# Patient Record
Sex: Female | Born: 1935 | Race: White | Hispanic: No | Smoking: Former smoker
Health system: Southern US, Community
[De-identification: ages and names within clinical notes are randomized; demographics above are authoritative.]

## PROBLEM LIST (undated history)

## (undated) DIAGNOSIS — C539 Malignant neoplasm of cervix uteri, unspecified: Secondary | ICD-10-CM

## (undated) DIAGNOSIS — I5032 Chronic diastolic (congestive) heart failure: Secondary | ICD-10-CM

## (undated) DIAGNOSIS — C50919 Malignant neoplasm of unspecified site of unspecified female breast: Secondary | ICD-10-CM

## (undated) DIAGNOSIS — K56609 Unspecified intestinal obstruction, unspecified as to partial versus complete obstruction: Secondary | ICD-10-CM

## (undated) DIAGNOSIS — C569 Malignant neoplasm of unspecified ovary: Secondary | ICD-10-CM

## (undated) DIAGNOSIS — Z90710 Acquired absence of both cervix and uterus: Secondary | ICD-10-CM

## (undated) DIAGNOSIS — Z8543 Personal history of malignant neoplasm of ovary: Secondary | ICD-10-CM

## (undated) DIAGNOSIS — E039 Hypothyroidism, unspecified: Secondary | ICD-10-CM

## (undated) DIAGNOSIS — C189 Malignant neoplasm of colon, unspecified: Secondary | ICD-10-CM

## (undated) DIAGNOSIS — I10 Essential (primary) hypertension: Secondary | ICD-10-CM

## (undated) DIAGNOSIS — J189 Pneumonia, unspecified organism: Secondary | ICD-10-CM

## (undated) DIAGNOSIS — I4821 Permanent atrial fibrillation: Secondary | ICD-10-CM

## (undated) DIAGNOSIS — C679 Malignant neoplasm of bladder, unspecified: Secondary | ICD-10-CM

## (undated) DIAGNOSIS — Z8551 Personal history of malignant neoplasm of bladder: Secondary | ICD-10-CM

## (undated) DIAGNOSIS — G4733 Obstructive sleep apnea (adult) (pediatric): Secondary | ICD-10-CM

## (undated) DIAGNOSIS — E785 Hyperlipidemia, unspecified: Secondary | ICD-10-CM

## (undated) DIAGNOSIS — E119 Type 2 diabetes mellitus without complications: Secondary | ICD-10-CM

## (undated) DIAGNOSIS — I872 Venous insufficiency (chronic) (peripheral): Secondary | ICD-10-CM

## (undated) DIAGNOSIS — Z8541 Personal history of malignant neoplasm of cervix uteri: Secondary | ICD-10-CM

## (undated) HISTORY — DX: Malignant neoplasm of cervix uteri, unspecified: C53.9

## (undated) HISTORY — PX: BLADDER SURGERY: SHX569

## (undated) HISTORY — DX: Type 2 diabetes mellitus without complications: E11.9

## (undated) HISTORY — PX: MASTECTOMY, RADICAL: SHX710

## (undated) HISTORY — PX: VAGINAL HYSTERECTOMY: SUR661

## (undated) HISTORY — PX: EXPLORATORY LAPAROTOMY WITH ABDOMINAL MASS EXCISION: SHX5169

## (undated) HISTORY — DX: Hypothyroidism, unspecified: E03.9

## (undated) HISTORY — PX: BREAST LUMPECTOMY: SHX2

## (undated) HISTORY — DX: Chronic diastolic (congestive) heart failure: I50.32

## (undated) HISTORY — DX: Hyperlipidemia, unspecified: E78.5

## (undated) HISTORY — PX: CATARACT EXTRACTION W/ INTRAOCULAR LENS  IMPLANT, BILATERAL: SHX1307

## (undated) HISTORY — PX: BREAST BIOPSY: SHX20

## (undated) HISTORY — DX: Malignant neoplasm of unspecified ovary: C56.9

## (undated) HISTORY — DX: Obstructive sleep apnea (adult) (pediatric): G47.33

## (undated) HISTORY — DX: Malignant neoplasm of bladder, unspecified: C67.9

## (undated) HISTORY — DX: Malignant neoplasm of unspecified site of unspecified female breast: C50.919

## (undated) HISTORY — PX: APPENDECTOMY: SHX54

## (undated) HISTORY — DX: Permanent atrial fibrillation: I48.21

## (undated) HISTORY — DX: Venous insufficiency (chronic) (peripheral): I87.2

---

## 1997-10-29 ENCOUNTER — Ambulatory Visit (HOSPITAL_COMMUNITY): Admission: RE | Admit: 1997-10-29 | Discharge: 1997-10-29 | Payer: Self-pay | Admitting: Family Medicine

## 1998-10-12 ENCOUNTER — Other Ambulatory Visit: Admission: RE | Admit: 1998-10-12 | Discharge: 1998-10-12 | Payer: Self-pay | Admitting: Family Medicine

## 1998-10-13 ENCOUNTER — Encounter (HOSPITAL_COMMUNITY): Admission: RE | Admit: 1998-10-13 | Discharge: 1999-01-11 | Payer: Self-pay | Admitting: Family Medicine

## 1998-10-19 ENCOUNTER — Ambulatory Visit (HOSPITAL_COMMUNITY): Admission: RE | Admit: 1998-10-19 | Discharge: 1998-10-19 | Payer: Self-pay | Admitting: Gastroenterology

## 1999-05-16 ENCOUNTER — Ambulatory Visit (HOSPITAL_COMMUNITY): Admission: RE | Admit: 1999-05-16 | Discharge: 1999-05-16 | Payer: Self-pay | Admitting: Gastroenterology

## 1999-11-16 ENCOUNTER — Encounter: Payer: Self-pay | Admitting: Family Medicine

## 1999-11-16 ENCOUNTER — Encounter: Admission: RE | Admit: 1999-11-16 | Discharge: 1999-11-16 | Payer: Self-pay | Admitting: Family Medicine

## 2001-01-09 ENCOUNTER — Encounter: Admission: RE | Admit: 2001-01-09 | Discharge: 2001-01-09 | Payer: Self-pay | Admitting: Family Medicine

## 2001-01-09 ENCOUNTER — Encounter: Payer: Self-pay | Admitting: Family Medicine

## 2003-12-27 ENCOUNTER — Encounter: Admission: RE | Admit: 2003-12-27 | Discharge: 2004-03-26 | Payer: Self-pay | Admitting: Family Medicine

## 2004-01-05 ENCOUNTER — Encounter: Admission: RE | Admit: 2004-01-05 | Discharge: 2004-01-05 | Payer: Self-pay | Admitting: Family Medicine

## 2004-10-28 ENCOUNTER — Ambulatory Visit: Admission: RE | Admit: 2004-10-28 | Discharge: 2004-10-28 | Payer: Self-pay | Admitting: Chiropractic Medicine

## 2005-06-27 ENCOUNTER — Ambulatory Visit (HOSPITAL_COMMUNITY): Admission: RE | Admit: 2005-06-27 | Discharge: 2005-06-27 | Payer: Self-pay | Admitting: Urology

## 2005-06-27 ENCOUNTER — Encounter (INDEPENDENT_AMBULATORY_CARE_PROVIDER_SITE_OTHER): Payer: Self-pay | Admitting: Specialist

## 2005-08-10 ENCOUNTER — Ambulatory Visit (HOSPITAL_BASED_OUTPATIENT_CLINIC_OR_DEPARTMENT_OTHER): Admission: RE | Admit: 2005-08-10 | Discharge: 2005-08-10 | Payer: Self-pay | Admitting: Urology

## 2005-08-10 ENCOUNTER — Encounter (INDEPENDENT_AMBULATORY_CARE_PROVIDER_SITE_OTHER): Payer: Self-pay | Admitting: *Deleted

## 2007-05-03 ENCOUNTER — Inpatient Hospital Stay (HOSPITAL_COMMUNITY): Admission: EM | Admit: 2007-05-03 | Discharge: 2007-05-14 | Payer: Self-pay | Admitting: Emergency Medicine

## 2007-05-07 ENCOUNTER — Encounter (INDEPENDENT_AMBULATORY_CARE_PROVIDER_SITE_OTHER): Payer: Self-pay | Admitting: Internal Medicine

## 2007-05-09 ENCOUNTER — Ambulatory Visit: Payer: Self-pay | Admitting: Physical Medicine & Rehabilitation

## 2007-09-02 ENCOUNTER — Emergency Department (HOSPITAL_COMMUNITY): Admission: EM | Admit: 2007-09-02 | Discharge: 2007-09-03 | Payer: Self-pay | Admitting: Emergency Medicine

## 2008-06-10 ENCOUNTER — Emergency Department (HOSPITAL_COMMUNITY): Admission: EM | Admit: 2008-06-10 | Discharge: 2008-06-10 | Payer: Self-pay | Admitting: Oncology

## 2009-09-08 ENCOUNTER — Inpatient Hospital Stay (HOSPITAL_COMMUNITY): Admission: EM | Admit: 2009-09-08 | Discharge: 2009-09-11 | Payer: Self-pay | Admitting: Emergency Medicine

## 2010-02-09 ENCOUNTER — Inpatient Hospital Stay (HOSPITAL_COMMUNITY): Admission: EM | Admit: 2010-02-09 | Discharge: 2010-02-14 | Payer: Self-pay | Admitting: Emergency Medicine

## 2010-09-15 LAB — URINALYSIS, ROUTINE W REFLEX MICROSCOPIC
Nitrite: NEGATIVE
Specific Gravity, Urine: 1.029 (ref 1.005–1.030)
Urobilinogen, UA: 1 mg/dL (ref 0.0–1.0)
pH: 5 (ref 5.0–8.0)

## 2010-09-15 LAB — CBC
HCT: 39 % (ref 36.0–46.0)
HCT: 39 % (ref 36.0–46.0)
Hemoglobin: 13.1 g/dL (ref 12.0–15.0)
Hemoglobin: 15 g/dL (ref 12.0–15.0)
MCH: 31.7 pg (ref 26.0–34.0)
MCH: 31.8 pg (ref 26.0–34.0)
MCHC: 33.5 g/dL (ref 30.0–36.0)
MCHC: 33.7 g/dL (ref 30.0–36.0)
MCV: 94.7 fL (ref 78.0–100.0)
Platelets: 152 10*3/uL (ref 150–400)
Platelets: 177 10*3/uL (ref 150–400)
Platelets: 186 10*3/uL (ref 150–400)
RBC: 4.05 MIL/uL (ref 3.87–5.11)
RBC: 4.16 MIL/uL (ref 3.87–5.11)
RBC: 4.7 MIL/uL (ref 3.87–5.11)
RDW: 15.6 % — ABNORMAL HIGH (ref 11.5–15.5)
RDW: 15.9 % — ABNORMAL HIGH (ref 11.5–15.5)
WBC: 10.7 10*3/uL — ABNORMAL HIGH (ref 4.0–10.5)
WBC: 5.9 10*3/uL (ref 4.0–10.5)
WBC: 6.4 10*3/uL (ref 4.0–10.5)

## 2010-09-15 LAB — DIFFERENTIAL
Eosinophils Absolute: 0 10*3/uL (ref 0.0–0.7)
Lymphocytes Relative: 7 % — ABNORMAL LOW (ref 12–46)
Lymphs Abs: 0.7 10*3/uL (ref 0.7–4.0)
Monocytes Relative: 2 % — ABNORMAL LOW (ref 3–12)
Neutro Abs: 9.7 10*3/uL — ABNORMAL HIGH (ref 1.7–7.7)
Neutrophils Relative %: 91 % — ABNORMAL HIGH (ref 43–77)

## 2010-09-15 LAB — PROTIME-INR
INR: 1.07 (ref 0.00–1.49)
Prothrombin Time: 14.1 seconds (ref 11.6–15.2)

## 2010-09-15 LAB — URINE MICROSCOPIC-ADD ON

## 2010-09-15 LAB — BASIC METABOLIC PANEL
BUN: 14 mg/dL (ref 6–23)
BUN: 7 mg/dL (ref 6–23)
CO2: 28 mEq/L (ref 19–32)
Calcium: 8.4 mg/dL (ref 8.4–10.5)
Chloride: 110 mEq/L (ref 96–112)
Creatinine, Ser: 0.85 mg/dL (ref 0.4–1.2)
GFR calc Af Amer: >60 mL/min (ref >60)
GFR calc Af Amer: >60 mL/min (ref >60)
GFR calc non Af Amer: >60 mL/min (ref >60)
GFR calc non Af Amer: >60 mL/min (ref >60)
Glucose, Bld: 119 mg/dL — ABNORMAL HIGH (ref 70–99)
Potassium: 3.6 mEq/L (ref 3.5–5.1)
Potassium: 4.3 mEq/L (ref 3.5–5.1)
Sodium: 140 mEq/L (ref 135–145)
Sodium: 144 mEq/L (ref 135–145)

## 2010-09-15 LAB — LACTIC ACID, PLASMA: Lactic Acid, Venous: 3.3 mmol/L — ABNORMAL HIGH (ref 0.5–2.2)

## 2010-09-15 LAB — GLUCOSE, CAPILLARY
Glucose-Capillary: 103 mg/dL — ABNORMAL HIGH (ref 70–99)
Glucose-Capillary: 103 mg/dL — ABNORMAL HIGH (ref 70–99)
Glucose-Capillary: 106 mg/dL — ABNORMAL HIGH (ref 70–99)
Glucose-Capillary: 107 mg/dL — ABNORMAL HIGH (ref 70–99)
Glucose-Capillary: 110 mg/dL — ABNORMAL HIGH (ref 70–99)
Glucose-Capillary: 111 mg/dL — ABNORMAL HIGH (ref 70–99)
Glucose-Capillary: 117 mg/dL — ABNORMAL HIGH (ref 70–99)
Glucose-Capillary: 117 mg/dL — ABNORMAL HIGH (ref 70–99)
Glucose-Capillary: 125 mg/dL — ABNORMAL HIGH (ref 70–99)
Glucose-Capillary: 129 mg/dL — ABNORMAL HIGH (ref 70–99)
Glucose-Capillary: 147 mg/dL — ABNORMAL HIGH (ref 70–99)
Glucose-Capillary: 148 mg/dL — ABNORMAL HIGH (ref 70–99)
Glucose-Capillary: 163 mg/dL — ABNORMAL HIGH (ref 70–99)

## 2010-09-15 LAB — COMPREHENSIVE METABOLIC PANEL
AST: 45 U/L — ABNORMAL HIGH (ref 0–37)
Albumin: 4.6 g/dL (ref 3.5–5.2)
BUN: 15 mg/dL (ref 6–23)
Chloride: 101 mEq/L (ref 96–112)
Creatinine, Ser: 1.01 mg/dL (ref 0.4–1.2)
GFR calc Af Amer: >60 mL/min (ref >60)
Total Bilirubin: 1.2 mg/dL (ref 0.3–1.2)
Total Protein: 7.7 g/dL (ref 6.0–8.3)

## 2010-09-15 LAB — URINE CULTURE: Colony Count: NO GROWTH

## 2010-09-15 LAB — CREATININE, SERUM: GFR calc Af Amer: >60 mL/min (ref >60)

## 2010-09-15 LAB — MAGNESIUM: Magnesium: 1.8 mg/dL (ref 1.5–2.5)

## 2010-09-15 LAB — APTT: aPTT: 33 seconds (ref 24–37)

## 2010-09-15 LAB — TSH: TSH: 3.549 u[IU]/mL (ref 0.350–4.500)

## 2010-09-24 LAB — URINALYSIS, ROUTINE W REFLEX MICROSCOPIC
Protein, ur: >300 mg/dL — AB
Urobilinogen, UA: 0.2 mg/dL (ref 0.0–1.0)

## 2010-09-24 LAB — BASIC METABOLIC PANEL
BUN: 11 mg/dL (ref 6–23)
BUN: 7 mg/dL (ref 6–23)
CO2: 27 mEq/L (ref 19–32)
CO2: 28 mEq/L (ref 19–32)
CO2: 29 mEq/L (ref 19–32)
Calcium: 8.7 mg/dL (ref 8.4–10.5)
Calcium: 8.8 mg/dL (ref 8.4–10.5)
Calcium: 9 mg/dL (ref 8.4–10.5)
Chloride: 103 mEq/L (ref 96–112)
Chloride: 109 mEq/L (ref 96–112)
Creatinine, Ser: 0.57 mg/dL (ref 0.4–1.2)
Creatinine, Ser: 0.8 mg/dL (ref 0.4–1.2)
Creatinine, Ser: 0.82 mg/dL (ref 0.4–1.2)
GFR calc Af Amer: >60 mL/min (ref >60)
GFR calc Af Amer: >60 mL/min (ref >60)
Glucose, Bld: 108 mg/dL — ABNORMAL HIGH (ref 70–99)
Glucose, Bld: 112 mg/dL — ABNORMAL HIGH (ref 70–99)
Sodium: 146 mEq/L — ABNORMAL HIGH (ref 135–145)

## 2010-09-24 LAB — COMPREHENSIVE METABOLIC PANEL
ALT: 18 U/L (ref 0–35)
ALT: 22 U/L (ref 0–35)
AST: 33 U/L (ref 0–37)
Albumin: 4.2 g/dL (ref 3.5–5.2)
Alkaline Phosphatase: 55 U/L (ref 39–117)
CO2: 29 mEq/L (ref 19–32)
Calcium: 9.3 mg/dL (ref 8.4–10.5)
GFR calc Af Amer: >60 mL/min (ref >60)
GFR calc non Af Amer: >60 mL/min (ref >60)
Glucose, Bld: 112 mg/dL — ABNORMAL HIGH (ref 70–99)
Glucose, Bld: 158 mg/dL — ABNORMAL HIGH (ref 70–99)
Potassium: 3.8 mEq/L (ref 3.5–5.1)
Sodium: 137 mEq/L (ref 135–145)
Sodium: 140 mEq/L (ref 135–145)
Total Bilirubin: 1 mg/dL (ref 0.3–1.2)
Total Protein: 7.3 g/dL (ref 6.0–8.3)

## 2010-09-24 LAB — DIFFERENTIAL
Basophils Relative: 0 % (ref 0–1)
Eosinophils Absolute: 0 10*3/uL (ref 0.0–0.7)
Eosinophils Absolute: 0 10*3/uL (ref 0.0–0.7)
Lymphs Abs: 0.5 10*3/uL — ABNORMAL LOW (ref 0.7–4.0)
Monocytes Absolute: 0.4 10*3/uL (ref 0.1–1.0)
Monocytes Relative: 3 % (ref 3–12)
Neutrophils Relative %: 77 % (ref 43–77)
Neutrophils Relative %: 93 % — ABNORMAL HIGH (ref 43–77)

## 2010-09-24 LAB — URINE MICROSCOPIC-ADD ON

## 2010-09-24 LAB — MRSA PCR SCREENING: MRSA by PCR: NEGATIVE

## 2010-09-24 LAB — LACTIC ACID, PLASMA: Lactic Acid, Venous: 2.8 mmol/L — ABNORMAL HIGH (ref 0.5–2.2)

## 2010-09-24 LAB — HEMOGLOBIN A1C
Hgb A1c MFr Bld: 6.1 % (ref 4.6–6.1)
Mean Plasma Glucose: 128 mg/dL

## 2010-09-24 LAB — CBC
Hemoglobin: 13.5 g/dL (ref 12.0–15.0)
Hemoglobin: 14.5 g/dL (ref 12.0–15.0)
MCHC: 34.1 g/dL (ref 30.0–36.0)
MCHC: 34.1 g/dL (ref 30.0–36.0)
MCHC: 34.2 g/dL (ref 30.0–36.0)
MCHC: 34.4 g/dL (ref 30.0–36.0)
MCV: 92.9 fL (ref 78.0–100.0)
Platelets: 142 10*3/uL — ABNORMAL LOW (ref 150–400)
Platelets: 233 10*3/uL (ref 150–400)
RBC: 4.59 MIL/uL (ref 3.87–5.11)
RBC: 4.69 MIL/uL (ref 3.87–5.11)
RDW: 14 % (ref 11.5–15.5)
RDW: 14.2 % (ref 11.5–15.5)
RDW: 14.4 % (ref 11.5–15.5)

## 2010-09-24 LAB — GLUCOSE, CAPILLARY
Glucose-Capillary: 107 mg/dL — ABNORMAL HIGH (ref 70–99)
Glucose-Capillary: 109 mg/dL — ABNORMAL HIGH (ref 70–99)
Glucose-Capillary: 113 mg/dL — ABNORMAL HIGH (ref 70–99)
Glucose-Capillary: 162 mg/dL — ABNORMAL HIGH (ref 70–99)

## 2010-09-24 LAB — LIPID PANEL
HDL: 41 mg/dL (ref >39)
Triglycerides: 137 mg/dL (ref <150)
VLDL: 27 mg/dL (ref 0–40)

## 2010-11-03 ENCOUNTER — Inpatient Hospital Stay (HOSPITAL_COMMUNITY)
Admission: EM | Admit: 2010-11-03 | Discharge: 2010-11-11 | DRG: 308 | Disposition: A | Discharge status: Home Health | Payer: Medicare Other | Source: Other Acute Inpatient Hospital | Attending: Internal Medicine | Admitting: Internal Medicine

## 2010-11-03 ENCOUNTER — Emergency Department (HOSPITAL_COMMUNITY): Payer: Medicare Other

## 2010-11-03 ENCOUNTER — Emergency Department (HOSPITAL_COMMUNITY)
Admission: EM | Admit: 2010-11-03 | Discharge: 2010-11-03 | Disposition: A | Discharge status: Other Acute Inpatient Hospital | Payer: Medicare Other | Source: Home / Self Care | Attending: Emergency Medicine | Admitting: Emergency Medicine

## 2010-11-03 DIAGNOSIS — I498 Other specified cardiac arrhythmias: Secondary | ICD-10-CM | POA: Diagnosis present

## 2010-11-03 DIAGNOSIS — E119 Type 2 diabetes mellitus without complications: Secondary | ICD-10-CM | POA: Diagnosis present

## 2010-11-03 DIAGNOSIS — Z8543 Personal history of malignant neoplasm of ovary: Secondary | ICD-10-CM

## 2010-11-03 DIAGNOSIS — J811 Chronic pulmonary edema: Secondary | ICD-10-CM | POA: Insufficient documentation

## 2010-11-03 DIAGNOSIS — Z85038 Personal history of other malignant neoplasm of large intestine: Secondary | ICD-10-CM | POA: Insufficient documentation

## 2010-11-03 DIAGNOSIS — Z853 Personal history of malignant neoplasm of breast: Secondary | ICD-10-CM | POA: Insufficient documentation

## 2010-11-03 DIAGNOSIS — I10 Essential (primary) hypertension: Secondary | ICD-10-CM | POA: Diagnosis present

## 2010-11-03 DIAGNOSIS — I4891 Unspecified atrial fibrillation: Principal | ICD-10-CM | POA: Diagnosis present

## 2010-11-03 DIAGNOSIS — E039 Hypothyroidism, unspecified: Secondary | ICD-10-CM | POA: Diagnosis present

## 2010-11-03 DIAGNOSIS — R079 Chest pain, unspecified: Secondary | ICD-10-CM

## 2010-11-03 DIAGNOSIS — Z6841 Body Mass Index (BMI) 40.0 and over, adult: Secondary | ICD-10-CM

## 2010-11-03 DIAGNOSIS — Z8541 Personal history of malignant neoplasm of cervix uteri: Secondary | ICD-10-CM

## 2010-11-03 DIAGNOSIS — I509 Heart failure, unspecified: Secondary | ICD-10-CM | POA: Diagnosis present

## 2010-11-03 DIAGNOSIS — R0682 Tachypnea, not elsewhere classified: Secondary | ICD-10-CM | POA: Insufficient documentation

## 2010-11-03 DIAGNOSIS — I5031 Acute diastolic (congestive) heart failure: Secondary | ICD-10-CM | POA: Diagnosis present

## 2010-11-03 DIAGNOSIS — G4733 Obstructive sleep apnea (adult) (pediatric): Secondary | ICD-10-CM | POA: Diagnosis present

## 2010-11-03 LAB — CBC
HCT: 49 % — ABNORMAL HIGH (ref 36.0–46.0)
RDW: 15.3 % (ref 11.5–15.5)
WBC: 8.8 10*3/uL (ref 4.0–10.5)

## 2010-11-03 LAB — DIFFERENTIAL
Basophils Absolute: 0 10*3/uL (ref 0.0–0.1)
Eosinophils Relative: 1 % (ref 0–5)
Lymphocytes Relative: 17 % (ref 12–46)
Neutro Abs: 6.6 10*3/uL (ref 1.7–7.7)

## 2010-11-03 LAB — URINALYSIS, ROUTINE W REFLEX MICROSCOPIC
Bilirubin Urine: NEGATIVE
Ketones, ur: NEGATIVE mg/dL
Nitrite: NEGATIVE
Protein, ur: 100 mg/dL — AB
Specific Gravity, Urine: 1.012 (ref 1.005–1.030)
Urobilinogen, UA: 0.2 mg/dL (ref 0.0–1.0)

## 2010-11-03 LAB — PRO B NATRIURETIC PEPTIDE: Pro B Natriuretic peptide (BNP): 1391 pg/mL — ABNORMAL HIGH (ref 0–450)

## 2010-11-03 LAB — BASIC METABOLIC PANEL
GFR calc non Af Amer: >60 mL/min (ref >60)
Glucose, Bld: 108 mg/dL — ABNORMAL HIGH (ref 70–99)
Potassium: 3.4 mEq/L — ABNORMAL LOW (ref 3.5–5.1)
Sodium: 142 mEq/L (ref 135–145)

## 2010-11-03 LAB — CK TOTAL AND CKMB (NOT AT ARMC)
CK, MB: 2.9 ng/mL (ref 0.3–4.0)
Relative Index: INVALID (ref 0.0–2.5)
Total CK: 94 U/L (ref 7–177)

## 2010-11-03 LAB — TROPONIN I: Troponin I: <0.3 ng/mL (ref <0.30)

## 2010-11-04 ENCOUNTER — Inpatient Hospital Stay (HOSPITAL_COMMUNITY): Payer: Medicare Other

## 2010-11-04 LAB — COMPREHENSIVE METABOLIC PANEL
AST: 25 U/L (ref 0–37)
Alkaline Phosphatase: 62 U/L (ref 39–117)
BUN: 11 mg/dL (ref 6–23)
CO2: 32 mEq/L (ref 19–32)
Chloride: 100 mEq/L (ref 96–112)
Creatinine, Ser: 0.57 mg/dL (ref 0.4–1.2)
GFR calc Af Amer: >60 mL/min (ref >60)
GFR calc non Af Amer: >60 mL/min (ref >60)
Potassium: 3.4 mEq/L — ABNORMAL LOW (ref 3.5–5.1)
Total Bilirubin: 1 mg/dL (ref 0.3–1.2)

## 2010-11-04 LAB — PROTIME-INR
INR: 1.18 (ref 0.00–1.49)
Prothrombin Time: 15.2 seconds (ref 11.6–15.2)

## 2010-11-04 LAB — GLUCOSE, CAPILLARY
Glucose-Capillary: 118 mg/dL — ABNORMAL HIGH (ref 70–99)
Glucose-Capillary: 134 mg/dL — ABNORMAL HIGH (ref 70–99)

## 2010-11-04 LAB — CARDIAC PANEL(CRET KIN+CKTOT+MB+TROPI)
CK, MB: 3.3 ng/mL (ref 0.3–4.0)
CK, MB: 3.4 ng/mL (ref 0.3–4.0)
Total CK: 91 U/L (ref 7–177)
Total CK: 107 U/L (ref 7–177)
Troponin I: <0.3 ng/mL (ref <0.30)
Troponin I: <0.3 ng/mL (ref <0.30)

## 2010-11-04 LAB — CBC
HCT: 41 % (ref 36.0–46.0)
Hemoglobin: 13.3 g/dL (ref 12.0–15.0)
Hemoglobin: 13.6 g/dL (ref 12.0–15.0)
MCHC: 33.2 g/dL (ref 30.0–36.0)
Platelets: 175 10*3/uL (ref 150–400)
RBC: 4.34 MIL/uL (ref 3.87–5.11)
RBC: 4.42 MIL/uL (ref 3.87–5.11)

## 2010-11-04 LAB — MRSA PCR SCREENING: MRSA by PCR: NEGATIVE

## 2010-11-04 LAB — HEMOGLOBIN A1C: Mean Plasma Glucose: 126 mg/dL — ABNORMAL HIGH (ref <117)

## 2010-11-04 LAB — TSH: TSH: 7.951 u[IU]/mL — ABNORMAL HIGH (ref 0.350–4.500)

## 2010-11-04 LAB — LIPID PANEL
Cholesterol: 173 mg/dL (ref 0–200)
Total CHOL/HDL Ratio: 3.8 RATIO
VLDL: 20 mg/dL (ref 0–40)

## 2010-11-04 LAB — HEPARIN LEVEL (UNFRACTIONATED): Heparin Unfractionated: 0.1 IU/mL — ABNORMAL LOW (ref 0.30–0.70)

## 2010-11-05 DIAGNOSIS — I4891 Unspecified atrial fibrillation: Secondary | ICD-10-CM

## 2010-11-05 DIAGNOSIS — I319 Disease of pericardium, unspecified: Secondary | ICD-10-CM

## 2010-11-05 LAB — CK TOTAL AND CKMB (NOT AT ARMC)
CK, MB: 1.5 ng/mL (ref 0.3–4.0)
Relative Index: INVALID (ref 0.0–2.5)
Total CK: 71 U/L (ref 7–177)

## 2010-11-05 LAB — BASIC METABOLIC PANEL
Calcium: 8.8 mg/dL (ref 8.4–10.5)
Creatinine, Ser: 0.75 mg/dL (ref 0.4–1.2)
GFR calc Af Amer: >60 mL/min (ref >60)
GFR calc non Af Amer: >60 mL/min (ref >60)

## 2010-11-05 LAB — GLUCOSE, CAPILLARY
Glucose-Capillary: 136 mg/dL — ABNORMAL HIGH (ref 70–99)
Glucose-Capillary: 126 mg/dL — ABNORMAL HIGH (ref 70–99)
Glucose-Capillary: 129 mg/dL — ABNORMAL HIGH (ref 70–99)

## 2010-11-05 LAB — CBC
HCT: 39 % (ref 36.0–46.0)
Hemoglobin: 12.8 g/dL (ref 12.0–15.0)
WBC: 8.7 10*3/uL (ref 4.0–10.5)

## 2010-11-05 LAB — TROPONIN I: Troponin I: <0.3 ng/mL (ref <0.30)

## 2010-11-05 LAB — URINE CULTURE: Colony Count: NO GROWTH

## 2010-11-05 LAB — HEPARIN LEVEL (UNFRACTIONATED): Heparin Unfractionated: 0.63 IU/mL (ref 0.30–0.70)

## 2010-11-06 LAB — CBC
HCT: 46.6 % — ABNORMAL HIGH (ref 36.0–46.0)
MCV: 94.5 fL (ref 78.0–100.0)
RDW: 15.5 % (ref 11.5–15.5)
WBC: 8.3 10*3/uL (ref 4.0–10.5)

## 2010-11-06 LAB — BASIC METABOLIC PANEL WITH GFR
BUN: 20 mg/dL (ref 6–23)
CO2: 36 meq/L — ABNORMAL HIGH (ref 19–32)
Calcium: 10 mg/dL (ref 8.4–10.5)
Chloride: 100 meq/L (ref 96–112)
Creatinine, Ser: 0.85 mg/dL (ref 0.4–1.2)
GFR calc non Af Amer: >60 mL/min
Glucose, Bld: 145 mg/dL — ABNORMAL HIGH (ref 70–99)
Potassium: 4.4 meq/L (ref 3.5–5.1)
Sodium: 141 meq/L (ref 135–145)

## 2010-11-06 LAB — PROTIME-INR
INR: 1.17 (ref 0.00–1.49)
INR: 1.15 (ref 0.00–1.49)
Prothrombin Time: 15.1 s (ref 11.6–15.2)
Prothrombin Time: 14.9 s (ref 11.6–15.2)

## 2010-11-06 LAB — HEPARIN LEVEL (UNFRACTIONATED)

## 2010-11-06 LAB — GLUCOSE, CAPILLARY
Glucose-Capillary: 120 mg/dL — ABNORMAL HIGH (ref 70–99)
Glucose-Capillary: 100 mg/dL — ABNORMAL HIGH (ref 70–99)
Glucose-Capillary: 118 mg/dL — ABNORMAL HIGH (ref 70–99)

## 2010-11-07 LAB — BASIC METABOLIC PANEL
BUN: 21 mg/dL (ref 6–23)
Calcium: 9.9 mg/dL (ref 8.4–10.5)
GFR calc non Af Amer: >60 mL/min (ref >60)
Potassium: 4.4 mEq/L (ref 3.5–5.1)
Sodium: 139 mEq/L (ref 135–145)

## 2010-11-07 LAB — HEPARIN LEVEL (UNFRACTIONATED): Heparin Unfractionated: 0.5 IU/mL (ref 0.30–0.70)

## 2010-11-07 LAB — CBC
MCV: 93.4 fL (ref 78.0–100.0)
RBC: 4.98 MIL/uL (ref 3.87–5.11)
RDW: 15.4 % (ref 11.5–15.5)

## 2010-11-07 LAB — GLUCOSE, CAPILLARY: Glucose-Capillary: 109 mg/dL — ABNORMAL HIGH (ref 70–99)

## 2010-11-07 LAB — PROTIME-INR
INR: 1.27 (ref 0.00–1.49)
Prothrombin Time: 16.1 seconds — ABNORMAL HIGH (ref 11.6–15.2)

## 2010-11-08 LAB — BASIC METABOLIC PANEL
CO2: 33 mEq/L — ABNORMAL HIGH (ref 19–32)
Chloride: 99 mEq/L (ref 96–112)
GFR calc Af Amer: >60 mL/min (ref >60)
Potassium: 4.8 mEq/L (ref 3.5–5.1)
Sodium: 140 mEq/L (ref 135–145)

## 2010-11-08 LAB — CBC
HCT: 47.9 % — ABNORMAL HIGH (ref 36.0–46.0)
MCH: 31.2 pg (ref 26.0–34.0)
MCV: 92.8 fL (ref 78.0–100.0)
Platelets: 179 10*3/uL (ref 150–400)
RBC: 5.16 MIL/uL — ABNORMAL HIGH (ref 3.87–5.11)
WBC: 6.7 10*3/uL (ref 4.0–10.5)

## 2010-11-08 LAB — GLUCOSE, CAPILLARY
Glucose-Capillary: 107 mg/dL — ABNORMAL HIGH (ref 70–99)
Glucose-Capillary: 173 mg/dL — ABNORMAL HIGH (ref 70–99)
Glucose-Capillary: 87 mg/dL (ref 70–99)

## 2010-11-08 LAB — PROTIME-INR: INR: 1.43 (ref 0.00–1.49)

## 2010-11-09 LAB — BASIC METABOLIC PANEL
BUN: 22 mg/dL (ref 6–23)
Creatinine, Ser: 0.83 mg/dL (ref 0.4–1.2)
GFR calc non Af Amer: >60 mL/min (ref >60)
Glucose, Bld: 114 mg/dL — ABNORMAL HIGH (ref 70–99)
Potassium: 5.1 mEq/L (ref 3.5–5.1)

## 2010-11-09 LAB — GLUCOSE, CAPILLARY
Glucose-Capillary: 118 mg/dL — ABNORMAL HIGH (ref 70–99)
Glucose-Capillary: 107 mg/dL — ABNORMAL HIGH (ref 70–99)
Glucose-Capillary: 159 mg/dL — ABNORMAL HIGH (ref 70–99)
Glucose-Capillary: 95 mg/dL (ref 70–99)

## 2010-11-09 LAB — CBC
MCH: 30.6 pg (ref 26.0–34.0)
MCHC: 32.9 g/dL (ref 30.0–36.0)
MCV: 93 fL (ref 78.0–100.0)
Platelets: 192 10*3/uL (ref 150–400)

## 2010-11-09 LAB — HEPARIN LEVEL (UNFRACTIONATED): Heparin Unfractionated: 0.7 IU/mL (ref 0.30–0.70)

## 2010-11-09 LAB — PROTIME-INR: INR: 1.87 — ABNORMAL HIGH (ref 0.00–1.49)

## 2010-11-10 LAB — GLUCOSE, CAPILLARY
Glucose-Capillary: 152 mg/dL — ABNORMAL HIGH (ref 70–99)
Glucose-Capillary: 107 mg/dL — ABNORMAL HIGH (ref 70–99)

## 2010-11-10 LAB — BASIC METABOLIC PANEL
BUN: 23 mg/dL (ref 6–23)
CO2: 31 mEq/L (ref 19–32)
Calcium: 10.1 mg/dL (ref 8.4–10.5)
Glucose, Bld: 111 mg/dL — ABNORMAL HIGH (ref 70–99)
Sodium: 140 mEq/L (ref 135–145)

## 2010-11-10 LAB — CBC
HCT: 45.6 % (ref 36.0–46.0)
Hemoglobin: 15.3 g/dL — ABNORMAL HIGH (ref 12.0–15.0)
MCHC: 33.6 g/dL (ref 30.0–36.0)
MCV: 92.9 fL (ref 78.0–100.0)

## 2010-11-10 LAB — HEPARIN LEVEL (UNFRACTIONATED): Heparin Unfractionated: 0.68 IU/mL (ref 0.30–0.70)

## 2010-11-11 LAB — GLUCOSE, CAPILLARY: Glucose-Capillary: 86 mg/dL (ref 70–99)

## 2010-11-11 LAB — CBC
Hemoglobin: 15.1 g/dL — ABNORMAL HIGH (ref 12.0–15.0)
MCV: 92.9 fL (ref 78.0–100.0)
Platelets: 183 10*3/uL (ref 150–400)
RBC: 4.96 MIL/uL (ref 3.87–5.11)
WBC: 6.4 10*3/uL (ref 4.0–10.5)

## 2010-11-11 LAB — PROTIME-INR: INR: 2.37 — ABNORMAL HIGH (ref 0.00–1.49)

## 2010-11-11 LAB — HEPARIN LEVEL (UNFRACTIONATED): Heparin Unfractionated: 0.62 IU/mL (ref 0.30–0.70)

## 2010-11-13 ENCOUNTER — Telehealth: Payer: Self-pay | Admitting: Internal Medicine

## 2010-11-13 NOTE — Telephone Encounter (Signed)
[name] called pt and per pt she is going to [place] for coumadin check

## 2010-11-13 NOTE — Telephone Encounter (Signed)
Per night message pt need needs to be called to confirm she went to get INR done with pcp

## 2010-11-13 NOTE — Telephone Encounter (Signed)
Left message for pt to call back  °

## 2010-11-14 NOTE — Discharge Summary (Signed)
[name], [name]               ACCOUNT NO.:  [id number]   MEDICAL RECORD NO.:  [id number]          PATIENT TYPE:  INP   LOCATION:  2012                         FACILITY:  [facility]   PHYSICIAN:  Kela Millin, M.D.DATE OF BIRTH:  1919/08/04   DATE OF ADMISSION:  05/03/2007  DATE OF DISCHARGE:  05/14/2007                               DISCHARGE SUMMARY   DISCHARGE DIAGNOSES:  1. Urinary tract infection.  2. Sepsis, secondary to #1.  3. Acute renal failure - resolved - her last BUN and creatinine prior      to discharge 13 and 1.16.  4. Paroxysmal atrial fibrillation, new onset - rate controlled on      Cardizem, not a Coumadin candidate secondary to history of falls.  5. Type 2 diabetes with neuropathy.  6. Hypertension.  7. Hypothyroidism.  8. History of anemia and iron replacement.  9. History of stage III bladder cancer - status post a check in      February 2007.  10.Obstructive sleep apnea.  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  Appointment is to be      scheduled upon calling [phone] prior discharge as ordered on paper      chart for this appointment for outpatient follow-up.  12.Volume overload - resolved.   PROCEDURE:  1. CT scan of abdomen - no acute abdominal findings.      Hepatosplenomegaly.  No acute pelvic findings.  2. CT scan of head - mass in the region of the right cerebella pontine      angle possibly meningioma measuring 2 cm in diameter, consider      cerebellar evaluation with MRI for better characterization.      Atrophy and small vessel disease.  3. MRI of brain - constellation of findings consistent with benign,      cerebellar pontine angle meningioma with associated edema measuring      14.6 x 20.6 x 17.6 mm.  Evidence of  white matter small vessel      ischemic disease, no acute intracranial abnormality.  4. Renal ultrasound - no acute abnormality.  There are several  cysts      on the right kidney, benign appearing.  5. Two-dimensional echocardiogram - overall left ventricular systolic      function vigorous and left ventricular systolic ejection fraction      estimated to be 75%.  No diagnostic evidence of left ventricular      regional wall motion abnormality.  Features are consistent with      pseudonormal left ventricular filling pattern with concomitant      abnormal relaxation and increased filling pressure.   BRIEF HISTORY:  The patient is an 77 year old white female with above  listed medical problems, who presented with complaints of decreased p.o.  intake as well as vomiting, polyuria and dysuria beginning the night  prior to admission.  It was also reported that on the morning of  admission, the patient slipped while trying to get out of bed to go to  the  bathroom.  She felt very weak, was short of breath, and had some  periods of confusion/somnolence with questionable slurred speech per  family.  No facial symmetry, no numbness, no tingling, no asymmetric  weakness and the patient denied chest pain, palpitations, wheezes.  In  the ER, the patient was found to have an elevated white blood cell  count, and also a fever and urinalysis consistent with a urinary tract  infection.  She was started on IV fluids and admitted for further  evaluation and management.   Please see the full admission history and physical dictated on May 03, 2007, for the details of the admission physical examination as well  as laboratory data.   HOSPITAL COURSE:  Problem 1.  Escherichia coli urinary tract infection  with sepsis syndrome.  The patient was admitted to the stepdown unit as  she was hypotensive with a UTI.  The patient was pan cultured and  started on empiric broad spectrum antibiotics.  Blood cultures did not  grow any bacterium.  The urine cultures grew Escherichia coli pan  sensitive.  The patient's antibiotics was switched to Cipro.  Her   hypotension resolved and she defervesced and has remained afebrile.  Her  leukocytosis resolved.  Her last white blood cell count prior to  discharge to 7.5.   Problem 2.  Acute renal failure.  It was thought that this was likely  prerenal and with hydration, this improved.  She had a renal ultrasound  and the results are stated above.  Her last BUN and creatinine are 13  and 1.16.   Problem 3.  Paroxysmal atrial fibrillation.  While in the stepdown unit,  the patient went into atrial fibrillation with rapid ventricular  response.  Serial cardiac enzymes were done and these were negative for  myocardial infarction.  The patient was started on Lovenox and also IV  Cardizem.  While on IV Cardizem she spontaneously converted to normal  sinus rhythm.  She was switched to p.o. Cardizem.  I discussed with the  family the patient's history of falls and also the risks and benefits of  anticoagulation and it was decided that she was not a good Coumadin  candidate.  She was on Lovenox therapeutic dose in the hospital and this  was subsequently changed to prophylactic Lovenox (DVT prophylaxis).  Once the patient's rate was controlled with Cardizem, she was  transferred to a telemetry bed.  She has been monitored throughout her  hospital stay and intermittently goes into atrial fibrillation from  normal sinus rhythm, but the rate has remained controlled on oral  Cardizem.   Problem 4.  Hypertension.  Initially the patient's outpatient  antihypertensive medications were held because of the hypotension on  admission.  As she improved clinically the hypotension resolved.  The  patient was placed on oral Cardizem as discussed above, but which also  controlled her blood pressure and so her Altace only was resumed at once  daily dose and hydrochlorothiazide at a low dose of 12.5.  Her blood  pressures have been optimally controlled on this regimen and she is to  follow up with the nursing home  physician/primary care physician upon  discharge.   Problem 5.  Volume overload/diastolic congestive heart failure.  The  patient developed shortness of breath following hydration.  The chest x-  ray was consistent with pulmonary edema.  Cardiac enzymes done were  negative for an MI.  Brain natriuretic peptide was done and it was  mildly elevated at 227.  A two-dimensional echocardiogram was done and  it was consistent with diastolic CHF, as noted above her ejection  fraction was 75%.  She was diuresed with IV Lasix while in the hospital,  her symptoms resolved and she has not had any further shortness of  breath.  She was then discharged on the hydrochlorothiazide and her  fluid status to be monitored and diuretics dosed as appropriate.   Problem 6.  Incidental benign meningioma.  Her MRI as above, the patient  is to have her outpatient follow-up appointment with neurosurgery  scheduled prior to her discharge from the hospital.  The number to call  is [phone].   Problem 7.  History of falls.  Physical therapy was consulted and  followed the patient during her hospital stay.  Skilled nursing has been  recommended for her safety - further rehabilitation and social worker is  assisting with her placement.   DISCHARGE MEDICATIONS:  1. Cipro 250 mg p.o. b.i.d. through May 15, 2007.  2. Cardizem 120 mg p.o. daily.  3. Iron sulfate 325 mg p.o. b.i.d.  4. Colace 100 mg p.o. daily.  5. Hydrochlorothiazide 12.5 mg p.o. daily.  6. NovoLog sliding scale insulin.  7. Levothyroxine 200 mcg p.o. daily.  8. Protonix 40 mg p.o. daily.  9. Altace 10 mg p.o. daily.  10.Phenergan 12.5 mg p.o./p.r. q.6 hours p.r.n.  11.Tylenol 650 mg p.o. q.6 hours p.r.n.  12.Micardis has been discontinued.  13.Altace changed to once a day as above from her preadmission b.i.d.      dosing.  14.Triamterene discontinued.  15.Aspirin 325 mg p.o. daily.   FOLLOWUP:  Neurosurgery outpatient follow-up  appointment to be scheduled  at [phone].  Medicine home physician in 1-2 days.  Primary care  physician as scheduled.   CONDITION ON DISCHARGE:  Improved/stable.      Kela Millin, M.D.  Electronically Signed     ACV/MEDQ  D:  05/13/2007  T:  05/14/2007  Job:  [id number]   cc:   Caryn Bee L. Little, M.D.

## 2010-11-14 NOTE — H&P (Signed)
[name], [name]:  [id number]   MEDICAL RECORD NO.:  [id number]          PATIENT TYPE:  INP   LOCATION:  1830                         FACILITY:  [facility]   PHYSICIAN:  Ramiro Harvest, MD    DATE OF BIRTH:  1920-06-13   DATE OF ADMISSION:  05/03/2007  DATE OF DISCHARGE:                              HISTORY & PHYSICAL   PRIMARY CARE PHYSICIAN:  Dr. Clarene Duke   HISTORY OF PRESENT ILLNESS:  Anyia [name] is an 77 year old white  female with history of hypertension, type 2 diabetes diet controlled,  hypothyroidism, who presents to the ED with generalize weakness and  hypotension.  Per family, patient had had some decreased p.o. intake and  emesis x2 as well as polyuria and dysuria the night prior to admission.  The morning of admission, patient slipped while trying to get out of bed  to go to the bathroom.  Felt very weak, was short of breath and had some  periods of incoherence/falling asleep with some form of slurred speech  per family.  No facial asymmetry, no numbness, no tingling, no  asymmetric weakness, no chest pain, no palpitations, no wheezes, no  other associated symptoms.  Patient was brought to the ED where it was  found that patient had a high white count and also had a fever and a  UTI.  Patient was fluid resuscitated and we were called to admit the  patient.   ALLERGIES:  ARE MORPHINE, CODEINE LEADS TO EMESIS.   PAST MEDICAL HISTORY:  1. Hypothyroidism.  2. Hypertension.  3. Diet-controlled type 2 diabetes with some neuropathy.  4. Anemia on iron replacement therapy.  5. Stage III bladder cancer status post TURP in February of 2007.  6. Obstructive sleep apnea.   MEDICATIONS:  1. Levothyroxine 0.2 mg daily.  2. Altace 10 mg b.i.d.  3. Micardis 40 mg daily.  4. Iron sulfate 325 mg b.i.d.  5. Maxzide 37.5/25 mg daily.   FAMILY HISTORY:  Mother deceased at age 31 from kidney disease and  diabetes; father deceased at age 27 from old age.   Patient has four  brothers and four sisters and medical history is significant for chronic  renal failure secondary to diabetes mellitus.   SOCIAL HISTORY:  Patient lives in [place] with her daughter.  She is  widowed.  Denies any current tobacco use, however quit in 1968, prior to  that had a 60-pack-year history, no alcohol abuse, no IV drug abuse.   REVIEW OF SYSTEMS:  Per HPI.  GENERAL:  Positive for fever, no chills,  no weight loss, positive fatigue, no recent travel, no night sweats.  HEENT:  No nosebleeds, no sore throat, no mouth pain.  CARDIOVASCULAR:  No chest pain, no palpitations, no cough, positive shortness of breath,  no paroxysmal nocturnal dyspnea, no syncope, no edema.  RESPIRATORY:  No  cough, no hemoptysis, no wheezes, positive shortness of breath.  GI:  No  nausea, positive for emesis, no diarrhea, no constipation, no melena, no  hematemesis, no hematochezia.  GU:  Positive for dysuria, positive for  polyuria, no hematuria,  no flank pain.  NEUROLOGIC:  Generalized  weakness, no numbness, no headache, no visual changes.   PHYSICAL EXAMINATION:  VITAL SIGNS:  Temperature 101.1, pulse of 75,  blood pressure 97/34 which dropped down to 88/65, sating 94% on two  liters nasal cannula.  GENERAL:  Patient is an obese, pleasant elderly female.  HEENT:  Normocephalic, atraumatic.  Pupils equal, round and reactive to  light.  Extraocular movements are intact.  Upper eyelids with chronic  ptosis.  NECK:  Supple.  No lymphadenopathy.  RESPIRATORY:  Lungs are clear to auscultation bilaterally.  No wheezes.  No rhonchi.  CARDIOVASCULAR:  Regular rate and rhythm.  No murmurs, rubs or gallops.  ABDOMEN:  Soft, obese, nontender, chronically distended, probably  hepatosplenomegaly.  No rebound or no guarding.  EXTREMITIES:  No clubbing, cyanosis or edema.  NEUROLOGIC EXAM:  Patient is alert and oriented x3.  Cranial nerves II-  XII are grossly intact.  No focal deficits.   Sensation is intact.  Cerebellum is intact.  Reflexes unable to obtain symmetrically.  Gait  was not tested.   Lab wise:  White count 16.7, hemoglobin is 15.2, platelets 212, ALT  15.6, sodium 134, potassium 3.4, chloride 98, bicarb 25, BUN 15,  creatinine 1.35, glucose of 186, calcium of 8.7, protein of 6.3, albumin  of 3.3, bilirubin 1.7, AST 27, alkaline phosphatase 55, ALT 19.  UA:  Yellow, cloudy, specific gravity of 7.0, glucose negative, bilirubin  negative, ketones negative, blood was large, protein greater than 300,  urobilinogen 2.0, nitrite negative, leukocytes moderate.  Micro:  White  blood cells too numerous to count, RBCs 11-20.  Urine culture pending.   Chest x-ray:  No acute cardiopulmonary disease.   EKG:  T wave inversions in 1, 2 and aVL.   CT of the abdomen and pelvis:  Hepatosplenomegaly, no acute findings.   Lower extremity plain films:  No acute bony abnormality, no joint  effusion.   ASSESSMENT/PLAN:  [name] is an 77 year old female with history of  type 2 diabetes diet controlled, hypertension, who presents with  generalized weakness and hypotension and fever.   1. Systemic inflammatory response syndrome/early sepsis.  Will admit      patient to the stepdown unit.  Patient with hypotension, fever and      positive urinary tract infection, likely a urosepsis or a sepsis      versus a cardiogenic shock, patient with EKG changes versus a      hemorrhagic shock which is unlikely, no bleeding episodes,      hemoglobin of 15.  Will panculture.  Will cycle cardiac enzymes q.8      hours x3, check an EKG now and in the morning, check a TSH.  Will      guaiac stools.  Will hydrate with IV fluids and start empiric      antibiotics with vancomycin and Zosyn until cultures are returned.  2. Acute renal failure, likely prerenal versus renal verus post renal.      Will check a urinalysis.  Will check fractional excretion of      sodium.  Will hydrate with IV  fluids and follow up creatinine.  If      no improvement, will check a renal ultrasound and get some follow      up labs.  3. Hypotension.  Hold blood pressure medicines secondary to #1.  4. Type 2 diabetes.  Check a hemoglobin A1c, sliding-scale insulin      a.c. and h.s.,  no bedtime coverage.  5. Hypothyroidism.  Check a TSH.  Continue home dose levothyroxine.  6. Anemia.  Follow hemoglobin.  Continue home dose iron sulfate.  7. Prophylaxis.  Protonix for GI and Lovenox for DVT prophylaxis.   It has been a pleasure of taking care of Ms. Tauri Lapier.      Ramiro Harvest, MD  Electronically Signed     DT/MEDQ  D:  05/03/2007  T:  05/04/2007  Job:  [id number]

## 2010-11-15 ENCOUNTER — Encounter: Payer: Self-pay | Admitting: Internal Medicine

## 2010-11-15 NOTE — Telephone Encounter (Signed)
Pt had labs with Dr Zachery Dauer office this week, Dr Zachery Dauer has discussed w/Dr Bensimhon via phone

## 2010-11-17 NOTE — Op Note (Signed)
NAMEJENSEN, CHERAMIE               ACCOUNT NO.:  [id number]   MEDICAL RECORD NO.:  [id number]          PATIENT TYPE:  AMB   LOCATION:  [facility]                         [facility]ACILITY[facility]   PHYSICIAN:  Courtney Paris, M.D.DATE OF BIRTH:  Jul 16, 1919   DATE OF PROCEDURE:  08/10/2005  DATE OF DISCHARGE:                                 OPERATIVE REPORT   PREOPERATIVE DIAGNOSIS:  Previous grade 3 superficially invasive bladder  tumor.   POSTOP DIAGNOSIS:  Previous grade 3 superficially invasive bladder tumor.   OPERATION:  Transurethral resection of bladder tumor (3 cm).   ANESTHESIA:  General.   SURGEON:  Courtney Paris, M.D.   BRIEF HISTORY:  This 77 year old white female had a high-grade, but  superficially invasive bladder tumor resected June 27, 2005. She had  mitomycin C post resection and enters now for prebiopsy of the entire area  to make sure that the tumor is completely gone. It was in the subepithelium,  but not into muscle previously. She has had previous breast cancer and colon  cancer in the past, but was doing well without any evidence of disease.   The patient was placed on the operating table in the dorsal lithotomy  position; and after satisfactory induction of general anesthesia was prepped  and draped in the in the given Ancef IV. The scope was entered and the  bladder carefully inspected. There were some bright, inflammatory areas were  on the posterior midline basin that were photographed; but the tumor base,  itself, had some necrotic tissue present from the previous mitomycin; and  there were some erythematous areas around the base of the tumor. After these  were photographed using the cold cup biopsy forceps, the lesions in the  posterior midline base were biopsied separately; and then the tumor base was  resected using the cold cup biopsy forceps of an area of about 3 cm.  Hemostasis was achieved by using the Bovie with the Bugbee electrode which  seemed to effect good hemostasis. I went ahead and drained the bladder and  put in an 18 Foley catheter when the irrigant was clear. The patient was  then taken to the recovery room in good condition; and was later discharged  with the catheter for 3 days; and then will come back next week for further  disposition, once the pathology report is known.      Courtney Paris, M.D.  Electronically Signed     HMK/MEDQ  D:  08/10/2005  T:  08/10/2005  Job:  [id number]

## 2010-11-17 NOTE — Op Note (Signed)
NAMEDEVLIN, [name]               ACCOUNT NO.:  [id number]   MEDICAL RECORD NO.:  [id number]          PATIENT TYPE:  AMB   LOCATION:  DAY                          [facility]ACILITY:  [facility]   PHYSICIAN:  Courtney Paris, M.D.DATE OF BIRTH:  1920-05-03   DATE OF PROCEDURE:  06/27/2005  DATE OF DISCHARGE:                                 OPERATIVE REPORT   PREOPERATIVE DIAGNOSIS:  Large bladder tumor anterior dome, hematuria.   POSTOPERATIVE DIAGNOSIS:  Large bladder tumor anterior dome, hematuria.   OPERATION:  TUR large bladder tumor greater than 5 cm plus intravesical  mitomycin C.   ANESTHESIA:  General.   SURGEON:  Courtney Paris, M.D.   BRIEF HISTORY:  This 77 year old lady presented with gross hematuria and was  found to have a sessile posterior anterior tumor in the midline. She had had  microscopic hematuria in April 2006 but with negative CT,  negative cytology  and a nonsmoker. She was not cystoscoped. She had some blood on her tissue  for 6 months and when she had the gross hematuria, a Cysto was done and this  time a large tumor was seen. She enters to have this resected at this time.  This is her fourth primary cancer. She had had previous breast cancer,  hysterectomy for cancer of the cervix and colon cancer found on a malignant  polyp, all treated without recurrence.   DESCRIPTION OF PROCEDURE:  The patient was brought to the operating room and  placed on the table in dorsal lithotomy position and after satisfactory  induction of general endotracheal anesthesia was prepped and draped with  Betadine in the usual sterile fashion. She had her IV antibiotics. The  Olympus continuous flow resectoscope was inserted and the bladder inspected.  The only tumor was at the anterior dome in the midline. It was fairly large  based, had a few blood clots on and was quite sessile. The tumor was  photographed and then with the bladder wall loop, this area was resected  with an  area greater than 5 cm resected. The bladder was not perforated  although it seemed fairly thin when I finished. The edges were fulgurated  and there was excellent hemostasis. The chips were evacuated from the  bladder and a 24 Ainsworth catheter was inserted. She was taken to the  recovery room in good condition. Mitomycin C 40 mg will be instilled into  the bladder, clamped for 30 minutes and then she will probably be discharged  home as an outpatient to have the catheter removed in a week. She tolerated  procedure well, no complications.      Courtney Paris, M.D.  Electronically Signed     HMK/MEDQ  D:  06/27/2005  T:  06/28/2005  Job:  [id number]

## 2010-11-20 NOTE — Discharge Summary (Addendum)
[name]               ACCOUNT NO.:  [id number]  MEDICAL RECORD NO.:  [id number]           PATIENT TYPE:  I  LOCATION:  3707                         FACILITY:  [facility]  PHYSICIAN:  Bevelyn Buckles. Bensimhon, MDDATE OF BIRTH:  12-Dec-1919  DATE OF ADMISSION:  11/03/2010 DATE OF DISCHARGE:  11/11/2010                              DISCHARGE SUMMARY   DISCHARGE DIAGNOSES: 1. Atrial fibrillation with rapid ventricular response.     a.     History of paroxysmal atrial fibrillation in 2008.     b.     Status post cardioversion on Nov 10, 2010.     c.     Initiated on Coumadin with discharge INR of 2.37, for      followup with INR by PCP per patient's preference. 2. Diastolic heart failure.     a.     Discharge weight of 97.795 kg. 3. Diabetes mellitus. 4. Hypertension. 5. Hyperlipidemia. 6. Obesity. 7. Obstructive sleep apnea, on continuous positive airway pressure. 8. Hypothyroidism. 9. History of breast, bladder, cervical, and ovarian cancer. 10.History of small bowel obstruction x2 secondary to adhesions,     resolved without surgery.  HOSPITAL COURSE:  [name] is an 77 year old female with a history of PAF, diabetes, hypertension, hyperlipidemia, multiple cancers who presented to [facility] with complaints of weakness and shortness of breath along with some chest discomfort.  In the ER, she was given 40 mg of IV Lasix and started on IV Cardizem as she was in AFib with RVR.  It was also felt that she had a component of heart failure and was initiated on IV diuresis.  BNP at [facility] was 1391.  She was subsequently transferred to [facility].  She remained on IV heparin and Cardizem.  Cardiac enzymes were cycled which were negative.  She diuresed very well on IV Lasix, and it was subsequently changed to p.o. on Nov 06, 2010.  She was transferred over to p.o. Cardizem, however, her rate remained persistently high and that was increased to 360 mg.   Initially, this provided some heart rate control, however, she rebounded with quicker rate again.  It was felt she would benefit from TEE cardioversion, so this was performed on Nov 10, 2010, which was successfully restored normal sinus rhythm.  Post procedurally, she was in sinus bradycardia with heart rates in the 40s to 70s and therefore, her beta-blocker and calcium channel blockers were stopped.  Her INR slowly drifted up with her new initiation of Coumadin. On day of discharge, her INR was 2.37.  Dr. Gala Romney liked to send her home on 5 mg daily.  He has seen and examined her today and feels she is stable for discharge.  Medication recommendations for discharge are outlined below.  DISCHARGE LABORATORY DATA:  WBC'S 6.4, hemoglobin 16.1, hematocrit 46.1, platelet count 183.  INR 2.37.  Sodium 140, potassium 4.7, chloride 99, CO2 is 31, glucose 111, BUN 23, creatinine 0.87.  Please note her TSH on admission was elevated at 7.951.  She will be instructed to follow up with her PCP as an outpatient to recheck  this.  STUDIES: 1. Chest x-ray, Nov 04, 2010, showed new right subclavian venous     catheter in appropriate position.  No pneumothorax.  Stable CHF and     small right pleural effusion. 2. TEE, Nov 10, 2010, showed difficulty to assess LVEF given AFib,     appears mildly depressed.  Mild AI.  AV mildly calcified and     thickened.  Thick mild atherosclerotic plaquing of the aorta.  Mild     MR.  LA and LA appendage without masses.  Small PFO present by     color Doppler.  Trivial TR.  Small pericardial effusion.  DISCHARGE MEDICATIONS: 1. Norvasc 5 mg daily. 2. Coumadin 5 mg daily. 3. Altace 10 mg daily. 4. Lasix 40 mg two tablets in the morning, one tablet in the evening. 5. Iron OTC 65 mg b.i.d. 6. Synthroid 200 mcg daily.  Please note, her diltiazem was stopped this admission secondary to bradycardia.  DISPOSITION:  [name] will be discharged in stable condition  to home. She is to increase activity slowly and follow a low-sodium heart-healthy diet.  She will follow up with Dr. Gala Romney in approximately 1-2 weeks and our office will call her with this appointment.  Dr. Gala Romney discussed Coumadin in length with her and she elects to follow up at her PCP's office due to proximity to her house.  She was given a lab slip to have an INR drawn on Monday and was instructed to call Dr. Caryn Bee Little's office on Monday to set up a time to come in and get that drawn.  I have asked our office to also call her to make sure that she has followed up on this.  She will also have a BMET drawn on Nov 14, 2010, and was given a lab slip for this too.  Given her TSH elevated at 7.951, she was also instructed to discuss retesting of her thyroid function studies with her primary care provider.  DURATION OF DISCHARGE ENCOUNTER:  Greater than 30 minutes including physician and PA time.     Dayna Dunn, P.A.C.   ______________________________ Bevelyn Buckles. Bensimhon, MD    DD/MEDQ  D:  11/11/2010  T:  11/11/2010  Job:  [id number]  cc:   Caryn Bee L. Little, M.D.  Electronically Signed by Ronie Spies  on 11/20/2010 01:47:02 PM Electronically Signed by Arvilla Meres MD on 11/22/2010 09:34:49 PM

## 2010-11-22 NOTE — H&P (Signed)
[name], [name]               ACCOUNT NO.:  [id number]  MEDICAL RECORD NO.:  [id number]           PATIENT TYPE:  E  LOCATION:  WLED                         FACILITY:  [facility]  PHYSICIAN:  Bevelyn Buckles. Biagio Snelson, MDDATE OF BIRTH:  June 29, 1920  DATE OF ADMISSION:  11/03/2010 DATE OF DISCHARGE:  11/03/2010                             HISTORY & PHYSICAL   PRIMARY CARE PHYSICIAN:  Dr. Clarene Duke.  PRIMARY CARDIOLOGIST:  None.  CHIEF COMPLAINT:  Chest pain and CHF.  HISTORY OF PRESENT ILLNESS:  [name] is an 77 year old female with no previous history of chest pain.  She had an episode of PAF in 2008 when she was hospitalized with urosepsis.  She has a history of an enlarged heart as well, but is not followed by Cardiology for this.  On Nov 01, 2010, she had onset of increased dyspnea on exertion and general malaise.  On Nov 02, 2010, she felt weak and her shortness of breath with exertion worsened.  Today about 8:30, when she woke, she had chest pain, she describes as a "elephant" on her chest that was an 8/10.  She was short of breath at rest.  She noted lower extremity edema.  Her symptoms continued and when her daughter called her to check on her, [name] advised her daughter of her symptoms.  Her daughter brought her to the emergency room.  In the emergency room, she was given Lasix 40 mg IV and started on IV Cardizem.  The patient reports that after the Lasix, her respiratory status is much improved and her chest pain is now down to 4/10.  She does not recall ever having chest pain like this before. Normally, she is not that active because of multiple medical problems, but reports no history of chest pain with exertion, although she has a long history of dyspnea on exertion.  PAST MEDICAL HISTORY: 1. Paroxysmal atrial fibrillation during an admission for urosepsis in     2008. 2. Status post echocardiogram in November 2008 showing an EF of 75%     with no regional wall motion  abnormalities and diastolic     dysfunction was noted. 3. Diabetes. 4. Hypertension. 5. Hyperlipidemia. 6. Obesity. 7. Obstructive sleep apnea, on CPAP. 8. History of small bowel obstruction x2 secondary to adhesions that     resolved without surgery. 9. History of breast, bladder, cervical, and ovarian cancer. 10.Hypothyroidism.  SURGICAL HISTORY:  She is status post left mastectomy as well as lumpectomy, cysto with bladder tumor removal, hysterectomy, and colonoscopy.  ALLERGIES:  She is allergic or intolerant to CODEINE, MORPHINE, and STATIN.  CURRENT MEDICATIONS: 1. Synthroid 200 mcg daily. 2. Lasix 40 mg a day. 3. Iron b.i.d. 4. Diltiazem 120 mg 2 capsules daily. 5. Altace 10 mg b.i.d.  SOCIAL HISTORY:  She lives in [place] with her daughter.  She is a retired Environmental health practitioner.  She quit tobacco more than 40 years ago and denies alcohol or drug abuse.  FAMILY HISTORY:  Mother died at age 39 with heart failure and diabetes. Father died at 74.  Neither her father nor any siblings have any cardiac  issues.  REVIEW OF SYSTEMS:  She has chronic dyspnea on exertion.  She also has chronic daytime edema.  She coughs and wheezes occasionally.  She has significant arthralgias.  She has not had reflux symptoms or melena. She denies dysuria.  Full 14-point review of systems is otherwise negative except as stated in the HPI.  PHYSICAL EXAMINATION:  VITAL SIGNS:  Temperature is 98.2, blood pressure 162/94, pulse 149, respiratory rate 24, O2 saturation 85% on room air. GENERAL:  She is an elderly obese white female who is short of breath. HEENT:  Normal for age. NECK:  There is no lymphadenopathy, thyromegaly, or bruit noted and JVD is approximately 10 cm, but difficult to assess because of body habitus. CV:  Heart is rapid and irregular in rate and rhythm with an S1 and S2 and no significant murmur, rub, or gallop is noted.  Distal pulses are intact in all four  extremities. LUNGS:  She has bibasilar crackles. SKIN:  No rashes or lesions are noted. ABDOMEN:  Soft and nontender with active bowel sounds. EXTREMITIES:  There is no cyanosis, clubbing, and 1+ edema is noted. MUSCULOSKELETAL:  There is no joint deformity or effusions and no spine or CVA tenderness. NEURO:  She is alert and oriented.  Cranial nerves II-XII grossly intact.  Chest x-ray shows cardiomegaly and pulmonary edema.  EKG is AFib, RVR, rate 130 with no acute ischemic changes.  LABORATORY VALUES:  Hemoglobin 15.9, hematocrit 49.0, WBC is 8.8, platelets 191.  Sodium 142, potassium 3.4, chloride 99, BUN 11, creatinine 0.71, glucose 108.  BNP 1391.  Troponin I less than 0.30.  IMPRESSION:  [name] was seen by Dr. Gala Romney, the patient evaluated, and the data reviewed.  She is an 77 year old with new onset heart failure in the setting of atrial fibrillation rapid ventricular response.  She has severe hypertension as well.  A bedside echo showed an ejection fraction of 60%.  We will plan IV diuresis with heart rate and blood pressure control also. She will need heparin and transition to Coumadin.  We will check a formal echocardiogram and cycle cardiac enzymes as well.     Theodore Demark, PA-C   ______________________________ Bevelyn Buckles. Mennie Spiller, MD    RB/MEDQ  D:  11/03/2010  T:  11/04/2010  Job:  [id number]  Electronically Signed by Theodore Demark PA-C on 11/22/2010 11:59:45 AM Electronically Signed by Arvilla Meres MD on 11/22/2010 09:34:58 PM

## 2010-12-31 ENCOUNTER — Inpatient Hospital Stay (HOSPITAL_COMMUNITY)
Admission: EM | Admit: 2010-12-31 | Discharge: 2011-01-04 | DRG: 390 | Disposition: A | Discharge status: Home / Self Care | Payer: Medicare Other | Attending: Internal Medicine | Admitting: Internal Medicine

## 2010-12-31 ENCOUNTER — Emergency Department (HOSPITAL_COMMUNITY): Payer: Medicare Other

## 2010-12-31 DIAGNOSIS — G4733 Obstructive sleep apnea (adult) (pediatric): Secondary | ICD-10-CM | POA: Diagnosis present

## 2010-12-31 DIAGNOSIS — I10 Essential (primary) hypertension: Secondary | ICD-10-CM | POA: Diagnosis present

## 2010-12-31 DIAGNOSIS — E876 Hypokalemia: Secondary | ICD-10-CM | POA: Diagnosis not present

## 2010-12-31 DIAGNOSIS — Z7901 Long term (current) use of anticoagulants: Secondary | ICD-10-CM

## 2010-12-31 DIAGNOSIS — Z8551 Personal history of malignant neoplasm of bladder: Secondary | ICD-10-CM

## 2010-12-31 DIAGNOSIS — E119 Type 2 diabetes mellitus without complications: Secondary | ICD-10-CM | POA: Diagnosis present

## 2010-12-31 DIAGNOSIS — E039 Hypothyroidism, unspecified: Secondary | ICD-10-CM | POA: Diagnosis present

## 2010-12-31 DIAGNOSIS — Z853 Personal history of malignant neoplasm of breast: Secondary | ICD-10-CM

## 2010-12-31 DIAGNOSIS — E669 Obesity, unspecified: Secondary | ICD-10-CM | POA: Diagnosis present

## 2010-12-31 DIAGNOSIS — Z90710 Acquired absence of both cervix and uterus: Secondary | ICD-10-CM

## 2010-12-31 DIAGNOSIS — I4891 Unspecified atrial fibrillation: Secondary | ICD-10-CM | POA: Diagnosis present

## 2010-12-31 DIAGNOSIS — E785 Hyperlipidemia, unspecified: Secondary | ICD-10-CM | POA: Diagnosis present

## 2010-12-31 DIAGNOSIS — Z9010 Acquired absence of unspecified breast and nipple: Secondary | ICD-10-CM

## 2010-12-31 DIAGNOSIS — K565 Intestinal adhesions [bands], unspecified as to partial versus complete obstruction: Principal | ICD-10-CM | POA: Diagnosis present

## 2010-12-31 DIAGNOSIS — Z8543 Personal history of malignant neoplasm of ovary: Secondary | ICD-10-CM

## 2010-12-31 DIAGNOSIS — Z87891 Personal history of nicotine dependence: Secondary | ICD-10-CM

## 2010-12-31 LAB — URINALYSIS, ROUTINE W REFLEX MICROSCOPIC
Nitrite: NEGATIVE
Protein, ur: 30 mg/dL — AB
Urobilinogen, UA: 0.2 mg/dL (ref 0.0–1.0)

## 2010-12-31 LAB — URINE MICROSCOPIC-ADD ON

## 2010-12-31 LAB — COMPREHENSIVE METABOLIC PANEL
Alkaline Phosphatase: 68 U/L (ref 39–117)
BUN: 13 mg/dL (ref 6–23)
Calcium: 9.5 mg/dL (ref 8.4–10.5)
GFR calc Af Amer: >60 mL/min (ref >60)
Glucose, Bld: 131 mg/dL — ABNORMAL HIGH (ref 70–99)
Total Protein: 7.3 g/dL (ref 6.0–8.3)

## 2010-12-31 LAB — DIFFERENTIAL
Basophils Absolute: 0 10*3/uL (ref 0.0–0.1)
Basophils Relative: 0 % (ref 0–1)
Eosinophils Relative: 0 % (ref 0–5)
Monocytes Absolute: 0.3 10*3/uL (ref 0.1–1.0)

## 2010-12-31 LAB — PROTIME-INR
INR: 1.84 — ABNORMAL HIGH (ref 0.00–1.49)
Prothrombin Time: 21.6 seconds — ABNORMAL HIGH (ref 11.6–15.2)

## 2010-12-31 LAB — BASIC METABOLIC PANEL
BUN: 12 mg/dL (ref 6–23)
Calcium: 9 mg/dL (ref 8.4–10.5)
Creatinine, Ser: 0.7 mg/dL (ref 0.50–1.10)
GFR calc non Af Amer: >60 mL/min (ref >60)
Glucose, Bld: 111 mg/dL — ABNORMAL HIGH (ref 70–99)
Potassium: 4.6 mEq/L (ref 3.5–5.1)

## 2010-12-31 LAB — CBC
MCHC: 34.7 g/dL (ref 30.0–36.0)
Platelets: 164 10*3/uL (ref 150–400)
RDW: 15 % (ref 11.5–15.5)

## 2011-01-01 LAB — GLUCOSE, CAPILLARY
Glucose-Capillary: 101 mg/dL — ABNORMAL HIGH (ref 70–99)
Glucose-Capillary: 110 mg/dL — ABNORMAL HIGH (ref 70–99)

## 2011-01-02 ENCOUNTER — Inpatient Hospital Stay (HOSPITAL_COMMUNITY): Payer: Medicare Other

## 2011-01-02 LAB — CBC
MCH: 30.4 pg (ref 26.0–34.0)
MCV: 91.4 fL (ref 78.0–100.0)
Platelets: 142 10*3/uL — ABNORMAL LOW (ref 150–400)
RBC: 4.18 MIL/uL (ref 3.87–5.11)

## 2011-01-02 LAB — HEMOGLOBIN A1C
Hgb A1c MFr Bld: 6.5 % — ABNORMAL HIGH (ref <5.7)
Mean Plasma Glucose: 140 mg/dL — ABNORMAL HIGH (ref <117)

## 2011-01-02 LAB — BASIC METABOLIC PANEL
BUN: 13 mg/dL (ref 6–23)
CO2: 29 mEq/L (ref 19–32)
Chloride: 102 mEq/L (ref 96–112)
Creatinine, Ser: 0.75 mg/dL (ref 0.50–1.10)

## 2011-01-02 LAB — GLUCOSE, CAPILLARY: Glucose-Capillary: 107 mg/dL — ABNORMAL HIGH (ref 70–99)

## 2011-01-03 LAB — GLUCOSE, CAPILLARY

## 2011-01-03 LAB — CBC
Hemoglobin: 12.4 g/dL (ref 12.0–15.0)
MCH: 31 pg (ref 26.0–34.0)
MCV: 90.5 fL (ref 78.0–100.0)
RBC: 4 MIL/uL (ref 3.87–5.11)

## 2011-01-03 LAB — BASIC METABOLIC PANEL
CO2: 27 mEq/L (ref 19–32)
Calcium: 8.2 mg/dL — ABNORMAL LOW (ref 8.4–10.5)
Creatinine, Ser: 0.83 mg/dL (ref 0.50–1.10)
Glucose, Bld: 91 mg/dL (ref 70–99)

## 2011-01-03 LAB — MAGNESIUM: Magnesium: 1.9 mg/dL (ref 1.5–2.5)

## 2011-01-04 LAB — GLUCOSE, CAPILLARY: Glucose-Capillary: 95 mg/dL (ref 70–99)

## 2011-01-05 NOTE — Discharge Summary (Signed)
[name], [name]:  [id number]  MEDICAL RECORD NO.:  [id number]  LOCATION:  2021                         FACILITY:  [facility]  PHYSICIAN:  [name], M.D.DATE OF BIRTH:  Mar 15, 1920  DATE OF ADMISSION:  12/31/2010 DATE OF DISCHARGE:  01/04/2011                              DISCHARGE SUMMARY   PRIMARY CARE DOCTOR:  [name] Bee L. Little, MD  CARDIOLOGIST:  [name] Booty Bensimhon, MD  DISCHARGE DIAGNOSIS: 1. Partial small-bowel obstruction most likely secondary to adhesion. 2. Hypokalemia. 3. Atrial fibrillation. 4. Obstructive sleep apnea.  DISCHARGE MEDICATIONS: 1. Altace 10 mg every morning. 2. Diltiazem CD 120 mg every morning. 3. Excedrin over-the-counter 65 mg b.i.d. 4. Lasix 40 mg 2 tabs in the morning, 1 in the evening. 5. Synthroid 200 mcg daily. 6. Warfarin 5 mg daily as directed.  PROCEDURES PERFORMED: 1. Abdominal x-ray did show tip of enteric tube terminating distally     in the stomach area, mild dilation of few loops of intestine,     considerably less distention in small intestine and previous     studies. 2. Abdominal x-ray on December 31, 2010, showed suspicion for small-bowel     obstruction, air-fluid level.  CONSULTANTS:  None.  BRIEF ADMITTING HISTORY AND PHYSICAL:  This is an 77 year old female with past medical history of multiple abdominal surgeries and recurrent small-bowel obstruction with more recent one being on August 11th, who comes in for nausea, vomiting, and abdominal pain.  She also has been consequently distended, has not had a flatus on the last 24 hours, had had no fever, shortness of breath, chest pain, or palpitations.  She does have a history of AFib diagnosed in May 2012, underwent cardioversion on Nov 10, 2010, on Coumadin.  She also has a history of hypertension and obesity, so we were asked to admit and further evaluate.  PHYSICAL EXAMINATION:  VITAL SIGNS:  Temperature 97, pulse 61,  blood pressure 152/90, he was satting 97% on room air, breathing 18 times per minute. GENERAL:  Awake, alert, and oriented x3, interactive. HEENT:  Pupils equal, round, and reactive to light and accommodation. Extraocular movements intact.  Anicteric.  No pallor.  Neck is supple. No cervical lymphadenopathy.  No supraclavicular adenopathy. CARDIOVASCULAR:  Regular with no murmurs. LUNGS:  Good air movement.  Clear to auscultation. ABDOMEN:  Tympanic, mildly distended and mildly tender to palpation diffusely.  She does have hyperactive bowel sounds. EXTREMITIES:  Positive pulse.  No clubbing and cyanosis.  Bilateral pitting edema. NEUROLOGICAL:  Nonfocal.  Labs on admission showed FOBT positive.  Her white count 7.1, hemoglobin of 14.6, platelet count of 164, ANC of 6.1.  Her INR was 1.8.  Her lactic acid was 1.5.  Her sodium was 142, potassium 3.9, chloride 103, bicarb of 29, glucose 131, BUN of 13, creatinine 0.7.  LFTs within normal limits.  Her UA show few hyaline cast, many bacteria.  No white blood cells, and imaging as above.  ASSESSMENT:  An EKG showed sinus rhythm with no T-wave inversion, normal axis, an extrasystole.  BRIEF HOSPITAL COURSE: 1. Partial small-bowel an NG tube was dropped.  He was put in  intermittent suction and abdominal x-ray was repeated, was much     improved.  The patient passed gas.  She was given ice chips, she     tolerated this well.  NG tube was removed.  She was in bed.  She     was doing okay, but did not have a bowel movement.  She was kept     overnight.  She had 2 bowel movements overnight.  She was feeling     much better.  She relates she feels more energy, she has no pain     and is able to ambulate without any difficulty since she was     discharged in stable condition.  She will continue her MiraLax     p.r.n. 2. Hypokalemia that is probably secondary to nausea and vomiting.     This was repleted.  A mag was followed and was  repleted and it was     within normal limits. 3. AFib with RVR.  She was rate controlled.  Coumadin was stopped.     She was started on heparin in case she needed surgery.  Her INR     trended down.  Her small bowel obstruction resolved.  Her heparin     was stopped.  She was resumed on Coumadin.  She will continue to     take Coumadin and follow up with her Coumadin Clinic for INR check. 4. Obstructive sleep apnea, currently stable.  No changes were made.  DISPOSITION:  The patient will follow up with a Coumadin Clinic next week.  Here, we will check an INR and we will see how she is doing from the INR standpoint.  She will also follow up with her primary care doctor, Dr. Clarene [name] to monitor her blood pressure.     [name], M.D.     AF/MEDQ  D:  01/04/2011  T:  01/05/2011  Job:  [id number]  Electronically Signed by [name] M.D. on 01/05/2011 02:28:35 PM

## 2011-01-06 ENCOUNTER — Inpatient Hospital Stay (HOSPITAL_COMMUNITY)
Admission: EM | Admit: 2011-01-06 | Discharge: 2011-01-10 | DRG: 389 | Disposition: A | Discharge status: Home / Self Care | Payer: Medicare Other | Attending: Internal Medicine | Admitting: Internal Medicine

## 2011-01-06 ENCOUNTER — Encounter (HOSPITAL_COMMUNITY): Payer: Self-pay | Admitting: Radiology

## 2011-01-06 ENCOUNTER — Emergency Department (HOSPITAL_COMMUNITY): Payer: Medicare Other

## 2011-01-06 ENCOUNTER — Observation Stay (HOSPITAL_COMMUNITY): Payer: Medicare Other

## 2011-01-06 DIAGNOSIS — E876 Hypokalemia: Secondary | ICD-10-CM | POA: Diagnosis present

## 2011-01-06 DIAGNOSIS — N390 Urinary tract infection, site not specified: Secondary | ICD-10-CM | POA: Diagnosis present

## 2011-01-06 DIAGNOSIS — K209 Esophagitis, unspecified without bleeding: Secondary | ICD-10-CM | POA: Diagnosis present

## 2011-01-06 DIAGNOSIS — Z853 Personal history of malignant neoplasm of breast: Secondary | ICD-10-CM

## 2011-01-06 DIAGNOSIS — I10 Essential (primary) hypertension: Secondary | ICD-10-CM | POA: Diagnosis present

## 2011-01-06 DIAGNOSIS — Z8551 Personal history of malignant neoplasm of bladder: Secondary | ICD-10-CM

## 2011-01-06 DIAGNOSIS — I359 Nonrheumatic aortic valve disorder, unspecified: Secondary | ICD-10-CM | POA: Diagnosis present

## 2011-01-06 DIAGNOSIS — K922 Gastrointestinal hemorrhage, unspecified: Secondary | ICD-10-CM | POA: Diagnosis present

## 2011-01-06 DIAGNOSIS — K560 Paralytic ileus: Secondary | ICD-10-CM | POA: Diagnosis present

## 2011-01-06 DIAGNOSIS — Z87891 Personal history of nicotine dependence: Secondary | ICD-10-CM

## 2011-01-06 DIAGNOSIS — E785 Hyperlipidemia, unspecified: Secondary | ICD-10-CM | POA: Diagnosis present

## 2011-01-06 DIAGNOSIS — J4489 Other specified chronic obstructive pulmonary disease: Secondary | ICD-10-CM | POA: Diagnosis present

## 2011-01-06 DIAGNOSIS — J449 Chronic obstructive pulmonary disease, unspecified: Secondary | ICD-10-CM | POA: Diagnosis present

## 2011-01-06 DIAGNOSIS — E039 Hypothyroidism, unspecified: Secondary | ICD-10-CM | POA: Diagnosis present

## 2011-01-06 DIAGNOSIS — I5030 Unspecified diastolic (congestive) heart failure: Secondary | ICD-10-CM | POA: Diagnosis present

## 2011-01-06 DIAGNOSIS — J9819 Other pulmonary collapse: Secondary | ICD-10-CM | POA: Diagnosis present

## 2011-01-06 DIAGNOSIS — K56609 Unspecified intestinal obstruction, unspecified as to partial versus complete obstruction: Secondary | ICD-10-CM

## 2011-01-06 DIAGNOSIS — Z66 Do not resuscitate: Secondary | ICD-10-CM | POA: Diagnosis present

## 2011-01-06 DIAGNOSIS — G4733 Obstructive sleep apnea (adult) (pediatric): Secondary | ICD-10-CM | POA: Diagnosis present

## 2011-01-06 DIAGNOSIS — K565 Intestinal adhesions [bands], unspecified as to partial versus complete obstruction: Principal | ICD-10-CM | POA: Diagnosis present

## 2011-01-06 DIAGNOSIS — I509 Heart failure, unspecified: Secondary | ICD-10-CM | POA: Diagnosis present

## 2011-01-06 DIAGNOSIS — I4891 Unspecified atrial fibrillation: Secondary | ICD-10-CM | POA: Diagnosis present

## 2011-01-06 HISTORY — DX: Unspecified intestinal obstruction, unspecified as to partial versus complete obstruction: K56.609

## 2011-01-06 HISTORY — DX: Malignant neoplasm of colon, unspecified: C18.9

## 2011-01-06 HISTORY — DX: Essential (primary) hypertension: I10

## 2011-01-06 LAB — CBC
HCT: 41.7 % (ref 36.0–46.0)
Hemoglobin: 14.6 g/dL (ref 12.0–15.0)
MCH: 31.4 pg (ref 26.0–34.0)
MCHC: 35 g/dL (ref 30.0–36.0)
MCV: 89.7 fL (ref 78.0–100.0)

## 2011-01-06 LAB — PROTIME-INR
INR: 1.33 (ref 0.00–1.49)
Prothrombin Time: 16.7 seconds — ABNORMAL HIGH (ref 11.6–15.2)

## 2011-01-06 LAB — HEPATIC FUNCTION PANEL
Alkaline Phosphatase: 66 U/L (ref 39–117)
Bilirubin, Direct: 0.2 mg/dL (ref 0.0–0.3)
Indirect Bilirubin: 0.6 mg/dL (ref 0.3–0.9)
Total Bilirubin: 0.8 mg/dL (ref 0.3–1.2)
Total Protein: 7 g/dL (ref 6.0–8.3)

## 2011-01-06 LAB — URINALYSIS, ROUTINE W REFLEX MICROSCOPIC
Bilirubin Urine: NEGATIVE
Hgb urine dipstick: NEGATIVE
Ketones, ur: NEGATIVE mg/dL
Nitrite: NEGATIVE
Protein, ur: 30 mg/dL — AB
Urobilinogen, UA: 0.2 mg/dL (ref 0.0–1.0)

## 2011-01-06 LAB — DIFFERENTIAL
Basophils Relative: 0 % (ref 0–1)
Lymphs Abs: 0.9 10*3/uL (ref 0.7–4.0)
Monocytes Absolute: 0.3 10*3/uL (ref 0.1–1.0)
Monocytes Relative: 4 % (ref 3–12)
Neutro Abs: 5.6 10*3/uL (ref 1.7–7.7)

## 2011-01-06 LAB — POCT I-STAT, CHEM 8
BUN: 12 mg/dL (ref 6–23)
Creatinine, Ser: 1 mg/dL (ref 0.50–1.10)
Sodium: 139 mEq/L (ref 135–145)
TCO2: 31 mmol/L (ref 0–100)

## 2011-01-06 LAB — HEPARIN LEVEL (UNFRACTIONATED): Heparin Unfractionated: 0.39 IU/mL (ref 0.30–0.70)

## 2011-01-06 LAB — URINE MICROSCOPIC-ADD ON

## 2011-01-06 LAB — LIPASE, BLOOD: Lipase: 36 U/L (ref 11–59)

## 2011-01-06 MED ORDER — IOHEXOL 300 MG/ML  SOLN
100.0000 mL | Freq: Once | INTRAMUSCULAR | Status: AC | PRN
  Administered 2011-01-06: 100 mL via INTRAVENOUS

## 2011-01-07 ENCOUNTER — Observation Stay (HOSPITAL_COMMUNITY): Payer: Medicare Other

## 2011-01-07 LAB — T3: T3, Total: 60 ng/dl — ABNORMAL LOW (ref 80.0–204.0)

## 2011-01-07 LAB — BASIC METABOLIC PANEL
BUN: 10 mg/dL (ref 6–23)
CO2: 29 mEq/L (ref 19–32)
Calcium: 8.6 mg/dL (ref 8.4–10.5)
GFR calc non Af Amer: >60 mL/min (ref >60)
Glucose, Bld: 88 mg/dL (ref 70–99)
Potassium: 3.4 mEq/L — ABNORMAL LOW (ref 3.5–5.1)

## 2011-01-07 LAB — URINE CULTURE
Colony Count: >=100000
Culture  Setup Time: 201207071115

## 2011-01-07 LAB — URINALYSIS, ROUTINE W REFLEX MICROSCOPIC
Glucose, UA: NEGATIVE mg/dL
Hgb urine dipstick: NEGATIVE
Ketones, ur: 15 mg/dL — AB
Protein, ur: NEGATIVE mg/dL
Urobilinogen, UA: 0.2 mg/dL (ref 0.0–1.0)

## 2011-01-07 LAB — T4, FREE: Free T4: 1.03 ng/dL (ref 0.80–1.80)

## 2011-01-07 LAB — CBC
HCT: 39.1 % (ref 36.0–46.0)
Hemoglobin: 13 g/dL (ref 12.0–15.0)
MCHC: 33.2 g/dL (ref 30.0–36.0)
MCV: 90.7 fL (ref 78.0–100.0)
RDW: 15.5 % (ref 11.5–15.5)

## 2011-01-07 LAB — URINE MICROSCOPIC-ADD ON

## 2011-01-07 NOTE — H&P (Signed)
NAMEHERTHA, [name]:  [id number]  MEDICAL RECORD NO.:  [id number]  LOCATION:  2021                         FACILITY:  [facility]  PHYSICIAN:  Mick Sell, MD DATE OF BIRTH:  10-Jun-1920  DATE OF ADMISSION:  12/31/2010 DATE OF DISCHARGE:                             HISTORY & PHYSICAL   PRIMARY CARE DOCTOR:  Caryn Bee L. Little, MD  CARDIOLOGIST:  Bevelyn Buckles. Bensimhon, MD, with [facility].  CHIEF COMPLAINTS:  Nausea, vomiting, abdominal pain.  HISTORY OF PRESENT ILLNESS:  This is a very pleasant 77 year old female with history of multiple abdominal surgeries and recurrent small bowel obstructions with the most recent being in August 2011 who has had one day now of nausea, vomiting, abdominal pain.  Her abdomen has also become increasingly distended.  She has not had flatus in 24 hours and hence her last bowel movement was yesterday and was normal.  She has had no fevers, shortness of breath, chest pain, or palpitations.  She does have a history of atrial fibrillation, diagnosed in May 2012, underwent cardioversion on Nov 10, 2010, and is on Coumadin.  She also has a history of hypertension and obesity and hypothyroidism.  In the ED, she had an NG tube placed and 700 mL of bilious fluid returned and she actually felt impressively better.  PAST MEDICAL HISTORY: 1. Atrial fibrillation, first time in 2008 with a urosepsis admission     and then again on Nov 10, 2010, she underwent elective     cardioversion by Cardiology and was discharged on diltiazem. 2. Hypertension. 3. Diabetes. 4. Hyperlipidemia. 5. Obesity. 6. Obstructive sleep apnea. 7. History of breast, bladder, cervical, and ovarian cancer. 8. Hypothyroidism.  SOCIAL HISTORY:  She lives with her daughter.  She is retired.  ALLERGIES:  She is intolerant to CODEINE, MORPHINE, and STATINS.  CURRENT MEDICATIONS:  Per her note from her discharge in May include: 1. Warfarin 5 mg, she  takes 1 tablet every day except Tuesday and     Thursday when she takes half of a half tablet. 2. Altace 10 mg once a day. 3. Lasix 40 mg two tablets in the morning, one in the evening. 4. Iron 625 mg twice daily. 5. Synthroid 200 mcg daily. 6. Diltiazem 120 mg CD form, two tablets by mouth every day.  Of note,     she last took her medications at 9:00 a.m. this morning.  REVIEW OF SYSTEMS:  Eleven systems reviewed and are negative except as per HPI.  FAMILY HISTORY:  Noncontributory.  PHYSICAL EXAMINATION:  VITAL SIGNS:  On admission, the patient was afebrile with temperature 97.4, pulse 61, blood pressure 152/90, respirations 18, satting 93-97% on room air. GENERAL:  She is pleasant, alert, interactive. HEENT:  Pupils equal, round, and reactive to light and accommodation. Extraocular movements are intact.  Sclerae anicteric.  No pallor. Oropharynx is clear. NECK:  Supple.  No anterior cervical, posterior cervical, supraclavicular lymphadenopathy. HEART:  Regular with no murmurs. LUNGS:  Clear. ABDOMEN:  Distended, tympanic, mildly tender to palpation diffusely. She does have hyperactive bowel sounds. EXTREMITIES:  She has 2+ bilateral pitting edema. NEUROLOGIC:  She is alert and oriented x3, grossly  nonfocal.  DATA:  Urinalysis normal except for small leukocyte esterase.  Sodium 142, potassium 3.9, chloride 103, bicarb 29, BUN 13, creatinine 0.73, glucose 131, T bili 0.8, alk phos 68, AST 25, ALT 16, total protein 7.3, albumin 4.0, calcium 9.5, lactic acid 1.5.  PT 21.6 with an INR of 1.84. White count 7.1, hemoglobin 14.6, and platelets 164.  Fecal occult blood test is positive.  X-ray of her abdomen two view was suspicious for small bowel obstruction.  IMPRESSION:  This is an 77 year old with recurrent small bowel obstruction, most likely due to adhesions from prior multiple surgeries. She also has a history of atrial fibrillation with recent admission in May as well as  what sounds like a cardioversion at that time.  She has a history of hypertension and diabetes as well.  Currently after having nasogastric placed in the emergency department, she returned 700 mL of bilious fluid and feels great relief with decreased abdominal pain.  PLAN: 1. Small bowel obstruction.  We will keep the NG tube in place to low     intermittent suction and keep her n.p.o.  We will monitor closely     clinical evaluation and NG tube output until resolution of the SBO,     at which time we can clamp the NG tube and if tolerated remove it.     I see no reason for a CT scan at this time, however, if she     decompensates, it will be important to do a CT of her abdomen. 2. History of atrial fibrillation.  This is a relatively recent in     onset.  She has been on Coumadin now since her May admission.  She     is currently in normal sinus rhythm and rate controlled.  She last     took her diltiazem of 240 mg at 9:00 a.m.  I curbsided Cardiology     as she is a patient of [facility] and for now since she cannot     take her oral diltiazem, we will monitor her and if needed, we can     start Lopressor 5 mg IV every 4 hours for rate control.  Another     option would be a diltiazem drip if needed. 3. Hypertension.  We have held her Altace and her diltiazem as she is     n.p.o.  We will change her Lasix to 40 mg IV b.i.d.  We will also     place a clonidine patch at 0.1 mg per hour for blood pressure     control as well.  We will closely monitor her ins and outs as well     given her cardiac history as well as the small bowel obstruction. 4. Anticoagulation.  She is slightly subtherapeutic on her Coumadin,     however, we will hold this given that she is potential surgical     candidate although I hope it does not go that way.  We will not     reverse her Coumadin at this     point unless it is needed. 5. Disposition.  The patient says she has been hospitalized before for      this and has taken about 3 days with a small bowel obstruction to     resolve.  Hopefully, we will have rapid response like this as well.     Mick Sell, MD     DPF/MEDQ  D:  12/31/2010  T:  01/01/2011  Job:  [id number]  Electronically Signed by Clydie Braun MD on 01/07/2011 07:59:49 PM

## 2011-01-08 ENCOUNTER — Observation Stay (HOSPITAL_COMMUNITY): Payer: Medicare Other

## 2011-01-08 DIAGNOSIS — E876 Hypokalemia: Secondary | ICD-10-CM

## 2011-01-08 DIAGNOSIS — I4891 Unspecified atrial fibrillation: Secondary | ICD-10-CM

## 2011-01-08 LAB — BASIC METABOLIC PANEL
BUN: 10 mg/dL (ref 6–23)
Calcium: 8.8 mg/dL (ref 8.4–10.5)
Chloride: 104 mEq/L (ref 96–112)
Creatinine, Ser: 0.66 mg/dL (ref 0.50–1.10)
GFR calc Af Amer: >60 mL/min (ref >60)

## 2011-01-08 LAB — CARDIAC PANEL(CRET KIN+CKTOT+MB+TROPI)
CK, MB: 3 ng/mL (ref 0.3–4.0)
Relative Index: INVALID (ref 0.0–2.5)
Relative Index: INVALID (ref 0.0–2.5)
Relative Index: INVALID (ref 0.0–2.5)
Total CK: 70 U/L (ref 7–177)
Total CK: 78 U/L (ref 7–177)
Total CK: 82 U/L (ref 7–177)
Troponin I: <0.3 ng/mL (ref <0.30)

## 2011-01-08 LAB — HEMOGLOBIN AND HEMATOCRIT, BLOOD: HCT: 42.4 % (ref 36.0–46.0)

## 2011-01-08 LAB — CBC
Platelets: 169 10*3/uL (ref 150–400)
RBC: 4.39 MIL/uL (ref 3.87–5.11)
RDW: 15.2 % (ref 11.5–15.5)
WBC: 5.6 10*3/uL (ref 4.0–10.5)

## 2011-01-08 LAB — MAGNESIUM: Magnesium: 2 mg/dL (ref 1.5–2.5)

## 2011-01-08 LAB — GLUCOSE, CAPILLARY
Glucose-Capillary: 91 mg/dL (ref 70–99)
Glucose-Capillary: 124 mg/dL — ABNORMAL HIGH (ref 70–99)

## 2011-01-09 LAB — BASIC METABOLIC PANEL
CO2: 29 mEq/L (ref 19–32)
Chloride: 104 mEq/L (ref 96–112)
Potassium: 3.7 mEq/L (ref 3.5–5.1)
Sodium: 140 mEq/L (ref 135–145)

## 2011-01-09 LAB — CBC
Platelets: 196 10*3/uL (ref 150–400)
RBC: 4.48 MIL/uL (ref 3.87–5.11)
WBC: 5.2 10*3/uL (ref 4.0–10.5)

## 2011-01-09 LAB — URINE CULTURE: Culture  Setup Time: 201207090034

## 2011-01-09 LAB — HEMOGLOBIN AND HEMATOCRIT, BLOOD
HCT: 41.9 % (ref 36.0–46.0)
Hemoglobin: 13.8 g/dL (ref 12.0–15.0)
Hemoglobin: 14.1 g/dL (ref 12.0–15.0)

## 2011-01-10 LAB — BASIC METABOLIC PANEL
GFR calc non Af Amer: 58 mL/min — ABNORMAL LOW (ref >60)
Glucose, Bld: 102 mg/dL — ABNORMAL HIGH (ref 70–99)
Potassium: 3.4 mEq/L — ABNORMAL LOW (ref 3.5–5.1)
Sodium: 141 mEq/L (ref 135–145)

## 2011-01-10 LAB — CBC
Hemoglobin: 12.9 g/dL (ref 12.0–15.0)
MCHC: 33.3 g/dL (ref 30.0–36.0)
RBC: 4.24 MIL/uL (ref 3.87–5.11)

## 2011-01-10 NOTE — Consult Note (Signed)
NAMEBUNNIE, REHBERG NO.:  [id number]  MEDICAL RECORD NO.:  [id number]  LOCATION:  4713                         FACILITY:  [facility]  PHYSICIAN:  Verne Carrow, MDDATE OF BIRTH:  Jul 27, 1919  DATE OF CONSULTATION:  01/08/2011 DATE OF DISCHARGE:                                CONSULTATION   REASON FOR CONSULTATION:  Atrial fibrillation.  PRIMARY CARDIOLOGIST:  Bevelyn Buckles. Bensimhon, MD  HISTORY OF PRESENT ILLNESS:  Ms. Rayson is a very pleasant 77 year old Caucasian female with a history of paroxysmal atrial fibrillation as well as chronic diastolic heart failure, hypertension, hyperlipidemia, diabetes mellitus, obesity, hypothyroidism, and multiple episodes of recurrent small bowel obstruction after she has had multiple abdominal surgeries.  She was admitted to the hospital on January 06, 2011, with recurrent small bowel obstruction.  The patient was made n.p.o. and her p.o. diltiazem medication was held.  Her small bowel obstruction is resolving, however, earlier this a.m., she converted to atrial fibrillation with rapid ventricular response with a heart rate in the 120s.  She was started on diltiazem drip with good control of her heart rate.  She is currently in the 70s to 38s and plans are being made to convert her to diltiazem by mouth.  The patient tells me that she feels great.  Her abdomen feels much better.  Her nausea is completely resolved.  She denies any chest pain, shortness of breath, or palpitations.  PAST MEDICAL HISTORY: 1. Paroxysmal atrial fibrillation. 2. Recurrent small-bowel obstructions. 3. History of bladder tumor status post resection. 4. Hypertension. 5. Hyperlipidemia. 6. Chronic diastolic heart failure. 7. Diabetes mellitus. 8. Obesity. 9. Obstructive sleep apnea on CPAP. 10.Hypothyroidism. 11.Multiple cancers including breast, ovarian, cervical, and bladder     cancer.  ALLERGIES:  CODEINE, MORPHINE, and  STATINS.  CURRENT MEDICATIONS: 1. Rocephin 1 g IV once daily. 2. Diltiazem 60 mg p.o. every 6 hours. 3. Lasix 40 mg IV once daily. 4. Heparin drip. 5. Synthroid 100 mcg IV once daily. 6. Protonix 80 mg IV once daily. 7. K-Dur 20 mEq p.o. 3 times daily.  SOCIAL HISTORY:  The patient lives here in [place] with her daughter. She has remote history of tobacco abuse but quit smoking 40 years ago. She is retired.  She denies use of alcohol or drugs.  FAMILY HISTORY:  The patient's mother died from congestive heart failure.  Her father died from unknown causes at the age of 37.  REVIEW OF SYSTEMS:  As stated in the history present illness is otherwise negative.  PHYSICAL EXAMINATION:  VITAL SIGNS:  Temperature 98.4, pulse 75, respiratory rate 18, blood pressure 114/81.  She is satting 99% on room air. GENERAL:  She is a pleasant elderly Caucasian female, in no acute distress.  She is alert and oriented x3. PSYCHIATRIC:  Mood and affect are appropriate. MUSCULOSKELETAL:  Moves all extremities equally. NEUROLOGIC:  Nonfocal. SKIN:  Warm and dry. NECK:  No JVD.  No carotid bruits. HEENT:  Normal. LUNGS:  Clear to auscultation bilaterally without wheezes, rhonchi, or crackles noted. CARDIOVASCULAR:  Irregular rhythm with normal rate.  No loud murmurs, gallops, or rubs are noted. ABDOMEN:  Distended with bowel sounds. EXTREMITIES:  No evidence of edema.  DIAGNOSTIC STUDIES: 1. A 12-lead EKG shows atrial fibrillation with a rate of 128 beats     per minute.  There are diffuse nonspecific ST and T-wave changes. 2. Laboratory values showed negative cardiac enzymes x2.  INR 2 days     ago was 1.3.  Hemoglobin 14, potassium was 2.9 this morning.     Magnesium 2.0.  ASSESSMENT AND PLAN:  Atrial fibrillation in a patient with history of paroxysmal atrial fibrillation with enlarged left atrium on echo.  Her p.o. medications were held following admission for her small  bowel obstruction.  She is currently rate controlled with diltiazem drip.  I agree with switching to p.o. diltiazem as tolerated.  I would also agree with replacing her potassium as you are doing.  No other recommendations at this time.  With recent bloody discharge in her NG tube, I would probably hold on resuming her home Coumadin therapy.  She is currently on a heparin drip.  We will make other recommendations as the hospitalization progresses.  We will continue to follow with you.     Verne Carrow, MD     CM/MEDQ  D:  01/08/2011  T:  01/09/2011  Job:  [id number]  cc:   Bevelyn Buckles. Bensimhon, MD  Electronically Signed by Verne Carrow MD on 01/10/2011 08:24:50 AM

## 2011-01-11 NOTE — Consult Note (Signed)
[name], [name]:  [id number]  MEDICAL RECORD NO.:  [id number]  LOCATION:  4713                         FACILITY:  [facility]  PHYSICIAN:  Abigail Miyamoto, M.D. DATE OF BIRTH:  [birth year]  DATE OF CONSULTATION:  01/06/2011 DATE OF DISCHARGE:                                CONSULTATION   REQUESTING PHYSICIAN:  Thad Ranger, MD.  PRIMARY CARE DOCTOR:  Anna Genre. Little, MD.  CARDIOLOGIST:  Bevelyn Buckles. Bensimhon, MD  CHIEF COMPLAINT:  Nausea, vomiting, and abdominal pain.  BRIEF HISTORY:  The patient is an 77 year old female with a history of abdominal pain, nausea, vomiting that lasted less than 24 hours and she was admitted on December 31, 2010 through January 04, 2011.  After discharge on July 5, she was home less than 24 hours before she had a reoccurrence of her abdominal pain, distention, followed by some nausea and vomiting. She was seen back in the ER and readmitted by the hospitalist.  Her film so far shows mildly distended bowel loops with scattered air-fluid levels.  A CT is pending.  Labs shows a white count of 6.8, hemoglobin of 14.6, hematocrit of 41.7, and platelets are 178,000.  Protime is 16.7, INR is 1.33.  UA shows 11- 20 wbc's per high-powered field.  Lipase is 10, total bilirubin is 0.8, alk phos is 66, SGOT is 18, SGPT is 12.  Sodium is 139, potassium is 4.5, chloride is 100, CO2 was not measured, creatinine was 1, BUN was 12, glucose 133.  The patient was admitted by the hospitalist.  We were asked to see in consultation for her recurrent small-bowel obstruction.  PAST MEDICAL HISTORY: 1. She was hospitalized December 31, 2010 through January 04, 2011, with     partial small-bowel obstruction and hypokalemia. Prior admisssions for SBO      March, 2011 and August 2011. 2. History of AF, on Coumadin. 3. Obstructive sleep apnea on CPAP at 7.5 cm of water. 4. BMI of 40.2. 5. History of hypothyroid. 6. Adult-onset diabetes mellitus,  non-insulin-dependent, she controls     it with diet, no meds currently. 7. Dyslipidemia. 8. History of bowel, bladder, cervical and ovarian cancers. 9. Hypertension. 10.History of right cerebral pontine meningioma. 11.History of knee pain and weak legs for sometime. 12.Diabetic neuropathy in the lower extremities, bilateral.  SURGICAL HISTORY: 1. She had a bladder tumor removed 2006 and TUR. 2. TUR in 2007. 3. History of hysterectomy and salpingo-oophorectomy with uterine     cancer. 4. History of lumpectomy in the right and left mastectomy for cancer. 5. She has a history of exploratory laparotomy and a 21-pound benign     tumor removal in the past, reportedly at [facility]. 6. History of colon polyp which was positive, but the patient states     there has been no resection of the bowel.  FAMILY HISTORY:  Mother died with renal disease and coronary artery disease.  Father died at 7, "old age."  Siblings are positive for renal failure, diabetes, and coronary artery disease.  SOCIAL HISTORY:  She has at least a 60-pack-year history, quit in 1968. Alcohol none.  Drugs none.  She currently lives with her  daughter.  REVIEW OF SYSTEMS:  FEVER:  None.  SKIN:  She gets a heat rash under her breast.  No other changes.  CVS:  Negative for headache, syncope, seizures, or stroke.  PSYCH:  No changes.  CARDIAC:  She had pain back in May and none since.  PULMONARY:  She has a dry hacking cough, positive for use of her CPAP at night.  Positive for dyspnea on exertion.  MUSCULOSKELETAL:  She uses a walker for ambulation, has both knee pain and lower extremity weakness.  GI:  Negative for GERD. Positive for nausea and vomiting.  No blood in her stool.  GU:  Positive for frequency.  No trouble voiding.  She probably has a urinary tract infection.  LOWER EXTREMITIES:  Positive for edema, right more than left.  She denies claudication.  She has bilateral lower extremity neuropathy.  She also  notes that she voids whenever she coughs.  HOSPITAL MEDICATIONS:  Duplex suppositories, Rocephin, Cardizem, Lasix, Synthroid.  ADMISSION MEDICATIONS: 1. Altace 10 mg daily. 2. Cardizem CD 120 mg daily. 3. Lasix 80 mg in the a.m. and 40 in the p.m. 4. Synthroid 200 mcg daily. 5. Warfarin, not listed dose. 6. Iron 65 mg daily.  ALLERGIES:  CODEINE, MORPHINE and STATINS.  PHYSICAL EXAMINATION:  GENERAL:  This is a well-nourished, obese white female, no acute distress. VITAL SIGNS:  Temperature is 97.6, heart rate is 48, blood pressure is 168/79, respiratory rate is 17, sats are 90% on room air. HEAD:  Normocephalic. EYES, EARS, NOSE, THROAT AND MOUTH:  All within normal limits. NECK:  Trachea is in midline.  Thyroid is nonpalpable. CHEST:  Clear to auscultation and percussion.  Respiratory effort is normal. CARDIAC:  There is a normal S1 and S2.  There is a 1-2/6 murmur heard in both at the aortic base and at the apex. ABDOMEN:  Positive for bowel sounds.  Belly is very distended, slightly tender.  There is an umbilical hernia and a ventral hernia.  There is a scar in the midline above the umbilicus from prior surgery.  There is ecchymosis in the right lower quadrant.  No masses were palpated. GU/RECTAL:  Deferred. LYMPHADENOPATHY:  None palpated. MUSCULOSKELETAL:  Normal.  She has trace edema in both lower extremities. SKIN:  Mild trace edema in both lower extremities, question of increased erythema. NEUROLOGIC:  No focal deficits.  Cranial nerves II through XII grossly intact. PSYCHIATRY:  Normal affect. LOWER EXTREMITIES:  Trace edema in both lower extremities.  IMPRESSION: 1. Recurrent partial small-bowel obstruction, last bowel movement was yesterday. 2. Hospitalized December 31, 2010 through January 04, 2011 for partial small-     bowel obstruction, also [facility] & AUGUST 2011.  3. History of colon polyps with resection, it is question whether it     was positive for cancer. 4.  History of atrial fibrillation on Coumadin.  Echo on Nov 05, 2010     shows an ejection fraction 55-60%, aortic valve sclerosis, mild     aortic regurgitation and mitral regurgitation.  Right ventricular     systolic pressure is normal.  The patient is on Coumadin. 5. Obstructive sleep apnea on continuous positive airway pressure. 6. Body mass index of 40.2. 7. History of cerebral meningioma. 8. Dyslipidemia. 9. Hypothyroid. 10.History of breast, bladder, ovarian, cervical and probably colon     carcinoma. 11.Chronic obstructive pulmonary disease with an extensive history of     tobacco use.  PLAN: 1. The patient is distended, slightly tender but  not much.  Scheduled     for a CT later this morning. 2. Recurrent partial small-bowel obstruction. 3. Benign tumor resection, abdominal mass, 21 pounds. 4. Status post hysterectomy and salpingo-oophorectomy. 5. History of colon polyps, positive for possible CA. 6. History of atrial fibrillation, EF 55-60%, aortic valve sclerosis,     mild AR and MR, with a normal right ventricular systolic pressure. 7. Obstructive sleep apnea. 8. BMI of 40.2. 9. Adult-onset diabetes mellitus, diet controlled. 10.History of cerebral meningioma. 11.Dyslipidemia. 12.Hypothyroid. 13.History of breast, bladder, ovarian, and cervical cancer. 14.COPD.  The patient is currently comfortable.  She is scheduled for a CT scan and the contrast just arrived.  We will follow up with you with further workup and recommendations as needed.     Eber Hong, P.A.   ______________________________ Abigail Miyamoto, M.D.    WDJ/MEDQ  D:  01/06/2011  T:  01/06/2011  Job:  [id number]  cc:   Thad Ranger, MD Bevelyn Buckles. Bensimhon, MD Anna Genre. Little, M.D.  Electronically Signed by Sherrie George P.A. on 01/08/2011 01:37:35 PM Electronically Signed by Abigail Miyamoto M.D. on 01/11/2011 07:24:39 PM

## 2011-01-17 NOTE — H&P (Signed)
NAMEALABAMA, [name]:  [id number]  MEDICAL RECORD NO.:  [id number]  LOCATION:  4713                         FACILITY:  [facility]  PHYSICIAN:  [name] Ranger, MD       DATE OF BIRTH:  1920-01-13  DATE OF ADMISSION:  01/06/2011 DATE OF DISCHARGE:                             HISTORY & PHYSICAL   PRIMARY CARE PHYSICIAN:  [name] Bee L. Little, MD  CARDIOLOGIST:  [name] Buckles. Bensimhon, MD  CHIEF COMPLAINT:  Nausea, vomiting, abdominal pain since yesterday.  HISTORY OF PRESENT ILLNESS:  [name] is a 77 year old female with history of recurrent small bowel obstruction who was recently discharged on January 04, 2011 when she had presented with similar complaints and was found to have partial small bowel obstruction.  The patient states that she was in her baseline state of health for 1 day and then she restarted her symptoms of abdominal pain yesterday evening.  She states that initially it felt like gas pains and then she started having periumbilical pain about 9/10 in intensity.  Her abdomen also became increasingly distended.  She had a bowel movement yesterday.  She also started having intractable nausea and vomiting till 2 a.m. and then once at 4:00 a.m. this morning.  Otherwise she denied any fevers, chills, or any hematochezia, melena, any hematemesis.  The patient's daughter states that since March 2011, this is her fifth small bowel obstruction. The patient has history of multiple abdominal surgeries and recurrent small bowel obstruction.  The patient also has history of atrial fibrillation which was diagnosed in May 2012 when she underwent cardioversion and has been on Cardizem and Coumadin for anticoagulation.  Hospitalist Service was requested for admission for small bowel obstruction.  Workup in the ED also reveals ileus or early small bowel obstruction on the abdominal x-ray.  REVIEW OF SYSTEMS:  Pertinent positives are dictated above.  PAST MEDICAL  HISTORY: 1. History of recurrent small bowel obstruction.  Most recent     admission on December 31, 2010 which was managed medically. 2. History of atrial fibrillation first time in 2008 and then again in     May 2012 the patient underwent elective cardioversion by     Cardiology.  She is currently on Coumadin for anticoagulation and     follows [name]. 3. Hypertension. 4. Hyperlipidemia. 5. Obstructive sleep apnea. 6. History of breast cancer. 7. History of bladder cancer, cervical cancer, and ovarian cancer.     The patient also states that she has questionable colon cancer but     was treated with local resection. 8. Hypothyroidism.  SOCIAL HISTORY:  The patient lives with her daughter.  Denies any alcohol, smoking, or any drug use.  She currently ambulates with a walker.  ALLERGIES:  The patient is allergic to CODEINE, MORPHINE, and STATINS.  MEDICATIONS PRIOR TO ADMISSION: 1. Altace 10 mg q.a.m. 2. Cardizem CD 120 mg q.a.m. 3. Lasix 80 mg in a.m. and 40 mg in the evening. 4. Synthroid 200 mcg daily. 5. Warfarin per her schedule. 6. Iron 65 mg 1 tablet p.o. daily.  PHYSICAL EXAMINATION:  VITAL SIGNS:  Blood pressure 148/66, pulse rate 50, respirations 18, temperature  97.3. GENERAL:  The patient is alert, awake and oriented x3 not in acute distress. HEENT:  Anicteric sclerae and conjunctivae.  Pupils reactive to light and accommodation.  EOMI. NECK:  Supple.  No lymphopathy, no JVD. CVS:  S1, S2 clear. CHEST:  Fairly clear to auscultation bilaterally. ABDOMEN:  Soft, minimal tenderness in the periumbilical region. Hypoactive bowel sounds. EXTREMITIES:  Trace edema, bilateral lower extremities.  No cyanosis or clubbing. NEURO:  No focal neurological deficits noted.  DIAGNOSTIC DATA:  Sodium 139, potassium 4.5, BUN 12, creatinine 1.0. CBC showed white count of 6.8, hemoglobin 14.6, hematocrit 41.7, platelets 178.000.  LFTs unremarkable.  Lipase 36.  UA,  negative nitrites, small leukocytes, WBCs 11-20, few bacteria.  INR 1.33.  RADIOLOGICAL DATA:  Acute abdominal series July 7, cardiac enlargement. No evidence of active pulmonary disease, nonspecific bowel gas pattern with few mildly distended small bowel loops and scattered air-fluid levels, changes night represent ileus or early obstruction.  IMPRESSION:  [name] is a 77 year old female with multiple medical problems with a history of atrial fibrillation, hypothyroidism, history of multiple cancers including breast cancer, bladder cancer, cervical cancer, ovarian cancer with multiple abdominal surgeries presents with nausea, vomiting, abdominal pain which appears to be another episode of partial small bowel obstruction/ileus. 1. Partial small bowel obstruction/ileus.  The patient will be     admitted to Medicine Service.  Previous small bowel obstruction was     medically managed and the patient was discharged on January 04, 2011.     We will place NG tube to intermittent wall suction and keep her     n.p.o. until the resolution of the small bowel obstruction.  I do     want to get a CT scan of the abdomen and pelvis this time as this    appears to be a recurrent small bowel obstruction.  The patient     does have history of multiple small bowel obstructions in the past     and multiple abdominal surgeries.  Possibly she could have this     recurrent issues secondary to the adhesions, however, I want to     rule out any obstructive pathology or any tumor.  The patient also     gives a vague history of colon cancer in the past but states that     she only had polypectomy and local resection.  CCS is also     consulted and I personally discussed with [name] who will     evaluate the patient as well. 2. History of atrial fibrillation recently diagnosed in May.     Currently rate is controlled and we will continue diltiazem for     now.  However, if the patient is unable to take any  p.o. medication     and the rate is uncontrolled, I will start her on IV Lopressor.  I     will hold the Coumadin for now.  INR is subtherapeutic.  Given she     is potential surgical candidate if the small bowel obstruction has     not resolved, I will place her on heparin drip for now. 3. Hypertension currently stable.  For now I will hold the Altace,     continue diltiazem.  I also changed the Lasix to     IV.  If her blood pressure is not well controlled, we can add     Lopressor or clonidine patch. 4. Hypothyroidism.  Change Synthroid to IV and obtain  TSH.  CODE STATUS:  I discussed in detail with the patient and she opted to be DNR.     [name] Ranger, MD     RR/MEDQ  D:  01/06/2011  T:  01/06/2011  Job:  [id number]  cc:   [name] Buckles. Bensimhon, MD Anna Genre. [name], M.D. Abigail Miyamoto, M.D.  Electronically Signed by Andres Labrum Elyanah Farino  on 01/17/2011 05:48:43 PM

## 2011-01-17 NOTE — Discharge Summary (Signed)
NAMESAGA, BALTHAZAR NO.:  [id number]  MEDICAL RECORD NO.:  [id number]  LOCATION:  4713                         FACILITY:  [facility]  PHYSICIAN:  Thad Ranger, MD       DATE OF BIRTH:  [birth year]  DATE OF ADMISSION:  01/06/2011 DATE OF DISCHARGE:                        DISCHARGE SUMMARY - REFERRING   PRIMARY CARE PHYSICIAN:  Dr. Catha Gosselin.  CARDIOLOGIST:  Dr. Arvilla Meres.  DISCHARGE DIAGNOSES: 1. Partial small bowel obstruction, most likely secondary to adhesions     from previous multiple surgeries. 2. Hypokalemia, replaced. 3. Atrial fibrillation with rapid ventricular rate, improved.     Restarted Coumadin. 4. Obstructive sleep apnea. 5. Mild upper gastrointestinal bleed possibly secondary to     esophagitis, improved. 6. Hypothyroidism.  CONSULTATION:  [facility], Dr. Magnus Ivan.  Cardiology, Dr. Gala Romney.  DISCHARGE MEDICATIONS: 1. Dulcolax suppository 10 mg rectally daily. 2. Cardizem CD 240 mg p.o. daily. 3. Protonix 40 mg p.o. daily. 4. Altace 10 mg p.o. q.a.m. 5. Iron 65 mg p.o. b.i.d. 6. Lasix 40 mg p.o. b.i.d. 7. Synthroid 200 mcg 1 tablet p.o. q.a.m. 8. Warfarin 5 mg per the home schedule.  RADIOLOGICAL DATA:  Acute abdominal series on July 07 showed cardiac enlargement.  No evidence of active pulmonary disease, nonspecific bowel gas pattern with mildly distended small bowel loops and scattered air- fluid levels changes might represent ileus or early obstruction.  CT abdomen and pelvis on July 07 showed new small right pleural effusion which is of uncertain etiology and clinical significance.  No definitive evidence of recurrent or metastatic disease within the abdomen and pelvis.  Tiny amount of free fluid in the pelvis which remained stable, stable small benign left renal angiomyolipoma.  Abdominal x-ray on July 08 showed decreased dilatation of small bowel following nasogastric tube placement.  Abdominal x-rays  on July 09 showed mild gaseous distention of colon without definitive evidence of obstruction.  Chest x-ray on July 09 shows interval advancement of the right upper extremity PICC line with tip overlying the superior aspect of right atrium, improved aeration of the right lower lung with persistent bilateral perihilar heterogenous opacities, possibly atelectasis though underlying infection is not excluded.  PERTINENT LAB DIAGNOSTIC DATA:  UA and culture showed insignificant growth.  BMET at the time of discharge, sodium 149, potassium 3.4, BUN 10, creatinine 0.91.  CBC, hemoglobin 12.9, hematocrit 38.7, white count 4.6, platelets 178.  Initial urine culture on July 07 had shown more than 100,000 colonies, multiple bacterial morphotypes.  TSH 0.39.  Free T4 1.03. Total T3 60.  BRIEF HOSPITALIZATION COURSE:  The patient is an 77 year old female who was admitted with recurrent partial small bowel obstruction. 1. Partial small bowel obstruction.  The patient was admitted to the     hospitalist service.  The patient has a history of previous small     bowel obstructions and recurrent and she was recently discharged on     January 04, 2011.  Given the abdominal distention and abdominal x-ray     findings, NG tube to intermittent wall suction was placed.  The     patient was placed on n.p.o. status.  CT scan of the  abdomen and     pelvis was obtained which did not show any tumors or mechanical     obstruction.  [facility] was consulted and the     patient was managed medically.  At the time of the discharge, she     is tolerating regular diet well. 2. History of atrial fibrillation with rapid ventricular rate.  On     January 08, 2011, the patient overnight converted to atrial     fibrillation with rapid ventricular rate.  She also was placed on     heparin drip at the time of admission and Coumadin was held.  The     patient was placed on the Cardizem drip and subsequently      transitioned to p.o. Cardizem.  Cardiology was consulted and     recommended continuing p.o. Cardizem which has been increased to 40     mg daily.  The patient can restart Coumadin today.  Heparin drip     was discontinued on July 09 given the bleeding in the NG aspirate.     The patient was also started on Protonix. 3. Urinary tract infection.  The patient was started on Rocephin and     has received 4 days of IV Rocephin.  Urine culture had shown more     than 100,000 colonies with multiple bacterial morphotypes.  Repeat     urine culture showed insignificant growth.  The patient does not     need any further antibiotic course.  PHYSICAL EXAMINATION:  VITAL SIGNS:  At the time of the discharge, temp 97.4, pulse 65, respirations 18, blood pressure 128/78, and O2 sats 98% on 2 L. GENERAL:  The patient is alert, awake and oriented x3, not in acute distress. HEENT:  Anicteric sclerae, pink conjunctiva.  Pupils are reactive to light and accommodation.  EOMI. NECK:  Supple.  No lymphopathy.  No JVD. CVS:  S1 and S2 clear. CHEST:  Clear to auscultation bilaterally. ABDOMEN:  Soft, nontender, nondistended.  Normal bowel sounds. EXTREMITIES:  No cyanosis, clubbing or edema noted in upper or lower extremities bilaterally.  DISCHARGE INSTRUCTIONS:  Discharge follow-up with Dr. Gala Romney in next 2 weeks and Dr. Catha Gosselin in 7-10 days, post hospitalization follow- up.  DISCHARGE TIME:  35 minutes.     Thad Ranger, MD     RR/MEDQ  D:  01/10/2011  T:  01/10/2011  Job:  [id number]  cc:   Caryn Bee L. Little, M.D. Bevelyn Buckles. Bensimhon, MD Abigail Miyamoto, M.D.  Electronically Signed by Andres Labrum Yeraldin Litzenberger  on 01/17/2011 05:48:30 PM

## 2011-01-22 ENCOUNTER — Encounter: Payer: Self-pay | Admitting: Podiatry

## 2011-02-01 ENCOUNTER — Telehealth: Payer: Self-pay | Admitting: Internal Medicine

## 2011-02-01 NOTE — Telephone Encounter (Signed)
Per pt call, pt was recently D/C from hospital and was suppose to schedule an appt w/ Dr. Gala Romney for a 2 wk f/u. Pt said she was told the soonest appt w/ Bensimhon is in December and pt wanted to know if she can see him sooner. Please return pt call to advise/discuss.

## 2011-02-05 NOTE — Telephone Encounter (Signed)
Marlowe Kays can you sch pt to see Lorin Picket for eph and then probably should see another cardiologist since Jesusita Oka will be over here in clinic and that frequently in the office there, thanks

## 2011-02-06 ENCOUNTER — Encounter: Payer: Medicare Other | Admitting: Physician Assistant

## 2011-02-08 ENCOUNTER — Telehealth: Payer: Self-pay | Admitting: Internal Medicine

## 2011-02-08 NOTE — Telephone Encounter (Signed)
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

## 2011-02-09 NOTE — Telephone Encounter (Signed)
[name] sch her an appt w/Dr Bensimhon

## 2011-02-14 ENCOUNTER — Ambulatory Visit (INDEPENDENT_AMBULATORY_CARE_PROVIDER_SITE_OTHER): Payer: Medicare Other | Admitting: Internal Medicine

## 2011-02-14 ENCOUNTER — Encounter: Payer: Self-pay | Admitting: Internal Medicine

## 2011-02-14 VITALS — BP 126/78 | HR 132 | Ht 61.24 in | Wt 213.0 lb

## 2011-02-14 DIAGNOSIS — I5032 Chronic diastolic (congestive) heart failure: Secondary | ICD-10-CM

## 2011-02-14 DIAGNOSIS — I4891 Unspecified atrial fibrillation: Secondary | ICD-10-CM

## 2011-02-14 DIAGNOSIS — I509 Heart failure, unspecified: Secondary | ICD-10-CM

## 2011-02-14 NOTE — Patient Instructions (Signed)
Your physician has recommended that you wear an event monitor. Event monitors are medical devices that record the heart's electrical activity. Doctors most often [place] these monitors to diagnose arrhythmias. Arrhythmias are problems with the speed or rhythm of the heartbeat. The monitor is a small, portable device. You can wear one while you do your normal daily activities. This is usually used to diagnose what is causing palpitations/syncope (passing out).   2 WEEK EVENT MONITOR  Your physician recommends that you schedule a follow-up appointment in: with Dr Johney Frame

## 2011-02-14 NOTE — Progress Notes (Signed)
HPI: [name] is a very pleasant 77 year old Caucasian female with a history of paroxysmal atrial fibrillation that has been rate controlled in the past as well as chronic diastolic heart failure with LVEF 55%, hypertension, hyperlipidemia, diabetes mellitus, obesity, hypothyroidism, and multiple episodes of recurrent small bowel obstruction after she has had multiple abdominal  surgeries.   Most recently she was admitted to the hospital on January 06, 2011, with recurrent small bowel obstruction which was managed medically.  Her hospital course was complicated by PAF with RVR up to 128.  She was placed on IV diltiazem while NPO, this controlled her rates well and she was transition back to cardizem but at an increased dose of 240 mg.  She has also been continued on her coumadin.    She returns for follow up today with her daughter.  She says she feels great. No palpitations. Occasional edema. No CP. Can walk around store and only stops a few times. Compliant with meds. No bleeding with coumadin. Takes her BP with a cuff and HR has been mostly in 70-80s.    ROS: All systems negative except as listed in HPI, PMH and Problem List.  Past Medical History  Diagnosis Date  . Diabetes mellitus   . Hypertension   . SBO (small bowel obstruction)   . Colon cancer     Current Outpatient Prescriptions  Medication Sig Dispense Refill  . bisacodyl (FLEET) 10 MG/30ML ENEM Place 10 mg rectally as needed.        . diltiazem (DILACOR XR) 240 MG 24 hr capsule Take 240 mg by mouth daily.        . furosemide (LASIX) 40 MG tablet Take 40 mg by mouth 2 (two) times daily.        Marland Kitchen levothyroxine (SYNTHROID, LEVOTHROID) 200 MCG tablet Take 200 mcg by mouth every morning.        . pantoprazole (PROTONIX) 40 MG tablet Take 40 mg by mouth daily.        . ramipril (ALTACE) 10 MG capsule Take 10 mg by mouth every morning.        . warfarin (COUMADIN) 5 MG tablet Take 5 mg by mouth daily. Take  1/2 to 1 tablet           PHYSICAL EXAM: Filed Vitals:   02/14/11 1309  BP: 126/78  Pulse: 132   General:  Well appearing. No resp difficulty HEENT: normal Neck: supple. JVP flat. Carotids 2+ bilaterally; no bruits. No lymphadenopathy or thryomegaly appreciated. Cor: PMI normal. Irregular. tachy No rubs, gallops or murmurs. Lungs: clear Abdomen: soft, nontender, nondistended. No hepatosplenomegaly. No bruits or masses. Good bowel sounds. Extremities: no cyanosis, clubbing, rash. Tr edema Neuro: alert & orientedx3, cranial nerves grossly intact. Moves all 4 extremities w/o difficulty. Affect pleasant.   ECG: Afib at 132 bpm.     ASSESSMENT & PLAN:

## 2011-02-16 ENCOUNTER — Encounter (INDEPENDENT_AMBULATORY_CARE_PROVIDER_SITE_OTHER): Payer: Medicare Other

## 2011-02-16 DIAGNOSIS — I4891 Unspecified atrial fibrillation: Secondary | ICD-10-CM

## 2011-02-17 DIAGNOSIS — I5043 Acute on chronic combined systolic (congestive) and diastolic (congestive) heart failure: Secondary | ICD-10-CM | POA: Insufficient documentation

## 2011-02-17 DIAGNOSIS — I4821 Permanent atrial fibrillation: Secondary | ICD-10-CM | POA: Insufficient documentation

## 2011-02-17 NOTE — Assessment & Plan Note (Signed)
Volume status well controlled.  Continue current regimen.   

## 2011-02-17 NOTE — Assessment & Plan Note (Addendum)
She is back in rapid AF today but is asymptomatic. Unclear if this is persistent or paroxysmal. HR on cuff at home appears controlled but may be undersensed. Will place 2-week event monitor to assess burden of AF. If AF burden high will likely need repeat DC-CV with anti-arrhythmic support. Will follow closely. Extensive discussion with patient and her two daughters about mechanisms of atrial fib and its treatment options.  Patient seen and examined with Ulyess Blossom PA-C. We discussed all aspects of the encounter. I agree with the assessment and plan as stated above.

## 2011-02-23 ENCOUNTER — Telehealth: Payer: Self-pay | Admitting: *Deleted

## 2011-02-23 NOTE — Telephone Encounter (Signed)
Pt is currently wearing a [employer] monitor which is showing chronic a-fib with multiple episodes of RVR per Dr Gala Romney increase Dilt to 360 mg daily, have called and Left message to call back

## 2011-02-26 NOTE — Telephone Encounter (Signed)
LMTCB

## 2011-02-26 NOTE — Telephone Encounter (Signed)
I talked with pt. She will increase Diltiazem CD to 360mg  daily.

## 2011-02-26 NOTE — Telephone Encounter (Signed)
Returning call back to nurse.  

## 2011-03-07 ENCOUNTER — Inpatient Hospital Stay (HOSPITAL_COMMUNITY)
Admission: EM | Admit: 2011-03-07 | Discharge: 2011-03-09 | DRG: 389 | Disposition: A | Discharge status: Home Health | Payer: Medicare Other | Attending: Internal Medicine | Admitting: Internal Medicine

## 2011-03-07 ENCOUNTER — Emergency Department (HOSPITAL_COMMUNITY): Payer: Medicare Other

## 2011-03-07 DIAGNOSIS — E860 Dehydration: Secondary | ICD-10-CM | POA: Diagnosis present

## 2011-03-07 DIAGNOSIS — K5900 Constipation, unspecified: Secondary | ICD-10-CM | POA: Diagnosis present

## 2011-03-07 DIAGNOSIS — I509 Heart failure, unspecified: Secondary | ICD-10-CM | POA: Diagnosis present

## 2011-03-07 DIAGNOSIS — I5032 Chronic diastolic (congestive) heart failure: Secondary | ICD-10-CM | POA: Diagnosis present

## 2011-03-07 DIAGNOSIS — E039 Hypothyroidism, unspecified: Secondary | ICD-10-CM | POA: Diagnosis present

## 2011-03-07 DIAGNOSIS — Z8551 Personal history of malignant neoplasm of bladder: Secondary | ICD-10-CM

## 2011-03-07 DIAGNOSIS — Z79899 Other long term (current) drug therapy: Secondary | ICD-10-CM

## 2011-03-07 DIAGNOSIS — E119 Type 2 diabetes mellitus without complications: Secondary | ICD-10-CM | POA: Diagnosis present

## 2011-03-07 DIAGNOSIS — I10 Essential (primary) hypertension: Secondary | ICD-10-CM | POA: Diagnosis present

## 2011-03-07 DIAGNOSIS — Z7901 Long term (current) use of anticoagulants: Secondary | ICD-10-CM

## 2011-03-07 DIAGNOSIS — I4891 Unspecified atrial fibrillation: Secondary | ICD-10-CM | POA: Diagnosis present

## 2011-03-07 DIAGNOSIS — K560 Paralytic ileus: Principal | ICD-10-CM | POA: Diagnosis present

## 2011-03-07 DIAGNOSIS — G4733 Obstructive sleep apnea (adult) (pediatric): Secondary | ICD-10-CM | POA: Diagnosis present

## 2011-03-07 LAB — DIFFERENTIAL
Basophils Absolute: 0 10*3/uL (ref 0.0–0.1)
Lymphocytes Relative: 10 % — ABNORMAL LOW (ref 12–46)
Monocytes Absolute: 0.4 10*3/uL (ref 0.1–1.0)
Neutro Abs: 6.9 10*3/uL (ref 1.7–7.7)

## 2011-03-07 LAB — URINE MICROSCOPIC-ADD ON

## 2011-03-07 LAB — COMPREHENSIVE METABOLIC PANEL
ALT: 17 U/L (ref 0–35)
AST: 21 U/L (ref 0–37)
Alkaline Phosphatase: 70 U/L (ref 39–117)
CO2: 27 mEq/L (ref 19–32)
Chloride: 104 mEq/L (ref 96–112)
GFR calc non Af Amer: >60 mL/min (ref >60)
Potassium: 3.5 mEq/L (ref 3.5–5.1)
Sodium: 142 mEq/L (ref 135–145)
Total Bilirubin: 0.9 mg/dL (ref 0.3–1.2)

## 2011-03-07 LAB — PROTIME-INR: INR: 1.94 — ABNORMAL HIGH (ref 0.00–1.49)

## 2011-03-07 LAB — MAGNESIUM: Magnesium: 2.1 mg/dL (ref 1.5–2.5)

## 2011-03-07 LAB — URINALYSIS, ROUTINE W REFLEX MICROSCOPIC
Glucose, UA: NEGATIVE mg/dL
Ketones, ur: 15 mg/dL — AB
Protein, ur: 100 mg/dL — AB

## 2011-03-07 LAB — CBC
HCT: 46.3 % — ABNORMAL HIGH (ref 36.0–46.0)
Hemoglobin: 15.9 g/dL — ABNORMAL HIGH (ref 12.0–15.0)
MCHC: 34.3 g/dL (ref 30.0–36.0)
RBC: 5.06 MIL/uL (ref 3.87–5.11)
WBC: 8.1 10*3/uL (ref 4.0–10.5)

## 2011-03-07 LAB — TSH: TSH: 4.272 u[IU]/mL (ref 0.350–4.500)

## 2011-03-07 LAB — GLUCOSE, CAPILLARY: Glucose-Capillary: 110 mg/dL — ABNORMAL HIGH (ref 70–99)

## 2011-03-08 ENCOUNTER — Inpatient Hospital Stay (HOSPITAL_COMMUNITY): Payer: Medicare Other

## 2011-03-08 LAB — CBC
HCT: 41.7 % (ref 36.0–46.0)
MCH: 31.2 pg (ref 26.0–34.0)
MCV: 93.5 fL (ref 78.0–100.0)
Platelets: 169 10*3/uL (ref 150–400)
RDW: 15.7 % — ABNORMAL HIGH (ref 11.5–15.5)
WBC: 6.5 10*3/uL (ref 4.0–10.5)

## 2011-03-08 LAB — GLUCOSE, CAPILLARY: Glucose-Capillary: 127 mg/dL — ABNORMAL HIGH (ref 70–99)

## 2011-03-08 LAB — BASIC METABOLIC PANEL
BUN: 11 mg/dL (ref 6–23)
CO2: 26 mEq/L (ref 19–32)
Calcium: 9.4 mg/dL (ref 8.4–10.5)
GFR calc non Af Amer: >60 mL/min (ref >60)
Glucose, Bld: 119 mg/dL — ABNORMAL HIGH (ref 70–99)
Sodium: 141 mEq/L (ref 135–145)

## 2011-03-08 LAB — URINE CULTURE
Colony Count: 3000
Culture  Setup Time: 201209051052

## 2011-03-09 LAB — PROTIME-INR
INR: 2.1 — ABNORMAL HIGH (ref 0.00–1.49)
Prothrombin Time: 23.9 seconds — ABNORMAL HIGH (ref 11.6–15.2)

## 2011-03-09 LAB — GLUCOSE, CAPILLARY: Glucose-Capillary: 77 mg/dL (ref 70–99)

## 2011-03-14 NOTE — Progress Notes (Signed)
Addended by: Judithe Modest D on: 03/14/2011 03:38 PM   Modules accepted: Orders

## 2011-03-19 ENCOUNTER — Encounter: Payer: Self-pay | Admitting: Internal Medicine

## 2011-03-19 ENCOUNTER — Ambulatory Visit (INDEPENDENT_AMBULATORY_CARE_PROVIDER_SITE_OTHER): Payer: Medicare Other | Admitting: Internal Medicine

## 2011-03-19 VITALS — BP 110/72 | HR 70 | Ht 61.5 in | Wt 209.4 lb

## 2011-03-19 DIAGNOSIS — I509 Heart failure, unspecified: Secondary | ICD-10-CM

## 2011-03-19 DIAGNOSIS — I4891 Unspecified atrial fibrillation: Secondary | ICD-10-CM

## 2011-03-19 DIAGNOSIS — I5032 Chronic diastolic (congestive) heart failure: Secondary | ICD-10-CM

## 2011-03-19 NOTE — Patient Instructions (Addendum)
Your physician recommends that you schedule a follow-up appointment in: 6 weeks  Your physician has recommended you make the following change in your medication: Take 240mg  of Cardiazem twice daily

## 2011-03-19 NOTE — Assessment & Plan Note (Signed)
The patient has persistent atrial fibrillation.  She is minimally symptomatic but does have occasional fatigue, likely due to RVR. I have reviewed her event monitor which reveals occasional RVR.  Today, her heart rate is controlled. We had a long discussion today about rate vs rhythm control.  I have recommended cardioversion after initiation of an antiarrhythmic medicine.  She reports that she did not tolerate amiodarone in the past.  She declines hospitalization for tikosyn or sotalol.  Given her chronic N/V, I think that multaq would be a poor option.  She also declines flecainide.  After long consideration, she has therefore decided to continue rate control at this time. I will increase cardizem to 240mg  BID today. She will return in 6 weeks for further assessment.  Continue coumadin long term.

## 2011-03-19 NOTE — Assessment & Plan Note (Signed)
Stable No change required today Salt restiction advised

## 2011-03-19 NOTE — Progress Notes (Signed)
The patient presents today for routine cardiology followup.  She has longstanding persistent atrial fibrillation.  She has had multiple recent hospital admissions for small bowel obstruction.  In this setting, she frequently has afib with RVR. She was recently seen by Dr Gala Romney.  At that time, an event monitor was placed.  This demonstrates persistent atrial fibrillation.  She has frequent episodes of RVR.  She remains minimally symptomatic.  She reports occasional fatigue but denies symptoms of palpitations, chest pain, shortness of breath, orthopnea, PND, dizziness, presyncope, syncope, or neurologic sequela.  She has stable edema.  The patient feels that she is tolerating medications without difficulties and is otherwise without complaint today.   Past Medical History  Diagnosis Date  . Diabetes mellitus   . Hypertension   . SBO (small bowel obstruction)   . Colon cancer   . Persistent atrial fibrillation    No past surgical history on file.  Current Outpatient Prescriptions  Medication Sig Dispense Refill  . bisacodyl (FLEET) 10 MG/30ML ENEM Place 10 mg rectally as needed.        . diltiazem (CARDIZEM CD) 360 MG 24 hr capsule Take 1 capsule (360 mg total) by mouth daily.      . ferrous sulfate 325 (65 FE) MG tablet Take 325 mg by mouth daily with breakfast.        . furosemide (LASIX) 40 MG tablet Take 40 mg by mouth 2 (two) times daily.        Marland Kitchen levothyroxine (SYNTHROID, LEVOTHROID) 200 MCG tablet Take 200 mcg by mouth every morning.        . pantoprazole (PROTONIX) 40 MG tablet Take 40 mg by mouth daily.        . polyethylene glycol (MIRALAX / GLYCOLAX) packet Take 17 g by mouth daily.        . ramipril (ALTACE) 10 MG capsule Take 10 mg by mouth every morning.        . warfarin (COUMADIN) 5 MG tablet Take 5 mg by mouth. As directed        Allergies  Allergen Reactions  . Codeine   . Morphine And Related   . Statins     History   Social History  . Marital Status: Widowed      Spouse Name: N/A    Number of Children: N/A  . Years of Education: N/A   Occupational History  . Not on file.   Social History Main Topics  . Smoking status: Former Smoker    Quit date: 02/14/1943  . Smokeless tobacco: Not on file  . Alcohol Use: No  . Drug Use: No  . Sexually Active: Not on file   Other Topics Concern  . Not on file   Social History Narrative  . No narrative on file     Physical Exam: Filed Vitals:   03/19/11 1558  BP: 110/72  Pulse: 70  Height: 5' 1.5" (1.562 m)  Weight: 209 lb 6.4 oz (94.983 kg)    GEN- The patient is elderly appearing, alert and oriented x 3 today.   Head- normocephalic, atraumatic Eyes-  Sclera clear, conjunctiva pink Ears- hearing intact Oropharynx- clear Neck- supple, no JVP Lymph- no cervical lymphadenopathy Lungs- Clear to ausculation bilaterally, normal work of breathing Heart- irregular rate and rhythm, no murmurs, rubs or gallops, PMI not laterally displaced GI- soft, NT, ND, + BS Extremities- no clubbing, cyanosis, 1+ BLE edema MS- no significant deformity or atrophy Skin- no rash or lesion Psych- euthymic  mood, full affect Neuro- strength and sensation are intact  ekg today reveals afib, V rate 70 bpm, nonspecific ST/T changes, LAD Event monitor reviewed  Assessment and Plan:

## 2011-03-26 LAB — URINALYSIS, ROUTINE W REFLEX MICROSCOPIC
Hgb urine dipstick: NEGATIVE
Nitrite: NEGATIVE
Protein, ur: 100 — AB
Urobilinogen, UA: 1

## 2011-03-26 LAB — CBC
HCT: 40.1
Hemoglobin: 13.6
MCV: 92.9
WBC: 8

## 2011-03-26 LAB — DIFFERENTIAL
Basophils Absolute: 0
Basophils Relative: 0
Monocytes Absolute: 0.5
Neutro Abs: 5.7
Neutrophils Relative %: 72

## 2011-03-26 LAB — COMPREHENSIVE METABOLIC PANEL
Alkaline Phosphatase: 60
BUN: 13
CO2: 25
Chloride: 104
GFR calc non Af Amer: >60
Glucose, Bld: 100 — ABNORMAL HIGH
Potassium: 3.9
Total Bilirubin: 1.2

## 2011-03-26 LAB — LIPASE, BLOOD: Lipase: 32

## 2011-03-26 LAB — URINE MICROSCOPIC-ADD ON

## 2011-03-27 ENCOUNTER — Inpatient Hospital Stay (HOSPITAL_COMMUNITY)
Admission: EM | Admit: 2011-03-27 | Discharge: 2011-03-31 | DRG: 389 | Disposition: A | Discharge status: Home Health | Payer: Medicare Other | Attending: Internal Medicine | Admitting: Internal Medicine

## 2011-03-27 ENCOUNTER — Emergency Department (HOSPITAL_COMMUNITY): Payer: Medicare Other

## 2011-03-27 DIAGNOSIS — Z853 Personal history of malignant neoplasm of breast: Secondary | ICD-10-CM

## 2011-03-27 DIAGNOSIS — Z8541 Personal history of malignant neoplasm of cervix uteri: Secondary | ICD-10-CM

## 2011-03-27 DIAGNOSIS — Z79899 Other long term (current) drug therapy: Secondary | ICD-10-CM

## 2011-03-27 DIAGNOSIS — Z7901 Long term (current) use of anticoagulants: Secondary | ICD-10-CM

## 2011-03-27 DIAGNOSIS — E039 Hypothyroidism, unspecified: Secondary | ICD-10-CM | POA: Diagnosis present

## 2011-03-27 DIAGNOSIS — E785 Hyperlipidemia, unspecified: Secondary | ICD-10-CM | POA: Diagnosis present

## 2011-03-27 DIAGNOSIS — D72829 Elevated white blood cell count, unspecified: Secondary | ICD-10-CM | POA: Diagnosis present

## 2011-03-27 DIAGNOSIS — Z23 Encounter for immunization: Secondary | ICD-10-CM

## 2011-03-27 DIAGNOSIS — I5032 Chronic diastolic (congestive) heart failure: Secondary | ICD-10-CM | POA: Diagnosis present

## 2011-03-27 DIAGNOSIS — I509 Heart failure, unspecified: Secondary | ICD-10-CM | POA: Diagnosis present

## 2011-03-27 DIAGNOSIS — K56609 Unspecified intestinal obstruction, unspecified as to partial versus complete obstruction: Principal | ICD-10-CM | POA: Diagnosis present

## 2011-03-27 DIAGNOSIS — E860 Dehydration: Secondary | ICD-10-CM | POA: Diagnosis present

## 2011-03-27 DIAGNOSIS — E119 Type 2 diabetes mellitus without complications: Secondary | ICD-10-CM | POA: Diagnosis present

## 2011-03-27 DIAGNOSIS — Z8551 Personal history of malignant neoplasm of bladder: Secondary | ICD-10-CM

## 2011-03-27 DIAGNOSIS — I10 Essential (primary) hypertension: Secondary | ICD-10-CM | POA: Diagnosis present

## 2011-03-27 DIAGNOSIS — Z8543 Personal history of malignant neoplasm of ovary: Secondary | ICD-10-CM

## 2011-03-27 DIAGNOSIS — I4891 Unspecified atrial fibrillation: Secondary | ICD-10-CM | POA: Diagnosis present

## 2011-03-27 LAB — URINALYSIS, ROUTINE W REFLEX MICROSCOPIC
Glucose, UA: NEGATIVE mg/dL
Ketones, ur: 15 mg/dL — AB
pH: 5 (ref 5.0–8.0)

## 2011-03-27 LAB — DIFFERENTIAL
Basophils Absolute: 0 10*3/uL (ref 0.0–0.1)
Basophils Relative: 0 % (ref 0–1)
Monocytes Absolute: 0.8 10*3/uL (ref 0.1–1.0)
Neutro Abs: 10 10*3/uL — ABNORMAL HIGH (ref 1.7–7.7)

## 2011-03-27 LAB — COMPREHENSIVE METABOLIC PANEL
AST: 32 U/L (ref 0–37)
Albumin: 4.6 g/dL (ref 3.5–5.2)
Chloride: 94 mEq/L — ABNORMAL LOW (ref 96–112)
Creatinine, Ser: 0.88 mg/dL (ref 0.50–1.10)
Potassium: 3.7 mEq/L (ref 3.5–5.1)
Total Bilirubin: 0.8 mg/dL (ref 0.3–1.2)

## 2011-03-27 LAB — CBC
Hemoglobin: 18.1 g/dL — ABNORMAL HIGH (ref 12.0–15.0)
MCHC: 35.1 g/dL (ref 30.0–36.0)
RBC: 5.57 MIL/uL — ABNORMAL HIGH (ref 3.87–5.11)
WBC: 11.9 10*3/uL — ABNORMAL HIGH (ref 4.0–10.5)

## 2011-03-27 LAB — URINE MICROSCOPIC-ADD ON

## 2011-03-27 LAB — PROTIME-INR
INR: 2.21 — ABNORMAL HIGH (ref 0.00–1.49)
Prothrombin Time: 24.9 seconds — ABNORMAL HIGH (ref 11.6–15.2)

## 2011-03-28 LAB — BASIC METABOLIC PANEL
BUN: 20 mg/dL (ref 6–23)
CO2: 30 mEq/L (ref 19–32)
Calcium: 8.6 mg/dL (ref 8.4–10.5)
Creatinine, Ser: 0.82 mg/dL (ref 0.50–1.10)
Glucose, Bld: 96 mg/dL (ref 70–99)

## 2011-03-28 LAB — CBC
MCH: 30.7 pg (ref 26.0–34.0)
MCHC: 32.7 g/dL (ref 30.0–36.0)
MCV: 93.7 fL (ref 78.0–100.0)
Platelets: 176 10*3/uL (ref 150–400)
RDW: 14.8 % (ref 11.5–15.5)
WBC: 7.7 10*3/uL (ref 4.0–10.5)

## 2011-03-29 DIAGNOSIS — I4891 Unspecified atrial fibrillation: Secondary | ICD-10-CM

## 2011-03-29 LAB — BASIC METABOLIC PANEL
CO2: 29 mEq/L (ref 19–32)
Calcium: 8.7 mg/dL (ref 8.4–10.5)
Chloride: 106 mEq/L (ref 96–112)
Creatinine, Ser: 0.73 mg/dL (ref 0.50–1.10)
Glucose, Bld: 85 mg/dL (ref 70–99)
Sodium: 141 mEq/L (ref 135–145)

## 2011-03-29 LAB — CBC
HCT: 40.5 % (ref 36.0–46.0)
MCH: 31.3 pg (ref 26.0–34.0)
MCV: 93.8 fL (ref 78.0–100.0)
Platelets: 149 10*3/uL — ABNORMAL LOW (ref 150–400)
RDW: 14.8 % (ref 11.5–15.5)

## 2011-03-29 LAB — GLUCOSE, CAPILLARY: Glucose-Capillary: 81 mg/dL (ref 70–99)

## 2011-03-30 LAB — GLUCOSE, CAPILLARY: Glucose-Capillary: 118 mg/dL — ABNORMAL HIGH (ref 70–99)

## 2011-03-30 LAB — PROTIME-INR
INR: 2.59 — ABNORMAL HIGH (ref 0.00–1.49)
Prothrombin Time: 28.2 seconds — ABNORMAL HIGH (ref 11.6–15.2)

## 2011-03-30 NOTE — Consult Note (Signed)
[name], [name]:  [id number]  MEDICAL RECORD NO.:  [id number]  LOCATION:  2005                         FACILITY:  [facility]  PHYSICIAN:  Verne Carrow, MDDATE OF BIRTH:  03-26-1920  DATE OF CONSULTATION: DATE OF DISCHARGE:                                CONSULTATION   PRIMARY CARDIOLOGIST:  Hillis Range, MD  PRIMARY MD:  Anna Genre. Little, MD  CHIEF COMPLAINT:  Admitted for partial small bowel obstruction.  HISTORY OF PRESENT ILLNESS:  This is an 77 year old [place] female with a history of paroxysmal atrial fibrillation on warfarin, hypothyroidism, type 2 diabetes that is diet controlled, hypertension, and multiple past abdominal surgeries and now presenting with frequent episodes of recurrent small bowel obstruction.  She was admitted after presenting with a partial small bowel obstruction on March 26, 2000, with symptoms of nausea, vomiting and abdominal pain.  During her hospitalization, her symptoms have been improving with supportive care. Our service was asked to consult on this patient secondary to her paroxysmal atrial fibrillation.  Her heart rate was in the 130s yesterday afternoon.  The patient was asymptomatic.  She was started on a Cardizem drip at that time, which has been discontinued this morning and changed to oral Cardizem after she presented with a few asymptomatic pauses on telemetry overnight.  This morning, the patient reports feeling much better overall compared to when she was admitted.  Her GI symptoms are significantly improved.  She does not complain of any cardiac symptoms including no chest pain or palpitations, but she does complain of occasional fatigue that she thinks may have been when her heart rhythm becomes irregular.  ALLERGIES:  CONTRAST, CODEINE, MORPHINE, STATINS.  HOME MEDICATIONS: 1. Warfarin 2.5 to 5 mg p.o. daily. 2. Synthroid 200 mcg p.o. daily. 3. Lasix 40-80 mg p.o. b.i.d. 4. Iron. 5.  Diltiazem CD 360 mg p.o. q.a.m. 6. Ramipril 10 mg p.o. q.a.m.  PAST MEDICAL HISTORY: 1. Atrial fibrillation diagnosed in May 2012, during a hospitalization     for small bowel obstruction.  She was shocked one time, however,     she went back into atrial fibrillation.  She has never had a     cardiac catheterization.  Echo at that time showed an ejection     fraction of 55-60% with significant left atrial dilatation. 2. Recurrent small bowel obstruction secondary to multiple abdominal     surgeries. 3. Hypothyroidism. 4. Hypertension. 5. Type 2 diabetes diet controlled. 6. History of multiple cancers including cervical, breast, bladder,     and ovarian.  SOCIAL HISTORY:  The patient lives with her daughter in [place].  She graduated from high school and is now retired.  She held multiple different jobs while she was still working including working at [employer].  She is a widow with 3 children.  She used to smoke 3 packs per day x26 years, however, quit in 1968.  She denies alcohol or illicit drugs.  FAMILY HISTORY:  Mother had diabetes and died from renal mainly due to complications from her diabetes.  Father died in his 16s of old age.  REVIEW OF SYSTEMS:  Per HPI with inclusion of following.  Denies  fevers, chills, sweats, orthopnea, hematuria, bright red blood per rectum, hematemesis.  She does complain of occasional shortness of breath.  PHYSICAL EXAMINATION:  VITAL SIGNS:  Temperature 96.8, T max 98.1, pulse 70 ranging from 50-130s over the past 24 hours, respiratory rate 18, blood pressure 121/71 ranging from 90-140s over 40s to 90s over the past 24 hours satting 98% on room air. GENERAL:  Not apparent distress. HEENT:  Nares without discharge.  Dentition poor, missing a few teeth. NECK:  Supple without lymphadenopathy or bruits.  JVP around 7 cm. CARDIOVASCULAR:  Regular heart rate, irregularly irregular rhythm, 1+ radial and pedal pulses. LUNGS:  Clear to  auscultation bilaterally with no wheezes, rales or rhonchi. SKIN:  No rashes. ABDOMEN:  Normoactive bowel sounds, markedly distended, nontender. EXTREMITIES:  Bilateral lower extremity venous stasis skin changes with edema that is nonpitting. NEURO:  Fully alert and oriented with no focal deficits.  RADIOLOGY:  Abdominal x-ray on March 27, 2011, showed a prominent loop of small bowel in the mid pelvis with no free air.  LABORATORY DATA:  CBC white blood count 5.2, hemoglobin 13.5, platelets 149.  BMET sodium 141, potassium 4.0, chloride 106, bicarb 29, creatinine 0.73, INR 2.88.  ASSESSMENT AND PLAN:  This is an 77 year old female with a history of paroxysmal atrial fibrillation on warfarin at home, hypothyroidism, diet controlled type 2 diabetes, hypertension, and recurrent small-bowel obstructions admitted for a partial small bowel obstruction that is resolving with supportive management.  We were asked to consult regarding her atrial fibrillation which now seems persistent.  Her Cardizem drip was discontinued last night and she is now on oral Cardizem CD at 240 mg which is lower than her home dose of 360 mg.  We think it is an appropriate plan to continue with the Cardizem at the lower dose.  We can titrate tomorrow if necessary if her blood pressure is tolerating the current Cardizem.  We recommend continue the warfarin since she is not having any symptoms from this. No further recommendations at this time.  We will continue to follow this patient with the primary care team.    ______________________________ Priscella Mann, MD   ______________________________ Verne Carrow, MD    AO/MEDQ  D:  03/29/2011  T:  03/29/2011  Job:  [id number]  Electronically Signed by Priscella Mann MD on 03/29/2011 02:05:31 PM Electronically Signed by Verne Carrow MD on 03/30/2011 03:07:49 PM

## 2011-03-31 LAB — CBC
Hemoglobin: 13.7 g/dL (ref 12.0–15.0)
MCH: 31.4 pg (ref 26.0–34.0)
MCHC: 34 g/dL (ref 30.0–36.0)
MCV: 92.4 fL (ref 78.0–100.0)
RBC: 4.36 MIL/uL (ref 3.87–5.11)

## 2011-03-31 LAB — BASIC METABOLIC PANEL
BUN: 8 mg/dL (ref 6–23)
CO2: 28 mEq/L (ref 19–32)
Chloride: 103 mEq/L (ref 96–112)
GFR calc non Af Amer: >60 mL/min (ref >60)
Glucose, Bld: 87 mg/dL (ref 70–99)
Potassium: 3.4 mEq/L — ABNORMAL LOW (ref 3.5–5.1)
Sodium: 141 mEq/L (ref 135–145)

## 2011-03-31 LAB — GLUCOSE, CAPILLARY

## 2011-03-31 LAB — PROTIME-INR: Prothrombin Time: 29.7 seconds — ABNORMAL HIGH (ref 11.6–15.2)

## 2011-04-06 LAB — COMPREHENSIVE METABOLIC PANEL
ALT: 22 U/L (ref 0–35)
Albumin: 4.2 g/dL (ref 3.5–5.2)
Alkaline Phosphatase: 62 U/L (ref 39–117)
BUN: 13 mg/dL (ref 6–23)
GFR calc Af Amer: >60 mL/min (ref >60)
Potassium: 3.4 mEq/L — ABNORMAL LOW (ref 3.5–5.1)
Sodium: 141 mEq/L (ref 135–145)
Total Protein: 7.2 g/dL (ref 6.0–8.3)

## 2011-04-06 LAB — GLUCOSE, CAPILLARY: Glucose-Capillary: 149 mg/dL — ABNORMAL HIGH (ref 70–99)

## 2011-04-06 LAB — DIFFERENTIAL
Basophils Relative: 0 % (ref 0–1)
Monocytes Absolute: 0.5 10*3/uL (ref 0.1–1.0)
Monocytes Relative: 5 % (ref 3–12)
Neutro Abs: 8 10*3/uL — ABNORMAL HIGH (ref 1.7–7.7)

## 2011-04-06 LAB — URINALYSIS, ROUTINE W REFLEX MICROSCOPIC
Ketones, ur: NEGATIVE mg/dL
Nitrite: NEGATIVE
Protein, ur: NEGATIVE mg/dL
Urobilinogen, UA: 0.2 mg/dL (ref 0.0–1.0)

## 2011-04-06 LAB — CBC
Platelets: 238 10*3/uL (ref 150–400)
RDW: 14.3 % (ref 11.5–15.5)

## 2011-04-10 LAB — URINALYSIS, ROUTINE W REFLEX MICROSCOPIC
Bilirubin Urine: NEGATIVE
Glucose, UA: NEGATIVE
Protein, ur: >300 — AB
Specific Gravity, Urine: 1.013
Urobilinogen, UA: 2 — ABNORMAL HIGH

## 2011-04-10 LAB — COMPREHENSIVE METABOLIC PANEL
ALT: 19
AST: 27
AST: 32
Albumin: 2.3 — ABNORMAL LOW
Alkaline Phosphatase: 46
Alkaline Phosphatase: 55
BUN: 25 — ABNORMAL HIGH
CO2: 25
Calcium: 7.3 — ABNORMAL LOW
Chloride: 98
Creatinine, Ser: 1.73 — ABNORMAL HIGH
GFR calc Af Amer: 34 — ABNORMAL LOW
GFR calc Af Amer: 45 — ABNORMAL LOW
GFR calc non Af Amer: 37 — ABNORMAL LOW
Potassium: 3.4 — ABNORMAL LOW
Sodium: 134 — ABNORMAL LOW
Total Bilirubin: 1.7 — ABNORMAL HIGH
Total Protein: 4.8 — ABNORMAL LOW

## 2011-04-10 LAB — CBC
HCT: 40
Hemoglobin: 11.8 — ABNORMAL LOW
Hemoglobin: 11.9 — ABNORMAL LOW
Hemoglobin: 12
Hemoglobin: 14.2
MCHC: 33
MCHC: 33.3
MCHC: 33.5
MCHC: 33.6
MCHC: 33.9
MCHC: 34.2
MCHC: 34.6
MCV: 92.3
MCV: 92.6
MCV: 93.1
MCV: 93.3
MCV: 94.7
MCV: 95
Platelets: 149 — ABNORMAL LOW
Platelets: 168
Platelets: 197
Platelets: ADEQUATE
RBC: 3.73 — ABNORMAL LOW
RBC: 3.76 — ABNORMAL LOW
RBC: 3.81 — ABNORMAL LOW
RBC: 4.32
RBC: 4.51
RBC: 4.8
RDW: 15.2
RDW: 15.3 — ABNORMAL HIGH
RDW: 15.3 — ABNORMAL HIGH
RDW: 15.5 — ABNORMAL HIGH
RDW: 15.5 — ABNORMAL HIGH
WBC: 10.7 — ABNORMAL HIGH
WBC: 10.7 — ABNORMAL HIGH
WBC: 16.7 — ABNORMAL HIGH
WBC: 6.9
WBC: 7
WBC: 8.2

## 2011-04-10 LAB — BASIC METABOLIC PANEL
BUN: 13
CO2: 24
CO2: 25
CO2: 29
CO2: 30
CO2: 30
Calcium: 7.6 — ABNORMAL LOW
Calcium: 7.9 — ABNORMAL LOW
Chloride: 100
Chloride: 101
Chloride: 105
Chloride: 106
Creatinine, Ser: 1.05
Creatinine, Ser: 1.1
Creatinine, Ser: 1.16
Creatinine, Ser: 1.29 — ABNORMAL HIGH
GFR calc Af Amer: 30 — ABNORMAL LOW
GFR calc Af Amer: 41 — ABNORMAL LOW
GFR calc Af Amer: 48 — ABNORMAL LOW
GFR calc Af Amer: 54 — ABNORMAL LOW
GFR calc Af Amer: 57 — ABNORMAL LOW
GFR calc Af Amer: >60
GFR calc non Af Amer: 25 — ABNORMAL LOW
GFR calc non Af Amer: 47 — ABNORMAL LOW
Glucose, Bld: 110 — ABNORMAL HIGH
Glucose, Bld: 125 — ABNORMAL HIGH
Potassium: 3.1 — ABNORMAL LOW
Potassium: 3.2 — ABNORMAL LOW
Potassium: 3.8
Sodium: 136
Sodium: 136
Sodium: 137
Sodium: 139

## 2011-04-10 LAB — CARDIAC PANEL(CRET KIN+CKTOT+MB+TROPI)
CK, MB: 3.1
CK, MB: 4.1 — ABNORMAL HIGH
CK, MB: 7.1 — ABNORMAL HIGH
Relative Index: 0.7
Relative Index: 2.4
Total CK: 1099 — ABNORMAL HIGH
Total CK: 616 — ABNORMAL HIGH
Troponin I: 0.06
Troponin I: 0.06
Troponin I: 0.09 — ABNORMAL HIGH

## 2011-04-10 LAB — B-NATRIURETIC PEPTIDE (CONVERTED LAB)
Pro B Natriuretic peptide (BNP): 137 — ABNORMAL HIGH
Pro B Natriuretic peptide (BNP): 227 — ABNORMAL HIGH

## 2011-04-10 LAB — CULTURE, BLOOD (ROUTINE X 2): Culture: NO GROWTH

## 2011-04-10 LAB — MAGNESIUM: Magnesium: 1.6

## 2011-04-10 LAB — DIFFERENTIAL
Basophils Absolute: 0
Basophils Relative: 0
Basophils Relative: 0
Eosinophils Absolute: 0
Lymphocytes Relative: 7 — ABNORMAL LOW
Monocytes Absolute: 0.8 — ABNORMAL HIGH
Neutro Abs: 14.4 — ABNORMAL HIGH
Neutro Abs: 15.6 — ABNORMAL HIGH
Neutrophils Relative %: 87 — ABNORMAL HIGH

## 2011-04-10 LAB — URINE MICROSCOPIC-ADD ON

## 2011-04-10 LAB — PROTIME-INR
INR: 1.2
INR: 1.2
Prothrombin Time: 15.4 — ABNORMAL HIGH
Prothrombin Time: 15.4 — ABNORMAL HIGH

## 2011-04-10 LAB — URINE CULTURE: Colony Count: >=100000

## 2011-04-10 LAB — SODIUM, URINE, RANDOM: Sodium, Ur: <10

## 2011-04-10 LAB — HEMOGLOBIN A1C
Hgb A1c MFr Bld: 5.6
Mean Plasma Glucose: 122

## 2011-04-10 LAB — CREATININE, URINE, RANDOM: Creatinine, Urine: 157.9

## 2011-04-10 LAB — CK: Total CK: 283 — ABNORMAL HIGH

## 2011-04-12 NOTE — Discharge Summary (Signed)
NAMEDICIE, EDELEN NO.:  [id number]  MEDICAL RECORD NO.:  [id number]  LOCATION:  2019                         FACILITY:  [facility]  PHYSICIAN:  Altha Harm, MDDATE OF BIRTH:  1920/01/05  DATE OF ADMISSION:  03/07/2011 DATE OF DISCHARGE:  03/09/2011                              DISCHARGE SUMMARY   DISCHARGE DISPOSITION:  Home.  FINAL DISCHARGE DIAGNOSES: 1. Nausea and vomiting resolved. 2. Constipation resolved. 3. Atrial fibrillation with rapid ventricular response, heart rate now     controlled. 4. Mild dehydration resolved. 5. Chronic diastolic heart failure, stable. 6. Diabetes type 2 diet controlled. 7. Hypothyroidism. 8. Hypertension. 9. History of bladder cancer. 10.Chronic anticoagulation.  INRs subtherapeutic on admission, are now     therapeutic at the time of discharge. 11.History of bladder cancer treated with bladder radiation. 12.History of partial small bowel obstructions in the past. 13.History of obstructive sleep apnea.  DISCHARGE MEDICATIONS: 1. MiraLax 17 g p.o. daily. 2. Altace 10 mg p.o. daily. 3. Bisacodyl rectal suppository 10 mg rectally every evening. 4. Diltiazem CD 360 mg p.o. daily. 5. Iron 65 mg p.o. b.i.d. 6. Lasix 40 mg p.o. b.i.d. 7. Protonix 40 mg p.o. daily. 8. Synthroid 200 mcg p.o. daily. 9. Warfarin 5 mg daily except on Tuesday and Thursdays when the     patient takes 2.5 mg.  The patient to have her INR checked on     Monday, March 12, 2011.  CONSULTANTS:  None.  PROCEDURE:  None.  DIAGNOSTIC STUDIES: 1. Acute abdominal x-ray which shows stable cardiomegaly.  No active     lung disease.  Impression:  No bowel obstruction.  No free air. 2. Repeat abdominal x-ray which shows a nonobstructive bowel gas     pattern.  PRIMARY CARE PHYSICIAN:  Dr. Clarene Duke at [facility].  CODE STATUS:  Limited code.  No CPR, no defibrillation, no intubation. However, able to use pressors.  ALLERGIES: 1.  CODEINE. 2. MORPHINE. 3. STATINS.  CHIEF COMPLAINT:  Nausea and vomiting x1 day.  HISTORY OF PRESENT ILLNESS:  Please refer to the H and P for details of the HPI.  However, [name] is a very lovely 77 year old female who presented to the emergency room after having nausea and vomiting overnight.  The patient had normal meals a day before, went to bed and woke up with vomiting.  The patient also had some abdominal distention which her daughter reports is greater than usual.  The patient was referred to [facility] for further evaluation and management.  HOSPITAL COURSE: 1. Nausea and vomiting.  The patient had nausea and vomiting and an     obvious ileus.  However, the patient did not had findings of a     radiological ileus.  She was given a bowel rest and then started on     clear liquids and advanced to a diabetic heart-healthy diet which     she tolerated it well.  The patient also had some constipation     which I felt was contributing to this.  The patient was given a GI     regimen for bowel evacuation.  The patient had a very large  stool     and was able to tolerate her diet without difficulty.     Additionally, the patient was felt to be mildly dehydrated, her     Lasix was held for a day.  She was given gentle rehydration.  When     the patient started tolerating her diet,  all IV fluids were     stopped.  Presently, the patient is tolerating her diet.  She has good elimination without any difficulty.  She has no abdominal pain and her abdominal distention is resolved. 1. Atrial fibrillation with rapid ventricular response.  The patient     is known to have atrial fibrillation.  Upon arrival to the     hospital, she had a rapid ventricular rate up to 140.  It was felt     that this was contributed by the stress of her vomiting in addition     to some mild volume depletion.  The patient was volume resuscitated     and started on Cardizem drip.  Once her  heart rate was controlled,     she was transitioned from the Cardizem drip to oral Cardizem.  The     patient is maintaining her heart rate mostly in the 70s.  She does     have some spikes into the low 100s with activity.  I would     recommend that if the patient requires further control of her heart     rate that digoxin be used at this patient as she did have some     decrease in her blood pressure and runs the risk of orthostatic     hypotension given her advanced age. 2. Diabetes type 2.  This was diet controlled.  Here in the hospital,     the patient's blood sugars were well controlled.  The patient had a     hemoglobin A1c ordered.  However, she was very difficult to take in     thus this was abandoned in the interest of the patient's comfort,     particularly since her blood sugars were controlled and there was     no necessity for urgent acute intervention. 3. Hypothyroidism.  The patient's TSH was evaluated.  She was felt to     be adequately replaced.  The patient has been seen by Physical     Therapy.  She is ambulatory without any further needs for physical     therapy.  PHYSICAL EXAMINATION:  GENERAL:  At the time of discharge, the patient is stable.  She is ambulating without any difficulty.  She has been able to tolerate her diet and had a large bowel movement today. VITAL SIGNS:  Temperature is 98.7, heart rate mostly 73, occasionally it went to 110s with activity.  Respiratory rate 20, blood pressure 130/76, O2 sats are 96% on room air. HEENT:  She is normocephalic, atraumatic.  Pupils are equally round and reactive to light and accommodation.  Extraocular movements are intact. The oropharynx is moist.  No exudate, erythema or lesions are noted. NECK:  Trachea is midline.  No masses, no thyromegaly, no JVD, no carotid bruit. ABDOMEN:  On x-ray, the patient has a baseline distended abdomen, however, it is soft, nontender, no masses, no hepatosplenomegaly  is noted. EXTREMITIES:  No clubbing, cyanosis or edema.  At the time of discharge, the patient's weight is 93.3 kg.  PERTINENT LABORATORY STUDIES:  Her white blood cell count is 6.5, hemoglobin 13.9, hematocrit 41.7, platelet  count 169, INR is 2.10. Sodium 141, potassium 3.8, chloride 105, bicarb 26, BUN 11, creatinine 0.63.  DIETARY RESTRICTIONS:  The patient should be on a diabetic heart-healthy diet.  PHYSICAL RESTRICTIONS:  Activity as tolerated.  FOLLOWUP LABORATORY DATA:  The patient is to have her INR checked on Monday.  Home Health RN will check the INRs and send the results to Dr. Fredirick Maudlin office for further titration of her Coumadin.  Total time for this discharge process including face-to-face time is approximately 41 minutes.     Altha Harm, MD     MAM/MEDQ  D:  03/09/2011  T:  03/10/2011  Job:  [id number]  cc:   Dr. Clarene Duke  Electronically Signed by Marthann Schiller MD on 04/12/2011 07:18:20 AM

## 2011-04-12 NOTE — H&P (Signed)
[name], [name]:  [id number]  MEDICAL RECORD NO.:  [id number]  LOCATION:  [facility]                         FACILITY:  [facility]  PHYSICIAN:  Altha Harm, MDDATE OF BIRTH:  07-21-1919  DATE OF ADMISSION:  03/07/2011 DATE OF DISCHARGE:                             HISTORY & PHYSICAL   CHIEF COMPLAINT:  Nausea and vomiting x1 day.  HISTORY OF PRESENT ILLNESS:  [name] is an very lovely 77 year old female who lives with her daughter and has another daughter who is a Engineer, civil (consulting) here at [facility].  According to her daughter [name], the patient started having nausea and vomiting last night.  The patient apparently ate her normal meals yesterday, went to bed and then woke up with nausea and vomiting.  It was also noted that she had abdominal distention which her daughter relates that is more than the usual amount of distention that she has.  The patient herself is unable to give very much detail about the contents of the emesis.  However, appears to be nonbilious, non hematemesis.  She denies any abdominal pain.  She denies any chest pain.  She denies any diarrhea.  She states that her last bowel movement was yesterday.  She had 2 bowel movements yesterday, but has had none today.  The patient states that she typically has bowel movements on a daily basis.  She has had no fever, no chills.  She has had no dizziness, seizures, loss of conscious.  She has had no dysuria.  She has had no urgency or frequency.  PAST MEDICAL HISTORY:  Significant for the following: 1. Diabetes type 2 diet controlled. 2. Hypertension. 3. History of bladder cancer treated with bladder radiation. 4. History of partial small-bowel obstructions in the past. 5. History of obstructive sleep apnea. 6. Hypothyroidism. 7. History of atrial fibrillation, status post cardioversion. 8. History of chronic diastolic heart failure.  FAMILY HISTORY:  Significant for diabetes and CHF  in her mother and for renal failure in her siblings.  The patient's daughter states that there are no chronic illnesses in her children.  SOCIAL HISTORY:  The patient lives with her daughter who is not present. Both daughters have healthcare power of attorney and her daughter who is present here with her is an R.N. who is part of her IV team here and can be reached per the IV phone.  She can also be reached on her cell phone at [phone].  The patient denies any tobacco, alcohol or drug use.  CODE STATUS:  The patient is a limited code.  No intubation, no defibrillation, and no cardioversion.  However, she does consent to the use of pressors and vasoactive medications as necessary.  CURRENT MEDICATIONS:  Include the following: 1. Altace 10 mg p.o. daily. 2. Diltiazem 360 mg p.o. daily. 3. Dulcolax 10 mg per rectum daily. 4. Iron 65 mg p.o. b.i.d. 5. Lasix 40 mg p.o. b.i.d. 6. Protonix 40 mg p.o. daily. 7. Synthroid 200 mcg p.o. daily. 8. The patient also uses warfarin, however, the dose at this time is     unknown.  STUDIES IN THE EMERGENCY ROOM:  Laboratory studies shows a white blood  cell count of 8.1, hemoglobin of 15.9, hematocrit of 46.3 and platelet count of 204.  Sodium 142, potassium 3.5, chloride 104, bicarb 27, BUN 70, creatinine 0.80.  INR is 1.94.  Urinalysis shows 7-10 WBCs, but is negative for nitrites.  Acute abdominal x-ray shows stable cardiomegaly, no active lung disease.  No bowel obstruction and no free air.  I spoke personally with Dr. Gery Pray the radiologist and he states that he sees no evidence of ileus on the radiologic study.  PHYSICAL EXAMINATION:  GENERAL:  The patient is an very pleasant 77 year old female who is lying in the bed.  Her daughter [name] is at the bedside.  The patient really is in no acute distress at this time and has no complaints except stating that she wants to eat. VITAL SIGNS:  Temperature is 97.5, heart rate is 121, blood  pressure 111/84, respiratory rate 18 and O2 sats are 96% on 2 L. HEENT:  She is normocephalic, atraumatic.  Pupils are equally round and reactive to light and accommodation.  Extraocular movements are intact. Oropharynx is moist.  No exudate, erythema or lesions are noted. NECK:  Trachea is midline.  No masses, no thyromegaly, no JVD and no carotid bruit. RESPIRATORY:  The patient has a normal respiratory effort, equal excursion bilaterally.  No wheezing or rhonchi noted. CARDIOVASCULAR:  She has got a normal S1 and S2.  No murmurs, rubs or gallops are noted.  PMI is nondisplaced.  No heaves or thrills on palpation. ABDOMEN:  Obese, distended, soft.  No masses, no hepatosplenomegaly. The patient does complain of some tenderness in the right lower quadrant.  However, it is a very vague tenderness.  The patient has no suprapubic tenderness noted, however. EXTREMITIES:  The patient has trace to 1+ edema in a bilateral lower extremity. MUSCULOSKELETAL:  She has got no warm swelling or erythema around the joints.  There is no spinal tenderness noted. NEUROLOGICAL:  She had no focal neurological deficits.  Cranial nerves II through XII are grossly intact.  DTRs are 2+ bilaterally in the upper and lower extremities.  PSYCHIATRIC:  She is alert and oriented x3, good recent and remote recall, good insight and cognition.  ASSESSMENT AND PLAN:  This is a patient who presents with nausea and vomiting in the absence of a radiologic findings of an ileus.  However, the patient does clinically appear to have some form of ileus occurring. I will go ahead and start the patient on some clear liquid that she feels that she can tolerate some liquids.  We will see how she does with that.  If she does have any further vomiting, we will go ahead and make her n.p.o.  In terms of atrial fibrillation with therapy right now, the patient is unable to take her pills as she has not demonstrated tolerance with oral  intake.  This will put her on a Cardizem drip and titrate and keep her heart rate less than 110.  In terms of her diabetes, the patient was diet controlled and will go ahead and check her CBGs q.4 h. until she is eating and then check them a.c. and at bedtime with a sensitive sliding scale.  The patient has chronic diastolic CHF and is on Lasix.  However, the patient appears to be mildly dehydrated at this time.  The patient is getting gentle hydration and we will watch her closely for any fluid overload and stop her fluids and start her back on her Lasix as soon as  she is able to tolerate p.o. intake.  In terms of her urinalysis, the patient does have 7-10 WBCs in her urine.  However, I have reviewed her labs in the past and the patient appears to always have about 7-10 WBCs in her urine without any findings of a urinary tract infection on a urine culture, thus I will hold off on starting the patient on any antibiotics since the patient is clinically stable at this time.  The patient will be put back on her Synthroid for her hypothyroidism with a 50% IV dose and we will monitor her blood pressures and treat her with p.r.n. medications as needed until she is able to tolerate her pills.  The patient is on chronic anticoagulation for her atrial fibrillation and is currently subtherapeutic.  We will put the patient on Lovenox full dose 1 mg/kg b.i.d. at this time.  When the patient is able to tolerate, we will restart her Coumadin and titrate her up to a therapeutic dose of the Coumadin.  The patient will be on IV Protonix, though she does take it at home on a regular basis and we will continue in order to prevent any ulcerations.  At this point, I see no need to put an NG tube in this patient that she does not have any evidence of abdominal distention and we will treat conservatively until the nausea and vomiting resolves.     Altha Harm, MD     MAM/MEDQ  D:  03/07/2011  T:   03/07/2011  Job:  [id number]  cc:   Caryn Bee L. Little, M.D. [facility]  Electronically Signed by Marthann Schiller MD on 04/12/2011 07:18:15 AM

## 2011-05-02 ENCOUNTER — Ambulatory Visit: Payer: Medicare Other | Admitting: Internal Medicine

## 2011-05-07 ENCOUNTER — Ambulatory Visit: Payer: Medicare Other | Admitting: Internal Medicine

## 2011-05-30 ENCOUNTER — Ambulatory Visit (INDEPENDENT_AMBULATORY_CARE_PROVIDER_SITE_OTHER): Payer: Medicare Other | Admitting: Internal Medicine

## 2011-05-30 ENCOUNTER — Encounter: Payer: Self-pay | Admitting: Internal Medicine

## 2011-05-30 DIAGNOSIS — I5032 Chronic diastolic (congestive) heart failure: Secondary | ICD-10-CM

## 2011-05-30 DIAGNOSIS — I509 Heart failure, unspecified: Secondary | ICD-10-CM

## 2011-05-30 DIAGNOSIS — I4891 Unspecified atrial fibrillation: Secondary | ICD-10-CM

## 2011-05-30 NOTE — Assessment & Plan Note (Signed)
Stable No changes 

## 2011-05-30 NOTE — Assessment & Plan Note (Signed)
The patient is doing reasonably well with rate control.  She has declined AAD and cardioversion.  She is very clear that she would like to avoid any procedures if possible.  Given her advanced age, I think that this is a reasonable strategy. No changes today.

## 2011-05-30 NOTE — Progress Notes (Signed)
The patient presents today for routine cardiology followup.  She has stable longstanding persistent atrial fibrillation for which she has requested rate control long term. She remains minimally symptomatic.  She reports occasional fatigue but denies symptoms of palpitations, chest pain, shortness of breath, orthopnea, PND, dizziness, presyncope, syncope, or neurologic sequela.  She has stable edema.   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 The patient feels that she is tolerating medications without difficulties and is otherwise without complaint today.   Past Medical History  Diagnosis Date  . Diabetes mellitus   . Hypertension   . SBO (small bowel obstruction)   . Colon cancer   . Persistent atrial fibrillation    No past surgical history on file.  Current Outpatient Prescriptions  Medication Sig Dispense Refill  . bisacodyl (FLEET) 10 MG/30ML ENEM Place 10 mg rectally as needed.        . diltiazem (CARDIZEM CD) 360 MG 24 hr capsule Take 1 capsule (360 mg total) by mouth daily.      . ferrous sulfate 325 (65 FE) MG tablet Take 325 mg by mouth daily with breakfast.        . furosemide (LASIX) 40 MG tablet Take 40 mg by mouth 2 (two) times daily.        Marland Kitchen levothyroxine (SYNTHROID, LEVOTHROID) 200 MCG tablet Take 200 mcg by mouth every morning.        . ramipril (ALTACE) 10 MG capsule Take 10 mg by mouth every morning.        . warfarin (COUMADIN) 5 MG tablet Take 5 mg by mouth. As directed      . pantoprazole (PROTONIX) 40 MG tablet Take 40 mg by mouth daily.        . polyethylene glycol (MIRALAX / GLYCOLAX) packet Take 17 g by mouth daily.          Allergies  Allergen Reactions  . Codeine   . Morphine And Related   . Statins     History   Social History  . Marital Status: Widowed    Spouse Name: N/A    Number of Children: N/A  . Years of Education: N/A   Occupational History  . Not on file.   Social History Main Topics  . Smoking status: Former  Smoker    Quit date: 02/14/1943  . Smokeless tobacco: Not on file  . Alcohol Use: No  . Drug Use: No  . Sexually Active: Not on file   Other Topics Concern  . Not on file   Social History Narrative  . No narrative on file     Physical Exam: Filed Vitals:   05/30/11 1126  BP: 138/80  Pulse: 76  Resp: 18  Height: 5\' 1"  (1.549 m)  Weight: 240 lb (108.863 kg)    GEN- The patient is elderly appearing, alert and oriented x 3 today.   Head- normocephalic, atraumatic Eyes-  Sclera clear, conjunctiva pink Ears- hearing intact Oropharynx- clear Neck- supple, no JVP Lymph- no cervical lymphadenopathy Lungs- Clear to ausculation bilaterally, normal work of breathing Heart- irregular rate and rhythm, no murmurs, rubs or gallops, PMI not laterally displaced GI- soft, NT, ND, + BS Extremities- no clubbing, cyanosis, 1+ BLE edema MS- no significant deformity or atrophy Skin- no rash or lesion Psych- euthymic mood, full affect Neuro- strength and sensation are intact  Assessment and Plan:

## 2011-05-30 NOTE — Patient Instructions (Signed)
Your physician recommends that you schedule a follow-up appointment in 3 months with Dr Allred    

## 2011-07-06 DIAGNOSIS — Z7901 Long term (current) use of anticoagulants: Secondary | ICD-10-CM | POA: Diagnosis not present

## 2011-08-08 DIAGNOSIS — E119 Type 2 diabetes mellitus without complications: Secondary | ICD-10-CM | POA: Diagnosis not present

## 2011-08-08 DIAGNOSIS — I4891 Unspecified atrial fibrillation: Secondary | ICD-10-CM | POA: Diagnosis not present

## 2011-08-08 DIAGNOSIS — Z7901 Long term (current) use of anticoagulants: Secondary | ICD-10-CM | POA: Diagnosis not present

## 2011-08-08 DIAGNOSIS — E039 Hypothyroidism, unspecified: Secondary | ICD-10-CM | POA: Diagnosis not present

## 2011-08-08 DIAGNOSIS — H6120 Impacted cerumen, unspecified ear: Secondary | ICD-10-CM | POA: Diagnosis not present

## 2011-08-08 DIAGNOSIS — E785 Hyperlipidemia, unspecified: Secondary | ICD-10-CM | POA: Diagnosis not present

## 2011-08-08 DIAGNOSIS — Z79899 Other long term (current) drug therapy: Secondary | ICD-10-CM | POA: Diagnosis not present

## 2011-08-29 ENCOUNTER — Ambulatory Visit (INDEPENDENT_AMBULATORY_CARE_PROVIDER_SITE_OTHER): Payer: Medicare Other | Admitting: Internal Medicine

## 2011-08-29 ENCOUNTER — Encounter: Payer: Self-pay | Admitting: Internal Medicine

## 2011-08-29 VITALS — BP 142/78 | HR 93 | Ht 61.5 in | Wt 210.0 lb

## 2011-08-29 DIAGNOSIS — I509 Heart failure, unspecified: Secondary | ICD-10-CM

## 2011-08-29 DIAGNOSIS — I5032 Chronic diastolic (congestive) heart failure: Secondary | ICD-10-CM

## 2011-08-29 LAB — BASIC METABOLIC PANEL
BUN: 17 mg/dL (ref 6–23)
Calcium: 9.3 mg/dL (ref 8.4–10.5)
GFR: 89.47 mL/min (ref >60.00)
Glucose, Bld: 72 mg/dL (ref 70–99)

## 2011-08-29 NOTE — Assessment & Plan Note (Signed)
Rate control long term Goal INR 2-3

## 2011-08-29 NOTE — Assessment & Plan Note (Signed)
Continue lasix 2 gram sodium diet bmet today, will likely need K supplementation, though she eats a banana each day

## 2011-08-29 NOTE — Patient Instructions (Signed)
Your physician wants you to follow-up in: 6 months. You will receive a reminder letter in the mail two months in advance. If you don't receive a letter, please call our office to schedule the follow-up appointment.  LABS TODAY:  BMET  2 Gram Low Sodium Diet A 2 gram sodium diet restricts the amount of sodium in the diet to no more than 2 g or 2000 mg daily. Limiting the amount of sodium is often used to help lower blood pressure. It is important if you have heart, liver, or kidney problems. Many foods contain sodium for flavor and sometimes as a preservative. When the amount of sodium in a diet needs to be low, it is important to know what to look for when choosing foods and drinks. The following includes some information and guidelines to help make it easier for you to adapt to a low sodium diet. QUICK TIPS  Do not add salt to food.   Avoid convenience items and fast food.   Choose unsalted snack foods.   Buy lower sodium products, often labeled as "lower sodium" or "no salt added."   Check food labels to learn how much sodium is in 1 serving.   When eating at a restaurant, ask that your food be prepared with less salt or none, if possible.  READING FOOD LABELS FOR SODIUM INFORMATION The nutrition facts label is a good place to find how much sodium is in foods. Look for products with no more than 500 to 600 mg of sodium per meal and no more than 150 mg per serving. Remember that 2 g = 2000 mg. The food label may also list foods as:  Sodium-free: Less than 5 mg in a serving.   Very low sodium: 35 mg or less in a serving.   Low-sodium: 140 mg or less in a serving.   Light in sodium: 50% less sodium in a serving. For example, if a food that usually has 300 mg of sodium is changed to become light in sodium, it will have 150 mg of sodium.   Reduced sodium: 25% less sodium in a serving. For example, if a food that usually has 400 mg of sodium is changed to reduced sodium, it will have 300  mg of sodium.  CHOOSING FOODS Grains  Avoid: Salted crackers and snack items. Some cereals, including instant hot cereals. Bread stuffing and biscuit mixes. Seasoned rice or pasta mixes.   Choose: Unsalted snack items. Low-sodium cereals, oats, puffed wheat and rice, shredded wheat. English muffins and bread. Pasta.  Meats  Avoid: Salted, canned, smoked, spiced, pickled meats, including fish and poultry. Bacon, ham, sausage, cold cuts, hot dogs, anchovies.   Choose: Low-sodium canned tuna and salmon. Fresh or frozen meat, poultry, and fish.  Dairy  Avoid: Processed cheese and spreads. Cottage cheese. Buttermilk and condensed milk. Regular cheese.   Choose: Milk. Low-sodium cottage cheese. Yogurt. Sour cream. Low-sodium cheese.  Fruits and Vegetables  Avoid: Regular canned vegetables. Regular canned tomato sauce and paste. Frozen vegetables in sauces. Olives. Rosita Fire. Relishes. Sauerkraut.   Choose: Low-sodium canned vegetables. Low-sodium tomato sauce and paste. Frozen or fresh vegetables. Fresh and frozen fruit.  Condiments  Avoid: Canned and packaged gravies. Worcestershire sauce. Tartar sauce. Barbecue sauce. Soy sauce. Steak sauce. Ketchup. Onion, garlic, and table salt. Meat flavorings and tenderizers.   Choose: Fresh and dried herbs and spices. Low-sodium varieties of mustard and ketchup. Lemon juice. Tabasco sauce. Horseradish.  SAMPLE 2 GRAM SODIUM MEAL PLAN Breakfast /  Sodium (mg)  1 cup low-fat milk / 143 mg   2 slices whole-wheat toast / 270 mg   1 tbs heart-healthy margarine / 153 mg   1 hard-boiled egg / 139 mg   1 small orange / 0 mg  Lunch / Sodium (mg)  1 cup raw carrots / 76 mg    cup hummus / 298 mg   1 cup low-fat milk / 143 mg    cup red grapes / 2 mg   1 whole-wheat pita bread / 356 mg  Dinner / Sodium (mg)  1 cup whole-wheat pasta / 2 mg   1 cup low-sodium tomato sauce / 73 mg   3 oz lean ground beef / 57 mg   1 small side salad (1 cup  raw spinach leaves,  cup cucumber,  cup yellow bell pepper) with 1 tsp olive oil and 1 tsp red wine vinegar / 25 mg  Snack / Sodium (mg)  1 container low-fat vanilla yogurt / 107 mg   3 graham cracker squares / 127 mg  Nutrient Analysis  Calories: 2033   Protein: 77 g   Carbohydrate: 282 g   Fat: 72 g   Sodium: 1971 mg  Document Released: 06/18/2005 Document Revised: 02/28/2011 Document Reviewed: 09/19/2009 [facility] Patient Information 2012 [place], [place].

## 2011-08-29 NOTE — Progress Notes (Signed)
The patient presents today for routine cardiology followup.  She has stable longstanding persistent atrial fibrillation.  She remains minimally symptomatic.  She reports occasional fatigue but denies symptoms of palpitations, chest pain, shortness of breath, orthopnea, PND, dizziness, presyncope, syncope, or neurologic sequela.  She has stable edema.   She remains active despite her age. The patient feels that she is tolerating medications without difficulties and is otherwise without complaint today.   Past Medical History  Diagnosis Date  . Diabetes mellitus   . Hypertension   . SBO (small bowel obstruction)   . Colon cancer   . Persistent atrial fibrillation    No past surgical history on file.  Current Outpatient Prescriptions  Medication Sig Dispense Refill  . bisacodyl (FLEET) 10 MG/30ML ENEM Place 10 mg rectally as needed.        . diltiazem (CARDIZEM CD) 360 MG 24 hr capsule Take 1 capsule (360 mg total) by mouth daily.      . ferrous sulfate 325 (65 FE) MG tablet Take 325 mg by mouth daily with breakfast.        . furosemide (LASIX) 40 MG tablet Take 40 mg by mouth 2 (two) times daily.        Marland Kitchen levothyroxine (SYNTHROID, LEVOTHROID) 200 MCG tablet Take 200 mcg by mouth every morning.        . pantoprazole (PROTONIX) 40 MG tablet Take 40 mg by mouth daily.        . polyethylene glycol (MIRALAX / GLYCOLAX) packet Take 17 g by mouth daily.        . pravastatin (PRAVACHOL) 20 MG tablet Take 20 mg by mouth daily.      . ramipril (ALTACE) 10 MG capsule Take 10 mg by mouth every morning.        . warfarin (COUMADIN) 5 MG tablet Take 5 mg by mouth. As directed        Allergies  Allergen Reactions  . Codeine   . Morphine And Related   . Statins     History   Social History  . Marital Status: Widowed    Spouse Name: N/A    Number of Children: N/A  . Years of Education: N/A   Occupational History  . Not on file.   Social History Main Topics  . Smoking status: Former Smoker      Quit date: 02/14/1943  . Smokeless tobacco: Not on file  . Alcohol Use: No  . Drug Use: No  . Sexually Active: Not on file   Other Topics Concern  . Not on file   Social History Narrative  . No narrative on file     Physical Exam: Filed Vitals:   08/29/11 1200  BP: 142/78  Pulse: 93  Height: 5' 1.5" (1.562 m)  Weight: 210 lb (95.255 kg)    GEN- The patient is elderly appearing, alert and oriented x 3 today.   Head- normocephalic, atraumatic Eyes-  Sclera clear, conjunctiva pink Ears- hearing intact Oropharynx- clear Neck- supple, no JVP Lymph- no cervical lymphadenopathy Lungs- Clear to ausculation bilaterally, normal work of breathing Heart- irregular rate and rhythm, no murmurs, rubs or gallops, PMI not laterally displaced GI- soft, NT, ND, + BS Extremities- no clubbing, cyanosis, 1+ BLE edema MS- no significant deformity or atrophy Skin- no rash or lesion Psych- euthymic mood, full affect Neuro- strength and sensation are intact  ekg today reveals afib, V rate 93 bpm, nonspecific ST/T changes  Assessment and Plan:

## 2011-10-02 DIAGNOSIS — Z7901 Long term (current) use of anticoagulants: Secondary | ICD-10-CM | POA: Diagnosis not present

## 2011-10-05 DIAGNOSIS — M79609 Pain in unspecified limb: Secondary | ICD-10-CM | POA: Diagnosis not present

## 2011-10-05 DIAGNOSIS — B351 Tinea unguium: Secondary | ICD-10-CM | POA: Diagnosis not present

## 2011-11-20 ENCOUNTER — Emergency Department (HOSPITAL_COMMUNITY): Payer: Medicare Other

## 2011-11-20 ENCOUNTER — Emergency Department (HOSPITAL_COMMUNITY)
Admission: EM | Admit: 2011-11-20 | Discharge: 2011-11-20 | Disposition: A | Discharge status: Home / Self Care | Payer: Medicare Other | Attending: Emergency Medicine | Admitting: Emergency Medicine

## 2011-11-20 ENCOUNTER — Encounter (HOSPITAL_COMMUNITY): Payer: Self-pay | Admitting: *Deleted

## 2011-11-20 DIAGNOSIS — R1084 Generalized abdominal pain: Secondary | ICD-10-CM | POA: Diagnosis not present

## 2011-11-20 DIAGNOSIS — I4891 Unspecified atrial fibrillation: Secondary | ICD-10-CM | POA: Diagnosis not present

## 2011-11-20 DIAGNOSIS — R0989 Other specified symptoms and signs involving the circulatory and respiratory systems: Secondary | ICD-10-CM | POA: Diagnosis not present

## 2011-11-20 DIAGNOSIS — K560 Paralytic ileus: Secondary | ICD-10-CM | POA: Diagnosis not present

## 2011-11-20 DIAGNOSIS — K5900 Constipation, unspecified: Secondary | ICD-10-CM | POA: Insufficient documentation

## 2011-11-20 DIAGNOSIS — I10 Essential (primary) hypertension: Secondary | ICD-10-CM | POA: Diagnosis not present

## 2011-11-20 DIAGNOSIS — E119 Type 2 diabetes mellitus without complications: Secondary | ICD-10-CM | POA: Diagnosis not present

## 2011-11-20 DIAGNOSIS — K2970 Gastritis, unspecified, without bleeding: Secondary | ICD-10-CM | POA: Diagnosis not present

## 2011-11-20 DIAGNOSIS — R141 Gas pain: Secondary | ICD-10-CM | POA: Diagnosis not present

## 2011-11-20 DIAGNOSIS — R112 Nausea with vomiting, unspecified: Secondary | ICD-10-CM | POA: Diagnosis not present

## 2011-11-20 DIAGNOSIS — R609 Edema, unspecified: Secondary | ICD-10-CM | POA: Diagnosis not present

## 2011-11-20 DIAGNOSIS — I319 Disease of pericardium, unspecified: Secondary | ICD-10-CM | POA: Diagnosis not present

## 2011-11-20 DIAGNOSIS — R1900 Intra-abdominal and pelvic swelling, mass and lump, unspecified site: Secondary | ICD-10-CM | POA: Diagnosis not present

## 2011-11-20 DIAGNOSIS — R109 Unspecified abdominal pain: Secondary | ICD-10-CM | POA: Diagnosis not present

## 2011-11-20 DIAGNOSIS — Z85038 Personal history of other malignant neoplasm of large intestine: Secondary | ICD-10-CM | POA: Insufficient documentation

## 2011-11-20 DIAGNOSIS — R142 Eructation: Secondary | ICD-10-CM | POA: Insufficient documentation

## 2011-11-20 DIAGNOSIS — R143 Flatulence: Secondary | ICD-10-CM | POA: Diagnosis not present

## 2011-11-20 DIAGNOSIS — Z79899 Other long term (current) drug therapy: Secondary | ICD-10-CM | POA: Insufficient documentation

## 2011-11-20 DIAGNOSIS — R1110 Vomiting, unspecified: Secondary | ICD-10-CM

## 2011-11-20 DIAGNOSIS — Z7901 Long term (current) use of anticoagulants: Secondary | ICD-10-CM | POA: Insufficient documentation

## 2011-11-20 DIAGNOSIS — K567 Ileus, unspecified: Secondary | ICD-10-CM

## 2011-11-20 LAB — CBC
MCH: 30.4 pg (ref 26.0–34.0)
MCHC: 33.9 g/dL (ref 30.0–36.0)
MCV: 89.7 fL (ref 78.0–100.0)
Platelets: 198 10*3/uL (ref 150–400)
RDW: 14.9 % (ref 11.5–15.5)

## 2011-11-20 LAB — COMPREHENSIVE METABOLIC PANEL
AST: 31 U/L (ref 0–37)
Albumin: 4 g/dL (ref 3.5–5.2)
CO2: 24 mEq/L (ref 19–32)
Calcium: 9.4 mg/dL (ref 8.4–10.5)
Creatinine, Ser: 0.72 mg/dL (ref 0.50–1.10)
GFR calc non Af Amer: 73 mL/min — ABNORMAL LOW (ref >90)
Sodium: 139 mEq/L (ref 135–145)
Total Protein: 7.3 g/dL (ref 6.0–8.3)

## 2011-11-20 LAB — LACTIC ACID, PLASMA: Lactic Acid, Venous: 2.5 mmol/L — ABNORMAL HIGH (ref 0.5–2.2)

## 2011-11-20 LAB — PROTIME-INR
INR: 2.43 — ABNORMAL HIGH (ref 0.00–1.49)
Prothrombin Time: 26.8 seconds — ABNORMAL HIGH (ref 11.6–15.2)

## 2011-11-20 MED ORDER — IOHEXOL 300 MG/ML  SOLN
100.0000 mL | Freq: Once | INTRAMUSCULAR | Status: AC | PRN
  Administered 2011-11-20: 100 mL via INTRAVENOUS

## 2011-11-20 MED ORDER — ONDANSETRON HCL 4 MG/2ML IJ SOLN
4.0000 mg | Freq: Once | INTRAMUSCULAR | Status: AC
  Administered 2011-11-20: 4 mg via INTRAVENOUS
  Filled 2011-11-20: qty 2

## 2011-11-20 MED ORDER — HYDROMORPHONE HCL PF 1 MG/ML IJ SOLN
0.5000 mg | Freq: Once | INTRAMUSCULAR | Status: DC
  Filled 2011-11-20: qty 1

## 2011-11-20 MED ORDER — IOHEXOL 300 MG/ML  SOLN
20.0000 mL | INTRAMUSCULAR | Status: AC
  Administered 2011-11-20 (×2): 20 mL via ORAL

## 2011-11-20 MED ORDER — ONDANSETRON HCL 4 MG/2ML IJ SOLN
INTRAMUSCULAR | Status: AC
  Filled 2011-11-20: qty 2

## 2011-11-20 MED ORDER — ONDANSETRON HCL 4 MG/2ML IJ SOLN
4.0000 mg | Freq: Once | INTRAMUSCULAR | Status: AC
  Administered 2011-11-20: 4 mg via INTRAVENOUS

## 2011-11-20 MED ORDER — SODIUM CHLORIDE 0.9 % IV BOLUS (SEPSIS)
500.0000 mL | Freq: Once | INTRAVENOUS | Status: AC
  Administered 2011-11-20: 500 mL via INTRAVENOUS

## 2011-11-20 NOTE — ED Provider Notes (Signed)
4:32 PM Accepted care from Dr. Maisie Fus. Pt currently stable. Awaiting CT of abdomen.   7:15 PM: No evidence of obstruction on CT abd. Pt has had a large bm here and is feeling much better. Lactate initially elevated is now wnl after IVF, suspect mild dehydration. Pt has tolerated po solid and liquid for >1hr. She would like to go home. Abdomen remains benign. I have discussed the diagnosis/risks/treatment options with the patient and family and believe the pt to be eligible for discharge home to follow-up with pcp in 1-2 days for repeat eval. We also discussed returning to the ED immediately if new or worsening sx occur. We discussed the sx which are most concerning (e.g., worsening pain/vomiting) that necessitate immediate return. Any new prescriptions provided to the patient are listed below.  New Prescriptions   No medications on file   Clinical Impression 1. Vomiting   2. Abdominal pain   3. Ileus      Purvis Sheffield, MD 11/20/11 2004

## 2011-11-20 NOTE — ED Notes (Signed)
Patient transported to CT 

## 2011-11-20 NOTE — ED Notes (Signed)
Daughter called, states she will be back to ED shortly to see pt.

## 2011-11-20 NOTE — ED Notes (Signed)
Per EMS pt from home with c/o nausea/vomiting/right side abdominal pain since yesterday. 20G RFA IV per EMS. Zofran 4 mg IVP given in route.

## 2011-11-20 NOTE — ED Notes (Signed)
Cherylann Banas (daughter) [phone]- [phone]

## 2011-11-20 NOTE — ED Notes (Signed)
Per charge nurse, we are moving the patient to CDU to wait for her ride home.  Advised CDU RN of plan.

## 2011-11-20 NOTE — ED Provider Notes (Signed)
History     CSN: [id number]  Arrival date & time 11/20/11  1135   First MD Initiated Contact with Patient 11/20/11 1144      Chief Complaint  Patient presents with  . Emesis  . Abdominal Pain    (Consider location/radiation/quality/duration/timing/severity/associated sxs/prior treatment) HPI Comments: Hx of multiple SBOs believes to be 2/2 adhesions.  Abdominal swelling/pain today c/w prior SBOs.  Vomiting at home.  Otherwise doing well.  Patient is a 78 y.o. female presenting with abdominal pain. The history is provided by the patient.  Abdominal Pain The primary symptoms of the illness include abdominal pain (generalized), nausea and vomiting. The primary symptoms of the illness do not include fever, shortness of breath, diarrhea or dysuria. The current episode started 13 to 24 hours ago. The onset of the illness was gradual. The problem has been gradually worsening.  Additional symptoms associated with the illness include constipation (no BM since yesterday.  not passing gas).    Past Medical History  Diagnosis Date  . Diabetes mellitus   . Hypertension   . SBO (small bowel obstruction)   . Colon cancer   . Persistent atrial fibrillation     History reviewed. No pertinent past surgical history.  No family history on file.  History  Substance Use Topics  . Smoking status: Former Smoker    Quit date: 02/14/1943  . Smokeless tobacco: Not on file  . Alcohol Use: No    OB History    Grav Para Term Preterm Abortions TAB SAB Ect Mult Living                  Review of Systems  Constitutional: Negative for fever and activity change.  HENT: Negative for congestion.   Eyes: Negative for visual disturbance.  Respiratory: Negative for chest tightness and shortness of breath.   Cardiovascular: Negative for chest pain and leg swelling.  Gastrointestinal: Positive for nausea, vomiting, abdominal pain (generalized) and constipation (no BM since yesterday.  not passing gas).  Negative for diarrhea.  Genitourinary: Negative for dysuria.  Skin: Negative for rash.  Neurological: Negative for syncope.  Psychiatric/Behavioral: Negative for behavioral problems.    Allergies  Codeine; Morphine and related; and Statins  Home Medications   Current Outpatient Rx  Name Route Sig Dispense Refill  . BISACODYL 10 MG RE SUPP Rectal Place 10 mg rectally as needed. For constipation    . BISACODYL 5 MG PO TBEC Oral Take 5 mg by mouth daily.    Marland Kitchen DILTIAZEM HCL ER COATED BEADS 360 MG PO CP24 Oral Take 1 capsule (360 mg total) by mouth daily.    Marland Kitchen FERROUS SULFATE 325 (65 FE) MG PO TABS Oral Take 325 mg by mouth daily with breakfast.      . FUROSEMIDE 40 MG PO TABS Oral Take 40-80 mg by mouth 2 (two) times daily. 2 tab in the morning and 1 in the evening    . LEVOTHYROXINE SODIUM 200 MCG PO TABS Oral Take 200 mcg by mouth every morning.      Marland Kitchen PANTOPRAZOLE SODIUM 40 MG PO TBEC Oral Take 40 mg by mouth daily.      Marland Kitchen PRAVASTATIN SODIUM 20 MG PO TABS Oral Take 20 mg by mouth daily.    Marland Kitchen RAMIPRIL 10 MG PO CAPS Oral Take 10 mg by mouth every morning.      . WARFARIN SODIUM 5 MG PO TABS Oral Take 5 mg by mouth every evening. On mon, wed, and frid take  2.5mg  and take 5mg  all other days      BP 139/82  Pulse 96  Temp(Src) 97.9 F (36.6 C) (Oral)  Resp 19  SpO2 100%  Physical Exam  Constitutional: She is oriented to person, place, and time. She appears well-developed and well-nourished.  HENT:  Head: Normocephalic and atraumatic.  Eyes: Conjunctivae and EOM are normal. Pupils are equal, round, and reactive to light. No scleral icterus.  Neck: Normal range of motion. Neck supple.  Cardiovascular: Normal rate and regular rhythm.  Exam reveals no gallop and no friction rub.   No murmur heard. Pulmonary/Chest: Effort normal and breath sounds normal. No respiratory distress. She has no wheezes. She has no rales. She exhibits no tenderness.  Abdominal: Soft. She exhibits distension  (moderate). She exhibits no mass. There is tenderness (mild). There is no rebound and no guarding.  Musculoskeletal: Normal range of motion. She exhibits edema (1+ (baseline epr pt)).  Neurological: She is alert and oriented to person, place, and time. She has normal reflexes. No cranial nerve deficit.  Skin: Skin is warm and dry. No rash noted.  Psychiatric: She has a normal mood and affect. Her behavior is normal. Judgment and thought content normal.    ED Course  Procedures (including critical care time)   Date: 11/20/2011  Rate: 130  Rhythm: atrial fibrillation  QRS Axis: normal  Intervals: normal  ST/T Wave abnormalities: normal  Conduction Disutrbances:none  Narrative Interpretation:   Old EKG Reviewed: unchanged   Labs Reviewed  CBC - Abnormal; Notable for the following:    Hemoglobin 15.3 (*)    All other components within normal limits  COMPREHENSIVE METABOLIC PANEL - Abnormal; Notable for the following:    Glucose, Bld 134 (*)    GFR calc non Af Amer 73 (*)    GFR calc Af Amer 85 (*)    All other components within normal limits  LACTIC ACID, PLASMA - Abnormal; Notable for the following:    Lactic Acid, Venous 2.5 (*)    All other components within normal limits  PROTIME-INR - Abnormal; Notable for the following:    Prothrombin Time 26.8 (*)    INR 2.43 (*)    All other components within normal limits  LIPASE, BLOOD   Dg Abd Acute W/chest  11/20/2011  *RADIOLOGY REPORT*  Clinical Data: Abdominal distention  ACUTE ABDOMEN SERIES (ABDOMEN 2 VIEW & CHEST 1 VIEW)  Comparison: Abdominal radiographs 03/27/2011, chest radiograph 03/07/2011  Findings: Enlargement of cardiac silhouette with pulmonary vascular congestion. Tortuous aorta. Prominence of central pulmonary arteries appears stable. Minimal scarring medial left lower lobe. Lungs otherwise clear. No pleural effusion or pneumothorax. Diffuse osseous demineralization. Nonobstructive bowel gas pattern. No bowel  dilatation, bowel wall thickening, or free intraperitoneal air. No urinary tract calcification.  IMPRESSION: Enlargement of cardiac silhouette with pulmonary vascular congestion and question pulmonary arterial hypertension. No acute abdominal findings.  Original Report Authenticated By: Lollie Marrow, M.D.     1. Vomiting   2. Abdominal pain       MDM  Hx of multiple SBOs believes to be 2/2 adhesions.  Abdominal swelling/pain today c/w prior SBOs.  Vomiting at home. No flatulence or BM since yesterday. Otherwise doing well.  VSS.  Distended abdomen with moderate generalized ttp.  Pt feels like this is her typical SBO presentation.  Labs, AAS unconcerning.  Treated pain, nausea and gave gentle hydration.  Still not passing gas - will CT abdomen to better eval for obstruction.  Care transferred  to the CDU to await CT.  Pt comfortable at this time.       Army Chaco, MD 11/20/11 [phone]

## 2011-11-20 NOTE — ED Notes (Signed)
Patients heart rate has been in the 90's and as high as 155.  Dr. Maisie Fus  And nurse Matthew Folks  has been made aware.

## 2011-11-20 NOTE — ED Notes (Signed)
Pt able to tolerate crackers and water without vomiting. Pt wanting to go home. MD made aware.

## 2011-11-20 NOTE — ED Notes (Signed)
Patient is AOx4 and comfortable with her discharge instructions. 

## 2011-11-20 NOTE — ED Provider Notes (Signed)
I have seen and examined this patient with the resident.  I agree with the resident's note, assessment and plan except as indicated.     Nat Christen, MD 11/20/11 [phone]

## 2011-11-20 NOTE — ED Notes (Signed)
Patient transported to X-ray 

## 2011-12-05 ENCOUNTER — Inpatient Hospital Stay (HOSPITAL_COMMUNITY)
Admission: EM | Admit: 2011-12-05 | Discharge: 2011-12-10 | DRG: 388 | Disposition: A | Discharge status: Home / Self Care | Payer: Medicare Other | Attending: Internal Medicine | Admitting: Internal Medicine

## 2011-12-05 ENCOUNTER — Emergency Department (HOSPITAL_COMMUNITY): Payer: Medicare Other

## 2011-12-05 ENCOUNTER — Encounter (HOSPITAL_COMMUNITY): Payer: Self-pay | Admitting: Emergency Medicine

## 2011-12-05 DIAGNOSIS — Z87891 Personal history of nicotine dependence: Secondary | ICD-10-CM

## 2011-12-05 DIAGNOSIS — E860 Dehydration: Secondary | ICD-10-CM | POA: Diagnosis not present

## 2011-12-05 DIAGNOSIS — E119 Type 2 diabetes mellitus without complications: Secondary | ICD-10-CM | POA: Diagnosis present

## 2011-12-05 DIAGNOSIS — Z79899 Other long term (current) drug therapy: Secondary | ICD-10-CM

## 2011-12-05 DIAGNOSIS — E876 Hypokalemia: Secondary | ICD-10-CM | POA: Diagnosis not present

## 2011-12-05 DIAGNOSIS — I509 Heart failure, unspecified: Secondary | ICD-10-CM | POA: Diagnosis present

## 2011-12-05 DIAGNOSIS — R0602 Shortness of breath: Secondary | ICD-10-CM | POA: Diagnosis not present

## 2011-12-05 DIAGNOSIS — Z85038 Personal history of other malignant neoplasm of large intestine: Secondary | ICD-10-CM

## 2011-12-05 DIAGNOSIS — K56609 Unspecified intestinal obstruction, unspecified as to partial versus complete obstruction: Principal | ICD-10-CM | POA: Diagnosis present

## 2011-12-05 DIAGNOSIS — Z7901 Long term (current) use of anticoagulants: Secondary | ICD-10-CM

## 2011-12-05 DIAGNOSIS — I4891 Unspecified atrial fibrillation: Secondary | ICD-10-CM | POA: Diagnosis present

## 2011-12-05 DIAGNOSIS — I5043 Acute on chronic combined systolic (congestive) and diastolic (congestive) heart failure: Secondary | ICD-10-CM | POA: Diagnosis present

## 2011-12-05 DIAGNOSIS — I5032 Chronic diastolic (congestive) heart failure: Secondary | ICD-10-CM

## 2011-12-05 DIAGNOSIS — D72829 Elevated white blood cell count, unspecified: Secondary | ICD-10-CM | POA: Diagnosis present

## 2011-12-05 DIAGNOSIS — G4733 Obstructive sleep apnea (adult) (pediatric): Secondary | ICD-10-CM | POA: Diagnosis present

## 2011-12-05 DIAGNOSIS — Z23 Encounter for immunization: Secondary | ICD-10-CM

## 2011-12-05 DIAGNOSIS — I517 Cardiomegaly: Secondary | ICD-10-CM | POA: Diagnosis not present

## 2011-12-05 DIAGNOSIS — I10 Essential (primary) hypertension: Secondary | ICD-10-CM | POA: Diagnosis present

## 2011-12-05 DIAGNOSIS — R1110 Vomiting, unspecified: Secondary | ICD-10-CM | POA: Diagnosis not present

## 2011-12-05 DIAGNOSIS — R109 Unspecified abdominal pain: Secondary | ICD-10-CM | POA: Diagnosis not present

## 2011-12-05 DIAGNOSIS — I4821 Permanent atrial fibrillation: Secondary | ICD-10-CM | POA: Diagnosis present

## 2011-12-05 DIAGNOSIS — I5023 Acute on chronic systolic (congestive) heart failure: Secondary | ICD-10-CM | POA: Diagnosis not present

## 2011-12-05 LAB — PROTIME-INR: INR: 2.32 — ABNORMAL HIGH (ref 0.00–1.49)

## 2011-12-05 LAB — LACTIC ACID, PLASMA: Lactic Acid, Venous: 3.6 mmol/L — ABNORMAL HIGH (ref 0.5–2.2)

## 2011-12-05 LAB — DIFFERENTIAL
Basophils Absolute: 0 10*3/uL (ref 0.0–0.1)
Eosinophils Absolute: 0 10*3/uL (ref 0.0–0.7)
Eosinophils Relative: 0 % (ref 0–5)
Lymphocytes Relative: 10 % — ABNORMAL LOW (ref 12–46)
Neutrophils Relative %: 86 % — ABNORMAL HIGH (ref 43–77)

## 2011-12-05 LAB — CBC
MCV: 90.8 fL (ref 78.0–100.0)
Platelets: 202 10*3/uL (ref 150–400)
RDW: 15.2 % (ref 11.5–15.5)
WBC: 11.3 10*3/uL — ABNORMAL HIGH (ref 4.0–10.5)

## 2011-12-05 LAB — COMPREHENSIVE METABOLIC PANEL
ALT: 20 U/L (ref 0–35)
AST: 30 U/L (ref 0–37)
CO2: 27 mEq/L (ref 19–32)
Calcium: 10.3 mg/dL (ref 8.4–10.5)
Sodium: 139 mEq/L (ref 135–145)
Total Protein: 7.8 g/dL (ref 6.0–8.3)

## 2011-12-05 MED ORDER — SODIUM CHLORIDE 0.9 % IV BOLUS (SEPSIS)
500.0000 mL | Freq: Once | INTRAVENOUS | Status: AC
Start: 2011-12-05 — End: 2011-12-06
  Administered 2011-12-05: 500 mL via INTRAVENOUS

## 2011-12-05 MED ORDER — SODIUM CHLORIDE 0.9 % IV SOLN: INTRAVENOUS | Status: DC

## 2011-12-05 MED ORDER — ONDANSETRON HCL 4 MG/2ML IJ SOLN
4.0000 mg | Freq: Once | INTRAMUSCULAR | Status: AC
  Administered 2011-12-05: 4 mg via INTRAVENOUS
  Filled 2011-12-05: qty 2

## 2011-12-05 NOTE — ED Provider Notes (Addendum)
History     CSN: [id number]  Arrival date & time 12/05/11  2057   First MD Initiated Contact with Patient 12/05/11 2114      Chief Complaint  Patient presents with  . Abdominal Pain    (Consider location/radiation/quality/duration/timing/severity/associated sxs/prior treatment) Patient is a 78 y.o. female presenting with vomiting. The history is provided by the patient and a relative.  Emesis  This is a recurrent problem. The current episode started 6 to 12 hours ago. The problem occurs more than 10 times per day. The problem has been gradually worsening. The emesis has an appearance of stomach contents. There has been no fever. Pertinent negatives include no abdominal pain, no chills, no cough, no diarrhea and no fever. Associated symptoms comments: Last normal bowel movement was yesterday.    Past Medical History  Diagnosis Date  . Diabetes mellitus   . Hypertension   . SBO (small bowel obstruction)   . Colon cancer   . Persistent atrial fibrillation     History reviewed. No pertinent past surgical history.  No family history on file.  History  Substance Use Topics  . Smoking status: Former Smoker    Quit date: 02/14/1943  . Smokeless tobacco: Not on file  . Alcohol Use: No    OB History    Grav Para Term Preterm Abortions TAB SAB Ect Mult Living                  Review of Systems  Constitutional: Negative for fever and chills.  Respiratory: Negative for cough, chest tightness and shortness of breath.   Cardiovascular: Negative for chest pain and leg swelling.  Gastrointestinal: Positive for nausea, vomiting and abdominal distention. Negative for abdominal pain and diarrhea.  All other systems reviewed and are negative.    Allergies  Codeine; Morphine and related; and Statins  Home Medications   Current Outpatient Rx  Name Route Sig Dispense Refill  . BISACODYL 10 MG RE SUPP Rectal Place 10 mg rectally as needed. For constipation    . BISACODYL 5 MG PO  TBEC Oral Take 5 mg by mouth daily.    Marland Kitchen DILTIAZEM HCL ER COATED BEADS 360 MG PO CP24 Oral Take 1 capsule (360 mg total) by mouth daily.    Marland Kitchen FERROUS SULFATE 325 (65 FE) MG PO TABS Oral Take 325 mg by mouth daily with breakfast.      . FUROSEMIDE 40 MG PO TABS Oral Take 40-80 mg by mouth 2 (two) times daily. 2 tab in the morning and 1 in the evening    . LEVOTHYROXINE SODIUM 200 MCG PO TABS Oral Take 200 mcg by mouth every morning.      Marland Kitchen PRAVASTATIN SODIUM 20 MG PO TABS Oral Take 20 mg by mouth daily.    Marland Kitchen RAMIPRIL 10 MG PO CAPS Oral Take 10 mg by mouth every morning.      . WARFARIN SODIUM 5 MG PO TABS Oral Take 5 mg by mouth every evening. On mon, wed, and frid take 2.5mg  and take 5mg  all other days      BP 133/74  Pulse 55  Temp(Src) 98.1 F (36.7 C) (Oral)  Resp 20  SpO2 100%  Physical Exam  Nursing note and vitals reviewed. Constitutional: She is oriented to person, place, and time. She appears well-developed and well-nourished. No distress.  HENT:  Head: Normocephalic and atraumatic.  Mouth/Throat: Oropharynx is clear and moist.  Eyes: Conjunctivae and EOM are normal. Pupils are equal, round, and  reactive to light.  Neck: Normal range of motion. Neck supple.  Cardiovascular: Intact distal pulses.  An irregularly irregular rhythm present. Tachycardia present.   No murmur heard. Pulmonary/Chest: Effort normal and breath sounds normal. No respiratory distress. She has no wheezes. She has no rales.  Abdominal: Soft. She exhibits distension. Bowel sounds are absent. There is no tenderness. There is no rebound and no guarding.  Musculoskeletal: Normal range of motion. She exhibits no edema and no tenderness.  Neurological: She is alert and oriented to person, place, and time.  Skin: Skin is warm and dry. No rash noted. No erythema.  Psychiatric: She has a normal mood and affect. Her behavior is normal.    ED Course  Procedures (including critical care time)  Labs Reviewed  CBC  - Abnormal; Notable for the following:    WBC 11.3 (*)    RBC 5.42 (*)    Hemoglobin 16.5 (*)    HCT 49.2 (*)    All other components within normal limits  DIFFERENTIAL - Abnormal; Notable for the following:    Neutrophils Relative 86 (*)    Neutro Abs 9.7 (*)    Lymphocytes Relative 10 (*)    All other components within normal limits  COMPREHENSIVE METABOLIC PANEL - Abnormal; Notable for the following:    Glucose, Bld 140 (*)    GFR calc non Af Amer 53 (*)    GFR calc Af Amer 61 (*)    All other components within normal limits  PROTIME-INR - Abnormal; Notable for the following:    Prothrombin Time 25.9 (*)    INR 2.32 (*)    All other components within normal limits  LACTIC ACID, PLASMA - Abnormal; Notable for the following:    Lactic Acid, Venous 3.6 (*)    All other components within normal limits  URINALYSIS, ROUTINE W REFLEX MICROSCOPIC   Dg Chest Portable 1 View  12/05/2011  *RADIOLOGY REPORT*  Clinical Data: Shortness of breath.  PORTABLE CHEST - 1 VIEW  Comparison: 01/18/2011.  Findings: The heart is enlarged but stable.  The mediastinal and hilar contours are slightly prominent but unchanged.  The lungs are clear.  No pleural effusion.  Stable calcified granuloma in the right lung.  The bony structures are intact.  IMPRESSION: Stable cardiac enlargement but no acute pulmonary findings.  Original Report Authenticated By: P. Loralie Champagne, M.D.   Dg Abd Portable 1v  12/05/2011  *RADIOLOGY REPORT*  Clinical Data: The abdominal pain.  PORTABLE ABDOMEN - 1 VIEW  Comparison: CT scan 11/20/2011.  Findings: There is scattered air and stool in the right colon.  A mildly distended mid small bowel loop is noted.  Possible early or partial small bowel obstruction or sentinel loop from a local inflammatory process.  No free air.  IMPRESSION: Mildly dilated mid small bowel loop could be due to an early or partial small bowel obstruction or local inflammatory process.  Original Report  Authenticated By: P. Loralie Champagne, M.D.     Date: 12/05/2011  Rate: 157  Rhythm: atrial fibrillation RVR  QRS Axis: normal  Intervals: normal  ST/T Wave abnormalities: atrial fibrillation  Conduction Disutrbances:none  Narrative Interpretation:   Old EKG Reviewed: unchanged   1. Bowel obstruction   2. Atrial fibrillation       MDM   Patient with recurrent bowel obstruction do to adhesions who is here on May 21 and had a CT scan that showed slowing of the contrast which is more suggestive of ileus  and she had a large bowel movement while in the department and was discharged home. However her symptoms started again today she has no bowel sounds on exam the vital signs are within normal limits. She does appear mildly dehydrated. She has no localized abdominal tenderness on exam. NG tube placed. Labs ordered and IV fluids given.  Pt in afib RVR but may be due to dehydration will eval after bolus to see if pt needs cardizem.   11:24 PM Lactate elevated at 3.6 and plain film showing dilated loops of bowel suggestive of early or partial bowel obstruction. NG tube placed and 600 cc out and patient feels much better. Will admit for further care.  Patient has been intermittently in and out of a fair bit and will give a dose of Cardizem for rate control.    Gwyneth Sprout, MD 12/05/11 1610  Gwyneth Sprout, MD 12/05/11 9604  Gwyneth Sprout, MD 12/05/11 5409

## 2011-12-05 NOTE — ED Notes (Signed)
PT. REPORTS MID ABDOMINAL PAIN WITH VOMITTING ONSET TODAY , PT. STATES HISTORY OF BOWEL OBSTRUCTION . LAST BM YESTERDAY.

## 2011-12-05 NOTE — ED Notes (Signed)
An Individual called and identified herself as the pt's daughter and requested information regarding her condition. I explained that I was unable to give her information other than she was stable and would be admitted. She continued to ask questions repeatedly including medical procedures performed and VS. Again I stated that I was very busy with several pts and when I had a chance I would obtain permission from pt to give her information. This individual proceeded to tell me how she was a Charity fundraiser here and that she has called before and gotten information regarding her mother. I then explained that it would be a HIPPA violation for me to give information and was told that I was performing poor customer service. I explained that if I gave information without pt permission that would be poor customer service. After completing the call I Informed pt of the call and showed pt how to use phone so that she could call her daughter

## 2011-12-05 NOTE — ED Notes (Signed)
Jonathon Resides (daughter) [phone]

## 2011-12-06 ENCOUNTER — Encounter (HOSPITAL_COMMUNITY): Payer: Self-pay | Admitting: Internal Medicine

## 2011-12-06 DIAGNOSIS — I517 Cardiomegaly: Secondary | ICD-10-CM | POA: Diagnosis not present

## 2011-12-06 DIAGNOSIS — Z79899 Other long term (current) drug therapy: Secondary | ICD-10-CM | POA: Diagnosis not present

## 2011-12-06 DIAGNOSIS — I5023 Acute on chronic systolic (congestive) heart failure: Secondary | ICD-10-CM | POA: Diagnosis not present

## 2011-12-06 DIAGNOSIS — I4891 Unspecified atrial fibrillation: Secondary | ICD-10-CM | POA: Diagnosis present

## 2011-12-06 DIAGNOSIS — Z5189 Encounter for other specified aftercare: Secondary | ICD-10-CM | POA: Diagnosis not present

## 2011-12-06 DIAGNOSIS — E1165 Type 2 diabetes mellitus with hyperglycemia: Secondary | ICD-10-CM

## 2011-12-06 DIAGNOSIS — E860 Dehydration: Secondary | ICD-10-CM | POA: Diagnosis present

## 2011-12-06 DIAGNOSIS — E119 Type 2 diabetes mellitus without complications: Secondary | ICD-10-CM | POA: Diagnosis present

## 2011-12-06 DIAGNOSIS — Z85038 Personal history of other malignant neoplasm of large intestine: Secondary | ICD-10-CM | POA: Diagnosis not present

## 2011-12-06 DIAGNOSIS — E118 Type 2 diabetes mellitus with unspecified complications: Secondary | ICD-10-CM | POA: Diagnosis not present

## 2011-12-06 DIAGNOSIS — D72829 Elevated white blood cell count, unspecified: Secondary | ICD-10-CM | POA: Diagnosis present

## 2011-12-06 DIAGNOSIS — Z09 Encounter for follow-up examination after completed treatment for conditions other than malignant neoplasm: Secondary | ICD-10-CM | POA: Diagnosis not present

## 2011-12-06 DIAGNOSIS — Z7901 Long term (current) use of anticoagulants: Secondary | ICD-10-CM | POA: Diagnosis not present

## 2011-12-06 DIAGNOSIS — R1110 Vomiting, unspecified: Secondary | ICD-10-CM | POA: Diagnosis not present

## 2011-12-06 DIAGNOSIS — Z23 Encounter for immunization: Secondary | ICD-10-CM | POA: Diagnosis not present

## 2011-12-06 DIAGNOSIS — R0602 Shortness of breath: Secondary | ICD-10-CM | POA: Diagnosis not present

## 2011-12-06 DIAGNOSIS — K56609 Unspecified intestinal obstruction, unspecified as to partial versus complete obstruction: Secondary | ICD-10-CM | POA: Diagnosis not present

## 2011-12-06 DIAGNOSIS — E876 Hypokalemia: Secondary | ICD-10-CM | POA: Diagnosis not present

## 2011-12-06 DIAGNOSIS — G4733 Obstructive sleep apnea (adult) (pediatric): Secondary | ICD-10-CM | POA: Diagnosis present

## 2011-12-06 DIAGNOSIS — Z87891 Personal history of nicotine dependence: Secondary | ICD-10-CM | POA: Diagnosis not present

## 2011-12-06 DIAGNOSIS — I509 Heart failure, unspecified: Secondary | ICD-10-CM | POA: Diagnosis present

## 2011-12-06 DIAGNOSIS — I10 Essential (primary) hypertension: Secondary | ICD-10-CM | POA: Diagnosis not present

## 2011-12-06 DIAGNOSIS — R109 Unspecified abdominal pain: Secondary | ICD-10-CM | POA: Diagnosis not present

## 2011-12-06 DIAGNOSIS — I5032 Chronic diastolic (congestive) heart failure: Secondary | ICD-10-CM | POA: Diagnosis not present

## 2011-12-06 LAB — GLUCOSE, CAPILLARY
Glucose-Capillary: 96 mg/dL (ref 70–99)
Glucose-Capillary: 105 mg/dL — ABNORMAL HIGH (ref 70–99)

## 2011-12-06 LAB — COMPREHENSIVE METABOLIC PANEL
BUN: 18 mg/dL (ref 6–23)
CO2: 23 mEq/L (ref 19–32)
Chloride: 100 mEq/L (ref 96–112)
Creatinine, Ser: 0.87 mg/dL (ref 0.50–1.10)
GFR calc Af Amer: 66 mL/min — ABNORMAL LOW (ref >90)
GFR calc non Af Amer: 57 mL/min — ABNORMAL LOW (ref >90)
Glucose, Bld: 131 mg/dL — ABNORMAL HIGH (ref 70–99)
Total Bilirubin: 0.7 mg/dL (ref 0.3–1.2)

## 2011-12-06 LAB — CARDIAC PANEL(CRET KIN+CKTOT+MB+TROPI)
Relative Index: INVALID (ref 0.0–2.5)
Total CK: 94 U/L (ref 7–177)
Total CK: 95 U/L (ref 7–177)
Troponin I: <0.3 ng/mL (ref <0.30)

## 2011-12-06 LAB — URINE MICROSCOPIC-ADD ON

## 2011-12-06 LAB — URINALYSIS, ROUTINE W REFLEX MICROSCOPIC
Hgb urine dipstick: NEGATIVE
Specific Gravity, Urine: 1.02 (ref 1.005–1.030)
pH: 5.5 (ref 5.0–8.0)

## 2011-12-06 LAB — CBC
MCH: 31 pg (ref 26.0–34.0)
Platelets: 193 10*3/uL (ref 150–400)
RBC: 5.1 MIL/uL (ref 3.87–5.11)
WBC: 9.7 10*3/uL (ref 4.0–10.5)

## 2011-12-06 LAB — TSH: TSH: 6.365 u[IU]/mL — ABNORMAL HIGH (ref 0.350–4.500)

## 2011-12-06 MED ORDER — PNEUMOCOCCAL VAC POLYVALENT 25 MCG/0.5ML IJ INJ
0.5000 mL | INJECTION | INTRAMUSCULAR | Status: AC
  Administered 2011-12-07: 0.5 mL via INTRAMUSCULAR
  Filled 2011-12-06: qty 0.5

## 2011-12-06 MED ORDER — SODIUM CHLORIDE 0.9 % IJ SOLN
3.0000 mL | Freq: Two times a day (BID) | INTRAMUSCULAR | Status: DC
  Administered 2011-12-08 – 2011-12-10 (×4): 3 mL via INTRAVENOUS

## 2011-12-06 MED ORDER — METRONIDAZOLE IN NACL 5-0.79 MG/ML-% IV SOLN
500.0000 mg | Freq: Three times a day (TID) | INTRAVENOUS | Status: DC
  Administered 2011-12-06 – 2011-12-09 (×10): 500 mg via INTRAVENOUS
  Filled 2011-12-06 (×12): qty 100

## 2011-12-06 MED ORDER — INSULIN ASPART 100 UNIT/ML ~~LOC~~ SOLN
0.0000 [IU] | SUBCUTANEOUS | Status: DC
  Administered 2011-12-08: 1 [IU] via SUBCUTANEOUS

## 2011-12-06 MED ORDER — METOPROLOL TARTRATE 1 MG/ML IV SOLN
5.0000 mg | Freq: Four times a day (QID) | INTRAVENOUS | Status: DC
  Administered 2011-12-06 – 2011-12-08 (×6): 5 mg via INTRAVENOUS
  Filled 2011-12-06 (×11): qty 5

## 2011-12-06 MED ORDER — SODIUM CHLORIDE 0.9 % IV SOLN
INTRAVENOUS | Status: DC
  Administered 2011-12-06 – 2011-12-08 (×5): via INTRAVENOUS

## 2011-12-06 MED ORDER — SODIUM CHLORIDE 0.9 % IV SOLN: INTRAVENOUS | Status: AC

## 2011-12-06 MED ORDER — ONDANSETRON HCL 4 MG/2ML IJ SOLN: 4.0000 mg | Freq: Four times a day (QID) | INTRAMUSCULAR | Status: DC | PRN

## 2011-12-06 MED ORDER — DILTIAZEM HCL 25 MG/5ML IV SOLN
20.0000 mg | Freq: Four times a day (QID) | INTRAVENOUS | Status: DC | PRN
  Administered 2011-12-06: 20 mg via INTRAVENOUS
  Filled 2011-12-06 (×2): qty 5

## 2011-12-06 MED ORDER — METOPROLOL TARTRATE 1 MG/ML IV SOLN
5.0000 mg | Freq: Once | INTRAVENOUS | Status: AC
  Administered 2011-12-06: 5 mg via INTRAVENOUS
  Filled 2011-12-06: qty 5

## 2011-12-06 MED ORDER — CIPROFLOXACIN IN D5W 400 MG/200ML IV SOLN
400.0000 mg | Freq: Two times a day (BID) | INTRAVENOUS | Status: DC
  Administered 2011-12-06 – 2011-12-09 (×7): 400 mg via INTRAVENOUS
  Filled 2011-12-06 (×8): qty 200

## 2011-12-06 MED ORDER — ONDANSETRON HCL 4 MG PO TABS: 4.0000 mg | ORAL_TABLET | Freq: Four times a day (QID) | ORAL | Status: DC | PRN

## 2011-12-06 NOTE — ED Notes (Signed)
Report given to Darrel RN on 2000

## 2011-12-06 NOTE — Progress Notes (Signed)
Subjective:   Chart reviewed. Patient indicates that this is her 45-13 th episode of small bowel obstruction since 2009. Abdominal distention and pain have significantly improved since admission but abdominal distention has still not resolved. No BM or flatus for 2 days. Declines surgery. No chest pain, dyspnea or palpitations.  Objective  Vital signs in last 24 hours: Filed Vitals:   12/06/11 0225 12/06/11 0417 12/06/11 0529 12/06/11 0647  BP:  120/82  120/82  Pulse: 90 90 87 87  Temp: 97.4 F (36.3 C) 97.4 F (36.3 C) 98.6 F (37 C) 98.6 F (37 C)  TempSrc: Oral Oral Oral Oral  Resp: 18 18 16 18   Weight: 98.93 kg (218 lb 1.6 oz) 98.93 kg (218 lb 1.6 oz)  98.93 kg (218 lb 1.6 oz)  SpO2: 91% 91% 93% 93%   Weight change:   Intake/Output Summary (Last 24 hours) at 12/06/11 1248 Last data filed at 12/06/11 0749  Gross per 24 hour  Intake      0 ml  Output   2900 ml  Net  -2900 ml    Physical Exam:  General Exam: Comfortable. Sitting on a reclining chair. Pleasant and looks younger than stated age. Respiratory System: Occasional basal crackles but otherwise clear to auscultation. No increased work of breathing.  Cardiovascular System: First and second heart sounds heard. Irregular and mildly tachycardic. No JVD/murmurs. 1+ bilateral lower extremity pitting edema. Telemetry shows atrial fibrillation with rapid ventricular rate in the 120s.  Gastrointestinal System: Abdomen is mildly distended, soft and normal bowel sounds heard. Nontender. NG tube is draining dark bilious material. Normal bowel sounds heard. Central Nervous System: Alert and oriented. No focal neurological deficits. Extremities: Symmetric 5 x 5 power.  Labs:  Basic Metabolic Panel:  Lab 12/06/11 1610 12/05/11 2116  NA 139 139  K 4.1 4.2  CL 100 97  CO2 23 27  GLUCOSE 131* 140*  BUN 18 17  CREATININE 0.87 0.93  CALCIUM 9.9 10.3  ALB -- --  PHOS -- --   Liver Function Tests:  Lab 12/06/11 0345  12/05/11 2116  AST 30 30  ALT 20 20  ALKPHOS 74 81  BILITOT 0.7 0.6  PROT 7.1 7.8  ALBUMIN 3.8 4.3   No results found for this basename: LIPASE:3,AMYLASE:3 in the last 168 hours No results found for this basename: AMMONIA:3 in the last 168 hours CBC:  Lab 12/06/11 0345 12/05/11 2116  WBC 9.7 11.3*  NEUTROABS -- 9.7*  HGB 15.8* 16.5*  HCT 46.3* 49.2*  MCV 90.8 90.8  PLT 193 202   Cardiac Enzymes:  Lab 12/06/11 1055 12/06/11 0345  CKTOTAL 95 94  CKMB 3.8 3.8  CKMBINDEX -- --  TROPONINI <0.30 <0.30   CBG:  Lab 12/06/11 1127  GLUCAP 96    Iron Studies: No results found for this basename: IRON,TIBC,TRANSFERRIN,FERRITIN in the last 72 hours Studies/Results: Dg Chest Portable 1 View  12/05/2011  *RADIOLOGY REPORT*  Clinical Data: Shortness of breath.  PORTABLE CHEST - 1 VIEW  Comparison: 01/18/2011.  Findings: The heart is enlarged but stable.  The mediastinal and hilar contours are slightly prominent but unchanged.  The lungs are clear.  No pleural effusion.  Stable calcified granuloma in the right lung.  The bony structures are intact.  IMPRESSION: Stable cardiac enlargement but no acute pulmonary findings.  Original Report Authenticated By: P. Loralie Champagne, M.D.   Dg Abd Portable 1v  12/05/2011  *RADIOLOGY REPORT*  Clinical Data: The abdominal pain.  PORTABLE ABDOMEN -  1 VIEW  Comparison: CT scan 11/20/2011.  Findings: There is scattered air and stool in the right colon.  A mildly distended mid small bowel loop is noted.  Possible early or partial small bowel obstruction or sentinel loop from a local inflammatory process.  No free air.  IMPRESSION: Mildly dilated mid small bowel loop could be due to an early or partial small bowel obstruction or local inflammatory process.  Original Report Authenticated By: P. Loralie Champagne, M.D.   Medications:    . sodium chloride 150 mL/hr at 12/06/11 0244  . DISCONTD: sodium chloride        . sodium chloride   Intravenous STAT  .  ciprofloxacin  400 mg Intravenous Q12H  . insulin aspart  0-9 Units Subcutaneous Q4H  . metoprolol  5 mg Intravenous Once  . metoprolol  5 mg Intravenous Q6H  . metronidazole  500 mg Intravenous Q8H  . ondansetron  4 mg Intravenous Once  . pneumococcal 23 valent vaccine  0.5 mL Intramuscular Tomorrow-1000  . sodium chloride  500 mL Intravenous Once  . sodium chloride  3 mL Intravenous Q12H    I  have reviewed scheduled and prn medications.     Problem/Plan: Principal Problem:  *SBO (small bowel obstruction) Active Problems:  Chronic diastolic heart failure  Atrial fibrillation with RVR  Dehydration  Leucocytosis  1. Recurrent partial small bowel obstruction: Likely secondary to adhesions versus? Inflammatory process: Continue to rest the bowels, n.p.o. and NG tube to intermittent wall suction. Follow serial KUBs daily. Ambulate as tolerated. Gentle IV fluids given history of congestive heart failure. Continue empiric IV Cipro and Flagyl for now. 2. Persistent Atrial fibrillation with rapid ventricular rate and anticoagulated: Discussed with her primary cardiologist and agree on scheduled IV metoprolol and okay to have slightly fast heart rates in the 100s to 110s. Hold Coumadin until able to take by mouth. 3. Type 2 diabetes mellitus: Sliding scale insulin and monitor CBGs. 4. Hypertension: Reasonably controlled. Scheduled IV metoprolol. 5. Chronic diastolic congestive heart failure: Currently clinically compensated. Monitor closely. Diuretics currently on hold.   [name] 12/06/2011,12:48 PM  LOS: 1 day

## 2011-12-06 NOTE — Care Management Note (Unsigned)
    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     Action/Plan:   DISCHARGE PLANNING DISCUSSED- NO NEEDS IDENTIFIED AT THIS TIME.   Anticipated DC Date:  12/07/2011   Anticipated DC Plan:  HOME/SELF CARE      DC Planning Services  CM consult      Choice offered to / List presented to:             Status of service:  In process, will continue to follow Medicare Important Message given?   (If response is "NO", the following Medicare IM given date fields will be blank) Date Medicare IM given:   Date Additional Medicare IM given:    Discharge Disposition:    Per UR Regulation:  Reviewed for med. necessity/level of care/duration of stay  If discussed at Long Length of Stay Meetings, dates discussed:    Comments:  12/06/11  1224  Genaro Bekker SIMMONS RN, BSN [phone] NCM WILL FOLLOW.

## 2011-12-06 NOTE — H&P (Signed)
TANESIA BUTNER is an 78 y.o. female.   Chief Complaint: Abdominal pain HPI: A 78 year old pleasant woman with history of recurrent small bowel obstructions after abdominal surgery many years ago for colorectal cancer. Patient presented to the ER with increasing abdominal pain and distention this afternoon. Symptoms started with nausea and projectile vomiting. She has not vomited since then but has continued to have nausea and abdominal pain. Has not had a bowel movement all day long. She also noted that she was having some palpitations but no chest pain. She has slightly increased shortness of breath over her baseline but for the most part she is fine. She uses CPAP at home for obstructive sleep apnea otherwise she has not noticed any major difference in how breathing. Patient was found to have partial small bowel obstruction as well as echo fibrillation with rapid ventricular response hence she is being admitted for further management.  Past Medical History  Diagnosis Date  . Diabetes mellitus   . Hypertension   . SBO (small bowel obstruction)   . Colon cancer   . Persistent atrial fibrillation     History reviewed. No pertinent past surgical history.  No family history on file. Social History:  reports that she quit smoking about 68 years ago. She does not have any smokeless tobacco history on file. She reports that she does not drink alcohol or use illicit drugs.  Allergies:  Allergies  Allergen Reactions  . Codeine     unknown  . Morphine And Related     sick  . Statins     sick    Medications Prior to Admission  Medication Sig Dispense Refill  . bisacodyl (DULCOLAX) 10 MG suppository Place 10 mg rectally as needed. For constipation      . bisacodyl (DULCOLAX) 5 MG EC tablet Take 5 mg by mouth daily.      Marland Kitchen diltiazem (CARDIZEM CD) 360 MG 24 hr capsule Take 1 capsule (360 mg total) by mouth daily.      . ferrous sulfate 325 (65 FE) MG tablet Take 325 mg by mouth daily with  breakfast.        . furosemide (LASIX) 40 MG tablet Take 40-80 mg by mouth 2 (two) times daily. 2 tab in the morning and 1 in the evening      . levothyroxine (SYNTHROID, LEVOTHROID) 200 MCG tablet Take 200 mcg by mouth every morning.        . pravastatin (PRAVACHOL) 20 MG tablet Take 20 mg by mouth daily.      . ramipril (ALTACE) 10 MG capsule Take 10 mg by mouth every morning.        . warfarin (COUMADIN) 5 MG tablet Take 5 mg by mouth every evening. On mon, wed, and frid take 2.5mg  and take 5mg  all other days        Results for orders placed during the hospital encounter of 12/05/11 (from the past 48 hour(s))  CBC     Status: Abnormal   Collection Time   12/05/11  9:16 PM      Component Value Range Comment   WBC 11.3 (*) 4.0 - 10.5 (K/uL)    RBC 5.42 (*) 3.87 - 5.11 (MIL/uL)    Hemoglobin 16.5 (*) 12.0 - 15.0 (g/dL)    HCT 16.1 (*) 09.6 - 46.0 (%)    MCV 90.8  78.0 - 100.0 (fL)    MCH 30.4  26.0 - 34.0 (pg)    MCHC 33.5  30.0 -  36.0 (g/dL)    RDW 04.5  40.9 - 81.1 (%)    Platelets 202  150 - 400 (K/uL)   DIFFERENTIAL     Status: Abnormal   Collection Time   12/05/11  9:16 PM      Component Value Range Comment   Neutrophils Relative 86 (*) 43 - 77 (%)    Neutro Abs 9.7 (*) 1.7 - 7.7 (K/uL)    Lymphocytes Relative 10 (*) 12 - 46 (%)    Lymphs Abs 1.1  0.7 - 4.0 (K/uL)    Monocytes Relative 3  3 - 12 (%)    Monocytes Absolute 0.4  0.1 - 1.0 (K/uL)    Eosinophils Relative 0  0 - 5 (%)    Eosinophils Absolute 0.0  0.0 - 0.7 (K/uL)    Basophils Relative 0  0 - 1 (%)    Basophils Absolute 0.0  0.0 - 0.1 (K/uL)   COMPREHENSIVE METABOLIC PANEL     Status: Abnormal   Collection Time   12/05/11  9:16 PM      Component Value Range Comment   Sodium 139  135 - 145 (mEq/L)    Potassium 4.2  3.5 - 5.1 (mEq/L)    Chloride 97  96 - 112 (mEq/L)    CO2 27  19 - 32 (mEq/L)    Glucose, Bld 140 (*) 70 - 99 (mg/dL)    BUN 17  6 - 23 (mg/dL)    Creatinine, Ser 9.14  0.50 - 1.10 (mg/dL)    Calcium  78.2  8.4 - 10.5 (mg/dL)    Total Protein 7.8  6.0 - 8.3 (g/dL)    Albumin 4.3  3.5 - 5.2 (g/dL)    AST 30  0 - 37 (U/L)    ALT 20  0 - 35 (U/L)    Alkaline Phosphatase 81  39 - 117 (U/L)    Total Bilirubin 0.6  0.3 - 1.2 (mg/dL)    GFR calc non Af Amer 53 (*) >90 (mL/min)    GFR calc Af Amer 61 (*) >90 (mL/min)   PROTIME-INR     Status: Abnormal   Collection Time   12/05/11  9:28 PM      Component Value Range Comment   Prothrombin Time 25.9 (*) 11.6 - 15.2 (seconds)    INR 2.32 (*) 0.00 - 1.49    LACTIC ACID, PLASMA     Status: Abnormal   Collection Time   12/05/11  9:39 PM      Component Value Range Comment   Lactic Acid, Venous 3.6 (*) 0.5 - 2.2 (mmol/L)    Dg Chest Portable 1 View  12/05/2011  *RADIOLOGY REPORT*  Clinical Data: Shortness of breath.  PORTABLE CHEST - 1 VIEW  Comparison: 01/18/2011.  Findings: The heart is enlarged but stable.  The mediastinal and hilar contours are slightly prominent but unchanged.  The lungs are clear.  No pleural effusion.  Stable calcified granuloma in the right lung.  The bony structures are intact.  IMPRESSION: Stable cardiac enlargement but no acute pulmonary findings.  Original Report Authenticated By: P. Loralie Champagne, M.D.   Dg Abd Portable 1v  12/05/2011  *RADIOLOGY REPORT*  Clinical Data: The abdominal pain.  PORTABLE ABDOMEN - 1 VIEW  Comparison: CT scan 11/20/2011.  Findings: There is scattered air and stool in the right colon.  A mildly distended mid small bowel loop is noted.  Possible early or partial small bowel obstruction or sentinel loop from a local inflammatory  process.  No free air.  IMPRESSION: Mildly dilated mid small bowel loop could be due to an early or partial small bowel obstruction or local inflammatory process.  Original Report Authenticated By: P. Loralie Champagne, M.D.    Review of Systems  Constitutional: Negative.   Respiratory: Positive for shortness of breath.   Gastrointestinal: Positive for nausea, vomiting and  abdominal pain.  All other systems reviewed and are negative.    Blood pressure 140/72, pulse 117, temperature 98.1 F (36.7 C), temperature source Oral, resp. rate 23, SpO2 93.00%. Physical Exam  Constitutional: She is oriented to person, place, and time. She appears well-developed and well-nourished.  HENT:  Head: Normocephalic and atraumatic.  Right Ear: External ear normal.  Left Ear: External ear normal.  Nose: Nose normal.  Mouth/Throat: Oropharynx is clear and moist.  Eyes: Conjunctivae and EOM are normal. Pupils are equal, round, and reactive to light.  Neck: Normal range of motion. Neck supple.  Cardiovascular: Intact distal pulses.  An irregularly irregular rhythm present. Tachycardia present.  Exam reveals no friction rub.   No murmur heard. Respiratory: Effort normal and breath sounds normal.  GI: She exhibits distension. She exhibits no mass. Bowel sounds are increased. There is no tenderness. There is no rebound and no guarding.  Musculoskeletal: Normal range of motion.  Neurological: She is alert and oriented to person, place, and time. She has normal reflexes.  Skin: Skin is warm and dry.  Psychiatric: She has a normal mood and affect. Her behavior is normal. Judgment and thought content normal.     Assessment/Plan A 78 year old pleasant woman we'll small bowel obstruction. Initial NG tube insertion used it was 600 cc of fluid patient is feeling better now. She is medically and she does not want any surgery if it comes to that so she only wants conservative measures. Most likely mechanical obstruction. Plan #1 small bowel obstruction: Patient will be admitted for conservative measures. Lid NG tube in place with the suction. IV fluids pain control. Empiric antibiotics as needed. She had a CT scan done this past May visit 2 weeks ago so we will not repeat it. Will follow serial KUBs until resolution of her small bowel obstruction. #2 chronic diastolic heart failure: She  seems to have baseline. #3 atrial fibrillation with rapid ventricular response: We will use Cardizem boluses of possible to control heart rate if not we will use GTT. #4 dehydration: From her small bowel obstruction and third spacing. We will hydrate her aggressively #5 DVT prophylaxis: We will use SCD.  Briauna Gilmartin,LAWAL 12/06/2011, 2:05 AM

## 2011-12-07 ENCOUNTER — Inpatient Hospital Stay (HOSPITAL_COMMUNITY): Payer: Medicare Other

## 2011-12-07 DIAGNOSIS — K56609 Unspecified intestinal obstruction, unspecified as to partial versus complete obstruction: Secondary | ICD-10-CM

## 2011-12-07 DIAGNOSIS — E118 Type 2 diabetes mellitus with unspecified complications: Secondary | ICD-10-CM

## 2011-12-07 DIAGNOSIS — E1165 Type 2 diabetes mellitus with hyperglycemia: Secondary | ICD-10-CM

## 2011-12-07 DIAGNOSIS — I10 Essential (primary) hypertension: Secondary | ICD-10-CM

## 2011-12-07 DIAGNOSIS — I4891 Unspecified atrial fibrillation: Secondary | ICD-10-CM

## 2011-12-07 LAB — CBC
HCT: 44 % (ref 36.0–46.0)
Platelets: 177 10*3/uL (ref 150–400)
RDW: 15.4 % (ref 11.5–15.5)
WBC: 7.4 10*3/uL (ref 4.0–10.5)

## 2011-12-07 LAB — BASIC METABOLIC PANEL
BUN: 18 mg/dL (ref 6–23)
Chloride: 108 mEq/L (ref 96–112)
GFR calc Af Amer: 62 mL/min — ABNORMAL LOW (ref >90)
Potassium: 4.1 mEq/L (ref 3.5–5.1)

## 2011-12-07 LAB — GLUCOSE, CAPILLARY
Glucose-Capillary: 85 mg/dL (ref 70–99)
Glucose-Capillary: 79 mg/dL (ref 70–99)

## 2011-12-07 LAB — PROTIME-INR: Prothrombin Time: 26.5 seconds — ABNORMAL HIGH (ref 11.6–15.2)

## 2011-12-07 MED ORDER — DILTIAZEM HCL 100 MG IV SOLR
5.0000 mg/h | INTRAVENOUS | Status: DC
  Administered 2011-12-07 – 2011-12-08 (×2): 5 mg/h via INTRAVENOUS
  Filled 2011-12-07 (×2): qty 100

## 2011-12-07 NOTE — Progress Notes (Signed)
Subjective:   2 small flatus this morning but no BM. Abdomen still mildly distended but not painful.? Occasional dyspnea. No cough or chest pain. No palpitations.  Objective  Vital signs in last 24 hours: Filed Vitals:   12/06/11 1839 12/06/11 2011 12/07/11 0409 12/07/11 1154  BP:  116/76  92/58  Pulse:  114 94   Temp:  98 F (36.7 C) 97.7 F (36.5 C)   TempSrc:  Oral Oral   Resp:  18 18   Height: 5\' 6"  (1.676 m)     Weight:      SpO2:  92% 92%    Weight change:   Intake/Output Summary (Last 24 hours) at 12/07/11 1613 Last data filed at 12/07/11 1300  Gross per 24 hour  Intake 2827.5 ml  Output    375 ml  Net 2452.5 ml    Physical Exam:  General Exam: Comfortable. Sitting on a reclining chair. Pleasant and looks younger than stated age. Respiratory System: Occasional basal crackles but otherwise clear to auscultation. No increased work of breathing.  Cardiovascular System: First and second heart sounds heard. Irregular and mildly tachycardic. No JVD/murmurs. 1+ bilateral lower extremity pitting edema. Telemetry shows atrial fibrillation with rapid ventricular rate in the 120s.  Gastrointestinal System: Abdomen is mildly distended, soft and normal bowel sounds heard. Nontender. NG tube is draining dark bilious material. Normal bowel sounds heard. Central Nervous System: Alert and oriented. No focal neurological deficits. Extremities: Symmetric 5 x 5 power.  Labs:  Basic Metabolic Panel:  Lab 12/07/11 1937 12/06/11 0345 12/05/11 2116  NA 143 139 139  K 4.1 4.1 4.2  CL 108 100 97  CO2 23 23 27   GLUCOSE 102* 131* 140*  BUN 18 18 17   CREATININE 0.92 0.87 0.93  CALCIUM 8.6 9.9 10.3  ALB -- -- --  PHOS -- -- --   Liver Function Tests:  Lab 12/06/11 0345 12/05/11 2116  AST 30 30  ALT 20 20  ALKPHOS 74 81  BILITOT 0.7 0.6  PROT 7.1 7.8  ALBUMIN 3.8 4.3   No results found for this basename: LIPASE:3,AMYLASE:3 in the last 168 hours No results found for this  basename: AMMONIA:3 in the last 168 hours CBC:  Lab 12/07/11 0630 12/06/11 0345 12/05/11 2116  WBC 7.4 9.7 11.3*  NEUTROABS -- -- 9.7*  HGB 14.4 15.8* 16.5*  HCT 44.0 46.3* 49.2*  MCV 92.1 90.8 90.8  PLT 177 193 202   Cardiac Enzymes:  Lab 12/06/11 1055 12/06/11 0345  CKTOTAL 95 94  CKMB 3.8 3.8  CKMBINDEX -- --  TROPONINI <0.30 <0.30   CBG:  Lab 12/07/11 1140 12/07/11 0806 12/07/11 0412 12/07/11 0004 12/06/11 2003  GLUCAP 85 104* 112* 102* 105*   INR: 2.39   Iron Studies: No results found for this basename: IRON,TIBC,TRANSFERRIN,FERRITIN in the last 72 hours Studies/Results: Dg Chest Portable 1 View  12/05/2011  *RADIOLOGY REPORT*  Clinical Data: Shortness of breath.  PORTABLE CHEST - 1 VIEW  Comparison: 01/18/2011.  Findings: The heart is enlarged but stable.  The mediastinal and hilar contours are slightly prominent but unchanged.  The lungs are clear.  No pleural effusion.  Stable calcified granuloma in the right lung.  The bony structures are intact.  IMPRESSION: Stable cardiac enlargement but no acute pulmonary findings.  Original Report Authenticated By: P. Loralie Champagne, M.D.   Dg Abd Portable 1v  12/05/2011  *RADIOLOGY REPORT*  Clinical Data: The abdominal pain.  PORTABLE ABDOMEN - 1 VIEW  Comparison: CT  scan 11/20/2011.  Findings: There is scattered air and stool in the right colon.  A mildly distended mid small bowel loop is noted.  Possible early or partial small bowel obstruction or sentinel loop from a local inflammatory process.  No free air.  IMPRESSION: Mildly dilated mid small bowel loop could be due to an early or partial small bowel obstruction or local inflammatory process.  Original Report Authenticated By: P. Loralie Champagne, M.D.   KUB 12/08/11  IMPRESSION:  1. NG tube in the stomach.  2. Improved bowel gas pattern   Medications:    . sodium chloride 100 mL/hr at 12/07/11 1227  . diltiazem (CARDIZEM) infusion 5 mg/hr (12/07/11 1226)      . sodium  chloride   Intravenous STAT  . ciprofloxacin  400 mg Intravenous Q12H  . insulin aspart  0-9 Units Subcutaneous Q4H  . metoprolol  5 mg Intravenous Q6H  . metronidazole  500 mg Intravenous Q8H  . pneumococcal 23 valent vaccine  0.5 mL Intramuscular Tomorrow-1000  . sodium chloride  3 mL Intravenous Q12H    I  have reviewed scheduled and prn medications.     Problem/Plan: Principal Problem:  *SBO (small bowel obstruction) Active Problems:  Chronic diastolic heart failure  Atrial fibrillation with RVR  Dehydration  Leucocytosis  1. Recurrent partial small bowel obstruction: Likely secondary to adhesions versus? Inflammatory process (per CT on 11/20/11): Continue to rest the bowels, n.p.o. and NG tube to intermittent wall suction. Discussed with surgery curbside-trial of tapwater or soapsuds enema. Follow serial KUBs daily. Ambulate as tolerated. Gentle IV fluids given history of congestive heart failure. Continue empiric IV Cipro and Flagyl for now. 2. Persistent Atrial fibrillation with rapid ventricular rate and anticoagulated: Discussed with her primary cardiologist on 6/6. Continue scheduled IV metoprolol and add Cardizem infusion at 5 mg per hour. Okay to have slightly fast heart rates in the 100s to 110s. Hold Coumadin until able to take by mouth. 3. Type 2 diabetes mellitus: Sliding scale insulin and monitor CBGs. Controlled. 4. Hypertension: Reasonably controlled. Monitor while on IV metoprolol and Cardizem. 5. Chronic diastolic congestive heart failure: Currently clinically compensated. Monitor closely. Diuretics currently on hold. Reduce IV fluids.  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   [name] 12/07/2011,4:13 PM  LOS: 2 days

## 2011-12-08 ENCOUNTER — Inpatient Hospital Stay (HOSPITAL_COMMUNITY): Payer: Medicare Other

## 2011-12-08 DIAGNOSIS — I4891 Unspecified atrial fibrillation: Secondary | ICD-10-CM

## 2011-12-08 DIAGNOSIS — I10 Essential (primary) hypertension: Secondary | ICD-10-CM

## 2011-12-08 DIAGNOSIS — E118 Type 2 diabetes mellitus with unspecified complications: Secondary | ICD-10-CM

## 2011-12-08 DIAGNOSIS — E1165 Type 2 diabetes mellitus with hyperglycemia: Secondary | ICD-10-CM

## 2011-12-08 DIAGNOSIS — K56609 Unspecified intestinal obstruction, unspecified as to partial versus complete obstruction: Secondary | ICD-10-CM

## 2011-12-08 LAB — BASIC METABOLIC PANEL
Calcium: 9 mg/dL (ref 8.4–10.5)
Creatinine, Ser: 0.74 mg/dL (ref 0.50–1.10)
GFR calc non Af Amer: 73 mL/min — ABNORMAL LOW (ref >90)
Glucose, Bld: 100 mg/dL — ABNORMAL HIGH (ref 70–99)
Sodium: 140 mEq/L (ref 135–145)

## 2011-12-08 LAB — GLUCOSE, CAPILLARY
Glucose-Capillary: 84 mg/dL (ref 70–99)
Glucose-Capillary: 93 mg/dL (ref 70–99)
Glucose-Capillary: 91 mg/dL (ref 70–99)

## 2011-12-08 LAB — CBC
Hemoglobin: 15.6 g/dL — ABNORMAL HIGH (ref 12.0–15.0)
MCH: 30.6 pg (ref 26.0–34.0)
MCHC: 33.7 g/dL (ref 30.0–36.0)
Platelets: 150 10*3/uL (ref 150–400)

## 2011-12-08 LAB — PROTIME-INR: INR: 1.92 — ABNORMAL HIGH (ref 0.00–1.49)

## 2011-12-08 MED ORDER — FUROSEMIDE 40 MG PO TABS
40.0000 mg | ORAL_TABLET | Freq: Every day | ORAL | Status: DC
  Administered 2011-12-08: 40 mg via ORAL
  Filled 2011-12-08 (×2): qty 1

## 2011-12-08 MED ORDER — DILTIAZEM HCL ER COATED BEADS 360 MG PO CP24
360.0000 mg | ORAL_CAPSULE | Freq: Every day | ORAL | Status: DC
  Administered 2011-12-08 – 2011-12-10 (×3): 360 mg via ORAL
  Filled 2011-12-08 (×3): qty 1

## 2011-12-08 MED ORDER — INSULIN ASPART 100 UNIT/ML ~~LOC~~ SOLN
0.0000 [IU] | Freq: Three times a day (TID) | SUBCUTANEOUS | Status: DC
  Administered 2011-12-09 – 2011-12-10 (×3): 1 [IU] via SUBCUTANEOUS

## 2011-12-08 MED ORDER — METOPROLOL TARTRATE 1 MG/ML IV SOLN: 5.0000 mg | INTRAVENOUS | Status: DC | PRN

## 2011-12-08 MED ORDER — FUROSEMIDE 80 MG PO TABS
80.0000 mg | ORAL_TABLET | ORAL | Status: DC
  Administered 2011-12-08 – 2011-12-10 (×3): 80 mg via ORAL
  Filled 2011-12-08 (×5): qty 1

## 2011-12-08 MED ORDER — WARFARIN SODIUM 5 MG PO TABS
5.0000 mg | ORAL_TABLET | Freq: Once | ORAL | Status: AC
  Administered 2011-12-08: 5 mg via ORAL
  Filled 2011-12-08: qty 1

## 2011-12-08 MED ORDER — SIMVASTATIN 10 MG PO TABS
10.0000 mg | ORAL_TABLET | Freq: Every day | ORAL | Status: DC
  Administered 2011-12-08 – 2011-12-09 (×2): 10 mg via ORAL
  Filled 2011-12-08 (×3): qty 1

## 2011-12-08 MED ORDER — LEVOTHYROXINE SODIUM 200 MCG PO TABS
200.0000 ug | ORAL_TABLET | Freq: Every day | ORAL | Status: DC
  Administered 2011-12-09 – 2011-12-10 (×2): 200 ug via ORAL
  Filled 2011-12-08 (×3): qty 1

## 2011-12-08 MED ORDER — FUROSEMIDE 40 MG PO TABS
40.0000 mg | ORAL_TABLET | Freq: Two times a day (BID) | ORAL | Status: DC
  Filled 2011-12-08 (×2): qty 2

## 2011-12-08 MED ORDER — WARFARIN - PHARMACIST DOSING INPATIENT
Freq: Every day | Status: DC
  Administered 2011-12-09: 18:00:00

## 2011-12-08 MED ORDER — BISACODYL 5 MG PO TBEC
5.0000 mg | DELAYED_RELEASE_TABLET | Freq: Every day | ORAL | Status: DC
  Administered 2011-12-08 – 2011-12-10 (×3): 5 mg via ORAL
  Filled 2011-12-08 (×3): qty 1

## 2011-12-08 NOTE — Progress Notes (Signed)
12/08/2011 8:22 AM Nursing notes ngt clamped per orders. Pt. Advised to notify rn promptly of any nausea.  Desmond Tufano, Blanchard Kelch

## 2011-12-08 NOTE — Progress Notes (Signed)
ANTICOAGULATION CONSULT NOTE - Initial Consult  Pharmacy Consult for coumadin Indication: atrial fibrillation  Allergies  Allergen Reactions  . Codeine     unknown  . Morphine And Related     sick  . Statins     sick    Patient Measurements: Height: 5\' 6"  (167.6 cm) Weight: 218 lb 1.6 oz (98.93 kg) IBW/kg (Calculated) : 59.3   Vital Signs: Temp: 97.7 F (36.5 C) (06/08 0813) Temp src: Oral (06/08 0813) BP: 145/84 mmHg (06/08 0813) Pulse Rate: 85  (06/08 0813)  Labs:  Basename 12/08/11 0600 12/07/11 0630 12/06/11 1055 12/06/11 0345 12/05/11 2128  HGB 15.6* 14.4 -- -- --  HCT 46.3* 44.0 -- 46.3* --  PLT 150 177 -- 193 --  APTT -- -- -- -- --  LABPROT 22.3* 26.5* -- -- 25.9*  INR 1.92* 2.39* -- -- 2.32*  HEPARINUNFRC -- -- -- -- --  CREATININE 0.74 0.92 -- 0.87 --  CKTOTAL -- -- 95 94 --  CKMB -- -- 3.8 3.8 --  TROPONINI -- -- <0.30 <0.30 --    Estimated Creatinine Clearance: 55.4 ml/min (by C-G formula based on Cr of 0.74).   Medical History: Past Medical History  Diagnosis Date  . Diabetes mellitus   . Hypertension   . SBO (small bowel obstruction)   . Colon cancer   . Persistent atrial fibrillation     Medications:  Prescriptions prior to admission  Medication Sig Dispense Refill  . bisacodyl (DULCOLAX) 10 MG suppository Place 10 mg rectally as needed. For constipation      . bisacodyl (DULCOLAX) 5 MG EC tablet Take 5 mg by mouth daily.      Marland Kitchen diltiazem (CARDIZEM CD) 360 MG 24 hr capsule Take 1 capsule (360 mg total) by mouth daily.      . ferrous sulfate 325 (65 FE) MG tablet Take 325 mg by mouth daily with breakfast.        . furosemide (LASIX) 40 MG tablet Take 40-80 mg by mouth 2 (two) times daily. 2 tab in the morning and 1 in the evening      . levothyroxine (SYNTHROID, LEVOTHROID) 200 MCG tablet Take 200 mcg by mouth every morning.        . pravastatin (PRAVACHOL) 20 MG tablet Take 20 mg by mouth daily.      . ramipril (ALTACE) 10 MG capsule  Take 10 mg by mouth every morning.        . warfarin (COUMADIN) 5 MG tablet Take 5 mg by mouth every evening. On mon, wed, and frid take 2.5mg  and take 5mg  all other days        Assessment: 53 yof admitted with SBO on chronic coumadin for afib. INR today is 1.93. Her last dose was PTA on 6/5. No bleeding noted. CBC is ok. Coumadin to resume today along with other PO meds.   Goal of Therapy:  INR 2-3 Monitor platelets by anticoagulation protocol: Yes   Plan:  1. Coumadin 5mg  PO x 1 tonight 2. Daily INR  Mckinley Adelstein, Drake Leach 12/08/2011,9:15 AM

## 2011-12-08 NOTE — Progress Notes (Signed)
Pt well known to me. I have stopped by for a social visit today.  She appears to be making progress.  Her afib is reasonably controlled. I agree with Dr Ellin Saba plan of restarting home meds once taking POs.  Will see as needed. Call with questions.   Fayrene Fearing Aeralyn Barna,MD

## 2011-12-08 NOTE — Progress Notes (Signed)
Subjective:   "I feel 100% better" Had a BM last night  Objective  Vital signs in last 24 hours: Filed Vitals:   12/07/11 1154 12/07/11 2014 12/08/11 0050 12/08/11 0630  BP: 92/58 157/92 150/84 150/80  Pulse:  93 92 103  Temp:  97.2 F (36.2 C)  97.3 F (36.3 C)  TempSrc:  Oral  Oral  Resp:  20  20  Height:      Weight:      SpO2:  93%  91%   Weight change:   Intake/Output Summary (Last 24 hours) at 12/08/11 0805 Last data filed at 12/08/11 1610  Gross per 24 hour  Intake   4115 ml  Output    925 ml  Net   3190 ml    Physical Exam:  General Exam: Comfortable. Sitting on a reclining chair.  Respiratory System: Occasional basal crackles but otherwise clear to auscultation. No increased work of breathing.  Cardiovascular System: Irreg Irregular mild JVD. 2+ bilateral lower extremity pitting edema.  Gastrointestinal System: Abdomen is mildly distended, soft and normal bowel sounds heard. Nontender. NG tube is draining dark bilious material 200 cc through the night. Diminished bowel sounds  Central Nervous System: Alert and oriented. No focal neurological deficits.   Labs:  Basic Metabolic Panel:  Lab 12/07/11 9604 12/06/11 0345 12/05/11 2116  NA 143 139 139  K 4.1 4.1 4.2  CL 108 100 97  CO2 23 23 27   GLUCOSE 102* 131* 140*  BUN 18 18 17   CREATININE 0.92 0.87 0.93  CALCIUM 8.6 9.9 10.3  ALB -- -- --  PHOS -- -- --   Liver Function Tests:  Lab 12/06/11 0345 12/05/11 2116  AST 30 30  ALT 20 20  ALKPHOS 74 81  BILITOT 0.7 0.6  PROT 7.1 7.8  ALBUMIN 3.8 4.3   No results found for this basename: LIPASE:3,AMYLASE:3 in the last 168 hours No results found for this basename: AMMONIA:3 in the last 168 hours CBC:  Lab 12/08/11 0600 12/07/11 0630 12/06/11 0345 12/05/11 2116  WBC 6.8 7.4 9.7 --  NEUTROABS -- -- -- 9.7*  HGB 15.6* 14.4 15.8* --  HCT 46.3* 44.0 46.3* --  MCV 90.8 92.1 90.8 90.8  PLT 150 177 193 --   Cardiac Enzymes:  Lab 12/06/11 1055  12/06/11 0345  CKTOTAL 95 94  CKMB 3.8 3.8  CKMBINDEX -- --  TROPONINI <0.30 <0.30   CBG:  Lab 12/08/11 0409 12/08/11 0027 12/07/11 2007 12/07/11 1632 12/07/11 1140  GLUCAP 91 84 81 79 85   INR: 2.39   Iron Studies: No results found for this basename: IRON,TIBC,TRANSFERRIN,FERRITIN in the last 72 hours Studies/Results: Dg Chest Portable 1 View  12/05/2011  *RADIOLOGY REPORT*  Clinical Data: Shortness of breath.  PORTABLE CHEST - 1 VIEW  Comparison: 01/18/2011.  Findings: The heart is enlarged but stable.  The mediastinal and hilar contours are slightly prominent but unchanged.  The lungs are clear.  No pleural effusion.  Stable calcified granuloma in the right lung.  The bony structures are intact.  IMPRESSION: Stable cardiac enlargement but no acute pulmonary findings.  Original Report Authenticated By: P. Loralie Champagne, M.D.   Dg Abd Portable 1v  12/05/2011  *RADIOLOGY REPORT*  Clinical Data: The abdominal pain.  PORTABLE ABDOMEN - 1 VIEW  Comparison: CT scan 11/20/2011.  Findings: There is scattered air and stool in the right colon.  A mildly distended mid small bowel loop is noted.  Possible early or partial small bowel  obstruction or sentinel loop from a local inflammatory process.  No free air.  IMPRESSION: Mildly dilated mid small bowel loop could be due to an early or partial small bowel obstruction or local inflammatory process.  Original Report Authenticated By: P. Loralie Champagne, M.D.   I have personally reviewed the AXR from today - No air fluid levels   Medications:    . DISCONTD: sodium chloride 100 mL/hr at 12/08/11 4098  . DISCONTD: diltiazem (CARDIZEM) infusion 5 mg/hr (12/08/11 0052)      . bisacodyl  5 mg Oral Daily  . ciprofloxacin  400 mg Intravenous Q12H  . diltiazem  360 mg Oral Daily  . furosemide  40-80 mg Oral BID  . insulin aspart  0-9 Units Subcutaneous Q4H  . levothyroxine  200 mcg Oral QAC breakfast  . metronidazole  500 mg Intravenous Q8H  .  pneumococcal 23 valent vaccine  0.5 mL Intramuscular Tomorrow-1000  . simvastatin  10 mg Oral q1800  . sodium chloride  3 mL Intravenous Q12H  . DISCONTD: metoprolol  5 mg Intravenous Q6H      Problem/Plan: Principal Problem:  *SBO (small bowel obstruction) Active Problems:  Chronic diastolic heart failure  Atrial fibrillation  1. Recurrent partial small bowel obstruction: Likely secondary to adhesions versus? Inflammatory process (per CT on 11/20/11) vs due to constipation - improved with enema - Advance diet and clamp NG tube.  Ambulate as tolerated. Continue empiric IV Cipro and Flagyl for now. 2. Atrial fibrillation with rapid ventricular rate on admission  and anticoagulated: Discussed with Dr. Johney Frame her primary cardiologist on 6/6 and 6/8. Patient received scheduled IV metoprolol and Cardizem infusion at 5 mg per hour while NPO . To resume po Cardizem and po coumadin 6/8 3. Type 2 diabetes mellitus: Sliding scale insulin and monitor CBGs. Controlled. Acute on Chronic diastolic congestive heart failure: Volume overloaded 6/8 due to iv fluids . Stop NS and start po lasix   [name] 12/08/2011,8:05 AM  LOS: 3 days  Pager [phone]

## 2011-12-08 NOTE — Progress Notes (Signed)
12/08/2011 11:38 AM  Nursing note Cardizem gtt d/c per orders 50 min after po dose given. NGT d/c per orders as well. Pt. Tolerated well. Will continue to monitor.  Nazeer Romney, Blanchard Kelch

## 2011-12-09 DIAGNOSIS — K56609 Unspecified intestinal obstruction, unspecified as to partial versus complete obstruction: Secondary | ICD-10-CM

## 2011-12-09 DIAGNOSIS — I4891 Unspecified atrial fibrillation: Secondary | ICD-10-CM

## 2011-12-09 DIAGNOSIS — E876 Hypokalemia: Secondary | ICD-10-CM | POA: Diagnosis not present

## 2011-12-09 DIAGNOSIS — I10 Essential (primary) hypertension: Secondary | ICD-10-CM

## 2011-12-09 DIAGNOSIS — E118 Type 2 diabetes mellitus with unspecified complications: Secondary | ICD-10-CM

## 2011-12-09 DIAGNOSIS — E1165 Type 2 diabetes mellitus with hyperglycemia: Secondary | ICD-10-CM

## 2011-12-09 LAB — PROTIME-INR
INR: 1.96 — ABNORMAL HIGH (ref 0.00–1.49)
Prothrombin Time: 22.7 seconds — ABNORMAL HIGH (ref 11.6–15.2)

## 2011-12-09 LAB — BASIC METABOLIC PANEL
GFR calc non Af Amer: 73 mL/min — ABNORMAL LOW (ref >90)
Glucose, Bld: 116 mg/dL — ABNORMAL HIGH (ref 70–99)
Potassium: 2.9 mEq/L — ABNORMAL LOW (ref 3.5–5.1)
Sodium: 141 mEq/L (ref 135–145)

## 2011-12-09 LAB — GLUCOSE, CAPILLARY: Glucose-Capillary: 142 mg/dL — ABNORMAL HIGH (ref 70–99)

## 2011-12-09 MED ORDER — POTASSIUM CHLORIDE CRYS ER 20 MEQ PO TBCR
40.0000 meq | EXTENDED_RELEASE_TABLET | Freq: Three times a day (TID) | ORAL | Status: AC
  Administered 2011-12-09 (×3): 40 meq via ORAL
  Filled 2011-12-09 (×3): qty 2

## 2011-12-09 MED ORDER — CIPROFLOXACIN HCL 500 MG PO TABS
500.0000 mg | ORAL_TABLET | Freq: Two times a day (BID) | ORAL | Status: DC
  Administered 2011-12-09 – 2011-12-10 (×3): 500 mg via ORAL
  Filled 2011-12-09 (×5): qty 1

## 2011-12-09 MED ORDER — POLYETHYLENE GLYCOL 3350 17 G PO PACK
34.0000 g | PACK | Freq: Once | ORAL | Status: AC
  Administered 2011-12-09: 34 g via ORAL
  Filled 2011-12-09: qty 2

## 2011-12-09 MED ORDER — WARFARIN SODIUM 5 MG PO TABS
5.0000 mg | ORAL_TABLET | Freq: Once | ORAL | Status: AC
  Administered 2011-12-09: 5 mg via ORAL
  Filled 2011-12-09: qty 1

## 2011-12-09 MED ORDER — METRONIDAZOLE 500 MG PO TABS
500.0000 mg | ORAL_TABLET | Freq: Three times a day (TID) | ORAL | Status: DC
  Administered 2011-12-09 – 2011-12-10 (×4): 500 mg via ORAL
  Filled 2011-12-09 (×6): qty 1

## 2011-12-09 NOTE — Progress Notes (Signed)
Subjective:   Patient feels much better, has not had a bowel movement yesterday. She diuresed well and denies any dyspnea  Objective  Vital signs in last 24 hours: Filed Vitals:   12/08/11 1945 12/08/11 1952 12/09/11 0348 12/09/11 0353  BP:  138/84  123/75  Pulse: 84   75  Temp: 98.1 F (36.7 C)   98.5 F (36.9 C)  TempSrc: Oral   Oral  Resp: 18   18  Height:      Weight:   99.428 kg (219 lb 3.2 oz)   SpO2: 93%   97%   Weight change:   Intake/Output Summary (Last 24 hours) at 12/09/11 1425 Last data filed at 12/09/11 1247  Gross per 24 hour  Intake    480 ml  Output   3100 ml  Net  -2620 ml    Physical Exam:  General Exam: Comfortable. Sitting on a reclining chair.  Respiratory System:  clear to auscultation. No increased work of breathing.  Cardiovascular System: Irreg Irregular mild JVD. 2+ bilateral lower extremity pitting edema.  Gastrointestinal System: Abdomen is mildly distended, soft and normal bowel sounds heard. Nontender.  Central Nervous System: Alert and oriented. No focal neurological deficits.   Labs:  Basic Metabolic Panel:  Lab 12/09/11 4098 12/08/11 0600 12/07/11 0630  NA 141 140 143  K 2.9* 3.9 4.1  CL 102 104 108  CO2 28 19 23   GLUCOSE 116* 100* 102*  BUN 8 14 18   CREATININE 0.72 0.74 0.92  CALCIUM 8.5 9.0 8.6  ALB -- -- --  PHOS -- -- --   Liver Function Tests:  Lab 12/06/11 0345 12/05/11 2116  AST 30 30  ALT 20 20  ALKPHOS 74 81  BILITOT 0.7 0.6  PROT 7.1 7.8  ALBUMIN 3.8 4.3   No results found for this basename: LIPASE:3,AMYLASE:3 in the last 168 hours No results found for this basename: AMMONIA:3 in the last 168 hours CBC:  Lab 12/08/11 0600 12/07/11 0630 12/06/11 0345 12/05/11 2116  WBC 6.8 7.4 9.7 --  NEUTROABS -- -- -- 9.7*  HGB 15.6* 14.4 15.8* --  HCT 46.3* 44.0 46.3* --  MCV 90.8 92.1 90.8 90.8  PLT 150 177 193 --   Cardiac Enzymes:  Lab 12/06/11 1055 12/06/11 0345  CKTOTAL 95 94  CKMB 3.8 3.8  CKMBINDEX --  --  TROPONINI <0.30 <0.30   CBG:  Lab 12/09/11 1116 12/09/11 0548 12/08/11 2054 12/08/11 1619 12/08/11 1114  GLUCAP 143* 108* 91 145* 116*   INR: 2.39   Iron Studies: No results found for this basename: IRON,TIBC,TRANSFERRIN,FERRITIN in the last 72 hours Studies/Results: Dg Chest Portable 1 View  12/05/2011  *RADIOLOGY REPORT*  Clinical Data: Shortness of breath.  PORTABLE CHEST - 1 VIEW  Comparison: 01/18/2011.  Findings: The heart is enlarged but stable.  The mediastinal and hilar contours are slightly prominent but unchanged.  The lungs are clear.  No pleural effusion.  Stable calcified granuloma in the right lung.  The bony structures are intact.  IMPRESSION: Stable cardiac enlargement but no acute pulmonary findings.  Original Report Authenticated By: P. Loralie Champagne, M.D.   Dg Abd Portable 1v  12/05/2011  *RADIOLOGY REPORT*  Clinical Data: The abdominal pain.  PORTABLE ABDOMEN - 1 VIEW  Comparison: CT scan 11/20/2011.  Findings: There is scattered air and stool in the right colon.  A mildly distended mid small bowel loop is noted.  Possible early or partial small bowel obstruction or sentinel loop from a  local inflammatory process.  No free air.  IMPRESSION: Mildly dilated mid small bowel loop could be due to an early or partial small bowel obstruction or local inflammatory process.  Original Report Authenticated By: P. Loralie Champagne, M.D.    Dg. Abdomen 6/8 RADIOLOGY REPORT*  Clinical Data: Recent small bowel obstruction. No reported  abdominal pain or vomiting today.  ABDOMEN - 1 VIEW  Comparison: 12/07/2011 and 12/05/2011.  Findings: Nasogastric tube remains in the distal stomach. The  bowel gas pattern is nonobstructive. There is no supine evidence  of free intraperitoneal air. Bilateral pelvic calcifications are  unchanged. The osseous structures appear stable.  IMPRESSION:  Normal bowel gas pattern. No evidence of residual mechanical  obstruction.  Original Report  Authenticated By: Gerrianne Scale, M.D.   Medications:      . bisacodyl  5 mg Oral Daily  . ciprofloxacin  500 mg Oral BID  . diltiazem  360 mg Oral Daily  . furosemide  80 mg Oral Q24H  . insulin aspart  0-9 Units Subcutaneous TID AC & HS  . levothyroxine  200 mcg Oral QAC breakfast  . metroNIDAZOLE  500 mg Oral Q8H  . polyethylene glycol  34 g Oral Once  . potassium chloride  40 mEq Oral TID  . simvastatin  10 mg Oral q1800  . sodium chloride  3 mL Intravenous Q12H  . warfarin  5 mg Oral ONCE-1800  . warfarin  5 mg Oral ONCE-1800  . Warfarin - Pharmacist Dosing Inpatient   Does not apply q1800  . DISCONTD: ciprofloxacin  400 mg Intravenous Q12H  . DISCONTD: furosemide  40 mg Oral q1800  . DISCONTD: insulin aspart  0-9 Units Subcutaneous Q4H  . DISCONTD: metronidazole  500 mg Intravenous Q8H      Problem/Plan: Principal Problem:  *SBO (small bowel obstruction) Active Problems:  Chronic diastolic heart failure  Atrial fibrillation  Hypokalemia  1. Recurrent partial small bowel obstruction: Likely secondary to adhesions versus? Inflammatory process (per CT on 11/20/11) vs due to constipation - improved with enema and abx  And NG tube suctioning- Advanced diet and clamped NG tube on 12/08/11.  Ambulated as tolerated. Continue empiric Cipro and Flagyl for now.  2. Atrial fibrillation with rapid ventricular rate on admission  and anticoagulated: Discussed with Dr. Johney Frame her primary cardiologist on 6/6 and 6/8. Patient received scheduled IV metoprolol and Cardizem infusion at 5 mg per hour while NPO .  resumed po Cardizem and po coumadin 6/8 3. Type 2 diabetes mellitus: Sliding scale insulin and monitor CBGs. Controlled.  4.  Acute on Chronic diastolic congestive heart failure: Volume overloaded 6/8 due to iv fluids . Stop NS and started po lasix #5 hypokalemia from diuresis. Replete potassium   Noelene Gang 12/09/2011,2:25 PM  LOS: 4 days  Pager [phone]

## 2011-12-09 NOTE — Progress Notes (Signed)
Pt ambulated in hallway 500 feet with rolling walker and standby assist. Pt tolerated it well.  Alfonso Ellis, RN

## 2011-12-09 NOTE — Progress Notes (Signed)
ANTICOAGULATION CONSULT NOTE - Follow-Up Consult  Pharmacy Consult for coumadin Indication: atrial fibrillation  Allergies  Allergen Reactions  . Codeine     unknown  . Morphine And Related     sick  . Statins     sick    Labs:  Basename 12/09/11 0600 12/08/11 0600 12/07/11 0630  HGB -- 15.6* 14.4  HCT -- 46.3* 44.0  PLT -- 150 177  APTT -- -- --  LABPROT 22.7* 22.3* 26.5*  INR 1.96* 1.92* 2.39*  HEPARINUNFRC -- -- --  CREATININE 0.72 0.74 0.92  CKTOTAL -- -- --  CKMB -- -- --  TROPONINI -- -- --    Estimated Creatinine Clearance: 55.6 ml/min (by C-G formula based on Cr of 0.72).    Assessment: 30 yof admitted with SBO on chronic coumadin for afib. INR today is 1.96. Her last dose was PTA on 6/5. No bleeding noted. CBC is ok. Coumadin to resume today along with other PO meds.   Goal of Therapy:  INR 2-3 Monitor platelets by anticoagulation protocol: Yes   Plan:  1.  Repeat Coumadin 5mg  PO x 1 tonight 2. Daily INR  Elwin Sleight 12/09/2011,10:57 AM

## 2011-12-10 DIAGNOSIS — I4891 Unspecified atrial fibrillation: Secondary | ICD-10-CM

## 2011-12-10 DIAGNOSIS — E118 Type 2 diabetes mellitus with unspecified complications: Secondary | ICD-10-CM

## 2011-12-10 DIAGNOSIS — K56609 Unspecified intestinal obstruction, unspecified as to partial versus complete obstruction: Secondary | ICD-10-CM

## 2011-12-10 DIAGNOSIS — I10 Essential (primary) hypertension: Secondary | ICD-10-CM

## 2011-12-10 DIAGNOSIS — E1165 Type 2 diabetes mellitus with hyperglycemia: Secondary | ICD-10-CM

## 2011-12-10 LAB — GLUCOSE, CAPILLARY
Glucose-Capillary: 94 mg/dL (ref 70–99)
Glucose-Capillary: 134 mg/dL — ABNORMAL HIGH (ref 70–99)

## 2011-12-10 LAB — BASIC METABOLIC PANEL
CO2: 25 mEq/L (ref 19–32)
Chloride: 101 mEq/L (ref 96–112)
Potassium: 4 mEq/L (ref 3.5–5.1)
Sodium: 138 mEq/L (ref 135–145)

## 2011-12-10 LAB — PROTIME-INR: INR: 2.28 — ABNORMAL HIGH (ref 0.00–1.49)

## 2011-12-10 MED ORDER — POLYETHYLENE GLYCOL 3350 17 G PO PACK
34.0000 g | PACK | Freq: Once | ORAL | Status: DC
  Filled 2011-12-10 (×2): qty 2

## 2011-12-10 MED ORDER — CIPROFLOXACIN HCL 500 MG PO TABS: 500.0000 mg | ORAL_TABLET | Freq: Two times a day (BID) | ORAL | Status: AC

## 2011-12-10 MED ORDER — POLYETHYLENE GLYCOL 3350 17 G PO PACK: 34.0000 g | PACK | Freq: Every day | ORAL | Status: AC

## 2011-12-10 MED ORDER — METRONIDAZOLE 500 MG PO TABS: 500.0000 mg | ORAL_TABLET | Freq: Three times a day (TID) | ORAL | Status: AC

## 2011-12-10 MED ORDER — POTASSIUM CHLORIDE ER 10 MEQ PO TBCR: 10.0000 meq | EXTENDED_RELEASE_TABLET | Freq: Every day | ORAL | Status: DC

## 2011-12-10 NOTE — Discharge Summary (Signed)
Physician Discharge Summary  [name]:[id number] DOB: 04-29-1920 DOA: 12/05/2011  PCP: [name], MD, MD  Admit date: 12/05/2011 Discharge date: 12/10/2011  Recommendations for Outpatient Follow-up:  1. PT/INR  Discharge Diagnoses:  Principal Problem:  *SBO (small bowel obstruction) Active Problems:  Chronic diastolic heart failure  Atrial fibrillation  Hypokalemia  Hospital Course:   1. 1. Recurrent partial small bowel obstruction: Likely secondary to adhesions versus? Inflammatory process (per CT on 11/20/11) vs due to constipation - improved with enema and abx And NG tube suctioning- Advanced diet and clamped NG tube on 12/08/11. The patient's diet was advanced without further abdominal pain nausea or vomiting . The patient had a good bowel movement on the day of the discharge . For now we will continue empiric Cipro and Flagyl for 2 more days. 2. Atrial fibrillation with rapid ventricular rate on admission: Discussed with Dr. Johney Frame her primary cardiologist on 6/6 and 6/8. Patient received scheduled IV metoprolol and Cardizem infusion at 5 mg per hour while NPO . Resumed po Cardizem and po coumadin 6/8. Discharge INR is 2.2 3. Type 2 diabetes mellitus: The patient was kept on Sliding scale insulin while in hospital.  4. Acute on Chronic diastolic congestive heart failure: Volume overloaded 6/8 due to iv fluids . Significantly improved after discontinuation of intravenous fluids and resumption of oral diuretic #5 hypokalemia from diuresis. Replete potassium   Discharge Condition: Good  Diet recommendation: Heart healthy and carb modified     Procedures:  None  Consultations:  None  Discharge Exam: Filed Vitals:   12/10/11 0416  BP: 112/68  Pulse: 80  Temp: 98.2 F (36.8 C)  Resp: 16   Filed Vitals:   12/09/11 0353 12/09/11 1457 12/09/11 2006 12/10/11 0416  BP: 123/75 124/80 131/71 112/68  Pulse: 75 80 77 80  Temp: 98.5 F (36.9 C) 97.2 F (36.2  C) 97.8 F (36.6 C) 98.2 F (36.8 C)  TempSrc: Oral Oral Oral Oral  Resp: 18 18 18 16   Height:      Weight:      SpO2: 97% 98% 95% 94%   General: Alert oriented x3 Cardiovascular: Irregularly irregular rate controlled and without murmurs Respiratory: Clear to auscultation bilaterally Abdomen is slightly distended nontender good bowel sounds  Discharge Instructions  Discharge Orders    Future Orders Please Complete By Expires   Diet - low sodium heart healthy      Increase activity slowly        Medication List  As of 12/10/2011  9:10 AM   STOP taking these medications         ferrous sulfate 325 (65 FE) MG tablet         TAKE these medications         bisacodyl 10 MG suppository   Commonly known as: DULCOLAX   Place 10 mg rectally as needed. For constipation      bisacodyl 5 MG EC tablet   Commonly known as: DULCOLAX   Take 5 mg by mouth daily.      CARDIZEM CD 360 MG 24 hr capsule   Generic drug: diltiazem   Take 1 capsule (360 mg total) by mouth daily.      ciprofloxacin 500 MG tablet   Commonly known as: CIPRO   Take 1 tablet (500 mg total) by mouth 2 (two) times daily.      furosemide 40 MG tablet   Commonly known as: LASIX   Take 40-80 mg by mouth 2 (  two) times daily. 2 tab in the morning and 1 in the evening      levothyroxine 200 MCG tablet   Commonly known as: SYNTHROID, LEVOTHROID   Take 200 mcg by mouth every morning.      metroNIDAZOLE 500 MG tablet   Commonly known as: FLAGYL   Take 1 tablet (500 mg total) by mouth every 8 (eight) hours.      polyethylene glycol packet   Commonly known as: MIRALAX / GLYCOLAX   Take 34 g by mouth daily.      potassium chloride 10 MEQ tablet   Commonly known as: K-DUR   Take 1 tablet (10 mEq total) by mouth daily.      pravastatin 20 MG tablet   Commonly known as: PRAVACHOL   Take 20 mg by mouth daily.      ramipril 10 MG capsule   Commonly known as: ALTACE   Take 10 mg by mouth every morning.       warfarin 5 MG tablet   Commonly known as: COUMADIN   Take 5 mg by mouth every evening. On mon, wed, and frid take 2.5mg  and take 5mg  all other days              The results of significant diagnostics from this hospitalization (including imaging, microbiology, ancillary and laboratory) are listed below for reference.    Significant Diagnostic Studies: Dg Abd 1 View  12/08/2011  *RADIOLOGY REPORT*  Clinical Data: Recent small bowel obstruction.  No reported abdominal pain or vomiting today.  ABDOMEN - 1 VIEW  Comparison: 12/07/2011 and 12/05/2011.  Findings: Nasogastric tube remains in the distal stomach.  The bowel gas pattern is nonobstructive.  There is no supine evidence of free intraperitoneal air.  Bilateral pelvic calcifications are unchanged.  The osseous structures appear stable.  IMPRESSION: Normal bowel gas pattern.  No evidence of residual mechanical obstruction.  Original Report Authenticated By: Gerrianne Scale, M.D.   Abd 1 View (kub)  12/07/2011  *RADIOLOGY REPORT*  Clinical Data: A small bowel obstruction.  ABDOMEN - 1 VIEW  Comparison: 12/05/2011.  Findings: There is an NG tube in the stomach.  The tip is in the antropyloric region.  The bowel gas pattern is slightly improved with decreased and small bowel distention and slight increase and colonic air.  No free air.  IMPRESSION:  1.  NG tube in the stomach. 2.  Improved bowel gas pattern.  Original Report Authenticated By: P. Loralie Champagne, M.D.   Ct Abdomen Pelvis W Contrast  11/20/2011  *RADIOLOGY REPORT*  Clinical Data: Abdominal pain and swelling.  Vomiting.  CT ABDOMEN AND PELVIS WITH CONTRAST  Technique:  Multidetector CT imaging of the abdomen and pelvis was performed following the standard protocol during bolus administration of intravenous contrast.  Contrast: OMNIPAQUE IOHEXOL 300 MG/ML  SOLN, 1 OMNIPAQUE IOHEXOL 300 MG/ML  SOLN  Comparison: Multiple exams, including 11/20/2011 and 01/06/2011  Findings: Stable  scarring noted medially in the left lower lobe. Cardiomegaly is present involving all four chambers.  There is also a pericardial effusion, similar to last year's exam.  Mild prominence the lateral segment left hepatic lobe noted without definite marginal nodularity of the liver.  The portal vein and splenic vein appear patent and the spleen appears unremarkable.  The pancreas and adrenal glands appear normal.  A 1.6 cm left renal angiomyolipoma is present along with several relatively stable renal cystic lesions bilaterally.  A 1.7 x 1.0 cm exophytic cystic lesion  of the left mid kidney on image 31 of series 2 is technically nonspecific although statistically likely to be benign. The ligament of Treitz is essentially in the midline, suggesting malrotation.  There is wall thickening of a segment of jejunum on image 50 of series 2 measuring about 10 cm in length, with dilated segments of small bowel with proximal and distal to this loop, and transition from dilated to nondilated small bowel seen after this segment.  An obvious lead point for obstruction is not identified, and orally administered contrast extends to the colon, although a distal small bowel is nondistended.  The transverse and sigmoid colon are mildly redundant.  Aortoiliac atherosclerotic calcification is present.  Degenerative sclerosis noted in the pubic symphysis.  Exaggerated lumbar lordosis is present with multilevel facet arthropathy and multilevel osseous foraminal stenosis.  Scattered sigmoid diverticula are present without definite active diverticulitis.  Appears to be fluid density in the sigmoid colon which may reflect a diarrheal process.  Degenerative arthropathy of the hips noted, right greater than left.  Urinary bladder appears unremarkable.  Uterus is absent.  IMPRESSION:  1.  Several mildly dilated loops of small bowel are present and one segment of jejunum has some wall thickening possibly reflecting enteritis.  Slow transition from  proximal dilated to distal nondilated small bowel noted without obvious lead point.  Orally administered contrast extends through to the colon, and accordingly there is no high-grade obstruction.  I suspect that the appearance reflects proximal enteritis with ileus. 2.  Fluid density in the distal colon favors concomitant diarrheal process. 3.  Atherosclerosis. 4.  Cardiomegaly. 5.  Chronic pericardial effusion and trace right pleural effusion. 6.  Small but complex exophytic left renal lesion could be a complex cyst or a small mass such as a small renal cell carcinoma, and measures 1.7 x 1.0 cm. This could be further worked up with renal protocol MRI with without contrast, if clinically warranted. 7.  Stable left renal angiomyolipoma. 8.  Osteitis pubis versus degenerative sclerosis in the pubic bodies. 9.  Degenerative arthropathy of the hips. 10.  Lumbar spondylosis resulting in multilevel foraminal stenosis. 11.  Midline ligament of Treitz, borderline for malrotation.  Original Report Authenticated By: Dellia Cloud, M.D.   Dg Chest Portable 1 View  12/05/2011  *RADIOLOGY REPORT*  Clinical Data: Shortness of breath.  PORTABLE CHEST - 1 VIEW  Comparison: 01/18/2011.  Findings: The heart is enlarged but stable.  The mediastinal and hilar contours are slightly prominent but unchanged.  The lungs are clear.  No pleural effusion.  Stable calcified granuloma in the right lung.  The bony structures are intact.  IMPRESSION: Stable cardiac enlargement but no acute pulmonary findings.  Original Report Authenticated By: P. Loralie Champagne, M.D.   Dg Abd Acute W/chest  11/20/2011  *RADIOLOGY REPORT*  Clinical Data: Abdominal distention  ACUTE ABDOMEN SERIES (ABDOMEN 2 VIEW & CHEST 1 VIEW)  Comparison: Abdominal radiographs 03/27/2011, chest radiograph 03/07/2011  Findings: Enlargement of cardiac silhouette with pulmonary vascular congestion. Tortuous aorta. Prominence of central pulmonary arteries appears stable.  Minimal scarring medial left lower lobe. Lungs otherwise clear. No pleural effusion or pneumothorax. Diffuse osseous demineralization. Nonobstructive bowel gas pattern. No bowel dilatation, bowel wall thickening, or free intraperitoneal air. No urinary tract calcification.  IMPRESSION: Enlargement of cardiac silhouette with pulmonary vascular congestion and question pulmonary arterial hypertension. No acute abdominal findings.  Original Report Authenticated By: Lollie Marrow, M.D.   Dg Abd Portable 1v  12/05/2011  *RADIOLOGY REPORT*  Clinical Data: The abdominal pain.  PORTABLE ABDOMEN - 1 VIEW  Comparison: CT scan 11/20/2011.  Findings: There is scattered air and stool in the right colon.  A mildly distended mid small bowel loop is noted.  Possible early or partial small bowel obstruction or sentinel loop from a local inflammatory process.  No free air.  IMPRESSION: Mildly dilated mid small bowel loop could be due to an early or partial small bowel obstruction or local inflammatory process.  Original Report Authenticated By: P. Loralie Champagne, M.D.    Microbiology: No results found for this or any previous visit (from the past 240 hour(s)).   Labs: Basic Metabolic Panel:  Lab 12/10/11 1191 12/09/11 0600 12/08/11 0600 12/07/11 0630 12/06/11 0345  NA 138 141 140 143 139  K 4.0 2.9* 3.9 4.1 4.1  CL 101 102 104 108 100  CO2 25 28 19 23 23   GLUCOSE 115* 116* 100* 102* 131*  BUN 8 8 14 18 18   CREATININE 0.79 0.72 0.74 0.92 0.87  CALCIUM 8.6 8.5 9.0 8.6 9.9  MG -- -- -- -- --  PHOS -- -- -- -- --   Liver Function Tests:  Lab 12/06/11 0345 12/05/11 2116  AST 30 30  ALT 20 20  ALKPHOS 74 81  BILITOT 0.7 0.6  PROT 7.1 7.8  ALBUMIN 3.8 4.3   No results found for this basename: LIPASE:5,AMYLASE:5 in the last 168 hours No results found for this basename: AMMONIA:5 in the last 168 hours CBC:  Lab 12/08/11 0600 12/07/11 0630 12/06/11 0345 12/05/11 2116  WBC 6.8 7.4 9.7 11.3*  NEUTROABS -- --  -- 9.7*  HGB 15.6* 14.4 15.8* 16.5*  HCT 46.3* 44.0 46.3* 49.2*  MCV 90.8 92.1 90.8 90.8  PLT 150 177 193 202   Cardiac Enzymes:  Lab 12/06/11 1055 12/06/11 0345  CKTOTAL 95 94  CKMB 3.8 3.8  CKMBINDEX -- --  TROPONINI <0.30 <0.30   BNP: BNP (last 3 results) No results found for this basename: PROBNP:3 in the last 8760 hours CBG:  Lab 12/10/11 0551 12/09/11 2023 12/09/11 1623 12/09/11 1116 12/09/11 0548  GLUCAP 94 142* 88 143* 108*   Results for MARITA, BURNSED (MRN [id number]) as of 12/10/2011 10:29  Ref. Range 12/06/2011 10:55 12/07/2011 06:30 12/08/2011 06:00 12/09/2011 06:00 12/10/2011 06:40  Prothrombin Time Latest Range: 11.6-15.2 seconds  26.5 (H) 22.3 (H) 22.7 (H) 25.5 (H)  INR Latest Range: 0.00-1.49   2.39 (H) 1.92 (H) 1.96 (H) 2.28 (H)   Time coordinating discharge: 45 minutes  Signed:  Lonia Blood, MD  [facility] 12/10/2011, 9:10 AM

## 2011-12-11 ENCOUNTER — Emergency Department (HOSPITAL_COMMUNITY)
Admission: EM | Admit: 2011-12-11 | Discharge: 2011-12-11 | Disposition: A | Discharge status: Home / Self Care | Payer: Medicare Other | Attending: Emergency Medicine | Admitting: Emergency Medicine

## 2011-12-11 ENCOUNTER — Emergency Department (HOSPITAL_COMMUNITY): Payer: Medicare Other

## 2011-12-11 ENCOUNTER — Encounter (HOSPITAL_COMMUNITY): Payer: Self-pay | Admitting: Emergency Medicine

## 2011-12-11 DIAGNOSIS — Z85038 Personal history of other malignant neoplasm of large intestine: Secondary | ICD-10-CM | POA: Insufficient documentation

## 2011-12-11 DIAGNOSIS — Z79899 Other long term (current) drug therapy: Secondary | ICD-10-CM | POA: Insufficient documentation

## 2011-12-11 DIAGNOSIS — R1110 Vomiting, unspecified: Secondary | ICD-10-CM

## 2011-12-11 DIAGNOSIS — R6889 Other general symptoms and signs: Secondary | ICD-10-CM | POA: Diagnosis not present

## 2011-12-11 DIAGNOSIS — I10 Essential (primary) hypertension: Secondary | ICD-10-CM | POA: Diagnosis not present

## 2011-12-11 DIAGNOSIS — E119 Type 2 diabetes mellitus without complications: Secondary | ICD-10-CM | POA: Diagnosis not present

## 2011-12-11 DIAGNOSIS — I509 Heart failure, unspecified: Secondary | ICD-10-CM | POA: Insufficient documentation

## 2011-12-11 DIAGNOSIS — R112 Nausea with vomiting, unspecified: Secondary | ICD-10-CM | POA: Diagnosis not present

## 2011-12-11 DIAGNOSIS — I4891 Unspecified atrial fibrillation: Secondary | ICD-10-CM | POA: Insufficient documentation

## 2011-12-11 LAB — BASIC METABOLIC PANEL
BUN: 10 mg/dL (ref 6–23)
Calcium: 9.5 mg/dL (ref 8.4–10.5)
GFR calc Af Amer: 46 mL/min — ABNORMAL LOW (ref >90)
GFR calc non Af Amer: 40 mL/min — ABNORMAL LOW (ref >90)
Glucose, Bld: 147 mg/dL — ABNORMAL HIGH (ref 70–99)

## 2011-12-11 MED ORDER — ONDANSETRON 4 MG PO TBDP: 4.0000 mg | ORAL_TABLET | Freq: Three times a day (TID) | ORAL | Status: AC | PRN

## 2011-12-11 MED ORDER — ONDANSETRON HCL 4 MG/2ML IJ SOLN
INTRAMUSCULAR | Status: AC
  Administered 2011-12-11: 04:00:00
  Filled 2011-12-11: qty 2

## 2011-12-11 MED ORDER — ONDANSETRON HCL 4 MG PO TABS: 4.0000 mg | ORAL_TABLET | Freq: Three times a day (TID) | ORAL | Status: AC | PRN

## 2011-12-11 NOTE — ED Notes (Signed)
DAUGHTER CALLED AND UPDATED ON PT'S STATUS.

## 2011-12-11 NOTE — ED Notes (Signed)
TRANSPORTED TO X-RAY. 

## 2011-12-11 NOTE — ED Notes (Signed)
PT. ARRIVED WITH EMS FROM HOME REPORTS NAUSEA AND VOMITTING THIS EVENING , RECENTLY DISCHARGE FROM [facility] YESTERDAY. PT. RECEIVED ZOFRAN 4 MG IV PRIOR TO ARRIVAL . IV AT LEFT HAND BY EMS PTA., REMOVED AT ARRIVAL BY NT DUE TO RESTRICTED EXTREMITY ( LEFT MASTECTOMY).

## 2011-12-11 NOTE — ED Notes (Signed)
ORAL FLUIDS ( H2O) OFFERED.

## 2011-12-11 NOTE — ED Notes (Signed)
Patient tolerated graham crackers and orange juice without vomiting or nausea.  Patient assisted out of department with a wheelchair.

## 2011-12-11 NOTE — ED Notes (Signed)
EKG done by EMT R Manson Passey Old and New EKG given to Dr Norlene Campbell.

## 2011-12-11 NOTE — ED Notes (Signed)
Patient assisted to [facility].

## 2011-12-11 NOTE — ED Notes (Signed)
No vomiting noted, patient was provided with graham crackers and orange juice for a PO challenge per Dr. Sharman Cheek orders.  Patient appears to be tolerating well.

## 2011-12-11 NOTE — ED Notes (Signed)
NO NAUSEA AFTER DRINKING WATER. EDP NOTIFIED.

## 2011-12-11 NOTE — Discharge Instructions (Signed)
Take nausea medication as needed for nausea and vomiting.  Stick to a clear liquid diet for 24 hours then advance slowly to bland diet and then regular.  Return to the ER for worsening condition or new concerning symptoms.  Clear Liquid Diet The clear liquid dietconsists of foods that are liquid or will become liquid at room temperature.You should be able to see through the liquid and beverages. Examples of foods allowed on a clear liquid diet include fruit juice, broth or bouillon, gelatin, or frozen ice pops. The purpose of this diet is to provide necessary fluid, electrolytes such as sodium and potassium, and energy to keep the body functioning during times when you are not able to consume a regular diet.A clear liquid diet should not be continued for long periods of time as it is not nutritionally adequate.  REASONS FOR USING A CLEAR LIQUID DIET  In sudden onset (acute) conditions for a patient before or after surgery.   As the first step in oral feeding.   For fluid and electrolyte replacement in diarrheal diseases.   As a diet before certain medical tests are performed.  ADEQUACY The clear liquid diet is adequate only in ascorbic acid, according to the Recommended Dietary Allowances of the [employer]. CHOOSING FOODS Breads and Starches  Allowed:  None are allowed.   Avoid: All are avoided.  Vegetables  Allowed:  Strained tomato or vegetable juice.   Avoid: Any others.  Fruit  Allowed:  Strained fruit juices and fruit drinks. Include 1 serving of citrus or vitamin C-enriched fruit juice daily.   Avoid: Any others.  Meat and Meat Substitutes  Allowed:  None are allowed.   Avoid: All are avoided.  Milk  Allowed:  None are allowed.   Avoid: All are avoided.  Soups and Combination Foods  Allowed:  Clear bouillon, broth, or strained broth-based soups.   Avoid: Any others.  Desserts and Sweets  Allowed:  Sugar, honey. High protein gelatin. Flavored  gelatin, ices, or frozen ice pops that do not contain milk.   Avoid: Any others.  Fats and Oils  Allowed:  None are allowed.   Avoid: All are avoided.  Beverages  Allowed: Cereal beverages, coffee (regular or decaffeinated), tea, or soda at the discretion of your caregiver.   Avoid: Any others.  Condiments  Allowed:  Iodized salt.   Avoid: Any others, including pepper.  Supplements  Allowed:  Liquid nutrition beverages.   Avoid: Any others that contain lactose or fiber.  SAMPLE MEAL PLAN Breakfast  4 oz (120 mL) strained orange juice.    to 1 cup (125 to 250 mL) gelatin (plain or fortified).   1 cup (250 mL) beverage (coffee or tea).   Sugar, if desired.  Midmorning Snack   cup (125 mL) gelatin (plain or fortified).  Lunch  1 cup (250 mL) broth or consomm.   4 oz (120 mL) strained grapefruit juice.    cup (125 mL) gelatin (plain or fortified).   1 cup (250 mL) beverage (coffee or tea).   Sugar, if desired.  Midafternoon Snack   cup (125 mL) fruit ice.    cup (125 mL) strained fruit juice.  Dinner  1 cup (250 mL) broth or consomm.    cup (125 mL) cranberry juice.    cup (125 mL) flavored gelatin (plain or fortified).   1 cup (250 mL) beverage (coffee or tea).   Sugar, if desired.  Evening Snack  4 oz (120 mL) strained apple  juice (vitamin C-fortified).    cup (125 mL) flavored gelatin (plain or fortified).  Document Released: 06/18/2005 Document Revised: 06/07/2011 Document Reviewed: 09/15/2010 [facility] Patient Information 2012 [place], [place].  B.R.A.T. Diet Your doctor has recommended the B.R.A.T. diet for you or your child until the condition improves. This is often used to help control diarrhea and vomiting symptoms. If you or your child can tolerate clear liquids, you may have:  Bananas.   Rice.   Applesauce.   Toast (and other simple starches such as crackers, potatoes, noodles).  Be sure to avoid dairy products, meats,  and fatty foods until symptoms are better. Fruit juices such as apple, grape, and prune juice can make diarrhea worse. Avoid these. Continue this diet for 2 days or as instructed by your caregiver. Document Released: 06/18/2005 Document Revised: 06/07/2011 Document Reviewed: 12/05/2006 [facility] Patient Information 2012 [place], [place].  Nausea and Vomiting Nausea is a sick feeling that often comes before throwing up (vomiting). Vomiting is a reflex where stomach contents come out of your mouth. Vomiting can cause severe loss of body fluids (dehydration). Children and elderly adults can become dehydrated quickly, especially if they also have diarrhea. Nausea and vomiting are symptoms of a condition or disease. It is important to find the cause of your symptoms. CAUSES   Direct irritation of the stomach lining. This irritation can result from increased acid production (gastroesophageal reflux disease), infection, food poisoning, taking certain medicines (such as nonsteroidal anti-inflammatory drugs), alcohol use, or tobacco use.   Signals from the brain.These signals could be caused by a headache, heat exposure, an inner ear disturbance, increased pressure in the brain from injury, infection, a tumor, or a concussion, pain, emotional stimulus, or metabolic problems.   An obstruction in the gastrointestinal tract (bowel obstruction).   Illnesses such as diabetes, hepatitis, gallbladder problems, appendicitis, kidney problems, cancer, sepsis, atypical symptoms of a heart attack, or eating disorders.   Medical treatments such as chemotherapy and radiation.   Receiving medicine that makes you sleep (general anesthetic) during surgery.  DIAGNOSIS Your caregiver may ask for tests to be done if the problems do not improve after a few days. Tests may also be done if symptoms are severe or if the reason for the nausea and vomiting is not clear. Tests may include:  Urine tests.   Blood tests.   Stool  tests.   Cultures (to look for evidence of infection).   X-rays or other imaging studies.  Test results can help your caregiver make decisions about treatment or the need for additional tests. TREATMENT You need to stay well hydrated. Drink frequently but in small amounts.You may wish to drink water, sports drinks, clear broth, or eat frozen ice pops or gelatin dessert to help stay hydrated.When you eat, eating slowly may help prevent nausea.There are also some antinausea medicines that may help prevent nausea. HOME CARE INSTRUCTIONS   Take all medicine as directed by your caregiver.   If you do not have an appetite, do not force yourself to eat. However, you must continue to drink fluids.   If you have an appetite, eat a normal diet unless your caregiver tells you differently.   Eat a variety of complex carbohydrates (rice, wheat, potatoes, bread), lean meats, yogurt, fruits, and vegetables.   Avoid high-fat foods because they are more difficult to digest.   Drink enough water and fluids to keep your urine clear or pale yellow.   If you are dehydrated, ask your caregiver for specific rehydration  instructions. Signs of dehydration may include:   Severe thirst.   Dry lips and mouth.   Dizziness.   Dark urine.   Decreasing urine frequency and amount.   Confusion.   Rapid breathing or pulse.  SEEK IMMEDIATE MEDICAL CARE IF:   You have blood or brown flecks (like coffee grounds) in your vomit.   You have black or bloody stools.   You have a severe headache or stiff neck.   You are confused.   You have severe abdominal pain.   You have chest pain or trouble breathing.   You do not urinate at least once every 8 hours.   You develop cold or clammy skin.   You continue to vomit for longer than 24 to 48 hours.   You have a fever.  MAKE SURE YOU:   Understand these instructions.   Will watch your condition.   Will get help right away if you are not doing well  or get worse.  Document Released: 06/18/2005 Document Revised: 06/07/2011 Document Reviewed: 11/15/2010 [facility] Patient Information 2012 [place], [place].

## 2011-12-12 NOTE — ED Provider Notes (Signed)
History     CSN: [id number]  Arrival date & time 12/11/11  0418   First MD Initiated Contact with Patient 12/11/11 [phone]      Chief Complaint  Patient presents with  . Emesis    (Consider location/radiation/quality/duration/timing/severity/associated sxs/prior treatment) HPI 78 year old female presents to emergency room with complaint of vomiting. Patient was discharged yesterday around 4 PM for small bowel obstruction/ileus. She reports that after being in the hospital with an NG tube and several bowel movements she improved and was able to eat. Patient went home and had small bowl of soup and some ice cream. She woke up just prior to arrival with vomiting. She denies any abdominal pain, no chest pain no shortness of breath no other complaints. Patient reports she received medications from EMS and since that time has been feeling "amazing"  Past Medical History  Diagnosis Date  . Diabetes mellitus   . Hypertension   . SBO (small bowel obstruction)   . Colon cancer   . Persistent atrial fibrillation   . CHF (congestive heart failure)   . Hypokalemia     No past surgical history on file.  No family history on file.  History  Substance Use Topics  . Smoking status: Former Smoker    Quit date: 02/14/1943  . Smokeless tobacco: Not on file  . Alcohol Use: No    OB History    Grav Para Term Preterm Abortions TAB SAB Ect Mult Living                  Review of Systems  All other systems reviewed and are negative.  other than listed in HPI  Allergies  Codeine; Morphine and related; and Statins  Home Medications   Current Outpatient Rx  Name Route Sig Dispense Refill  . BISACODYL 10 MG RE SUPP Rectal Place 10 mg rectally as needed. For constipation    . BISACODYL 5 MG PO TBEC Oral Take 5 mg by mouth daily.    Marland Kitchen CIPROFLOXACIN HCL 500 MG PO TABS Oral Take 1 tablet (500 mg total) by mouth 2 (two) times daily. 4 tablet 0  . DILTIAZEM HCL ER COATED BEADS 360 MG PO CP24  Oral Take 1 capsule (360 mg total) by mouth daily.    . FUROSEMIDE 40 MG PO TABS Oral Take 40-80 mg by mouth 2 (two) times daily. 2 tab in the morning and 1 in the evening    . LEVOTHYROXINE SODIUM 200 MCG PO TABS Oral Take 200 mcg by mouth every morning.      Marland Kitchen PRAVASTATIN SODIUM 20 MG PO TABS Oral Take 20 mg by mouth daily.    Marland Kitchen RAMIPRIL 10 MG PO CAPS Oral Take 10 mg by mouth every morning.      . WARFARIN SODIUM 5 MG PO TABS Oral Take 5 mg by mouth every evening. On mon, wed, and frid take 2.5mg  and take 5mg  all other days    . METRONIDAZOLE 500 MG PO TABS Oral Take 1 tablet (500 mg total) by mouth every 8 (eight) hours. 6 tablet 0  . ONDANSETRON 4 MG PO TBDP Oral Take 1 tablet (4 mg total) by mouth every 8 (eight) hours as needed for nausea. 20 tablet 0  . ONDANSETRON HCL 4 MG PO TABS Oral Take 1 tablet (4 mg total) by mouth every 8 (eight) hours as needed for nausea (or vomiting). 30 tablet 0  . POLYETHYLENE GLYCOL 3350 PO PACK Oral Take 34 g by mouth daily.  14 each   . POTASSIUM CHLORIDE ER 10 MEQ PO TBCR Oral Take 1 tablet (10 mEq total) by mouth daily. 30 tablet 0    BP 136/72  Pulse 83  Temp 97.8 F (36.6 C) (Axillary)  Resp 17  SpO2 98%  Physical Exam  Nursing note and vitals reviewed. Constitutional: She is oriented to person, place, and time. She appears well-developed and well-nourished.  HENT:  Head: Normocephalic and atraumatic.  Nose: Nose normal.  Mouth/Throat: Oropharynx is clear and moist.  Eyes: Conjunctivae and EOM are normal. Pupils are equal, round, and reactive to light.  Neck: Normal range of motion. Neck supple. No JVD present. No tracheal deviation present. No thyromegaly present.  Cardiovascular: Normal rate, regular rhythm, normal heart sounds and intact distal pulses.  Exam reveals no gallop and no friction rub.   No murmur heard. Pulmonary/Chest: Effort normal and breath sounds normal. No stridor. No respiratory distress. She has no wheezes. She has no  rales. She exhibits no tenderness.  Abdominal: Soft. Bowel sounds are normal. She exhibits distension. She exhibits no mass. There is tenderness (mild diffuse nonfocal). There is no rebound and no guarding.  Musculoskeletal: Normal range of motion. She exhibits no edema and no tenderness.  Lymphadenopathy:    She has no cervical adenopathy.  Neurological: She is oriented to person, place, and time. She exhibits normal muscle tone. Coordination normal.  Skin: Skin is dry. No rash noted. No erythema. No pallor.  Psychiatric: She has a normal mood and affect. Her behavior is normal. Judgment and thought content normal.    ED Course  Procedures (including critical care time)  Labs Reviewed  BASIC METABOLIC PANEL - Abnormal; Notable for the following:    Glucose, Bld 147 (*)     Creatinine, Ser 1.17 (*)     GFR calc non Af Amer 40 (*)     GFR calc Af Amer 46 (*)     All other components within normal limits  LAB REPORT - SCANNED   Dg Abd 1 View  12/11/2011  *RADIOLOGY REPORT*  Clinical Data: Vomiting  ABDOMEN - 1 VIEW  Comparison: 12/07/2028  Findings: Gaseous distension over the upper abdomen/right upper quadrant, presumably corresponds to the stomach.  Nonobstructive bowel gas pattern.  Bilateral hip and pubic symphysis DJD. Hemidiaphragms excluded from the image.  IMPRESSION: Nonobstructive bowel gas pattern.  Original Report Authenticated By: Waneta Martins, M.D.    Date: 12/11/2011  Rate: 91  Rhythm: atrial fibrillation and premature ventricular contractions (PVC)  QRS Axis: normal  Intervals: afib  ST/T Wave abnormalities: nonspecific ST/T changes  Conduction Disutrbances:none  Narrative Interpretation: rate slowed  Old EKG Reviewed: changes noted   1. Emesis       MDM   78 yo female d/c yesterday for small bowel obstruction/ileus with episode of vomiting.  KUB nonobstructive, pt feeling well after zofran, tolerating PO.  Will d/c with zofran and f/u with her pcm as  planned.  Pt and daughter given return precautions      Olivia Mackie, MD 12/12/11 [phone]

## 2011-12-17 DIAGNOSIS — Z79899 Other long term (current) drug therapy: Secondary | ICD-10-CM | POA: Diagnosis not present

## 2011-12-17 DIAGNOSIS — E119 Type 2 diabetes mellitus without complications: Secondary | ICD-10-CM | POA: Diagnosis not present

## 2011-12-17 DIAGNOSIS — K56609 Unspecified intestinal obstruction, unspecified as to partial versus complete obstruction: Secondary | ICD-10-CM | POA: Diagnosis not present

## 2011-12-17 DIAGNOSIS — I509 Heart failure, unspecified: Secondary | ICD-10-CM | POA: Diagnosis not present

## 2011-12-17 DIAGNOSIS — I4891 Unspecified atrial fibrillation: Secondary | ICD-10-CM | POA: Diagnosis not present

## 2011-12-17 DIAGNOSIS — E7800 Pure hypercholesterolemia, unspecified: Secondary | ICD-10-CM | POA: Diagnosis not present

## 2012-01-11 DIAGNOSIS — M79609 Pain in unspecified limb: Secondary | ICD-10-CM | POA: Diagnosis not present

## 2012-01-11 DIAGNOSIS — B351 Tinea unguium: Secondary | ICD-10-CM | POA: Diagnosis not present

## 2012-01-17 DIAGNOSIS — H26499 Other secondary cataract, unspecified eye: Secondary | ICD-10-CM | POA: Diagnosis not present

## 2012-01-17 DIAGNOSIS — Z961 Presence of intraocular lens: Secondary | ICD-10-CM | POA: Diagnosis not present

## 2012-01-17 DIAGNOSIS — E119 Type 2 diabetes mellitus without complications: Secondary | ICD-10-CM | POA: Diagnosis not present

## 2012-02-11 DIAGNOSIS — Z7901 Long term (current) use of anticoagulants: Secondary | ICD-10-CM | POA: Diagnosis not present

## 2012-02-21 ENCOUNTER — Ambulatory Visit (INDEPENDENT_AMBULATORY_CARE_PROVIDER_SITE_OTHER): Payer: Medicare Other | Admitting: Internal Medicine

## 2012-02-21 ENCOUNTER — Encounter: Payer: Self-pay | Admitting: Internal Medicine

## 2012-02-21 VITALS — BP 132/80 | HR 77 | Ht 62.0 in | Wt 224.0 lb

## 2012-02-21 DIAGNOSIS — I5032 Chronic diastolic (congestive) heart failure: Secondary | ICD-10-CM

## 2012-02-21 DIAGNOSIS — E876 Hypokalemia: Secondary | ICD-10-CM

## 2012-02-21 DIAGNOSIS — I4891 Unspecified atrial fibrillation: Secondary | ICD-10-CM

## 2012-02-21 LAB — CBC WITH DIFFERENTIAL/PLATELET
Eosinophils Relative: 0.7 % (ref 0.0–5.0)
Lymphocytes Relative: 19.5 % (ref 12.0–46.0)
MCV: 92.6 fl (ref 78.0–100.0)
Monocytes Absolute: 0.5 10*3/uL (ref 0.1–1.0)
Monocytes Relative: 7.5 % (ref 3.0–12.0)
Neutrophils Relative %: 72 % (ref 43.0–77.0)
Platelets: 188 10*3/uL (ref 150.0–400.0)
WBC: 6.2 10*3/uL (ref 4.5–10.5)

## 2012-02-21 LAB — PROTIME-INR
INR: 2.3 ratio — ABNORMAL HIGH (ref 0.8–1.0)
Prothrombin Time: 25.6 s — ABNORMAL HIGH (ref 10.2–12.4)

## 2012-02-21 LAB — BASIC METABOLIC PANEL
BUN: 13 mg/dL (ref 6–23)
Calcium: 8.8 mg/dL (ref 8.4–10.5)
Creatinine, Ser: 0.8 mg/dL (ref 0.4–1.2)
GFR: 73.7 mL/min (ref >60.00)

## 2012-02-21 NOTE — Assessment & Plan Note (Signed)
bmet today 

## 2012-02-21 NOTE — Assessment & Plan Note (Signed)
She would like to switch from coumadin to pradaxa. Her most recent CrCl was >50.  I will start pradaxa 150mg  BID.  If her creatinine clearance is less than 30 in the future, then her dose would need to be adjusted. CBC and INR today. I will ask her to follow-up in the anticoagulation clinic in 4 weeks.

## 2012-02-21 NOTE — Patient Instructions (Addendum)
Your physician wants you to follow-up in: 3 months with Lilian Coma and 6 months with Dr Jacquiline Doe will receive a reminder letter in the mail two months in advance. If you don't receive a letter, please call our office to schedule the follow-up appointment.  Your physician recommends that you schedule a follow-up appointment in: 4 weeks with Weston Brass, Pharm D for Anti Coag visit  Labs today  Check and see how much Pradaxa 150mg  twice daily will be  Call me Dennis Bast [phone]

## 2012-02-21 NOTE — Progress Notes (Signed)
PCP: [name], MD  The patient presents today for routine cardiology followup.  She was recently hospitalized for small bowel obstruction.  Today, she denies symptoms of palpitations, chest pain, shortness of breath, orthopnea, PND, dizziness, presyncope, syncope, or neurologic sequela.  She has stable edema.   She remains active despite her age. The patient feels that she is tolerating medications without difficulties and is otherwise without complaint today.   Past Medical History  Diagnosis Date  . Diabetes mellitus   . Hypertension   . SBO (small bowel obstruction)   . Colon cancer   . Permanent atrial fibrillation   . CHF (congestive heart failure)   . Hypokalemia   . Venous insufficiency    No past surgical history on file.  Current Outpatient Prescriptions  Medication Sig Dispense Refill  . bisacodyl (DULCOLAX) 10 MG suppository Place 10 mg rectally as needed. For constipation      . bisacodyl (DULCOLAX) 5 MG EC tablet Take 5 mg by mouth daily.      Marland Kitchen diltiazem (CARDIZEM CD) 360 MG 24 hr capsule Take 1 capsule (360 mg total) by mouth daily.      . furosemide (LASIX) 40 MG tablet Take 40-80 mg by mouth 2 (two) times daily. 2 tab in the morning and 1 in the evening      . levothyroxine (SYNTHROID, LEVOTHROID) 200 MCG tablet Take 200 mcg by mouth every morning.        . pravastatin (PRAVACHOL) 20 MG tablet Take 20 mg by mouth daily.      . ramipril (ALTACE) 10 MG capsule Take 10 mg by mouth every morning.        . warfarin (COUMADIN) 5 MG tablet Take 5 mg by mouth every evening. On mon, wed, and frid take 2.5mg  and take 5mg  all other days        Allergies  Allergen Reactions  . Codeine     unknown  . Morphine And Related     sick  . Statins     sick    History   Social History  . Marital Status: Widowed    Spouse Name: N/A    Number of Children: N/A  . Years of Education: N/A   Occupational History  . Not on file.   Social History Main Topics  .  Smoking status: Former Smoker    Quit date: 02/14/1943  . Smokeless tobacco: Not on file  . Alcohol Use: No  . Drug Use: No  . Sexually Active: Not on file   Other Topics Concern  . Not on file   Social History Narrative  . No narrative on file     Physical Exam: Filed Vitals:   02/21/12 1350  BP: 132/80  Pulse: 77  Height: 5\' 2"  (1.575 m)  Weight: 224 lb (101.606 kg)  SpO2: 98%    GEN- The patient is elderly appearing, alert and oriented x 3 today.   Head- normocephalic, atraumatic Eyes-  Sclera clear, conjunctiva pink Ears- hearing intact Oropharynx- clear Neck- supple, no JVP Lymph- no cervical lymphadenopathy Lungs- Clear to ausculation bilaterally, normal work of breathing Heart- irregular rate and rhythm, no murmurs, rubs or gallops, PMI not laterally displaced GI- soft, NT, ND, + BS Extremities- no clubbing, cyanosis, 1+ BLE edema MS- no significant deformity or atrophy Skin- no rash or lesion Psych- euthymic mood, full affect Neuro- strength and sensation are intact  Assessment and Plan:

## 2012-02-21 NOTE — Assessment & Plan Note (Signed)
Stable She has venous insufficiency also with chronic edema.  I am not convinced that additional lasix would be beneficial. 2 gram sodium diet. She does not wish to wear support hose.  BMET today

## 2012-02-25 ENCOUNTER — Other Ambulatory Visit: Payer: Self-pay | Admitting: *Deleted

## 2012-02-25 MED ORDER — DABIGATRAN ETEXILATE MESYLATE 150 MG PO CAPS: 150.0000 mg | ORAL_CAPSULE | Freq: Two times a day (BID) | ORAL | Status: DC

## 2012-02-26 ENCOUNTER — Telehealth: Payer: Self-pay | Admitting: Internal Medicine

## 2012-02-26 NOTE — Telephone Encounter (Signed)
Spoke with patient and answered her questions about her Pradaxa

## 2012-02-26 NOTE — Telephone Encounter (Signed)
Please return call to patient [phone], regarding Pradaxa med instructions.

## 2012-02-27 ENCOUNTER — Ambulatory Visit: Payer: Medicare Other | Admitting: Pharmacist

## 2012-03-18 DIAGNOSIS — Z23 Encounter for immunization: Secondary | ICD-10-CM | POA: Diagnosis not present

## 2012-03-18 DIAGNOSIS — E118 Type 2 diabetes mellitus with unspecified complications: Secondary | ICD-10-CM | POA: Diagnosis not present

## 2012-03-18 DIAGNOSIS — I509 Heart failure, unspecified: Secondary | ICD-10-CM | POA: Diagnosis not present

## 2012-03-18 DIAGNOSIS — E876 Hypokalemia: Secondary | ICD-10-CM | POA: Diagnosis not present

## 2012-03-26 ENCOUNTER — Ambulatory Visit (INDEPENDENT_AMBULATORY_CARE_PROVIDER_SITE_OTHER): Payer: Medicare Other | Admitting: Pharmacist

## 2012-03-26 DIAGNOSIS — I4891 Unspecified atrial fibrillation: Secondary | ICD-10-CM

## 2012-03-26 NOTE — Patient Instructions (Addendum)
Pt was started on Pradaxa for Afib on 02/25/12 .   Reviewed patients medication list.  Pt is not currently on any combined P-gp and strong CYP3A4 inhibitors/inducers (ketoconazole, traconazole, ritonavir, carbamazepine, phenytoin, rifampin, St. John's wort).  Reviewed labs.  SCr 0.8, Weight 102kg, CrCl- 51.  Dose appropriate based on CrCl.   Hgb and HCT Within Normal Limits  A full discussion of the nature of anticoagulants has been carried out.  A benefit/risk analysis has been presented to the patient, so that they understand the justification for choosing anticoagulation with [facility]xada at this time.  The need for compliance is stressed.  Pt is aware to take the medication twice daily.  Side effects of potential bleeding are discussed, including unusual colored urine or stools, coughing up blood or coffee ground emesis, nose bleeds or serious fall or head trauma.  Discussed signs and symptoms of stroke. The patient should avoid any OTC items containing aspirin or ibuprofen.  Avoid alcohol consumption.   Call if any signs of abnormal bleeding.  Discussed financial obligations and resolved any difficulty in obtaining medication.  Asked pt to call MD if any medications change or he is scheduled for a procedure. Next lab test test in 6 months.

## 2012-04-11 DIAGNOSIS — B351 Tinea unguium: Secondary | ICD-10-CM | POA: Diagnosis not present

## 2012-04-11 DIAGNOSIS — M79609 Pain in unspecified limb: Secondary | ICD-10-CM | POA: Diagnosis not present

## 2012-05-22 ENCOUNTER — Encounter: Payer: Self-pay | Admitting: Physician Assistant

## 2012-05-22 ENCOUNTER — Ambulatory Visit (INDEPENDENT_AMBULATORY_CARE_PROVIDER_SITE_OTHER): Payer: Medicare Other | Admitting: Physician Assistant

## 2012-05-22 VITALS — BP 155/69 | HR 62 | Ht 61.0 in | Wt 225.0 lb

## 2012-05-22 DIAGNOSIS — I5032 Chronic diastolic (congestive) heart failure: Secondary | ICD-10-CM

## 2012-05-22 DIAGNOSIS — R609 Edema, unspecified: Secondary | ICD-10-CM | POA: Diagnosis not present

## 2012-05-22 DIAGNOSIS — I4891 Unspecified atrial fibrillation: Secondary | ICD-10-CM

## 2012-05-22 DIAGNOSIS — I10 Essential (primary) hypertension: Secondary | ICD-10-CM

## 2012-05-22 LAB — CBC WITH DIFFERENTIAL/PLATELET
Basophils Relative: 0.2 % (ref 0.0–3.0)
Eosinophils Relative: 0.7 % (ref 0.0–5.0)
Lymphocytes Relative: 18.9 % (ref 12.0–46.0)
MCV: 95.8 fl (ref 78.0–100.0)
Monocytes Absolute: 0.5 10*3/uL (ref 0.1–1.0)
Neutrophils Relative %: 71 % (ref 43.0–77.0)
RBC: 4.11 Mil/uL (ref 3.87–5.11)
WBC: 5.8 10*3/uL (ref 4.5–10.5)

## 2012-05-22 LAB — BASIC METABOLIC PANEL
Chloride: 105 mEq/L (ref 96–112)
Creatinine, Ser: 0.7 mg/dL (ref 0.4–1.2)
Sodium: 139 mEq/L (ref 135–145)

## 2012-05-22 MED ORDER — FUROSEMIDE 40 MG PO TABS: ORAL_TABLET | ORAL | Status: DC

## 2012-05-22 NOTE — Progress Notes (Signed)
[address], [address] [place], [place]  [address] Phone: [phone], Fax:  [phone]  Date:  05/22/2012   Name:  [name]   DOB:  1919/08/28   MRN:  [id number]  PCP:  [name], MD  Primary Cardiologist:  Dr. Hillis Range  Primary Electrophysiologist:  Dr. Hillis Range    History of Present Illness: [name] is a 78 y.o. female who returns for followup.  She has a history of permanent atrial fibrillation, diastolic CHF, DM2, HTN, HL, sleep apnea, hypothyroidism. She also has a history of breast, bladder, cervical and ovarian cancer as well as recurrent small bowel obstructions secondary to adhesions. Echo 5/12: Mild LVH, EF 55-60%, mild AI, mild MR, severe LAE, mild RAE, PASP 31, small pericardial effusion. Last seen by Dr. Johney Frame 8/13. She was changed from Coumadin to Pradaxa at that time.  Since last being seen, she is stable. She ambulates with a walker. She is probably class IIb-III.  She denies chest pain. She does note nausea with over exertion. This seems to be chronic.  She denies orthopnea or PND. She denies syncope. She has chronic LE edema. She believes that it may be somewhat worse.  Labs (8/13):    K 3.5, creatinine 0.8, Hgb 12.8  Wt Readings from Last 3 Encounters:  05/22/12 225 lb (102.059 kg)  02/21/12 224 lb (101.606 kg)  12/09/11 219 lb 3.2 oz (99.428 kg)     Past Medical History  Diagnosis Date  . DM2 (diabetes mellitus, type 2)   . Hypertension   . SBO (small bowel obstruction)   . Colon cancer   . Permanent atrial fibrillation     a. coumadin d/c'd => Pradaxa in 05/2012  . Chronic diastolic heart failure     a. Echo 5/12: Mild LVH, EF 55-60%, mild AI, mild MR, severe LAE, mild RAE, PASP 31, small pericardial effusion  . Venous insufficiency     chronic LE edema  . HLD (hyperlipidemia)   . OSA (obstructive sleep apnea)   . Breast cancer   . Cervical cancer   . Ovarian cancer   . Bladder cancer   .  Hypothyroidism     Current Outpatient Prescriptions  Medication Sig Dispense Refill  . bisacodyl (DULCOLAX) 10 MG suppository Place 10 mg rectally as needed. For constipation      . bisacodyl (DULCOLAX) 5 MG EC tablet Take 5 mg by mouth daily.      . dabigatran (PRADAXA) 150 MG CAPS Take 1 capsule (150 mg total) by mouth every 12 (twelve) hours.  60 capsule  2  . dabigatran (PRADAXA) 150 MG CAPS Take 1 capsule (150 mg total) by mouth every 12 (twelve) hours.  180 capsule  3  . diltiazem (CARDIZEM CD) 360 MG 24 hr capsule Take 1 capsule (360 mg total) by mouth daily.      . furosemide (LASIX) 40 MG tablet Take 40-80 mg by mouth 2 (two) times daily. 2 tab in the morning and 1 in the evening      . levothyroxine (SYNTHROID, LEVOTHROID) 200 MCG tablet Take 200 mcg by mouth every morning.        . pravastatin (PRAVACHOL) 20 MG tablet Take 20 mg by mouth daily.      . ramipril (ALTACE) 10 MG capsule Take 10 mg by mouth every morning.          Allergies: Allergies  Allergen Reactions  . Codeine     unknown  .  Morphine And Related     sick  . Statins     sick    Social History:  The patient  reports that she quit smoking about 69 years ago. She does not have any smokeless tobacco history on file. She reports that she does not drink alcohol or use illicit drugs.   ROS:  Please see the history of present illness.   She denies any bleeding.   All other systems reviewed and negative.   PHYSICAL EXAM: VS:  BP 155/69  Pulse 62  Ht 5\' 1"  (1.549 m)  Wt 225 lb (102.059 kg)  BMI 42.51 kg/m2 Well nourished, well developed, in no acute distress HEENT: normal Neck: no JVD at 90 Cardiac:  normal S1, S2; irregularly irregular rhythm; no murmur Lungs:  clear to auscultation bilaterally, no wheezing, rhonchi or rales Abd: soft, nontender, no hepatomegaly Ext: Tight 1-2+ bilateral shiny edema Skin: warm and dry Neuro:  CNs 2-12 intact, no focal abnormalities noted  EKG:  Atrial fibrillation, HR  92, low-voltage, no significant change compared to prior tracing      ASSESSMENT AND PLAN:  1. Atrial Fibrillation:   Rate is controlled. She is now on Pradaxa. She actually feels better. She notes several side effects to Coumadin and these have all resolved since she made the transition. She denies any symptoms of bleeding. Check a basic metabolic panel today to followup on her renal function. Check a CBC to also followup on her hemoglobin. Followup with Dr. Johney Frame in 3 months as planned.  2. Chronic Diastolic CHF:   Her LE edema is more related to chronic venous insufficiency than chronic diastolic CHF. I have recommended that she increase her Lasix to 80 mg twice daily on Monday, Wednesday, Friday and continue her regular dose all other days. Check a followup basic metabolic panel in 2 weeks. She has been advised to keep her legs elevated as much as possible.  3. Hypertension:   Blood pressures are typically better controlled. Continue current therapy and continue to monitor for now.  4. Disposition:  Followup with Dr. Johney Frame in 3 months as planned.   Signed, Tereso Newcomer, PA-C  2:05 PM 05/22/2012

## 2012-05-22 NOTE — Patient Instructions (Addendum)
CHANGE LASIX TO 80 MG TWICE DAILY ON MOND, WED, AND FRI'S ALL OTHER DAYS YOU WILL TAKE 80 MG IN THE MORNING AND 40 MG IN THE PM  Your physician recommends that you return for lab work in: TODAY BMET, CBC W/DIFF  REPEAT LAB BMET ON 06/06/12  KEEP YOUR LEGS ELEVATED WHEN SITTING  PLEASE MAKE AN APPOINTMENT TO SEE DR. ALLRED IN 3 MONTHS

## 2012-05-23 ENCOUNTER — Telehealth: Payer: Self-pay | Admitting: *Deleted

## 2012-05-23 ENCOUNTER — Telehealth: Payer: Self-pay | Admitting: Internal Medicine

## 2012-05-23 DIAGNOSIS — I4891 Unspecified atrial fibrillation: Secondary | ICD-10-CM

## 2012-05-23 MED ORDER — POTASSIUM CHLORIDE CRYS ER 20 MEQ PO TBCR: 20.0000 meq | EXTENDED_RELEASE_TABLET | Freq: Two times a day (BID) | ORAL | Status: DC

## 2012-05-23 MED ORDER — DABIGATRAN ETEXILATE MESYLATE 150 MG PO CAPS: 150.0000 mg | ORAL_CAPSULE | Freq: Two times a day (BID) | ORAL | Status: DC

## 2012-05-23 NOTE — Telephone Encounter (Signed)
Message copied by Tarri Fuller on Fri May 23, 2012 11:08 AM ------      Message from: [place], [place] T      Created: Thu May 22, 2012  5:22 PM       K+ low        -  Start K+ 20 mEq bid        -  Check BMET in 2 weeks      Hgb stable/normal      Tereso Newcomer, PA-C  5:22 PM 05/22/2012

## 2012-05-23 NOTE — Telephone Encounter (Signed)
Message copied by Tarri Fuller on Fri May 23, 2012 10:38 AM ------      Message from: [place], [place] T      Created: Thu May 22, 2012  5:22 PM       K+ low        -  Start K+ 20 mEq bid        -  Check BMET in 2 weeks      Hgb stable/normal      Tereso Newcomer, PA-C  5:22 PM 05/22/2012

## 2012-05-23 NOTE — Telephone Encounter (Signed)
s/w Kris pt's daughter and she has been notified about lab results and to have pt start K+ 20, bmet 06/09/12 

## 2012-05-23 NOTE — Telephone Encounter (Signed)
Message copied by Tarri Fuller on Fri May 23, 2012 10:23 AM ------      Message from: [place], [place] T      Created: Thu May 22, 2012  5:22 PM       K+ low        -  Start K+ 20 mEq bid        -  Check BMET in 2 weeks      Hgb stable/normal      Tereso Newcomer, PA-C  5:22 PM 05/22/2012

## 2012-05-23 NOTE — Telephone Encounter (Signed)
no answer @ pt's # so I lmom pt daughter [name]  tcb to go over lab results and to start K+ 20 BID, with bmet 2 weeks; bmet ordered for 06/06/12 and bmet sent to [address]

## 2012-05-23 NOTE — Telephone Encounter (Signed)
F/u    Returning call back to [facility].

## 2012-05-23 NOTE — Telephone Encounter (Signed)
RX SENT TO [facility] [address], BMET 06/06/12

## 2012-05-23 NOTE — Telephone Encounter (Signed)
s/w [name] pt's daughter and she has been notified about lab results and to have pt start K+ 20, bmet 06/09/12

## 2012-06-06 ENCOUNTER — Other Ambulatory Visit: Payer: Medicare Other

## 2012-06-09 ENCOUNTER — Other Ambulatory Visit (INDEPENDENT_AMBULATORY_CARE_PROVIDER_SITE_OTHER): Payer: Medicare Other

## 2012-06-09 ENCOUNTER — Telehealth: Payer: Self-pay | Admitting: *Deleted

## 2012-06-09 DIAGNOSIS — I4891 Unspecified atrial fibrillation: Secondary | ICD-10-CM | POA: Diagnosis not present

## 2012-06-09 DIAGNOSIS — I10 Essential (primary) hypertension: Secondary | ICD-10-CM | POA: Diagnosis not present

## 2012-06-09 DIAGNOSIS — R609 Edema, unspecified: Secondary | ICD-10-CM | POA: Diagnosis not present

## 2012-06-09 DIAGNOSIS — I5032 Chronic diastolic (congestive) heart failure: Secondary | ICD-10-CM

## 2012-06-09 LAB — BASIC METABOLIC PANEL
BUN: 15 mg/dL (ref 6–23)
Chloride: 101 mEq/L (ref 96–112)
Glucose, Bld: 140 mg/dL — ABNORMAL HIGH (ref 70–99)
Potassium: 3.8 mEq/L (ref 3.5–5.1)

## 2012-06-09 NOTE — Telephone Encounter (Signed)
Message copied by Tarri Fuller on Mon Jun 09, 2012  5:36 PM ------      Message from: [place], [place] T      Created: Mon Jun 09, 2012  1:40 PM       Potassium and kidney function look good.      Continue with current treatment plan.      Tereso Newcomer, PA-C  4:49 PM 05/15/2012

## 2012-06-09 NOTE — Telephone Encounter (Signed)
pt notified about lab results w/verbal understanding today 

## 2012-07-04 DIAGNOSIS — B351 Tinea unguium: Secondary | ICD-10-CM | POA: Diagnosis not present

## 2012-07-04 DIAGNOSIS — M79609 Pain in unspecified limb: Secondary | ICD-10-CM | POA: Diagnosis not present

## 2012-07-24 ENCOUNTER — Encounter: Payer: Self-pay | Admitting: Internal Medicine

## 2012-07-24 DIAGNOSIS — Z1331 Encounter for screening for depression: Secondary | ICD-10-CM | POA: Diagnosis not present

## 2012-07-24 DIAGNOSIS — Z23 Encounter for immunization: Secondary | ICD-10-CM | POA: Diagnosis not present

## 2012-07-24 DIAGNOSIS — I509 Heart failure, unspecified: Secondary | ICD-10-CM | POA: Diagnosis not present

## 2012-07-24 DIAGNOSIS — I10 Essential (primary) hypertension: Secondary | ICD-10-CM | POA: Diagnosis not present

## 2012-07-24 DIAGNOSIS — E785 Hyperlipidemia, unspecified: Secondary | ICD-10-CM | POA: Diagnosis not present

## 2012-07-24 DIAGNOSIS — E118 Type 2 diabetes mellitus with unspecified complications: Secondary | ICD-10-CM | POA: Diagnosis not present

## 2012-07-24 DIAGNOSIS — I4891 Unspecified atrial fibrillation: Secondary | ICD-10-CM | POA: Diagnosis not present

## 2012-07-25 ENCOUNTER — Ambulatory Visit
Admission: RE | Admit: 2012-07-25 | Discharge: 2012-07-25 | Disposition: A | Discharge status: Home / Self Care | Payer: Medicare Other | Source: Ambulatory Visit | Attending: Family Medicine | Admitting: Family Medicine

## 2012-07-25 ENCOUNTER — Other Ambulatory Visit: Payer: Self-pay | Admitting: Family Medicine

## 2012-07-25 DIAGNOSIS — D200 Benign neoplasm of soft tissue of retroperitoneum: Secondary | ICD-10-CM | POA: Diagnosis not present

## 2012-07-25 DIAGNOSIS — R1013 Epigastric pain: Secondary | ICD-10-CM

## 2012-07-25 DIAGNOSIS — N281 Cyst of kidney, acquired: Secondary | ICD-10-CM | POA: Diagnosis not present

## 2012-07-25 DIAGNOSIS — R7989 Other specified abnormal findings of blood chemistry: Secondary | ICD-10-CM

## 2012-07-25 MED ORDER — IOHEXOL 300 MG/ML  SOLN
30.0000 mL | Freq: Once | INTRAMUSCULAR | Status: AC | PRN
  Administered 2012-07-25: 30 mL via ORAL

## 2012-07-25 MED ORDER — IOHEXOL 300 MG/ML  SOLN
100.0000 mL | Freq: Once | INTRAMUSCULAR | Status: AC | PRN
  Administered 2012-07-25: 100 mL via INTRAVENOUS

## 2012-08-22 ENCOUNTER — Ambulatory Visit (INDEPENDENT_AMBULATORY_CARE_PROVIDER_SITE_OTHER): Payer: Medicare Other | Admitting: Internal Medicine

## 2012-08-22 ENCOUNTER — Encounter: Payer: Self-pay | Admitting: Internal Medicine

## 2012-08-22 VITALS — BP 132/84 | HR 80 | Wt 215.0 lb

## 2012-08-22 DIAGNOSIS — I4891 Unspecified atrial fibrillation: Secondary | ICD-10-CM

## 2012-08-22 DIAGNOSIS — I5032 Chronic diastolic (congestive) heart failure: Secondary | ICD-10-CM | POA: Diagnosis not present

## 2012-08-22 NOTE — Progress Notes (Signed)
PCP: [name], MD  The patient presents today for routine cardiology followup.  She is doing quite well at this time.  Her GI symptoms have resolved.  Today, she denies symptoms of palpitations, chest pain, shortness of breath, orthopnea, PND, dizziness, presyncope, syncope, or neurologic sequela.  She has stable edema.   She remains active despite her age. The patient feels that she is tolerating medications without difficulties and is otherwise without complaint today.   Past Medical History  Diagnosis Date  . DM2 (diabetes mellitus, type 2)   . Hypertension   . SBO (small bowel obstruction)   . Colon cancer   . Permanent atrial fibrillation     a. coumadin d/c'd => Pradaxa in 05/2012  . Chronic diastolic heart failure     a. Echo 5/12: Mild LVH, EF 55-60%, mild AI, mild MR, severe LAE, mild RAE, PASP 31, small pericardial effusion  . Venous insufficiency     chronic LE edema  . HLD (hyperlipidemia)   . OSA (obstructive sleep apnea)   . Breast cancer   . Cervical cancer   . Ovarian cancer   . Bladder cancer   . Hypothyroidism    No past surgical history on file.  Current Outpatient Prescriptions  Medication Sig Dispense Refill  . bisacodyl (DULCOLAX) 10 MG suppository Place 10 mg rectally as needed. For constipation      . bisacodyl (DULCOLAX) 5 MG EC tablet Take 5 mg by mouth daily.      . dabigatran (PRADAXA) 150 MG CAPS Take 1 capsule (150 mg total) by mouth every 12 (twelve) hours.      Marland Kitchen diltiazem (CARDIZEM CD) 360 MG 24 hr capsule Take 1 capsule (360 mg total) by mouth daily.      . furosemide (LASIX) 40 MG tablet TAKE 80 MG TWICE DAILY ON MON,WED, AND FRI'S. ON ALL OTHER DAYS TAKE 80 MG AM AND 40 MG PM  90 tablet  11  . levothyroxine (SYNTHROID, LEVOTHROID) 200 MCG tablet Take 200 mcg by mouth every morning.        . potassium chloride SA (K-DUR,KLOR-CON) 20 MEQ tablet Take 1 tablet (20 mEq total) by mouth 2 (two) times daily.  60 tablet  11  . pravastatin  (PRAVACHOL) 20 MG tablet Take 20 mg by mouth daily.      . ramipril (ALTACE) 10 MG capsule Take 10 mg by mouth every morning.         No current facility-administered medications for this visit.    Allergies  Allergen Reactions  . Codeine     unknown  . Morphine And Related     sick  . Statins     sick    History   Social History  . Marital Status: Widowed    Spouse Name: N/A    Number of Children: N/A  . Years of Education: N/A   Occupational History  . Not on file.   Social History Main Topics  . Smoking status: Former Smoker    Quit date: 02/14/1943  . Smokeless tobacco: Not on file  . Alcohol Use: No  . Drug Use: No  . Sexually Active: Not on file   Other Topics Concern  . Not on file   Social History Narrative  . No narrative on file     Physical Exam: Filed Vitals:   08/22/12 1436  BP: 132/84  Pulse: 80  Weight: 215 lb (97.523 kg)    GEN- The patient is elderly appearing, alert  and oriented x 3 today.   Head- normocephalic, atraumatic Eyes-  Sclera clear, conjunctiva pink Ears- hearing intact Oropharynx- clear Neck- supple, no JVP Lymph- no cervical lymphadenopathy Lungs- Clear to ausculation bilaterally, normal work of breathing Heart- irregular rate and rhythm, no murmurs, rubs or gallops, PMI not laterally displaced GI- soft, NT, ND, + BS Extremities- no clubbing, cyanosis, 1+ BLE edema MS- no significant deformity or atrophy Skin- no rash or lesion Psych- euthymic mood, full affect Neuro- strength and sensation are intact  Assessment and Plan:

## 2012-08-22 NOTE — Assessment & Plan Note (Signed)
Doing well at his time Continue pradaxa long term

## 2012-08-22 NOTE — Assessment & Plan Note (Signed)
Stable No change required today  

## 2012-08-22 NOTE — Patient Instructions (Addendum)
Your physician wants you to follow-up in: 6 months with Lori Gerhardt, NP and 12 months with Dr Allred You will receive a reminder letter in the mail two months in advance. If you don't receive a letter, please call our office to schedule the follow-up appointment.  

## 2012-09-23 ENCOUNTER — Ambulatory Visit: Payer: Medicare Other | Admitting: Pharmacist

## 2012-09-24 ENCOUNTER — Ambulatory Visit: Payer: Self-pay | Admitting: Pharmacist

## 2012-09-24 ENCOUNTER — Ambulatory Visit (INDEPENDENT_AMBULATORY_CARE_PROVIDER_SITE_OTHER): Payer: Medicare Other | Admitting: Pharmacist

## 2012-09-24 DIAGNOSIS — I4891 Unspecified atrial fibrillation: Secondary | ICD-10-CM

## 2012-09-24 LAB — BASIC METABOLIC PANEL
BUN: 14 mg/dL (ref 6–23)
CO2: 32 mEq/L (ref 19–32)
Calcium: 9.7 mg/dL (ref 8.4–10.5)
Creatinine, Ser: 0.8 mg/dL (ref 0.4–1.2)
Glucose, Bld: 119 mg/dL — ABNORMAL HIGH (ref 70–99)

## 2012-09-24 LAB — CBC
MCHC: 34.3 g/dL (ref 30.0–36.0)
MCV: 94.7 fl (ref 78.0–100.0)

## 2012-09-25 MED ORDER — POTASSIUM CHLORIDE CRYS ER 20 MEQ PO TBCR: 20.0000 meq | EXTENDED_RELEASE_TABLET | Freq: Every day | ORAL | Status: DC

## 2012-09-25 NOTE — Progress Notes (Signed)
Pt was started on Pradaxa for Atrial Fibrillation on 02/25/12.  Reviewed patients medication list.  Pt is not currently on any combined P-gp and strong CYP3A4 inhibitors/inducers (ketoconazole, traconazole, ritonavir, carbamazepine, phenytoin, rifampin, St. John's wort).  Reviewed labs.  SCr 0.8, Weight 97.5 kg, CrCl- 106mL/min.  Dose appropriate based on CrCl.   Hgb and HCT Within Normal Limits  A full discussion of the nature of anticoagulants has been carried out.  A benefit/risk analysis has been presented to the patient, so that they understand the justification for choosing anticoagulation with Praxada at this time.  The need for compliance is stressed.  Pt is aware to take the medication twice daily.  Side effects of potential bleeding are discussed, including unusual colored urine or stools, coughing up blood or coffee ground emesis, nose bleeds or serious fall or head trauma.  Discussed signs and symptoms of stroke. The patient should avoid any OTC items containing aspirin or ibuprofen.  Avoid alcohol consumption.   Call if any signs of abnormal bleeding.  Discussed financial obligations and resolved any difficulty in obtaining medication.  Asked pt to call MD if any medications change or he is scheduled for a procedure. Next lab test test in 6 months.   Reviewed pt's K.  K was 3.9 with her only taking 1 tablet daily.  Told pt to continue taking 1 tablet daily and we would change in in her medication list.

## 2012-10-15 DIAGNOSIS — R05 Cough: Secondary | ICD-10-CM | POA: Diagnosis not present

## 2012-11-10 DIAGNOSIS — M79609 Pain in unspecified limb: Secondary | ICD-10-CM | POA: Diagnosis not present

## 2012-11-10 DIAGNOSIS — B351 Tinea unguium: Secondary | ICD-10-CM | POA: Diagnosis not present

## 2012-11-11 ENCOUNTER — Encounter (HOSPITAL_COMMUNITY): Payer: Self-pay | Admitting: *Deleted

## 2012-11-11 ENCOUNTER — Inpatient Hospital Stay (HOSPITAL_COMMUNITY): Payer: Medicare Other

## 2012-11-11 ENCOUNTER — Inpatient Hospital Stay (HOSPITAL_COMMUNITY)
Admission: EM | Admit: 2012-11-11 | Discharge: 2012-11-14 | DRG: 388 | Disposition: A | Discharge status: Home / Self Care | Payer: Medicare Other | Attending: Internal Medicine | Admitting: Internal Medicine

## 2012-11-11 ENCOUNTER — Emergency Department (HOSPITAL_COMMUNITY): Payer: Medicare Other

## 2012-11-11 DIAGNOSIS — K56609 Unspecified intestinal obstruction, unspecified as to partial versus complete obstruction: Secondary | ICD-10-CM | POA: Diagnosis present

## 2012-11-11 DIAGNOSIS — Q433 Congenital malformations of intestinal fixation: Secondary | ICD-10-CM | POA: Diagnosis not present

## 2012-11-11 DIAGNOSIS — J690 Pneumonitis due to inhalation of food and vomit: Secondary | ICD-10-CM | POA: Diagnosis present

## 2012-11-11 DIAGNOSIS — E119 Type 2 diabetes mellitus without complications: Secondary | ICD-10-CM | POA: Diagnosis present

## 2012-11-11 DIAGNOSIS — Z8543 Personal history of malignant neoplasm of ovary: Secondary | ICD-10-CM

## 2012-11-11 DIAGNOSIS — Z66 Do not resuscitate: Secondary | ICD-10-CM | POA: Diagnosis present

## 2012-11-11 DIAGNOSIS — I5032 Chronic diastolic (congestive) heart failure: Secondary | ICD-10-CM | POA: Diagnosis not present

## 2012-11-11 DIAGNOSIS — L03119 Cellulitis of unspecified part of limb: Secondary | ICD-10-CM | POA: Diagnosis present

## 2012-11-11 DIAGNOSIS — Z7901 Long term (current) use of anticoagulants: Secondary | ICD-10-CM

## 2012-11-11 DIAGNOSIS — Z90710 Acquired absence of both cervix and uterus: Secondary | ICD-10-CM

## 2012-11-11 DIAGNOSIS — E876 Hypokalemia: Secondary | ICD-10-CM | POA: Diagnosis not present

## 2012-11-11 DIAGNOSIS — Z452 Encounter for adjustment and management of vascular access device: Secondary | ICD-10-CM | POA: Diagnosis not present

## 2012-11-11 DIAGNOSIS — J189 Pneumonia, unspecified organism: Secondary | ICD-10-CM | POA: Diagnosis present

## 2012-11-11 DIAGNOSIS — G4733 Obstructive sleep apnea (adult) (pediatric): Secondary | ICD-10-CM | POA: Diagnosis present

## 2012-11-11 DIAGNOSIS — L02419 Cutaneous abscess of limb, unspecified: Secondary | ICD-10-CM | POA: Diagnosis present

## 2012-11-11 DIAGNOSIS — Z8719 Personal history of other diseases of the digestive system: Secondary | ICD-10-CM

## 2012-11-11 DIAGNOSIS — R1084 Generalized abdominal pain: Secondary | ICD-10-CM | POA: Diagnosis not present

## 2012-11-11 DIAGNOSIS — R110 Nausea: Secondary | ICD-10-CM | POA: Diagnosis not present

## 2012-11-11 DIAGNOSIS — E039 Hypothyroidism, unspecified: Secondary | ICD-10-CM | POA: Diagnosis present

## 2012-11-11 DIAGNOSIS — I872 Venous insufficiency (chronic) (peripheral): Secondary | ICD-10-CM | POA: Diagnosis present

## 2012-11-11 DIAGNOSIS — Z8551 Personal history of malignant neoplasm of bladder: Secondary | ICD-10-CM | POA: Diagnosis not present

## 2012-11-11 DIAGNOSIS — J9819 Other pulmonary collapse: Secondary | ICD-10-CM | POA: Diagnosis not present

## 2012-11-11 DIAGNOSIS — C189 Malignant neoplasm of colon, unspecified: Secondary | ICD-10-CM | POA: Diagnosis present

## 2012-11-11 DIAGNOSIS — Z85038 Personal history of other malignant neoplasm of large intestine: Secondary | ICD-10-CM | POA: Diagnosis not present

## 2012-11-11 DIAGNOSIS — I5033 Acute on chronic diastolic (congestive) heart failure: Secondary | ICD-10-CM | POA: Diagnosis present

## 2012-11-11 DIAGNOSIS — I10 Essential (primary) hypertension: Secondary | ICD-10-CM | POA: Diagnosis present

## 2012-11-11 DIAGNOSIS — N289 Disorder of kidney and ureter, unspecified: Secondary | ICD-10-CM | POA: Diagnosis not present

## 2012-11-11 DIAGNOSIS — R1110 Vomiting, unspecified: Secondary | ICD-10-CM | POA: Diagnosis not present

## 2012-11-11 DIAGNOSIS — I4891 Unspecified atrial fibrillation: Secondary | ICD-10-CM | POA: Diagnosis present

## 2012-11-11 DIAGNOSIS — Z87891 Personal history of nicotine dependence: Secondary | ICD-10-CM

## 2012-11-11 DIAGNOSIS — K565 Intestinal adhesions [bands], unspecified as to partial versus complete obstruction: Principal | ICD-10-CM | POA: Diagnosis present

## 2012-11-11 DIAGNOSIS — R112 Nausea with vomiting, unspecified: Secondary | ICD-10-CM | POA: Diagnosis present

## 2012-11-11 DIAGNOSIS — Z8541 Personal history of malignant neoplasm of cervix uteri: Secondary | ICD-10-CM

## 2012-11-11 DIAGNOSIS — E785 Hyperlipidemia, unspecified: Secondary | ICD-10-CM | POA: Diagnosis present

## 2012-11-11 DIAGNOSIS — Z853 Personal history of malignant neoplasm of breast: Secondary | ICD-10-CM

## 2012-11-11 DIAGNOSIS — I5043 Acute on chronic combined systolic (congestive) and diastolic (congestive) heart failure: Secondary | ICD-10-CM | POA: Diagnosis present

## 2012-11-11 DIAGNOSIS — I4821 Permanent atrial fibrillation: Secondary | ICD-10-CM | POA: Diagnosis present

## 2012-11-11 DIAGNOSIS — J90 Pleural effusion, not elsewhere classified: Secondary | ICD-10-CM | POA: Diagnosis not present

## 2012-11-11 HISTORY — DX: Personal history of malignant neoplasm of cervix uteri: Z85.41

## 2012-11-11 HISTORY — DX: Personal history of malignant neoplasm of bladder: Z85.51

## 2012-11-11 HISTORY — DX: Chronic diastolic (congestive) heart failure: I50.32

## 2012-11-11 HISTORY — DX: Acquired absence of both cervix and uterus: Z90.710

## 2012-11-11 HISTORY — DX: Personal history of malignant neoplasm of ovary: Z85.43

## 2012-11-11 LAB — COMPREHENSIVE METABOLIC PANEL
AST: 22 U/L (ref 0–37)
Albumin: 3.8 g/dL (ref 3.5–5.2)
BUN: 13 mg/dL (ref 6–23)
Calcium: 9.6 mg/dL (ref 8.4–10.5)
Chloride: 103 mEq/L (ref 96–112)
Creatinine, Ser: 0.71 mg/dL (ref 0.50–1.10)
Total Bilirubin: 0.8 mg/dL (ref 0.3–1.2)
Total Protein: 7.3 g/dL (ref 6.0–8.3)

## 2012-11-11 LAB — CBC WITH DIFFERENTIAL/PLATELET
Basophils Absolute: 0 10*3/uL (ref 0.0–0.1)
Basophils Relative: 0 % (ref 0–1)
Eosinophils Absolute: 0 10*3/uL (ref 0.0–0.7)
Eosinophils Relative: 0 % (ref 0–5)
HCT: 44 % (ref 36.0–46.0)
Hemoglobin: 15 g/dL (ref 12.0–15.0)
MCH: 31.9 pg (ref 26.0–34.0)
MCHC: 34.1 g/dL (ref 30.0–36.0)
Monocytes Absolute: 0.4 10*3/uL (ref 0.1–1.0)
Monocytes Relative: 5 % (ref 3–12)
Neutro Abs: 6.8 10*3/uL (ref 1.7–7.7)
RDW: 15.8 % — ABNORMAL HIGH (ref 11.5–15.5)

## 2012-11-11 LAB — PROTIME-INR
INR: 1.16 (ref 0.00–1.49)
Prothrombin Time: 14.6 seconds (ref 11.6–15.2)

## 2012-11-11 LAB — CBC
HCT: 42.2 % (ref 36.0–46.0)
MCH: 32 pg (ref 26.0–34.0)
MCV: 93.8 fL (ref 78.0–100.0)
RBC: 4.5 MIL/uL (ref 3.87–5.11)
WBC: 7.7 10*3/uL (ref 4.0–10.5)

## 2012-11-11 LAB — URINALYSIS, ROUTINE W REFLEX MICROSCOPIC
Glucose, UA: NEGATIVE mg/dL
Hgb urine dipstick: NEGATIVE
Ketones, ur: NEGATIVE mg/dL
Protein, ur: 100 mg/dL — AB

## 2012-11-11 LAB — HEPARIN LEVEL (UNFRACTIONATED): Heparin Unfractionated: 0.1 IU/mL — ABNORMAL LOW (ref 0.30–0.70)

## 2012-11-11 LAB — CREATININE, SERUM: GFR calc Af Amer: 82 mL/min — ABNORMAL LOW (ref >90)

## 2012-11-11 LAB — MAGNESIUM: Magnesium: 2.2 mg/dL (ref 1.5–2.5)

## 2012-11-11 LAB — GLUCOSE, CAPILLARY
Glucose-Capillary: 94 mg/dL (ref 70–99)
Glucose-Capillary: 95 mg/dL (ref 70–99)

## 2012-11-11 MED ORDER — ONDANSETRON HCL 4 MG/2ML IJ SOLN: 4.0000 mg | Freq: Four times a day (QID) | INTRAMUSCULAR | Status: DC | PRN

## 2012-11-11 MED ORDER — PIPERACILLIN-TAZOBACTAM 3.375 G IVPB
3.3750 g | Freq: Three times a day (TID) | INTRAVENOUS | Status: DC
  Administered 2012-11-11 – 2012-11-14 (×10): 3.375 g via INTRAVENOUS
  Filled 2012-11-11 (×12): qty 50

## 2012-11-11 MED ORDER — ACETAMINOPHEN 325 MG PO TABS
650.0000 mg | ORAL_TABLET | Freq: Four times a day (QID) | ORAL | Status: DC | PRN
  Filled 2012-11-11: qty 2

## 2012-11-11 MED ORDER — ACETAMINOPHEN 650 MG RE SUPP: 650.0000 mg | Freq: Four times a day (QID) | RECTAL | Status: DC | PRN

## 2012-11-11 MED ORDER — IOHEXOL 300 MG/ML  SOLN
25.0000 mL | INTRAMUSCULAR | Status: AC
  Administered 2012-11-11: 25 mL via ORAL

## 2012-11-11 MED ORDER — HEPARIN (PORCINE) IN NACL 100-0.45 UNIT/ML-% IJ SOLN
1500.0000 [IU]/h | INTRAMUSCULAR | Status: DC
Start: 2012-11-11 — End: 2012-11-12
  Administered 2012-11-11: 1000 [IU]/h via INTRAVENOUS
  Administered 2012-11-12 (×2): 1500 [IU]/h via INTRAVENOUS
  Filled 2012-11-11 (×4): qty 250

## 2012-11-11 MED ORDER — SODIUM CHLORIDE 0.9 % IV BOLUS (SEPSIS)
250.0000 mL | Freq: Once | INTRAVENOUS | Status: AC
  Administered 2012-11-11: 250 mL via INTRAVENOUS

## 2012-11-11 MED ORDER — SODIUM CHLORIDE 0.9 % IJ SOLN
3.0000 mL | Freq: Two times a day (BID) | INTRAMUSCULAR | Status: DC
  Administered 2012-11-11 – 2012-11-13 (×2): 3 mL via INTRAVENOUS

## 2012-11-11 MED ORDER — POTASSIUM CHLORIDE 10 MEQ/100ML IV SOLN
10.0000 meq | INTRAVENOUS | Status: AC
  Administered 2012-11-11: 10 meq via INTRAVENOUS
  Filled 2012-11-11: qty 100

## 2012-11-11 MED ORDER — BISACODYL 10 MG RE SUPP: 10.0000 mg | Freq: Every day | RECTAL | Status: DC | PRN

## 2012-11-11 MED ORDER — SODIUM CHLORIDE 0.9 % IV SOLN
INTRAVENOUS | Status: DC
  Administered 2012-11-11 – 2012-11-13 (×2): via INTRAVENOUS
  Administered 2012-11-13: 75 mL/h via INTRAVENOUS

## 2012-11-11 MED ORDER — LEVOTHYROXINE SODIUM 100 MCG IV SOLR
100.0000 ug | Freq: Every day | INTRAVENOUS | Status: DC
  Administered 2012-11-11 – 2012-11-14 (×4): 100 ug via INTRAVENOUS
  Filled 2012-11-11 (×4): qty 5

## 2012-11-11 MED ORDER — KETOROLAC TROMETHAMINE 30 MG/ML IJ SOLN: 30.0000 mg | Freq: Four times a day (QID) | INTRAMUSCULAR | Status: DC | PRN

## 2012-11-11 MED ORDER — ONDANSETRON HCL 4 MG PO TABS: 4.0000 mg | ORAL_TABLET | Freq: Four times a day (QID) | ORAL | Status: DC | PRN

## 2012-11-11 MED ORDER — LIDOCAINE HCL 2 % EX GEL
Freq: Once | CUTANEOUS | Status: AC
  Administered 2012-11-11: 20
  Filled 2012-11-11: qty 20

## 2012-11-11 MED ORDER — FLEET ENEMA 7-19 GM/118ML RE ENEM
1.0000 | ENEMA | Freq: Once | RECTAL | Status: AC | PRN
  Filled 2012-11-11: qty 1

## 2012-11-11 MED ORDER — ONDANSETRON HCL 4 MG/2ML IJ SOLN
4.0000 mg | Freq: Once | INTRAMUSCULAR | Status: AC
  Administered 2012-11-11: 4 mg via INTRAVENOUS
  Filled 2012-11-11: qty 2

## 2012-11-11 MED ORDER — SODIUM CHLORIDE 0.9 % IJ SOLN
10.0000 mL | INTRAMUSCULAR | Status: DC | PRN
  Administered 2012-11-12: 10 mL

## 2012-11-11 MED ORDER — IOHEXOL 300 MG/ML  SOLN
100.0000 mL | Freq: Once | INTRAMUSCULAR | Status: AC | PRN
  Administered 2012-11-11: 100 mL via INTRAVENOUS

## 2012-11-11 MED ORDER — POTASSIUM CHLORIDE 10 MEQ/100ML IV SOLN
10.0000 meq | INTRAVENOUS | Status: AC
  Administered 2012-11-11 (×4): 10 meq via INTRAVENOUS
  Filled 2012-11-11 (×5): qty 100

## 2012-11-11 MED ORDER — INSULIN ASPART 100 UNIT/ML ~~LOC~~ SOLN
0.0000 [IU] | Freq: Three times a day (TID) | SUBCUTANEOUS | Status: DC
  Administered 2012-11-13: 1 [IU] via SUBCUTANEOUS
  Administered 2012-11-13: 17:00:00 via SUBCUTANEOUS
  Administered 2012-11-14 (×2): 1 [IU] via SUBCUTANEOUS

## 2012-11-11 MED ORDER — METOPROLOL TARTRATE 1 MG/ML IV SOLN
5.0000 mg | Freq: Three times a day (TID) | INTRAVENOUS | Status: DC
  Administered 2012-11-11 (×3): 5 mg via INTRAVENOUS
  Filled 2012-11-11 (×3): qty 5

## 2012-11-11 NOTE — ED Notes (Signed)
Pt. To x-ray .

## 2012-11-11 NOTE — ED Provider Notes (Signed)
History     CSN: [id number]  Arrival date & time 11/11/12  6578   First MD Initiated Contact with Patient 11/11/12 [phone]      Chief Complaint  Patient presents with  . Nausea    (Consider location/radiation/quality/duration/timing/severity/associated sxs/prior treatment) HPI 79 yo female presents to the ER with complaint of n/v since 930 last night along with abdominal distension.  She feels as though she has developed another bowel obstruction.  Pt has had upwards of 10-12 SBO.  She reports BM yesterday after taking ducolax.  No flatus after that BM.  Pt reports she feels better for about an hour after vomiting, but then begins to be bloated and nauseated again. She does not want surgical intervention if she can help it.  No fevers, no change in diet.  Past Medical History  Diagnosis Date  . DM2 (diabetes mellitus, type 2)   . Hypertension   . SBO (small bowel obstruction)   . Colon cancer   . Permanent atrial fibrillation     a. coumadin d/c'd => Pradaxa in 05/2012  . Chronic diastolic heart failure     a. Echo 5/12: Mild LVH, EF 55-60%, mild AI, mild MR, severe LAE, mild RAE, PASP 31, small pericardial effusion  . Venous insufficiency     chronic LE edema  . HLD (hyperlipidemia)   . OSA (obstructive sleep apnea)   . Breast cancer   . Cervical cancer   . Ovarian cancer   . Bladder cancer   . Hypothyroidism     History reviewed. No pertinent past surgical history.  History reviewed. No pertinent family history.  History  Substance Use Topics  . Smoking status: Former Smoker    Quit date: 02/14/1943  . Smokeless tobacco: Not on file  . Alcohol Use: No    OB History   Grav Para Term Preterm Abortions TAB SAB Ect Mult Living                  Review of Systems  All other systems reviewed and are negative.    Allergies  Codeine; Morphine and related; and Statins  Home Medications   Current Outpatient Rx  Name  Route  Sig  Dispense  Refill  . dabigatran  (PRADAXA) 150 MG CAPS   Oral   Take 1 capsule (150 mg total) by mouth every 12 (twelve) hours.         Marland Kitchen diltiazem (CARDIZEM CD) 360 MG 24 hr capsule   Oral   Take 1 capsule (360 mg total) by mouth daily.         . ferrous sulfate 325 (65 FE) MG tablet   Oral   Take 325 mg by mouth 2 (two) times daily.         . furosemide (LASIX) 40 MG tablet   Oral   Take 80 mg by mouth 2 (two) times daily.         Marland Kitchen levothyroxine (SYNTHROID, LEVOTHROID) 200 MCG tablet   Oral   Take 200 mcg by mouth every morning.           . potassium chloride SA (K-DUR,KLOR-CON) 20 MEQ tablet   Oral   Take 1 tablet (20 mEq total) by mouth daily.   90 tablet   3   . ramipril (ALTACE) 10 MG capsule   Oral   Take 10 mg by mouth every morning.             BP 161/102  Pulse 116  Temp(Src) 98 F (36.7 C) (Oral)  Resp 16  SpO2 96%  Physical Exam  Nursing note and vitals reviewed. Constitutional: She is oriented to person, place, and time. She appears well-developed and well-nourished.  HENT:  Head: Normocephalic and atraumatic.  Nose: Nose normal.  Mouth/Throat: Oropharynx is clear and moist.  Eyes: Conjunctivae and EOM are normal. Pupils are equal, round, and reactive to light.  Neck: Normal range of motion. Neck supple. No JVD present. No tracheal deviation present. No thyromegaly present.  Cardiovascular: Normal heart sounds and intact distal pulses.  Exam reveals no gallop and no friction rub.   No murmur heard. Irregular irregular  Pulmonary/Chest: Effort normal and breath sounds normal. No stridor. No respiratory distress. She has no wheezes. She has no rales. She exhibits no tenderness.  Abdominal: Soft. She exhibits distension. She exhibits no mass. There is tenderness (mild diffuse tenderness without rebound or guarding.  non rigid abd). There is no rebound and no guarding.  Absent bowel sounds  Musculoskeletal: Normal range of motion. She exhibits no edema and no tenderness.   Lymphadenopathy:    She has no cervical adenopathy.  Neurological: She is alert and oriented to person, place, and time. She exhibits normal muscle tone. Coordination normal.  Skin: Skin is warm and dry. No rash noted. No erythema. No pallor.  Psychiatric: She has a normal mood and affect. Her behavior is normal. Judgment and thought content normal.    ED Course  Procedures (including critical care time)  Labs Reviewed  CBC WITH DIFFERENTIAL - Abnormal; Notable for the following:    RDW 15.8 (*)    Neutrophils Relative 85 (*)    Lymphocytes Relative 10 (*)    All other components within normal limits  COMPREHENSIVE METABOLIC PANEL - Abnormal; Notable for the following:    Potassium 3.4 (*)    Glucose, Bld 156 (*)    GFR calc non Af Amer 74 (*)    GFR calc Af Amer 85 (*)    All other components within normal limits  PROTIME-INR  MAGNESIUM   Dg Abd Acute W/chest  11/11/2012  *RADIOLOGY REPORT*  Clinical Data: Nausea, vomiting.  ACUTE ABDOMEN SERIES (ABDOMEN 2 VIEW & CHEST 1 VIEW)  Comparison: 10/15/2012 chest radiograph and 07/25/2012 abdominopelvic CT  Findings: Right infrahilar consolidation.  Bihilar fullness. Cardiomegaly.  Aortic atherosclerosis.  Gaseous distension of stomach and a presumably proximal small bowel loops of bowel with air-fluid levels. There is air noted within the colon and distal bowel loops, which appear of normal caliber.  No overt evidence for free intraperitoneal air on the decubitus view. Pubic symphysis and lower lumbar DJD.  IMPRESSION: Gastric and proximal small bowel gaseous distension for which an early or partial bowel obstruction is not excluded and can be further evaluated with CT.  Prominent cardiac contour may reflect cardiomegaly and/or pericardial effusion.  Right infrahilar opacity may reflect atelectasis, aspiration, or pneumonia.   Original Report Authenticated By: Jearld Lesch, M.D.     Date: 11/11/2012  Rate: 110  Rhythm: atrial  fibrillation and premature ventricular contractions (PVC)  QRS Axis: normal  Intervals: afib  ST/T Wave abnormalities: normal  Conduction Disutrbances:afib  Narrative Interpretation:   Old EKG Reviewed: unchanged    1. SBO (small bowel obstruction)   2. Chronic diastolic heart failure   3. Atrial fibrillation       MDM  79 yo female with n/v, and probable presentation of repeat SBO.  Pt in afib with slightly high rate,  has history of same.  No signs of worsening CHF.  XRAY with proximal small bowel air fluid levels.  Pt is amenable to NGT tube placement which along with enemas has helped in the past.  D/w Dr Conley Rolls on call for [facility] who is comfortable with holding on CT scan for now and following exam.  Plan for admission.       Olivia Mackie, MD 11/11/12 [phone]

## 2012-11-11 NOTE — Progress Notes (Signed)
Utilization Review Completed Shain Pauwels J. Avant Printy, RN, BSN, NCM [phone]  

## 2012-11-11 NOTE — ED Notes (Signed)
Pt. C/o N/V starting last night around 2130. Pt. Reports hx of bowel obstructions. LBM yesterday.

## 2012-11-11 NOTE — Plan of Care (Signed)
Problem: Phase I Progression Outcomes Goal: OOB as tolerated unless otherwise ordered Outcome: Completed/Met Date Met:  11/11/12 Pt OOB to [facility] with 1 asst, pt uses walker at home

## 2012-11-11 NOTE — Consult Note (Signed)
Patient seen and examined.  Will check CT to see if PSBO is secondary to tumor.  She does not want any more surgery.

## 2012-11-11 NOTE — ED Notes (Signed)
Add on Magnesium spoke with [name] in Lab regarding analysis

## 2012-11-11 NOTE — H&P (Signed)
[facility] History and Physical  [name] YQI:[id number] DOB: 1919-11-23 DOA: 11/11/2012  Referring physician: Dr Norlene Campbell PCP: [name], MD  Specialists: Cardiology: Dr Johney Frame  Chief Complaint: NAUSEA/ VOMMITTING  HPI: [name] is a 79 y.o. female with history of permanent A. fib on PRADAXA, diastolic CHF, type 2 diabetes, hypertension, hyperlipidemia, hypothyroidism, obstructive sleep apnea, history of breast, bladder, cervical and ovarian cancer as well as recurrent small bowel obstructions secondary to adhesions presents to the ED with a several hour history of nausea and emesis. Patient stated that she started having some nausea and emesis which began the night prior to admission at around 8:30 PM occurring every hour. Patient denies any hematemesis no melena no hematochezia. Patient denies any abdominal pain. Patient does endorse abdominal distention. Patient denies any fever, no chills, no chest pain, no shortness of breath, no cough, no diarrhea, no constipation. Patient states her last bowel movement was at 10:30 AM the day prior to admission. Patient denies any dysuria. Patient denies any visual deficits. Patient does endorse some generalized weakness. Patient was seen in the emergency room comprehensive metabolic profile done had a potassium of 3.4 otherwise was within normal limits. CBC done was unremarkable. EKG showed atrial fibrillation with RVR. Acute abdominal series which was done showed gastric and proximal small bowel gaseous distention worrisome for early or partial small bowel obstruction, prominent cardiac contour, right infrahilar opacity which may reflect atelectasis, aspiration, or pneumonia. NG tube has been ordered. We were called to admit the patient for further evaluation and management.  Review of Systems: The patient denies anorexia, fever, weight loss,, vision loss, decreased hearing, hoarseness, chest pain, syncope, dyspnea on exertion,  peripheral edema, balance deficits, hemoptysis, abdominal pain, melena, hematochezia, severe indigestion/heartburn, hematuria, incontinence, genital sores, muscle weakness, suspicious skin lesions, transient blindness, difficulty walking, depression, unusual weight change, abnormal bleeding, enlarged lymph nodes, angioedema, and breast masses.   Past Medical History  Diagnosis Date  . DM2 (diabetes mellitus, type 2)   . Hypertension   . SBO (small bowel obstruction)   . Colon cancer   . Permanent atrial fibrillation     a. coumadin d/c'd => Pradaxa in 05/2012  . Chronic diastolic heart failure     a. Echo 5/12: Mild LVH, EF 55-60%, mild AI, mild MR, severe LAE, mild RAE, PASP 31, small pericardial effusion  . Venous insufficiency     chronic LE edema  . HLD (hyperlipidemia)   . OSA (obstructive sleep apnea)   . Breast cancer   . Cervical cancer   . Ovarian cancer   . Bladder cancer   . Hypothyroidism   . Diabetes mellitus 11/11/2012  . Hx SBO 11/11/2012  . Diastolic CHF, chronic 11/11/2012    Class 2b-3 2 d echo 5/12 mild LVH EF 55-50%, MILD AI, MILD MR, SEVERE LAE, mild RAE, PASP 31, SMALL PERICRADIAL EFFUSION  . Hyperlipidemia 11/11/2012  . HX: breast cancer 11/11/2012    S/P L MASTECTOMY AND R BREAST LUMPECTOMY APPROX 35 YRS AGO  . Hx of cervical cancer 11/11/2012  . H/O ovarian cancer 11/11/2012     MUCINOUS CYST S/P RESECTION 35 YEARS AGO  . Hx of bladder cancer 11/11/2012  . S/P hysterectomy 11/11/2012   History reviewed. No pertinent past surgical history. Social History:  reports that she quit smoking about 69 years ago. She does not have any smokeless tobacco history on file. She reports that she does not drink alcohol or use illicit drugs.  Allergies  Allergen Reactions  . Codeine     unknown  . Morphine And Related     sick  . Statins     sick    History reviewed. No pertinent family history.   Prior to Admission medications   Medication Sig Start Date End Date  Taking? Authorizing Provider  dabigatran (PRADAXA) 150 MG CAPS Take 1 capsule (150 mg total) by mouth every 12 (twelve) hours. 05/23/12  Yes Hillis Range, MD  diltiazem (CARDIZEM CD) 360 MG 24 hr capsule Take 1 capsule (360 mg total) by mouth daily. 02/26/11  Yes Dolores Patty, MD  ferrous sulfate 325 (65 FE) MG tablet Take 325 mg by mouth 2 (two) times daily.   Yes Historical Provider, MD  furosemide (LASIX) 40 MG tablet Take 80 mg by mouth 2 (two) times daily.   Yes Historical Provider, MD  levothyroxine (SYNTHROID, LEVOTHROID) 200 MCG tablet Take 200 mcg by mouth every morning.     Yes Historical Provider, MD  potassium chloride SA (K-DUR,KLOR-CON) 20 MEQ tablet Take 1 tablet (20 mEq total) by mouth daily. 09/25/12  Yes Hillis Range, MD  ramipril (ALTACE) 10 MG capsule Take 10 mg by mouth every morning.     Yes Historical Provider, MD   Physical Exam: Filed Vitals:   11/11/12 0454 11/11/12 0835  BP: 161/102 143/70  Pulse: 116 115  Temp: 98 F (36.7 C)   TempSrc: Oral   Resp: 16 18  SpO2: 96% 93%     General:  Obese well-developed well-nourished in no acute cardiopulmonary distress.  Eyes: Pupils equal round and reactive to light and accommodation. Extraocular movements intact.  ENT: Oropharynx is clear, no lesions, no exudates. Dry mucous membranes.  Neck: Supple with no lymphadenopathy. No JVD.  Cardiovascular: Irregularly irregular  Respiratory: Clear to auscultation bilaterally.  Abdomen: Distended, soft, nontender, decrease positive bowel sounds, no rebound, no guarding.  Skin: No rashes or lesions.  Musculoskeletal: 4/5 BUE strength, 4/5 BLE strength  Psychiatric: Normal mood. Normal affect. Good insight. Good Judgment.  Neurologic: Alert and oriented x3. Cranial nerves II through XII are grossly intact. Sensation is intact. Visual fields are intact. No focal deficits. Gait not tested secondary to safety.  Labs on Admission:  Basic Metabolic Panel:  Recent  Labs Lab 11/11/12 0525  NA 143  K 3.4*  CL 103  CO2 27  GLUCOSE 156*  BUN 13  CREATININE 0.71  CALCIUM 9.6  MG 2.2   Liver Function Tests:  Recent Labs Lab 11/11/12 0525  AST 22  ALT 13  ALKPHOS 79  BILITOT 0.8  PROT 7.3  ALBUMIN 3.8   No results found for this basename: LIPASE, AMYLASE,  in the last 168 hours No results found for this basename: AMMONIA,  in the last 168 hours CBC:  Recent Labs Lab 11/11/12 0525  WBC 7.9  NEUTROABS 6.8  HGB 15.0  HCT 44.0  MCV 93.6  PLT 162   Cardiac Enzymes: No results found for this basename: CKTOTAL, CKMB, CKMBINDEX, TROPONINI,  in the last 168 hours  BNP (last 3 results) No results found for this basename: PROBNP,  in the last 8760 hours CBG: No results found for this basename: GLUCAP,  in the last 168 hours  Radiological Exams on Admission: Dg Abd Acute W/chest  11/11/2012  *RADIOLOGY REPORT*  Clinical Data: Nausea, vomiting.  ACUTE ABDOMEN SERIES (ABDOMEN 2 VIEW & CHEST 1 VIEW)  Comparison: 10/15/2012 chest radiograph and 07/25/2012 abdominopelvic CT  Findings: Right infrahilar consolidation.  Bihilar fullness. Cardiomegaly.  Aortic atherosclerosis.  Gaseous distension of stomach and a presumably proximal small bowel loops of bowel with air-fluid levels. There is air noted within the colon and distal bowel loops, which appear of normal caliber.  No overt evidence for free intraperitoneal air on the decubitus view. Pubic symphysis and lower lumbar DJD.  IMPRESSION: Gastric and proximal small bowel gaseous distension for which an early or partial bowel obstruction is not excluded and can be further evaluated with CT.  Prominent cardiac contour may reflect cardiomegaly and/or pericardial effusion.  Right infrahilar opacity may reflect atelectasis, aspiration, or pneumonia.   Original Report Authenticated By: Jearld Lesch, M.D.     EKG: Independently reviewed. A. fib  Assessment/Plan Principal Problem:   SBO (small bowel  obstruction) Active Problems:   Chronic diastolic heart failure   Atrial fibrillation   Hypokalemia   Nausea & vomiting   Diabetes mellitus   Hypothyroidism   Hx SBO   Colon cancer   Diastolic CHF, chronic   Venous insufficiency   Hyperlipidemia   OSA (obstructive sleep apnea)   HX: breast cancer   Hx of cervical cancer   H/O ovarian cancer   Hx of bladder cancer   PNA (pneumonia)  #1 early versus partial small bowel obstruction Patient has had a history of recurrent small bowel obstructions felt to be secondary to adhesions which were treated conservatively in the past. Patient is presenting with nausea vomiting over the past several hours. Acute abdominal series shows distention of gastric and small bowel were still full early or partial small bowel obstruction. Patient denies any abdominal pain. Patient does have a prior history of abdominal surgeries for ovarian cancer and is status post hysterectomy. Continue NG tube with low intermittent suction. N.p.o./bowel rest. Serial x-rays. Gentle IV hydration. Pain management. Supportive care. Will consult with general surgery for further evaluation and management.  #2 probable aspiration pneumonia Chest x-ray with right infrahilar opacity worrisome for atelectasis versus aspiration versus pneumonia. Patient with emesis over several hours. Patient is currently afebrile. CBC however does have a left shift. We'll place empirically on IV Zosyn. Follow.  #3 hypokalemia Replete.  #4 hypothyroidism Will place on IV Synthroid. Once tolerating oral intake we'll switch to oral Synthroid.  #5 A. fib with RVR Likely secondary to problem #1. Patient denies any chest pain or shortness of breath. We'll place on Lopressor IV 5 mg every 8 hours for rate control and titrate. Pradaxa on hold. Will place on full dose IV heparin.  #6 Diabetes mellitus type 2 Check a hemoglobin A1c. Check CBGs every 4 hours as patient is n.p.o. Sliding scale  insulin.  #7 hypertension IV Lopressor.  #8 obstructive sleep apnea  #9 hyperlipidemia  #10 prophylaxis Heparin for DVT prophylaxis.   Code Status: DNR Family Communication: updated patient and daughter at bedside. Disposition Plan: Admit to telemetry  Time spent: 97 MINS  [facility] Pager [phone]  If 7PM-7AM, please contact night-coverage [email or web address] Password [facility] 11/11/2012, 8:51 AM

## 2012-11-11 NOTE — ED Notes (Signed)
Per PTAR: pt from home, c/o n/v since 9:30 last night.  Pt has hx of small bowel obstruction.  Pt conscious and alert, VSS.

## 2012-11-11 NOTE — Progress Notes (Signed)
ANTICOAGULATION CONSULT NOTE - Follow Up Consult  Pharmacy Consult for UFH Indication: atrial fibrillation  Allergies  Allergen Reactions  . Codeine     unknown  . Morphine And Related     sick  . Statins     sick    Patient Measurements: Height: 5\' 2"  (157.5 cm) Weight: 217 lb (98.431 kg) (bed) IBW/kg (Calculated) : 50.1 Heparin Dosing Weight: 73kg  Vital Signs: Temp: 97.8 F (36.6 C) (05/13 2016) Temp src: Oral (05/13 2016) BP: 119/88 mmHg (05/13 2016) Pulse Rate: 114 (05/13 2016)  Labs:  Recent Labs  11/11/12 0525 11/11/12 0529 11/11/12 2019  HGB 15.0  --  14.4  HCT 44.0  --  42.2  PLT 162  --  164  LABPROT  --  14.6  --   INR  --  1.16  --   HEPARINUNFRC  --   --  0.10*  CREATININE 0.71  --  0.79    Estimated Creatinine Clearance: 51.2 ml/min (by C-G formula based on Cr of 0.79).   Medications:  Scheduled:  . insulin aspart  0-9 Units Subcutaneous TID WC  . [EXPIRED] iohexol  25 mL Oral Q1 Hr x 2  . levothyroxine  100 mcg Intravenous Daily  . [COMPLETED] lidocaine   Other Once  . metoprolol  5 mg Intravenous Q8H  . [COMPLETED] ondansetron (ZOFRAN) IV  4 mg Intravenous Once  . piperacillin-tazobactam (ZOSYN)  IV  3.375 g Intravenous Q8H  . [EXPIRED] potassium chloride  10 mEq Intravenous Q1 Hr x 5  . [COMPLETED] potassium chloride  10 mEq Intravenous Q1 Hr x 5  . [COMPLETED] sodium chloride  250 mL Intravenous Once  . sodium chloride  3 mL Intravenous Q12H    Assessment: 79 y/o female patient admitted with n/v and recurrent SBO, on pradaxa pta for h/o afib. Now receiving IV heparin while pradaxa is held. First heparin level is subtherapeutic, will increase rate.  Goal of Therapy:  Heparin level 0.3-0.7 units/ml Monitor platelets by anticoagulation protocol: Yes   Plan:  Increase heparin gtt to 1200 units/hr and check 8 hour heparin level.  Verlene Mayer, PharmD, BCPS Pager [phone] 11/11/2012,9:27 PM

## 2012-11-11 NOTE — Progress Notes (Signed)
ANTICOAGULATION AND ANTIBIOTIC CONSULT NOTE - Initial Consult  Pharmacy Consult for Heparin and Zosyn Indication: atrial fibrillation and aspiration PNA  Allergies  Allergen Reactions  . Codeine     unknown  . Morphine And Related     sick  . Statins     sick    Patient Measurements: Height: 5\' 2"  (157.5 cm) Weight: 217 lb (98.431 kg) (bed) IBW/kg (Calculated) : 50.1 Heparin Dosing Weight: 73 kg  Vital Signs: Temp: 98.9 F (37.2 C) (05/13 0950) Temp src: Oral (05/13 0950) BP: 132/73 mmHg (05/13 0950) Pulse Rate: 92 (05/13 0950)  Labs:  Recent Labs  11/11/12 0525 11/11/12 0529  HGB 15.0  --   HCT 44.0  --   PLT 162  --   LABPROT  --  14.6  INR  --  1.16  CREATININE 0.71  --     Estimated Creatinine Clearance: 51.2 ml/min (by C-G formula based on Cr of 0.71).   Medical History: Past Medical History  Diagnosis Date  . DM2 (diabetes mellitus, type 2)   . Hypertension   . SBO (small bowel obstruction)   . Colon cancer   . Permanent atrial fibrillation     a. coumadin d/c'd => Pradaxa in 05/2012  . Chronic diastolic heart failure     a. Echo 5/12: Mild LVH, EF 55-60%, mild AI, mild MR, severe LAE, mild RAE, PASP 31, small pericardial effusion  . Venous insufficiency     chronic LE edema  . HLD (hyperlipidemia)   . OSA (obstructive sleep apnea)   . Breast cancer   . Cervical cancer   . Ovarian cancer   . Bladder cancer   . Hypothyroidism   . Diabetes mellitus 11/11/2012  . Hx SBO 11/11/2012  . Diastolic CHF, chronic 11/11/2012    Class 2b-3 2 d echo 5/12 mild LVH EF 55-50%, MILD AI, MILD MR, SEVERE LAE, mild RAE, PASP 31, SMALL PERICRADIAL EFFUSION  . Hyperlipidemia 11/11/2012  . HX: breast cancer 11/11/2012    S/P L MASTECTOMY AND R BREAST LUMPECTOMY APPROX 35 YRS AGO  . Hx of cervical cancer 11/11/2012  . H/O ovarian cancer 11/11/2012     MUCINOUS CYST S/P RESECTION 35 YEARS AGO  . Hx of bladder cancer 11/11/2012  . S/P hysterectomy 11/11/2012     Medications:  Prescriptions prior to admission  Medication Sig Dispense Refill  . dabigatran (PRADAXA) 150 MG CAPS Take 1 capsule (150 mg total) by mouth every 12 (twelve) hours.      Marland Kitchen diltiazem (CARDIZEM CD) 360 MG 24 hr capsule Take 1 capsule (360 mg total) by mouth daily.      . ferrous sulfate 325 (65 FE) MG tablet Take 325 mg by mouth 2 (two) times daily.      . furosemide (LASIX) 40 MG tablet Take 80 mg by mouth 2 (two) times daily.      Marland Kitchen levothyroxine (SYNTHROID, LEVOTHROID) 200 MCG tablet Take 200 mcg by mouth every morning.        . potassium chloride SA (K-DUR,KLOR-CON) 20 MEQ tablet Take 1 tablet (20 mEq total) by mouth daily.  90 tablet  3  . ramipril (ALTACE) 10 MG capsule Take 10 mg by mouth every morning.          Assessment: 79 y/o female who presented to the ED with N/V and hx recurrent SBO. Pharmacy consulted to begin IV heparin for Afib while Pradaxa is on hold. Last dose of Pradaxa was 5/12 at 20:45. Patient  vomited > 1 hr after taking Pradaxa dose and is unsure if the pill came up. Will treat conservatively and assume Pradaxa is on board. CBC is normal at baseline. INR is 1.16.  Pharmacy also consulted to begin Zosyn for aspiration PNA. Reanl function is normal at baseline. Patient is afebrile, WBC are normal, and urine culture is pending.  Goal of Therapy:  Heparin level 0.3-0.7 Monitor platelets by anticoagulation protocol: Yes Resolution of PNA   Plan:  -Begin heparin drip at 1000 units/hr with no bolus -Heparin level 8 hours after started -Daily heparin level and CBC while on heparin -Zosyn 3.375 g IV q8h to be infused over 4 hours -Monitor renal function and culture data

## 2012-11-11 NOTE — Consult Note (Signed)
[name] 10/02/1919  [id number].   Requesting MD: Dr. Ramiro Harvest Chief Complaint/Reason for Consult: SBO HPI: This is a 79 yo white female with multiple medical problems who has had multiple abdominal cancers including bladder, cervical, and ovarian.  She has also had breast cancer and underwent several operations for all of these malignancies.  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  This can NOT be confirmed.  Because of these multiple surgeries, she states she has had 12 prior bowel obstructions, but said they always get better without any surgeries.    Last night she started to develop nausea and vomiting, but no abdominal pain.  She states the last time she passed flatus or had a BM was yesterday.  She came to the [facility] because she knew she has another SBO.  Upon arrival she had a plain abdominal film that showed some gastric distention and very proximal SB dilatation, but maybe only the first part of the duodenum.  This was read and maybe early PSBO.  The patient was admitted and an NGT was placed.  We have been consulted for further evaluation.  Review of Systems: Please see HPI, otherwise all other systems are negative.  History reviewed. No pertinent family history.  Past Medical History  Diagnosis Date  . DM2 (diabetes mellitus, type 2)   . Hypertension   . SBO (small bowel obstruction)   . Colon cancer   . Permanent atrial fibrillation     a. coumadin d/c'd => Pradaxa in 05/2012  . Chronic diastolic heart failure     a. Echo 5/12: Mild LVH, EF 55-60%, mild AI, mild MR, severe LAE, mild RAE, PASP 31, small pericardial effusion  . Venous insufficiency     chronic LE edema  . HLD (hyperlipidemia)   . OSA (obstructive sleep apnea)   . Breast cancer   . Cervical cancer   . Ovarian cancer   . Bladder cancer   . Hypothyroidism   . Diabetes mellitus 11/11/2012  . Hx SBO 11/11/2012   . Diastolic CHF, chronic 11/11/2012    Class 2b-3 2 d echo 5/12 mild LVH EF 55-50%, MILD AI, MILD MR, SEVERE LAE, mild RAE, PASP 31, SMALL PERICRADIAL EFFUSION  . Hyperlipidemia 11/11/2012  . HX: breast cancer 11/11/2012    S/P L MASTECTOMY AND R BREAST LUMPECTOMY APPROX 35 YRS AGO  . Hx of cervical cancer 11/11/2012  . H/O ovarian cancer 11/11/2012     MUCINOUS CYST S/P RESECTION 35 YEARS AGO  . Hx of bladder cancer 11/11/2012  . S/P hysterectomy 11/11/2012  . Small bowel obstruction 11/11/2012    Past Surgical History  Procedure Laterality Date  . Abdominal hysterectomy    . Mastectomy, radical Left   . Breast lumpectomy Right   . Bladder surgery      Social History:  reports that she quit smoking about 46 years ago. She has never used smokeless tobacco. She reports that she does not drink alcohol or use illicit drugs.  Allergies:  Allergies  Allergen Reactions  . Codeine     unknown  . Morphine And Related     sick  . Statins     sick    Medications Prior to Admission  Medication Sig Dispense Refill  . dabigatran (PRADAXA) 150 MG CAPS Take 1 capsule (150 mg total) by mouth every 12 (twelve) hours.      Marland Kitchen diltiazem (CARDIZEM CD) 360  MG 24 hr capsule Take 1 capsule (360 mg total) by mouth daily.      . ferrous sulfate 325 (65 FE) MG tablet Take 325 mg by mouth 2 (two) times daily.      . furosemide (LASIX) 40 MG tablet Take 80 mg by mouth 2 (two) times daily.      Marland Kitchen levothyroxine (SYNTHROID, LEVOTHROID) 200 MCG tablet Take 200 mcg by mouth every morning.        . potassium chloride SA (K-DUR,KLOR-CON) 20 MEQ tablet Take 1 tablet (20 mEq total) by mouth daily.  90 tablet  3  . ramipril (ALTACE) 10 MG capsule Take 10 mg by mouth every morning.          Blood pressure 132/73, pulse 92, temperature 98.9 F (37.2 C), temperature source Oral, resp. rate 18, height 5\' 2"  (1.575 m), weight 217 lb (98.431 kg), SpO2 94.00%. Physical Exam: General: pleasant, WD, WN white female who is  laying in bed in NAD HEENT: head is normocephalic, atraumatic.  Sclera are noninjected.  PERRL.  Ears and nose without any masses or lesions.  Mouth is pink.  NGT with minimal bilious output Heart: irregularly irregularly.  Normal s1,s2. No obvious murmurs, gallops, or rubs noted.  Palpable radial and pedal pulses bilaterally Lungs: CTAB, no wheezes, rhonchi, or rales noted.  Respiratory effort nonlabored Abd: soft, NT, ND, obese, +BS, no masses, hernias, or organomegaly, except for a very small reducible umbilical hernia.  She has a faded old laparotomy scar noted MS: all 4 extremities are symmetrical with no cyanosis, clubbing, or edema.  She does have venous stasis changes Skin: warm and dry with no masses, lesions, or rashes Psych: A&Ox3 with an appropriate affect.    Results for orders placed during the hospital encounter of 11/11/12 (from the past 48 hour(s))  CBC WITH DIFFERENTIAL     Status: Abnormal   Collection Time    11/11/12  5:25 AM      Result Value Range   WBC 7.9  4.0 - 10.5 K/uL   RBC 4.70  3.87 - 5.11 MIL/uL   Hemoglobin 15.0  12.0 - 15.0 g/dL   HCT 40.9  81.1 - 91.4 %   MCV 93.6  78.0 - 100.0 fL   MCH 31.9  26.0 - 34.0 pg   MCHC 34.1  30.0 - 36.0 g/dL   RDW 78.2 (*) 95.6 - 21.3 %   Platelets 162  150 - 400 K/uL   Neutrophils Relative 85 (*) 43 - 77 %   Neutro Abs 6.8  1.7 - 7.7 K/uL   Lymphocytes Relative 10 (*) 12 - 46 %   Lymphs Abs 0.8  0.7 - 4.0 K/uL   Monocytes Relative 5  3 - 12 %   Monocytes Absolute 0.4  0.1 - 1.0 K/uL   Eosinophils Relative 0  0 - 5 %   Eosinophils Absolute 0.0  0.0 - 0.7 K/uL   Basophils Relative 0  0 - 1 %   Basophils Absolute 0.0  0.0 - 0.1 K/uL  COMPREHENSIVE METABOLIC PANEL     Status: Abnormal   Collection Time    11/11/12  5:25 AM      Result Value Range   Sodium 143  135 - 145 mEq/L   Potassium 3.4 (*) 3.5 - 5.1 mEq/L   Chloride 103  96 - 112 mEq/L   CO2 27  19 - 32 mEq/L   Glucose, Bld 156 (*) 70 - 99 mg/dL  BUN 13  6  - 23 mg/dL   Creatinine, Ser 1.61  0.50 - 1.10 mg/dL   Calcium 9.6  8.4 - 09.6 mg/dL   Total Protein 7.3  6.0 - 8.3 g/dL   Albumin 3.8  3.5 - 5.2 g/dL   AST 22  0 - 37 U/L   ALT 13  0 - 35 U/L   Alkaline Phosphatase 79  39 - 117 U/L   Total Bilirubin 0.8  0.3 - 1.2 mg/dL   GFR calc non Af Amer 74 (*) >90 mL/min   GFR calc Af Amer 85 (*) >90 mL/min   Comment:            The eGFR has been calculated     using the CKD EPI equation.     This calculation has not been     validated in all clinical     situations.     eGFR's persistently     <90 mL/min signify     possible Chronic Kidney Disease.  MAGNESIUM     Status: None   Collection Time    11/11/12  5:25 AM      Result Value Range   Magnesium 2.2  1.5 - 2.5 mg/dL  PROTIME-INR     Status: None   Collection Time    11/11/12  5:29 AM      Result Value Range   Prothrombin Time 14.6  11.6 - 15.2 seconds   INR 1.16  0.00 - 1.49   Dg Abd Acute W/chest  11/11/2012  *RADIOLOGY REPORT*  Clinical Data: Nausea, vomiting.  ACUTE ABDOMEN SERIES (ABDOMEN 2 VIEW & CHEST 1 VIEW)  Comparison: 10/15/2012 chest radiograph and 07/25/2012 abdominopelvic CT  Findings: Right infrahilar consolidation.  Bihilar fullness. Cardiomegaly.  Aortic atherosclerosis.  Gaseous distension of stomach and a presumably proximal small bowel loops of bowel with air-fluid levels. There is air noted within the colon and distal bowel loops, which appear of normal caliber.  No overt evidence for free intraperitoneal air on the decubitus view. Pubic symphysis and lower lumbar DJD.  IMPRESSION: Gastric and proximal small bowel gaseous distension for which an early or partial bowel obstruction is not excluded and can be further evaluated with CT.  Prominent cardiac contour may reflect cardiomegaly and/or pericardial effusion.  Right infrahilar opacity may reflect atelectasis, aspiration, or pneumonia.   Original Report Authenticated By: Jearld Lesch, M.D.         Assessment/Plan 1. Nausea/vomiting 2. Possible early partial small bowel obstruction Patient Active Problem List   Diagnosis Date Noted  . Nausea & vomiting 11/11/2012  . Diabetes mellitus 11/11/2012  . Hypothyroidism 11/11/2012  . Hx SBO 11/11/2012  . Colon cancer 11/11/2012  . Diastolic CHF, chronic 11/11/2012  . Venous insufficiency 11/11/2012  . Hyperlipidemia 11/11/2012  . OSA (obstructive sleep apnea) 11/11/2012  . HX: breast cancer 11/11/2012  . Hx of cervical cancer 11/11/2012  . H/O ovarian cancer 11/11/2012  . Hx of bladder cancer 11/11/2012  . S/P hysterectomy 11/11/2012  . PNA (pneumonia) 11/11/2012  . Hypokalemia 12/09/2011  . SBO (small bowel obstruction) 12/06/2011  . Chronic diastolic heart failure 02/17/2011  . Atrial fibrillation 02/17/2011   Plan: 1. The patient's plan abdominal films show some proximal dilatation of the stomach and the duodenum.  It is a little odd to have a bowel obstruction secondary to adhesive disease this proximal.  She has not had any evidence of metastatic disease on a CT 4 months ago or a  recent endoscopy to suggest any other disease process though, other than adhesive disease.  To further evaluate this, we will obtain a CT scan.  The patient refuses any type of surgery even if required due to her age.  Right now, conservative management with an NGT and bowel rest is appropriate.  We will follow along.  This was d/w the patient and her daughter, who was present and is a retired IV Engineer, civil (consulting) here at [employer].    Torryn Fiske E 11/11/2012, 11:09 AM Pager: [phone]

## 2012-11-11 NOTE — Progress Notes (Signed)
Peripherally Inserted Central Catheter/Midline Placement  The IV Nurse has discussed with the patient and/or persons authorized to consent for the patient, the purpose of this procedure and the potential benefits and risks involved with this procedure.  The benefits include less needle sticks, lab draws from the catheter and patient may be discharged home with the catheter.  Risks include, but not limited to, infection, bleeding, blood clot (thrombus formation), and puncture of an artery; nerve damage and irregular heat beat.  Alternatives to this procedure were also discussed.  PICC/Midline Placement Documentation        [name] 11/11/2012, 12:01 PM

## 2012-11-12 ENCOUNTER — Inpatient Hospital Stay (HOSPITAL_COMMUNITY): Payer: Medicare Other

## 2012-11-12 DIAGNOSIS — Q433 Congenital malformations of intestinal fixation: Secondary | ICD-10-CM | POA: Diagnosis not present

## 2012-11-12 DIAGNOSIS — K56609 Unspecified intestinal obstruction, unspecified as to partial versus complete obstruction: Secondary | ICD-10-CM | POA: Diagnosis not present

## 2012-11-12 LAB — GLUCOSE, CAPILLARY
Glucose-Capillary: 102 mg/dL — ABNORMAL HIGH (ref 70–99)
Glucose-Capillary: 96 mg/dL (ref 70–99)
Glucose-Capillary: 129 mg/dL — ABNORMAL HIGH (ref 70–99)

## 2012-11-12 LAB — CBC
HCT: 44.3 % (ref 36.0–46.0)
MCV: 95.9 fL (ref 78.0–100.0)
Platelets: 184 10*3/uL (ref 150–400)
RBC: 4.62 MIL/uL (ref 3.87–5.11)
WBC: 7.5 10*3/uL (ref 4.0–10.5)

## 2012-11-12 LAB — URINE CULTURE: Colony Count: 50000

## 2012-11-12 LAB — BASIC METABOLIC PANEL
CO2: 22 mEq/L (ref 19–32)
Chloride: 107 mEq/L (ref 96–112)
Potassium: 4.3 mEq/L (ref 3.5–5.1)
Sodium: 139 mEq/L (ref 135–145)

## 2012-11-12 MED ORDER — METOPROLOL TARTRATE 1 MG/ML IV SOLN: 5.0000 mg | Freq: Four times a day (QID) | INTRAVENOUS | Status: DC | PRN

## 2012-11-12 MED ORDER — HEPARIN (PORCINE) IN NACL 100-0.45 UNIT/ML-% IJ SOLN
1600.0000 [IU]/h | INTRAMUSCULAR | Status: DC
  Administered 2012-11-12 – 2012-11-14 (×4): 1600 [IU]/h via INTRAVENOUS
  Filled 2012-11-12 (×5): qty 250

## 2012-11-12 MED ORDER — DILTIAZEM HCL ER COATED BEADS 180 MG PO CP24: 180.0000 mg | ORAL_CAPSULE | Freq: Every day | ORAL | Status: DC

## 2012-11-12 MED ORDER — METOPROLOL TARTRATE 1 MG/ML IV SOLN
5.0000 mg | Freq: Four times a day (QID) | INTRAVENOUS | Status: DC
  Administered 2012-11-12 (×4): 5 mg via INTRAVENOUS
  Filled 2012-11-12 (×7): qty 5

## 2012-11-12 NOTE — Progress Notes (Signed)
ANTICOAGULATION CONSULT NOTE - Follow Up Consult  Pharmacy Consult for UFH Indication: atrial fibrillation  Allergies  Allergen Reactions  . Codeine     unknown  . Morphine And Related     sick  . Statins     sick    Patient Measurements: Height: 5\' 2"  (157.5 cm) Weight: 221 lb 3.2 oz (100.336 kg) (a scale; rn notified of difference; rn in room) IBW/kg (Calculated) : 50.1 Heparin Dosing Weight: 73kg  Vital Signs: Temp: 98 F (36.7 C) (05/14 1300) Temp src: Oral (05/14 1300) BP: 135/77 mmHg (05/14 1300) Pulse Rate: 126 (05/14 1300)  Labs:  Recent Labs  11/11/12 0525 11/11/12 0529 11/11/12 2019 11/12/12 0500 11/12/12 1339  HGB 15.0  --  14.4 14.7  --   HCT 44.0  --  42.2 44.3  --   PLT 162  --  164 184  --   LABPROT  --  14.6  --   --   --   INR  --  1.16  --   --   --   HEPARINUNFRC  --   --  0.10* <0.10* 0.28*  CREATININE 0.71  --  0.79 0.78  --     Estimated Creatinine Clearance: 51.8 ml/min (by C-G formula based on Cr of 0.78).   Medications:  Scheduled:  . insulin aspart  0-9 Units Subcutaneous TID WC  . levothyroxine  100 mcg Intravenous Daily  . metoprolol  5 mg Intravenous Q6H  . piperacillin-tazobactam (ZOSYN)  IV  3.375 g Intravenous Q8H  . sodium chloride  3 mL Intravenous Q12H    Assessment: 79 y/o female patient admitted with n/v and recurrent SBO, on pradaxa pta for h/o afib. Now receiving IV heparin while pradaxa is held. Heparin just below goal at 0.28 on 1500 units/hr. No problem with line / infusion and no bleeding per RN. Will adjust rate to 1600 units/hr which should put patient in therapeutic range.   Patient is declining any surgical interventions at this time.  Goal of Therapy:  Heparin level 0.3-0.7 units/ml Monitor platelets by anticoagulation protocol: Yes   Plan:  1. Increase IV heparin to 1600 units/hr.  2. Daily HL/CBC 3. Follow up restart of pradaxa  Sheppard Coil PharmD., BCPS Clinical Pharmacist Pager  [phone] 11/12/2012 4:10 PM

## 2012-11-12 NOTE — Progress Notes (Signed)
Agree with findings and plan as outlined Violeta Gelinas, MD, MPH, FACS Pager: [phone]

## 2012-11-12 NOTE — Progress Notes (Signed)
Pt found with NG tube out of nose on floor. Pt not nauseous. Output this shift has been 0 to minimal. Pt states she would like to not have an NG tube. MD notified. Will continue to monitor and assess. Baron Hamper, RN 11/12/2012 5:39 AM

## 2012-11-12 NOTE — Evaluation (Signed)
Physical Therapy Evaluation Patient Details Name: [name] MRN: [id number] DOB: Nov 08, 1919 Today's Date: 11/12/2012 Time: 8295-6213 PT Time Calculation (min): 21 min  PT Assessment / Plan / Recommendation Clinical Impression  Pt 79 yo female admitted for n/v, found to have SBO. Pt functioning near baseline however presenting with generalized weakness and decreased activity tolerance. ANticipate pt to be safe for d/c home however would benefit from 24/7 supervision/assist initial upon d/c home to assure safety at home prior to pt being home alone during the day. Pt to benefit from HHPT to maximize funcitonal independence in home.    PT Assessment  Patient needs continued PT services    Follow Up Recommendations  Home health PT;Supervision/Assistance - 24 hour    Does the patient have the potential to tolerate intense rehabilitation      Barriers to Discharge Decreased caregiver support dtr works during the day    Equipment Recommendations  None recommended by PT    Recommendations for Other Services     Frequency Min 3X/week    Precautions / Restrictions Precautions Precautions: Fall Restrictions Weight Bearing Restrictions: No   Pertinent Vitals/Pain Pt with report of abdominal pain but did not rate      Mobility  Bed Mobility Bed Mobility: Supine to Sit;Sitting - Scoot to Edge of Bed Supine to Sit: 3: Mod assist;With rails;HOB flat Sitting - Scoot to Edge of Bed: 4: Min assist Details for Bed Mobility Assistance: pt reports she uses rod iron bed rail at [facility] at home Transfers Transfers: Sit to Stand;Stand to Sit Sit to Stand: 4: Min guard;With upper extremity assist;From bed Stand to Sit: 4: Min guard;With upper extremity assist;With armrests;To chair/3-in-1 Details for Transfer Assistance: increased time, good hand placement Ambulation/Gait Ambulation/Gait Assistance: 4: Min guard Ambulation Distance (Feet): 100 Feet Assistive device: Rolling  walker Ambulation/Gait Assistance Details: min v/c's to stay in walker, no episodes of LOB, mild SOB Gait Pattern: Step-through pattern;Decreased stride length;Wide base of support Gait velocity: wfl for age Stairs: No    Exercises     PT Diagnosis: Difficulty walking;Generalized weakness  PT Problem List: Decreased strength;Decreased activity tolerance PT Treatment Interventions: Gait training;Functional mobility training;Therapeutic activities;Therapeutic exercise   PT Goals Acute Rehab PT Goals PT Goal Formulation: With patient Time For Goal Achievement: 11/26/12 Potential to Achieve Goals: Good Pt will go Supine/Side to Sit: with modified independence;with HOB 0 degrees PT Goal: Supine/Side to Sit - Progress: Goal set today Pt will go Sit to Stand: with modified independence;with upper extremity assist (up to RW) PT Goal: Sit to Stand - Progress: Goal set today Pt will Ambulate: >150 feet;with modified independence;with rolling walker PT Goal: Ambulate - Progress: Goal set today Pt will Perform Home Exercise Program: Independently PT Goal: Perform Home Exercise Program - Progress: Goal set today  Visit Information  Last PT Received On: 11/12/12 Assistance Needed: +1    Subjective Data  Subjective: Pt recieved in bed, agreeable to PT "I am never getting back in the bed after I get up"   Prior Functioning  Home Living Lives With: Family;Daughter Available Help at Discharge: Family;Available PRN/intermittently (dtr works during the day) Type of Home: House Home Access: Level entry Home Layout: Able to live on main level with bedroom/bathroom Bathroom Shower/Tub: Health visitor: Handicapped height (3in1 over toilet) Bathroom Accessibility: Yes How Accessible: Accessible via walker Home Adaptive Equipment: Grab bars in shower;Bedside commode/3-in-1;Walker - rolling;Shower chair with back Additional Comments: pt uses shower chair Prior Function Level of  Independence: Independent with assistive device(s) Able to Take Stairs?: No Driving: No Vocation: Part time employment Comments: pt uses rolling walker with tray. limited community mobility Communication Communication: No difficulties (wears glasses) Dominant Hand: Right    Cognition  Cognition Arousal/Alertness: Awake/alert Behavior During Therapy: WFL for tasks assessed/performed Overall Cognitive Status: Within Functional Limits for tasks assessed    Extremity/Trunk Assessment Right Upper Extremity Assessment RUE ROM/Strength/Tone: Deficits RUE ROM/Strength/Tone Deficits: generalized weakness - grossly 4-/5 Left Upper Extremity Assessment LUE ROM/Strength/Tone: Deficits LUE ROM/Strength/Tone Deficits: generalized weakness - grossly 4-/5 Right Lower Extremity Assessment RLE ROM/Strength/Tone: Deficits RLE ROM/Strength/Tone Deficits: generalized weakness - grossly 4-/5 RLE Sensation: History of peripheral neuropathy Left Lower Extremity Assessment LLE ROM/Strength/Tone: Deficits LLE ROM/Strength/Tone Deficits: generalized weakness - grossly 4-/5 LLE Sensation: History of peripheral neuropathy Trunk Assessment Trunk Assessment: Kyphotic   Balance Balance Balance Assessed: Yes Static Sitting Balance Static Sitting - Balance Support: No upper extremity supported Static Sitting - Level of Assistance: 5: Stand by assistance Static Sitting - Comment/# of Minutes: 5 min Dynamic Standing Balance Dynamic Standing - Balance Support: Bilateral upper extremity supported Dynamic Standing - Level of Assistance: 5: Stand by assistance Dynamic Standing - Balance Activities:  (marching in place) Dynamic Standing - Comments: required use of RW to maintain balance  End of Session PT - End of Session Equipment Utilized During Treatment: Gait belt Activity Tolerance: Patient limited by fatigue Patient left: in chair;with call bell/phone within reach Nurse Communication: Mobility status   GP     Marcene Brawn 11/12/2012, 9:56 AM  Lewis Shock, PT, DPT Pager #: [phone] Office #: [phone]

## 2012-11-12 NOTE — Progress Notes (Signed)
Patient ID: [name], female   DOB: 16-Feb-1920, 79 y.o.   MRN: [id number]    Subjective: Pt feels better this morning.  No nausea.  NGT fell out.  Objective: Vital signs in last 24 hours: Temp:  [97.8 F (36.6 C)-98.9 F (37.2 C)] 97.8 F (36.6 C) (05/14 0502) Pulse Rate:  [86-137] 137 (05/14 0502) Resp:  [17-22] 22 (05/14 0502) BP: (109-152)/(51-98) 152/96 mmHg (05/14 0502) SpO2:  [92 %-96 %] 92 % (05/14 0502) Weight:  [217 lb (98.431 kg)-221 lb 3.2 oz (100.336 kg)] 221 lb 3.2 oz (100.336 kg) (05/14 0502) Last BM Date: 11/11/12  Intake/Output from previous day: 05/13 0701 - 05/14 0700 In: 1018.9 [I.V.:618.9; NG/GT:300; IV Piggyback:100] Out: 950 [Urine:650; Emesis/NG output:300] Intake/Output this shift:    PE: Abd: soft, NT, ND, +BS  Lab Results:   Recent Labs  11/11/12 2019 11/12/12 0500  WBC 7.7 7.5  HGB 14.4 14.7  HCT 42.2 44.3  PLT 164 184   BMET  Recent Labs  11/11/12 0525 11/11/12 2019 11/12/12 0500  NA 143  --  139  K 3.4*  --  4.3  CL 103  --  107  CO2 27  --  22  GLUCOSE 156*  --  113*  BUN 13  --  11  CREATININE 0.71 0.79 0.78  CALCIUM 9.6  --  8.7   PT/INR  Recent Labs  11/11/12 0529  LABPROT 14.6  INR 1.16   CMP     Component Value Date/Time   NA 139 11/12/2012 0500   K 4.3 11/12/2012 0500   CL 107 11/12/2012 0500   CO2 22 11/12/2012 0500   GLUCOSE 113* 11/12/2012 0500   BUN 11 11/12/2012 0500   CREATININE 0.78 11/12/2012 0500   CALCIUM 8.7 11/12/2012 0500   PROT 7.3 11/11/2012 0525   ALBUMIN 3.8 11/11/2012 0525   AST 22 11/11/2012 0525   ALT 13 11/11/2012 0525   ALKPHOS 79 11/11/2012 0525   BILITOT 0.8 11/11/2012 0525   GFRNONAA 71* 11/12/2012 0500   GFRAA 83* 11/12/2012 0500   Lipase     Component Value Date/Time   LIPASE 33 11/20/2011 1157       Studies/Results: Ct Abdomen Pelvis W Contrast  11/11/2012   *RADIOLOGY REPORT*  Clinical Data: Evaluate small bowel obstruction.  Nausea, vomiting.  CT ABDOMEN AND PELVIS WITH  CONTRAST  Technique:  Multidetector CT imaging of the abdomen and pelvis was performed following the standard protocol during bolus administration of intravenous contrast.  Contrast: OMNIPAQUE IOHEXOL 300 MG/ML  SOLN  Comparison: Plain films 11/11/2012.  CT 07/25/2012.  Findings: There is cardiomegaly.  Small pericardial effusion and right pleural effusion.  No confluent airspace opacities in the lung bases.  NG tube is in place with the tip in the proximal stomach. Decreasing distention of the stomach and proximal small bowel. There appears to be partial malrotation of the small bowel which never fully crosses the midline.  The distal duodenum and proximal jejunum are mildly prominent, but improved since prior study.  I see no obstructing process.  Gradual tapering of the small bowel to normal caliber small bowel distally.  Contrast has passed into the colon.  Mildly nodular contours of the liver suggest cirrhosis.  No focal abnormality.  Spleen, pancreas, adrenals are unremarkable.  Small low-density lesions in the kidneys bilaterally compatible with small cysts.  There is a fatty lesion in the mid pole of the left kidney which measures 14 mm and is compatible  with an angiomyolipoma.  Trace free fluid adjacent to the liver.  Urinary bladder is unremarkable.  Prior hysterectomy.  No adnexal masses.  No free air or adenopathy.  Aorta is normal caliber.  IMPRESSION:  Partial small bowel malrotation.  The duodenum never fully crosses the midline.  There is mild prominence of the distal duodenum and proximal jejunal small bowel loops which have improved since prior study.  Contrast has passed into distal small bowel and colon.  Small pericardial and right pleural effusion.   Original Report Authenticated By: Charlett Nose, M.D.   Dg Chest Port 1 View  11/11/2012   *RADIOLOGY REPORT*  Clinical Data: PICC line placement.  PORTABLE CHEST - 1 VIEW  Comparison: 10/15/2012  Findings: Right upper extremity PICC line is  in place with the catheter tip in the lower SVC at the cavoatrial junction.  A nasogastric tube extends below the diaphragm.  Heart is moderately enlarged.  There is atelectasis at both lung bases.  No overt edema.  IMPRESSION: PICC line tip lies at the cavoatrial junction.   Original Report Authenticated By: Irish Lack, M.D.   Dg Abd Acute W/chest  11/11/2012   *RADIOLOGY REPORT*  Clinical Data: Nausea, vomiting.  ACUTE ABDOMEN SERIES (ABDOMEN 2 VIEW & CHEST 1 VIEW)  Comparison: 10/15/2012 chest radiograph and 07/25/2012 abdominopelvic CT  Findings: Right infrahilar consolidation.  Bihilar fullness. Cardiomegaly.  Aortic atherosclerosis.  Gaseous distension of stomach and a presumably proximal small bowel loops of bowel with air-fluid levels. There is air noted within the colon and distal bowel loops, which appear of normal caliber.  No overt evidence for free intraperitoneal air on the decubitus view. Pubic symphysis and lower lumbar DJD.  IMPRESSION: Gastric and proximal small bowel gaseous distension for which an early or partial bowel obstruction is not excluded and can be further evaluated with CT.  Prominent cardiac contour may reflect cardiomegaly and/or pericardial effusion.  Right infrahilar opacity may reflect atelectasis, aspiration, or pneumonia.   Original Report Authenticated By: Jearld Lesch, M.D.    Anti-infectives: Anti-infectives   Start     Dose/Rate Route Frequency Ordered Stop   11/11/12 1000  piperacillin-tazobactam (ZOSYN) IVPB 3.375 g     3.375 g 12.5 mL/hr over 240 Minutes Intravenous Every 8 hours 11/11/12 0923         Assessment/Plan  1. 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 H/o bowel malrotation  Plan: 1. Clear liquid diet and advance as tolerates. This finding has been d/w the patient as well as her Daughter, Thayer Ohm.  They both understand and are very grateful for the explanation.  This is likely what has been causing her last 12  admissions for PSBO.  The patient does not want any type of surgical intervention, which is not indicated at this time anyway.  The patient would just like to go home for her 89st b-day tomorrow.  No further surgical needs.  Call us back if any other issues.  Will sign off.   LOS: 1 day    Lovette Merta E 11/12/2012, 8:07 AM Pager: [phone]

## 2012-11-12 NOTE — Progress Notes (Signed)
Pt HR in atrial flutter 115-130 sustaining. Pt asymptomatic, resting in chair. Midnight dose of metoprolol IV 5mg  given to pt. Will continue to monitor pt. Baron Hamper, RN 11/12/2012 11:18 PM

## 2012-11-12 NOTE — Evaluation (Signed)
Occupational Therapy Evaluation Patient Details Name: [name] MRN: [id number] DOB: 1920-02-13 Today's Date: 11/12/2012 Time: 4098-1191 OT Time Calculation (min): 37 min  OT Assessment / Plan / Recommendation Clinical Impression  Pt is a 79 year old woman admitted with SBO.  Pt presents at a supervision min guard level in ADL and mobility due to generalized weakness and decreased endurance.  Began instruction in use of AE for LB ADL.  Will follow acutely to address standing ADL, shower transfer safety, and reinforce use of AE.  Anticipate pt will be able to return home with initial 24 hour care as she is very near her baseline.     OT Assessment  Patient needs continued OT Services    Follow Up Recommendations  Supervision/Assistance - 24 hour (initially)    Barriers to Discharge      Equipment Recommendations  None recommended by OT    Recommendations for Other Services    Frequency  Min 2X/week    Precautions / Restrictions Precautions Precautions: Fall   Pertinent Vitals/Pain No c/o pain    ADL  Eating/Feeding: Independent Where Assessed - Eating/Feeding: Chair Grooming: Brushing hair;Wash/dry hands;Supervision/safety Where Assessed - Grooming: Unsupported standing Upper Body Bathing: Set up Where Assessed - Upper Body Bathing: Unsupported sitting Lower Body Bathing: Min guard Where Assessed - Lower Body Bathing: Unsupported sitting;Supported sit to stand Upper Body Dressing: Set up Where Assessed - Upper Body Dressing: Unsupported sitting Lower Body Dressing: Min guard Where Assessed - Lower Body Dressing: Unsupported sitting;Supported sit to stand Toilet Transfer: Supervision/safety Toilet Transfer Method: Sit to Barista: Bedside commode Toileting - Architect and Hygiene: Supervision/safety Where Assessed - Engineer, mining and Hygiene: Sit to stand from 3-in-1 or toilet Equipment Used: Rolling walker;Sock  aid;Reacher Transfers/Ambulation Related to ADLs: supervision with RW to nurse's station and to bathroom ADL Comments: Pt performs LB ADL with adaptive technique at home.  Instructed pt in use of reacher, sock aid, and elastic shoe laces.    OT Diagnosis: Generalized weakness  OT Problem List: Impaired balance (sitting and/or standing);Decreased activity tolerance;Decreased safety awareness;Decreased knowledge of use of DME or AE;Obesity;Decreased strength OT Treatment Interventions: Self-care/ADL training;DME and/or AE instruction;Patient/family education   OT Goals Acute Rehab OT Goals OT Goal Formulation: With patient Time For Goal Achievement: 11/19/12 Potential to Achieve Goals: Good ADL Goals Pt Will Perform Grooming: with modified independence;Standing at sink (3 activities) ADL Goal: Grooming - Progress: Goal set today Pt Will Perform Lower Body Bathing: with modified independence;Sit to stand from chair;with adaptive equipment ADL Goal: Lower Body Bathing - Progress: Goal set today Pt Will Perform Lower Body Dressing: with modified independence;Sit to stand from chair;with adaptive equipment ADL Goal: Lower Body Dressing - Progress: Goal set today Pt Will Perform Tub/Shower Transfer: Shower transfer;with supervision;Ambulation;Grab bars;Shower seat with back ADL Goal: Tub/Shower Transfer - Progress: Goal set today  Visit Information  Last OT Received On: 11/12/12 Assistance Needed: +1    Subjective Data  Subjective: "I have been asking for a walker." Patient Stated Goal: Home with daughter, return to PLOF.   Prior Functioning     Home Living Lives With: Family;Daughter Available Help at Discharge: Family;Available PRN/intermittently Type of Home: House Home Access: Level entry Home Layout: Able to live on main level with bedroom/bathroom Bathroom Shower/Tub: Health visitor: Handicapped height Bathroom Accessibility: Yes How Accessible: Accessible  via walker Home Adaptive Equipment: Grab bars in shower;Bedside commode/3-in-1;Walker - rolling;Shower chair with back;Reacher Additional Comments: pt uses  shower chair Prior Function Level of Independence: Independent with assistive device(s) Able to Take Stairs?: No Driving: No Communication Communication: No difficulties Dominant Hand: Right         Vision/Perception Vision - History Baseline Vision: Wears glasses all the time Patient Visual Report: No change from baseline   Cognition  Cognition Arousal/Alertness: Awake/alert Behavior During Therapy: WFL for tasks assessed/performed Overall Cognitive Status: Within Functional Limits for tasks assessed    Extremity/Trunk Assessment Right Upper Extremity Assessment RUE ROM/Strength/Tone: WFL for tasks assessed RUE Coordination: WFL - gross/fine motor Left Upper Extremity Assessment LUE ROM/Strength/Tone: WFL for tasks assessed LUE Coordination: WFL - gross/fine motor Trunk Assessment Trunk Assessment: Kyphotic     Mobility Bed Mobility Bed Mobility: Not assessed Transfers Transfers: Sit to Stand;Stand to Sit Sit to Stand: 4: Min guard;With upper extremity assist;From chair/3-in-1 Stand to Sit: 4: Min guard;With upper extremity assist;To chair/3-in-1 Details for Transfer Assistance: increased time, good hand placement     Exercise     Balance Balance Balance Assessed: Yes Static Sitting Balance Static Sitting - Balance Support: No upper extremity supported Static Sitting - Level of Assistance: 5: Stand by assistance Static Standing Balance Static Standing - Balance Support: No upper extremity supported Static Standing - Level of Assistance: 5: Stand by assistance Static Standing - Comment/# of Minutes: 1 Dynamic Standing Balance Dynamic Standing - Balance Support: Left upper extremity supported;During functional activity Dynamic Standing - Level of Assistance: 5: Stand by assistance   End of Session OT -  End of Session Activity Tolerance: Patient tolerated treatment well Patient left: in chair;with call bell/phone within reach;with nursing in room Nurse Communication: Mobility status (pt needs walker, encourage ambulation)  GO     Evern Bio 11/12/2012, 1:50 PM [phone]

## 2012-11-12 NOTE — Progress Notes (Signed)
Pt HR sustaining 110-120. Pt Asymptomatic. BP 109/51. MD notified and new orders given. Will continue to monitor and assess pt. Baron Hamper, RN 11/12/2012 2:57 AM

## 2012-11-12 NOTE — Progress Notes (Signed)
ANTICOAGULATION CONSULT NOTE - Follow Up Consult  Pharmacy Consult for UFH Indication: atrial fibrillation  Allergies  Allergen Reactions  . Codeine     unknown  . Morphine And Related     sick  . Statins     sick    Patient Measurements: Height: 5\' 2"  (157.5 cm) Weight: 221 lb 3.2 oz (100.336 kg) (a scale; rn notified of difference; rn in room) IBW/kg (Calculated) : 50.1 Heparin Dosing Weight: 73kg  Vital Signs: Temp: 97.8 F (36.6 C) (05/14 0502) Temp src: Oral (05/14 0502) BP: 152/96 mmHg (05/14 0502) Pulse Rate: 137 (05/14 0502)  Labs:  Recent Labs  11/11/12 0525 11/11/12 0529 11/11/12 2019 11/12/12 0500  HGB 15.0  --  14.4 14.7  HCT 44.0  --  42.2 44.3  PLT 162  --  164 184  LABPROT  --  14.6  --   --   INR  --  1.16  --   --   HEPARINUNFRC  --   --  0.10* <0.10*  CREATININE 0.71  --  0.79  --     Estimated Creatinine Clearance: 51.8 ml/min (by C-G formula based on Cr of 0.79).   Medications:  Scheduled:  . insulin aspart  0-9 Units Subcutaneous TID WC  . levothyroxine  100 mcg Intravenous Daily  . metoprolol  5 mg Intravenous Q6H  . piperacillin-tazobactam (ZOSYN)  IV  3.375 g Intravenous Q8H  . sodium chloride  3 mL Intravenous Q12H    Assessment: 79 y/o female patient admitted with n/v and recurrent SBO, on pradaxa pta for h/o afib. Now receiving IV heparin while pradaxa is held. Heparin level (< 0.1) is below-goal on 1200 units/hr. No problem with line / infusion and no bleeding per RN.   Goal of Therapy:  Heparin level 0.3-0.7 units/ml Monitor platelets by anticoagulation protocol: Yes   Plan:  1. Increase IV heparin to 1500 units/hr.  2. Heparin level in 8 hours.   Lorre Munroe, PharmD 11/12/2012,6:37 AM

## 2012-11-12 NOTE — Progress Notes (Signed)
[facility] PROGRESS NOTE  [name] NWG:[id number] DOB: 1919-08-27 DOA: 11/11/2012 PCP: [name], MD  Assessment/Plan: Principal Problem:   SBO (small bowel obstruction) Active Problems:   Chronic diastolic heart failure   Atrial fibrillation   Hypokalemia   Nausea & vomiting   Diabetes mellitus   Hypothyroidism   Hx SBO   Colon cancer   Diastolic CHF, chronic   Venous insufficiency   Hyperlipidemia   OSA (obstructive sleep apnea)   HX: breast cancer   Hx of cervical cancer   H/O ovarian cancer   Hx of bladder cancer   PNA (pneumonia)      Early versus partial small bowel obstruction Clear liquid diet Advance as tolerated Does not want surgical intervention   #2 probable aspiration pneumonia  Chest x-ray with right infrahilar opacity worrisome for atelectasis versus aspiration versus pneumonia. Patient with emesis over several hours. Patient is currently afebrile. CBC however does have a left shift. We'll place empirically on IV Zosyn. Follow.   #3 hypokalemia  Replete.   #4 hypothyroidism  Will place on IV Synthroid. Once tolerating oral intake we'll switch to oral Synthroid.   #5 A. fib with RVR  Likely secondary to problem #1. Patient denies any chest pain or shortness of breath. We'll place on Lopressor IV 5 mg every 8 hours for rate control and titrate. Pradaxa on hold. Will place on full dose IV heparin.   #6 Diabetes mellitus type 2  Check a hemoglobin A1c. CBG every 6 hours  . Sliding scale insulin.  #7 hypertension  IV Lopressor.  #8 obstructive sleep apnea  #9 hyperlipidemia  #10 prophylaxis  Heparin for DVT prophylaxis.  Code Status: DNR  Family Communication: updated patient and daughter at bedside.  Disposition Plan: Admit to telemetry     Code Status: full Family Communication: family updated about patient's clinical progress Disposition Plan:  As above    Brief narrative: 79 y.o. female with history of  permanent A. fib on PRADAXA, diastolic CHF, type 2 diabetes, hypertension, hyperlipidemia, hypothyroidism, obstructive sleep apnea, history of breast, bladder, cervical and ovarian cancer as well as recurrent small bowel obstructions secondary to adhesions presents to the ED with a several hour history of nausea and emesis.  Patient stated that she started having some nausea and emesis which began the night prior to admission at around 8:30 PM occurring every hour. Patient denies any hematemesis no melena no hematochezia. Patient denies any abdominal pain. Patient does endorse abdominal distention. Patient denies any fever, no chills, no chest pain, no shortness of breath, no cough, no diarrhea, no constipation. Patient states her last bowel movement was at 10:30 AM the day prior to admission. Patient denies any dysuria. Patient denies any visual deficits. Patient does endorse some generalized weakness.  Patient was seen in the emergency room comprehensive metabolic profile done had a potassium of 3.4 otherwise was within normal limits. CBC done was unremarkable. EKG showed atrial fibrillation with RVR.  Acute abdominal series which was done showed gastric and proximal small bowel gaseous distention worrisome for early or partial small bowel obstruction, prominent cardiac contour, right infrahilar opacity which may reflect atelectasis, aspiration, or pneumonia.  NG tube has been ordered. We were called to admit the patient for further evaluation and management.   Consultants:  Surgery  Procedures:     Antibiotics  None  HPI/Subjective:  Does not remember pulling out her NG tube Wants walker to be placed in the room so that she can  ambulate   Objective: Filed Vitals:   11/11/12 1654 11/11/12 2016 11/12/12 0220 11/12/12 0502  BP: 142/98 119/88 109/51 152/96  Pulse:  114  137  Temp:  97.8 F (36.6 C)  97.8 F (36.6 C)  TempSrc:  Oral  Oral  Resp:  20  22  Height:      Weight:     100.336 kg (221 lb 3.2 oz)  SpO2:  92%  92%    Intake/Output Summary (Last 24 hours) at 11/12/12 1035 Last data filed at 11/12/12 0900  Gross per 24 hour  Intake 1015.91 ml  Output   1250 ml  Net -234.09 ml    Exam:  HENT:  Head: Atraumatic.  Nose: Nose normal.  Mouth/Throat: Oropharynx is clear and moist.  Eyes: Conjunctivae are normal. Pupils are equal, round, and reactive to light. No scleral icterus.  Neck: Neck supple. No tracheal deviation present.  Cardiovascular: Normal rate, regular rhythm, normal heart sounds and intact distal pulses.  Pulmonary/Chest: Effort normal and breath sounds normal. No respiratory distress.  Abdominal: Soft. Normal appearance and bowel sounds are normal. She exhibits no distension. There is no tenderness.  Musculoskeletal: She exhibits no edema and no tenderness.  Neurological: She is alert. No cranial nerve deficit.    Data Reviewed: Basic Metabolic Panel:  Recent Labs Lab 11/11/12 0525 11/11/12 2019 11/12/12 0500  NA 143  --  139  K 3.4*  --  4.3  CL 103  --  107  CO2 27  --  22  GLUCOSE 156*  --  113*  BUN 13  --  11  CREATININE 0.71 0.79 0.78  CALCIUM 9.6  --  8.7  MG 2.2  --   --     Liver Function Tests:  Recent Labs Lab 11/11/12 0525  AST 22  ALT 13  ALKPHOS 79  BILITOT 0.8  PROT 7.3  ALBUMIN 3.8   No results found for this basename: LIPASE, AMYLASE,  in the last 168 hours No results found for this basename: AMMONIA,  in the last 168 hours  CBC:  Recent Labs Lab 11/11/12 0525 11/11/12 2019 11/12/12 0500  WBC 7.9 7.7 7.5  NEUTROABS 6.8  --   --   HGB 15.0 14.4 14.7  HCT 44.0 42.2 44.3  MCV 93.6 93.8 95.9  PLT 162 164 184    Cardiac Enzymes: No results found for this basename: CKTOTAL, CKMB, CKMBINDEX, TROPONINI,  in the last 168 hours BNP (last 3 results) No results found for this basename: PROBNP,  in the last 8760 hours   CBG:  Recent Labs Lab 11/11/12 1217 11/11/12 1651 11/11/12 2002  11/12/12 0024 11/12/12 0557  GLUCAP 94 95 96 102* 113*    No results found for this or any previous visit (from the past 240 hour(s)).   Studies: Abd 1 View (kub)  11/12/2012   *RADIOLOGY REPORT*  Clinical Data: Small bowel obstruction.  ABDOMEN - 1 VIEW  Comparison: 11/11/2012  Findings: Less prominent small bowel dilatation with ingested oral contrast for CT now present in the colon.  Findings are consistent with resolving partial small bowel obstruction/ileus.  IMPRESSION: Improved appearance with resolving small bowel obstruction/ileus.   Original Report Authenticated By: Irish Lack, M.D.   Ct Abdomen Pelvis W Contrast  11/11/2012   *RADIOLOGY REPORT*  Clinical Data: Evaluate small bowel obstruction.  Nausea, vomiting.  CT ABDOMEN AND PELVIS WITH CONTRAST  Technique:  Multidetector CT imaging of the abdomen and pelvis was performed following the  standard protocol during bolus administration of intravenous contrast.  Contrast: OMNIPAQUE IOHEXOL 300 MG/ML  SOLN  Comparison: Plain films 11/11/2012.  CT 07/25/2012.  Findings: There is cardiomegaly.  Small pericardial effusion and right pleural effusion.  No confluent airspace opacities in the lung bases.  NG tube is in place with the tip in the proximal stomach. Decreasing distention of the stomach and proximal small bowel. There appears to be partial malrotation of the small bowel which never fully crosses the midline.  The distal duodenum and proximal jejunum are mildly prominent, but improved since prior study.  I see no obstructing process.  Gradual tapering of the small bowel to normal caliber small bowel distally.  Contrast has passed into the colon.  Mildly nodular contours of the liver suggest cirrhosis.  No focal abnormality.  Spleen, pancreas, adrenals are unremarkable.  Small low-density lesions in the kidneys bilaterally compatible with small cysts.  There is a fatty lesion in the mid pole of the left kidney which measures 14 mm  and is compatible with an angiomyolipoma.  Trace free fluid adjacent to the liver.  Urinary bladder is unremarkable.  Prior hysterectomy.  No adnexal masses.  No free air or adenopathy.  Aorta is normal caliber.  IMPRESSION:  Partial small bowel malrotation.  The duodenum never fully crosses the midline.  There is mild prominence of the distal duodenum and proximal jejunal small bowel loops which have improved since prior study.  Contrast has passed into distal small bowel and colon.  Small pericardial and right pleural effusion.   Original Report Authenticated By: Charlett Nose, M.D.   Dg Chest Port 1 View  11/11/2012   *RADIOLOGY REPORT*  Clinical Data: PICC line placement.  PORTABLE CHEST - 1 VIEW  Comparison: 10/15/2012  Findings: Right upper extremity PICC line is in place with the catheter tip in the lower SVC at the cavoatrial junction.  A nasogastric tube extends below the diaphragm.  Heart is moderately enlarged.  There is atelectasis at both lung bases.  No overt edema.  IMPRESSION: PICC line tip lies at the cavoatrial junction.   Original Report Authenticated By: Irish Lack, M.D.   Dg Abd Acute W/chest  11/11/2012   *RADIOLOGY REPORT*  Clinical Data: Nausea, vomiting.  ACUTE ABDOMEN SERIES (ABDOMEN 2 VIEW & CHEST 1 VIEW)  Comparison: 10/15/2012 chest radiograph and 07/25/2012 abdominopelvic CT  Findings: Right infrahilar consolidation.  Bihilar fullness. Cardiomegaly.  Aortic atherosclerosis.  Gaseous distension of stomach and a presumably proximal small bowel loops of bowel with air-fluid levels. There is air noted within the colon and distal bowel loops, which appear of normal caliber.  No overt evidence for free intraperitoneal air on the decubitus view. Pubic symphysis and lower lumbar DJD.  IMPRESSION: Gastric and proximal small bowel gaseous distension for which an early or partial bowel obstruction is not excluded and can be further evaluated with CT.  Prominent cardiac contour may reflect  cardiomegaly and/or pericardial effusion.  Right infrahilar opacity may reflect atelectasis, aspiration, or pneumonia.   Original Report Authenticated By: Jearld Lesch, M.D.    Scheduled Meds: . insulin aspart  0-9 Units Subcutaneous TID WC  . levothyroxine  100 mcg Intravenous Daily  . metoprolol  5 mg Intravenous Q6H  . piperacillin-tazobactam (ZOSYN)  IV  3.375 g Intravenous Q8H  . sodium chloride  3 mL Intravenous Q12H   Continuous Infusions: . sodium chloride 75 mL/hr at 11/11/12 1228  . heparin 1,500 Units/hr (11/12/12 0848)    Principal  Problem:   SBO (small bowel obstruction) Active Problems:   Chronic diastolic heart failure   Atrial fibrillation   Hypokalemia   Nausea & vomiting   Diabetes mellitus   Hypothyroidism   Hx SBO   Colon cancer   Diastolic CHF, chronic   Venous insufficiency   Hyperlipidemia   OSA (obstructive sleep apnea)   HX: breast cancer   Hx of cervical cancer   H/O ovarian cancer   Hx of bladder cancer   PNA (pneumonia)    Time spent: 40 minutes   [facility]  [facility] Pager [phone]. If 8PM-8AM, please contact night-coverage at [email or web address], password [facility] 11/12/2012, 10:35 AM  LOS: 1 day

## 2012-11-13 ENCOUNTER — Inpatient Hospital Stay (HOSPITAL_COMMUNITY): Payer: Medicare Other

## 2012-11-13 DIAGNOSIS — J9819 Other pulmonary collapse: Secondary | ICD-10-CM | POA: Diagnosis not present

## 2012-11-13 DIAGNOSIS — J90 Pleural effusion, not elsewhere classified: Secondary | ICD-10-CM | POA: Diagnosis not present

## 2012-11-13 DIAGNOSIS — K56609 Unspecified intestinal obstruction, unspecified as to partial versus complete obstruction: Secondary | ICD-10-CM | POA: Diagnosis not present

## 2012-11-13 LAB — CBC
HCT: 45.1 % (ref 36.0–46.0)
MCV: 96.2 fL (ref 78.0–100.0)
RBC: 4.69 MIL/uL (ref 3.87–5.11)
WBC: 7.7 10*3/uL (ref 4.0–10.5)

## 2012-11-13 LAB — GLUCOSE, CAPILLARY
Glucose-Capillary: 128 mg/dL — ABNORMAL HIGH (ref 70–99)
Glucose-Capillary: 99 mg/dL (ref 70–99)
Glucose-Capillary: 129 mg/dL — ABNORMAL HIGH (ref 70–99)
Glucose-Capillary: 128 mg/dL — ABNORMAL HIGH (ref 70–99)

## 2012-11-13 LAB — TROPONIN I: Troponin I: <0.3 ng/mL (ref <0.30)

## 2012-11-13 MED ORDER — BISACODYL 10 MG RE SUPP
10.0000 mg | Freq: Once | RECTAL | Status: DC
  Filled 2012-11-13: qty 1

## 2012-11-13 MED ORDER — DILTIAZEM HCL 30 MG PO TABS
30.0000 mg | ORAL_TABLET | Freq: Four times a day (QID) | ORAL | Status: DC
  Administered 2012-11-13: 30 mg via ORAL
  Filled 2012-11-13 (×5): qty 1

## 2012-11-13 MED ORDER — METOPROLOL TARTRATE 1 MG/ML IV SOLN: 5.0000 mg | INTRAVENOUS | Status: DC

## 2012-11-13 MED ORDER — DILTIAZEM HCL 60 MG PO TABS
60.0000 mg | ORAL_TABLET | Freq: Four times a day (QID) | ORAL | Status: DC
  Administered 2012-11-13 – 2012-11-14 (×5): 60 mg via ORAL
  Filled 2012-11-13 (×8): qty 1

## 2012-11-13 MED ORDER — METOPROLOL TARTRATE 1 MG/ML IV SOLN
5.0000 mg | INTRAVENOUS | Status: AC
  Administered 2012-11-13: 5 mg via INTRAVENOUS

## 2012-11-13 MED ORDER — BISACODYL 10 MG RE SUPP: 10.0000 mg | Freq: Once | RECTAL | Status: DC

## 2012-11-13 MED ORDER — METOPROLOL TARTRATE 1 MG/ML IV SOLN: 5.0000 mg | INTRAVENOUS | Status: DC | PRN

## 2012-11-13 MED ORDER — DILTIAZEM HCL 25 MG/5ML IV SOLN
5.0000 mg | Freq: Once | INTRAVENOUS | Status: AC
  Administered 2012-11-13: 5 mg via INTRAVENOUS
  Filled 2012-11-13: qty 5

## 2012-11-13 MED ORDER — DILTIAZEM HCL 25 MG/5ML IV SOLN
10.0000 mg | Freq: Four times a day (QID) | INTRAVENOUS | Status: DC | PRN
  Filled 2012-11-13: qty 5

## 2012-11-13 NOTE — Progress Notes (Signed)
Agree with PT treatment note.  Rakeem Colley, PT DPT [phone]  

## 2012-11-13 NOTE — Progress Notes (Addendum)
[facility] PROGRESS NOTE  [name] AVW:[id number] DOB: 03-28-1920 DOA: 11/11/2012 PCP: [name], MD  Assessment/Plan: Principal Problem:   SBO (small bowel obstruction) Active Problems:   Chronic diastolic heart failure   Atrial fibrillation   Hypokalemia   Nausea & vomiting   Diabetes mellitus   Hypothyroidism   Hx SBO   Colon cancer   Diastolic CHF, chronic   Venous insufficiency   Hyperlipidemia   OSA (obstructive sleep apnea)   HX: breast cancer   Hx of cervical cancer   H/O ovarian cancer   Hx of bladder cancer   PNA (pneumonia)    #1 Early versus partial small bowel obstruction Improving  Clear liquid diet  Advance as tolerated today Does not want surgical intervention  We'll also try giving Dulcolax suppository today   #2 probable aspiration pneumonia  Chest x-ray with right infrahilar opacity worrisome for atelectasis versus aspiration versus pneumonia. Patient with emesis over several hours. Patient is currently afebrile. CBC however does have a left shift. We'll place empirically on IV Zosyn. Shortness of breath last night, will repeat chest x-ray, cardiac enzymes  #3 hypokalemia  Replete.   #4 hypothyroidism  Will place on IV Synthroid. Once tolerating oral intake we'll switch to oral Synthroid.   #5 A. fib with RVR  Tachycardic last night. Patient denies any chest pain or shortness of breath. We'll place on Lopressor IV 5 mg every 8 hours for rate control and titrate. Pradaxa on hold. On heparin drip. This surgery OK, DC heparin drip and resume Pradaxa Continue oral and IV Cardizem   #6 Diabetes mellitus type 2  Check a hemoglobin A1c. CBG every 6 hours  . Sliding scale insulin.   #7 hypertension  IV Lopressor.   #8 obstructive sleep apnea  #9 hyperlipidemia  #10 prophylaxis  Heparin for DVT prophylaxis.   Code Status: DNR  Family Communication: updated patient and daughter at bedside.  Disposition Plan: Admit to  telemetry      Brief narrative:  79 y.o. female with history of permanent A. fib on PRADAXA, diastolic CHF, type 2 diabetes, hypertension, hyperlipidemia, hypothyroidism, obstructive sleep apnea, history of breast, bladder, cervical and ovarian cancer as well as recurrent small bowel obstructions secondary to adhesions presents to the ED with a several hour history of nausea and emesis.  Patient stated that she started having some nausea and emesis which began the night prior to admission at around 8:30 PM occurring every hour. Patient denies any hematemesis no melena no hematochezia. Patient denies any abdominal pain. Patient does endorse abdominal distention. Patient denies any fever, no chills, no chest pain, no shortness of breath, no cough, no diarrhea, no constipation. Patient states her last bowel movement was at 10:30 AM the day prior to admission. Patient denies any dysuria. Patient denies any visual deficits. Patient does endorse some generalized weakness.  Patient was seen in the emergency room comprehensive metabolic profile done had a potassium of 3.4 otherwise was within normal limits. CBC done was unremarkable. EKG showed atrial fibrillation with RVR.  Acute abdominal series which was done showed gastric and proximal small bowel gaseous distention worrisome for early or partial small bowel obstruction, prominent cardiac contour, right infrahilar opacity which may reflect atelectasis, aspiration, or pneumonia.  NG tube has been ordered. We were called to admit the patient for further evaluation and management.  Consultants:  Surgery Procedures:  Antibiotics  None   HPI/Subjective: To BM last night   Objective: Filed Vitals:  11/13/12 0113 11/13/12 0327 11/13/12 0536 11/13/12 0657  BP: 143/86 139/106 138/86 151/110  Pulse: 145 130  107  Temp:    97.3 F (36.3 C)  TempSrc:    Oral  Resp:    18  Height:      Weight:    102.649 kg (226 lb 4.8 oz)  SpO2:    94%     Intake/Output Summary (Last 24 hours) at 11/13/12 0907 Last data filed at 11/13/12 0852  Gross per 24 hour  Intake   1080 ml  Output    725 ml  Net    355 ml    Exam:  HENT:  Head: Atraumatic.  Nose: Nose normal.  Mouth/Throat: Oropharynx is clear and moist.  Eyes: Conjunctivae are normal. Pupils are equal, round, and reactive to light. No scleral icterus.  Neck: Neck supple. No tracheal deviation present.  Cardiovascular: Normal rate, regular rhythm, normal heart sounds and intact distal pulses.  Pulmonary/Chest: Effort normal and breath sounds normal. No respiratory distress.  Abdominal: Soft. Normal appearance and bowel sounds are normal. She exhibits no distension. There is no tenderness.  Musculoskeletal: She exhibits no edema and no tenderness.  Neurological: She is alert. No cranial nerve deficit.    Data Reviewed: Basic Metabolic Panel:  Recent Labs Lab 11/11/12 0525 11/11/12 2019 11/12/12 0500  NA 143  --  139  K 3.4*  --  4.3  CL 103  --  107  CO2 27  --  22  GLUCOSE 156*  --  113*  BUN 13  --  11  CREATININE 0.71 0.79 0.78  CALCIUM 9.6  --  8.7  MG 2.2  --   --     Liver Function Tests:  Recent Labs Lab 11/11/12 0525  AST 22  ALT 13  ALKPHOS 79  BILITOT 0.8  PROT 7.3  ALBUMIN 3.8   No results found for this basename: LIPASE, AMYLASE,  in the last 168 hours No results found for this basename: AMMONIA,  in the last 168 hours  CBC:  Recent Labs Lab 11/11/12 0525 11/11/12 2019 11/12/12 0500 11/13/12 0440  WBC 7.9 7.7 7.5 7.7  NEUTROABS 6.8  --   --   --   HGB 15.0 14.4 14.7 14.8  HCT 44.0 42.2 44.3 45.1  MCV 93.6 93.8 95.9 96.2  PLT 162 164 184 155    Cardiac Enzymes: No results found for this basename: CKTOTAL, CKMB, CKMBINDEX, TROPONINI,  in the last 168 hours BNP (last 3 results) No results found for this basename: PROBNP,  in the last 8760 hours   CBG:  Recent Labs Lab 11/12/12 0557 11/12/12 1103 11/12/12 1617  11/12/12 2015 11/13/12 0546  GLUCAP 113* 96 92 129* 128*    Recent Results (from the past 240 hour(s))  URINE CULTURE     Status: None   Collection Time    11/11/12  3:11 PM      Result Value Range Status   Specimen Description URINE, RANDOM   Final   Special Requests NONE   Final   Culture  Setup Time 11/11/2012 16:30   Final   Colony Count 50,000 COLONIES/ML   Final   Culture     Final   Value: Multiple bacterial morphotypes present, none predominant. Suggest appropriate recollection if clinically indicated.   Report Status 11/12/2012 FINAL   Final     Studies: Abd 1 View (kub)  11/12/2012   *RADIOLOGY REPORT*  Clinical Data: Small bowel obstruction.  ABDOMEN - 1 VIEW  Comparison: 11/11/2012  Findings: Less prominent small bowel dilatation with ingested oral contrast for CT now present in the colon.  Findings are consistent with resolving partial small bowel obstruction/ileus.  IMPRESSION: Improved appearance with resolving small bowel obstruction/ileus.   Original Report Authenticated By: Irish Lack, M.D.   Ct Abdomen Pelvis W Contrast  11/11/2012   *RADIOLOGY REPORT*  Clinical Data: Evaluate small bowel obstruction.  Nausea, vomiting.  CT ABDOMEN AND PELVIS WITH CONTRAST  Technique:  Multidetector CT imaging of the abdomen and pelvis was performed following the standard protocol during bolus administration of intravenous contrast.  Contrast: OMNIPAQUE IOHEXOL 300 MG/ML  SOLN  Comparison: Plain films 11/11/2012.  CT 07/25/2012.  Findings: There is cardiomegaly.  Small pericardial effusion and right pleural effusion.  No confluent airspace opacities in the lung bases.  NG tube is in place with the tip in the proximal stomach. Decreasing distention of the stomach and proximal small bowel. There appears to be partial malrotation of the small bowel which never fully crosses the midline.  The distal duodenum and proximal jejunum are mildly prominent, but improved since prior study.   I see no obstructing process.  Gradual tapering of the small bowel to normal caliber small bowel distally.  Contrast has passed into the colon.  Mildly nodular contours of the liver suggest cirrhosis.  No focal abnormality.  Spleen, pancreas, adrenals are unremarkable.  Small low-density lesions in the kidneys bilaterally compatible with small cysts.  There is a fatty lesion in the mid pole of the left kidney which measures 14 mm and is compatible with an angiomyolipoma.  Trace free fluid adjacent to the liver.  Urinary bladder is unremarkable.  Prior hysterectomy.  No adnexal masses.  No free air or adenopathy.  Aorta is normal caliber.  IMPRESSION:  Partial small bowel malrotation.  The duodenum never fully crosses the midline.  There is mild prominence of the distal duodenum and proximal jejunal small bowel loops which have improved since prior study.  Contrast has passed into distal small bowel and colon.  Small pericardial and right pleural effusion.   Original Report Authenticated By: Charlett Nose, M.D.   Dg Chest Port 1 View  11/11/2012   *RADIOLOGY REPORT*  Clinical Data: PICC line placement.  PORTABLE CHEST - 1 VIEW  Comparison: 10/15/2012  Findings: Right upper extremity PICC line is in place with the catheter tip in the lower SVC at the cavoatrial junction.  A nasogastric tube extends below the diaphragm.  Heart is moderately enlarged.  There is atelectasis at both lung bases.  No overt edema.  IMPRESSION: PICC line tip lies at the cavoatrial junction.   Original Report Authenticated By: Irish Lack, M.D.   Dg Abd Acute W/chest  11/11/2012   *RADIOLOGY REPORT*  Clinical Data: Nausea, vomiting.  ACUTE ABDOMEN SERIES (ABDOMEN 2 VIEW & CHEST 1 VIEW)  Comparison: 10/15/2012 chest radiograph and 07/25/2012 abdominopelvic CT  Findings: Right infrahilar consolidation.  Bihilar fullness. Cardiomegaly.  Aortic atherosclerosis.  Gaseous distension of stomach and a presumably proximal small bowel loops of  bowel with air-fluid levels. There is air noted within the colon and distal bowel loops, which appear of normal caliber.  No overt evidence for free intraperitoneal air on the decubitus view. Pubic symphysis and lower lumbar DJD.  IMPRESSION: Gastric and proximal small bowel gaseous distension for which an early or partial bowel obstruction is not excluded and can be further evaluated with CT.  Prominent cardiac contour may  reflect cardiomegaly and/or pericardial effusion.  Right infrahilar opacity may reflect atelectasis, aspiration, or pneumonia.   Original Report Authenticated By: Jearld Lesch, M.D.    Scheduled Meds: . bisacodyl  10 mg Rectal Once  . diltiazem  30 mg Oral Q6H  . insulin aspart  0-9 Units Subcutaneous TID WC  . levothyroxine  100 mcg Intravenous Daily  . piperacillin-tazobactam (ZOSYN)  IV  3.375 g Intravenous Q8H  . sodium chloride  3 mL Intravenous Q12H   Continuous Infusions: . sodium chloride 75 mL/hr at 11/13/12 0751  . heparin 1,600 Units/hr (11/13/12 0116)    Principal Problem:   SBO (small bowel obstruction) Active Problems:   Chronic diastolic heart failure   Atrial fibrillation   Hypokalemia   Nausea & vomiting   Diabetes mellitus   Hypothyroidism   Hx SBO   Colon cancer   Diastolic CHF, chronic   Venous insufficiency   Hyperlipidemia   OSA (obstructive sleep apnea)   HX: breast cancer   Hx of cervical cancer   H/O ovarian cancer   Hx of bladder cancer   PNA (pneumonia)    Time spent: 40 minutes   [facility]  [facility] Pager [phone]. If 8PM-8AM, please contact night-coverage at [email or web address], password [facility] 11/13/2012, 9:07 AM  LOS: 2 days

## 2012-11-13 NOTE — Progress Notes (Addendum)
ANTICOAGULATION CONSULT NOTE - Follow Up Consult  Pharmacy Consult for UFH Indication: atrial fibrillation  Allergies  Allergen Reactions  . Codeine     unknown  . Morphine And Related     sick  . Statins     sick    Patient Measurements: Height: 5\' 2"  (157.5 cm) Weight: 226 lb 4.8 oz (102.649 kg) (SCALE A) IBW/kg (Calculated) : 50.1 Heparin Dosing Weight: 73kg  Vital Signs: Temp: 97.3 F (36.3 C) (05/15 0657) Temp src: Oral (05/15 0657) BP: 151/110 mmHg (05/15 0657) Pulse Rate: 107 (05/15 0657)  Labs:  Recent Labs  11/11/12 0525 11/11/12 0529  11/11/12 2019 11/12/12 0500 11/12/12 1339 11/13/12 0440  HGB 15.0  --   --  14.4 14.7  --  14.8  HCT 44.0  --   --  42.2 44.3  --  45.1  PLT 162  --   --  164 184  --  155  LABPROT  --  14.6  --   --   --   --   --   INR  --  1.16  --   --   --   --   --   HEPARINUNFRC  --   --   < > 0.10* <0.10* 0.28* 0.48  CREATININE 0.71  --   --  0.79 0.78  --   --   < > = values in this interval not displayed.  Estimated Creatinine Clearance: 51.4 ml/min (by C-G formula based on Cr of 0.78).   Medications:  Scheduled:  . bisacodyl  10 mg Rectal Once  . diltiazem  60 mg Oral Q6H  . insulin aspart  0-9 Units Subcutaneous TID WC  . levothyroxine  100 mcg Intravenous Daily  . piperacillin-tazobactam (ZOSYN)  IV  3.375 g Intravenous Q8H  . sodium chloride  3 mL Intravenous Q12H    Assessment: 79 y/o female patient admitted with n/v and recurrent SBO, on pradaxa pta for h/o afib. Now receiving IV heparin while pradaxa is being held. Heparin just below goal at 0.48 on 1600 units/hr. No problem with line / infusion and no bleeding per RN.    Patient is declining any surgical interventions at this time.  Possible aspiration pneumonia - patient continues on day 3 of zosyn. No fevers have been noted, wbc continues to be normal. No dose adjustments with current renal function.  5/13 urine - 50k colonies/ml - no significant  growth.  Goal of Therapy:  Heparin level 0.3-0.7 units/ml Monitor platelets by anticoagulation protocol: Yes   Plan:  1. Continue IV heparin to 1600 units/hr.  2. Daily HL/CBC 3. Follow up restart of pradaxa 4. Continue extended interval dosing of zosyn for now  Sheppard Coil PharmD., BCPS Clinical Pharmacist Pager [phone] 11/13/2012 9:56 AM

## 2012-11-13 NOTE — Progress Notes (Signed)
Physical Therapy Treatment Patient Details Name: [name] MRN: [id number] DOB: [birth year] Today's Date: 11/13/2012 Time: 1478-2956 PT Time Calculation (min): 19 min  PT Assessment / Plan / Recommendation Comments on Treatment Session  Pt is a 79 yo female with CHF. Pt w/ good transfer hand placement and sequencing but significantly deconditioned. Pt becomes fatigued with 50 ft of ambulation with RW and require's v/c's for appropriate R/W use. Pt has decreased understanding of her deficits and nursing has reported she tried to stand and ambulate with the IV pole and R/W. Pt would benefit from HHPT for d/c home to improve endurance and DME safety. Pt would require  24/7 supervision and assistance for IADLs for safe return home.    Follow Up Recommendations  Home health PT;Supervision/Assistance - 24 hour     Does the patient have the potential to tolerate intense rehabilitation     Barriers to Discharge        Equipment Recommendations  None recommended by PT    Recommendations for Other Services    Frequency Min 3X/week   Plan Discharge plan remains appropriate    Precautions / Restrictions Precautions Precautions: Fall Restrictions Weight Bearing Restrictions: No   Pertinent Vitals/Pain Pt denies pain but reports neuropathy on the bottoms of both feet from diabetes     Mobility  Bed Mobility Bed Mobility: Not assessed Transfers Transfers: Sit to Stand;Stand to Sit Sit to Stand: 4: Min guard;With upper extremity assist;From chair/3-in-1 Stand to Sit: 4: Min guard;With upper extremity assist;To chair/3-in-1 Details for Transfer Assistance: Increased time, good hand placement for transfers Ambulation/Gait Ambulation/Gait Assistance: 4: Min guard Ambulation Distance (Feet): 100 Feet (w/ rest x1 and pausing multiple times for pt to talk) Assistive device: Rolling walker Ambulation/Gait Assistance Details: Pt required min v/c's to stay close to walker, pt reports  her walker at home has a basket that has trained her to stay back Gait Pattern: Step-through pattern;Decreased stride length;Wide base of support Gait velocity: wfl General Gait Details: Pt ambulates safely but becomes fatigued after 50 ft and must rest.     Exercises Total Joint Exercises Ankle Circles/Pumps: AROM;10 reps;Both;Seated   PT Diagnosis:    PT Problem List:   PT Treatment Interventions:     PT Goals Acute Rehab PT Goals PT Goal Formulation: With patient Time For Goal Achievement: 11/26/12 Potential to Achieve Goals: Good PT Goal: Sit to Stand - Progress: Progressing toward goal PT Goal: Ambulate - Progress: Progressing toward goal PT Goal: Perform Home Exercise Program - Progress: Progressing toward goal  Visit Information  Last PT Received On: 11/13/12    Subjective Data  Subjective: Pt recieved in chair, agreeable to PT   Cognition  Cognition Arousal/Alertness: Awake/alert Behavior During Therapy: [facility] for tasks assessed/performed (Nursing reports pt has tried to get up independently) Overall Cognitive Status: Within Functional Limits for tasks assessed    Balance  Dynamic Standing Balance Dynamic Standing - Balance Support: Right upper extremity supported Dynamic Standing - Level of Assistance: 5: Stand by assistance Dynamic Standing - Balance Activities:  (Pt leaning forward to demo her walker accessories) Dynamic Standing - Comments: Required use of RW to maintain balance  End of Session PT - End of Session Equipment Utilized During Treatment: Gait belt Activity Tolerance: Patient limited by fatigue Patient left: with call bell/phone within reach;with chair alarm set;in chair;with nursing in room Nurse Communication:  (Nursing request RW be left out of reach, pt impulsive)   GP    11/13/2012, 2:53  PM Marvis Moeller, Student Physical Therapist Office #: [phone]

## 2012-11-13 NOTE — Progress Notes (Addendum)
Pt SOB with O2 sat 81% on RA. HR up to 150. A fib/flutter Pt put on 4L O2 and O2 sat to 98%. HR down to 120s. Pt no longer short of breathe, O2 weaned to 2L. MD notified and new orders given.  Will continue to monitor pt. Baron Hamper, RN 11/13/2012 3:19 AM

## 2012-11-13 NOTE — Progress Notes (Signed)
Pt HR sustaining 120-140 while pt sleeping. MD notified and new order for metoprolol 5mg  IV. Will continue to monitor Baron Hamper, RN 11/13/2012 1:30 AM

## 2012-11-14 DIAGNOSIS — K56609 Unspecified intestinal obstruction, unspecified as to partial versus complete obstruction: Secondary | ICD-10-CM | POA: Diagnosis not present

## 2012-11-14 LAB — BASIC METABOLIC PANEL
CO2: 20 mEq/L (ref 19–32)
Calcium: 8.8 mg/dL (ref 8.4–10.5)
Creatinine, Ser: 0.73 mg/dL (ref 0.50–1.10)
GFR calc non Af Amer: 72 mL/min — ABNORMAL LOW (ref >90)
Glucose, Bld: 141 mg/dL — ABNORMAL HIGH (ref 70–99)
Sodium: 137 mEq/L (ref 135–145)

## 2012-11-14 LAB — CBC
Hemoglobin: 13.2 g/dL (ref 12.0–15.0)
MCH: 30.8 pg (ref 26.0–34.0)
MCHC: 32.4 g/dL (ref 30.0–36.0)
RDW: 16.3 % — ABNORMAL HIGH (ref 11.5–15.5)

## 2012-11-14 LAB — GLUCOSE, CAPILLARY
Glucose-Capillary: 150 mg/dL — ABNORMAL HIGH (ref 70–99)
Glucose-Capillary: 116 mg/dL — ABNORMAL HIGH (ref 70–99)

## 2012-11-14 LAB — HEPARIN LEVEL (UNFRACTIONATED): Heparin Unfractionated: 0.41 IU/mL (ref 0.30–0.70)

## 2012-11-14 MED ORDER — FUROSEMIDE 80 MG PO TABS
80.0000 mg | ORAL_TABLET | Freq: Every day | ORAL | Status: DC
  Administered 2012-11-14: 80 mg via ORAL
  Filled 2012-11-14: qty 1

## 2012-11-14 MED ORDER — BISACODYL 10 MG RE SUPP: 10.0000 mg | RECTAL | Status: DC | PRN

## 2012-11-14 MED ORDER — DOXYCYCLINE HYCLATE 50 MG PO CAPS: 50.0000 mg | ORAL_CAPSULE | Freq: Two times a day (BID) | ORAL | Status: AC

## 2012-11-14 MED ORDER — DABIGATRAN ETEXILATE MESYLATE 150 MG PO CAPS
150.0000 mg | ORAL_CAPSULE | Freq: Two times a day (BID) | ORAL | Status: DC
  Administered 2012-11-14: 150 mg via ORAL
  Filled 2012-11-14 (×2): qty 1

## 2012-11-14 NOTE — Discharge Summary (Signed)
Physician Discharge Summary  EWELINA NAVES MRN: [id number] DOB/AGE: Mar 11, 1920 79 y.o.  PCP: [name], MD   Admit date: 11/11/2012 Discharge date: 11/14/2012  Discharge Diagnoses:   :   SBO (small bowel obstruction) Active Problems:   Chronic diastolic heart failure   Atrial fibrillation   Hypokalemia   Nausea & vomiting   Diabetes mellitus   Hypothyroidism   Hx SBO   Colon cancer   Diastolic CHF, chronic   Venous insufficiency   Hyperlipidemia   OSA (obstructive sleep apnea)   HX: breast cancer   Hx of cervical cancer   H/O ovarian cancer   Hx of bladder cancer   PNA (pneumonia)     Medication List    TAKE these medications       bisacodyl 10 MG suppository  Commonly known as:  DULCOLAX  Place 1 suppository (10 mg total) rectally as needed for constipation.     CARDIZEM CD 360 MG 24 hr capsule  Generic drug:  diltiazem  Take 1 capsule (360 mg total) by mouth daily.     dabigatran 150 MG Caps  Commonly known as:  PRADAXA  Take 1 capsule (150 mg total) by mouth every 12 (twelve) hours.     doxycycline 50 MG capsule  Commonly known as:  VIBRAMYCIN  Take 1 capsule (50 mg total) by mouth 2 (two) times daily.     ferrous sulfate 325 (65 FE) MG tablet  Take 325 mg by mouth 2 (two) times daily.     furosemide 40 MG tablet  Commonly known as:  LASIX  Take 80 mg by mouth 2 (two) times daily.     levothyroxine 200 MCG tablet  Commonly known as:  SYNTHROID, LEVOTHROID  Take 200 mcg by mouth every morning.     potassium chloride SA 20 MEQ tablet  Commonly known as:  K-DUR,KLOR-CON  Take 1 tablet (20 mEq total) by mouth daily.     ramipril 10 MG capsule  Commonly known as:  ALTACE  Take 10 mg by mouth every morning.        Discharge Condition:  Stable Disposition: 01-Home or Self Care   Consults:  Surgery   Significant Diagnostic Studies: Abd 1 View (kub)  11/12/2012   *RADIOLOGY REPORT*  Clinical Data: Small bowel  obstruction.  ABDOMEN - 1 VIEW  Comparison: 11/11/2012  Findings: Less prominent small bowel dilatation with ingested oral contrast for CT now present in the colon.  Findings are consistent with resolving partial small bowel obstruction/ileus.  IMPRESSION: Improved appearance with resolving small bowel obstruction/ileus.   Original Report Authenticated By: Irish Lack, M.D.   Ct Abdomen Pelvis W Contrast  11/11/2012   *RADIOLOGY REPORT*  Clinical Data: Evaluate small bowel obstruction.  Nausea, vomiting.  CT ABDOMEN AND PELVIS WITH CONTRAST  Technique:  Multidetector CT imaging of the abdomen and pelvis was performed following the standard protocol during bolus administration of intravenous contrast.  Contrast: OMNIPAQUE IOHEXOL 300 MG/ML  SOLN  Comparison: Plain films 11/11/2012.  CT 07/25/2012.  Findings: There is cardiomegaly.  Small pericardial effusion and right pleural effusion.  No confluent airspace opacities in the lung bases.  NG tube is in place with the tip in the proximal stomach. Decreasing distention of the stomach and proximal small bowel. There appears to be partial malrotation of the small bowel which never fully crosses the midline.  The distal duodenum and proximal jejunum are mildly prominent, but improved since prior study.  I see  no obstructing process.  Gradual tapering of the small bowel to normal caliber small bowel distally.  Contrast has passed into the colon.  Mildly nodular contours of the liver suggest cirrhosis.  No focal abnormality.  Spleen, pancreas, adrenals are unremarkable.  Small low-density lesions in the kidneys bilaterally compatible with small cysts.  There is a fatty lesion in the mid pole of the left kidney which measures 14 mm and is compatible with an angiomyolipoma.  Trace free fluid adjacent to the liver.  Urinary bladder is unremarkable.  Prior hysterectomy.  No adnexal masses.  No free air or adenopathy.  Aorta is normal caliber.  IMPRESSION:  Partial  small bowel malrotation.  The duodenum never fully crosses the midline.  There is mild prominence of the distal duodenum and proximal jejunal small bowel loops which have improved since prior study.  Contrast has passed into distal small bowel and colon.  Small pericardial and right pleural effusion.   Original Report Authenticated By: Charlett Nose, M.D.   Dg Chest Port 1 View  11/13/2012   *RADIOLOGY REPORT*  Clinical Data: Shortness of breath, history hypertension, diabetes, CHF, atrial fibrillation, breast cancer, ovarian cancer, bladder cancer, cervical cancer, colon cancer  PORTABLE CHEST - 1 VIEW  Comparison: Portable exam 0937 hours compared to 11/11/2012  Findings: Right arm PICC line tip projects over SVC. Enlargement of cardiac silhouette with pulmonary vascular congestion. Atherosclerotic calcification of a tortuous thoracic aorta. Small bibasilar effusions and minimal bibasilar atelectasis increased since previous exam. Increased perihilar markings likely related to pulmonary venous hypertension and minimal perihilar edema. No segmental consolidation or pneumothorax. Bones diffusely demineralized.  IMPRESSION: Suspect minimal CHF with small bibasilar effusions atelectasis.   Original Report Authenticated By: Ulyses Southward, M.D.   Dg Chest Port 1 View  11/11/2012   *RADIOLOGY REPORT*  Clinical Data: PICC line placement.  PORTABLE CHEST - 1 VIEW  Comparison: 10/15/2012  Findings: Right upper extremity PICC line is in place with the catheter tip in the lower SVC at the cavoatrial junction.  A nasogastric tube extends below the diaphragm.  Heart is moderately enlarged.  There is atelectasis at both lung bases.  No overt edema.  IMPRESSION: PICC line tip lies at the cavoatrial junction.   Original Report Authenticated By: Irish Lack, M.D.   Dg Abd Acute W/chest  11/11/2012   *RADIOLOGY REPORT*  Clinical Data: Nausea, vomiting.  ACUTE ABDOMEN SERIES (ABDOMEN 2 VIEW & CHEST 1 VIEW)  Comparison:  10/15/2012 chest radiograph and 07/25/2012 abdominopelvic CT  Findings: Right infrahilar consolidation.  Bihilar fullness. Cardiomegaly.  Aortic atherosclerosis.  Gaseous distension of stomach and a presumably proximal small bowel loops of bowel with air-fluid levels. There is air noted within the colon and distal bowel loops, which appear of normal caliber.  No overt evidence for free intraperitoneal air on the decubitus view. Pubic symphysis and lower lumbar DJD.  IMPRESSION: Gastric and proximal small bowel gaseous distension for which an early or partial bowel obstruction is not excluded and can be further evaluated with CT.  Prominent cardiac contour may reflect cardiomegaly and/or pericardial effusion.  Right infrahilar opacity may reflect atelectasis, aspiration, or pneumonia.   Original Report Authenticated By: Jearld Lesch, M.D.       Microbiology: Recent Results (from the past 240 hour(s))  URINE CULTURE     Status: None   Collection Time    11/11/12  3:11 PM      Result Value Range Status   Specimen Description URINE, RANDOM  Final   Special Requests NONE   Final   Culture  Setup Time 11/11/2012 16:30   Final   Colony Count 50,000 COLONIES/ML   Final   Culture     Final   Value: Multiple bacterial morphotypes present, none predominant. Suggest appropriate recollection if clinically indicated.   Report Status 11/12/2012 FINAL   Final     Labs: Results for orders placed during the hospital encounter of 11/11/12 (from the past 48 hour(s))  GLUCOSE, CAPILLARY     Status: None   Collection Time    11/12/12 11:03 AM      Result Value Range   Glucose-Capillary 96  70 - 99 mg/dL   Comment 1 Notify RN    HEPARIN LEVEL (UNFRACTIONATED)     Status: Abnormal   Collection Time    11/12/12  1:39 PM      Result Value Range   Heparin Unfractionated 0.28 (*) 0.30 - 0.70 IU/mL   Comment:            IF HEPARIN RESULTS ARE BELOW     EXPECTED VALUES, AND PATIENT     DOSAGE HAS BEEN  CONFIRMED,     SUGGEST FOLLOW UP TESTING     OF ANTITHROMBIN III LEVELS.  GLUCOSE, CAPILLARY     Status: None   Collection Time    11/12/12  4:17 PM      Result Value Range   Glucose-Capillary 92  70 - 99 mg/dL   Comment 1 Notify RN    GLUCOSE, CAPILLARY     Status: Abnormal   Collection Time    11/12/12  8:15 PM      Result Value Range   Glucose-Capillary 129 (*) 70 - 99 mg/dL   Comment 1 Notify RN    CBC     Status: Abnormal   Collection Time    11/13/12  4:40 AM      Result Value Range   WBC 7.7  4.0 - 10.5 K/uL   RBC 4.69  3.87 - 5.11 MIL/uL   Hemoglobin 14.8  12.0 - 15.0 g/dL   HCT 56.2  13.0 - 86.5 %   MCV 96.2  78.0 - 100.0 fL   MCH 31.6  26.0 - 34.0 pg   MCHC 32.8  30.0 - 36.0 g/dL   RDW 78.4 (*) 69.6 - 29.5 %   Platelets 155  150 - 400 K/uL  HEPARIN LEVEL (UNFRACTIONATED)     Status: None   Collection Time    11/13/12  4:40 AM      Result Value Range   Heparin Unfractionated 0.48  0.30 - 0.70 IU/mL   Comment:            IF HEPARIN RESULTS ARE BELOW     EXPECTED VALUES, AND PATIENT     DOSAGE HAS BEEN CONFIRMED,     SUGGEST FOLLOW UP TESTING     OF ANTITHROMBIN III LEVELS.  GLUCOSE, CAPILLARY     Status: Abnormal   Collection Time    11/13/12  5:46 AM      Result Value Range   Glucose-Capillary 128 (*) 70 - 99 mg/dL  GLUCOSE, CAPILLARY     Status: None   Collection Time    11/13/12 11:45 AM      Result Value Range   Glucose-Capillary 99  70 - 99 mg/dL   Comment 1 Notify RN    TROPONIN I     Status: None   Collection Time  11/13/12  1:50 PM      Result Value Range   Troponin I <0.30  <0.30 ng/mL   Comment:            Due to the release kinetics of cTnI,     a negative result within the first hours     of the onset of symptoms does not rule out     myocardial infarction with certainty.     If myocardial infarction is still suspected,     repeat the test at appropriate intervals.  GLUCOSE, CAPILLARY     Status: Abnormal   Collection Time     11/13/12  4:02 PM      Result Value Range   Glucose-Capillary 129 (*) 70 - 99 mg/dL   Comment 1 Notify RN    GLUCOSE, CAPILLARY     Status: Abnormal   Collection Time    11/13/12  8:55 PM      Result Value Range   Glucose-Capillary 128 (*) 70 - 99 mg/dL  GLUCOSE, CAPILLARY     Status: Abnormal   Collection Time    11/14/12  6:02 AM      Result Value Range   Glucose-Capillary 150 (*) 70 - 99 mg/dL  CBC     Status: Abnormal   Collection Time    11/14/12  6:15 AM      Result Value Range   WBC 7.2  4.0 - 10.5 K/uL   RBC 4.28  3.87 - 5.11 MIL/uL   Hemoglobin 13.2  12.0 - 15.0 g/dL   HCT 96.0  45.4 - 09.8 %   MCV 95.3  78.0 - 100.0 fL   MCH 30.8  26.0 - 34.0 pg   MCHC 32.4  30.0 - 36.0 g/dL   RDW 11.9 (*) 14.7 - 82.9 %   Platelets 146 (*) 150 - 400 K/uL  HEPARIN LEVEL (UNFRACTIONATED)     Status: None   Collection Time    11/14/12  6:15 AM      Result Value Range   Heparin Unfractionated 0.41  0.30 - 0.70 IU/mL   Comment:            IF HEPARIN RESULTS ARE BELOW     EXPECTED VALUES, AND PATIENT     DOSAGE HAS BEEN CONFIRMED,     SUGGEST FOLLOW UP TESTING     OF ANTITHROMBIN III LEVELS.  BASIC METABOLIC PANEL     Status: Abnormal   Collection Time    11/14/12  6:15 AM      Result Value Range   Sodium 137  135 - 145 mEq/L   Potassium 3.9  3.5 - 5.1 mEq/L   Chloride 104  96 - 112 mEq/L   CO2 20  19 - 32 mEq/L   Glucose, Bld 141 (*) 70 - 99 mg/dL   BUN 9  6 - 23 mg/dL   Creatinine, Ser 5.62  0.50 - 1.10 mg/dL   Calcium 8.8  8.4 - 13.0 mg/dL   GFR calc non Af Amer 72 (*) >90 mL/min   GFR calc Af Amer 84 (*) >90 mL/min   Comment:            The eGFR has been calculated     using the CKD EPI equation.     This calculation has not been     validated in all clinical     situations.     eGFR's persistently     <90 mL/min signify  possible Chronic Kidney Disease.     HPI :*Brief narrative:  79 y.o. female with history of permanent A. fib on PRADAXA, diastolic CHF,  type 2 diabetes, hypertension, hyperlipidemia, hypothyroidism, obstructive sleep apnea, history of breast, bladder, cervical and ovarian cancer as well as recurrent small bowel obstructions secondary to adhesions presents to the ED with a several hour history of nausea and emesis.  Patient stated that she started having some nausea and emesis which began the night prior to admission at around 8:30 PM occurring every hour. Patient denies any hematemesis no melena no hematochezia. Patient denies any abdominal pain. Patient does endorse abdominal distention. Patient denies any fever, no chills, no chest pain, no shortness of breath, no cough, no diarrhea, no constipation. Patient states her last bowel movement was at 10:30 AM the day prior to admission. Patient denies any dysuria. Patient denies any visual deficits. Patient does endorse some generalized weakness.  Patient was seen in the emergency room comprehensive metabolic profile done had a potassium of 3.4 otherwise was within normal limits. CBC done was unremarkable. EKG showed atrial fibrillation with RVR.  Acute abdominal series which was done showed gastric and proximal small bowel gaseous distention worrisome for early or partial small bowel obstruction, prominent cardiac contour, right infrahilar opacity which may reflect atelectasis, aspiration, or pneumonia.     HOSPITAL COURSE:   #1 Early versus partial small bowel obstruction  Improving  Had 2 BMs yesterday, surgery was consulted, but nonsurgical intervention was recommended Patient herself Does not want surgical intervention  Symptoms resolved with Dulcolax suppository, recommend taking Dulcolax suppository when necessary as needed  #2 probable aspiration pneumonia  Chest x-ray with right infrahilar opacity worrisome for atelectasis versus aspiration versus pneumonia. Patient had emesis over several hours. Patient is currently afebrile. Leukocytosis, she was empirically placed on IV  Zosyn. No clinical symptoms of pneumonia. She does have a small area of redness left lower calf, concern for cellulitis, therefore the patient will be prescribed doxycycline by mouth for a week  #3 hypokalemia  Repleted  #4 hypothyroidism  Continue Synthroid  #5 A. fib with RVR  Continue Cardizem for rate control, Continue Pradaxa for anticoagulation She received IV heparin during this hospitalization instead of pradaxa  #6 Diabetes mellitus type 2  Hemoglobin A1c of 5.8   #7 hypertension  IV Lopressor.  #8 obstructive sleep apnea  #9 hyperlipidemia    Discharge Exam:   Blood pressure 118/76, pulse 95, temperature 98 F (36.7 C), temperature source Oral, resp. rate 18, height 5\' 2"  (1.575 m), weight 105.235 kg (232 lb), SpO2 98.00%. Eyes: Conjunctivae are normal. Pupils are equal, round, and reactive to light. No scleral icterus.  Neck: Neck supple. No tracheal deviation present.  Cardiovascular: Normal rate, regular rhythm, normal heart sounds and intact distal pulses.  Pulmonary/Chest: Effort normal and breath sounds normal. No respiratory distress.  Abdominal: Soft. Normal appearance and bowel sounds are normal. She exhibits no distension. There is no tenderness.  Musculoskeletal: She exhibits no edema and no tenderness.  Neurological: She is alert. No cranial nerve deficit         Signed: Sven Pinheiro 11/14/2012, 10:43 AM

## 2012-11-14 NOTE — Progress Notes (Signed)
Occupational Therapy Treatment Patient Details Name: [name] MRN: [id number] DOB: Oct 26, 1919 Today's Date: 11/14/2012 Time: 4098-1191 OT Time Calculation (min): 23 min  OT Assessment / Plan / Recommendation Comments on Treatment Session This 79 yo female making slow progress (increase fluid retention and noted mild DOE). Will benefit from [facility] to address both ADLs and IADLs.    Follow Up Recommendations  Home health OT       Equipment Recommendations  None recommended by OT       Frequency Min 2X/week   Plan Discharge plan needs to be updated    Precautions / Restrictions Precautions Precautions: Fall Restrictions Weight Bearing Restrictions: No       ADL  Grooming: Performed;Wash/dry hands;Brushing hair;Supervision/safety Where Assessed - Grooming: Unsupported standing Toilet Transfer: Radiographer, therapeutic Method: Sit to Barista: Regular height toilet;Grab bars Toileting - Clothing Manipulation and Hygiene: Performed;Supervision/safety Where Assessed - Engineer, mining and Hygiene: Standing Equipment Used: Rolling walker ADL Comments: Informed pt and daughter that they could get a sock aid in our gift shop for $5.99 (cheapest anywhere) and asked pt if she would like a refresher on how it works--she said, "No, it has instructions with it doesn't it" I told her it does.       OT Goals ADL Goals ADL Goal: Grooming - Progress: Progressing toward goals  Visit Information  Last OT Received On: 11/14/12 Assistance Needed: +1    Subjective Data  Subjective: My daughter is coming at 3:00 to get me      Cognition  Cognition Arousal/Alertness: Awake/alert Behavior During Therapy: WFL for tasks assessed/performed Overall Cognitive Status: Within Functional Limits for tasks assessed    Mobility  Bed Mobility Details for Bed Mobility Assistance: Pt in recliner upon arrival Transfers Transfers: Sit to  Stand;Stand to Sit Sit to Stand: 5: Supervision;With upper extremity assist;With armrests;From chair/3-in-1 Stand to Sit: 5: Supervision;With upper extremity assist;With armrests;To chair/3-in-1          End of Session OT - End of Session Activity Tolerance: Patient tolerated treatment well Patient left: in chair;with call bell/phone within reach;with family/visitor present Nurse Communication:  (Pt asking about PICC line coming out)       [name] [phone] 11/14/2012, 3:05 PM

## 2012-11-14 NOTE — Progress Notes (Signed)
ANTICOAGULATION CONSULT NOTE - Follow Up Consult  Pharmacy Consult for UFH Indication: atrial fibrillation  Labs:  Recent Labs  11/11/12 2019 11/12/12 0500 11/12/12 1339 11/13/12 0440 11/13/12 1350 11/14/12 0615  HGB 14.4 14.7  --  14.8  --  13.2  HCT 42.2 44.3  --  45.1  --  40.8  PLT 164 184  --  155  --  146*  HEPARINUNFRC 0.10* <0.10* 0.28* 0.48  --  0.41  CREATININE 0.79 0.78  --   --   --  0.73  TROPONINI  --   --   --   --  <0.30  --     Estimated Creatinine Clearance: 52.1 ml/min (by C-G formula based on Cr of 0.73).  Assessment: 79 y/o female patient admitted with n/v and recurrent SBO, on pradaxa pta for h/o afib. Now receiving IV heparin while pradaxa is being held. Heparin continues to be at goal at 0.4 on 1600 units/hr. No problem with line / infusion and no bleeding per RN.    Patient is declining any surgical interventions at this time.  Possible aspiration pneumonia - patient continues on day 4 of zosyn. No fevers have been noted, wbc continues to be normal. No dose adjustments with current renal function.  5/13 urine - 50k colonies/ml - no significant growth.  Goal of Therapy:  Heparin level 0.3-0.7 units/ml Monitor platelets by anticoagulation protocol: Yes   Plan:  1. Continue IV heparin to 1600 units/hr.  2. Daily HL/CBC 3. Follow up restart of pradaxa 4. Continue extended interval dosing of zosyn for now  Sheppard Coil PharmD., BCPS Clinical Pharmacist Pager [phone] 11/14/2012 8:07 AM

## 2012-11-14 NOTE — Progress Notes (Signed)
Physical Therapy Treatment Patient Details Name: [name] MRN: [id number] DOB: 1920-02-11 Today's Date: 11/14/2012 Time: 9147-8295 PT Time Calculation (min): 26 min  PT Assessment / Plan / Recommendation Comments on Treatment Session  Educated pt on how to safely and slowly build up her endurance upon discharge.    Follow Up Recommendations  Home health PT;Supervision/Assistance - 24 hour     Does the patient have the potential to tolerate intense rehabilitation     Barriers to Discharge        Equipment Recommendations  None recommended by PT    Recommendations for Other Services    Frequency Min 3X/week   Plan Discharge plan remains appropriate    Precautions / Restrictions Precautions Precautions: Fall Restrictions Weight Bearing Restrictions: No   Pertinent Vitals/Pain     Mobility  Transfers Transfers: Sit to Stand;Stand to Sit Sit to Stand: 4: Min assist;From chair/3-in-1;With upper extremity assist (Pt has lift chair at her home) Stand to Sit: 4: Min guard;With upper extremity assist;To chair/3-in-1 Ambulation/Gait Ambulation/Gait Assistance: 4: Min guard Ambulation Distance (Feet): 130 Feet Assistive device: Rolling walker Ambulation/Gait Assistance Details: Pt required cues to stand tall and for breathing.  Pt cued to not talk while walking and to take standing deep breathing breaks.   Gait Pattern: Step-through pattern;Decreased stride length;Wide base of support Stairs: No    Exercises     PT Diagnosis:    PT Problem List:   PT Treatment Interventions:     PT Goals Acute Rehab PT Goals PT Goal: Sit to Stand - Progress: Progressing toward goal PT Goal: Ambulate - Progress: Progressing toward goal PT Goal: Perform Home Exercise Program - Progress: Met  Visit Information  Last PT Received On: 11/14/12 Assistance Needed: +1    Subjective Data  Subjective: I need to be walking more   Cognition  Cognition Arousal/Alertness:  Awake/alert Behavior During Therapy: WFL for tasks assessed/performed Overall Cognitive Status: Within Functional Limits for tasks assessed    Balance     End of Session PT - End of Session Equipment Utilized During Treatment: Gait belt Activity Tolerance: Patient limited by fatigue Patient left: in chair;with call bell/phone within reach Nurse Communication: Mobility status   GP     Judson Roch 11/14/2012, 11:24 AM  11/14/2012   Ranae Palms, PT

## 2012-11-14 NOTE — Care Management Note (Signed)
    Page 1 of 1   11/14/2012     3:19:43 PM   CARE MANAGEMENT NOTE 11/14/2012  Patient:  BRANDA, CHAUDHARY   Account Number:  [id number]  Date Initiated:  11/14/2012  Documentation initiated by:  Oletta Cohn  Subjective/Objective Assessment:   79 yo female Chief Complaint: NAUSEA/ VOMMITTING     Action/Plan:   NPO, advance diet/ home with HHPT and OT   Anticipated DC Date:  11/14/2012   Anticipated DC Plan:  HOME W HOME HEALTH SERVICES      DC Planning Services  CM consult      [facility] Choice  HOME HEALTH   Choice offered to / List presented to:  C-1 Patient        HH arranged  HH-2 PT  HH-3 OT      [facility] agency  [facility]   Status of service:  Completed, signed off Medicare Important Message given?   (If response is "NO", the following Medicare IM given date fields will be blank) Date Medicare IM given:   Date Additional Medicare IM given:    Discharge Disposition:    Per UR Regulation:  Reviewed for med. necessity/level of care/duration of stay  If discussed at Long Length of Stay Meetings, dates discussed:    Comments:  11/14/12 @ 1500.Marland KitchenMarland KitchenOletta Cohn, RN, BSN, [employer] [phone] Spoke with pt regarding discharge planning.  Offered pt [employer] [facility] [employer] agencies.  Pt chose [facility] to receive PT and OT services from. Kizzie Furnish of [facility] notified.

## 2012-11-14 NOTE — Progress Notes (Signed)
Discharged per W/C with all belongings. Accompanied by nurse tech. Other daughter called and wanted to wait down at the entrance for her mother ( pt. ) with the car

## 2012-11-14 NOTE — Progress Notes (Signed)
Went over all discharge instructions with pt and daughter.

## 2012-11-18 DIAGNOSIS — D493 Neoplasm of unspecified behavior of breast: Secondary | ICD-10-CM | POA: Diagnosis not present

## 2012-11-18 DIAGNOSIS — D4959 Neoplasm of unspecified behavior of other genitourinary organ: Secondary | ICD-10-CM | POA: Diagnosis not present

## 2012-11-18 DIAGNOSIS — I4891 Unspecified atrial fibrillation: Secondary | ICD-10-CM | POA: Diagnosis not present

## 2012-11-18 DIAGNOSIS — I509 Heart failure, unspecified: Secondary | ICD-10-CM | POA: Diagnosis not present

## 2012-11-18 DIAGNOSIS — I5032 Chronic diastolic (congestive) heart failure: Secondary | ICD-10-CM | POA: Diagnosis not present

## 2012-11-18 DIAGNOSIS — D4989 Neoplasm of unspecified behavior of other specified sites: Secondary | ICD-10-CM | POA: Diagnosis not present

## 2012-11-18 DIAGNOSIS — IMO0001 Reserved for inherently not codable concepts without codable children: Secondary | ICD-10-CM | POA: Diagnosis not present

## 2012-11-18 DIAGNOSIS — E119 Type 2 diabetes mellitus without complications: Secondary | ICD-10-CM | POA: Diagnosis not present

## 2012-11-18 DIAGNOSIS — Z8701 Personal history of pneumonia (recurrent): Secondary | ICD-10-CM | POA: Diagnosis not present

## 2012-11-19 DIAGNOSIS — I4891 Unspecified atrial fibrillation: Secondary | ICD-10-CM | POA: Diagnosis not present

## 2012-11-19 DIAGNOSIS — E039 Hypothyroidism, unspecified: Secondary | ICD-10-CM | POA: Diagnosis not present

## 2012-11-19 DIAGNOSIS — I509 Heart failure, unspecified: Secondary | ICD-10-CM | POA: Diagnosis not present

## 2012-11-19 DIAGNOSIS — I10 Essential (primary) hypertension: Secondary | ICD-10-CM | POA: Diagnosis not present

## 2012-11-19 DIAGNOSIS — E118 Type 2 diabetes mellitus with unspecified complications: Secondary | ICD-10-CM | POA: Diagnosis not present

## 2012-11-19 DIAGNOSIS — K56609 Unspecified intestinal obstruction, unspecified as to partial versus complete obstruction: Secondary | ICD-10-CM | POA: Diagnosis not present

## 2012-11-26 ENCOUNTER — Ambulatory Visit: Payer: Medicare Other | Admitting: Nurse Practitioner

## 2012-11-27 ENCOUNTER — Telehealth: Payer: Self-pay | Admitting: Internal Medicine

## 2012-11-27 NOTE — Telephone Encounter (Signed)
New problem    Pt was watching tv and states there's a recall on pradaxa and she wants to know about that before she orders more

## 2012-11-27 NOTE — Telephone Encounter (Signed)
Spoke with Kennon Rounds, she has not heard of any recall.  I called the patient back.  She is going to try and see if she can find more out about the recall she thinks it was [place] she saw it on.  She will call me back

## 2012-11-29 DIAGNOSIS — E669 Obesity, unspecified: Secondary | ICD-10-CM | POA: Diagnosis not present

## 2012-11-29 DIAGNOSIS — M25569 Pain in unspecified knee: Secondary | ICD-10-CM | POA: Diagnosis not present

## 2012-11-29 DIAGNOSIS — S51809A Unspecified open wound of unspecified forearm, initial encounter: Secondary | ICD-10-CM | POA: Diagnosis not present

## 2012-11-29 DIAGNOSIS — S8000XA Contusion of unspecified knee, initial encounter: Secondary | ICD-10-CM | POA: Diagnosis not present

## 2012-11-29 DIAGNOSIS — S8990XA Unspecified injury of unspecified lower leg, initial encounter: Secondary | ICD-10-CM | POA: Diagnosis not present

## 2012-11-29 DIAGNOSIS — S99919A Unspecified injury of unspecified ankle, initial encounter: Secondary | ICD-10-CM | POA: Diagnosis not present

## 2012-11-29 DIAGNOSIS — IMO0002 Reserved for concepts with insufficient information to code with codable children: Secondary | ICD-10-CM | POA: Diagnosis not present

## 2012-11-29 DIAGNOSIS — Z87891 Personal history of nicotine dependence: Secondary | ICD-10-CM | POA: Diagnosis not present

## 2012-11-29 DIAGNOSIS — W1789XA Other fall from one level to another, initial encounter: Secondary | ICD-10-CM | POA: Diagnosis not present

## 2012-11-29 DIAGNOSIS — T07XXXA Unspecified multiple injuries, initial encounter: Secondary | ICD-10-CM | POA: Diagnosis not present

## 2012-12-03 ENCOUNTER — Ambulatory Visit: Payer: Medicare Other | Admitting: Nurse Practitioner

## 2012-12-03 DIAGNOSIS — L02419 Cutaneous abscess of limb, unspecified: Secondary | ICD-10-CM | POA: Diagnosis not present

## 2012-12-05 ENCOUNTER — Ambulatory Visit: Payer: Medicare Other | Admitting: Nurse Practitioner

## 2012-12-08 ENCOUNTER — Inpatient Hospital Stay (HOSPITAL_COMMUNITY): Payer: Medicare Other

## 2012-12-08 ENCOUNTER — Encounter (HOSPITAL_COMMUNITY): Payer: Self-pay | Admitting: General Practice

## 2012-12-08 ENCOUNTER — Inpatient Hospital Stay (HOSPITAL_COMMUNITY)
Admission: AD | Admit: 2012-12-08 | Discharge: 2012-12-11 | DRG: 605 | Disposition: A | Discharge status: Skilled Nursing Facility | Payer: Medicare Other | Source: Ambulatory Visit | Attending: Internal Medicine | Admitting: Internal Medicine

## 2012-12-08 DIAGNOSIS — R262 Difficulty in walking, not elsewhere classified: Secondary | ICD-10-CM | POA: Diagnosis not present

## 2012-12-08 DIAGNOSIS — T148XXA Other injury of unspecified body region, initial encounter: Secondary | ICD-10-CM | POA: Diagnosis not present

## 2012-12-08 DIAGNOSIS — R279 Unspecified lack of coordination: Secondary | ICD-10-CM | POA: Diagnosis not present

## 2012-12-08 DIAGNOSIS — I621 Nontraumatic extradural hemorrhage: Secondary | ICD-10-CM | POA: Diagnosis not present

## 2012-12-08 DIAGNOSIS — I872 Venous insufficiency (chronic) (peripheral): Secondary | ICD-10-CM

## 2012-12-08 DIAGNOSIS — M79609 Pain in unspecified limb: Secondary | ICD-10-CM | POA: Diagnosis not present

## 2012-12-08 DIAGNOSIS — Z8551 Personal history of malignant neoplasm of bladder: Secondary | ICD-10-CM | POA: Diagnosis not present

## 2012-12-08 DIAGNOSIS — Z87891 Personal history of nicotine dependence: Secondary | ICD-10-CM

## 2012-12-08 DIAGNOSIS — L02419 Cutaneous abscess of limb, unspecified: Secondary | ICD-10-CM

## 2012-12-08 DIAGNOSIS — E785 Hyperlipidemia, unspecified: Secondary | ICD-10-CM | POA: Diagnosis present

## 2012-12-08 DIAGNOSIS — Z90710 Acquired absence of both cervix and uterus: Secondary | ICD-10-CM | POA: Diagnosis not present

## 2012-12-08 DIAGNOSIS — Z853 Personal history of malignant neoplasm of breast: Secondary | ICD-10-CM

## 2012-12-08 DIAGNOSIS — E039 Hypothyroidism, unspecified: Secondary | ICD-10-CM | POA: Diagnosis present

## 2012-12-08 DIAGNOSIS — G4733 Obstructive sleep apnea (adult) (pediatric): Secondary | ICD-10-CM | POA: Diagnosis present

## 2012-12-08 DIAGNOSIS — I4821 Permanent atrial fibrillation: Secondary | ICD-10-CM | POA: Diagnosis present

## 2012-12-08 DIAGNOSIS — I4891 Unspecified atrial fibrillation: Secondary | ICD-10-CM | POA: Diagnosis present

## 2012-12-08 DIAGNOSIS — I5032 Chronic diastolic (congestive) heart failure: Secondary | ICD-10-CM | POA: Diagnosis present

## 2012-12-08 DIAGNOSIS — Z7901 Long term (current) use of anticoagulants: Secondary | ICD-10-CM | POA: Diagnosis not present

## 2012-12-08 DIAGNOSIS — Z5189 Encounter for other specified aftercare: Secondary | ICD-10-CM | POA: Diagnosis not present

## 2012-12-08 DIAGNOSIS — Z8541 Personal history of malignant neoplasm of cervix uteri: Secondary | ICD-10-CM | POA: Diagnosis not present

## 2012-12-08 DIAGNOSIS — S8010XA Contusion of unspecified lower leg, initial encounter: Secondary | ICD-10-CM | POA: Diagnosis not present

## 2012-12-08 DIAGNOSIS — I5043 Acute on chronic combined systolic (congestive) and diastolic (congestive) heart failure: Secondary | ICD-10-CM | POA: Diagnosis present

## 2012-12-08 DIAGNOSIS — E119 Type 2 diabetes mellitus without complications: Secondary | ICD-10-CM | POA: Diagnosis not present

## 2012-12-08 DIAGNOSIS — L03115 Cellulitis of right lower limb: Secondary | ICD-10-CM

## 2012-12-08 DIAGNOSIS — L03119 Cellulitis of unspecified part of limb: Secondary | ICD-10-CM | POA: Diagnosis not present

## 2012-12-08 DIAGNOSIS — I509 Heart failure, unspecified: Secondary | ICD-10-CM | POA: Diagnosis present

## 2012-12-08 DIAGNOSIS — W19XXXA Unspecified fall, initial encounter: Secondary | ICD-10-CM | POA: Diagnosis present

## 2012-12-08 DIAGNOSIS — J189 Pneumonia, unspecified organism: Secondary | ICD-10-CM

## 2012-12-08 DIAGNOSIS — Z79899 Other long term (current) drug therapy: Secondary | ICD-10-CM | POA: Diagnosis not present

## 2012-12-08 DIAGNOSIS — S7010XA Contusion of unspecified thigh, initial encounter: Secondary | ICD-10-CM | POA: Diagnosis not present

## 2012-12-08 DIAGNOSIS — Z9010 Acquired absence of unspecified breast and nipple: Secondary | ICD-10-CM

## 2012-12-08 DIAGNOSIS — Z8543 Personal history of malignant neoplasm of ovary: Secondary | ICD-10-CM

## 2012-12-08 DIAGNOSIS — L0291 Cutaneous abscess, unspecified: Secondary | ICD-10-CM | POA: Diagnosis not present

## 2012-12-08 DIAGNOSIS — M7040 Prepatellar bursitis, unspecified knee: Secondary | ICD-10-CM | POA: Diagnosis not present

## 2012-12-08 DIAGNOSIS — R29818 Other symptoms and signs involving the nervous system: Secondary | ICD-10-CM | POA: Diagnosis not present

## 2012-12-08 DIAGNOSIS — M25469 Effusion, unspecified knee: Secondary | ICD-10-CM | POA: Diagnosis not present

## 2012-12-08 LAB — CBC
HCT: 35 % — ABNORMAL LOW (ref 36.0–46.0)
Hemoglobin: 11.5 g/dL — ABNORMAL LOW (ref 12.0–15.0)
MCH: 31.8 pg (ref 26.0–34.0)
MCHC: 32.9 g/dL (ref 30.0–36.0)
MCV: 96.7 fL (ref 78.0–100.0)
RDW: 16.8 % — ABNORMAL HIGH (ref 11.5–15.5)

## 2012-12-08 LAB — BASIC METABOLIC PANEL
BUN: 17 mg/dL (ref 6–23)
Creatinine, Ser: 0.96 mg/dL (ref 0.50–1.10)
GFR calc Af Amer: 58 mL/min — ABNORMAL LOW (ref >90)
GFR calc non Af Amer: 50 mL/min — ABNORMAL LOW (ref >90)
Glucose, Bld: 123 mg/dL — ABNORMAL HIGH (ref 70–99)

## 2012-12-08 LAB — HEMOGLOBIN A1C: Mean Plasma Glucose: 117 mg/dL — ABNORMAL HIGH (ref <117)

## 2012-12-08 MED ORDER — LIDOCAINE HCL (PF) 1 % IJ SOLN: 5.0000 mL | Freq: Once | INTRAMUSCULAR | Status: DC

## 2012-12-08 MED ORDER — BISACODYL 10 MG RE SUPP: 10.0000 mg | RECTAL | Status: DC | PRN

## 2012-12-08 MED ORDER — INSULIN ASPART 100 UNIT/ML ~~LOC~~ SOLN
0.0000 [IU] | Freq: Three times a day (TID) | SUBCUTANEOUS | Status: DC
  Administered 2012-12-09: 2 [IU] via SUBCUTANEOUS

## 2012-12-08 MED ORDER — HYDROCODONE-ACETAMINOPHEN 5-325 MG PO TABS: 1.0000 | ORAL_TABLET | ORAL | Status: DC | PRN

## 2012-12-08 MED ORDER — LIDOCAINE HCL (PF) 1 % IJ SOLN
5.0000 mL | Freq: Once | INTRAMUSCULAR | Status: DC
  Filled 2012-12-08: qty 5

## 2012-12-08 MED ORDER — ONDANSETRON HCL 4 MG/2ML IJ SOLN: 4.0000 mg | Freq: Four times a day (QID) | INTRAMUSCULAR | Status: DC | PRN

## 2012-12-08 MED ORDER — DIPHENHYDRAMINE HCL 12.5 MG/5ML PO ELIX
12.5000 mg | ORAL_SOLUTION | Freq: Once | ORAL | Status: AC
  Administered 2012-12-09: 12.5 mg via ORAL
  Filled 2012-12-08: qty 5

## 2012-12-08 MED ORDER — FERROUS SULFATE 325 (65 FE) MG PO TABS
325.0000 mg | ORAL_TABLET | Freq: Two times a day (BID) | ORAL | Status: DC
  Administered 2012-12-08 – 2012-12-11 (×6): 325 mg via ORAL
  Filled 2012-12-08 (×8): qty 1

## 2012-12-08 MED ORDER — LEVOTHYROXINE SODIUM 200 MCG PO TABS
200.0000 ug | ORAL_TABLET | Freq: Every day | ORAL | Status: DC
  Administered 2012-12-09 – 2012-12-11 (×3): 200 ug via ORAL
  Filled 2012-12-08 (×4): qty 1

## 2012-12-08 MED ORDER — SODIUM CHLORIDE 0.9 % IJ SOLN
3.0000 mL | Freq: Two times a day (BID) | INTRAMUSCULAR | Status: DC
  Administered 2012-12-08 – 2012-12-10 (×3): 3 mL via INTRAVENOUS

## 2012-12-08 MED ORDER — INSULIN ASPART 100 UNIT/ML ~~LOC~~ SOLN: 0.0000 [IU] | Freq: Every day | SUBCUTANEOUS | Status: DC

## 2012-12-08 MED ORDER — ACETAMINOPHEN 325 MG PO TABS: 650.0000 mg | ORAL_TABLET | Freq: Four times a day (QID) | ORAL | Status: DC | PRN

## 2012-12-08 MED ORDER — ONDANSETRON HCL 4 MG PO TABS: 4.0000 mg | ORAL_TABLET | Freq: Four times a day (QID) | ORAL | Status: DC | PRN

## 2012-12-08 MED ORDER — POTASSIUM CHLORIDE CRYS ER 20 MEQ PO TBCR
20.0000 meq | EXTENDED_RELEASE_TABLET | Freq: Every day | ORAL | Status: DC
  Administered 2012-12-08 – 2012-12-11 (×4): 20 meq via ORAL
  Filled 2012-12-08 (×4): qty 1

## 2012-12-08 MED ORDER — VANCOMYCIN HCL IN DEXTROSE 1-5 GM/200ML-% IV SOLN
1000.0000 mg | INTRAVENOUS | Status: DC
  Administered 2012-12-08 – 2012-12-10 (×3): 1000 mg via INTRAVENOUS
  Filled 2012-12-08 (×4): qty 200

## 2012-12-08 MED ORDER — FUROSEMIDE 80 MG PO TABS
80.0000 mg | ORAL_TABLET | Freq: Two times a day (BID) | ORAL | Status: DC
  Administered 2012-12-08 – 2012-12-09 (×2): 80 mg via ORAL
  Filled 2012-12-08 (×4): qty 1

## 2012-12-08 MED ORDER — DILTIAZEM HCL ER COATED BEADS 360 MG PO CP24
360.0000 mg | ORAL_CAPSULE | Freq: Every day | ORAL | Status: DC
  Administered 2012-12-08 – 2012-12-11 (×2): 360 mg via ORAL
  Filled 2012-12-08 (×4): qty 1

## 2012-12-08 MED ORDER — ACETAMINOPHEN 650 MG RE SUPP: 650.0000 mg | Freq: Four times a day (QID) | RECTAL | Status: DC | PRN

## 2012-12-08 MED ORDER — BISACODYL 5 MG PO TBEC
5.0000 mg | DELAYED_RELEASE_TABLET | Freq: Two times a day (BID) | ORAL | Status: DC
  Administered 2012-12-08 – 2012-12-11 (×5): 5 mg via ORAL
  Filled 2012-12-08 (×8): qty 1

## 2012-12-08 MED ORDER — HYDROMORPHONE HCL PF 1 MG/ML IJ SOLN: 1.0000 mg | INTRAMUSCULAR | Status: DC | PRN

## 2012-12-08 NOTE — Progress Notes (Signed)
NURSING PROGRESS NOTE  BRITNI DRISCOLL [id number] Admission Data: 12/08/2012 5:19 PM Attending Provider: Cathren Harsh, MD WUJ:[id number],NWGNF Juel Burrow, MD Code Status: full   [name] is a 79 y.o. female patient admitted from ED:  -No acute distress noted.  -No complaints of shortness of breath.  -No complaints of chest pain.   Cardiac Monitoring: Box # 5506 in place. Cardiac monitor yields:to be read.  There were no vitals taken for this visit.   IV Fluids:  IV in place, occlusive dsg intact without redness, IV cath none none.   Allergies:  Codeine; Morphine and related; and Statins  Past Medical History:   has a past medical history of DM2 (diabetes mellitus, type 2); Hypertension; SBO (small bowel obstruction); Colon cancer; Permanent atrial fibrillation; Chronic diastolic heart failure; Venous insufficiency; HLD (hyperlipidemia); OSA (obstructive sleep apnea); Breast cancer; Cervical cancer; Ovarian cancer; Bladder cancer; Hypothyroidism; Diabetes mellitus (11/11/2012); SBO (11/11/2012); Diastolic CHF, chronic (11/11/2012); Hyperlipidemia (11/11/2012); breast cancer (11/11/2012); cervical cancer (11/11/2012); H/O ovarian cancer (11/11/2012); bladder cancer (11/11/2012); S/P hysterectomy (11/11/2012); and Small bowel obstruction (11/11/2012).  Past Surgical History:   has past surgical history that includes Abdominal hysterectomy; Mastectomy, radical (Left); Breast lumpectomy (Right); and Bladder surgery.  Social History:   reports that she quit smoking about 46 years ago. She has never used smokeless tobacco. She reports that she does not drink alcohol or use illicit drugs.  Skin: bruising to right lower leg, left arm and right leg  Patient/Family orientated to room. Information packet given to patient/family. Admission inpatient armband information verified with patient/family to include name and date of birth and placed on patient arm. Side rails up x 2, fall assessment and education  completed with patient/family. Patient/family able to verbalize understanding of risk associated with falls and verbalized understanding to call for assistance before getting out of bed. Call light within reach. Patient/family able to voice and demonstrate understanding of unit orientation instructions.    Will continue to evaluate and treat per MD orders.   Madelin Rear, MSN, RN, [employer]

## 2012-12-08 NOTE — Progress Notes (Signed)
  CT of the right tib-fib done today reviewed showed  IMPRESSION:  Large prepatellar hematoma. The superior aspect of this is not  imaged.  Subcutaneous edema with reticulation of fat planes surrounding the  calf but without deep drainable abscess or hematoma.  - Called orthopedics consult to Dr Luiz Blare who will evaluate the patient. Patient had been ON PRADAXA since May 31 the day of the fall to this morning.    Adalin Vanderploeg M.D. [facility] 12/08/2012, 6:53 PM  Pager: [phone]

## 2012-12-08 NOTE — H&P (Signed)
History and Physical       Hospital Admission Note Date: 12/08/2012  Patient name: [name] Medical record number: [id number] Date of birth: August 16, 1919 Age: 79 y.o. Gender: female PCP: [name], MD    Chief Complaint:  Worsening of the right lower extremity pain and redness in the last 1 week  HPI: Patient is a 79 year old female with history of atrial fibrillation on pradaxa, chronic diastolic CHF, diabetes, hyperlipidemia, obstructive sleep apnea, recurrent SBO present it is cracked admit from her PCPs office for worsening of the right lower extremity pain and redness in the last 1 week. History was obtained from the patient in her daughter present in the room.  Per patient, on May 31st, patient was at [facility] theater when she tripped on a rug. She fell on her right knee and has a nickel size abrasion. Patient went to the ER but was found to have no fractures or dislocations and was DC'd home. Patient noticed that her knee was getting more darker and purplish hands she saw another physician in her PCPs office on 12/03/12 and was placed on Levaquin. Patient had now developed a dark eschar on her knee and her leg was getting tight, had pain on bearing weight today. She saw her PCP Dr. Clarene Duke today and was sent to the hospital as a direct admit for failed outpatient treatment of cellulitis. Patient has continued to take her Pradaxa until this morning.  Review of Systems:  Constitutional: Denies fever, chills, diaphoresis, poor appetite and fatigue.  HEENT: Denies photophobia, eye pain, redness, hearing loss, ear pain, congestion, sore throat, rhinorrhea, sneezing, mouth sores, trouble swallowing, neck pain, neck stiffness and tinnitus.   Respiratory: Denies SOB, DOE, cough, chest tightness,  and wheezing.   Cardiovascular: Denies chest pain, palpitations and leg swelling.  Gastrointestinal: Denies nausea, vomiting, abdominal  pain, diarrhea, constipation, blood in stool and abdominal distention.  Genitourinary: Denies dysuria, urgency, frequency, hematuria, flank pain and difficulty urinating.  she also has a history of recurrent SBO's Musculoskeletal: Please see history of present illness  Skin: Patient has a large dark eschar on her knee with purplish discoloration on the right leg worse in the dependent areas Neurological: Denies dizziness, seizures, syncope, weakness, light-headedness, numbness and headaches.  Hematological: Denies adenopathy. Please see history of present illness  Psychiatric/Behavioral: Denies suicidal ideation, mood changes, confusion, nervousness, sleep disturbance and agitation  Past Medical History: Past Medical History  Diagnosis Date  . DM2 (diabetes mellitus, type 2)   . Hypertension   . SBO (small bowel obstruction)   . Colon cancer   . Permanent atrial fibrillation     a. coumadin d/c'd => Pradaxa in 05/2012  . Chronic diastolic heart failure     a. Echo 5/12: Mild LVH, EF 55-60%, mild AI, mild MR, severe LAE, mild RAE, PASP 31, small pericardial effusion  . Venous insufficiency     chronic LE edema  . HLD (hyperlipidemia)   . OSA (obstructive sleep apnea)   . Breast cancer   . Cervical cancer   . Ovarian cancer   . Bladder cancer   . Hypothyroidism   . Diabetes mellitus 11/11/2012  . Hx SBO 11/11/2012  . Diastolic CHF, chronic 11/11/2012    Class 2b-3 2 d echo 5/12 mild LVH EF 55-50%, MILD AI, MILD MR, SEVERE LAE, mild RAE, PASP 31, SMALL PERICRADIAL EFFUSION  . Hyperlipidemia 11/11/2012  . HX: breast cancer 11/11/2012    S/P L MASTECTOMY AND R BREAST LUMPECTOMY APPROX  35 YRS AGO  . Hx of cervical cancer 11/11/2012  . H/O ovarian cancer 11/11/2012     MUCINOUS CYST S/P RESECTION 35 YEARS AGO  . Hx of bladder cancer 11/11/2012  . S/P hysterectomy 11/11/2012  . Small bowel obstruction 11/11/2012   Past Surgical History  Procedure Laterality Date  . Abdominal hysterectomy     . Mastectomy, radical Left   . Breast lumpectomy Right   . Bladder surgery      Medications: Prior to Admission medications   Medication Sig Start Date End Date Taking? Authorizing Provider  bisacodyl (DULCOLAX) 10 MG suppository Place 1 suppository (10 mg total) rectally as needed for constipation. 11/14/12   Richarda Overlie, MD  dabigatran (PRADAXA) 150 MG CAPS Take 1 capsule (150 mg total) by mouth every 12 (twelve) hours. 05/23/12   Hillis Range, MD  diltiazem (CARDIZEM CD) 360 MG 24 hr capsule Take 1 capsule (360 mg total) by mouth daily. 02/26/11   Dolores Patty, MD  ferrous sulfate 325 (65 FE) MG tablet Take 325 mg by mouth 2 (two) times daily.    Historical Provider, MD  furosemide (LASIX) 40 MG tablet Take 80 mg by mouth 2 (two) times daily.    Historical Provider, MD  levothyroxine (SYNTHROID, LEVOTHROID) 200 MCG tablet Take 200 mcg by mouth every morning.      Historical Provider, MD  potassium chloride SA (K-DUR,KLOR-CON) 20 MEQ tablet Take 1 tablet (20 mEq total) by mouth daily. 09/25/12   Hillis Range, MD  ramipril (ALTACE) 10 MG capsule Take 10 mg by mouth every morning.      Historical Provider, MD    Allergies:   Allergies  Allergen Reactions  . Codeine     unknown  . Morphine And Related     sick  . Statins     sick    Social History:  reports that she quit smoking about 46 years ago. She has never used smokeless tobacco. She reports that she does not drink alcohol or use illicit drugs. She ambulates with a walker  Family History: No family history on file.  Physical Exam: There were no vitals taken for this visit. General: Alert, awake, oriented x3, in no acute distress. HEENT: normocephalic, atraumatic, anicteric sclera, pink conjunctiva, pupils equal and reactive to light and accomodation, oropharynx clear Neck: supple, no masses or lymphadenopathy, no goiter, no bruits  Heart: Irregularly irregular . Lungs: Clear to auscultation bilaterally, no  wheezing, rales or rhonchi. Abdomen: Soft, nontender, ventral hernia  positive bowel sounds, no masses. Extremities: No clubbing, cyanosis.Right lower extremity is very edematous compared to left, black eschar on the right knee with the diffuse purplish discoloration worse on the dependent areas, tight, warm to touch, tender  Neuro: Grossly intact, no focal neurological deficits, strength 5/5 upper and lower extremities bilaterally Psych: alert and oriented x 3, normal mood and affect Skin:  a.c. extremity examination  LABS on Admission:  Basic Metabolic Panel: No results found for this basename: NA, K, CL, CO2, GLUCOSE, BUN, CREATININE, CALCIUM, MG, PHOS,  in the last 168 hours Liver Function Tests: No results found for this basename: AST, ALT, ALKPHOS, BILITOT, PROT, ALBUMIN,  in the last 168 hours No results found for this basename: LIPASE, AMYLASE,  in the last 168 hours No results found for this basename: AMMONIA,  in the last 168 hours CBC: No results found for this basename: WBC, NEUTROABS, HGB, HCT, MCV, PLT,  in the last 168 hours Cardiac Enzymes: No results  found for this basename: CKTOTAL, CKMB, CKMBINDEX, TROPONINI,  in the last 168 hours BNP: No components found with this basename: POCBNP,  CBG: No results found for this basename: GLUCAP,  in the last 168 hours   Radiological Exams on Admission: None available   Assessment/Plan Principal Problem:   Cellulitis of leg, right  - Will admit for IV antibiotics, further workup. Obtain stat CBC, BMET. - Patient has been on Pradaxa until this morning, will hold pradaxa and obtain stat CT of the right lower extremity to rule out any hematoma/abscess or any fasciitis -Patient was on levofloxacin since 12/03/12, with no improvement, place on IV vancomycin, keep right leg elevated on 2 pillows  Active Problems:   Chronic diastolic heart failure:  - Currently compensated, continue Lasix and potassium replacement    Atrial  fibrillation - patient is on Cardizem, will continue - Hold pradaxa, patient and her daughter were explained and understands the risk of TIA/CVA on holding anticoagulation with her history of atrial fibrillation. - I have discussed with Dr. Dietrich Pates who will notify Dr Johney Frame (patient's primary cardiologist) as patient is very particular about continuing pradaxa, I will defer to Dr Johney Frame for the decision when or if to resume.     Diabetes mellitus - Place on sliding scale insulin, obtain hemoglobin A1c    Hypothyroidism - Continue Synthroid   DVT prophylaxis:  SCD's   CODE STATUS:  discussed in detail with the patient, she opted to be full CODE STATUS   Further plan will depend as patient's clinical course evolves and further radiologic and laboratory data become available.   Time Spent on Admission: 1 hour  Avelino Herren M.D. [facility] 12/08/2012, 5:06 PM Pager: [phone]  If 7PM-7AM, please contact night-coverage [email or web address] Password [email or web address]

## 2012-12-08 NOTE — Progress Notes (Signed)
ANTIBIOTIC CONSULT NOTE - INITIAL  Pharmacy Consult for Vancomycin Indication: cellulitis of RLE  Allergies  Allergen Reactions  . Codeine     unknown  . Morphine And Related     sick  . Statins     sick    Patient Measurements: Height: 5\' 1"  (154.9 cm) Weight: 213 lb 10 oz (96.9 kg) IBW/kg (Calculated) : 47.8 Adjusted Body Weight:   Vital Signs: Temp: 97.7 F (36.5 C) (06/09 1700) Temp src: Oral (06/09 1700) BP: 103/69 mmHg (06/09 1700) Pulse Rate: 58 (06/09 1700) Intake/Output from previous day:   Intake/Output from this shift:    Labs:  Recent Labs  12/08/12 1829  WBC 6.8  HGB 11.5*  PLT 243  CREATININE 0.96   Estimated Creatinine Clearance: 40.6 ml/min (by C-G formula based on Cr of 0.96). No results found for this basename: VANCOTROUGH, Leodis Binet, VANCORANDOM, GENTTROUGH, GENTPEAK, GENTRANDOM, TOBRATROUGH, TOBRAPEAK, TOBRARND, AMIKACINPEAK, AMIKACINTROU, AMIKACIN,  in the last 72 hours   Microbiology: Recent Results (from the past 720 hour(s))  URINE CULTURE     Status: None   Collection Time    11/11/12  3:11 PM      Result Value Range Status   Specimen Description URINE, RANDOM   Final   Special Requests NONE   Final   Culture  Setup Time 11/11/2012 16:30   Final   Colony Count 50,000 COLONIES/ML   Final   Culture     Final   Value: Multiple bacterial morphotypes present, none predominant. Suggest appropriate recollection if clinically indicated.   Report Status 11/12/2012 FINAL   Final    Medical History: Past Medical History  Diagnosis Date  . Hypertension   . SBO (small bowel obstruction)   . Permanent atrial fibrillation     a. coumadin d/c'd => Pradaxa in 05/2012  . Chronic diastolic heart failure     a. Echo 5/12: Mild LVH, EF 55-60%, mild AI, mild MR, severe LAE, mild RAE, PASP 31, small pericardial effusion  . Venous insufficiency     chronic LE edema  . HLD (hyperlipidemia)   . OSA (obstructive sleep apnea)   . Hypothyroidism    . Diastolic CHF, chronic 11/11/2012    Class 2b-3 2 d echo 5/12 mild LVH EF 55-50%, MILD AI, MILD MR, SEVERE LAE, mild RAE, PASP 31, SMALL PERICRADIAL EFFUSION  . Hx of cervical cancer 11/11/2012  . S/P hysterectomy 11/11/2012  . Colon cancer     "polyp" (12/08/2012)  . Breast cancer   . Cervical cancer   . Ovarian cancer   . Bladder cancer   . H/O ovarian cancer 11/11/2012     MUCINOUS CYST S/P RESECTION 35 YEARS AGO  . Hx of bladder cancer 11/11/2012  . DM2 (diabetes mellitus, type 2)     Medications:  Scheduled:  . bisacodyl  5 mg Oral BID  . diltiazem  360 mg Oral Daily  . ferrous sulfate  325 mg Oral BID WC  . furosemide  80 mg Oral BID  . [START ON 12/09/2012] insulin aspart  0-15 Units Subcutaneous TID WC  . insulin aspart  0-5 Units Subcutaneous QHS  . [START ON 12/09/2012] levothyroxine  200 mcg Oral QAC breakfast  . potassium chloride SA  20 mEq Oral Daily  . sodium chloride  3 mL Intravenous Q12H  . vancomycin  1,000 mg Intravenous Q24H   Assessment: 79 yr old female who tripped an fell on her rt knee at a theater. She had it checked at  the ED but was assured nothing was fractured or dislocated. PCP put her on Levaquin on 6/4, but knee continued to get worse. She was sent to the hospital for IV antibiotics. She takes Pradaxa for a.fib.  Goal of Therapy:  Vancomycin trough level 10-15 mcg/ml  Plan:  Follow up culture results Vancomycin 1 Gm IV q24 hrs. Trough level when appropriate.  Eugene Garnet 12/08/2012,8:46 PM

## 2012-12-09 DIAGNOSIS — I4891 Unspecified atrial fibrillation: Secondary | ICD-10-CM | POA: Diagnosis not present

## 2012-12-09 DIAGNOSIS — L02419 Cutaneous abscess of limb, unspecified: Secondary | ICD-10-CM | POA: Diagnosis not present

## 2012-12-09 DIAGNOSIS — I5032 Chronic diastolic (congestive) heart failure: Secondary | ICD-10-CM | POA: Diagnosis not present

## 2012-12-09 DIAGNOSIS — E039 Hypothyroidism, unspecified: Secondary | ICD-10-CM

## 2012-12-09 DIAGNOSIS — E119 Type 2 diabetes mellitus without complications: Secondary | ICD-10-CM | POA: Diagnosis not present

## 2012-12-09 LAB — CBC
HCT: 31.8 % — ABNORMAL LOW (ref 36.0–46.0)
MCH: 31.9 pg (ref 26.0–34.0)
MCV: 96.7 fL (ref 78.0–100.0)
RDW: 17 % — ABNORMAL HIGH (ref 11.5–15.5)
WBC: 5.5 10*3/uL (ref 4.0–10.5)

## 2012-12-09 LAB — BASIC METABOLIC PANEL
CO2: 29 mEq/L (ref 19–32)
Calcium: 8.8 mg/dL (ref 8.4–10.5)
Chloride: 102 mEq/L (ref 96–112)
Creatinine, Ser: 0.89 mg/dL (ref 0.50–1.10)
Glucose, Bld: 101 mg/dL — ABNORMAL HIGH (ref 70–99)

## 2012-12-09 LAB — GLUCOSE, CAPILLARY
Glucose-Capillary: 102 mg/dL — ABNORMAL HIGH (ref 70–99)
Glucose-Capillary: 100 mg/dL — ABNORMAL HIGH (ref 70–99)
Glucose-Capillary: 124 mg/dL — ABNORMAL HIGH (ref 70–99)

## 2012-12-09 MED ORDER — ZOLPIDEM TARTRATE 5 MG PO TABS
5.0000 mg | ORAL_TABLET | Freq: Every evening | ORAL | Status: DC | PRN
  Administered 2012-12-09: 5 mg via ORAL
  Filled 2012-12-09: qty 1

## 2012-12-09 MED ORDER — FUROSEMIDE 40 MG PO TABS
40.0000 mg | ORAL_TABLET | Freq: Two times a day (BID) | ORAL | Status: DC
  Administered 2012-12-10 – 2012-12-11 (×3): 40 mg via ORAL
  Filled 2012-12-09 (×7): qty 1

## 2012-12-09 MED ORDER — DIPHENHYDRAMINE HCL 12.5 MG/5ML PO ELIX: 12.5000 mg | ORAL_SOLUTION | Freq: Every evening | ORAL | Status: DC | PRN

## 2012-12-09 NOTE — Progress Notes (Signed)
Subjective:     Patient reports pain as 3 on 0-10 scale.   Leg feels better this morning after evacuation of hematoma. Objective: Vital signs in last 24 hours: Temp:  [97.7 F (36.5 C)-98.7 F (37.1 C)] 98.7 F (37.1 C) (06/10 0502) Pulse Rate:  [58-74] 74 (06/10 0502) Resp:  [18-24] 24 (06/10 0502) BP: (103-142)/(69-74) 107/70 mmHg (06/10 0502) SpO2:  [96 %-100 %] 97 % (06/10 0502) Weight:  [96.9 kg (213 lb 10 oz)] 96.9 kg (213 lb 10 oz) (06/09 1700)  Intake/Output from previous day: 06/09 0701 - 06/10 0700 In: -  Out: 600 [Urine:600] Intake/Output this shift: Total I/O In: 240 [P.O.:240] Out: -    Recent Labs  12/08/12 1829 12/09/12 0504  HGB 11.5* 10.5*    Recent Labs  12/08/12 1829 12/09/12 0504  WBC 6.8 5.5  RBC 3.62* 3.29*  HCT 35.0* 31.8*  PLT 243 234    Recent Labs  12/08/12 1829 12/09/12 0504  NA 139 138  K 3.8 3.5  CL 100 102  CO2 30 29  BUN 17 16  CREATININE 0.96 0.89  GLUCOSE 123* 101*  CALCIUM 9.2 8.8  right leg exam: Dressing is clean and dry.  NV is intact to the foot.  Good distal pulses.  Still a little tender around the knee area.   Assessment/Plan:    status post I&D of large hematoma right leg. Plan: Out of bed today. Will change dressing tomorrow and tweak or pull the Penrose drain. May weight-bear as tolerated on the right. Will leave anticoagulation up to medicine service.   [name] G 12/09/2012, 9:55 AM

## 2012-12-09 NOTE — Progress Notes (Signed)
PATIENT DETAILS Name: [name] Age: 79 y.o. Sex: female Date of Birth: May 14, 1920 Admit Date: 12/08/2012 Admitting Physician Zannie Cove, MD WUJ:[id number] Juel Burrow, MD  Subjective: No major complaints overnight.  Admitted with worsening swelling of her right lower extremity-found to have a hematoma-that was evacuated by Ortho yesterday-currently off Pradaxa  Assessment/Plan: Principal Problem: Hematoma of leg, right -2/2 mechanical fall that she sustained approximately 2 weeks back-she also is on Pradaxa for Afib -Pradaxa held on admission-spoke with Dr Luiz Blare, from his point of view anticoagulation can be resumed, however need to assess her ambulation and fall risk before initiating anticoagulation. She wants to speak with her primary cardiologist regarding anticoagulation-have notified the [place] cardiology service.  Active Problems: Chronic diastolic heart failure:  - Currently compensated, continue Lasix and potassium replacement  Atrial fibrillation  - patient is on Cardizem, will continue -was on Pradaxa-see above -await PT eval and cards assessment as well.  Hypothyroidism  - Continue Synthroid  -TSH slightly elevated, but do not know when her synthroid dose was last adjusted-therefore will defer this to her PCP  Diabetes mellitus -CBG's stable on SSI  -A1C 5.7  Disposition: Remain inpatient  DVT Prophylaxis:  SCD's  Code Status: Full code   Family Communication None  Procedures:  I&D 6/9  CONSULTS:  orthopedic surgery   MEDICATIONS: Scheduled Meds: . bisacodyl  5 mg Oral BID  . diltiazem  360 mg Oral Daily  . ferrous sulfate  325 mg Oral BID WC  . furosemide  80 mg Oral BID  . insulin aspart  0-15 Units Subcutaneous TID WC  . insulin aspart  0-5 Units Subcutaneous QHS  . levothyroxine  200 mcg Oral QAC breakfast  . lidocaine (PF)  5 mL Intradermal Once  . potassium chloride SA  20 mEq Oral Daily  . sodium chloride  3 mL Intravenous  Q12H  . vancomycin  1,000 mg Intravenous Q24H   Continuous Infusions:  PRN Meds:.acetaminophen, acetaminophen, bisacodyl, HYDROcodone-acetaminophen, HYDROmorphone (DILAUDID) injection, ondansetron (ZOFRAN) IV, ondansetron  Antibiotics: Anti-infectives   Start     Dose/Rate Route Frequency Ordered Stop   12/08/12 2100  vancomycin (VANCOCIN) IVPB 1000 mg/200 mL premix     1,000 mg 200 mL/hr over 60 Minutes Intravenous Every 24 hours 12/08/12 2004         PHYSICAL EXAM: Vital signs in last 24 hours: Filed Vitals:   12/08/12 1700 12/08/12 2125 12/09/12 0502 12/09/12 1002  BP: 103/69 142/74 107/70 92/56  Pulse: 58 65 74   Temp: 97.7 F (36.5 C) 98.5 F (36.9 C) 98.7 F (37.1 C)   TempSrc: Oral Oral Oral   Resp: 18 18 24    Height: 5\' 1"  (1.549 m)     Weight: 96.9 kg (213 lb 10 oz)     SpO2: 96% 100% 97%     Weight change:  Filed Weights   12/08/12 1700  Weight: 96.9 kg (213 lb 10 oz)   Body mass index is 40.39 kg/(m^2).   Gen Exam: Awake and alert with clear speech.   Neck: Supple, No JVD.   Chest: B/L Clear.   CVS: S1 S2 Regular, no murmurs.  Abdomen: soft, BS +, non tender, non distended.  Extremities:B/L lower extremities warm to touch.discoloration of her right lower ext-dressing in place Neurologic: Non Focal.   Skin: No Rash.   Wounds: N/A.    Intake/Output from previous day:  Intake/Output Summary (Last 24 hours) at 12/09/12 1021 Last data filed at 12/09/12 0900  Gross per 24  hour  Intake    240 ml  Output    600 ml  Net   -360 ml     LAB RESULTS: CBC  Recent Labs Lab 12/08/12 1829 12/09/12 0504  WBC 6.8 5.5  HGB 11.5* 10.5*  HCT 35.0* 31.8*  PLT 243 234  MCV 96.7 96.7  MCH 31.8 31.9  MCHC 32.9 33.0  RDW 16.8* 17.0*    Chemistries   Recent Labs Lab 12/08/12 1829 12/09/12 0504  NA 139 138  K 3.8 3.5  CL 100 102  CO2 30 29  GLUCOSE 123* 101*  BUN 17 16  CREATININE 0.96 0.89  CALCIUM 9.2 8.8    CBG:  Recent Labs Lab  12/08/12 1809 12/08/12 2129 12/09/12 0746  GLUCAP 97 104* 102*    GFR Estimated Creatinine Clearance: 43.8 ml/min (by C-G formula based on Cr of 0.89).  Coagulation profile No results found for this basename: INR, PROTIME,  in the last 168 hours  Cardiac Enzymes No results found for this basename: CK, CKMB, TROPONINI, MYOGLOBIN,  in the last 168 hours  No components found with this basename: POCBNP,  No results found for this basename: DDIMER,  in the last 72 hours  Recent Labs  12/08/12 1829  HGBA1C 5.7*   No results found for this basename: CHOL, HDL, LDLCALC, TRIG, CHOLHDL, LDLDIRECT,  in the last 72 hours  Recent Labs  12/08/12 1829  TSH 9.538*   No results found for this basename: VITAMINB12, FOLATE, FERRITIN, TIBC, IRON, RETICCTPCT,  in the last 72 hours No results found for this basename: LIPASE, AMYLASE,  in the last 72 hours  Urine Studies No results found for this basename: UACOL, UAPR, USPG, UPH, UTP, UGL, UKET, UBIL, UHGB, UNIT, UROB, ULEU, UEPI, UWBC, URBC, UBAC, CAST, CRYS, UCOM, BILUA,  in the last 72 hours  MICROBIOLOGY: No results found for this or any previous visit (from the past 240 hour(s)).  RADIOLOGY STUDIES/RESULTS: Abd 1 View (kub)  11/12/2012   *RADIOLOGY REPORT*  Clinical Data: Small bowel obstruction.  ABDOMEN - 1 VIEW  Comparison: 11/11/2012  Findings: Less prominent small bowel dilatation with ingested oral contrast for CT now present in the colon.  Findings are consistent with resolving partial small bowel obstruction/ileus.  IMPRESSION: Improved appearance with resolving small bowel obstruction/ileus.   Original Report Authenticated By: Irish Lack, M.D.   Ct Abdomen Pelvis W Contrast  11/11/2012   *RADIOLOGY REPORT*  Clinical Data: Evaluate small bowel obstruction.  Nausea, vomiting.  CT ABDOMEN AND PELVIS WITH CONTRAST  Technique:  Multidetector CT imaging of the abdomen and pelvis was performed following the standard protocol during  bolus administration of intravenous contrast.  Contrast: OMNIPAQUE IOHEXOL 300 MG/ML  SOLN  Comparison: Plain films 11/11/2012.  CT 07/25/2012.  Findings: There is cardiomegaly.  Small pericardial effusion and right pleural effusion.  No confluent airspace opacities in the lung bases.  NG tube is in place with the tip in the proximal stomach. Decreasing distention of the stomach and proximal small bowel. There appears to be partial malrotation of the small bowel which never fully crosses the midline.  The distal duodenum and proximal jejunum are mildly prominent, but improved since prior study.  I see no obstructing process.  Gradual tapering of the small bowel to normal caliber small bowel distally.  Contrast has passed into the colon.  Mildly nodular contours of the liver suggest cirrhosis.  No focal abnormality.  Spleen, pancreas, adrenals are unremarkable.  Small low-density lesions in  the kidneys bilaterally compatible with small cysts.  There is a fatty lesion in the mid pole of the left kidney which measures 14 mm and is compatible with an angiomyolipoma.  Trace free fluid adjacent to the liver.  Urinary bladder is unremarkable.  Prior hysterectomy.  No adnexal masses.  No free air or adenopathy.  Aorta is normal caliber.  IMPRESSION:  Partial small bowel malrotation.  The duodenum never fully crosses the midline.  There is mild prominence of the distal duodenum and proximal jejunal small bowel loops which have improved since prior study.  Contrast has passed into distal small bowel and colon.  Small pericardial and right pleural effusion.   Original Report Authenticated By: Charlett Nose, M.D.   Ct Tibia Fibula Right Wo Contrast  12/08/2012   *RADIOLOGY REPORT*  Clinical Data: Injury to the tibia and fibula in May.  Now with swelling and redness.  On blood thinner.  CT OF THE RIGHT TIBIA FIBULA WITHOUT CONTRAST  Comparison: None.  Findings: No fracture.  Large prepatellar hematoma (transverse dimension  of at least 12 x 4.2 cm).  The superior aspect of this is not imaged.  Subcutaneous edema with reticulation of fat planes but without deep drainable abscess or hematoma.  IMPRESSION: Large prepatellar hematoma.  The superior aspect of this is not imaged.  Subcutaneous edema with reticulation of fat planes surrounding the calf but without deep drainable abscess or hematoma.   This has been made a PRA call report utilizing dashboard call feature.   Original Report Authenticated By: Lacy Duverney, M.D.   Dg Chest Port 1 View  11/13/2012   *RADIOLOGY REPORT*  Clinical Data: Shortness of breath, history hypertension, diabetes, CHF, atrial fibrillation, breast cancer, ovarian cancer, bladder cancer, cervical cancer, colon cancer  PORTABLE CHEST - 1 VIEW  Comparison: Portable exam 0937 hours compared to 11/11/2012  Findings: Right arm PICC line tip projects over SVC. Enlargement of cardiac silhouette with pulmonary vascular congestion. Atherosclerotic calcification of a tortuous thoracic aorta. Small bibasilar effusions and minimal bibasilar atelectasis increased since previous exam. Increased perihilar markings likely related to pulmonary venous hypertension and minimal perihilar edema. No segmental consolidation or pneumothorax. Bones diffusely demineralized.  IMPRESSION: Suspect minimal CHF with small bibasilar effusions atelectasis.   Original Report Authenticated By: Ulyses Southward, M.D.   Dg Chest Port 1 View  11/11/2012   *RADIOLOGY REPORT*  Clinical Data: PICC line placement.  PORTABLE CHEST - 1 VIEW  Comparison: 10/15/2012  Findings: Right upper extremity PICC line is in place with the catheter tip in the lower SVC at the cavoatrial junction.  A nasogastric tube extends below the diaphragm.  Heart is moderately enlarged.  There is atelectasis at both lung bases.  No overt edema.  IMPRESSION: PICC line tip lies at the cavoatrial junction.   Original Report Authenticated By: Irish Lack, M.D.   Dg Abd Acute  W/chest  11/11/2012   *RADIOLOGY REPORT*  Clinical Data: Nausea, vomiting.  ACUTE ABDOMEN SERIES (ABDOMEN 2 VIEW & CHEST 1 VIEW)  Comparison: 10/15/2012 chest radiograph and 07/25/2012 abdominopelvic CT  Findings: Right infrahilar consolidation.  Bihilar fullness. Cardiomegaly.  Aortic atherosclerosis.  Gaseous distension of stomach and a presumably proximal small bowel loops of bowel with air-fluid levels. There is air noted within the colon and distal bowel loops, which appear of normal caliber.  No overt evidence for free intraperitoneal air on the decubitus view. Pubic symphysis and lower lumbar DJD.  IMPRESSION: Gastric and proximal small bowel gaseous distension for which  an early or partial bowel obstruction is not excluded and can be further evaluated with CT.  Prominent cardiac contour may reflect cardiomegaly and/or pericardial effusion.  Right infrahilar opacity may reflect atelectasis, aspiration, or pneumonia.   Original Report Authenticated By: Jearld Lesch, M.D.    Jeoffrey Massed, MD  [facility] Pager:[phone]  If 7PM-7AM, please contact night-coverage [email or web address] Password [email or web address] 12/09/2012, 10:21 AM   LOS: 1 day

## 2012-12-09 NOTE — Evaluation (Signed)
Physical Therapy Evaluation Patient Details Name: [name] MRN: [id number] DOB: [birth year] Today's Date: 12/09/2012 Time: 4098-1191 PT Time Calculation (min): 18 min  PT Assessment / Plan / Recommendation Clinical Impression    Pt admitted with hematoma of rt leg after fall. Pt currently with functional limitations due to the deficits listed below (PT Problem List). Pt will benefit from skilled PT to increase their independence and safety with mobility to allow discharge to ST-SNF prior to home.      PT Assessment  Patient needs continued PT services    Follow Up Recommendations  SNF    Does the patient have the potential to tolerate intense rehabilitation      Barriers to Discharge Decreased caregiver support      Equipment Recommendations  None recommended by PT    Recommendations for Other Services     Frequency Min 2X/week    Precautions / Restrictions Precautions Precautions: Fall Restrictions Other Position/Activity Restrictions: WBAT rt leg   Pertinent Vitals/Pain Soreness rt leg.  Repositioned.      Mobility  Transfers Transfers: Sit to Stand;Stand to Sit Sit to Stand: 3: Mod assist;With upper extremity assist;With armrests;From chair/3-in-1 Stand to Sit: 4: Min assist;With upper extremity assist;With armrests;To chair/3-in-1 Details for Transfer Assistance: Pt using rocking momentum to get up. Ambulation/Gait Ambulation/Gait Assistance: 4: Min assist Ambulation Distance (Feet): 80 Feet Assistive device: Rolling walker Ambulation/Gait Assistance Details: Verbal cues to stand more erect.  Pt's walker from home higher than typical for her height. Unable to adjust her walker lower.  May be higher to promote more erect posture. Gait Pattern: Step-through pattern;Decreased step length - right;Decreased step length - left;Trunk flexed;Shuffle Gait velocity: decr    Exercises     PT Diagnosis: Difficulty walking;Generalized weakness  PT Problem List:  Decreased strength;Decreased balance;Decreased mobility;Decreased knowledge of use of DME;Decreased knowledge of precautions;Pain PT Treatment Interventions: DME instruction;Gait training;Patient/family education;Functional mobility training;Therapeutic activities;Balance training;Therapeutic exercise   PT Goals Acute Rehab PT Goals PT Goal Formulation: With patient Time For Goal Achievement: 12/16/12 Potential to Achieve Goals: Good Pt will go Supine/Side to Sit: with supervision PT Goal: Supine/Side to Sit - Progress: Goal set today Pt will go Sit to Supine/Side: with supervision PT Goal: Sit to Supine/Side - Progress: Goal set today Pt will go Sit to Stand: with supervision PT Goal: Sit to Stand - Progress: Goal set today Pt will go Stand to Sit: with supervision PT Goal: Stand to Sit - Progress: Goal set today Pt will Ambulate: 51 - 150 feet;with supervision PT Goal: Ambulate - Progress: Goal set today  Visit Information  Last PT Received On: 12/09/12 Assistance Needed: +1    Subjective Data  Subjective: "That's the best idea I have heard," pt stated about going for some more rehab. Patient Stated Goal: Go home after rehab.   Prior Functioning  Home Living Lives With: Daughter Available Help at Discharge: Available PRN/intermittently Home Adaptive Equipment: Walker - rolling Additional Comments: lift chair Prior Function Vocation: Retired Comments: amb with rolling walker but with falls. Communication Communication: No difficulties    Cognition  Cognition Arousal/Alertness: Awake/alert Behavior During Therapy: WFL for tasks assessed/performed Overall Cognitive Status: Within Functional Limits for tasks assessed    Extremity/Trunk Assessment Right Lower Extremity Assessment RLE ROM/Strength/Tone: Deficits RLE ROM/Strength/Tone Deficits: At least 3/5 but no resistance given due to pain. Left Lower Extremity Assessment LLE ROM/Strength/Tone: Deficits LLE  ROM/Strength/Tone Deficits: grossly 4/5   Balance Balance Balance Assessed: Yes Static Standing Balance Static Standing -  Balance Support: Bilateral upper extremity supported Static Standing - Level of Assistance: 4: Min assist  End of Session PT - End of Session Equipment Utilized During Treatment: Gait belt Activity Tolerance: Patient tolerated treatment well Patient left: in chair;with call bell/phone within reach;with family/visitor present Nurse Communication: Mobility status  GP     Hashir Deleeuw 12/09/2012, 2:31 PM  [facility] PT [phone]

## 2012-12-09 NOTE — Progress Notes (Addendum)
Clinical Social Work Department CLINICAL SOCIAL WORK PLACEMENT NOTE 12/09/2012  Patient:  [name], [name]  Account Number:  [id number] Admit date:  12/08/2012  -  Discharge date: 12/11/12  Clinical Social Worker:  Jacelyn Grip  Date/time:  12/09/2012 03:30 PM  Clinical Social Work is seeking post-discharge placement for this patient at the following level of care:   SKILLED NURSING   (*CSW will update this form in Epic as items are completed)   12/09/2012  Patient/family provided with [facility] Department of Clinical Social Work's list of facilities offering this level of care within the geographic area requested by the patient (or if unable, by the patient's family).  12/09/2012  Patient/family informed of their freedom to choose among providers that offer the needed level of care, that participate in Medicare, Medicaid or managed care program needed by the patient, have an available bed and are willing to accept the patient.  12/09/2012  Patient/family informed of [facility]' ownership interest in [facility], as well as of the fact that they are under no obligation to receive care at this facility.  PASARR submitted to EDS on 12/09/2012 PASARR number received from EDS on 12/09/2012-existing  FL2 transmitted to all facilities in geographic area requested by pt/family on  12/09/2012 FL2 transmitted to all facilities within larger geographic area on   Patient informed that his/her managed care company has contracts with or will negotiate with  certain facilities, including the following:     Patient/family informed of bed offers received:   Patient chooses bed at [facility] Physician recommends and patient chooses bed at    Patient to be transferred to [facility] on 12/11/12   Patient to be transferred to facility by ambulance  The following physician request were entered in Epic:   Additional Comments:   Jacklynn Lewis, MSW, [facility]  Clinical Social  Work Coverage [phone]

## 2012-12-09 NOTE — Progress Notes (Signed)
Clinical Social Work Department BRIEF PSYCHOSOCIAL ASSESSMENT 12/09/2012  Patient:  [name], [name]     Account Number:  [id number]     Admit date:  12/08/2012  Clinical Social Worker:  Jacelyn Grip  Date/Time:  12/09/2012 03:00 PM  Referred by:  Physician  Date Referred:  12/09/2012 Referred for  SNF Placement   Other Referral:   Interview type:  Patient Other interview type:    PSYCHOSOCIAL DATA Living Status:  FAMILY Admitted from facility:   Level of care:   Primary support name:  Gaylyn Rong Peterson/daughter/[phone] Primary support relationship to patient:  CHILD, ADULT Degree of support available:   strong    CURRENT CONCERNS Current Concerns  Post-Acute Placement   Other Concerns:    SOCIAL WORK ASSESSMENT / PLAN CSW received referral for New SNF.    CSW met with pt at bedside to discuss.    CSW discussed recommendation for ST SNF at dc. Pt reported that she was aware of recommendation and agreeable to SNF search in [facility]. CSW clarified pt questions re: insurance coverage for SNF. Pt agreeable to CSW contacting pt daughter via telephone.    CSW contacted pt daughter via telephone to discuss and clarify questions. Pt daughter agreeable to SNF search in [place].    CSW completed FL2 and initiated SNF search to [facility]    CSW to follow up with pt and pt daughter re: bed offers.    CSW to continue to follow and facilitate pt discharge needs when pt medically stable for discharge.   Assessment/plan status:  Psychosocial Support/Ongoing Assessment of Needs Other assessment/ plan:   discharge planning   Information/referral to community resources:   [facility] list    PATIENT'S/FAMILY'S RESPONSE TO PLAN OF CARE: Pt alert and oriented x 4. Pt very pleasant and actively engaged in assessment. Pt eager for ST SNF placement in order to return home with pt daughter.     Jacklynn Lewis, MSW, LCSWA  Clinical Social Work Coverage  [phone]

## 2012-12-09 NOTE — Consult Note (Signed)
Reason for Consult:hematoma right lower extremity Referring Physician: hospitalists  [name] is an 79 y.o. female.  HPI: the patient is a 79 year old female on long-term anticoagulation who suffered a fall several days ago.  She began having worsening swelling in her right lateral lower extremities right greater than left.  Because of significant swelling in her right lower surety she underwent CAT scan examination.  A large fluid collection was identified in the air the right knee and we are consulted for management.  She denies numbness or tingling in the lower extremity.  She has tenderness to palpation but once she stands her pain gets under reasonable control.  She has been able walk on the extremity.  Past Medical History  Diagnosis Date  . Hypertension   . SBO (small bowel obstruction)   . Permanent atrial fibrillation     a. coumadin d/c'd => Pradaxa in 05/2012  . Chronic diastolic heart failure     a. Echo 5/12: Mild LVH, EF 55-60%, mild AI, mild MR, severe LAE, mild RAE, PASP 31, small pericardial effusion  . Venous insufficiency     chronic LE edema  . HLD (hyperlipidemia)   . OSA (obstructive sleep apnea)   . Hypothyroidism   . Diastolic CHF, chronic 11/11/2012    Class 2b-3 2 d echo 5/12 mild LVH EF 55-50%, MILD AI, MILD MR, SEVERE LAE, mild RAE, PASP 31, SMALL PERICRADIAL EFFUSION  . Hx of cervical cancer 11/11/2012  . S/P hysterectomy 11/11/2012  . Colon cancer     "polyp" (12/08/2012)  . Breast cancer   . Cervical cancer   . Ovarian cancer   . Bladder cancer   . H/O ovarian cancer 11/11/2012     MUCINOUS CYST S/P RESECTION 35 YEARS AGO  . Hx of bladder cancer 11/11/2012  . DM2 (diabetes mellitus, type 2)     Past Surgical History  Procedure Laterality Date  . Bladder surgery    . Appendectomy    . Vaginal hysterectomy    . Exploratory laparotomy with abdominal mass excision      "21# ovarian tumor; benign" (12/08/2012)  . Cataract extraction w/ intraocular  lens  implant, bilateral    . Mastectomy, radical Left   . Breast lumpectomy Right   . Breast biopsy Bilateral     History reviewed. No pertinent family history.  Social History:  reports that she quit smoking about 46 years ago. Her smoking use included Cigarettes. She has a 75 pack-year smoking history. She has never used smokeless tobacco. She reports that she does not drink alcohol or use illicit drugs.  Allergies:  Allergies  Allergen Reactions  . Codeine     unknown  . Morphine And Related     sick  . Statins     sick    Medications: I have reviewed the patient's current medications.  Results for orders placed during the hospital encounter of 12/08/12 (from the past 48 hour(s))  GLUCOSE, CAPILLARY     Status: None   Collection Time    12/08/12  6:09 PM      Result Value Range   Glucose-Capillary 97  70 - 99 mg/dL   Comment 1 Notify RN    TSH     Status: Abnormal   Collection Time    12/08/12  6:29 PM      Result Value Range   TSH 9.538 (*) 0.350 - 4.500 uIU/mL  HEMOGLOBIN A1C     Status: Abnormal   Collection Time  12/08/12  6:29 PM      Result Value Range   Hemoglobin A1C 5.7 (*) <5.7 %   Comment: (NOTE)                                                                               According to the ADA Clinical Practice Recommendations for 2011, when     HbA1c is used as a screening test:      >=6.5%   Diagnostic of Diabetes Mellitus               (if abnormal result is confirmed)     5.7-6.4%   Increased risk of developing Diabetes Mellitus     References:Diagnosis and Classification of Diabetes Mellitus,Diabetes     Care,2011,34(Suppl 1):S62-S69 and Standards of Medical Care in             Diabetes - 2011,Diabetes Care,2011,34 (Suppl 1):S11-S61.   Mean Plasma Glucose 117 (*) <117 mg/dL  CBC     Status: Abnormal   Collection Time    12/08/12  6:29 PM      Result Value Range   WBC 6.8  4.0 - 10.5 K/uL   RBC 3.62 (*) 3.87 - 5.11 MIL/uL   Hemoglobin 11.5  (*) 12.0 - 15.0 g/dL   HCT 16.1 (*) 09.6 - 04.5 %   MCV 96.7  78.0 - 100.0 fL   MCH 31.8  26.0 - 34.0 pg   MCHC 32.9  30.0 - 36.0 g/dL   RDW 40.9 (*) 81.1 - 91.4 %   Platelets 243  150 - 400 K/uL  BASIC METABOLIC PANEL     Status: Abnormal   Collection Time    12/08/12  6:29 PM      Result Value Range   Sodium 139  135 - 145 mEq/L   Potassium 3.8  3.5 - 5.1 mEq/L   Chloride 100  96 - 112 mEq/L   CO2 30  19 - 32 mEq/L   Glucose, Bld 123 (*) 70 - 99 mg/dL   BUN 17  6 - 23 mg/dL   Creatinine, Ser 7.82  0.50 - 1.10 mg/dL   Calcium 9.2  8.4 - 95.6 mg/dL   GFR calc non Af Amer 50 (*) >90 mL/min   GFR calc Af Amer 58 (*) >90 mL/min   Comment:            The eGFR has been calculated     using the CKD EPI equation.     This calculation has not been     validated in all clinical     situations.     eGFR's persistently     <90 mL/min signify     possible Chronic Kidney Disease.  GLUCOSE, CAPILLARY     Status: Abnormal   Collection Time    12/08/12  9:29 PM      Result Value Range   Glucose-Capillary 104 (*) 70 - 99 mg/dL   Comment 1 Documented in Chart     Comment 2 Notify RN      Ct Tibia Fibula Right Wo Contrast  12/08/2012   *RADIOLOGY REPORT*  Clinical Data: Injury to the tibia and fibula in May.  Now with swelling and redness.  On blood thinner.  CT OF THE RIGHT TIBIA FIBULA WITHOUT CONTRAST  Comparison: None.  Findings: No fracture.  Large prepatellar hematoma (transverse dimension of at least 12 x 4.2 cm).  The superior aspect of this is not imaged.  Subcutaneous edema with reticulation of fat planes but without deep drainable abscess or hematoma.  IMPRESSION: Large prepatellar hematoma.  The superior aspect of this is not imaged.  Subcutaneous edema with reticulation of fat planes surrounding the calf but without deep drainable abscess or hematoma.   This has been made a PRA call report utilizing dashboard call feature.   Original Report Authenticated By: Lacy Duverney, M.D.     ROS: I have reviewed the patient's review of systems thoroughly and there are no positive responses as relates to the HPI. Exam:  Blood pressure 142/74, pulse 65, temperature 98.5 F (36.9 C), temperature source Oral, resp. rate 18, height 5\' 1"  (1.549 m), weight 96.9 kg (213 lb 10 oz), SpO2 100.00%. Well-developed well-nourished patient in no acute distress. Alert and oriented x3 HEENT:within normal limits Cardiac: Regular rate and rhythm Pulmonary: Lungs clear to auscultation Abdomen: Soft and nontender.  Normal active bowel sounds  Musculoskeletal: left lower extremity has mild to moderate soft tissue swelling.  There is no obvious fluid collections.  There is significant ecchymosis.  Right lower extremity: There is dramatic edema in the lower extremity.  There is extreme tenderness palpation globally over the lower extremity.  There is an obvious fluid collection in the subcutaneous tissue around the right knee.  There is mild pain to range of motion of the right knee.  There is area of what appears to be almost necrotic skin in multiple areas over the distal femur superior and lateral to the patella on the right side.  Assessment/Plan: 79 year old female with an obvious hematoma of the subcutaneous tissue on the right leg.//We had a long discussion about treatment options but ultimately felt that we could help her with a small I&D of this large fluid collection.  She is on anticoagulation and we felt that it was still a reasonable risk given that this was probably an old hematoma.  I reviewed the CT scan with the radiologist to identify the location and ultimately planned to do an I&D.I have had a prolonged discussion with the patient regarding the risk and benefits of the surgical procedure.  The patient understands the risks include but are not limited to bleeding, infection and failure of the surgery to cure the problem and need for further surgery.  The patient understands there is a  slight risk of death at the time of surgery.  The patient understands these risks along with the potential benefits and wishes to proceed with surgical intervention.  .  The patient will be followed in the office in the postoperative period.  Klynn Linnemann L 12/09/2012, 1:54 AM   Procedure note: After informed verbal consent was obtained, the patient had the right leg prepped and draped in the usual sterile fashion.  We numbed up a small area just inferior and lateral on the distal femur.  Once this was good and numb small incision was made in this area.  We then milked out probably 200 cc of current jelly type clot and old hematoma.  We placed a Penrose drain into the wound and did a compressive wrap from the foot all the way up to the mid thigh.  We put a significant amount of dressing material over the  open wound.  The monitored her post surgery for 15 min. To make sure that she was okay.we will continue to follow her in the hospital but ultimately she may be discharged per her internal medicine service and we could manage her drains an outpatient.  She she remains in the hospital we'll pull her drain on Wednesday.

## 2012-12-09 NOTE — Care Management Note (Signed)
    Page 1 of 1   12/11/2012     5:25:19 PM   CARE MANAGEMENT NOTE 12/11/2012  Patient:  JATIA, MUSA   Account Number:  [id number]  Date Initiated:  12/09/2012  Documentation initiated by:  Letha Cape  Subjective/Objective Assessment:   dx cellulitis of leg  admit- lives alone. Daughter is retired Advertising copywriter from [employer].  Cherylann Banas cell [phone].     Action/Plan:   pt eval- rec snf   Anticipated DC Date:  12/11/2012   Anticipated DC Plan:  SKILLED NURSING FACILITY  In-house referral  Clinical Social Worker      DC Planning Services  CM consult      Choice offered to / List presented to:             Status of service:  Completed, signed off Medicare Important Message given?   (If response is "NO", the following Medicare IM given date fields will be blank) Date Medicare IM given:   Date Additional Medicare IM given:    Discharge Disposition:  SKILLED NURSING FACILITY  Per UR Regulation:  Reviewed for med. necessity/level of care/duration of stay  If discussed at Long Length of Stay Meetings, dates discussed:    Comments:  12/11/12 17:24 Letha Cape RN, BSN [phone] patient dc to SNF, CSW following.  12/09/12 10:56 Letha Cape RN, BSN [phone] patient  lives alone, she has a daughter who is a retired IV Charity fundraiser here at [employer].  Await PT eval.  NCM will continue to follow for dc needs.  Physical therapy recs snf, patient and daughter, Gaylyn Rong are both in agreement with snf. Informed daughter that CSW will fax out clinical information in [facility] and then will get back with them about bed offers.  Gaylyn Rong states she is getting ready to leave now but will be back tomorrow at 1:30 to speak with CSW.

## 2012-12-09 NOTE — Progress Notes (Addendum)
Patient is refusing to wear CPAP. Unit and accessories have been set up. Patient states that the smell of the plastic tubing and mask will make her sick. She said she will bring her own the next time she comes to the hospital. Unit has been removed from the patient's room.

## 2012-12-09 NOTE — Progress Notes (Signed)
UR COMPLETED  

## 2012-12-10 DIAGNOSIS — I4891 Unspecified atrial fibrillation: Secondary | ICD-10-CM | POA: Diagnosis not present

## 2012-12-10 DIAGNOSIS — T148XXA Other injury of unspecified body region, initial encounter: Secondary | ICD-10-CM

## 2012-12-10 DIAGNOSIS — E039 Hypothyroidism, unspecified: Secondary | ICD-10-CM | POA: Diagnosis not present

## 2012-12-10 DIAGNOSIS — I872 Venous insufficiency (chronic) (peripheral): Secondary | ICD-10-CM

## 2012-12-10 LAB — BASIC METABOLIC PANEL
BUN: 16 mg/dL (ref 6–23)
Chloride: 102 mEq/L (ref 96–112)
GFR calc Af Amer: 65 mL/min — ABNORMAL LOW (ref >90)
GFR calc non Af Amer: 57 mL/min — ABNORMAL LOW (ref >90)
Glucose, Bld: 122 mg/dL — ABNORMAL HIGH (ref 70–99)
Potassium: 4 mEq/L (ref 3.5–5.1)
Sodium: 139 mEq/L (ref 135–145)

## 2012-12-10 LAB — CBC
HCT: 34 % — ABNORMAL LOW (ref 36.0–46.0)
Hemoglobin: 11.4 g/dL — ABNORMAL LOW (ref 12.0–15.0)
RDW: 16.5 % — ABNORMAL HIGH (ref 11.5–15.5)
WBC: 6 10*3/uL (ref 4.0–10.5)

## 2012-12-10 LAB — GLUCOSE, CAPILLARY
Glucose-Capillary: 117 mg/dL — ABNORMAL HIGH (ref 70–99)
Glucose-Capillary: 87 mg/dL (ref 70–99)

## 2012-12-10 NOTE — Progress Notes (Signed)
Subjective: 2 days s/p bedside I@D  r leg.  Pt feels dramatically better   Objective: Vital signs in last 24 hours: Temp:  [97.6 F (36.4 C)-98.5 F (36.9 C)] 97.6 F (36.4 C) (06/11 0536) Pulse Rate:  [72-101] 101 (06/11 0536) Resp:  [18-20] 20 (06/11 0536) BP: (88-117)/(43-74) 98/58 mmHg (06/11 0935) SpO2:  [90 %-97 %] 90 % (06/11 0536)  Intake/Output from previous day: 06/10 0701 - 06/11 0700 In: 480 [P.O.:480] Out: -  Intake/Output this shift:     Recent Labs  12/08/12 1829 12/09/12 0504  HGB 11.5* 10.5*    Recent Labs  12/08/12 1829 12/09/12 0504  WBC 6.8 5.5  RBC 3.62* 3.29*  HCT 35.0* 31.8*  PLT 243 234    Recent Labs  12/09/12 0504 12/10/12 0740  NA 138 139  K 3.5 4.0  CL 102 102  CO2 29 30  BUN 16 16  CREATININE 0.89 0.87  GLUCOSE 101* 122*  CALCIUM 8.8 8.3*   No results found for this basename: LABPT, INR,  in the last 72 hours  Neurologically intact ABD soft Neurovascular intact Sensation intact distally Intact pulses distally Dorsiflexion/Plantar flexion intact mild redness which i think is not cellulitis but rubor from hematoma  Assessment/Plan: S/p I@D  with dramatically improved swelling and pain. Will follow in hospital and then as op when d/ced WBAT at this point   Mansoor Hillyard L 12/10/2012, 10:18 AM

## 2012-12-10 NOTE — Progress Notes (Signed)
Patient refused CPAP.   Patient states that before she uses a new CPAP mask she soaks it in vinegar for five days.

## 2012-12-10 NOTE — Progress Notes (Signed)
PATIENT DETAILS Name: [name] Age: 79 y.o. Sex: female Date of Birth: Feb 10, 1920 Admit Date: 12/08/2012 Admitting Physician Zannie Cove, MD [id number] Juel Burrow, MD  Subjective: Marked improvement of the swelling and pain.  Assessment/Plan:  Hematoma of leg, right -2/2 mechanical fall that she sustained approximately 2 weeks back-she also is on Pradaxa for Afib -Pradaxa held on admission-spoke with Dr Luiz Blare, from his point of view anticoagulation can be resumed. -She wants to speak with her primary cardiologist regarding anticoagulation-have notified the [place] cardiology service.  Active Problems: Chronic diastolic heart failure:  -Currently compensated, continue Lasix and potassium replacement  Atrial fibrillation  - patient is on Cardizem, will continue -was on Pradaxa-see above -await PT eval and cards assessment as well.  Hypothyroidism  - Continue Synthroid  -TSH slightly elevated, but do not know when her synthroid dose was last adjusted-therefore will defer this to her PCP  Diabetes mellitus -CBG's stable on SSI  -A1C 5.7  Disposition: Remain inpatient  DVT Prophylaxis:  SCD's  Code Status: Full code   Family Communication None  Procedures:  I&D 6/9  CONSULTS:  orthopedic surgery   MEDICATIONS: Scheduled Meds: . bisacodyl  5 mg Oral BID  . diltiazem  360 mg Oral Daily  . ferrous sulfate  325 mg Oral BID WC  . furosemide  40 mg Oral BID  . insulin aspart  0-15 Units Subcutaneous TID WC  . insulin aspart  0-5 Units Subcutaneous QHS  . levothyroxine  200 mcg Oral QAC breakfast  . potassium chloride SA  20 mEq Oral Daily  . sodium chloride  3 mL Intravenous Q12H  . vancomycin  1,000 mg Intravenous Q24H   Continuous Infusions:  PRN Meds:.acetaminophen, acetaminophen, bisacodyl, HYDROcodone-acetaminophen, HYDROmorphone (DILAUDID) injection, ondansetron (ZOFRAN) IV, ondansetron, zolpidem  Antibiotics: Anti-infectives   Start      Dose/Rate Route Frequency Ordered Stop   12/08/12 2100  vancomycin (VANCOCIN) IVPB 1000 mg/200 mL premix     1,000 mg 200 mL/hr over 60 Minutes Intravenous Every 24 hours 12/08/12 2004         PHYSICAL EXAM: Vital signs in last 24 hours: Filed Vitals:   12/09/12 1516 12/09/12 2040 12/10/12 0536 12/10/12 0935  BP: 94/48 117/74 99/62 98/58   Pulse:  82 101   Temp:  98.5 F (36.9 C) 97.6 F (36.4 C)   TempSrc:  Oral Oral   Resp:  18 20   Height:      Weight:      SpO2:  97% 90%     Weight change:  Filed Weights   12/08/12 1700  Weight: 96.9 kg (213 lb 10 oz)   Body mass index is 40.39 kg/(m^2).   Gen Exam: Awake and alert with clear speech.   Neck: Supple, No JVD.   Chest: B/L Clear.   CVS: S1 S2 Regular, no murmurs.  Abdomen: soft, BS +, non tender, non distended.  Extremities:B/L lower extremities warm to touch.discoloration of her right lower ext-dressing in place Neurologic: Non Focal.   Skin: No Rash.   Wounds: N/A.    Intake/Output from previous day:  Intake/Output Summary (Last 24 hours) at 12/10/12 1120 Last data filed at 12/09/12 1300  Gross per 24 hour  Intake    240 ml  Output      0 ml  Net    240 ml     LAB RESULTS: CBC  Recent Labs Lab 12/08/12 1829 12/09/12 0504 12/10/12 0740  WBC 6.8 5.5 6.0  HGB 11.5* 10.5* 11.4*  HCT 35.0* 31.8* 34.0*  PLT 243 234 242  MCV 96.7 96.7 95.0  MCH 31.8 31.9 31.8  MCHC 32.9 33.0 33.5  RDW 16.8* 17.0* 16.5*    Chemistries   Recent Labs Lab 12/08/12 1829 12/09/12 0504 12/10/12 0740  NA 139 138 139  K 3.8 3.5 4.0  CL 100 102 102  CO2 30 29 30   GLUCOSE 123* 101* 122*  BUN 17 16 16   CREATININE 0.96 0.89 0.87  CALCIUM 9.2 8.8 8.3*    CBG:  Recent Labs Lab 12/09/12 0746 12/09/12 1201 12/09/12 1711 12/09/12 2126 12/10/12 0801  GLUCAP 102* 100* 122* 124* 116*    GFR Estimated Creatinine Clearance: 44.8 ml/min (by C-G formula based on Cr of 0.87).  Coagulation profile No results  found for this basename: INR, PROTIME,  in the last 168 hours  Cardiac Enzymes No results found for this basename: CK, CKMB, TROPONINI, MYOGLOBIN,  in the last 168 hours  No components found with this basename: POCBNP,  No results found for this basename: DDIMER,  in the last 72 hours  Recent Labs  12/08/12 1829  HGBA1C 5.7*   No results found for this basename: CHOL, HDL, LDLCALC, TRIG, CHOLHDL, LDLDIRECT,  in the last 72 hours  Recent Labs  12/08/12 1829  TSH 9.538*   No results found for this basename: VITAMINB12, FOLATE, FERRITIN, TIBC, IRON, RETICCTPCT,  in the last 72 hours No results found for this basename: LIPASE, AMYLASE,  in the last 72 hours  Urine Studies No results found for this basename: UACOL, UAPR, USPG, UPH, UTP, UGL, UKET, UBIL, UHGB, UNIT, UROB, ULEU, UEPI, UWBC, URBC, UBAC, CAST, CRYS, UCOM, BILUA,  in the last 72 hours  MICROBIOLOGY: No results found for this or any previous visit (from the past 240 hour(s)).  RADIOLOGY STUDIES/RESULTS: Abd 1 View (kub)  11/12/2012   *RADIOLOGY REPORT*  Clinical Data: Small bowel obstruction.  ABDOMEN - 1 VIEW  Comparison: 11/11/2012  Findings: Less prominent small bowel dilatation with ingested oral contrast for CT now present in the colon.  Findings are consistent with resolving partial small bowel obstruction/ileus.  IMPRESSION: Improved appearance with resolving small bowel obstruction/ileus.   Original Report Authenticated By: Irish Lack, M.D.   Ct Abdomen Pelvis W Contrast  11/11/2012   *RADIOLOGY REPORT*  Clinical Data: Evaluate small bowel obstruction.  Nausea, vomiting.  CT ABDOMEN AND PELVIS WITH CONTRAST  Technique:  Multidetector CT imaging of the abdomen and pelvis was performed following the standard protocol during bolus administration of intravenous contrast.  Contrast: OMNIPAQUE IOHEXOL 300 MG/ML  SOLN  Comparison: Plain films 11/11/2012.  CT 07/25/2012.  Findings: There is cardiomegaly.  Small  pericardial effusion and right pleural effusion.  No confluent airspace opacities in the lung bases.  NG tube is in place with the tip in the proximal stomach. Decreasing distention of the stomach and proximal small bowel. There appears to be partial malrotation of the small bowel which never fully crosses the midline.  The distal duodenum and proximal jejunum are mildly prominent, but improved since prior study.  I see no obstructing process.  Gradual tapering of the small bowel to normal caliber small bowel distally.  Contrast has passed into the colon.  Mildly nodular contours of the liver suggest cirrhosis.  No focal abnormality.  Spleen, pancreas, adrenals are unremarkable.  Small low-density lesions in the kidneys bilaterally compatible with small cysts.  There is a fatty lesion in the mid pole of the left kidney which measures  14 mm and is compatible with an angiomyolipoma.  Trace free fluid adjacent to the liver.  Urinary bladder is unremarkable.  Prior hysterectomy.  No adnexal masses.  No free air or adenopathy.  Aorta is normal caliber.  IMPRESSION:  Partial small bowel malrotation.  The duodenum never fully crosses the midline.  There is mild prominence of the distal duodenum and proximal jejunal small bowel loops which have improved since prior study.  Contrast has passed into distal small bowel and colon.  Small pericardial and right pleural effusion.   Original Report Authenticated By: Charlett Nose, M.D.   Ct Tibia Fibula Right Wo Contrast  12/08/2012   *RADIOLOGY REPORT*  Clinical Data: Injury to the tibia and fibula in May.  Now with swelling and redness.  On blood thinner.  CT OF THE RIGHT TIBIA FIBULA WITHOUT CONTRAST  Comparison: None.  Findings: No fracture.  Large prepatellar hematoma (transverse dimension of at least 12 x 4.2 cm).  The superior aspect of this is not imaged.  Subcutaneous edema with reticulation of fat planes but without deep drainable abscess or hematoma.  IMPRESSION: Large  prepatellar hematoma.  The superior aspect of this is not imaged.  Subcutaneous edema with reticulation of fat planes surrounding the calf but without deep drainable abscess or hematoma.   This has been made a PRA call report utilizing dashboard call feature.   Original Report Authenticated By: Lacy Duverney, M.D.   Dg Chest Port 1 View  11/13/2012   *RADIOLOGY REPORT*  Clinical Data: Shortness of breath, history hypertension, diabetes, CHF, atrial fibrillation, breast cancer, ovarian cancer, bladder cancer, cervical cancer, colon cancer  PORTABLE CHEST - 1 VIEW  Comparison: Portable exam 0937 hours compared to 11/11/2012  Findings: Right arm PICC line tip projects over SVC. Enlargement of cardiac silhouette with pulmonary vascular congestion. Atherosclerotic calcification of a tortuous thoracic aorta. Small bibasilar effusions and minimal bibasilar atelectasis increased since previous exam. Increased perihilar markings likely related to pulmonary venous hypertension and minimal perihilar edema. No segmental consolidation or pneumothorax. Bones diffusely demineralized.  IMPRESSION: Suspect minimal CHF with small bibasilar effusions atelectasis.   Original Report Authenticated By: Ulyses Southward, M.D.   Dg Chest Port 1 View  11/11/2012   *RADIOLOGY REPORT*  Clinical Data: PICC line placement.  PORTABLE CHEST - 1 VIEW  Comparison: 10/15/2012  Findings: Right upper extremity PICC line is in place with the catheter tip in the lower SVC at the cavoatrial junction.  A nasogastric tube extends below the diaphragm.  Heart is moderately enlarged.  There is atelectasis at both lung bases.  No overt edema.  IMPRESSION: PICC line tip lies at the cavoatrial junction.   Original Report Authenticated By: Irish Lack, M.D.   Dg Abd Acute W/chest  11/11/2012   *RADIOLOGY REPORT*  Clinical Data: Nausea, vomiting.  ACUTE ABDOMEN SERIES (ABDOMEN 2 VIEW & CHEST 1 VIEW)  Comparison: 10/15/2012 chest radiograph and 07/25/2012  abdominopelvic CT  Findings: Right infrahilar consolidation.  Bihilar fullness. Cardiomegaly.  Aortic atherosclerosis.  Gaseous distension of stomach and a presumably proximal small bowel loops of bowel with air-fluid levels. There is air noted within the colon and distal bowel loops, which appear of normal caliber.  No overt evidence for free intraperitoneal air on the decubitus view. Pubic symphysis and lower lumbar DJD.  IMPRESSION: Gastric and proximal small bowel gaseous distension for which an early or partial bowel obstruction is not excluded and can be further evaluated with CT.  Prominent cardiac contour may reflect cardiomegaly  and/or pericardial effusion.  Right infrahilar opacity may reflect atelectasis, aspiration, or pneumonia.   Original Report Authenticated By: Jearld Lesch, M.D.    Clint Lipps, MD  [facility] Pager:[phone]  If 7PM-7AM, please contact night-coverage [email or web address] Password [email or web address] 12/10/2012, 11:20 AM   LOS: 2 days

## 2012-12-11 DIAGNOSIS — I4891 Unspecified atrial fibrillation: Secondary | ICD-10-CM | POA: Diagnosis not present

## 2012-12-11 DIAGNOSIS — R262 Difficulty in walking, not elsewhere classified: Secondary | ICD-10-CM | POA: Diagnosis not present

## 2012-12-11 DIAGNOSIS — R609 Edema, unspecified: Secondary | ICD-10-CM | POA: Diagnosis not present

## 2012-12-11 DIAGNOSIS — I621 Nontraumatic extradural hemorrhage: Secondary | ICD-10-CM | POA: Diagnosis not present

## 2012-12-11 DIAGNOSIS — Z4889 Encounter for other specified surgical aftercare: Secondary | ICD-10-CM | POA: Diagnosis not present

## 2012-12-11 DIAGNOSIS — M7989 Other specified soft tissue disorders: Secondary | ICD-10-CM | POA: Diagnosis not present

## 2012-12-11 DIAGNOSIS — L02419 Cutaneous abscess of limb, unspecified: Secondary | ICD-10-CM | POA: Diagnosis not present

## 2012-12-11 DIAGNOSIS — R29818 Other symptoms and signs involving the nervous system: Secondary | ICD-10-CM | POA: Diagnosis not present

## 2012-12-11 DIAGNOSIS — Z79899 Other long term (current) drug therapy: Secondary | ICD-10-CM | POA: Diagnosis not present

## 2012-12-11 DIAGNOSIS — L0390 Cellulitis, unspecified: Secondary | ICD-10-CM | POA: Diagnosis not present

## 2012-12-11 DIAGNOSIS — I10 Essential (primary) hypertension: Secondary | ICD-10-CM | POA: Diagnosis not present

## 2012-12-11 DIAGNOSIS — Z01818 Encounter for other preprocedural examination: Secondary | ICD-10-CM | POA: Diagnosis not present

## 2012-12-11 DIAGNOSIS — S81809A Unspecified open wound, unspecified lower leg, initial encounter: Secondary | ICD-10-CM | POA: Diagnosis not present

## 2012-12-11 DIAGNOSIS — M25569 Pain in unspecified knee: Secondary | ICD-10-CM | POA: Diagnosis not present

## 2012-12-11 DIAGNOSIS — E119 Type 2 diabetes mellitus without complications: Secondary | ICD-10-CM | POA: Diagnosis not present

## 2012-12-11 DIAGNOSIS — E039 Hypothyroidism, unspecified: Secondary | ICD-10-CM | POA: Diagnosis not present

## 2012-12-11 DIAGNOSIS — L089 Local infection of the skin and subcutaneous tissue, unspecified: Secondary | ICD-10-CM | POA: Diagnosis not present

## 2012-12-11 DIAGNOSIS — K56609 Unspecified intestinal obstruction, unspecified as to partial versus complete obstruction: Secondary | ICD-10-CM | POA: Diagnosis not present

## 2012-12-11 DIAGNOSIS — I5032 Chronic diastolic (congestive) heart failure: Secondary | ICD-10-CM | POA: Diagnosis not present

## 2012-12-11 DIAGNOSIS — J189 Pneumonia, unspecified organism: Secondary | ICD-10-CM | POA: Diagnosis not present

## 2012-12-11 DIAGNOSIS — R279 Unspecified lack of coordination: Secondary | ICD-10-CM | POA: Diagnosis not present

## 2012-12-11 DIAGNOSIS — L918 Other hypertrophic disorders of the skin: Secondary | ICD-10-CM | POA: Diagnosis not present

## 2012-12-11 DIAGNOSIS — L98499 Non-pressure chronic ulcer of skin of other sites with unspecified severity: Secondary | ICD-10-CM | POA: Diagnosis not present

## 2012-12-11 DIAGNOSIS — S8010XA Contusion of unspecified lower leg, initial encounter: Secondary | ICD-10-CM | POA: Diagnosis not present

## 2012-12-11 DIAGNOSIS — L97809 Non-pressure chronic ulcer of other part of unspecified lower leg with unspecified severity: Secondary | ICD-10-CM | POA: Diagnosis not present

## 2012-12-11 DIAGNOSIS — S8990XA Unspecified injury of unspecified lower leg, initial encounter: Secondary | ICD-10-CM | POA: Diagnosis not present

## 2012-12-11 DIAGNOSIS — T148XXA Other injury of unspecified body region, initial encounter: Secondary | ICD-10-CM | POA: Diagnosis not present

## 2012-12-11 DIAGNOSIS — S81009A Unspecified open wound, unspecified knee, initial encounter: Secondary | ICD-10-CM | POA: Diagnosis not present

## 2012-12-11 DIAGNOSIS — L97909 Non-pressure chronic ulcer of unspecified part of unspecified lower leg with unspecified severity: Secondary | ICD-10-CM | POA: Diagnosis not present

## 2012-12-11 DIAGNOSIS — L980 Pyogenic granuloma: Secondary | ICD-10-CM | POA: Diagnosis not present

## 2012-12-11 DIAGNOSIS — Z5189 Encounter for other specified aftercare: Secondary | ICD-10-CM | POA: Diagnosis not present

## 2012-12-11 LAB — GLUCOSE, CAPILLARY: Glucose-Capillary: 119 mg/dL — ABNORMAL HIGH (ref 70–99)

## 2012-12-11 LAB — BASIC METABOLIC PANEL
BUN: 18 mg/dL (ref 6–23)
Calcium: 8.3 mg/dL — ABNORMAL LOW (ref 8.4–10.5)
Creatinine, Ser: 0.77 mg/dL (ref 0.50–1.10)
GFR calc non Af Amer: 71 mL/min — ABNORMAL LOW (ref >90)
Glucose, Bld: 112 mg/dL — ABNORMAL HIGH (ref 70–99)
Sodium: 138 mEq/L (ref 135–145)

## 2012-12-11 LAB — CBC
MCH: 31.8 pg (ref 26.0–34.0)
MCHC: 33.4 g/dL (ref 30.0–36.0)
MCV: 95.2 fL (ref 78.0–100.0)
Platelets: 239 10*3/uL (ref 150–400)
RDW: 16.7 % — ABNORMAL HIGH (ref 11.5–15.5)

## 2012-12-11 MED ORDER — METOPROLOL TARTRATE 12.5 MG HALF TABLET
12.5000 mg | ORAL_TABLET | Freq: Two times a day (BID) | ORAL | Status: DC
  Administered 2012-12-11: 12.5 mg via ORAL
  Filled 2012-12-11 (×2): qty 1

## 2012-12-11 MED ORDER — HYDROCODONE-ACETAMINOPHEN 5-325 MG PO TABS: 1.0000 | ORAL_TABLET | ORAL | Status: DC | PRN

## 2012-12-11 MED ORDER — ASPIRIN 81 MG PO TBEC: 81.0000 mg | DELAYED_RELEASE_TABLET | Freq: Every day | ORAL | Status: DC

## 2012-12-11 MED ORDER — LEVOTHYROXINE SODIUM 112 MCG PO TABS: 224.0000 ug | ORAL_TABLET | ORAL | Status: DC

## 2012-12-11 NOTE — Progress Notes (Signed)
Pt had period of SVT while moving from bed to chair. Pt was asymptomatic. On call NP notified & HR now 117-130's. Will continue to monitor.

## 2012-12-11 NOTE — Progress Notes (Signed)
Physical Therapy Treatment Patient Details Name: [name] MRN: [id number] DOB: [birth year] Today's Date: 12/11/2012 Time: 1010-1055 PT Time Calculation (min): 45 min  PT Assessment / Plan / Recommendation Comments on Treatment Session   Pt is improving in mobility and ambulation and is ready to d/c to SNF for continued rehab. Pt needs continued work with edema management on right foot    Follow Up Recommendations  SNF     Does the patient have the potential to tolerate intense rehabilitation     Barriers to Discharge        Equipment Recommendations  None recommended by PT    Recommendations for Other Services    Frequency Min 2X/week   Plan      Precautions / Restrictions Precautions Precautions: Fall Restrictions Weight Bearing Restrictions: No Other Position/Activity Restrictions: WBAT rt leg   Pertinent Vitals/Pain Pt with pain in right heel that improved with ambulation    Mobility  Transfers Transfers: Sit to Stand;Stand to Sit Sit to Stand: With upper extremity assist;With armrests;From chair/3-in-1;4: Min assist Stand to Sit: 4: Min assist;With upper extremity assist;With armrests;To chair/3-in-1 Details for Transfer Assistance: Pt using rocking momentum to get up. does better from a higher surface  pt able to use commode for urination Ambulation/Gait Ambulation/Gait Assistance: 4: Min assist Assistive device: Rolling walker Ambulation/Gait Assistance Details: Cues to slow down and walk more erect.  Pt has increased HR to 160's during gait Gait Pattern: Step-through pattern;Decreased step length - right;Decreased step length - left;Trunk flexed;Shuffle Gait velocity: decr General Gait Details: Pt reports pain at first, but felt better as she increased walking distance Stairs: No Wheelchair Mobility Wheelchair Mobility: No    Exercises General Exercises - Lower Extremity Ankle Circles/Pumps: AROM;Both;5 reps Long Arc Quad: AROM;Both;5 reps Other  Exercises Other Exercises: pt with increased swelling on top of right foot.  Used edema measures on top of foot and toes: gentle massage, AAROM, elevation, rewrapped with ace wrap   PT Diagnosis:    PT Problem List:   PT Treatment Interventions:     PT Goals Acute Rehab PT Goals PT Goal Formulation: With patient Time For Goal Achievement: 12/16/12 Potential to Achieve Goals: Good Pt will go Supine/Side to Sit: with supervision Pt will go Sit to Supine/Side: with supervision Pt will go Sit to Stand: with supervision PT Goal: Sit to Stand - Progress: Progressing toward goal Pt will go Stand to Sit: with supervision PT Goal: Stand to Sit - Progress: Progressing toward goal Pt will Ambulate: 51 - 150 feet;with supervision PT Goal: Ambulate - Progress: Progressing toward goal  Visit Information  Last PT Received On: 12/11/12 Assistance Needed: +1    Subjective Data  Subjective: pt reported she has some pain in right heel that improved with walking Patient Stated Goal: Go home after rehab.   Cognition  Cognition Arousal/Alertness: Awake/alert Behavior During Therapy: WFL for tasks assessed/performed Overall Cognitive Status: Within Functional Limits for tasks assessed    Balance  Balance Balance Assessed: Yes Static Standing Balance Static Standing - Balance Support: Bilateral upper extremity supported Static Standing - Level of Assistance: 4: Min assist;5: Stand by assistance High Level Balance High Level Balance Comments: pt maintains flexed posture  End of Session PT - End of Session Equipment Utilized During Treatment: Gait belt Activity Tolerance: Patient tolerated treatment well Patient left: in chair;with call bell/phone within reach;with family/visitor present Nurse Communication: Mobility status   GP     Donnetta Hail 12/11/2012, 11:25 AM

## 2012-12-11 NOTE — Discharge Summary (Signed)
Physician Discharge Summary  [name] HYQ:[id number] DOB: [birth year] DOA: 12/08/2012  PCP: [name], MD  Admit date: 12/08/2012 Discharge date: 12/11/2012  Time spent: 40 minutes  Recommendations for Outpatient Follow-up:  1. Followup with primary care physician in one week. 2. Patient will be off of Pradaxa, followup with cardiology in one week to decide about resuming anticoagulation.  Discharge Diagnoses:  Principal Problem:   Hematoma Active Problems:   Chronic diastolic heart failure   Atrial fibrillation   Diabetes mellitus   Hypothyroidism   Discharge Condition: Stable  Diet recommendation: Heart healthy diet  Filed Weights   12/08/12 1700  Weight: 96.9 kg (213 lb 10 oz)    History of present illness:  Patient is a 79 year old female with history of atrial fibrillation on pradaxa, chronic diastolic CHF, diabetes, hyperlipidemia, obstructive sleep apnea, recurrent SBO present it is cracked admit from her PCPs office for worsening of the right lower extremity pain and redness in the last 1 week. History was obtained from the patient in her daughter present in the room. Per patient, on May 31st, patient was at [facility] theater when she tripped on a rug. She fell on her right knee and has a nickel size abrasion. Patient went to the ER but was found to have no fractures or dislocations and was DC'd home. Patient noticed that her knee was getting more darker and purplish hands she saw another physician in her PCPs office on 12/03/12 and was placed on Levaquin. Patient had now developed a dark eschar on her knee and her leg was getting tight, had pain on bearing weight today. She saw her PCP Dr. Clarene Duke today and was sent to the hospital as a direct admit for failed outpatient treatment of cellulitis. Patient has continued to take her Pradaxa until this morning.  Hospital Course:   1. Right lower extremity hematoma: This is secondary to mechanical fall and sustained  approximately 2 weeks prior to admission to the hospital. Patient is on PRADAXA 4 atrial fibrillation. Patient then developed a lot of pain and swelling around the left knee and upon admission to the hospital Dr. Luiz Blare of orthopedics service was consulted. Pradaxa held upon admission, orthopedics evacuated the hematoma, patient felt much better afterwards. PT/OT recommended skilled nursing facility.  2. Atrial fibrillation: Patient is on Cardizem, she is on 360 mg. Her weight was not very controlled especially with exercise which is getting up in the hospital so metoprolol 12.5 mg was added. Because of blood pressure was soft the ramipril was discontinued. Patient is on PRADAXA, secondary to the recent hematoma and knee surgeryPradaxa was discontinued while she is in the hospital patient was placed on low dose of aspirin for thromboembolic prevention. Patient to see her primary care physician/cardiologist within one week to decide about placing her back on anticoagulation versus continuation on aspirin only.  3. Chronic diastolic CHF: Patient currently compensated, she is on Lasix and potassium supplementation.  4. Hypothyroidism: TSH is elevated at 9.538, patient's Synthroid dose increased from 200 to 224, TSH needs to be checked in 4-6 weeks and adjust dose accordingly.  Procedures:  Evacuation of hematoma from around the left knee done by Dr. Luiz Blare  Consultations:  Jodi Geralds of orthopedic service  Discharge Exam: Filed Vitals:   12/10/12 1400 12/10/12 2138 12/11/12 0526 12/11/12 0749  BP: 110/68 127/79 106/61 114/68  Pulse: 82 91 73 120  Temp: 97.5 F (36.4 C) 98.3 F (36.8 C) 99.1 F (37.3 C)   TempSrc:  Oral Oral Oral   Resp: 20 20 18    Height:      Weight:      SpO2: 96% 94% 97%    General: Alert and awake, oriented x3, not in any acute distress. HEENT: anicteric sclera, pupils reactive to light and accommodation, EOMI CVS: S1-S2 clear, no murmur rubs or gallops Chest:  clear to auscultation bilaterally, no wheezing, rales or rhonchi Abdomen: soft nontender, nondistended, normal bowel sounds, no organomegaly Extremities: no cyanosis, clubbing or edema noted bilaterally Neuro: Cranial nerves II-XII intact, no focal neurological deficits  Discharge Instructions      Discharge Orders   Future Appointments Provider Department Dept Phone   12/18/2012 1:30 PM Minda Meo, PA-C  [facility] [place]) [phone]   Future Orders Complete By Expires     Diet - low sodium heart healthy  As directed     Increase activity slowly  As directed         Medication List    STOP taking these medications       ramipril 10 MG capsule  Commonly known as:  ALTACE      TAKE these medications       aspirin 81 MG EC tablet  Take 1 tablet (81 mg total) by mouth daily. Swallow whole.     bisacodyl 10 MG suppository  Commonly known as:  DULCOLAX  Place 1 suppository (10 mg total) rectally as needed for constipation.     CARDIZEM CD 360 MG 24 hr capsule  Generic drug:  diltiazem  Take 1 capsule (360 mg total) by mouth daily.     ferrous sulfate 325 (65 FE) MG tablet  Take 325 mg by mouth 2 (two) times daily.     furosemide 40 MG tablet  Commonly known as:  LASIX  Take 80 mg by mouth 2 (two) times daily.     HYDROcodone-acetaminophen 5-325 MG per tablet  Commonly known as:  NORCO/VICODIN  Take 1-2 tablets by mouth every 4 (four) hours as needed for pain.     levothyroxine 112 MCG tablet  Commonly known as:  SYNTHROID, LEVOTHROID  Take 2 tablets (224 mcg total) by mouth every morning.     potassium chloride SA 20 MEQ tablet  Commonly known as:  K-DUR,KLOR-CON  Take 1 tablet (20 mEq total) by mouth daily.       Allergies  Allergen Reactions  . Codeine     unknown  . Morphine And Related     sick  . Statins     sick   Follow-up Information   Follow up with Harvie Junior, MD. Call on 12/16/2012. (call sooner  as needed if  symptoms worsen)    Contact information:   [address] [place] [address] [phone]       Follow up with [name], MD In 1 week.   Contact information:   [address] [place] [address] [phone]       Follow up with Hillis Range, MD In 1 week.   Contact information:   [address] [place] [address] [phone]        The results of significant diagnostics from this hospitalization (including imaging, microbiology, ancillary and laboratory) are listed below for reference.    Significant Diagnostic Studies: Abd 1 View (kub)  11/12/2012   *RADIOLOGY REPORT*  Clinical Data: Small bowel obstruction.  ABDOMEN - 1 VIEW  Comparison: 11/11/2012  Findings: Less prominent small bowel dilatation with ingested  oral contrast for CT now present in the colon.  Findings are consistent with resolving partial small bowel obstruction/ileus.  IMPRESSION: Improved appearance with resolving small bowel obstruction/ileus.   Original Report Authenticated By: Irish Lack, M.D.   Ct Abdomen Pelvis W Contrast  11/11/2012   *RADIOLOGY REPORT*  Clinical Data: Evaluate small bowel obstruction.  Nausea, vomiting.  CT ABDOMEN AND PELVIS WITH CONTRAST  Technique:  Multidetector CT imaging of the abdomen and pelvis was performed following the standard protocol during bolus administration of intravenous contrast.  Contrast: OMNIPAQUE IOHEXOL 300 MG/ML  SOLN  Comparison: Plain films 11/11/2012.  CT 07/25/2012.  Findings: There is cardiomegaly.  Small pericardial effusion and right pleural effusion.  No confluent airspace opacities in the lung bases.  NG tube is in place with the tip in the proximal stomach. Decreasing distention of the stomach and proximal small bowel. There appears to be partial malrotation of the small bowel which never fully crosses the midline.  The distal duodenum and proximal jejunum are mildly prominent, but improved since prior study.  I see  no obstructing process.  Gradual tapering of the small bowel to normal caliber small bowel distally.  Contrast has passed into the colon.  Mildly nodular contours of the liver suggest cirrhosis.  No focal abnormality.  Spleen, pancreas, adrenals are unremarkable.  Small low-density lesions in the kidneys bilaterally compatible with small cysts.  There is a fatty lesion in the mid pole of the left kidney which measures 14 mm and is compatible with an angiomyolipoma.  Trace free fluid adjacent to the liver.  Urinary bladder is unremarkable.  Prior hysterectomy.  No adnexal masses.  No free air or adenopathy.  Aorta is normal caliber.  IMPRESSION:  Partial small bowel malrotation.  The duodenum never fully crosses the midline.  There is mild prominence of the distal duodenum and proximal jejunal small bowel loops which have improved since prior study.  Contrast has passed into distal small bowel and colon.  Small pericardial and right pleural effusion.   Original Report Authenticated By: Charlett Nose, M.D.   Ct Tibia Fibula Right Wo Contrast  12/08/2012   *RADIOLOGY REPORT*  Clinical Data: Injury to the tibia and fibula in May.  Now with swelling and redness.  On blood thinner.  CT OF THE RIGHT TIBIA FIBULA WITHOUT CONTRAST  Comparison: None.  Findings: No fracture.  Large prepatellar hematoma (transverse dimension of at least 12 x 4.2 cm).  The superior aspect of this is not imaged.  Subcutaneous edema with reticulation of fat planes but without deep drainable abscess or hematoma.  IMPRESSION: Large prepatellar hematoma.  The superior aspect of this is not imaged.  Subcutaneous edema with reticulation of fat planes surrounding the calf but without deep drainable abscess or hematoma.   This has been made a PRA call report utilizing dashboard call feature.   Original Report Authenticated By: Lacy Duverney, M.D.   Dg Chest Port 1 View  11/13/2012   *RADIOLOGY REPORT*  Clinical Data: Shortness of breath, history  hypertension, diabetes, CHF, atrial fibrillation, breast cancer, ovarian cancer, bladder cancer, cervical cancer, colon cancer  PORTABLE CHEST - 1 VIEW  Comparison: Portable exam 0937 hours compared to 11/11/2012  Findings: Right arm PICC line tip projects over SVC. Enlargement of cardiac silhouette with pulmonary vascular congestion. Atherosclerotic calcification of a tortuous thoracic aorta. Small bibasilar effusions and minimal bibasilar atelectasis increased since previous exam. Increased perihilar markings likely related to pulmonary venous hypertension and minimal perihilar edema.  No segmental consolidation or pneumothorax. Bones diffusely demineralized.  IMPRESSION: Suspect minimal CHF with small bibasilar effusions atelectasis.   Original Report Authenticated By: Ulyses Southward, M.D.   Dg Chest Port 1 View  11/11/2012   *RADIOLOGY REPORT*  Clinical Data: PICC line placement.  PORTABLE CHEST - 1 VIEW  Comparison: 10/15/2012  Findings: Right upper extremity PICC line is in place with the catheter tip in the lower SVC at the cavoatrial junction.  A nasogastric tube extends below the diaphragm.  Heart is moderately enlarged.  There is atelectasis at both lung bases.  No overt edema.  IMPRESSION: PICC line tip lies at the cavoatrial junction.   Original Report Authenticated By: Irish Lack, M.D.    Microbiology: No results found for this or any previous visit (from the past 240 hour(s)).   Labs: Basic Metabolic Panel:  Recent Labs Lab 12/08/12 1829 12/09/12 0504 12/10/12 0740 12/11/12 0450  NA 139 138 139 138  K 3.8 3.5 4.0 3.7  CL 100 102 102 103  CO2 30 29 30 26   GLUCOSE 123* 101* 122* 112*  BUN 17 16 16 18   CREATININE 0.96 0.89 0.87 0.77  CALCIUM 9.2 8.8 8.3* 8.3*   Liver Function Tests: No results found for this basename: AST, ALT, ALKPHOS, BILITOT, PROT, ALBUMIN,  in the last 168 hours No results found for this basename: LIPASE, AMYLASE,  in the last 168 hours No results found  for this basename: AMMONIA,  in the last 168 hours CBC:  Recent Labs Lab 12/08/12 1829 12/09/12 0504 12/10/12 0740 12/11/12 0450  WBC 6.8 5.5 6.0 5.4  HGB 11.5* 10.5* 11.4* 10.7*  HCT 35.0* 31.8* 34.0* 32.0*  MCV 96.7 96.7 95.0 95.2  PLT 243 234 242 239   Cardiac Enzymes: No results found for this basename: CKTOTAL, CKMB, CKMBINDEX, TROPONINI,  in the last 168 hours BNP: BNP (last 3 results) No results found for this basename: PROBNP,  in the last 8760 hours CBG:  Recent Labs Lab 12/10/12 0801 12/10/12 1232 12/10/12 1730 12/10/12 2126 12/11/12 0737  GLUCAP 116* 117* 87 154* 110*       Signed:  Zoran Yankee A  [facility] 12/11/2012, 11:28 AM

## 2012-12-11 NOTE — Progress Notes (Signed)
NURSING PROGRESS NOTE  [name] [id number] Discharge Data: 12/11/2012 4:31 PM Attending Provider: Clydia Llano, MD WUJ:[id number],NWGNF Juel Burrow, MD     Mikeal Hawthorne to be D/C'd Skilled nursing facility per MD order. All IV's discontinued with no bleeding noted. All belongings returned to patient for patient to take home.   Last Vital Signs:  Blood pressure 97/67, pulse 73, temperature 98 F (36.7 C), temperature source Oral, resp. rate 18, height 5\' 1"  (1.549 m), weight 96.9 kg (213 lb 10 oz), SpO2 98.00%.  Discharge Medication List   Medication List    STOP taking these medications       ramipril 10 MG capsule  Commonly known as:  ALTACE      TAKE these medications       aspirin 81 MG EC tablet  Take 1 tablet (81 mg total) by mouth daily. Swallow whole.     bisacodyl 10 MG suppository  Commonly known as:  DULCOLAX  Place 1 suppository (10 mg total) rectally as needed for constipation.     CARDIZEM CD 360 MG 24 hr capsule  Generic drug:  diltiazem  Take 1 capsule (360 mg total) by mouth daily.     ferrous sulfate 325 (65 FE) MG tablet  Take 325 mg by mouth 2 (two) times daily.     furosemide 40 MG tablet  Commonly known as:  LASIX  Take 80 mg by mouth 2 (two) times daily.     HYDROcodone-acetaminophen 5-325 MG per tablet  Commonly known as:  NORCO/VICODIN  Take 1-2 tablets by mouth every 4 (four) hours as needed for pain.     levothyroxine 112 MCG tablet  Commonly known as:  SYNTHROID, LEVOTHROID  Take 2 tablets (224 mcg total) by mouth every morning.     potassium chloride SA 20 MEQ tablet  Commonly known as:  K-DUR,KLOR-CON  Take 1 tablet (20 mEq total) by mouth daily.

## 2012-12-11 NOTE — Progress Notes (Signed)
Subjective:     Patient reports pain as 2 on 0-10 scale. Objective: Vital signs in last 24 hours: Temp:  [97.5 F (36.4 C)-99.1 F (37.3 C)] 99.1 F (37.3 C) (06/12 0526) Pulse Rate:  [73-120] 120 (06/12 0749) Resp:  [18-20] 18 (06/12 0526) BP: (98-127)/(58-79) 114/68 mmHg (06/12 0749) SpO2:  [94 %-97 %] 97 % (06/12 0526)  Intake/Output from previous day: 06/11 0701 - 06/12 0700 In: 540 [P.O.:540] Out: -  Intake/Output this shift:     Recent Labs  12/08/12 1829 12/09/12 0504 12/10/12 0740 12/11/12 0450  HGB 11.5* 10.5* 11.4* 10.7*    Recent Labs  12/10/12 0740 12/11/12 0450  WBC 6.0 5.4  RBC 3.58* 3.36*  HCT 34.0* 32.0*  PLT 242 239    Recent Labs  12/10/12 0740 12/11/12 0450  NA 139 138  K 4.0 3.7  CL 102 103  CO2 30 26  BUN 16 18  CREATININE 0.87 0.77  GLUCOSE 122* 112*  CALCIUM 8.3* 8.3*   Right leg: Compartments much softer. Still has significant ecchymosis.N-V intact distally   Assessment/Plan     2 days S/P I&D and evacuation of large hematoma right lower leg Plan: Dressing changed OK to D/C to SNF WBAT on Right Keep leg wrapped F/U Dr Luiz Blare next Tues in office.   Lashae Wollenberg G 12/11/2012, 8:54 AM

## 2012-12-12 DIAGNOSIS — Z4889 Encounter for other specified surgical aftercare: Secondary | ICD-10-CM | POA: Diagnosis not present

## 2012-12-12 DIAGNOSIS — S8010XA Contusion of unspecified lower leg, initial encounter: Secondary | ICD-10-CM | POA: Diagnosis not present

## 2012-12-12 DIAGNOSIS — E119 Type 2 diabetes mellitus without complications: Secondary | ICD-10-CM | POA: Diagnosis not present

## 2012-12-12 DIAGNOSIS — R262 Difficulty in walking, not elsewhere classified: Secondary | ICD-10-CM | POA: Diagnosis not present

## 2012-12-18 ENCOUNTER — Ambulatory Visit (INDEPENDENT_AMBULATORY_CARE_PROVIDER_SITE_OTHER): Payer: Medicare Other | Admitting: Cardiology

## 2012-12-18 ENCOUNTER — Encounter: Payer: Self-pay | Admitting: Cardiology

## 2012-12-18 VITALS — BP 100/70 | HR 82 | Ht 61.0 in | Wt 211.8 lb

## 2012-12-18 DIAGNOSIS — S8011XA Contusion of right lower leg, initial encounter: Secondary | ICD-10-CM

## 2012-12-18 DIAGNOSIS — S8010XA Contusion of unspecified lower leg, initial encounter: Secondary | ICD-10-CM

## 2012-12-18 DIAGNOSIS — I4891 Unspecified atrial fibrillation: Secondary | ICD-10-CM | POA: Diagnosis not present

## 2012-12-18 NOTE — Patient Instructions (Addendum)
Your physician recommends that you schedule a follow-up appointment in: 01-13-2013 @ 11:30 WITH BR[facility]KE   Your physician recommends that you continue on your current medications as directed. Please refer to the Current Medication list given to you today.

## 2012-12-18 NOTE — Progress Notes (Signed)
ELECTROPHYSIOLOGY OFFICE NOTE  Patient ID: [name] MRN: [id number], DOB/AGE: [birth year]   Date of Visit: 12/18/2012  Primary Physician: [name], MD Primary Cardiologist: Hillis Range, MD Reason for Visit: To discuss anticoagulation  History of Present Illness  [name] is a pleasant 79 year old woman with permanent atrial fibrillation, chronic diastolic HF, DM, HTN, dyslipidemia, OSA and hypothyroidism. She also has a history of breast, bladder, cervical and ovarian cancer as well as recurrent small bowel obstructions secondary to adhesions. On 12/08/2012 she was admitted to [facility] after mechanical fall with subsequent large right knee hematoma. Her Pradaxa was discontinued. She is here today to discuss possibly resuming anticoagulation, scheduled by Dr. Johney Frame.  Since last being seen in our clinic, she reports she is doing well from a cardiac standpoint. Today, she denies chest pain or shortness of breath. She denies palpitations, dizziness, near syncope or syncope. She denies LE swelling, orthopnea, PND or recent weight gain. She continues to have I&D procedures done on the knee with actively healing incisions and large wound with active bleeding.  Past Medical History Past Medical History  Diagnosis Date  . Hypertension   . SBO (small bowel obstruction)   . Permanent atrial fibrillation     a. coumadin d/c'd => Pradaxa in 05/2012  . Chronic diastolic heart failure     a. Echo 5/12: Mild LVH, EF 55-60%, mild AI, mild MR, severe LAE, mild RAE, PASP 31, small pericardial effusion  . Venous insufficiency     chronic LE edema  . HLD (hyperlipidemia)   . OSA (obstructive sleep apnea)   . Hypothyroidism   . Diastolic CHF, chronic 11/11/2012    Class 2b-3 2 d echo 5/12 mild LVH EF 55-50%, MILD AI, MILD MR, SEVERE LAE, mild RAE, PASP 31, SMALL PERICRADIAL EFFUSION  . Hx of cervical cancer 11/11/2012  . S/P hysterectomy 11/11/2012  . Colon cancer     "polyp" (12/08/2012)   . Breast cancer   . Cervical cancer   . Ovarian cancer   . Bladder cancer   . H/O ovarian cancer 11/11/2012     MUCINOUS CYST S/P RESECTION 35 YEARS AGO  . Hx of bladder cancer 11/11/2012  . DM2 (diabetes mellitus, type 2)     Past Surgical History Past Surgical History  Procedure Laterality Date  . Bladder surgery    . Appendectomy    . Vaginal hysterectomy    . Exploratory laparotomy with abdominal mass excision      "21# ovarian tumor; benign" (12/08/2012)  . Cataract extraction w/ intraocular lens  implant, bilateral    . Mastectomy, radical Left   . Breast lumpectomy Right   . Breast biopsy Bilateral     Allergies/Intolerances Allergies  Allergen Reactions  . Codeine     unknown  . Morphine And Related     sick  . Statins     sick   Current Home Medications Current Outpatient Prescriptions  Medication Sig Dispense Refill  . aspirin 81 MG EC tablet Take 1 tablet (81 mg total) by mouth daily. Swallow whole.      . bisacodyl (DULCOLAX) 10 MG suppository Place 10 mg rectally 2 (two) times daily as needed for constipation.      Marland Kitchen diltiazem (CARDIZEM CD) 360 MG 24 hr capsule Take 1 capsule (360 mg total) by mouth daily.      . ferrous sulfate 325 (65 FE) MG tablet Take 325 mg by mouth daily with breakfast.       .  furosemide (LASIX) 40 MG tablet Take 80 mg by mouth 2 (two) times daily.      Marland Kitchen HYDROcodone-acetaminophen (NORCO/VICODIN) 5-325 MG per tablet Take 1-2 tablets by mouth every 4 (four) hours as needed for pain.  20 tablet  0  . levothyroxine (SYNTHROID, LEVOTHROID) 112 MCG tablet Take 2 tablets (224 mcg total) by mouth every morning.      . potassium chloride SA (K-DUR,KLOR-CON) 20 MEQ tablet Take 1 tablet (20 mEq total) by mouth daily.  90 tablet  3   No current facility-administered medications for this visit.   Social History History   Social History  . Marital Status: Widowed    Spouse Name: N/A    Number of Children: N/A  . Years of Education: N/A    Occupational History  . Not on file.   Social History Main Topics  . Smoking status: Former Smoker -- 3.00 packs/day for 25 years    Types: Cigarettes    Quit date: 07/02/1966  . Smokeless tobacco: Never Used  . Alcohol Use: No  . Drug Use: No  . Sexually Active: No   Other Topics Concern  . Not on file   Social History Narrative  . No narrative on file    Review of Systems General: Ambulates with walker  No chills, fever, night sweats or weight changes Cardiovascular: No chest pain, dyspnea on exertion, edema, orthopnea, palpitations, paroxysmal nocturnal dyspnea Dermatological: No rash, lesions or masses Respiratory: No cough, dyspnea Urologic: No hematuria, dysuria Abdominal: No nausea, vomiting, diarrhea, bright red blood per rectum, melena, or hematemesis Neurologic: No visual changes, weakness, changes in mental status All other systems reviewed and are otherwise negative except as noted above.  Physical Exam Blood pressure 100/70, pulse 82, height 5\' 1"  (1.549 m), weight 211 lb 12.8 oz (96.072 kg).  General: Well developed, elderly 79 year old female in no acute distress. HEENT: Normocephalic, atraumatic. EOMs intact. Sclera nonicteric. Oropharynx clear.  Neck: Supple. No JVD. Lungs: Respirations regular and unlabored, CTA bilaterally. No wheezes, rales or rhonchi. Heart: Irregular. S1, S2 present. No murmurs, rub, S3 or S4. Abdomen: Soft, non-distended.  Extremities: No clubbing, cyanosis or edema. Venous stasis changes noted. Right knee with large 4 x 5 cm ulcerated wound with some eschar and just inferiorly there is a draining incision with dressings in place.  Psych: Normal affect. Neuro: Alert and oriented X 3. Moves all extremities spontaneously.    Assessment and Plan 1. Large right LE hematoma after mechanical fall Currently having repeat I&D procedures to promote healing  Active bleeding Cannot restart Pradaxa at this time Will reassess in 2-3  weeks 2. Atrial fibrillation Asymptomatic Continue CCB for rate control No anticoagulation at this time, as outlined above  Signed, Tempie Gibeault, PA-C 12/18/2012, 2:22 PM

## 2012-12-22 DIAGNOSIS — L97809 Non-pressure chronic ulcer of other part of unspecified lower leg with unspecified severity: Secondary | ICD-10-CM | POA: Diagnosis not present

## 2012-12-23 DIAGNOSIS — L98499 Non-pressure chronic ulcer of skin of other sites with unspecified severity: Secondary | ICD-10-CM | POA: Diagnosis not present

## 2012-12-26 NOTE — Anesthesia Preprocedure Evaluation (Addendum)
Anesthesia Evaluation  Patient identified by MRN, date of birth, ID band Patient awake    Reviewed: Allergy & Precautions, H&P , NPO status , Patient's Chart, lab work & pertinent test results  Airway Mallampati: II TM Distance: >3 FB Neck ROM: Full    Dental  (+) Teeth Intact, Poor Dentition and Missing   Pulmonary sleep apnea , pneumonia -,  breath sounds clear to auscultation        Cardiovascular hypertension, + Peripheral Vascular Disease and +CHF + dysrhythmias Atrial Fibrillation Rhythm:Irregular Rate:Abnormal     Neuro/Psych negative neurological ROS  negative psych ROS   GI/Hepatic negative GI ROS, Neg liver ROS,   Endo/Other  diabetesHypothyroidism   Renal/GU negative Renal ROS  negative genitourinary   Musculoskeletal negative musculoskeletal ROS (+)   Abdominal   Peds  Hematology negative hematology ROS (+)   Anesthesia Other Findings   Reproductive/Obstetrics                          Anesthesia Physical Anesthesia Plan  ASA: III  Anesthesia Plan: General   Post-op Pain Management:    Induction: Intravenous  Airway Management Planned: Oral ETT and LMA  Additional Equipment:   Intra-op Plan:   Post-operative Plan: Extubation in OR  Informed Consent: I have reviewed the patients History and Physical, chart, labs and discussed the procedure including the risks, benefits and alternatives for the proposed anesthesia with the patient or authorized representative who has indicated his/her understanding and acceptance.   Dental advisory given  Plan Discussed with: CRNA  Anesthesia Plan Comments:        Anesthesia Quick Evaluation

## 2012-12-29 ENCOUNTER — Other Ambulatory Visit (HOSPITAL_COMMUNITY): Payer: Self-pay | Admitting: *Deleted

## 2012-12-29 ENCOUNTER — Encounter (HOSPITAL_COMMUNITY): Payer: Self-pay | Admitting: *Deleted

## 2012-12-29 ENCOUNTER — Encounter (HOSPITAL_COMMUNITY): Payer: Self-pay | Admitting: Pharmacy Technician

## 2012-12-29 ENCOUNTER — Other Ambulatory Visit: Payer: Self-pay | Admitting: Plastic Surgery

## 2012-12-29 DIAGNOSIS — L97812 Non-pressure chronic ulcer of other part of right lower leg with fat layer exposed: Secondary | ICD-10-CM

## 2012-12-29 NOTE — H&P (Signed)
   This document contains confidential information from a [facility] medical record system and may be unauthenticated. Release may be made only with a valid authorization or in accordance with applicable policies of Medical Center or its affiliates. This document must be maintained in a secure manner or discarded/destroyed as required by Medical Center policy or by a confidential means such as shredding.     Terrika Hopkin  12/22/2012 1:30 PM   Initial consult  MRN:  [id number]  Provider: Claire Sanger, DO  Department:  Plastic Surgery  Dept Phone: [phone]    Visit Coverage      Payor Plan Sponsor Code Group Number Group Name    MEDICARE MEDICARE PART A&B            Primary Coverage      Payor Plan Sponsor Code Group Number Group Name    MEDICARE MEDICARE PART A&B         Diagnoses   Ulcer of knee, right, with necrosis of muscle (HCC)    -  Primary    707.19     Reason for Visit   Skin Problem    Vitals - Last Recorded    143/56  99  20           Subjective:    Patient ID: [name] is a 79 y.o. female.  HPI The patient is a 79 yrs old wf here with the representative from the nursing facility.  She states she fell and sustained a hematoma to the right knee.  She was seen by Dr. Graves and referred to us.  A portion of the hematoma was evacuated earlier today.  The incident occurred at a theater in [place] on May 31. She had a black escar over the area.  It was debrided and packed.  The area is ~ 4 x 4 but undermines to 8 x 6 cm. There is no sign of infection.  She has swelling of both legs and likely venous insufficiency.  She complains of pain in the area.she has diabetes, hypothyroidism, heart disease and atrial fibrillation. She takes Pradaxa.  The following portions of the patient's history were reviewed and updated as appropriate: allergies, current medications, past family history, past medical history, past social history, past surgical history and problem  list.   Review of Systems  Constitutional: Negative.   HENT: Negative.   Eyes: Negative.   Respiratory: Negative.   Cardiovascular: Negative.   Gastrointestinal: Negative.   Endocrine: Negative.   Genitourinary: Negative.   Skin: Positive for wound.  Psychiatric/Behavioral: Negative.       Objective:    Physical Exam  Constitutional: She appears well-developed and well-nourished.  HENT:   Head: Normocephalic and atraumatic.  Eyes: EOM are normal. Pupils are equal, round, and reactive to light.  Pulmonary/Chest: Effort normal.  Musculoskeletal:       Legs: Neurological: She is alert.  Skin: There is erythema.  Psychiatric: She has a normal mood and affect. Her behavior is normal. Judgment and thought content normal.   Assessment:   Right knee ulcer     Plan:     Debridement and packing with plan for OR debridement, Acell and VAC placement.      

## 2012-12-31 ENCOUNTER — Ambulatory Visit (HOSPITAL_BASED_OUTPATIENT_CLINIC_OR_DEPARTMENT_OTHER): Admission: RE | Admit: 2012-12-31 | Payer: Medicare Other | Source: Ambulatory Visit | Admitting: Plastic Surgery

## 2012-12-31 ENCOUNTER — Encounter (HOSPITAL_COMMUNITY)
Admission: RE | Disposition: A | Discharge status: Skilled Nursing Facility | Payer: Self-pay | Source: Ambulatory Visit | Attending: Plastic Surgery

## 2012-12-31 ENCOUNTER — Ambulatory Visit (HOSPITAL_COMMUNITY): Payer: Medicare Other

## 2012-12-31 ENCOUNTER — Encounter (HOSPITAL_COMMUNITY): Payer: Self-pay | Admitting: Plastic Surgery

## 2012-12-31 ENCOUNTER — Encounter (HOSPITAL_COMMUNITY): Payer: Self-pay | Admitting: Anesthesiology

## 2012-12-31 ENCOUNTER — Encounter (HOSPITAL_BASED_OUTPATIENT_CLINIC_OR_DEPARTMENT_OTHER): Admission: RE | Payer: Self-pay | Source: Ambulatory Visit

## 2012-12-31 ENCOUNTER — Ambulatory Visit (HOSPITAL_COMMUNITY): Payer: Medicare Other | Admitting: Anesthesiology

## 2012-12-31 ENCOUNTER — Ambulatory Visit (HOSPITAL_COMMUNITY)
Admission: RE | Admit: 2012-12-31 | Discharge: 2012-12-31 | Disposition: A | Discharge status: Skilled Nursing Facility | Payer: Medicare Other | Source: Ambulatory Visit | Attending: Plastic Surgery | Admitting: Plastic Surgery

## 2012-12-31 DIAGNOSIS — X58XXXA Exposure to other specified factors, initial encounter: Secondary | ICD-10-CM | POA: Insufficient documentation

## 2012-12-31 DIAGNOSIS — L980 Pyogenic granuloma: Secondary | ICD-10-CM | POA: Diagnosis not present

## 2012-12-31 DIAGNOSIS — E039 Hypothyroidism, unspecified: Secondary | ICD-10-CM | POA: Insufficient documentation

## 2012-12-31 DIAGNOSIS — S81009A Unspecified open wound, unspecified knee, initial encounter: Secondary | ICD-10-CM | POA: Diagnosis not present

## 2012-12-31 DIAGNOSIS — Z01818 Encounter for other preprocedural examination: Secondary | ICD-10-CM | POA: Diagnosis not present

## 2012-12-31 DIAGNOSIS — M7989 Other specified soft tissue disorders: Secondary | ICD-10-CM | POA: Diagnosis not present

## 2012-12-31 DIAGNOSIS — L97809 Non-pressure chronic ulcer of other part of unspecified lower leg with unspecified severity: Secondary | ICD-10-CM | POA: Diagnosis not present

## 2012-12-31 DIAGNOSIS — K56609 Unspecified intestinal obstruction, unspecified as to partial versus complete obstruction: Secondary | ICD-10-CM | POA: Diagnosis not present

## 2012-12-31 DIAGNOSIS — I10 Essential (primary) hypertension: Secondary | ICD-10-CM | POA: Diagnosis not present

## 2012-12-31 DIAGNOSIS — S91009A Unspecified open wound, unspecified ankle, initial encounter: Secondary | ICD-10-CM | POA: Diagnosis not present

## 2012-12-31 DIAGNOSIS — E119 Type 2 diabetes mellitus without complications: Secondary | ICD-10-CM | POA: Insufficient documentation

## 2012-12-31 DIAGNOSIS — L089 Local infection of the skin and subcutaneous tissue, unspecified: Secondary | ICD-10-CM | POA: Diagnosis not present

## 2012-12-31 DIAGNOSIS — S81809A Unspecified open wound, unspecified lower leg, initial encounter: Secondary | ICD-10-CM | POA: Insufficient documentation

## 2012-12-31 DIAGNOSIS — L918 Other hypertrophic disorders of the skin: Secondary | ICD-10-CM | POA: Diagnosis not present

## 2012-12-31 DIAGNOSIS — L97812 Non-pressure chronic ulcer of other part of right lower leg with fat layer exposed: Secondary | ICD-10-CM

## 2012-12-31 DIAGNOSIS — I4891 Unspecified atrial fibrillation: Secondary | ICD-10-CM | POA: Insufficient documentation

## 2012-12-31 DIAGNOSIS — L97909 Non-pressure chronic ulcer of unspecified part of unspecified lower leg with unspecified severity: Secondary | ICD-10-CM | POA: Diagnosis not present

## 2012-12-31 DIAGNOSIS — Z79899 Other long term (current) drug therapy: Secondary | ICD-10-CM | POA: Insufficient documentation

## 2012-12-31 HISTORY — PX: I & D EXTREMITY: SHX5045

## 2012-12-31 LAB — GLUCOSE, CAPILLARY
Glucose-Capillary: 99 mg/dL (ref 70–99)
Glucose-Capillary: 83 mg/dL (ref 70–99)

## 2012-12-31 SURGERY — IRRIGATION AND DEBRIDEMENT EXTREMITY
Anesthesia: General | Site: Knee | Laterality: Right

## 2012-12-31 SURGERY — IRRIGATION AND DEBRIDEMENT EXTREMITY
Anesthesia: General | Site: Knee | Laterality: Right | Wound class: Dirty or Infected

## 2012-12-31 MED ORDER — PROMETHAZINE HCL 25 MG/ML IJ SOLN: 6.2500 mg | INTRAMUSCULAR | Status: DC | PRN

## 2012-12-31 MED ORDER — DILTIAZEM HCL 25 MG/5ML IV SOLN
INTRAVENOUS | Status: AC
  Filled 2012-12-31: qty 5

## 2012-12-31 MED ORDER — FENTANYL CITRATE 0.05 MG/ML IJ SOLN
INTRAMUSCULAR | Status: DC | PRN
  Administered 2012-12-31 (×3): 25 ug via INTRAVENOUS
  Administered 2012-12-31: 50 ug via INTRAVENOUS
  Administered 2012-12-31 (×3): 25 ug via INTRAVENOUS

## 2012-12-31 MED ORDER — FENTANYL CITRATE 0.05 MG/ML IJ SOLN: 25.0000 ug | INTRAMUSCULAR | Status: DC | PRN

## 2012-12-31 MED ORDER — CEFAZOLIN SODIUM-DEXTROSE 2-3 GM-% IV SOLR
2.0000 g | INTRAVENOUS | Status: AC
  Administered 2012-12-31: 2 g via INTRAVENOUS

## 2012-12-31 MED ORDER — CEFAZOLIN SODIUM-DEXTROSE 2-3 GM-% IV SOLR
INTRAVENOUS | Status: AC
  Filled 2012-12-31: qty 50

## 2012-12-31 MED ORDER — PROPOFOL 10 MG/ML IV BOLUS
INTRAVENOUS | Status: DC | PRN
  Administered 2012-12-31: 120 mg via INTRAVENOUS
  Administered 2012-12-31: 50 mg via INTRAVENOUS

## 2012-12-31 MED ORDER — LACTATED RINGERS IV SOLN: INTRAVENOUS | Status: DC

## 2012-12-31 MED ORDER — DILTIAZEM HCL 25 MG/5ML IV SOLN
INTRAVENOUS | Status: DC | PRN
  Administered 2012-12-31: 5 mg via INTRAVENOUS

## 2012-12-31 MED ORDER — LACTATED RINGERS IV SOLN
INTRAVENOUS | Status: DC
  Administered 2012-12-31: 13:00:00 via INTRAVENOUS
  Administered 2012-12-31: 1000 mL via INTRAVENOUS

## 2012-12-31 MED ORDER — BUPIVACAINE-EPINEPHRINE 0.25% -1:200000 IJ SOLN
INTRAMUSCULAR | Status: AC
  Filled 2012-12-31: qty 1

## 2012-12-31 MED ORDER — MINERAL OIL LIGHT 100 % EX OIL
TOPICAL_OIL | CUTANEOUS | Status: AC
  Filled 2012-12-31: qty 25

## 2012-12-31 MED ORDER — LIDOCAINE HCL (CARDIAC) 20 MG/ML IV SOLN
INTRAVENOUS | Status: DC | PRN
  Administered 2012-12-31: 50 mg via INTRAVENOUS

## 2012-12-31 MED ORDER — SODIUM CHLORIDE 0.9 % IR SOLN
Status: DC | PRN
  Administered 2012-12-31: 12:00:00

## 2012-12-31 MED ORDER — PHENYLEPHRINE HCL 10 MG/ML IJ SOLN
INTRAMUSCULAR | Status: DC | PRN
  Administered 2012-12-31: 40 ug via INTRAVENOUS
  Administered 2012-12-31: 80 ug via INTRAVENOUS
  Administered 2012-12-31: 40 ug via INTRAVENOUS

## 2012-12-31 MED ORDER — BACITRACIN ZINC 500 UNIT/GM EX OINT
TOPICAL_OINTMENT | CUTANEOUS | Status: AC
  Filled 2012-12-31: qty 15

## 2012-12-31 MED ORDER — ONDANSETRON HCL 4 MG/2ML IJ SOLN
INTRAMUSCULAR | Status: DC | PRN
  Administered 2012-12-31: 4 mg via INTRAVENOUS

## 2012-12-31 SURGICAL SUPPLY — 57 items
BAG SPEC THK2 15X12 ZIP CLS (MISCELLANEOUS)
BAG ZIPLOCK 12X15 (MISCELLANEOUS) ×1 IMPLANT
BANDAGE ELASTIC 4 VELCRO ST LF (GAUZE/BANDAGES/DRESSINGS) ×2 IMPLANT
BANDAGE GAUZE ELAST BULKY 4 IN (GAUZE/BANDAGES/DRESSINGS) ×1 IMPLANT
BLADE HEX COATED 2.75 (ELECTRODE) ×2 IMPLANT
CLOTH BEACON ORANGE TIMEOUT ST (SAFETY) ×2 IMPLANT
CORDS BIPOLAR (ELECTRODE) ×2 IMPLANT
COVER SURGICAL LIGHT HANDLE (MISCELLANEOUS) ×2 IMPLANT
CUFF TOURN SGL QUICK 18 (TOURNIQUET CUFF) ×1 IMPLANT
CUFF TOURN SGL QUICK 24 (TOURNIQUET CUFF)
CUFF TRNQT CYL 24X4X40X1 (TOURNIQUET CUFF) IMPLANT
DRAIN CHANNEL 19F RND (DRAIN) IMPLANT
DRAPE INCISE IOBAN 66X45 STRL (DRAPES) ×1 IMPLANT
DRAPE SURG 17X11 SM STRL (DRAPES) ×1 IMPLANT
DRSG ADAPTIC 3X8 NADH LF (GAUZE/BANDAGES/DRESSINGS) ×2 IMPLANT
DRSG PAD ABDOMINAL 8X10 ST (GAUZE/BANDAGES/DRESSINGS) ×1 IMPLANT
DURAPREP 26ML APPLICATOR (WOUND CARE) ×1 IMPLANT
ELECT REM PT RETURN 9FT ADLT (ELECTROSURGICAL) ×2
ELECTRODE REM PT RTRN 9FT ADLT (ELECTROSURGICAL) ×1 IMPLANT
EVACUATOR SILICONE 100CC (DRAIN) IMPLANT
GAUZE XEROFORM 4X4 STRL (GAUZE/BANDAGES/DRESSINGS) IMPLANT
GLOVE BIO SURGEON STRL SZ 6.5 (GLOVE) ×2 IMPLANT
GLOVE SURG ORTHO 8.0 STRL STRW (GLOVE) ×4 IMPLANT
HANDPIECE INTERPULSE COAX TIP (DISPOSABLE)
IMMOBILIZER KNEE 20 (SOFTGOODS) ×2
IMMOBILIZER KNEE 20 THIGH 36 (SOFTGOODS) IMPLANT
KIT BASIN OR (CUSTOM PROCEDURE TRAY) ×2 IMPLANT
MANIFOLD NEPTUNE II (INSTRUMENTS) ×2 IMPLANT
MATRIX SURGICAL PSMX 7X10CM (Tissue) ×1 IMPLANT
MICROMATRIX 1000MG (Tissue) ×4 IMPLANT
MICROMATRIX 500MG (Tissue) ×2 IMPLANT
NDL HYPO 25X1 1.5 SAFETY (NEEDLE) ×1 IMPLANT
NEEDLE HYPO 22GX1.5 SAFETY (NEEDLE) ×2 IMPLANT
NEEDLE HYPO 25X1 1.5 SAFETY (NEEDLE) ×2 IMPLANT
NS IRRIG 1000ML POUR BTL (IV SOLUTION) ×2 IMPLANT
PACK LOWER EXTREMITY WL (CUSTOM PROCEDURE TRAY) ×2 IMPLANT
PAD CAST 3X4 CTTN HI CHSV (CAST SUPPLIES) ×1 IMPLANT
PAD CAST 4YDX4 CTTN HI CHSV (CAST SUPPLIES) ×2 IMPLANT
PADDING CAST ABS 4INX4YD NS (CAST SUPPLIES)
PADDING CAST ABS COTTON 4X4 ST (CAST SUPPLIES) ×2 IMPLANT
PADDING CAST COTTON 3X4 STRL (CAST SUPPLIES)
PADDING CAST COTTON 4X4 STRL (CAST SUPPLIES)
SET HNDPC FAN SPRY TIP SCT (DISPOSABLE) ×1 IMPLANT
SOL PREP POV-IOD 16OZ 10% (MISCELLANEOUS) ×2 IMPLANT
SOLUTION PARTIC MCRMTRX 1000MG (Tissue) IMPLANT
SOLUTION PARTIC MCRMTRX 500MG (Tissue) IMPLANT
SPLINT FIBERGLASS 4X15 (CAST SUPPLIES) IMPLANT
SPONGE GAUZE 4X4 12PLY (GAUZE/BANDAGES/DRESSINGS) ×2 IMPLANT
STAPLER VISISTAT 35W (STAPLE) ×2 IMPLANT
SUT SILK 0 FSL (SUTURE) ×1 IMPLANT
SUT VIC AB 3-0 SH 27 (SUTURE) ×2
SUT VIC AB 3-0 SH 27X BRD (SUTURE) IMPLANT
SUT VIC AB 5-0 PS2 18 (SUTURE) ×2 IMPLANT
SWAB COLLECTION DEVICE MRSA (MISCELLANEOUS) ×1 IMPLANT
SYR CONTROL 10ML LL (SYRINGE) ×2 IMPLANT
TUBE ANAEROBIC SPECIMEN COL (MISCELLANEOUS) ×2 IMPLANT
WATER STERILE IRR 1500ML POUR (IV SOLUTION) ×2 IMPLANT

## 2012-12-31 NOTE — Interval H&P Note (Signed)
History and Physical Interval Note:  12/31/2012 7:10 AM  [name]  has presented today for surgery, with the diagnosis of ULCER ON RIGHT KNEE   The various methods of treatment have been discussed with the patient and family. After consideration of risks, benefits and other options for treatment, the patient has consented to  Procedure(s): IRRIGATION AND DEBRIDEMENT RIGHT KNEE ULCER WITH PLACEMENT OF A CELL AND VAC  (Right) as a surgical intervention .  The patient's history has been reviewed, patient examined, no change in status, stable for surgery.  I have reviewed the patient's chart and labs.  Questions were answered to the patient's satisfaction.     SANGER,Malyna Budney

## 2012-12-31 NOTE — Discharge Instructions (Signed)
° ° ° ° ° °  Jini Hoying  ARRIVE AT [facility] - SHORT STAY [facility]ENTER -AT 8:00 AM ON 12/31/12  NOTHING TO EAT OR DRINK AFTER MIDNIGHT EXCEPT TO TAKE CARDIZEM / SYNTHROID / KEFLEX / MAY TAKE NORCO FOR PAIN IF NEEDED  FOLLOW BETASEPT CLEANSING PROTOCOL INCLUDED IN THIS PACKET.  LEAVE VALUABLES AT HOME (JEWELRY AND MONEY)  CALL THIS NUMBER IF ANY QUESTIONS OR CONCERNS:  [phone] - SHORT STAY  [phone] - PRESURGICAL TESTING  Do not change VAC

## 2012-12-31 NOTE — Anesthesia Postprocedure Evaluation (Signed)
Anesthesia Post Note  Patient: [name]  Procedure(s) Performed: Procedure(s) (LRB): IRRIGATION AND DEBRIDEMENT RIGHT KNEE ULCER WITH PLACEMENT OF A CELL AND VAC  (Right)  Anesthesia type: General  Patient location: PACU  Post pain: Pain level controlled  Post assessment: Post-op Vital signs reviewed  Last Vitals:  Filed Vitals:   12/31/12 1413  BP: 114/60  Pulse: 90  Temp: 36.6 C  Resp:     Post vital signs: Reviewed  Level of consciousness: sedated  Complications: No apparent anesthesia complications

## 2012-12-31 NOTE — H&P (View-Only) (Signed)
   This document contains confidential information from a [facility] medical record system and may be unauthenticated. Release may be made only with a valid authorization or in accordance with applicable policies of Medical Center or its affiliates. This document must be maintained in a secure manner or discarded/destroyed as required by Medical Center policy or by a confidential means such as shredding.     Alizee Maple  12/22/2012 1:30 PM   Initial consult  MRN:  [id number]  Provider: Wayland Denis, DO  Department:  Plastic Surgery  Dept Phone: [phone]    Visit Coverage      Payor Plan Sponsor Code Group Number Group Name    MEDICARE MEDICARE PART A&B            Primary Coverage      Payor Plan Sponsor Code Group Number Group Name    MEDICARE MEDICARE PART A&B         Diagnoses   Ulcer of knee, right, with necrosis of muscle (HCC)    -  Primary    707.19     Reason for Visit   Skin Problem    Vitals - Last Recorded    143/56  99  20           Subjective:    Patient ID: [name] is a 79 y.o. female.  HPI The patient is a 79 yrs old wf here with the representative from the nursing facility.  She states she fell and sustained a hematoma to the right knee.  She was seen by Dr. Luiz Blare and referred to [place].  A portion of the hematoma was evacuated earlier today.  The incident occurred at a theater in [facility] on May 31. She had a black escar over the area.  It was debrided and packed.  The area is ~ 4 x 4 but undermines to 8 x 6 cm. There is no sign of infection.  She has swelling of both legs and likely venous insufficiency.  She complains of pain in the area.she has diabetes, hypothyroidism, heart disease and atrial fibrillation. She takes Pradaxa.  The following portions of the patient's history were reviewed and updated as appropriate: allergies, current medications, past family history, past medical history, past social history, past surgical history and problem  list.   Review of Systems  Constitutional: Negative.   HENT: Negative.   Eyes: Negative.   Respiratory: Negative.   Cardiovascular: Negative.   Gastrointestinal: Negative.   Endocrine: Negative.   Genitourinary: Negative.   Skin: Positive for wound.  Psychiatric/Behavioral: Negative.       Objective:    Physical Exam  Constitutional: She appears well-developed and well-nourished.  HENT:   Head: Normocephalic and atraumatic.  Eyes: EOM are normal. Pupils are equal, round, and reactive to light.  Pulmonary/Chest: Effort normal.  Musculoskeletal:       Legs: Neurological: She is alert.  Skin: There is erythema.  Psychiatric: She has a normal mood and affect. Her behavior is normal. Judgment and thought content normal.   Assessment:   Right knee ulcer     Plan:     Debridement and packing with plan for OR debridement, Acell and VAC placement.

## 2012-12-31 NOTE — Interval H&P Note (Signed)
History and Physical Interval Note:  12/31/2012 11:16 AM  [name]  has presented today for surgery, with the diagnosis of ULCER ON RIGHT KNEE   The various methods of treatment have been discussed with the patient and family. After consideration of risks, benefits and other options for treatment, the patient has consented to  Procedure(s): IRRIGATION AND DEBRIDEMENT RIGHT KNEE ULCER WITH PLACEMENT OF A CELL AND VAC  (Right) as a surgical intervention .  The patient's history has been reviewed, patient examined, no change in status, stable for surgery.  I have reviewed the patient's chart and labs.  Questions were answered to the patient's satisfaction.     SANGER,Fatisha Rabalais

## 2012-12-31 NOTE — Brief Op Note (Signed)
12/31/2012  1:09 PM  PATIENT:  [name]  79 y.o. female  PRE-OPERATIVE DIAGNOSIS:  ULCER ON RIGHT KNEE   POST-OPERATIVE DIAGNOSIS:  right knee ulcer  PROCEDURE:  Procedure(s): IRRIGATION AND DEBRIDEMENT RIGHT KNEE ULCER WITH PLACEMENT OF A CELL AND VAC  (Right)  SURGEON:  Surgeon(s) and Role:    * Bradie Sangiovanni Sanger, DO - Primary  PHYSICIAN ASSISTANT: none  ASSISTANTS: none   ANESTHESIA:   general  EBL:  Total I/O In: 1200 [I.V.:1200] Out: -   BLOOD ADMINISTERED:none  DRAINS: none   LOCAL MEDICATIONS USED:  NONE  SPECIMEN:  Source of Specimen:  right knee fluid  DISPOSITION OF SPECIMEN:  micro  COUNTS:  YES  TOURNIQUET:    DICTATION: .Dragon Dictation  PLAN OF CARE: Discharge to home after PACU  PATIENT DISPOSITION:  PACU - hemodynamically stable.   Delay start of Pharmacological VTE agent (>24hrs) due to surgical blood loss or risk of bleeding: no

## 2012-12-31 NOTE — Progress Notes (Signed)
[facility] here to pick up [name].  Pt is discharged back to Facility via [facility].

## 2012-12-31 NOTE — Transfer of Care (Signed)
Immediate Anesthesia Transfer of Care Note  Patient: [name]  Procedure(s) Performed: Procedure(s): IRRIGATION AND DEBRIDEMENT RIGHT KNEE ULCER WITH PLACEMENT OF A CELL AND VAC  (Right)  Patient Location: PACU  Anesthesia Type:General  Level of Consciousness: awake, alert  and oriented  Airway & Oxygen Therapy: Patient Spontanous Breathing and Patient connected to face mask oxygen  Post-op Assessment: Report given to PACU RN and Post -op Vital signs reviewed and stable  Post vital signs: Reviewed and stable  Complications: No apparent anesthesia complications

## 2012-12-31 NOTE — Op Note (Signed)
Operative Note   SURGICAL DIVISION: Plastic Surgery  PREOPERATIVE DIAGNOSES:  Right knee wound  POSTOPERATIVE DIAGNOSES:  same   PROCEDURE:  Irrigation and debridement skin, subcutaneous tissue and muscle of right knee wound with ACell and VAC 7 x 10 cm with 8 cm undermining.  SURGEON: Wayland Denis, DO  ANESTHESIA:  General.   COMPLICATIONS: None.   INDICATIONS FOR PROCEDURE: knee ulcer   CONSENT:  Informed consent was obtained directly from the patient. Risks, benefits and alternatives were fully discussed. Specific risks including but not limited to bleeding, infection, hematoma, seroma, scarring, pain, implant infection, implant extrusion, capsular contracture, asymmetry, wound healing problems, and need for further surgery were all discussed. The patient did have an ample opportunity to have her questions answered to her satisfaction.   DESCRIPTION OF PROCEDURE:  The patient was taken to the operating room. SCDs were placed and IV antibiotics were given. The patient's operative site was prepped and draped in a sterile fashion. A time out was performed and all information was confirmed to be correct.  The knee was debrided with curette, #10 blade and the bovie.  Cultures were obtained and sent for gram stain and sensitivity.  The leg was irrigated with antibiotic solution and saline.  Hemostasis was achieved with electrocautery.  The undermining was 8 cm superior and inferior.  2.5 gm of Acell powder was placed with a 7 x 10 cm sheet was placed in the wound.  The sheet was prepared according to the manufacture guideline and attached to the skin with 5-0 Vicryl. Adaptic and surgical lube was applied with the VAC and a knee immobilizer.  The patient was allowed to wake from anesthesia, extubated and taken to the recovery room in satisfactory condition.

## 2013-01-04 LAB — WOUND CULTURE

## 2013-01-05 LAB — ANAEROBIC CULTURE

## 2013-01-09 DIAGNOSIS — L02419 Cutaneous abscess of limb, unspecified: Secondary | ICD-10-CM | POA: Diagnosis not present

## 2013-01-09 DIAGNOSIS — I4891 Unspecified atrial fibrillation: Secondary | ICD-10-CM | POA: Diagnosis not present

## 2013-01-09 DIAGNOSIS — Z4889 Encounter for other specified surgical aftercare: Secondary | ICD-10-CM | POA: Diagnosis not present

## 2013-01-09 DIAGNOSIS — R609 Edema, unspecified: Secondary | ICD-10-CM | POA: Diagnosis not present

## 2013-01-13 ENCOUNTER — Ambulatory Visit: Payer: Medicare Other | Admitting: Cardiology

## 2013-01-15 ENCOUNTER — Ambulatory Visit: Payer: Medicare Other | Admitting: Cardiology

## 2013-01-20 DIAGNOSIS — L97809 Non-pressure chronic ulcer of other part of unspecified lower leg with unspecified severity: Secondary | ICD-10-CM | POA: Diagnosis not present

## 2013-01-27 DIAGNOSIS — L97809 Non-pressure chronic ulcer of other part of unspecified lower leg with unspecified severity: Secondary | ICD-10-CM | POA: Diagnosis not present

## 2013-02-18 DIAGNOSIS — Z7901 Long term (current) use of anticoagulants: Secondary | ICD-10-CM | POA: Diagnosis not present

## 2013-02-18 DIAGNOSIS — I509 Heart failure, unspecified: Secondary | ICD-10-CM | POA: Diagnosis not present

## 2013-02-18 DIAGNOSIS — E119 Type 2 diabetes mellitus without complications: Secondary | ICD-10-CM | POA: Diagnosis not present

## 2013-02-18 DIAGNOSIS — I4891 Unspecified atrial fibrillation: Secondary | ICD-10-CM | POA: Diagnosis not present

## 2013-02-18 DIAGNOSIS — I5032 Chronic diastolic (congestive) heart failure: Secondary | ICD-10-CM | POA: Diagnosis not present

## 2013-02-18 DIAGNOSIS — Z4800 Encounter for change or removal of nonsurgical wound dressing: Secondary | ICD-10-CM | POA: Diagnosis not present

## 2013-02-18 DIAGNOSIS — Z9181 History of falling: Secondary | ICD-10-CM | POA: Diagnosis not present

## 2013-02-18 DIAGNOSIS — S8000XA Contusion of unspecified knee, initial encounter: Secondary | ICD-10-CM | POA: Diagnosis not present

## 2013-02-19 DIAGNOSIS — I4891 Unspecified atrial fibrillation: Secondary | ICD-10-CM | POA: Diagnosis not present

## 2013-02-19 DIAGNOSIS — Z7901 Long term (current) use of anticoagulants: Secondary | ICD-10-CM | POA: Diagnosis not present

## 2013-02-19 DIAGNOSIS — I5032 Chronic diastolic (congestive) heart failure: Secondary | ICD-10-CM | POA: Diagnosis not present

## 2013-02-19 DIAGNOSIS — S8000XA Contusion of unspecified knee, initial encounter: Secondary | ICD-10-CM | POA: Diagnosis not present

## 2013-02-19 DIAGNOSIS — I509 Heart failure, unspecified: Secondary | ICD-10-CM | POA: Diagnosis not present

## 2013-02-19 DIAGNOSIS — E119 Type 2 diabetes mellitus without complications: Secondary | ICD-10-CM | POA: Diagnosis not present

## 2013-02-20 DIAGNOSIS — S8000XA Contusion of unspecified knee, initial encounter: Secondary | ICD-10-CM | POA: Diagnosis not present

## 2013-02-20 DIAGNOSIS — I509 Heart failure, unspecified: Secondary | ICD-10-CM | POA: Diagnosis not present

## 2013-02-20 DIAGNOSIS — E119 Type 2 diabetes mellitus without complications: Secondary | ICD-10-CM | POA: Diagnosis not present

## 2013-02-20 DIAGNOSIS — I4891 Unspecified atrial fibrillation: Secondary | ICD-10-CM | POA: Diagnosis not present

## 2013-02-20 DIAGNOSIS — Z7901 Long term (current) use of anticoagulants: Secondary | ICD-10-CM | POA: Diagnosis not present

## 2013-02-20 DIAGNOSIS — I5032 Chronic diastolic (congestive) heart failure: Secondary | ICD-10-CM | POA: Diagnosis not present

## 2013-02-23 DIAGNOSIS — I509 Heart failure, unspecified: Secondary | ICD-10-CM | POA: Diagnosis not present

## 2013-02-23 DIAGNOSIS — I4891 Unspecified atrial fibrillation: Secondary | ICD-10-CM | POA: Diagnosis not present

## 2013-02-23 DIAGNOSIS — S8000XA Contusion of unspecified knee, initial encounter: Secondary | ICD-10-CM | POA: Diagnosis not present

## 2013-02-23 DIAGNOSIS — Z7901 Long term (current) use of anticoagulants: Secondary | ICD-10-CM | POA: Diagnosis not present

## 2013-02-23 DIAGNOSIS — E119 Type 2 diabetes mellitus without complications: Secondary | ICD-10-CM | POA: Diagnosis not present

## 2013-02-23 DIAGNOSIS — I5032 Chronic diastolic (congestive) heart failure: Secondary | ICD-10-CM | POA: Diagnosis not present

## 2013-02-25 DIAGNOSIS — S8000XA Contusion of unspecified knee, initial encounter: Secondary | ICD-10-CM | POA: Diagnosis not present

## 2013-02-25 DIAGNOSIS — I4891 Unspecified atrial fibrillation: Secondary | ICD-10-CM | POA: Diagnosis not present

## 2013-02-25 DIAGNOSIS — Z7901 Long term (current) use of anticoagulants: Secondary | ICD-10-CM | POA: Diagnosis not present

## 2013-02-25 DIAGNOSIS — I5032 Chronic diastolic (congestive) heart failure: Secondary | ICD-10-CM | POA: Diagnosis not present

## 2013-02-25 DIAGNOSIS — I509 Heart failure, unspecified: Secondary | ICD-10-CM | POA: Diagnosis not present

## 2013-02-25 DIAGNOSIS — E119 Type 2 diabetes mellitus without complications: Secondary | ICD-10-CM | POA: Diagnosis not present

## 2013-02-27 DIAGNOSIS — E876 Hypokalemia: Secondary | ICD-10-CM | POA: Diagnosis not present

## 2013-02-27 DIAGNOSIS — Z23 Encounter for immunization: Secondary | ICD-10-CM | POA: Diagnosis not present

## 2013-02-27 DIAGNOSIS — I4891 Unspecified atrial fibrillation: Secondary | ICD-10-CM | POA: Diagnosis not present

## 2013-02-27 DIAGNOSIS — E785 Hyperlipidemia, unspecified: Secondary | ICD-10-CM | POA: Diagnosis not present

## 2013-02-27 DIAGNOSIS — I10 Essential (primary) hypertension: Secondary | ICD-10-CM | POA: Diagnosis not present

## 2013-02-27 DIAGNOSIS — D649 Anemia, unspecified: Secondary | ICD-10-CM | POA: Diagnosis not present

## 2013-02-27 DIAGNOSIS — E039 Hypothyroidism, unspecified: Secondary | ICD-10-CM | POA: Diagnosis not present

## 2013-02-27 DIAGNOSIS — E118 Type 2 diabetes mellitus with unspecified complications: Secondary | ICD-10-CM | POA: Diagnosis not present

## 2013-03-02 DIAGNOSIS — I509 Heart failure, unspecified: Secondary | ICD-10-CM | POA: Diagnosis not present

## 2013-03-02 DIAGNOSIS — Z7901 Long term (current) use of anticoagulants: Secondary | ICD-10-CM | POA: Diagnosis not present

## 2013-03-02 DIAGNOSIS — S8000XA Contusion of unspecified knee, initial encounter: Secondary | ICD-10-CM | POA: Diagnosis not present

## 2013-03-02 DIAGNOSIS — I5032 Chronic diastolic (congestive) heart failure: Secondary | ICD-10-CM | POA: Diagnosis not present

## 2013-03-02 DIAGNOSIS — E119 Type 2 diabetes mellitus without complications: Secondary | ICD-10-CM | POA: Diagnosis not present

## 2013-03-02 DIAGNOSIS — I4891 Unspecified atrial fibrillation: Secondary | ICD-10-CM | POA: Diagnosis not present

## 2013-03-04 DIAGNOSIS — I4891 Unspecified atrial fibrillation: Secondary | ICD-10-CM | POA: Diagnosis not present

## 2013-03-04 DIAGNOSIS — S8000XA Contusion of unspecified knee, initial encounter: Secondary | ICD-10-CM | POA: Diagnosis not present

## 2013-03-04 DIAGNOSIS — I509 Heart failure, unspecified: Secondary | ICD-10-CM | POA: Diagnosis not present

## 2013-03-04 DIAGNOSIS — E119 Type 2 diabetes mellitus without complications: Secondary | ICD-10-CM | POA: Diagnosis not present

## 2013-03-04 DIAGNOSIS — I5032 Chronic diastolic (congestive) heart failure: Secondary | ICD-10-CM | POA: Diagnosis not present

## 2013-03-04 DIAGNOSIS — Z7901 Long term (current) use of anticoagulants: Secondary | ICD-10-CM | POA: Diagnosis not present

## 2013-03-06 DIAGNOSIS — E119 Type 2 diabetes mellitus without complications: Secondary | ICD-10-CM | POA: Diagnosis not present

## 2013-03-06 DIAGNOSIS — I509 Heart failure, unspecified: Secondary | ICD-10-CM | POA: Diagnosis not present

## 2013-03-06 DIAGNOSIS — I4891 Unspecified atrial fibrillation: Secondary | ICD-10-CM | POA: Diagnosis not present

## 2013-03-06 DIAGNOSIS — I5032 Chronic diastolic (congestive) heart failure: Secondary | ICD-10-CM | POA: Diagnosis not present

## 2013-03-06 DIAGNOSIS — S8000XA Contusion of unspecified knee, initial encounter: Secondary | ICD-10-CM | POA: Diagnosis not present

## 2013-03-06 DIAGNOSIS — Z7901 Long term (current) use of anticoagulants: Secondary | ICD-10-CM | POA: Diagnosis not present

## 2013-03-09 DIAGNOSIS — I5032 Chronic diastolic (congestive) heart failure: Secondary | ICD-10-CM | POA: Diagnosis not present

## 2013-03-09 DIAGNOSIS — E119 Type 2 diabetes mellitus without complications: Secondary | ICD-10-CM | POA: Diagnosis not present

## 2013-03-09 DIAGNOSIS — S8000XA Contusion of unspecified knee, initial encounter: Secondary | ICD-10-CM | POA: Diagnosis not present

## 2013-03-09 DIAGNOSIS — I4891 Unspecified atrial fibrillation: Secondary | ICD-10-CM | POA: Diagnosis not present

## 2013-03-09 DIAGNOSIS — Z7901 Long term (current) use of anticoagulants: Secondary | ICD-10-CM | POA: Diagnosis not present

## 2013-03-09 DIAGNOSIS — J209 Acute bronchitis, unspecified: Secondary | ICD-10-CM | POA: Diagnosis not present

## 2013-03-09 DIAGNOSIS — I509 Heart failure, unspecified: Secondary | ICD-10-CM | POA: Diagnosis not present

## 2013-03-16 DIAGNOSIS — Z7901 Long term (current) use of anticoagulants: Secondary | ICD-10-CM | POA: Diagnosis not present

## 2013-03-16 DIAGNOSIS — I5032 Chronic diastolic (congestive) heart failure: Secondary | ICD-10-CM | POA: Diagnosis not present

## 2013-03-16 DIAGNOSIS — S8000XA Contusion of unspecified knee, initial encounter: Secondary | ICD-10-CM | POA: Diagnosis not present

## 2013-03-16 DIAGNOSIS — I4891 Unspecified atrial fibrillation: Secondary | ICD-10-CM | POA: Diagnosis not present

## 2013-03-16 DIAGNOSIS — I509 Heart failure, unspecified: Secondary | ICD-10-CM | POA: Diagnosis not present

## 2013-03-16 DIAGNOSIS — E119 Type 2 diabetes mellitus without complications: Secondary | ICD-10-CM | POA: Diagnosis not present

## 2013-03-18 DIAGNOSIS — E119 Type 2 diabetes mellitus without complications: Secondary | ICD-10-CM | POA: Diagnosis not present

## 2013-03-18 DIAGNOSIS — I4891 Unspecified atrial fibrillation: Secondary | ICD-10-CM | POA: Diagnosis not present

## 2013-03-18 DIAGNOSIS — I5032 Chronic diastolic (congestive) heart failure: Secondary | ICD-10-CM | POA: Diagnosis not present

## 2013-03-18 DIAGNOSIS — Z7901 Long term (current) use of anticoagulants: Secondary | ICD-10-CM | POA: Diagnosis not present

## 2013-03-18 DIAGNOSIS — I509 Heart failure, unspecified: Secondary | ICD-10-CM | POA: Diagnosis not present

## 2013-03-18 DIAGNOSIS — S8000XA Contusion of unspecified knee, initial encounter: Secondary | ICD-10-CM | POA: Diagnosis not present

## 2013-03-23 ENCOUNTER — Ambulatory Visit (INDEPENDENT_AMBULATORY_CARE_PROVIDER_SITE_OTHER): Payer: Medicare Other | Admitting: Internal Medicine

## 2013-03-23 ENCOUNTER — Encounter: Payer: Self-pay | Admitting: Internal Medicine

## 2013-03-23 VITALS — BP 131/75 | HR 88 | Ht 61.0 in | Wt 207.0 lb

## 2013-03-23 DIAGNOSIS — I4891 Unspecified atrial fibrillation: Secondary | ICD-10-CM

## 2013-03-23 DIAGNOSIS — Z7901 Long term (current) use of anticoagulants: Secondary | ICD-10-CM | POA: Diagnosis not present

## 2013-03-23 DIAGNOSIS — I5032 Chronic diastolic (congestive) heart failure: Secondary | ICD-10-CM

## 2013-03-23 DIAGNOSIS — S8000XA Contusion of unspecified knee, initial encounter: Secondary | ICD-10-CM | POA: Diagnosis not present

## 2013-03-23 DIAGNOSIS — I10 Essential (primary) hypertension: Secondary | ICD-10-CM | POA: Diagnosis not present

## 2013-03-23 DIAGNOSIS — I509 Heart failure, unspecified: Secondary | ICD-10-CM | POA: Diagnosis not present

## 2013-03-23 DIAGNOSIS — E119 Type 2 diabetes mellitus without complications: Secondary | ICD-10-CM | POA: Diagnosis not present

## 2013-03-23 LAB — CBC WITH DIFFERENTIAL/PLATELET
Basophils Relative: 0.2 % (ref 0.0–3.0)
Eosinophils Absolute: 0 10*3/uL (ref 0.0–0.7)
HCT: 40.1 % (ref 36.0–46.0)
Hemoglobin: 13.3 g/dL (ref 12.0–15.0)
Lymphocytes Relative: 15.9 % (ref 12.0–46.0)
Lymphs Abs: 1.3 10*3/uL (ref 0.7–4.0)
MCHC: 33 g/dL (ref 30.0–36.0)
MCV: 92.5 fl (ref 78.0–100.0)
Monocytes Absolute: 0.5 10*3/uL (ref 0.1–1.0)
Neutro Abs: 6.1 10*3/uL (ref 1.4–7.7)
RBC: 4.34 Mil/uL (ref 3.87–5.11)

## 2013-03-23 LAB — BASIC METABOLIC PANEL
CO2: 29 mEq/L (ref 19–32)
Chloride: 102 mEq/L (ref 96–112)
Glucose, Bld: 88 mg/dL (ref 70–99)
Potassium: 3.5 mEq/L (ref 3.5–5.1)
Sodium: 138 mEq/L (ref 135–145)

## 2013-03-23 MED ORDER — RAMIPRIL 10 MG PO CAPS: 10.0000 mg | ORAL_CAPSULE | Freq: Every day | ORAL | Status: DC

## 2013-03-23 MED ORDER — APIXABAN 5 MG PO TABS: 5.0000 mg | ORAL_TABLET | Freq: Two times a day (BID) | ORAL | Status: DC

## 2013-03-23 NOTE — Patient Instructions (Signed)
Your physician wants you to follow-up in: 4 months with Dr Jacquiline Doe will receive a reminder letter in the mail two months in advance. If you don't receive a letter, please call our office to schedule the follow-up appointment.   Your physician recommends that you schedule a follow-up appointment in: 4 weeks with Weston Brass, Pharm D for anticoagulation visit  Your physician recommends that you return for lab work : BMP/CBC  Your physician has recommended you make the following change in your medication:  1) Start Eliquis 5mg  twice daily

## 2013-03-23 NOTE — Progress Notes (Signed)
PCP: [name], MD  The patient presents today for routine cardiology followup.  She is doing quite well at this time.  Her GI symptoms have resolved.  She fell 6/14 and developed a significant knee injury.  She has made good but slow recovery since that time.  Today, she denies symptoms of palpitations, chest pain, shortness of breath, orthopnea, PND, dizziness, presyncope, syncope, or neurologic sequela.   She remains active despite her age. The patient feels that she is tolerating medications without difficulties and is otherwise without complaint today.   Past Medical History  Diagnosis Date  . Hypertension   . SBO (small bowel obstruction)   . Permanent atrial fibrillation     a. coumadin d/c'd => Pradaxa in 05/2012  . Chronic diastolic heart failure     a. Echo 5/12: Mild LVH, EF 55-60%, mild AI, mild MR, severe LAE, mild RAE, PASP 31, small pericardial effusion  . Venous insufficiency     chronic LE edema  . HLD (hyperlipidemia)   . OSA (obstructive sleep apnea)   . Hypothyroidism   . Diastolic CHF, chronic 11/11/2012    Class 2b-3 2 d echo 5/12 mild LVH EF 55-50%, MILD AI, MILD MR, SEVERE LAE, mild RAE, PASP 31, SMALL PERICRADIAL EFFUSION  . Hx of cervical cancer 11/11/2012  . S/P hysterectomy 11/11/2012  . DM2 (diabetes mellitus, type 2)   . Colon cancer     "polyp" (12/08/2012)  . Breast cancer   . Cervical cancer   . Ovarian cancer   . Bladder cancer   . H/O ovarian cancer 11/11/2012     MUCINOUS CYST S/P RESECTION 35 YEARS AGO  . Hx of bladder cancer 11/11/2012   Past Surgical History  Procedure Laterality Date  . Bladder surgery    . Appendectomy    . Vaginal hysterectomy    . Exploratory laparotomy with abdominal mass excision      "21# ovarian tumor; benign" (12/08/2012)  . Cataract extraction w/ intraocular lens  implant, bilateral    . Mastectomy, radical Left   . Breast lumpectomy Right   . Breast biopsy Bilateral   . I&d extremity Right 12/31/2012     Procedure: IRRIGATION AND DEBRIDEMENT RIGHT KNEE ULCER WITH PLACEMENT OF A CELL AND VAC ;  Surgeon: Wayland Denis, DO;  Location: [facility];  Service: Plastics;  Laterality: Right;    Current Outpatient Prescriptions  Medication Sig Dispense Refill  . acetaminophen (TYLENOL) 650 MG CR tablet Take 650 mg by mouth every 4 (four) hours as needed for pain.      . bisacodyl (DULCOLAX) 5 MG EC tablet Take 5 mg by mouth 2 (two) times daily.      Marland Kitchen diltiazem (CARDIZEM CD) 360 MG 24 hr capsule Take 360 mg by mouth every morning.       . ferrous sulfate 325 (65 FE) MG tablet Take 325 mg by mouth daily with breakfast.       . furosemide (LASIX) 40 MG tablet Take 40 mg by mouth 2 (two) times daily.      Marland Kitchen levothyroxine (SYNTHROID, LEVOTHROID) 112 MCG tablet Take 224 mcg by mouth daily before breakfast.      . potassium chloride SA (K-DUR,KLOR-CON) 20 MEQ tablet Take 20 mEq by mouth daily.      Marland Kitchen apixaban (ELIQUIS) 5 MG TABS tablet Take 1 tablet (5 mg total) by mouth 2 (two) times daily.  60 tablet  11  . ramipril (ALTACE) 10 MG capsule Take 1 capsule (  10 mg total) by mouth daily.  90 capsule  3   No current facility-administered medications for this visit.    Allergies  Allergen Reactions  . Codeine     unknown  . Morphine And Related     sick  . Statins     sick    History   Social History  . Marital Status: Widowed    Spouse Name: N/A    Number of Children: N/A  . Years of Education: N/A   Occupational History  . Not on file.   Social History Main Topics  . Smoking status: Former Smoker -- 3.00 packs/day for 25 years    Types: Cigarettes    Quit date: 07/02/1966  . Smokeless tobacco: Never Used  . Alcohol Use: No  . Drug Use: No  . Sexual Activity: No   Other Topics Concern  . Not on file   Social History Narrative  . No narrative on file     Physical Exam: Filed Vitals:   03/23/13 1400  BP: 131/75  Pulse: 88  Height: 5\' 1"  (1.549 m)  Weight: 207 lb (93.895 kg)     GEN- The patient is elderly appearing, alert and oriented x 3 today.   Head- normocephalic, atraumatic Eyes-  Sclera clear, conjunctiva pink Ears- hearing intact Oropharynx- clear Neck- supple, no JVP Lymph- no cervical lymphadenopathy Lungs- Clear to ausculation bilaterally, normal work of breathing Heart- irregular rate and rhythm, no murmurs, rubs or gallops, PMI not laterally displaced GI- soft, NT, ND, + BS Extremities- no clubbing, cyanosis, 1+ BLE edema, knee dressing is clean and dry Neuro- strength and sensation are intact  Assessment and Plan:  1. Permanent afib Doing well No active bleeding. Today, I discussed coumadin novel anticoagulants including pradaxa, xarelto, and eliquis today as indicated for risk reduction in stroke and systemic emboli with nonvalvular atrial fibrillation.  Risks, benefits, and alternatives to each of these drugs were discussed at length today.  I am concerned about her risks of stroke off of anticoagulation (Her CHADS2VASC score is at least 5).  I think that perhaps the safest of these medicines may be eliquis.  She will start eliquis 5mg  BID at this time. She will return to the anticoagulation clinic in 4 weeks.  BMET and CBC today  2. Chronic diastolic dysfunction Stable No changes bmet today  3. HTN Stable No change required today  Return in 4 months

## 2013-03-24 DIAGNOSIS — I4891 Unspecified atrial fibrillation: Secondary | ICD-10-CM | POA: Diagnosis not present

## 2013-03-24 DIAGNOSIS — S8000XA Contusion of unspecified knee, initial encounter: Secondary | ICD-10-CM | POA: Diagnosis not present

## 2013-03-24 DIAGNOSIS — I5032 Chronic diastolic (congestive) heart failure: Secondary | ICD-10-CM | POA: Diagnosis not present

## 2013-03-24 DIAGNOSIS — I509 Heart failure, unspecified: Secondary | ICD-10-CM | POA: Diagnosis not present

## 2013-03-24 DIAGNOSIS — Z7901 Long term (current) use of anticoagulants: Secondary | ICD-10-CM | POA: Diagnosis not present

## 2013-03-24 DIAGNOSIS — E119 Type 2 diabetes mellitus without complications: Secondary | ICD-10-CM | POA: Diagnosis not present

## 2013-04-03 DIAGNOSIS — L97809 Non-pressure chronic ulcer of other part of unspecified lower leg with unspecified severity: Secondary | ICD-10-CM | POA: Diagnosis not present

## 2013-04-23 ENCOUNTER — Ambulatory Visit (INDEPENDENT_AMBULATORY_CARE_PROVIDER_SITE_OTHER): Payer: Medicare Other | Admitting: *Deleted

## 2013-04-23 DIAGNOSIS — I4891 Unspecified atrial fibrillation: Secondary | ICD-10-CM | POA: Diagnosis not present

## 2013-04-23 LAB — CBC
MCHC: 32.7 g/dL (ref 30.0–36.0)
MCV: 93.9 fl (ref 78.0–100.0)
Platelets: 199 10*3/uL (ref 150.0–400.0)
RDW: 18.4 % — ABNORMAL HIGH (ref 11.5–14.6)

## 2013-04-23 LAB — BASIC METABOLIC PANEL
BUN: 16 mg/dL (ref 6–23)
Creatinine, Ser: 0.8 mg/dL (ref 0.4–1.2)
GFR: 68.42 mL/min (ref >60.00)
Potassium: 4.6 mEq/L (ref 3.5–5.1)

## 2013-04-23 NOTE — Progress Notes (Signed)
Pt was started on Eliquis 5mg  bid  for Atrial Fib on 03/15/2013 .    Reviewed patients medication list.  Pt is not currently on any combined P-gp and strong CYP3A4 inhibitors/inducers (ketoconazole, traconazole, ritonavir, carbamazepine, phenytoin, rifampin, St. John's wort).  Reviewed labs.  SCr 0.8, Weight 90.636kg.  Dose is appropriate based on lab findings.   Hgb 14.0 and HCT 43.0  A full discussion of the nature of anticoagulants has been carried out.  A benefit/risk analysis has been presented to the patient, so that they understand the justification for choosing anticoagulation with Eliquis at this time.  The need for compliance is stressed.  Pt is aware to take the medication twice daily.  Side effects of potential bleeding are discussed, including unusual colored urine or stools, coughing up blood or coffee ground emesis, nose bleeds or serious fall or head trauma.  Discussed signs and symptoms of stroke. The patient should avoid any OTC items containing aspirin or ibuprofen.  Avoid alcohol consumption.   Call if any signs of abnormal bleeding.  Discussed financial obligations and pt states she is able to afford Eliquis .  Next lab test test in 6 months. CBC and BMET done today and pt instructed will call in am with results of labs.Pt has been tolerating Eliquis without problems 04/24/2013  Talked with pt instructed that her labs were within normal limits and she is on correct dose of Eliquis and appt made for return visit in 6 months and she states understanding.

## 2013-04-24 MED ORDER — APIXABAN 5 MG PO TABS: 5.0000 mg | ORAL_TABLET | Freq: Two times a day (BID) | ORAL | Status: DC

## 2013-04-27 ENCOUNTER — Encounter: Payer: Self-pay | Admitting: Podiatry

## 2013-04-27 ENCOUNTER — Ambulatory Visit (INDEPENDENT_AMBULATORY_CARE_PROVIDER_SITE_OTHER): Payer: Medicare Other | Admitting: Podiatry

## 2013-04-27 VITALS — BP 130/91 | HR 76 | Resp 20 | Ht 61.0 in | Wt 199.0 lb

## 2013-04-27 DIAGNOSIS — M79609 Pain in unspecified limb: Secondary | ICD-10-CM | POA: Diagnosis not present

## 2013-04-27 DIAGNOSIS — B351 Tinea unguium: Secondary | ICD-10-CM

## 2013-04-27 NOTE — Progress Notes (Signed)
Patient ID: [name], female   DOB: 08/03/1919, 79 y.o.   MRN: [id number]   Subjective: This patient presents for ongoing debridement of painful mycotic toenails. Since her visit of May of 2014 she is having fall resulting in trauma to right leg for which she was placed in a nursing home and has recovered.  Objective: Alert orientated x16  79 year old white female. All 10 toenails are elongated dystrophic with texture and color changes and palpable tenderness in all nail plates.  Assessment: Symptomatic onychomycosis x10  Plan: Alternate toenails debrided without any bleeding. Reappoint at three-month intervals.  Avyukth Bontempo C.Leeanne Deed, DPM

## 2013-05-08 ENCOUNTER — Telehealth: Payer: Self-pay | Admitting: Internal Medicine

## 2013-05-08 NOTE — Telephone Encounter (Signed)
Needs a follow up in January with Dr Johney Frame , will have Melissa call her to schedule the appointment

## 2013-05-08 NOTE — Telephone Encounter (Signed)
New Problem:  Pt states she came into the clinic in September and she just got a letter stating she is due for her annual check in January. Could you clarify when the pt needs a appt? Also, pt states she has questions about her ellaquis... Please advise

## 2013-06-11 DIAGNOSIS — E039 Hypothyroidism, unspecified: Secondary | ICD-10-CM | POA: Diagnosis not present

## 2013-06-11 DIAGNOSIS — Z23 Encounter for immunization: Secondary | ICD-10-CM | POA: Diagnosis not present

## 2013-06-11 DIAGNOSIS — E118 Type 2 diabetes mellitus with unspecified complications: Secondary | ICD-10-CM | POA: Diagnosis not present

## 2013-06-11 DIAGNOSIS — I10 Essential (primary) hypertension: Secondary | ICD-10-CM | POA: Diagnosis not present

## 2013-06-11 DIAGNOSIS — E876 Hypokalemia: Secondary | ICD-10-CM | POA: Diagnosis not present

## 2013-06-11 DIAGNOSIS — E785 Hyperlipidemia, unspecified: Secondary | ICD-10-CM | POA: Diagnosis not present

## 2013-06-11 DIAGNOSIS — I4891 Unspecified atrial fibrillation: Secondary | ICD-10-CM | POA: Diagnosis not present

## 2013-07-16 ENCOUNTER — Ambulatory Visit: Payer: Medicare Other | Admitting: Internal Medicine

## 2013-07-27 ENCOUNTER — Ambulatory Visit (INDEPENDENT_AMBULATORY_CARE_PROVIDER_SITE_OTHER): Payer: Medicare Other | Admitting: Internal Medicine

## 2013-07-27 ENCOUNTER — Encounter: Payer: Self-pay | Admitting: Internal Medicine

## 2013-07-27 VITALS — BP 130/86 | HR 86 | Ht 61.0 in | Wt 204.0 lb

## 2013-07-27 DIAGNOSIS — I872 Venous insufficiency (chronic) (peripheral): Secondary | ICD-10-CM

## 2013-07-27 DIAGNOSIS — I10 Essential (primary) hypertension: Secondary | ICD-10-CM

## 2013-07-27 DIAGNOSIS — I509 Heart failure, unspecified: Secondary | ICD-10-CM

## 2013-07-27 DIAGNOSIS — I4891 Unspecified atrial fibrillation: Secondary | ICD-10-CM

## 2013-07-27 DIAGNOSIS — I5032 Chronic diastolic (congestive) heart failure: Secondary | ICD-10-CM

## 2013-07-27 NOTE — Progress Notes (Signed)
PCP: [name], MD  The patient presents today for routine cardiology followup.  She is doing quite well at this time.    Today, she denies symptoms of palpitations, chest pain, shortness of breath, orthopnea, PND, dizziness, presyncope, syncope, or neurologic sequela.   She remains active despite her age. The patient feels that she is tolerating medications without difficulties and is otherwise without complaint today.   Past Medical History  Diagnosis Date  . Hypertension   . SBO (small bowel obstruction)   . Permanent atrial fibrillation     a. coumadin d/c'd => Pradaxa in 05/2012  . Chronic diastolic heart failure     a. Echo 5/12: Mild LVH, EF 55-60%, mild AI, mild MR, severe LAE, mild RAE, PASP 31, small pericardial effusion  . Venous insufficiency     chronic LE edema  . HLD (hyperlipidemia)   . OSA (obstructive sleep apnea)   . Hypothyroidism   . Diastolic CHF, chronic 4/74/2595    Class 2b-3 2 d echo 5/12 mild LVH EF 55-50%, MILD AI, MILD MR, SEVERE LAE, mild RAE, PASP 31, SMALL PERICRADIAL EFFUSION  . Hx of cervical cancer 11/11/2012  . S/P hysterectomy 11/11/2012  . DM2 (diabetes mellitus, type 2)   . Colon cancer     "polyp" (12/08/2012)  . Breast cancer   . Cervical cancer   . Ovarian cancer   . Bladder cancer   . H/O ovarian cancer 11/11/2012     MUCINOUS CYST S/P RESECTION 35 YEARS AGO  . Hx of bladder cancer 11/11/2012   Past Surgical History  Procedure Laterality Date  . Bladder surgery    . Appendectomy    . Vaginal hysterectomy    . Exploratory laparotomy with abdominal mass excision      "21# ovarian tumor; benign" (12/08/2012)  . Cataract extraction w/ intraocular lens  implant, bilateral    . Mastectomy, radical Left   . Breast lumpectomy Right   . Breast biopsy Bilateral   . I&d extremity Right 12/31/2012    Procedure: IRRIGATION AND DEBRIDEMENT RIGHT KNEE ULCER WITH PLACEMENT OF A CELL AND VAC ;  Surgeon: Theodoro Kos, DO;  Location: [facility];   Service: Plastics;  Laterality: Right;    Current Outpatient Prescriptions  Medication Sig Dispense Refill  . apixaban (ELIQUIS) 5 MG TABS tablet Take 1 tablet (5 mg total) by mouth 2 (two) times daily.  180 tablet  3  . bisacodyl (DULCOLAX) 10 MG suppository Place 10 mg rectally as needed for constipation.      Marland Kitchen diltiazem (CARDIZEM CD) 360 MG 24 hr capsule Take 360 mg by mouth every morning.       . ferrous sulfate 324 (65 FE) MG TBEC Take 2 tablets daily      . furosemide (LASIX) 40 MG tablet Take 2 tabs daily      . levothyroxine (SYNTHROID, LEVOTHROID) 200 MCG tablet Take 200 mcg by mouth daily before breakfast.      . NON FORMULARY Potchlor SR 10 meq capsules. Take one capsule once a day      . ramipril (ALTACE) 10 MG capsule Take 1 capsule (10 mg total) by mouth daily.  90 capsule  3   No current facility-administered medications for this visit.    Allergies  Allergen Reactions  . Codeine     unknown  . Morphine And Related     sick  . Statins     sick    History   Social History  .  Marital Status: Widowed    Spouse Name: N/A    Number of Children: N/A  . Years of Education: N/A   Occupational History  . Not on file.   Social History Main Topics  . Smoking status: Former Smoker -- 3.00 packs/day for 25 years    Types: Cigarettes    Quit date: 07/02/1966  . Smokeless tobacco: Never Used  . Alcohol Use: No  . Drug Use: No  . Sexual Activity: No   Other Topics Concern  . Not on file   Social History Narrative  . No narrative on file     Physical Exam: Filed Vitals:   07/27/13 1055  BP: 130/86  Pulse: 86  Height: 5\' 1"  (1.549 m)  Weight: 204 lb (92.534 kg)    GEN- The patient is elderly appearing, alert and oriented x 3 today.   Head- normocephalic, atraumatic Eyes-  Sclera clear, conjunctiva pink Ears- hearing intact Oropharynx- clear Neck- supple, no JVP Lymph- no cervical lymphadenopathy Lungs- Clear to ausculation bilaterally, normal work  of breathing Heart- irregular rate and rhythm, no murmurs, rubs or gallops, PMI not laterally displaced GI- soft, NT, ND, + BS Extremities- no clubbing, cyanosis, 1+ BLE edema, knee has healed nicely Neuro- strength and sensation are intact  Assessment and Plan:  1. Permanent afib Doing well No active bleeding. Continue eliquis and follow-up in the anticoagulation clinic  2. Chronic diastolic dysfunction Stable No changes  3. HTN Stable No change required today  Return in 6 months

## 2013-07-27 NOTE — Patient Instructions (Signed)
Your physician wants you to follow-up in: 6 months with Dr. Allred. You will receive a reminder letter in the mail two months in advance. If you don't receive a letter, please call our office to schedule the follow-up appointment.  

## 2013-09-14 ENCOUNTER — Ambulatory Visit: Payer: TRICARE For Life (TFL) | Admitting: Podiatry

## 2013-09-21 ENCOUNTER — Ambulatory Visit: Payer: TRICARE For Life (TFL) | Admitting: Podiatry

## 2013-10-05 ENCOUNTER — Encounter: Payer: Self-pay | Admitting: Podiatry

## 2013-10-05 ENCOUNTER — Ambulatory Visit (INDEPENDENT_AMBULATORY_CARE_PROVIDER_SITE_OTHER): Payer: Medicare Other | Admitting: Podiatry

## 2013-10-05 VITALS — BP 131/69 | HR 84 | Resp 18

## 2013-10-05 DIAGNOSIS — B351 Tinea unguium: Secondary | ICD-10-CM | POA: Diagnosis not present

## 2013-10-05 DIAGNOSIS — M79609 Pain in unspecified limb: Secondary | ICD-10-CM

## 2013-10-06 NOTE — Progress Notes (Signed)
Patient ID: [name], female   DOB: 11-15-1919, 80 y.o.   MRN: [id number]  Subjective: This patient presents for ongoing debridement of painful mycotic toenails  Objective: Orientated x3 white female  Elongated, hypertrophic, incurvated, toenails x10  Assessment: Symptomatic onychomycoses x10  Plan: Nails x10 are debrided without a bleeding. Reappoint at three-month intervals.

## 2013-11-13 ENCOUNTER — Ambulatory Visit (INDEPENDENT_AMBULATORY_CARE_PROVIDER_SITE_OTHER): Payer: Medicare Other | Admitting: *Deleted

## 2013-11-13 DIAGNOSIS — I4891 Unspecified atrial fibrillation: Secondary | ICD-10-CM

## 2013-11-13 LAB — BASIC METABOLIC PANEL
BUN: 15 mg/dL (ref 6–23)
CALCIUM: 9 mg/dL (ref 8.4–10.5)
CO2: 27 mEq/L (ref 19–32)
CREATININE: 0.8 mg/dL (ref 0.4–1.2)
Chloride: 104 mEq/L (ref 96–112)
GFR: 75.65 mL/min (ref >60.00)
GLUCOSE: 84 mg/dL (ref 70–99)
POTASSIUM: 3.4 meq/L — AB (ref 3.5–5.1)
Sodium: 142 mEq/L (ref 135–145)

## 2013-11-13 LAB — CBC
HEMATOCRIT: 42.2 % (ref 36.0–46.0)
Hemoglobin: 14.2 g/dL (ref 12.0–15.0)
MCHC: 33.6 g/dL (ref 30.0–36.0)
MCV: 94.5 fl (ref 78.0–100.0)
PLATELETS: 174 10*3/uL (ref 150.0–400.0)
RBC: 4.47 Mil/uL (ref 3.87–5.11)
RDW: 14.6 % (ref 11.5–15.5)
WBC: 5.6 10*3/uL (ref 4.0–10.5)

## 2013-11-13 NOTE — Progress Notes (Signed)
Pt was started on Eliquis  5mg  bid for Atrial Fib  on 03/15/2013.    Reviewed patients medication list.  Pt is not  currently on any combined P-gp and strong CYP3A4 inhibitors/inducers (ketoconazole, traconazole, ritonavir, carbamazepine, phenytoin, rifampin, St. John's wort).  Reviewed labs.  SCr 0.8 , Weight  94.45.kg.  Dose is appropriate  based on labs.   Hgb 14.2 and HCT 42.2  A full discussion of the nature of anticoagulants has been carried out.  A benefit/risk analysis has been presented to the patient, so that they understand the justification for choosing anticoagulation with Eliquis at this time.  The need for compliance is stressed.  Pt is aware to take the medication twice daily.  Side effects of potential bleeding are discussed, including unusual colored urine or stools, coughing up blood or coffee ground emesis, nose bleeds or serious fall or head trauma.  Discussed signs and symptoms of stroke. The patient should avoid any OTC items containing aspirin or ibuprofen.  Avoid alcohol consumption.   Call if any signs of abnormal bleeding.  Discussed financial obligations and pt states she is not having any problems in obtaining Eliquis .  Next lab test test in 6 months.  Pt states she has not had any signs or symptoms of bleeding nor any sign or symptom of stroke and has had no problems in taking Eliquis 11/16/2013  Talked with pt and instructed that she is on correct dose of Eliquis 5mg  bid and made appt to be rechecked in 6 months and she states understanding.

## 2013-11-19 DIAGNOSIS — Z961 Presence of intraocular lens: Secondary | ICD-10-CM | POA: Diagnosis not present

## 2013-11-19 DIAGNOSIS — H26499 Other secondary cataract, unspecified eye: Secondary | ICD-10-CM | POA: Diagnosis not present

## 2013-11-19 DIAGNOSIS — E119 Type 2 diabetes mellitus without complications: Secondary | ICD-10-CM | POA: Diagnosis not present

## 2013-11-30 ENCOUNTER — Encounter: Payer: Self-pay | Admitting: Internal Medicine

## 2013-12-16 DIAGNOSIS — H26499 Other secondary cataract, unspecified eye: Secondary | ICD-10-CM | POA: Diagnosis not present

## 2013-12-28 ENCOUNTER — Encounter (HOSPITAL_COMMUNITY): Payer: Self-pay | Admitting: Emergency Medicine

## 2013-12-28 ENCOUNTER — Emergency Department (HOSPITAL_COMMUNITY): Payer: Medicare Other

## 2013-12-28 ENCOUNTER — Inpatient Hospital Stay (HOSPITAL_COMMUNITY)
Admission: EM | Admit: 2013-12-28 | Discharge: 2014-01-03 | DRG: 389 | Disposition: A | Discharge status: Home / Self Care | Payer: Medicare Other | Attending: Internal Medicine | Admitting: Internal Medicine

## 2013-12-28 DIAGNOSIS — E860 Dehydration: Secondary | ICD-10-CM | POA: Diagnosis present

## 2013-12-28 DIAGNOSIS — Z885 Allergy status to narcotic agent status: Secondary | ICD-10-CM

## 2013-12-28 DIAGNOSIS — Z853 Personal history of malignant neoplasm of breast: Secondary | ICD-10-CM | POA: Diagnosis not present

## 2013-12-28 DIAGNOSIS — Z7901 Long term (current) use of anticoagulants: Secondary | ICD-10-CM | POA: Diagnosis not present

## 2013-12-28 DIAGNOSIS — Z8541 Personal history of malignant neoplasm of cervix uteri: Secondary | ICD-10-CM | POA: Diagnosis not present

## 2013-12-28 DIAGNOSIS — I4891 Unspecified atrial fibrillation: Secondary | ICD-10-CM

## 2013-12-28 DIAGNOSIS — E1159 Type 2 diabetes mellitus with other circulatory complications: Secondary | ICD-10-CM | POA: Diagnosis not present

## 2013-12-28 DIAGNOSIS — K56609 Unspecified intestinal obstruction, unspecified as to partial versus complete obstruction: Secondary | ICD-10-CM | POA: Diagnosis not present

## 2013-12-28 DIAGNOSIS — Z9010 Acquired absence of unspecified breast and nipple: Secondary | ICD-10-CM | POA: Diagnosis not present

## 2013-12-28 DIAGNOSIS — E785 Hyperlipidemia, unspecified: Secondary | ICD-10-CM | POA: Diagnosis present

## 2013-12-28 DIAGNOSIS — R1110 Vomiting, unspecified: Secondary | ICD-10-CM

## 2013-12-28 DIAGNOSIS — Z66 Do not resuscitate: Secondary | ICD-10-CM | POA: Diagnosis present

## 2013-12-28 DIAGNOSIS — I5032 Chronic diastolic (congestive) heart failure: Secondary | ICD-10-CM | POA: Diagnosis not present

## 2013-12-28 DIAGNOSIS — C189 Malignant neoplasm of colon, unspecified: Secondary | ICD-10-CM

## 2013-12-28 DIAGNOSIS — Z8551 Personal history of malignant neoplasm of bladder: Secondary | ICD-10-CM

## 2013-12-28 DIAGNOSIS — Z888 Allergy status to other drugs, medicaments and biological substances status: Secondary | ICD-10-CM | POA: Diagnosis not present

## 2013-12-28 DIAGNOSIS — R109 Unspecified abdominal pain: Secondary | ICD-10-CM | POA: Diagnosis not present

## 2013-12-28 DIAGNOSIS — R112 Nausea with vomiting, unspecified: Secondary | ICD-10-CM | POA: Diagnosis not present

## 2013-12-28 DIAGNOSIS — I4821 Permanent atrial fibrillation: Secondary | ICD-10-CM | POA: Diagnosis present

## 2013-12-28 DIAGNOSIS — Z8543 Personal history of malignant neoplasm of ovary: Secondary | ICD-10-CM | POA: Diagnosis not present

## 2013-12-28 DIAGNOSIS — D500 Iron deficiency anemia secondary to blood loss (chronic): Secondary | ICD-10-CM | POA: Diagnosis not present

## 2013-12-28 DIAGNOSIS — R040 Epistaxis: Secondary | ICD-10-CM | POA: Diagnosis not present

## 2013-12-28 DIAGNOSIS — K2990 Gastroduodenitis, unspecified, without bleeding: Secondary | ICD-10-CM | POA: Diagnosis not present

## 2013-12-28 DIAGNOSIS — Z87891 Personal history of nicotine dependence: Secondary | ICD-10-CM

## 2013-12-28 DIAGNOSIS — I10 Essential (primary) hypertension: Secondary | ICD-10-CM

## 2013-12-28 DIAGNOSIS — G4733 Obstructive sleep apnea (adult) (pediatric): Secondary | ICD-10-CM | POA: Diagnosis present

## 2013-12-28 DIAGNOSIS — E119 Type 2 diabetes mellitus without complications: Secondary | ICD-10-CM | POA: Diagnosis present

## 2013-12-28 DIAGNOSIS — E039 Hypothyroidism, unspecified: Secondary | ICD-10-CM

## 2013-12-28 DIAGNOSIS — I5043 Acute on chronic combined systolic (congestive) and diastolic (congestive) heart failure: Secondary | ICD-10-CM | POA: Diagnosis present

## 2013-12-28 DIAGNOSIS — R141 Gas pain: Secondary | ICD-10-CM | POA: Diagnosis not present

## 2013-12-28 DIAGNOSIS — E876 Hypokalemia: Secondary | ICD-10-CM | POA: Diagnosis present

## 2013-12-28 DIAGNOSIS — I509 Heart failure, unspecified: Secondary | ICD-10-CM | POA: Diagnosis present

## 2013-12-28 DIAGNOSIS — D62 Acute posthemorrhagic anemia: Secondary | ICD-10-CM | POA: Diagnosis present

## 2013-12-28 DIAGNOSIS — K2970 Gastritis, unspecified, without bleeding: Secondary | ICD-10-CM | POA: Diagnosis not present

## 2013-12-28 DIAGNOSIS — I369 Nonrheumatic tricuspid valve disorder, unspecified: Secondary | ICD-10-CM | POA: Diagnosis not present

## 2013-12-28 DIAGNOSIS — N179 Acute kidney failure, unspecified: Secondary | ICD-10-CM | POA: Diagnosis not present

## 2013-12-28 LAB — CBC WITH DIFFERENTIAL/PLATELET
BASOS ABS: 0 10*3/uL (ref 0.0–0.1)
BASOS PCT: 0 % (ref 0–1)
Eosinophils Absolute: 0 10*3/uL (ref 0.0–0.7)
Eosinophils Relative: 0 % (ref 0–5)
HEMATOCRIT: 48 % — AB (ref 36.0–46.0)
Hemoglobin: 16 g/dL — ABNORMAL HIGH (ref 12.0–15.0)
LYMPHS PCT: 14 % (ref 12–46)
Lymphs Abs: 1 10*3/uL (ref 0.7–4.0)
MCH: 32.4 pg (ref 26.0–34.0)
MCHC: 33.3 g/dL (ref 30.0–36.0)
MCV: 97.2 fL (ref 78.0–100.0)
MONO ABS: 0.4 10*3/uL (ref 0.1–1.0)
Monocytes Relative: 6 % (ref 3–12)
NEUTROS ABS: 5.9 10*3/uL (ref 1.7–7.7)
NEUTROS PCT: 80 % — AB (ref 43–77)
PLATELETS: 203 10*3/uL (ref 150–400)
RBC: 4.94 MIL/uL (ref 3.87–5.11)
RDW: 16 % — AB (ref 11.5–15.5)
WBC: 7.3 10*3/uL (ref 4.0–10.5)

## 2013-12-28 LAB — COMPREHENSIVE METABOLIC PANEL
ALBUMIN: 4.4 g/dL (ref 3.5–5.2)
ALT: 22 U/L (ref 0–35)
AST: 32 U/L (ref 0–37)
Alkaline Phosphatase: 90 U/L (ref 39–117)
BILIRUBIN TOTAL: 0.7 mg/dL (ref 0.3–1.2)
BUN: 24 mg/dL — ABNORMAL HIGH (ref 6–23)
CHLORIDE: 100 meq/L (ref 96–112)
CO2: 26 meq/L (ref 19–32)
CREATININE: 1.1 mg/dL (ref 0.50–1.10)
Calcium: 10 mg/dL (ref 8.4–10.5)
GFR calc Af Amer: 49 mL/min — ABNORMAL LOW (ref >90)
GFR, EST NON AFRICAN AMERICAN: 42 mL/min — AB (ref >90)
Glucose, Bld: 127 mg/dL — ABNORMAL HIGH (ref 70–99)
POTASSIUM: 4.5 meq/L (ref 3.7–5.3)
SODIUM: 144 meq/L (ref 137–147)
Total Protein: 8.2 g/dL (ref 6.0–8.3)

## 2013-12-28 LAB — TROPONIN I: Troponin I: <0.3 ng/mL (ref <0.30)

## 2013-12-28 LAB — I-STAT TROPONIN, ED: Troponin i, poc: 0.02 ng/mL (ref 0.00–0.08)

## 2013-12-28 LAB — LIPASE, BLOOD: Lipase: 35 U/L (ref 11–59)

## 2013-12-28 MED ORDER — PHENYLEPHRINE HCL 0.25 % NA SOLN
2.0000 | Freq: Two times a day (BID) | NASAL | Status: DC | PRN
  Administered 2013-12-29 – 2014-01-03 (×3): 2 via NASAL
  Filled 2013-12-28 (×2): qty 15

## 2013-12-28 MED ORDER — METOPROLOL TARTRATE 1 MG/ML IV SOLN
5.0000 mg | Freq: Four times a day (QID) | INTRAVENOUS | Status: DC | PRN
  Administered 2013-12-29: 5 mg via INTRAVENOUS
  Filled 2013-12-28: qty 5

## 2013-12-28 MED ORDER — BIOTENE DRY MOUTH MT LIQD
15.0000 mL | Freq: Two times a day (BID) | OROMUCOSAL | Status: DC
  Administered 2013-12-28 – 2014-01-03 (×12): 15 mL via OROMUCOSAL

## 2013-12-28 MED ORDER — SODIUM CHLORIDE 0.9 % IV SOLN
INTRAVENOUS | Status: DC
Start: 2013-12-28 — End: 2013-12-29

## 2013-12-28 MED ORDER — BISACODYL 10 MG RE SUPP: 10.0000 mg | Freq: Every day | RECTAL | Status: DC | PRN

## 2013-12-28 MED ORDER — ONDANSETRON HCL 4 MG/2ML IJ SOLN
4.0000 mg | Freq: Four times a day (QID) | INTRAMUSCULAR | Status: DC | PRN
  Administered 2013-12-28 – 2014-01-01 (×3): 4 mg via INTRAVENOUS
  Filled 2013-12-28 (×4): qty 2

## 2013-12-28 MED ORDER — SODIUM CHLORIDE 0.9 % IV SOLN
INTRAVENOUS | Status: DC
  Administered 2013-12-28 – 2013-12-30 (×3): via INTRAVENOUS

## 2013-12-28 MED ORDER — ONDANSETRON HCL 4 MG PO TABS
4.0000 mg | ORAL_TABLET | Freq: Four times a day (QID) | ORAL | Status: DC | PRN
  Administered 2013-12-30 – 2014-01-01 (×2): 4 mg via ORAL
  Filled 2013-12-28 (×2): qty 1

## 2013-12-28 MED ORDER — ONDANSETRON HCL 4 MG/2ML IJ SOLN
4.0000 mg | Freq: Once | INTRAMUSCULAR | Status: AC
  Administered 2013-12-28: 4 mg via INTRAVENOUS
  Filled 2013-12-28: qty 2

## 2013-12-28 NOTE — ED Provider Notes (Signed)
CSN: [id number]     Arrival date & time 12/28/13  1118 History   First MD Initiated Contact with Patient 12/28/13 1140     Chief Complaint  Patient presents with  . Abdominal Pain     HPI Pt was seen at 1220.  Per pt and her family, c/o gradual onset and worsening of persistent multiple intermittent episodes of N/V that began 2 to 3 days ago. Has been associated with generalized abd "pain." Describes her symptoms as "like when I get a bowel obstruction." Pt has been unable to tolerate PO food or fluids due to her symptoms. Denies diarrhea, no CP/SOB, no back pain, no fevers, no black or blood in stools or emesis.     Past Medical History  Diagnosis Date  . Hypertension   . SBO (small bowel obstruction)   . Permanent atrial fibrillation     a. coumadin d/c'd => Pradaxa in 05/2012  . Chronic diastolic heart failure     a. Echo 5/12: Mild LVH, EF 55-60%, mild AI, mild MR, severe LAE, mild RAE, PASP 31, small pericardial effusion  . Venous insufficiency     chronic LE edema  . HLD (hyperlipidemia)   . OSA (obstructive sleep apnea)   . Hypothyroidism   . Diastolic CHF, chronic 4/48/1856    Class 2b-3 2 d echo 5/12 mild LVH EF 55-50%, MILD AI, MILD MR, SEVERE LAE, mild RAE, PASP 31, SMALL PERICRADIAL EFFUSION  . Hx of cervical cancer 11/11/2012  . S/P hysterectomy 11/11/2012  . DM2 (diabetes mellitus, type 2)   . Colon cancer     "polyp" (12/08/2012)  . Breast cancer   . Cervical cancer   . Ovarian cancer   . Bladder cancer   . H/O ovarian cancer 11/11/2012     MUCINOUS CYST S/P RESECTION 35 YEARS AGO  . Hx of bladder cancer 11/11/2012   Past Surgical History  Procedure Laterality Date  . Bladder surgery    . Appendectomy    . Vaginal hysterectomy    . Exploratory laparotomy with abdominal mass excision      "21# ovarian tumor; benign" (12/08/2012)  . Cataract extraction w/ intraocular lens  implant, bilateral    . Mastectomy, radical Left   . Breast lumpectomy Right   . Breast  biopsy Bilateral   . I&d extremity Right 12/31/2012    Procedure: IRRIGATION AND DEBRIDEMENT RIGHT KNEE ULCER WITH PLACEMENT OF A CELL AND VAC ;  Surgeon: Theodoro Kos, DO;  Location: [facility];  Service: Plastics;  Laterality: Right;    History  Substance Use Topics  . Smoking status: Former Smoker -- 3.00 packs/day for 25 years    Types: Cigarettes    Quit date: 07/02/1966  . Smokeless tobacco: Never Used  . Alcohol Use: No    Review of Systems ROS: Statement: All systems negative except as marked or noted in the HPI; Constitutional: Negative for fever and chills. ; ; Eyes: Negative for eye pain, redness and discharge. ; ; ENMT: Negative for ear pain, hoarseness, nasal congestion, sinus pressure and sore throat. ; ; Cardiovascular: Negative for chest pain, palpitations, diaphoresis, dyspnea and peripheral edema. ; ; Respiratory: Negative for cough, wheezing and stridor. ; ; Gastrointestinal: +N/V, abd pain. Negative for diarrhea, blood in stool, hematemesis, jaundice and rectal bleeding. . ; ; Genitourinary: Negative for dysuria, flank pain and hematuria. ; ; Musculoskeletal: Negative for back pain and neck pain. Negative for swelling and trauma.; ; Skin: Negative for pruritus, rash, abrasions,  blisters, bruising and skin lesion.; ; Neuro: Negative for headache, lightheadedness and neck stiffness. Negative for weakness, altered level of consciousness , altered mental status, extremity weakness, paresthesias, involuntary movement, seizure and syncope.      Allergies  Codeine; Morphine and related; Statins; and Zetia  Home Medications   Prior to Admission medications   Medication Sig Start Date End Date Taking? Authorizing Provider  apixaban (ELIQUIS) 5 MG TABS tablet Take 1 tablet (5 mg total) by mouth 2 (two) times daily. 04/24/13  Yes Thompson Grayer, MD  bisacodyl (DULCOLAX) 5 MG EC tablet Take 5 mg by mouth every morning.   Yes Historical Provider, MD  diltiazem (CARDIZEM CD) 360 MG 24 hr  capsule Take 360 mg by mouth every morning.  02/26/11  Yes Jolaine Artist, MD  ferrous sulfate 324 (65 FE) MG TBEC Take 2 tablets by mouth daily.    Yes Historical Provider, MD  furosemide (LASIX) 40 MG tablet Take 40 mg by mouth daily. Take 2 tabs daily   Yes Historical Provider, MD  levothyroxine (SYNTHROID, LEVOTHROID) 200 MCG tablet Take 200 mcg by mouth daily before breakfast.   Yes Historical Provider, MD  NON FORMULARY Potchlor SR 10 meq capsules. Take one capsule once a day   Yes Historical Provider, MD  ramipril (ALTACE) 10 MG capsule Take 1 capsule (10 mg total) by mouth daily. 03/23/13  Yes Thompson Grayer, MD   BP 119/71  Pulse 48  Temp(Src) 97.6 F (36.4 C) (Oral)  Resp 22  SpO2 99% Physical Exam 1225: Physical examination:  Nursing notes reviewed; Vital signs and O2 SAT reviewed;  Constitutional: Well developed, Well nourished, In no acute distress; Head:  Normocephalic, atraumatic; Eyes: EOMI, PERRL, No scleral icterus; ENMT: Mouth and pharynx normal, Mucous membranes dry; Neck: Supple, Full range of motion, No lymphadenopathy; Cardiovascular: Irregular irregular rate and rhythm, No gallop; Respiratory: Breath sounds clear & equal bilaterally, No wheezes.  Speaking full sentences with ease, Normal respiratory effort/excursion; Chest: Nontender, Movement normal; Abdomen: +mild diffuse tenderness to palp. +softly distended, tympanitic. Decreased bowel sounds; Genitourinary: No CVA tenderness; Extremities: Pulses normal, No tenderness, No edema, No calf edema or asymmetry.; Neuro: AA&Ox3, Major CN grossly intact.  Speech clear. No gross focal motor or sensory deficits in extremities.; Skin: Color normal, Warm, Dry.   ED Course  Procedures     EKG Interpretation   Date/Time:  Monday December 28 2013 11:38:55 EDT Ventricular Rate:  114 PR Interval:    QRS Duration: 99 QT Interval:  341 QTC Calculation: 470 R Axis:   54 Text Interpretation:  Atrial fibrillation Low voltage,  extremity leads  Nonspecific ST and T wave abnormality Baseline wander Confirmed by  [facility]  MD, Nunzio Cory [phone]) on 12/28/2013 1:21:43 PM      MDM  MDM Reviewed: previous chart, nursing note and vitals Reviewed previous: labs and ECG Interpretation: labs, ECG and x-ray   Results for orders placed during the hospital encounter of 12/28/13  CBC WITH DIFFERENTIAL      Result Value Ref Range   WBC 7.3  4.0 - 10.5 K/uL   RBC 4.94  3.87 - 5.11 MIL/uL   Hemoglobin 16.0 (*) 12.0 - 15.0 g/dL   HCT 48.0 (*) 36.0 - 46.0 %   MCV 97.2  78.0 - 100.0 fL   MCH 32.4  26.0 - 34.0 pg   MCHC 33.3  30.0 - 36.0 g/dL   RDW 16.0 (*) 11.5 - 15.5 %   Platelets 203  150 -  400 K/uL   Neutrophils Relative % 80 (*) 43 - 77 %   Neutro Abs 5.9  1.7 - 7.7 K/uL   Lymphocytes Relative 14  12 - 46 %   Lymphs Abs 1.0  0.7 - 4.0 K/uL   Monocytes Relative 6  3 - 12 %   Monocytes Absolute 0.4  0.1 - 1.0 K/uL   Eosinophils Relative 0  0 - 5 %   Eosinophils Absolute 0.0  0.0 - 0.7 K/uL   Basophils Relative 0  0 - 1 %   Basophils Absolute 0.0  0.0 - 0.1 K/uL  COMPREHENSIVE METABOLIC PANEL      Result Value Ref Range   Sodium 144  137 - 147 mEq/L   Potassium 4.5  3.7 - 5.3 mEq/L   Chloride 100  96 - 112 mEq/L   CO2 26  19 - 32 mEq/L   Glucose, Bld 127 (*) 70 - 99 mg/dL   BUN 24 (*) 6 - 23 mg/dL   Creatinine, Ser 1.10  0.50 - 1.10 mg/dL   Calcium 10.0  8.4 - 10.5 mg/dL   Total Protein 8.2  6.0 - 8.3 g/dL   Albumin 4.4  3.5 - 5.2 g/dL   AST 32  0 - 37 U/L   ALT 22  0 - 35 U/L   Alkaline Phosphatase 90  39 - 117 U/L   Total Bilirubin 0.7  0.3 - 1.2 mg/dL   GFR calc non Af Amer 42 (*) >90 mL/min   GFR calc Af Amer 49 (*) >90 mL/min  LIPASE, BLOOD      Result Value Ref Range   Lipase 35  11 - 59 U/L  I-STAT TROPOININ, ED      Result Value Ref Range   Troponin i, poc 0.02  0.00 - 0.08 ng/mL   Comment 3             Dg Abd Acute W/chest 12/28/2013   CLINICAL DATA:  Abdominal pain  EXAM: ACUTE ABDOMEN  SERIES (ABDOMEN 2 VIEW & CHEST 1 VIEW)  COMPARISON:  PA and lateral chest x-ray of December 31, 2012  FINDINGS: The patient has undergone previous left mastectomy. The lungs are well-expanded. There is increased density in the right infrahilar region which is more conspicuous than on previous studies but which is not entirely new. The cardiac silhouette remains enlarged. The pulmonary vascularity is not engorged. There is no pleural effusion. There are degenerative changes of both shoulders.  There is mild distention of the stomach with gas and fluid. There is a loop of mildly distended small bowel projecting over the right iliac crest. A few small bowel air-fluid levels are demonstrated elsewhere. There is a normal stool and gas pattern within the sigmoid and rectum. No free extraluminal gas collections are demonstrated. There are degenerative changes of the lumbar spine, the SI joints, and the right hip.  IMPRESSION: 1. There is an ileus or partial distal small bowel obstruction. There is no evidence of perforation. 2. Increased density in the right infrahilar region may reflect lymphadenopathy or parenchymal consolidation as might be seen with pneumonia. This merits follow-up chest CT scanning when the patient can tolerate the procedure.   Electronically Signed   By: David  Martinique   On: 12/28/2013 13:37     1355:  IV zofran given for nausea. VS remain stable. Clinically appears dehydrated, will continue judicious IVF. Dx and testing d/w pt and family.  Questions answered.  Verb understanding, agreeable to admit.  T/C to [facility] Dr. Allyson Sabal, case discussed, including:  HPI, pertinent PM/SHx, VS/PE, dx testing, ED course and treatment:  Agreeable to admit, requests she will come to the ED for evaluation.   Alfonzo Feller, DO 12/31/13 [phone]

## 2013-12-28 NOTE — Progress Notes (Signed)
Since beginning of shift, nose has been bleeding intermittently and now seems to be more freqent. Ice pack applied and still has some bleeding. Notified hospitalist and will continue to monitor patient.

## 2013-12-28 NOTE — H&P (Signed)
[facility] History and Physical  [name] NOI:[id number] DOB: Apr 04, 1920 DOA: 12/28/2013  Referring physician: Alfonzo Feller, DO  PCP: [name], MD   Chief Complaint: Abdominal Pain   HPI:  80 yo white female comes in c/o gradual onset and worsening of persistent abdominal discomfort , bloating and inability to have a BM ,with  intermittent episodes of N/V that began 2 to 3 days ago.Last BM was 11:30 am yesterday after taking dulcolax supp .  ." Describes her symptoms as "like when I get a bowel obstruction." Pt has been unable to tolerate PO food or fluids due to her symptoms. Denies diarrhea. She also describes intermittent chest pain for last 2 weeks associated with some dyspnea. She has multiple medical problems and  multiple abdominal cancers including bladder, cervical, and ovarian. She has also had breast cancer and underwent several operations for all of these malignancies. 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  Because of these multiple surgeries, she states she has had 13  prior bowel obstructions, but said they always get better without any surgeries and conservative management .       Review of Systems: negative for the following  Constitutional: Denies fever, chills, diaphoresis, appetite change and fatigue.  HEENT: Denies photophobia, eye pain, redness, hearing loss, ear pain, congestion, sore throat, rhinorrhea, sneezing, mouth sores, trouble swallowing, neck pain, neck stiffness and tinnitus.  Respiratory: Denies SOB, DOE, cough, chest tightness, and wheezing.  Cardiovascular: positive chest pain, palpitations and leg swelling.  Gastrointestinal: positive  nausea, vomiting, abdominal pain, diarrhea,positive  constipation, blood in stool and abdominal distention.  Genitourinary: Denies dysuria, urgency, frequency, hematuria, flank pain and difficulty  urinating.  Musculoskeletal: Denies myalgias, back pain, joint swelling, arthralgias and gait problem.  Skin: Denies pallor, rash and wound.  Neurological: Denies dizziness, seizures, syncope, weakness, light-headedness, numbness and headaches.  Hematological: Denies adenopathy. Easy bruising, personal or family bleeding history  Psychiatric/Behavioral: Denies suicidal ideation, mood changes, confusion, nervousness, sleep disturbance and agitation       Past Medical History  Diagnosis Date  . Hypertension   . SBO (small bowel obstruction)   . Permanent atrial fibrillation     a. coumadin d/c'd => Pradaxa in 05/2012  . Chronic diastolic heart failure     a. Echo 5/12: Mild LVH, EF 55-60%, mild AI, mild MR, severe LAE, mild RAE, PASP 31, small pericardial effusion  . Venous insufficiency     chronic LE edema  . HLD (hyperlipidemia)   . OSA (obstructive sleep apnea)   . Hypothyroidism   . Diastolic CHF, chronic 6/94/5038    Class 2b-3 2 d echo 5/12 mild LVH EF 55-50%, MILD AI, MILD MR, SEVERE LAE, mild RAE, PASP 31, SMALL PERICRADIAL EFFUSION  . Hx of cervical cancer 11/11/2012  . S/P hysterectomy 11/11/2012  . DM2 (diabetes mellitus, type 2)   . Colon cancer     "polyp" (12/08/2012)  . Breast cancer   . Cervical cancer   . Ovarian cancer   . Bladder cancer   . H/O ovarian cancer 11/11/2012     MUCINOUS CYST S/P RESECTION 35 YEARS AGO  . Hx of bladder cancer 11/11/2012     Past Surgical History  Procedure Laterality Date  . Bladder surgery    . Appendectomy    . Vaginal hysterectomy    . Exploratory laparotomy with abdominal mass excision      "21#  ovarian tumor; benign" (12/08/2012)  . Cataract extraction w/ intraocular lens  implant, bilateral    . Mastectomy, radical Left   . Breast lumpectomy Right   . Breast biopsy Bilateral   . I&d extremity Right 12/31/2012    Procedure: IRRIGATION AND DEBRIDEMENT RIGHT KNEE ULCER WITH PLACEMENT OF A CELL AND VAC ;  Surgeon: Theodoro Kos, DO;  Location: [facility];  Service: Plastics;  Laterality: Right;      Social History:  reports that she quit smoking about 47 years ago. Her smoking use included Cigarettes. She has a 75 pack-year smoking history. She has never used smokeless tobacco. She reports that she does not drink alcohol or use illicit drugs.    Allergies  Allergen Reactions  . Codeine     unknown  . Morphine And Related     sick  . Statins     sick  . Zetia [Ezetimibe] Other (See Comments)    Side effect to strong     History reviewed. No pertinent family history.   Prior to Admission medications   Medication Sig Start Date End Date Taking? Authorizing Provider  apixaban (ELIQUIS) 5 MG TABS tablet Take 1 tablet (5 mg total) by mouth 2 (two) times daily. 04/24/13  Yes Thompson Grayer, MD  bisacodyl (DULCOLAX) 5 MG EC tablet Take 5 mg by mouth every morning.   Yes Historical Provider, MD  diltiazem (CARDIZEM CD) 360 MG 24 hr capsule Take 360 mg by mouth every morning.  02/26/11  Yes Jolaine Artist, MD  ferrous sulfate 324 (65 FE) MG TBEC Take 2 tablets by mouth daily.    Yes Historical Provider, MD  furosemide (LASIX) 40 MG tablet Take 40 mg by mouth daily. Take 2 tabs daily   Yes Historical Provider, MD  levothyroxine (SYNTHROID, LEVOTHROID) 200 MCG tablet Take 200 mcg by mouth daily before breakfast.   Yes Historical Provider, MD  NON FORMULARY Potchlor SR 10 meq capsules. Take one capsule once a day   Yes Historical Provider, MD  ramipril (ALTACE) 10 MG capsule Take 1 capsule (10 mg total) by mouth daily. 03/23/13  Yes Thompson Grayer, MD     Physical Exam: Filed Vitals:   12/28/13 1130 12/28/13 1230 12/28/13 1245 12/28/13 1355  BP: 119/71 137/73 118/73 148/74  Pulse: 48 127 95 61  Temp:      TempSrc:      Resp: 22 19 17 19   SpO2: 99% 99% 94% 100%     Constitutional: Vital signs reviewed. Patient is a well-developed and well-nourished in no acute distress and cooperative with exam. Alert and  oriented x3.  Head: Normocephalic and atraumatic  Ear: TM normal bilaterally  Mouth: no erythema or exudates, MMM  Eyes: PERRL, EOMI, conjunctivae normal, No scleral icterus.  Neck: Supple, Trachea midline normal ROM, No JVD, mass, thyromegaly, or carotid bruit present.  Cardiovascular: RRR, S1 normal, S2 normal, no MRG, pulses symmetric and intact bilaterally  Pulmonary/Chest: CTAB, no wheezes, rales, or rhonchi  Abdominal:-distended,hyperactive  bowel sounds are normal, , organomegaly, or guarding present.  GU: no CVA tenderness Musculoskeletal: No joint deformities, erythema, or stiffness, ROM full and no nontender Ext: no edema and no cyanosis, pulses palpable bilaterally (DP and PT)  Hematology: no cervical, inginal, or axillary adenopathy.  Neurological: A&O x3, Strenght is normal and symmetric bilaterally, cranial nerve II-XII are grossly intact, no focal motor deficit, sensory intact to light touch bilaterally.  Skin: Warm, dry and intact. No rash, cyanosis, or clubbing.  Psychiatric: Normal mood and affect. speech and behavior is normal. Judgment and thought content normal. Cognition and memory are normal.       Labs on Admission:    Basic Metabolic Panel:  Recent Labs Lab 12/28/13 1200  NA 144  K 4.5  CL 100  CO2 26  GLUCOSE 127*  BUN 24*  CREATININE 1.10  CALCIUM 10.0   Liver Function Tests:  Recent Labs Lab 12/28/13 1200  AST 32  ALT 22  ALKPHOS 90  BILITOT 0.7  PROT 8.2  ALBUMIN 4.4    Recent Labs Lab 12/28/13 1200  LIPASE 35   No results found for this basename: AMMONIA,  in the last 168 hours CBC:  Recent Labs Lab 12/28/13 1200  WBC 7.3  NEUTROABS 5.9  HGB 16.0*  HCT 48.0*  MCV 97.2  PLT 203   Cardiac Enzymes: No results found for this basename: CKTOTAL, CKMB, CKMBINDEX, TROPONINI,  in the last 168 hours  BNP (last 3 results) No results found for this basename: PROBNP,  in the last 8760 hours    CBG: No results found for  this basename: GLUCAP,  in the last 168 hours  Radiological Exams on Admission: Dg Abd Acute W/chest  12/28/2013   CLINICAL DATA:  Abdominal pain  EXAM: ACUTE ABDOMEN SERIES (ABDOMEN 2 VIEW & CHEST 1 VIEW)  COMPARISON:  PA and lateral chest x-ray of December 31, 2012  FINDINGS: The patient has undergone previous left mastectomy. The lungs are well-expanded. There is increased density in the right infrahilar region which is more conspicuous than on previous studies but which is not entirely new. The cardiac silhouette remains enlarged. The pulmonary vascularity is not engorged. There is no pleural effusion. There are degenerative changes of both shoulders.  There is mild distention of the stomach with gas and fluid. There is a loop of mildly distended small bowel projecting over the right iliac crest. A few small bowel air-fluid levels are demonstrated elsewhere. There is a normal stool and gas pattern within the sigmoid and rectum. No free extraluminal gas collections are demonstrated. There are degenerative changes of the lumbar spine, the SI joints, and the right hip.  IMPRESSION: 1. There is an ileus or partial distal small bowel obstruction. There is no evidence of perforation. 2. Increased density in the right infrahilar region may reflect lymphadenopathy or parenchymal consolidation as might be seen with pneumonia. This merits follow-up chest CT scanning when the patient can tolerate the procedure.   Electronically Signed   By: David  Martinique   On: 12/28/2013 13:37    EKG: Independently reviewed. *pending  Assessment/Plan Active Problems:   Small bowel obstruction  Small bowel obstruction Will manage conservatively given age and comorbidities  General surgery consultation NG tube placement  Serial KUB  And dulcolax supp prn   Atrial fibrillation On anticoagulation Continue cardizem, prn metoprolol   Hx of diastolic HF  Will repeat echo in the setting of chest pain Hold lasix due to SBO and  npo status  Continue cardizem, altace Cycle cardiac enzymes  Call cardiology if abnormal enzymes , change in echo, sees Dr Rayann Heman    Hypothyroidism Continue synthroid  Diabetes mellitus type 2  Check a hemoglobin A1c. Check CBGs every 4 hours as patient is n.p.o. Sliding scale insulin.  Code Status:   dnr Family Communication: bedside Disposition Plan: admit   Time spent: 70 mins   [facility] Pager [phone]  If 7PM-7AM, please contact night-coverage [email or web address] Password [facility] 12/28/2013,  2:05 PM

## 2013-12-28 NOTE — Progress Notes (Signed)
1845  Blood dripping from R nare . No sob  No pain ,no gagging digital pressure applied. Ice pack appied to bridge of nose. Mouth care done . Bleeding ceased thereafter. No apparent distress

## 2013-12-28 NOTE — Progress Notes (Signed)
Attempted NG tube in right nare. Patient pulled NG tube out while trying to insert.  Another nurse asked to attempt NG tube insertion per orders.

## 2013-12-28 NOTE — Progress Notes (Signed)
1630 transferred in from ED via stretcher .  Ambulated into the bed with one person assist . Daughter in attendance

## 2013-12-28 NOTE — Progress Notes (Signed)
[place] sump inerted into R nare by rn .Had traumatic insertion causing nasal bleeding . Another attempt done and was successfully to R nare drained out brownish material . Kept secured and maintained at low intermittent suction .Kept head up at 30 degrees . Mouth care done comfort measures rendered

## 2013-12-28 NOTE — Consult Note (Signed)
[name] 01/31/1920  [id number].   Primary Care MD: Dr. Hulan Fess Primary Cards: Dr. Thompson Grayer Requesting MD: Dr. 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  Due to her age at the time, the patient and her family denied having any surgical repair of this.  She has a history of multiple PSBOs secondary to this rotation.  These have all resolved with conservative management.    Last night, she started developing nausea, vomiting, and abdominal pain consistent with her normal obstructive symptoms.  She denies fevers.  She presented to the [facility] where she was found to have a PSBO on her abdominal films.  She was admitted by the hospitalist and we have been asked to see her.  She states that she would NOT want an operation no matter what, even if this does not resolve.  ROS : Please see HPI, otherwise she admits to some CP and SOB over the last week or so.  The daughter states this is sometimes her norm.  Otherwise all other systems are negative.  History reviewed. No pertinent family history.  Past Medical History  Diagnosis Date  . Hypertension   . SBO (small bowel obstruction)   . Permanent atrial fibrillation     a. coumadin d/c'd => Pradaxa in 05/2012  . Chronic diastolic heart failure     a. Echo 5/12: Mild LVH, EF 55-60%, mild AI, mild MR, severe LAE, mild RAE, PASP 31, small pericardial effusion  . Venous insufficiency     chronic LE edema  . HLD (hyperlipidemia)   . OSA (obstructive sleep apnea)   . Hypothyroidism   . Diastolic CHF, chronic 01/09/6268    Class 2b-3 2 d echo 5/12 mild LVH EF 55-50%, MILD AI, MILD MR, SEVERE LAE, mild RAE, PASP 31, SMALL PERICRADIAL EFFUSION  . Hx of cervical cancer 11/11/2012  . S/P hysterectomy 11/11/2012  . DM2 (diabetes mellitus, type 2)   . Colon cancer     "polyp" (12/08/2012)  . Breast  cancer   . Cervical cancer   . Ovarian cancer   . Bladder cancer   . H/O ovarian cancer 11/11/2012     MUCINOUS CYST S/P RESECTION 35 YEARS AGO  . Hx of bladder cancer 11/11/2012    Past Surgical History  Procedure Laterality Date  . Bladder surgery    . Appendectomy    . Vaginal hysterectomy    . Exploratory laparotomy with abdominal mass excision      "21# ovarian tumor; benign" (12/08/2012)  . Cataract extraction w/ intraocular lens  implant, bilateral    . Mastectomy, radical Left   . Breast lumpectomy Right   . Breast biopsy Bilateral   . I&d extremity Right 12/31/2012    Procedure: IRRIGATION AND DEBRIDEMENT RIGHT KNEE ULCER WITH PLACEMENT OF A CELL AND VAC ;  Surgeon: Theodoro Kos, DO;  Location: [facility];  Service: Plastics;  Laterality: Right;    Social History:  reports that she quit smoking about 47 years ago. Her smoking use included Cigarettes. She has a 75 pack-year smoking history. She has never used smokeless tobacco. She reports that she does not drink alcohol or use illicit drugs.  Allergies:  Allergies  Allergen Reactions  . Codeine     unknown  . Morphine And Related     sick  . Statins  sick  . Zetia [Ezetimibe] Other (See Comments)    Side effect to strong     Medications Prior to Admission  Medication Sig Dispense Refill  . apixaban (ELIQUIS) 5 MG TABS tablet Take 1 tablet (5 mg total) by mouth 2 (two) times daily.  180 tablet  3  . bisacodyl (DULCOLAX) 5 MG EC tablet Take 5 mg by mouth every morning.      . diltiazem (CARDIZEM CD) 360 MG 24 hr capsule Take 360 mg by mouth every morning.       . ferrous sulfate 324 (65 FE) MG TBEC Take 2 tablets by mouth daily.       . furosemide (LASIX) 40 MG tablet Take 40 mg by mouth daily. Take 2 tabs daily      . levothyroxine (SYNTHROID, LEVOTHROID) 200 MCG tablet Take 200 mcg by mouth daily before breakfast.      . NON FORMULARY Potchlor SR 10 meq capsules. Take one capsule once a day      . ramipril (ALTACE)  10 MG capsule Take 1 capsule (10 mg total) by mouth daily.  90 capsule  3    Blood pressure 133/79, pulse 44, temperature 97.6 F (36.4 C), temperature source Oral, resp. rate 21, SpO2 94.00%. Physical Exam: General: pleasant, obese white female who is laying in bed in NAD HEENT: head is normocephalic, atraumatic.  Sclera are noninjected.  PERRL.  Ears and nose without any masses or lesions.  Mouth is pink and moist Heart: regular, rhythm, but tachycardic.  Normal s1,s2. No obvious murmurs, gallops, or rubs noted.  Palpable radial and pedal pulses bilaterally Lungs: CTAB, no wheezes, rhonchi, or rales noted.  Respiratory effort nonlabored Abd: soft, distended, with tympany, NT right now. Hypoactive BS, no masses or organomegaly. Small reducible umbilical hernia containing fat only MS: all 4 extremities are symmetrical with no cyanosis, clubbing, or edema. Skin: warm and dry with no masses, lesions, or rashes Psych: A&Ox3 with an appropriate affect.    Results for orders placed during the hospital encounter of 12/28/13 (from the past 48 hour(s))  CBC WITH DIFFERENTIAL     Status: Abnormal   Collection Time    12/28/13 12:00 PM      Result Value Ref Range   WBC 7.3  4.0 - 10.5 K/uL   RBC 4.94  3.87 - 5.11 MIL/uL   Hemoglobin 16.0 (*) 12.0 - 15.0 g/dL   HCT 48.0 (*) 36.0 - 46.0 %   MCV 97.2  78.0 - 100.0 fL   MCH 32.4  26.0 - 34.0 pg   MCHC 33.3  30.0 - 36.0 g/dL   RDW 16.0 (*) 11.5 - 15.5 %   Platelets 203  150 - 400 K/uL   Neutrophils Relative % 80 (*) 43 - 77 %   Neutro Abs 5.9  1.7 - 7.7 K/uL   Lymphocytes Relative 14  12 - 46 %   Lymphs Abs 1.0  0.7 - 4.0 K/uL   Monocytes Relative 6  3 - 12 %   Monocytes Absolute 0.4  0.1 - 1.0 K/uL   Eosinophils Relative 0  0 - 5 %   Eosinophils Absolute 0.0  0.0 - 0.7 K/uL   Basophils Relative 0  0 - 1 %   Basophils Absolute 0.0  0.0 - 0.1 K/uL  COMPREHENSIVE METABOLIC PANEL     Status: Abnormal   Collection Time    12/28/13 12:00 PM       Result Value Ref Range  Sodium 144  137 - 147 mEq/L   Potassium 4.5  3.7 - 5.3 mEq/L   Chloride 100  96 - 112 mEq/L   CO2 26  19 - 32 mEq/L   Glucose, Bld 127 (*) 70 - 99 mg/dL   BUN 24 (*) 6 - 23 mg/dL   Creatinine, Ser 1.10  0.50 - 1.10 mg/dL   Calcium 10.0  8.4 - 10.5 mg/dL   Total Protein 8.2  6.0 - 8.3 g/dL   Albumin 4.4  3.5 - 5.2 g/dL   AST 32  0 - 37 U/L   ALT 22  0 - 35 U/L   Alkaline Phosphatase 90  39 - 117 U/L   Total Bilirubin 0.7  0.3 - 1.2 mg/dL   GFR calc non Af Amer 42 (*) >90 mL/min   GFR calc Af Amer 49 (*) >90 mL/min   Comment: (NOTE)     The eGFR has been calculated using the CKD EPI equation.     This calculation has not been validated in all clinical situations.     eGFR's persistently <90 mL/min signify possible Chronic Kidney     Disease.  LIPASE, BLOOD     Status: None   Collection Time    12/28/13 12:00 PM      Result Value Ref Range   Lipase 35  11 - 59 U/L  I-STAT TROPOININ, ED     Status: None   Collection Time    12/28/13 12:11 PM      Result Value Ref Range   Troponin i, poc 0.02  0.00 - 0.08 ng/mL   Comment 3            Comment: Due to the release kinetics of cTnI,     a negative result within the first hours     of the onset of symptoms does not rule out     myocardial infarction with certainty.     If myocardial infarction is still suspected,     repeat the test at appropriate intervals.   Dg Abd Acute W/chest  12/28/2013   CLINICAL DATA:  Abdominal pain  EXAM: ACUTE ABDOMEN SERIES (ABDOMEN 2 VIEW & CHEST 1 VIEW)  COMPARISON:  PA and lateral chest x-ray of December 31, 2012  FINDINGS: The patient has undergone previous left mastectomy. The lungs are well-expanded. There is increased density in the right infrahilar region which is more conspicuous than on previous studies but which is not entirely new. The cardiac silhouette remains enlarged. The pulmonary vascularity is not engorged. There is no pleural effusion. There are degenerative  changes of both shoulders.  There is mild distention of the stomach with gas and fluid. There is a loop of mildly distended small bowel projecting over the right iliac crest. A few small bowel air-fluid levels are demonstrated elsewhere. There is a normal stool and gas pattern within the sigmoid and rectum. No free extraluminal gas collections are demonstrated. There are degenerative changes of the lumbar spine, the SI joints, and the right hip.  IMPRESSION: 1. There is an ileus or partial distal small bowel obstruction. There is no evidence of perforation. 2. Increased density in the right infrahilar region may reflect lymphadenopathy or parenchymal consolidation as might be seen with pneumonia. This merits follow-up chest CT scanning when the patient can tolerate the procedure.   Electronically Signed   By: David  Martinique   On: 12/28/2013 13:37       Assessment/Plan 1. PSBO, likely secondary to  small bowel malrotation Patient Active Problem List   Diagnosis Date Noted  . Small bowel obstruction 12/28/2013  . Essential hypertension 03/23/2013  . Hematoma 12/08/2012  . Nausea & vomiting 11/11/2012  . Diabetes mellitus 11/11/2012  . Hypothyroidism 11/11/2012  . Hx SBO 11/11/2012  . Colon cancer 11/11/2012  . Diastolic CHF, chronic 97/87/7654  . Venous insufficiency 11/11/2012  . Hyperlipidemia 11/11/2012  . OSA (obstructive sleep apnea) 11/11/2012  . HX: breast cancer 11/11/2012  . Hx of cervical cancer 11/11/2012  . H/O ovarian cancer 11/11/2012  . Hx of bladder cancer 11/11/2012  . S/P hysterectomy 11/11/2012  . PNA (pneumonia) 11/11/2012  . Hypokalemia 12/09/2011  . SBO (small bowel obstruction) 12/06/2011  . Chronic diastolic heart failure 86/88/5207  . Atrial fibrillation 02/17/2011   Plan: 1. NGT has been placed.  Will attempt conservative management and try to resolved this with bowel rest and NGT suction.  She has always resolved this way in the past.  She states that if she  doesn't, she would  NOT want an operation.  Repeat abdominal films in the AM.  We will follow along.  [name] E 12/28/2013, 4:11 PM Pager: [phone]

## 2013-12-28 NOTE — ED Notes (Signed)
Pt arrived via EMS c/o abd pain and distension for 2 days. HX of bowel obstruction non surgical candent.  Pt states LBM yesterday, continuous vomiting today. Pt is alert x4 rates pain 9/10

## 2013-12-28 NOTE — Consult Note (Signed)
Seen together. Patient and her daughter reaffirm patient's wish to have no surgery.  Hopefully, as in the past, NGT/conservative measures will work. Patient examined and I agree with the assessment and plan  Georganna Skeans, MD, MPH, FACS Trauma: [phone] General Surgery: [phone]  12/28/2013 10:12 PM

## 2013-12-29 ENCOUNTER — Inpatient Hospital Stay (HOSPITAL_COMMUNITY): Payer: Medicare Other

## 2013-12-29 DIAGNOSIS — I369 Nonrheumatic tricuspid valve disorder, unspecified: Secondary | ICD-10-CM

## 2013-12-29 DIAGNOSIS — I10 Essential (primary) hypertension: Secondary | ICD-10-CM

## 2013-12-29 DIAGNOSIS — R040 Epistaxis: Secondary | ICD-10-CM

## 2013-12-29 DIAGNOSIS — N179 Acute kidney failure, unspecified: Secondary | ICD-10-CM | POA: Diagnosis present

## 2013-12-29 LAB — COMPREHENSIVE METABOLIC PANEL
ALT: 20 U/L (ref 0–35)
AST: 27 U/L (ref 0–37)
Albumin: 4.2 g/dL (ref 3.5–5.2)
Alkaline Phosphatase: 82 U/L (ref 39–117)
BILIRUBIN TOTAL: 0.7 mg/dL (ref 0.3–1.2)
BUN: 45 mg/dL — AB (ref 6–23)
CHLORIDE: 102 meq/L (ref 96–112)
CO2: 20 mEq/L (ref 19–32)
Calcium: 9.3 mg/dL (ref 8.4–10.5)
Creatinine, Ser: 1.51 mg/dL — ABNORMAL HIGH (ref 0.50–1.10)
GFR calc non Af Amer: 29 mL/min — ABNORMAL LOW (ref >90)
GFR, EST AFRICAN AMERICAN: 33 mL/min — AB (ref >90)
GLUCOSE: 166 mg/dL — AB (ref 70–99)
POTASSIUM: 4.9 meq/L (ref 3.7–5.3)
Sodium: 141 mEq/L (ref 137–147)
Total Protein: 7.8 g/dL (ref 6.0–8.3)

## 2013-12-29 LAB — GLUCOSE, CAPILLARY
Glucose-Capillary: 98 mg/dL (ref 70–99)
Glucose-Capillary: 102 mg/dL — ABNORMAL HIGH (ref 70–99)
Glucose-Capillary: 117 mg/dL — ABNORMAL HIGH (ref 70–99)

## 2013-12-29 LAB — CBC
HCT: 46.9 % — ABNORMAL HIGH (ref 36.0–46.0)
Hemoglobin: 15.6 g/dL — ABNORMAL HIGH (ref 12.0–15.0)
MCH: 32.4 pg (ref 26.0–34.0)
MCHC: 33.3 g/dL (ref 30.0–36.0)
MCV: 97.3 fL (ref 78.0–100.0)
Platelets: 217 10*3/uL (ref 150–400)
RBC: 4.82 MIL/uL (ref 3.87–5.11)
RDW: 16.2 % — ABNORMAL HIGH (ref 11.5–15.5)
WBC: 9.6 10*3/uL (ref 4.0–10.5)

## 2013-12-29 LAB — TROPONIN I

## 2013-12-29 LAB — PROTIME-INR
INR: 1.3 (ref 0.00–1.49)
Prothrombin Time: 16.2 seconds — ABNORMAL HIGH (ref 11.6–15.2)

## 2013-12-29 MED ORDER — DILTIAZEM HCL 100 MG IV SOLR
5.0000 mg/h | INTRAVENOUS | Status: DC
  Administered 2013-12-29: 5 mg/h via INTRAVENOUS
  Administered 2013-12-29 – 2013-12-30 (×2): 10 mg/h via INTRAVENOUS
  Filled 2013-12-29 (×3): qty 100

## 2013-12-29 MED ORDER — SODIUM CHLORIDE 0.9 % IV BOLUS (SEPSIS)
1000.0000 mL | Freq: Once | INTRAVENOUS | Status: AC
  Administered 2013-12-29: 1000 mL via INTRAVENOUS

## 2013-12-29 MED ORDER — METOPROLOL TARTRATE 1 MG/ML IV SOLN
10.0000 mg | Freq: Four times a day (QID) | INTRAVENOUS | Status: DC | PRN
  Administered 2013-12-29: 10 mg via INTRAVENOUS
  Filled 2013-12-29 (×2): qty 10

## 2013-12-29 MED ORDER — INSULIN ASPART 100 UNIT/ML ~~LOC~~ SOLN: 0.0000 [IU] | SUBCUTANEOUS | Status: DC

## 2013-12-29 MED ORDER — INSULIN DETEMIR 100 UNIT/ML ~~LOC~~ SOLN
5.0000 [IU] | Freq: Every day | SUBCUTANEOUS | Status: DC
  Administered 2013-12-31 – 2014-01-02 (×2): 5 [IU] via SUBCUTANEOUS
  Filled 2013-12-29 (×6): qty 0.05

## 2013-12-29 MED ORDER — DILTIAZEM LOAD VIA INFUSION
15.0000 mg | Freq: Once | INTRAVENOUS | Status: AC
  Administered 2013-12-29: 15 mg via INTRAVENOUS
  Filled 2013-12-29: qty 15

## 2013-12-29 NOTE — Progress Notes (Signed)
1745 reevaluated by Dr. Olevia Bowens. No reinsertion this time

## 2013-12-29 NOTE — Progress Notes (Signed)
PT Cancellation Note  Patient Details Name: [name] MRN: [id number] DOB: Feb 02, 1920   Cancelled Treatment:    Reason Eval/Treat Not Completed: Medical issues which prohibited therapy. Per RN, therapy not appropriate at this time due to active nose bleed. Will continue to follow and eval when able.    Jolyn Lent 12/29/2013, 3:58 PM  Jolyn Lent, PT, DPT Acute Rehabilitation Services Pager: [phone]

## 2013-12-29 NOTE — Progress Notes (Signed)
Patient ID: [name], female   DOB: 1919-07-10, 80 y.o.   MRN: [id number]    Subjective: Pt c/o bleeding to death.  She has had a slow bleed from her nose all night.  Patient is on eliquis.  No flatus yet.  Objective: Vital signs in last 24 hours: Temp:  [97.4 F (36.3 C)-97.6 F (36.4 C)] 97.5 F (36.4 C) (06/30 0529) Pulse Rate:  [44-155] 77 (06/30 0529) Resp:  [17-22] 18 (06/30 0529) BP: (106-157)/(44-103) 127/44 mmHg (06/30 0529) SpO2:  [92 %-100 %] 96 % (06/30 0529) Weight:  [195 lb 15.8 oz (88.9 kg)-198 lb 3.1 oz (89.9 kg)] 195 lb 15.8 oz (88.9 kg) (06/30 0529) Last BM Date: 12/27/13  Intake/Output from previous day: 06/29 0701 - 06/30 0700 In: -  Out: 900 [Urine:200; Emesis/NG output:700] Intake/Output this shift: Total I/O In: -  Out: 200 [Emesis/NG output:200]  PE: Abd: softer, but still with distention, old dark blood and bilious output in NGT, few BS, mildly tender Mouth is stained with old blood dripping from nose.  Lab Results:   Recent Labs  12/28/13 1200 12/29/13 0454  WBC 7.3 9.6  HGB 16.0* 15.6*  HCT 48.0* 46.9*  PLT 203 217   BMET  Recent Labs  12/28/13 1200 12/29/13 0454  NA 144 141  K 4.5 4.9  CL 100 102  CO2 26 20  GLUCOSE 127* 166*  BUN 24* 45*  CREATININE 1.10 1.51*  CALCIUM 10.0 9.3   PT/INR  Recent Labs  12/29/13 0454  LABPROT 16.2*  INR 1.30   CMP     Component Value Date/Time   NA 141 12/29/2013 0454   K 4.9 12/29/2013 0454   CL 102 12/29/2013 0454   CO2 20 12/29/2013 0454   GLUCOSE 166* 12/29/2013 0454   BUN 45* 12/29/2013 0454   CREATININE 1.51* 12/29/2013 0454   CALCIUM 9.3 12/29/2013 0454   PROT 7.8 12/29/2013 0454   ALBUMIN 4.2 12/29/2013 0454   AST 27 12/29/2013 0454   ALT 20 12/29/2013 0454   ALKPHOS 82 12/29/2013 0454   BILITOT 0.7 12/29/2013 0454   GFRNONAA 29* 12/29/2013 0454   GFRAA 33* 12/29/2013 0454   Lipase     Component Value Date/Time   LIPASE 35 12/28/2013 1200       Studies/Results: Dg Abd  Acute W/chest  12/28/2013   CLINICAL DATA:  Abdominal pain  EXAM: ACUTE ABDOMEN SERIES (ABDOMEN 2 VIEW & CHEST 1 VIEW)  COMPARISON:  PA and lateral chest x-ray of December 31, 2012  FINDINGS: The patient has undergone previous left mastectomy. The lungs are well-expanded. There is increased density in the right infrahilar region which is more conspicuous than on previous studies but which is not entirely new. The cardiac silhouette remains enlarged. The pulmonary vascularity is not engorged. There is no pleural effusion. There are degenerative changes of both shoulders.  There is mild distention of the stomach with gas and fluid. There is a loop of mildly distended small bowel projecting over the right iliac crest. A few small bowel air-fluid levels are demonstrated elsewhere. There is a normal stool and gas pattern within the sigmoid and rectum. No free extraluminal gas collections are demonstrated. There are degenerative changes of the lumbar spine, the SI joints, and the right hip.  IMPRESSION: 1. There is an ileus or partial distal small bowel obstruction. There is no evidence of perforation. 2. Increased density in the right infrahilar region may reflect lymphadenopathy or parenchymal consolidation as might be  seen with pneumonia. This merits follow-up chest CT scanning when the patient can tolerate the procedure.   Electronically Signed   By: David  Martinique   On: 12/28/2013 13:37    Anti-infectives: Anti-infectives   None       Assessment/Plan  1. SBO secondary to small bowel malrotation 2. Epistaxis from NGT trauma 3. A fib, on eliquis (this is being held)  Plan: 1. Cont NGT as patient has not progressed with her bowel obstruction yet.  I have spoken to Dr. Olevia Bowens about her epistaxis.  Her eliquis is already being held.  Will defer need for ENT or further management to him. 2. Follow films in the morning.   LOS: 1 day    Cozy Veale E 12/29/2013, 8:07 AM Pager: [phone]

## 2013-12-29 NOTE — Progress Notes (Signed)
Patient heart rate in the 160's. At first patient's heart rate would increase when up to the bedside commode with assistance. But later in the shift up until now, nurse has noticed that heart rate has been frequently spiking up to the 150-160's. Will administer metoprolol per PRN order and continue to monitor heart rate to end of shift.

## 2013-12-29 NOTE — Progress Notes (Signed)
0730 still with intermittent nasal bleeding . No pain . Digital pressure applied . Made a call to MD suggest EENT consult   Seen by Surgical PA . Aware of x ray result

## 2013-12-29 NOTE — Progress Notes (Signed)
Echocardiogram 2D Echocardiogram has been performed.  BROWN, CALEBA 12/29/2013, 9:50 AM

## 2013-12-29 NOTE — Progress Notes (Signed)
From initial note in regards to nosebleed at earlier part of shift, failed to mention that nasal spray was ordered to help control bleeding of nose. This morning notified doctor once more that nose has continued to bleed even after giving nasal spray and also that abdomen continues to stay distended even with NG tube to low intermittent suction. Also notified doctor that gastric fluid just stays in tubing without going into canister. Doctor ordered STAT Xray of abdomen and to directed nurse to pull NG tube a little bit out then push back in to avoid the suctioning adhering to the walls of stomach. After nurse has done that, check for placement and do before xray arrives. Doctors orders carried out as noted above. Was able to auscultate placement with air infused. Waiting for Xray to arrive at this time to also confirm placement. Will continue to monitor to end of shift.

## 2013-12-29 NOTE — Progress Notes (Signed)
Nasal bleeding seems to have stopped. Abdomen somewhat softer.Some BS, no flatus yet.  Georganna Skeans, MD, MPH, FACS Trauma: [phone] General Surgery: [phone]

## 2013-12-29 NOTE — Progress Notes (Signed)
Notified rapid response nurse to evaluate patient for patient has had a rough time. Has vomited X2 and abdomen is still distended while NG tube is still in place to low suction. Patient's bleeding of the nose has begun to subside some from when the Nasal spray (neo-synephrine) was given but still is mildly bleeding intermittently. Patient is up in recliner for she had requested to be in chair, for pt states she sleeps in chair at home.  Currently patient is resting. Will continue to monitor patient to end of shift.

## 2013-12-29 NOTE — Progress Notes (Signed)
1600 coughed ogt out with  clotted blood . Pt in no acute distress . Digital pressure applied to nostrils until bleeding ceased . Mouth care done  Dr Olevia Bowens  Notified To keep  Ogt . Monitor pt .Will  Come and evaluate pt

## 2013-12-29 NOTE — Progress Notes (Signed)
1930 referred pt to MD :heart rate afib .>150 -160 with orders . Carried out

## 2013-12-29 NOTE — Progress Notes (Signed)
[facility] PROGRESS NOTE    Assessment/Plan: Small bowel obstruction - NPO, cont NG tube to suction, appreciate surgery assistance. - cont IV fluids, avoid narcotics, correct electrolytes. - abd x-ray : ileus or partial distal small bowel obstruction. On the upright film there has been some improvement in the appearance of the small bowel.  AKI (acute kidney injury): - pre-renal, bolus NS cont Hydration, stricit I and os' - b-met in am. Cont to hold ACE and lasix.  Epistaxis: - hold elequis. - afrin spray.  Diabetes mellitus: - check HbA1c, start low dose levemir and SSI.  Atrial fibrillation - With RVR   Chronic diastolic heart failure    Code Status: dnr  Family Communication: bedside  Disposition Plan: admit     Consultants:  Surgery  Procedures:  none  Antibiotics:  None  HPI/Subjective: Nose bleeding  Objective: Filed Vitals:   12/28/13 2055 12/29/13 0100 12/29/13 0529 12/29/13 0947  BP: 157/77 106/50 127/44 116/89  Pulse: 52 155 77 136  Temp: 97.4 F (36.3 C)  97.5 F (36.4 C)   TempSrc: Oral  Oral   Resp: _0 Weight:   88.9 kg (195 lb 15.8 oz)   SpO2: 92%  96% 97%    Intake/Output Summary (Last 24 hours) at 12/29/13 1042 Last data filed at 12/29/13 0900  Gross per 24 hour  Intake      0 ml  Output   1100 ml  Net  -1100 ml   Filed Weights   12/28/13 1730 12/29/13 0529  Weight: 89.9 kg (198 lb 3.1 oz) 88.9 kg (195 lb 15.8 oz)    Exam:  General: Alert, awake, oriented x3. HEENT: No bruits, no goiter.  Heart: Regular rate and rhythm, without murmurs, rubs, gallops.  Lungs: Good air movement, clear Abdomen: Soft, nontender, nondistended, positive bowel sounds.     Data Reviewed: Basic Metabolic Panel:  Recent Labs Lab 12/28/13 1200 12/29/13 0454  NA 144 141  K 4.5 4.9  CL 100 102  CO2 26 20  GLUCOSE 127* 166*  BUN 24* 45*  CREATININE 1.10 1.51*  CALCIUM 10.0 9.3   Liver Function Tests:  Recent  Labs Lab 12/28/13 1200 12/29/13 0454  AST 32 27  ALT 22 20  ALKPHOS 90 82  BILITOT 0.7 0.7  PROT 8.2 7.8  ALBUMIN 4.4 4.2    Recent Labs Lab 12/28/13 1200  LIPASE 35   No results found for this basename: AMMONIA,  in the last 168 hours CBC:  Recent Labs Lab 12/28/13 1200 12/29/13 0454  WBC 7.3 9.6  NEUTROABS 5.9  --   HGB 16.0* 15.6*  HCT 48.0* 46.9*  MCV 97.2 97.3  PLT 203 217   Cardiac Enzymes:  Recent Labs Lab 12/28/13 1545 12/28/13 2108 12/29/13 0454  TROPONINI <0.30 <0.30 <0.30   BNP (last 3 results) No results found for this basename: PROBNP,  in the last 8760 hours CBG: No results found for this basename: GLUCAP,  in the last 168 hours  No results found for this or any previous visit (from the past 240 hour(s)).   Studies: Dg Abd Acute W/chest  12/28/2013   CLINICAL DATA:  Abdominal pain  EXAM: ACUTE ABDOMEN SERIES (ABDOMEN 2 VIEW & CHEST 1 VIEW)  COMPARISON:  PA and lateral chest x-ray of December 31, 2012  FINDINGS: The patient has undergone previous left mastectomy. The lungs are well-expanded. There is increased density in the right infrahilar region which is more conspicuous than  on previous studies but which is not entirely new. The cardiac silhouette remains enlarged. The pulmonary vascularity is not engorged. There is no pleural effusion. There are degenerative changes of both shoulders.  There is mild distention of the stomach with gas and fluid. There is a loop of mildly distended small bowel projecting over the right iliac crest. A few small bowel air-fluid levels are demonstrated elsewhere. There is a normal stool and gas pattern within the sigmoid and rectum. No free extraluminal gas collections are demonstrated. There are degenerative changes of the lumbar spine, the SI joints, and the right hip.  IMPRESSION: 1. There is an ileus or partial distal small bowel obstruction. There is no evidence of perforation. 2. Increased density in the right infrahilar  region may reflect lymphadenopathy or parenchymal consolidation as might be seen with pneumonia. This merits follow-up chest CT scanning when the patient can tolerate the procedure.   Electronically Signed   By: David  Martinique   On: 12/28/2013 13:37   Dg Abd Portable 2v  12/29/2013   CLINICAL DATA:  Abdominal pain  EXAM: PORTABLE ABDOMEN - 2 VIEW  COMPARISON:  Acute abdominal series dated December 28, 2013  FINDINGS: A supine and left-side-down decubitus film are reviewed. An NG tube is in place with the tip and proximal port in the pyloric region. There is mild gaseous distention of the stomach. No free extraluminal gas collections are demonstrated. A few small bowel air-fluid levels are noted on the decubitus film. There is a small amount of gas within bowel in the pelvis. No abnormal soft tissue calcifications are demonstrated. Limited evaluation of the bony structures reveals no acute abnormality. The lung bases are clear where visualized.  IMPRESSION: The bowel gas pattern is consistent with a mild ileus or partial distal small bowel obstruction. On the upright film there has been some improvement in the appearance of the small bowel. A moderate amount of gas within the stomach may reflect the NG suction being turned off. There is no evidence of perforation.   Electronically Signed   By: David  Martinique   On: 12/29/2013 08:06    Scheduled Meds: . antiseptic oral rinse  15 mL Mouth Rinse BID  . sodium chloride  1,000 mL Intravenous Once   Continuous Infusions: . sodium chloride 75 mL/hr at 12/29/13 0600  . sodium chloride       Charlynne Cousins  [facility] Pager [phone]. If 8PM-8AM, please contact night-coverage at [email or web address], password [email or web address] 12/29/2013, 10:42 AM  LOS: 1 day      **Disclaimer: This note may have been dictated with voice recognition software. Similar sounding words can inadvertently be transcribed and this note may contain transcription errors which may not have been  corrected upon publication of note.**

## 2013-12-30 ENCOUNTER — Inpatient Hospital Stay (HOSPITAL_COMMUNITY): Payer: Medicare Other

## 2013-12-30 DIAGNOSIS — E039 Hypothyroidism, unspecified: Secondary | ICD-10-CM

## 2013-12-30 DIAGNOSIS — E1159 Type 2 diabetes mellitus with other circulatory complications: Secondary | ICD-10-CM

## 2013-12-30 LAB — BASIC METABOLIC PANEL
BUN: 65 mg/dL — ABNORMAL HIGH (ref 6–23)
CALCIUM: 8.4 mg/dL (ref 8.4–10.5)
CO2: 22 meq/L (ref 19–32)
CREATININE: 1.02 mg/dL (ref 0.50–1.10)
Chloride: 105 mEq/L (ref 96–112)
GFR calc Af Amer: 54 mL/min — ABNORMAL LOW (ref >90)
GFR calc non Af Amer: 46 mL/min — ABNORMAL LOW (ref >90)
Glucose, Bld: 106 mg/dL — ABNORMAL HIGH (ref 70–99)
Potassium: 4.4 mEq/L (ref 3.7–5.3)
Sodium: 142 mEq/L (ref 137–147)

## 2013-12-30 LAB — GLUCOSE, CAPILLARY
GLUCOSE-CAPILLARY: 101 mg/dL — AB (ref 70–99)
GLUCOSE-CAPILLARY: 123 mg/dL — AB (ref 70–99)
GLUCOSE-CAPILLARY: 71 mg/dL (ref 70–99)
Glucose-Capillary: 99 mg/dL (ref 70–99)
Glucose-Capillary: 106 mg/dL — ABNORMAL HIGH (ref 70–99)
Glucose-Capillary: 102 mg/dL — ABNORMAL HIGH (ref 70–99)
Glucose-Capillary: 65 mg/dL — ABNORMAL LOW (ref 70–99)
Glucose-Capillary: 94 mg/dL (ref 70–99)

## 2013-12-30 MED ORDER — LEVOTHYROXINE SODIUM 200 MCG PO TABS
200.0000 ug | ORAL_TABLET | Freq: Every day | ORAL | Status: DC
Start: 2013-12-30 — End: 2014-01-03
  Administered 2013-12-30 – 2014-01-03 (×5): 200 ug via ORAL
  Filled 2013-12-30 (×6): qty 1

## 2013-12-30 MED ORDER — DILTIAZEM HCL ER COATED BEADS 360 MG PO CP24
360.0000 mg | ORAL_CAPSULE | Freq: Every morning | ORAL | Status: DC
  Administered 2013-12-30 – 2014-01-03 (×5): 360 mg via ORAL
  Filled 2013-12-30 (×5): qty 1

## 2013-12-30 MED ORDER — BISACODYL 10 MG RE SUPP
10.0000 mg | Freq: Every day | RECTAL | Status: DC
  Filled 2013-12-30: qty 1

## 2013-12-30 MED ORDER — BISACODYL 5 MG PO TBEC
5.0000 mg | DELAYED_RELEASE_TABLET | ORAL | Status: DC
  Administered 2013-12-30 – 2014-01-03 (×3): 5 mg via ORAL
  Filled 2013-12-30 (×3): qty 1

## 2013-12-30 MED ORDER — RAMIPRIL 10 MG PO CAPS
10.0000 mg | ORAL_CAPSULE | Freq: Every day | ORAL | Status: DC
  Administered 2013-12-30 – 2014-01-03 (×5): 10 mg via ORAL
  Filled 2013-12-30 (×5): qty 1

## 2013-12-30 MED ORDER — APIXABAN 5 MG PO TABS
5.0000 mg | ORAL_TABLET | Freq: Two times a day (BID) | ORAL | Status: DC
  Administered 2013-12-30 – 2014-01-03 (×9): 5 mg via ORAL
  Filled 2013-12-30 (×10): qty 1

## 2013-12-30 MED ORDER — INSULIN ASPART 100 UNIT/ML ~~LOC~~ SOLN: 0.0000 [IU] | Freq: Three times a day (TID) | SUBCUTANEOUS | Status: DC

## 2013-12-30 NOTE — Progress Notes (Signed)
Patient alert and oriented x4, vital signs stable.  Consistently relayed to patient that as diet transitioned from NPO to clear liquids, she should take small sips.  Patient's daughter at bedside this AM, reiterated this information to patient.  Patient received lunch tray of clear liquids and proceeded to feel nauseous after consuming lunch, stating "I'm hard headed, but I learned my lesson".  Patient requested PO PRN ondansetron.  Medication administered, will continue to monitor.

## 2013-12-30 NOTE — Progress Notes (Addendum)
Patient's CBGs changed from q4 hours to ACHS with telephone order from MD as patient transitioned from NPO to clear liquid diet.  Diltiazem transitioned from IV to PO per order.  Will continue to monitor.

## 2013-12-30 NOTE — Progress Notes (Signed)
[facility] PROGRESS NOTE   Assessment/Plan: Small bowel obstruction -NGT sneezed out on 12/29/13; patient endorses having no focal or episode of abdominal pain, nausea, vomiting or any other acute complaints. -Following surgery recommendations will advance diet to clear liquids -IV fluids change to 50 cc per hour and will continue correcting electrolytes as needed. -Continue supportive care  AKI (acute kidney injury): -pre-renal in etiology -Resolved with IV fluid resuscitation  Epistaxis: -Resolved after holding eliquis and using afrin spray. -Will monitor her hemoglobin with a CBC in a.m.  Diabetes mellitus: -HbA1c pending -Continue sliding scale insulin  Atrial fibrillation -Rate control -Will continue diltiazem,  and eliquis  History of hypothyroidism: Will continue Synthroid  Chronic diastolic heart failure: Compensated. -Follow daily weights and strict I.'s and O.'s.  Hypertension: Stable and well controlled. Will continue ramipril 10 mg by mouth daily and the use of diltiazem   DVT: SCDs.   Code Status: dnr  Family Communication: bedside  Disposition Plan: admit    Consultants:  Surgery  Procedures:  none  Antibiotics:  None  HPI/Subjective: Nose bleeding  Objective: Filed Vitals:   12/30/13 0454 12/30/13 0900 12/30/13 1236 12/30/13 1300  BP: 142/61 142/72  110/57  Pulse: 94 90  80  Temp: 97.4 F (36.3 C)   97.1 F (36.2 C)  TempSrc: Oral   Oral  Resp: 20 18  18   Height:   5\' 1"  (1.549 m)   Weight: 89.721 kg (197 lb 12.8 oz)     SpO2: 96% 98%  98%    Intake/Output Summary (Last 24 hours) at 12/30/13 1550 Last data filed at 12/30/13 1500  Gross per 24 hour  Intake    460 ml  Output   1176 ml  Net   -716 ml   Filed Weights   12/28/13 1730 12/29/13 0529 12/30/13 0454  Weight: 89.9 kg (198 lb 3.1 oz) 88.9 kg (195 lb 15.8 oz) 89.721 kg (197 lb 12.8 oz)    Exam:  General: Alert, awake, oriented x3. HEENT: No bruits, no  goiter.  Heart: Regular rate and rhythm, without murmurs, rubs, gallops.  Lungs: Good air movement, clear Abdomen: Soft, nontender, nondistended, positive bowel sounds.     Data Reviewed: Basic Metabolic Panel:  Recent Labs Lab 12/28/13 1200 12/29/13 0454 12/30/13 0447  NA 144 141 142  K 4.5 4.9 4.4  CL 100 102 105  CO2 26 20 22   GLUCOSE 127* 166* 106*  BUN 24* 45* 65*  CREATININE 1.10 1.51* 1.02  CALCIUM 10.0 9.3 8.4   Liver Function Tests:  Recent Labs Lab 12/28/13 1200 12/29/13 0454  AST 32 27  ALT 22 20  ALKPHOS 90 82  BILITOT 0.7 0.7  PROT 8.2 7.8  ALBUMIN 4.4 4.2    Recent Labs Lab 12/28/13 1200  LIPASE 35   No results found for this basename: AMMONIA,  in the last 168 hours CBC:  Recent Labs Lab 12/28/13 1200 12/29/13 0454  WBC 7.3 9.6  NEUTROABS 5.9  --   HGB 16.0* 15.6*  HCT 48.0* 46.9*  MCV 97.2 97.3  PLT 203 217   Cardiac Enzymes:  Recent Labs Lab 12/28/13 1545 12/28/13 2108 12/29/13 0454  TROPONINI <0.30 <0.30 <0.30   BNP (last 3 results) No results found for this basename: PROBNP,  in the last 8760 hours CBG:  Recent Labs Lab 12/30/13 0022 12/30/13 0447 12/30/13 0554 12/30/13 0741 12/30/13 1156  GLUCAP 123* 101* 99 106* 102*    No results found for this  or any previous visit (from the past 240 hour(s)).   Studies: Dg Abd Portable 1v  12/30/2013   CLINICAL DATA:  Recheck small-bowel obstruction  EXAM: PORTABLE ABDOMEN - 1 VIEW  COMPARISON:  12/29/2013  FINDINGS: NG tube is been removed. Gas in nondilated large and small bowel. Small amount of gas and stool in the rectum. Interval decompression of the stomach.  Upright or decubitus views not obtained today to evaluate for air-fluid level or free air.  IMPRESSION: Nonobstructive bowel gas pattern.   Electronically Signed   By: Franchot Gallo M.D.   On: 12/30/2013 08:59   Dg Abd Portable 2v  12/29/2013   CLINICAL DATA:  Abdominal pain  EXAM: PORTABLE ABDOMEN - 2 VIEW   COMPARISON:  Acute abdominal series dated December 28, 2013  FINDINGS: A supine and left-side-down decubitus film are reviewed. An NG tube is in place with the tip and proximal port in the pyloric region. There is mild gaseous distention of the stomach. No free extraluminal gas collections are demonstrated. A few small bowel air-fluid levels are noted on the decubitus film. There is a small amount of gas within bowel in the pelvis. No abnormal soft tissue calcifications are demonstrated. Limited evaluation of the bony structures reveals no acute abnormality. The lung bases are clear where visualized.  IMPRESSION: The bowel gas pattern is consistent with a mild ileus or partial distal small bowel obstruction. On the upright film there has been some improvement in the appearance of the small bowel. A moderate amount of gas within the stomach may reflect the NG suction being turned off. There is no evidence of perforation.   Electronically Signed   By: David  Martinique   On: 12/29/2013 08:06    Scheduled Meds: . antiseptic oral rinse  15 mL Mouth Rinse BID  . apixaban  5 mg Oral BID  . bisacodyl  5 mg Oral BH-q7a  . bisacodyl  10 mg Rectal Daily  . diltiazem  360 mg Oral q morning - 10a  . insulin aspart  0-9 Units Subcutaneous 6 times per day  . insulin detemir  5 Units Subcutaneous QHS  . levothyroxine  200 mcg Oral QAC breakfast  . ramipril  10 mg Oral Daily   Continuous Infusions: . sodium chloride Stopped (12/29/13 1221)     Barton Dubois  [facility] Pager [phone]. If 8PM-8AM, please contact night-coverage at [email or web address], password [facility] 12/30/2013, 3:50 PM  LOS: 2 days      **Disclaimer: This note may have been dictated with voice recognition software. Similar sounding words can inadvertently be transcribed and this note may contain transcription errors which may not have been corrected upon publication of note.**

## 2013-12-30 NOTE — Progress Notes (Signed)
[facility] Progress Note     Subjective: Pt feels great.  Pain resolved.  No N/V.  Had a few small BM's.  Wants dulcolax.  NG was sneezed out yesterday.  Nose bleeding resolved.  Mobilizing to bedside commode well.  Hungry and thirsty.    Objective: Vital signs in last 24 hours: Temp:  [97 F (36.1 C)-98 F (36.7 C)] 97.4 F (36.3 C) (07/01 0454) Pulse Rate:  [78-144] 94 (07/01 0454) Resp:  [17-20] 20 (07/01 0454) BP: (105-142)/(56-89) 142/61 mmHg (07/01 0454) SpO2:  [96 %-98 %] 96 % (07/01 0454) Weight:  [197 lb 12.8 oz (89.721 kg)] 197 lb 12.8 oz (89.721 kg) (07/01 0454) Last BM Date: 12/29/13  Intake/Output from previous day: 06/30 0701 - 07/01 0700 In: 100 [P.O.:100] Out: 663 [Urine:400; Emesis/NG output:260; Stool:3] Intake/Output this shift:    PE: Gen:  Alert, NAD, pleasant Abd: Obese, soft, NT/ND, +BS, no HSM   Lab Results:   Recent Labs  12/28/13 1200 12/29/13 0454  WBC 7.3 9.6  HGB 16.0* 15.6*  HCT 48.0* 46.9*  PLT 203 217   BMET  Recent Labs  12/29/13 0454 12/30/13 0447  NA 141 142  K 4.9 4.4  CL 102 105  CO2 20 22  GLUCOSE 166* 106*  BUN 45* 65*  CREATININE 1.51* 1.02  CALCIUM 9.3 8.4   PT/INR  Recent Labs  12/29/13 0454  LABPROT 16.2*  INR 1.30   CMP     Component Value Date/Time   NA 142 12/30/2013 0447   K 4.4 12/30/2013 0447   CL 105 12/30/2013 0447   CO2 22 12/30/2013 0447   GLUCOSE 106* 12/30/2013 0447   BUN 65* 12/30/2013 0447   CREATININE 1.02 12/30/2013 0447   CALCIUM 8.4 12/30/2013 0447   PROT 7.8 12/29/2013 0454   ALBUMIN 4.2 12/29/2013 0454   AST 27 12/29/2013 0454   ALT 20 12/29/2013 0454   ALKPHOS 82 12/29/2013 0454   BILITOT 0.7 12/29/2013 0454   GFRNONAA 46* 12/30/2013 0447   GFRAA 54* 12/30/2013 0447   Lipase     Component Value Date/Time   LIPASE 35 12/28/2013 1200       Studies/Results: Dg Abd Acute W/chest  12/28/2013   CLINICAL DATA:  Abdominal pain  EXAM: ACUTE ABDOMEN SERIES (ABDOMEN 2 VIEW & CHEST  1 VIEW)  COMPARISON:  PA and lateral chest x-ray of December 31, 2012  FINDINGS: The patient has undergone previous left mastectomy. The lungs are well-expanded. There is increased density in the right infrahilar region which is more conspicuous than on previous studies but which is not entirely new. The cardiac silhouette remains enlarged. The pulmonary vascularity is not engorged. There is no pleural effusion. There are degenerative changes of both shoulders.  There is mild distention of the stomach with gas and fluid. There is a loop of mildly distended small bowel projecting over the right iliac crest. A few small bowel air-fluid levels are demonstrated elsewhere. There is a normal stool and gas pattern within the sigmoid and rectum. No free extraluminal gas collections are demonstrated. There are degenerative changes of the lumbar spine, the SI joints, and the right hip.  IMPRESSION: 1. There is an ileus or partial distal small bowel obstruction. There is no evidence of perforation. 2. Increased density in the right infrahilar region may reflect lymphadenopathy or parenchymal consolidation as might be seen with pneumonia. This merits follow-up chest CT scanning when the patient can tolerate the procedure.   Electronically Signed  By: David  Martinique   On: 12/28/2013 13:37   Dg Abd Portable 2v  12/29/2013   CLINICAL DATA:  Abdominal pain  EXAM: PORTABLE ABDOMEN - 2 VIEW  COMPARISON:  Acute abdominal series dated December 28, 2013  FINDINGS: A supine and left-side-down decubitus film are reviewed. An NG tube is in place with the tip and proximal port in the pyloric region. There is mild gaseous distention of the stomach. No free extraluminal gas collections are demonstrated. A few small bowel air-fluid levels are noted on the decubitus film. There is a small amount of gas within bowel in the pelvis. No abnormal soft tissue calcifications are demonstrated. Limited evaluation of the bony structures reveals no acute  abnormality. The lung bases are clear where visualized.  IMPRESSION: The bowel gas pattern is consistent with a mild ileus or partial distal small bowel obstruction. On the upright film there has been some improvement in the appearance of the small bowel. A moderate amount of gas within the stomach may reflect the NG suction being turned off. There is no evidence of perforation.   Electronically Signed   By: David  Martin[place]   On: 12/29/2013 08:06    Anti-infectives: Anti-infectives   None       Assessment/Plan 1. 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 Epistaxis from NGT trauma  - resolved now 3. A fib, on eliquis (this is being held)   Plan:  1. Nose bleeding stopped.  Apparently NG was "sneezed" out.  Will recheck KUB.   2. Film yesterday showed improvement, patient states pain has resolved.  She said she had very small BM's and having flatus.   3.  Start with sips and if tolerating this this morning then can have clear liquids at lunch 4.  She is still against surgery completely 5.  Can likely advance diet as tolerated and d/c home per medicine in a few days if she continues to improve    LOS: 2 days    DORT, [facility] 12/30/2013, 7:28 AM Pager: [phone]

## 2013-12-30 NOTE — Evaluation (Signed)
Physical Therapy Evaluation Patient Details Name: [name] MRN: [id number] DOB: April 01, 1920 Today's Date: 12/30/2013   History of Present Illness    80 yo white female comes in c/o gradual onset and worsening of persistent abdominal discomfort , bloating and inability to have a BM ,with intermittent episodes of N/V that began 2 to 3 days ago. Pt with SBO treted with NGT and NPO. Pt with Epistaxis from NGT trauma.     Clinical Impression  Pt adm from home due to the above. Pt presents with decreased independence with functional mobility overall decreased activity tolerance secondary to deficits indicated below. Pt to benefit from skilled acute PT to address deficits indicated below and maximize functional mobility prior to D/C home with family. Pt very motivated to return to independence. Requesting water and food throughout session.   Follow Up Recommendations No PT follow up;Supervision/Assistance - 24 hour    Equipment Recommendations  None recommended by PT    Recommendations for Other Services OT consult     Precautions / Restrictions Precautions Precautions: None Restrictions Weight Bearing Restrictions: No      Mobility  Bed Mobility Overal bed mobility: Modified Independent             General bed mobility comments: HOB elevated and use of handrails; no physical (A) needed   Transfers Overall transfer level: Needs assistance Equipment used: Rolling walker (2 wheeled) Transfers: Sit to/from Stand Sit to Stand: Supervision         General transfer comment: supervision for cues for safety and hand placement with RW   Ambulation/Gait Ambulation/Gait assistance: Min guard Ambulation Distance (Feet): 60 Feet Assistive device: Rolling walker (2 wheeled) Gait Pattern/deviations: Step-through pattern;Wide base of support;Trunk flexed;Decreased stride length Gait velocity: decreased Gait velocity interpretation: Below normal speed for age/gender General Gait  Details: min guard to steady primarily with directional changes; cues for gt sequencing and safety with RW; limited due to fatigue   Stairs            Wheelchair Mobility    Modified Rankin (Stroke Patients Only)       Balance Overall balance assessment: Needs assistance Sitting-balance support: Feet supported;No upper extremity supported Sitting balance-Leahy Scale: Good     Standing balance support: During functional activity;Bilateral upper extremity supported Standing balance-Leahy Scale: Poor Standing balance comment: relies heavily on UE support                             Pertinent Vitals/Pain No c/o pain during session    Home Living Family/patient expects to be discharged to:: Private residence Living Arrangements: Children Available Help at Discharge: Family;Available PRN/intermittently (can be 24/7 (A) as needed ) Type of Home: House Home Access: Level entry     Home Layout: One level Home Equipment: Walker - 2 wheels;Shower seat;Bedside commode;Grab bars - tub/shower;Grab bars - toilet Additional Comments: pt has handicap bathroom; has a daughter that is a retired Marine scientist and a daughter who lives with her who is a Pharmacist, hospital and will be able to provide 24/7 (A) as needed     Prior Function Level of Independence: Independent with assistive device(s)         Comments: ambulates with RW; pt reports she does her housework and Chiropodist Dominance   Dominant Hand: Right    Extremity/Trunk Assessment   Upper Extremity Assessment: Defer to OT evaluation  Lower Extremity Assessment: Generalized weakness      Cervical / Trunk Assessment: Kyphotic  Communication   Communication: No difficulties  Cognition Arousal/Alertness: Awake/alert Behavior During Therapy: WFL for tasks assessed/performed Overall Cognitive Status: Within Functional Limits for tasks assessed                      General Comments General  comments (skin integrity, edema, etc.): pt very motivated to return home and to her independence     Exercises General Exercises - Lower Extremity Ankle Circles/Pumps: AROM;Both;15 reps;Seated      Assessment/Plan    PT Assessment Patient needs continued PT services  PT Diagnosis Abnormality of gait;Generalized weakness   PT Problem List Decreased strength;Decreased activity tolerance;Decreased balance;Decreased mobility;Decreased knowledge of use of DME  PT Treatment Interventions DME instruction;Gait training;Functional mobility training;Therapeutic activities;Therapeutic exercise;Balance training;Neuromuscular re-education;Patient/family education   PT Goals (Current goals can be found in the Care Plan section) Acute Rehab PT Goals Patient Stated Goal: to be independent  PT Goal Formulation: With patient Time For Goal Achievement: 01/05/14 Potential to Achieve Goals: Good    Frequency Min 3X/week   Barriers to discharge   pt would like to be mod I upon D/C home     Co-evaluation               End of Session Equipment Utilized During Treatment: Gait belt Activity Tolerance: Patient limited by fatigue Patient left: in chair;with call bell/phone within reach;with family/visitor present Nurse Communication: Mobility status         Time: 9528-4132 PT Time Calculation (min): 20 min   Charges:   PT Evaluation $Initial PT Evaluation Tier I: 1 Procedure PT Treatments $Gait Training: 8-22 mins   PT G CodesGustavus Bryant, [place] [phone] 12/30/2013, 11:39 AM

## 2013-12-30 NOTE — Progress Notes (Signed)
Finished her entire clears tray quickly and now feels a bit nauseated. Passing a lot of gas. Patient examined and I agree with the assessment and plan  Georganna Skeans, MD, MPH, FACS Trauma: [phone] General Surgery: [phone]  12/30/2013 3:32 PM

## 2013-12-31 DIAGNOSIS — D500 Iron deficiency anemia secondary to blood loss (chronic): Secondary | ICD-10-CM

## 2013-12-31 LAB — CBC
HEMATOCRIT: 32 % — AB (ref 36.0–46.0)
HEMOGLOBIN: 10.6 g/dL — AB (ref 12.0–15.0)
MCH: 32 pg (ref 26.0–34.0)
MCHC: 33.1 g/dL (ref 30.0–36.0)
MCV: 96.7 fL (ref 78.0–100.0)
Platelets: 151 10*3/uL (ref 150–400)
RBC: 3.31 MIL/uL — ABNORMAL LOW (ref 3.87–5.11)
RDW: 16.4 % — AB (ref 11.5–15.5)
WBC: 5.3 10*3/uL (ref 4.0–10.5)

## 2013-12-31 LAB — BASIC METABOLIC PANEL
Anion gap: 13 (ref 5–15)
BUN: 48 mg/dL — AB (ref 6–23)
CHLORIDE: 105 meq/L (ref 96–112)
CO2: 22 meq/L (ref 19–32)
CREATININE: 0.82 mg/dL (ref 0.50–1.10)
Calcium: 8.1 mg/dL — ABNORMAL LOW (ref 8.4–10.5)
GFR calc Af Amer: 70 mL/min — ABNORMAL LOW (ref >90)
GFR calc non Af Amer: 60 mL/min — ABNORMAL LOW (ref >90)
Glucose, Bld: 86 mg/dL (ref 70–99)
POTASSIUM: 4.1 meq/L (ref 3.7–5.3)
Sodium: 140 mEq/L (ref 137–147)

## 2013-12-31 LAB — GLUCOSE, CAPILLARY
Glucose-Capillary: 102 mg/dL — ABNORMAL HIGH (ref 70–99)
Glucose-Capillary: 90 mg/dL (ref 70–99)
Glucose-Capillary: 124 mg/dL — ABNORMAL HIGH (ref 70–99)
Glucose-Capillary: 95 mg/dL (ref 70–99)

## 2013-12-31 MED ORDER — POLYETHYLENE GLYCOL 3350 17 G PO PACK
17.0000 g | PACK | Freq: Every day | ORAL | Status: DC
  Filled 2013-12-31 (×3): qty 1

## 2013-12-31 NOTE — Clinical Documentation Improvement (Addendum)
Possible Clinical Conditions?   Acute Blood Loss Anemia  Acute on chronic blood loss anemia  Chronic blood loss anemia  Precipitous drop in Hematocrit  Other Condition  Cannot Clinically Determine   Supporting Information: ( As per notes) "Epistaxis" Diagnostics: Hgb on admit:=16.0 Current H&H=10.6 Serial H&H monitoring Medications: ferrous sulfate 324 (65 FE) MG TBEC [[id number]]        Order Details      Dose: 2 tablet Route: Oral Frequency: Daily     Thank You, Alessandra Grout, RN, BSN, CCDS, Clinical Documentation Specialist:  [phone]   [phone]=[facility] [place]- Health Information Management    Acute on Chronic blood loss anemia; patient was dehydrated and with hemoconcentration on admission; reason why after IVF's we can appreciated big drop in Hgb. Will add to progress note and discharge summary.  Thanks  Barton Dubois [phone]

## 2013-12-31 NOTE — Care Management Note (Addendum)
  Page 2 of 2   01/01/2014     12:52:06 PM CARE MANAGEMENT NOTE 01/01/2014  Patient:  [name], [name]   Account Number:  [id number]  Date Initiated:  12/31/2013  Documentation initiated by:  [facility]  Subjective/Objective Assessment:   80 yo female comes in c/o gradual onset and worsening of persistent abdominal discomfort , bloating and inability to have a BM ,with  intermittent episodes of N/V.//Home with daughter.     Action/Plan:   General surgery consultation  NG tube placement  Serial KUB  And dulcolax supp prn.// Access for [facility] services.   Anticipated DC Date:  12/31/2013   Anticipated DC Plan:  [place]  CM consult      PAC Choice  NA   Choice offered to / List presented to:          [facility] arranged  [place] - 11 Patient Refused      Status of service:  Completed, signed off Medicare Important Message given?  YES (If response is "NO", the following Medicare IM given date fields will be blank) Date Medicare IM given:  12/31/2013 Medicare IM given by:  [facility] Date Additional Medicare IM given:   Additional Medicare IM given by:    Discharge Disposition:  HOME/SELF CARE  Per UR Regulation:  Reviewed for med. necessity/level of care/duration of stay  If discussed at [place] of Stay Meetings, dates discussed:    Comments:  [name] Struss RN, BSN, MSHL, CCM  Nurse - Case Manager,  (Unit [phone]  01/01/2014 Disposition Plan Reviewed: Social:  From home with DTR. PT RECS:  None Med Review:  Eliquis - active prior to admission Home / Self Care   12/31/13 1030 [name] J. Clydene Laming, RN, BSN, [employer] [phone] I have recommended that this patient have [place] but she declines at this time. I have discussed the benefits of this service with her. The patient verbalizes understanding.

## 2013-12-31 NOTE — Progress Notes (Addendum)
[facility] PROGRESS NOTE   Assessment/Plan: Small bowel obstruction -NGT sneezed out on 12/29/13; patient endorses having no focal or episode of abdominal pain, nausea, vomiting or any other acute complaints. -Following surgery recommendations will advance diet soft diet -had BM today; will add miralax and continue dulcolax -IV fluids change to NSL -Continue supportive care  AKI (acute kidney injury): -pre-renal in etiology -Resolved with IV fluid resuscitation  Epistaxis: -Resolved after holding eliquis and using afrin spray. -Will monitor Hgb trend  Diabetes mellitus: -HbA1c pending -Continue sliding scale insulin  Atrial fibrillation -Rate control -Will continue diltiazem and eliquis  History of hypothyroidism: Will continue Synthroid  Chronic diastolic heart failure: Compensated. -Follow daily weights and strict I.'s and O.'s.  Acute on chronic blood loss anemia -secondary to epistaxis on top of chronic iron deficiency anemia -patient was initially dehydrated with ongoing poor PO intake and vomiting from SBO; making her Hgb to be falsely concentrated -no further bleeding appreciated now -Hgb 10.6; will follow trend   Hypertension: Stable and well controlled. Will continue ramipril 10 mg by mouth daily and the use of diltiazem   DVT: SCDs.   Code Status: dnr  Family Communication: bedside  Disposition Plan: admit    Consultants:  Surgery  Procedures:  none  Antibiotics:  None  HPI/Subjective: Alert, awake, oriented x3. Feeling better; had BM today and continues passing gas; no nausea or vomiting.  Objective: Filed Vitals:   12/30/13 1300 12/30/13 2107 12/31/13 0532 12/31/13 1029  BP: 110/57 116/64 129/50 113/66  Pulse: 80 72 82 102  Temp: 97.1 F (36.2 C) 97.4 F (36.3 C) 98 F (36.7 C)   TempSrc: Oral Oral Oral   Resp: 18 18 20 18   Height:      Weight:   90.901 kg (200 lb 6.4 oz)   SpO2: 98% 99% 94% 97%    Intake/Output  Summary (Last 24 hours) at 12/31/13 1348 Last data filed at 12/31/13 1000  Gross per 24 hour  Intake    680 ml  Output   1402 ml  Net   -722 ml   Filed Weights   12/29/13 0529 12/30/13 0454 12/31/13 0532  Weight: 88.9 kg (195 lb 15.8 oz) 89.721 kg (197 lb 12.8 oz) 90.901 kg (200 lb 6.4 oz)    Exam:  General: Alert, awake, oriented x3. Feeling better; had BM today and continues passing gas; no nausea or vomiting. HEENT: No bruits, no goiter.  Heart: Regular rate, no rubs or gallops Lungs: Good air movement, clear Abdomen: Soft, nontender, nondistended, positive bowel sounds.     Data Reviewed: Basic Metabolic Panel:  Recent Labs Lab 12/28/13 1200 12/29/13 0454 12/30/13 0447 12/31/13 0419  NA 144 141 142 140  K 4.5 4.9 4.4 4.1  CL 100 102 105 105  CO2 26 20 22 22   GLUCOSE 127* 166* 106* 86  BUN 24* 45* 65* 48*  CREATININE 1.10 1.51* 1.02 0.82  CALCIUM 10.0 9.3 8.4 8.1*   Liver Function Tests:  Recent Labs Lab 12/28/13 1200 12/29/13 0454  AST 32 27  ALT 22 20  ALKPHOS 90 82  BILITOT 0.7 0.7  PROT 8.2 7.8  ALBUMIN 4.4 4.2    Recent Labs Lab 12/28/13 1200  LIPASE 35   CBC:  Recent Labs Lab 12/28/13 1200 12/29/13 0454 12/31/13 0419  WBC 7.3 9.6 5.3  NEUTROABS 5.9  --   --   HGB 16.0* 15.6* 10.6*  HCT 48.0* 46.9* 32.0*  MCV 97.2 97.3 96.7  PLT 203 217 151   Cardiac Enzymes:  Recent Labs Lab 12/28/13 1545 12/28/13 2108 12/29/13 0454  TROPONINI <0.30 <0.30 <0.30   CBG:  Recent Labs Lab 12/30/13 1706 12/30/13 1749 12/30/13 2104 12/31/13 0538 12/31/13 1102  GLUCAP 65* 94 71 102* 90    Studies: Dg Abd Portable 1v  12/30/2013   CLINICAL DATA:  Recheck small-bowel obstruction  EXAM: PORTABLE ABDOMEN - 1 VIEW  COMPARISON:  12/29/2013  FINDINGS: NG tube is been removed. Gas in nondilated large and small bowel. Small amount of gas and stool in the rectum. Interval decompression of the stomach.  Upright or decubitus views not obtained today  to evaluate for air-fluid level or free air.  IMPRESSION: Nonobstructive bowel gas pattern.   Electronically Signed   By: Franchot Gallo M.D.   On: 12/30/2013 08:59    Scheduled Meds: . antiseptic oral rinse  15 mL Mouth Rinse BID  . apixaban  5 mg Oral BID  . bisacodyl  5 mg Oral BH-q7a  . bisacodyl  10 mg Rectal Daily  . diltiazem  360 mg Oral q morning - 10a  . insulin aspart  0-9 Units Subcutaneous TID AC & HS  . insulin detemir  5 Units Subcutaneous QHS  . levothyroxine  200 mcg Oral QAC breakfast  . polyethylene glycol  17 g Oral Daily  . ramipril  10 mg Oral Daily   Continuous Infusions:     Barton Dubois  [facility] Pager [phone]. If 8PM-8AM, please contact night-coverage at [email or web address], password [facility] 12/31/2013, 1:48 PM  LOS: 3 days      **Disclaimer: This note may have been dictated with voice recognition software. Similar sounding words can inadvertently be transcribed and this note may contain transcription errors which may not have been corrected upon publication of note.**

## 2013-12-31 NOTE — Progress Notes (Signed)
Physical Therapy Treatment Patient Details Name: AMBRIELLA KITT MRN: [id number] DOB: 1920-06-01 Today's Date: 12/31/2013    History of Present Illness pt presents with SBO    PT Comments    Pt able to increase amb distance today and demos improving safety with mobility.  Will continue to follow while on acute.    Follow Up Recommendations  No PT follow up;Supervision/Assistance - 24 hour     Equipment Recommendations  None recommended by PT    Recommendations for Other Services       Precautions / Restrictions Precautions Precautions: None Restrictions Weight Bearing Restrictions: No    Mobility  Bed Mobility                  Transfers Overall transfer level: Needs assistance Equipment used: Rolling walker (2 wheeled) Transfers: Sit to/from Stand Sit to Stand: Supervision         General transfer comment: cues for UE use with returning to sit and controlling descent.    Ambulation/Gait Ambulation/Gait assistance: Supervision Ambulation Distance (Feet): 110 Feet Assistive device: Rolling walker (2 wheeled) Gait Pattern/deviations: Step-through pattern;Decreased stride length;Trunk flexed     General Gait Details: pt moves slowly, but demos good use of RW.  Able to increase amb distance today.     Stairs            Wheelchair Mobility    Modified Rankin (Stroke Patients Only)       Balance Overall balance assessment: Needs assistance         Standing balance support: No upper extremity supported;During functional activity Standing balance-Leahy Scale: Fair Standing balance comment: pt able to stand and adjust gown without UE support, but can not accept balance challenges.                      Cognition Arousal/Alertness: Awake/alert Behavior During Therapy: WFL for tasks assessed/performed Overall Cognitive Status: Within Functional Limits for tasks assessed                      Exercises      General Comments         Pertinent Vitals/Pain Denied pain.      Home Living                      Prior Function            PT Goals (current goals can now be found in the care plan section) Acute Rehab PT Goals Patient Stated Goal: to be independent  Time For Goal Achievement: 01/05/14 Potential to Achieve Goals: Good Progress towards PT goals: Progressing toward goals    Frequency  Min 3X/week    PT Plan Current plan remains appropriate    Co-evaluation             End of Session Equipment Utilized During Treatment: Gait belt Activity Tolerance: Patient tolerated treatment well Patient left: in chair;with call bell/phone within reach     Time: 0953-1009 PT Time Calculation (min): 16 min  Charges:  $Gait Training: 8-22 mins                    G CodesCatarina Hartshorn, [place] 12/31/2013, 11:59 AM

## 2013-12-31 NOTE — Progress Notes (Signed)
  Subjective: Passed a lot more gas  Objective: Vital signs in last 24 hours: Temp:  [97.1 F (36.2 C)-98 F (36.7 C)] 98 F (36.7 C) (07/02 0532) Pulse Rate:  [72-90] 82 (07/02 0532) Resp:  [18-20] 20 (07/02 0532) BP: (110-142)/(50-72) 129/50 mmHg (07/02 0532) SpO2:  [94 %-99 %] 94 % (07/02 0532) Weight:  [200 lb 6.4 oz (90.901 kg)] 200 lb 6.4 oz (90.901 kg) (07/02 0532) Last BM Date: 12/29/13  Intake/Output from previous day: 07/01 0701 - 07/02 0700 In: 720 [P.O.:720] Out: 2000 [Urine:2000] Intake/Output this shift:    General appearance: alert and cooperative Resp: clear to auscultation bilaterally GI: soft, NT, active BS  Lab Results:   Recent Labs  12/29/13 0454 12/31/13 0419  WBC 9.6 5.3  HGB 15.6* 10.6*  HCT 46.9* 32.0*  PLT 217 151   BMET  Recent Labs  12/30/13 0447 12/31/13 0419  NA 142 140  K 4.4 4.1  CL 105 105  CO2 22 22  GLUCOSE 106* 86  BUN 65* 48*  CREATININE 1.02 0.82  CALCIUM 8.4 8.1*   PT/INR  Recent Labs  12/29/13 0454  LABPROT 16.2*  INR 1.30   ABG No results found for this basename: PHART, PCO2, PO2, HCO3,  in the last 72 hours  Studies/Results: Dg Abd Portable 1v  12/30/2013   CLINICAL DATA:  Recheck small-bowel obstruction  EXAM: PORTABLE ABDOMEN - 1 VIEW  COMPARISON:  12/29/2013  FINDINGS: NG tube is been removed. Gas in nondilated large and small bowel. Small amount of gas and stool in the rectum. Interval decompression of the stomach.  Upright or decubitus views not obtained today to evaluate for air-fluid level or free air.  IMPRESSION: Nonobstructive bowel gas pattern.   Electronically Signed   By: Franchot Gallo M.D.   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

## 2014-01-01 ENCOUNTER — Inpatient Hospital Stay (HOSPITAL_COMMUNITY): Payer: Medicare Other

## 2014-01-01 DIAGNOSIS — I509 Heart failure, unspecified: Secondary | ICD-10-CM

## 2014-01-01 LAB — BASIC METABOLIC PANEL
Anion gap: 17 — ABNORMAL HIGH (ref 5–15)
BUN: 30 mg/dL — AB (ref 6–23)
CALCIUM: 9.2 mg/dL (ref 8.4–10.5)
CO2: 20 mEq/L (ref 19–32)
Chloride: 103 mEq/L (ref 96–112)
Creatinine, Ser: 0.83 mg/dL (ref 0.50–1.10)
GFR calc Af Amer: 69 mL/min — ABNORMAL LOW (ref >90)
GFR, EST NON AFRICAN AMERICAN: 59 mL/min — AB (ref >90)
GLUCOSE: 134 mg/dL — AB (ref 70–99)
Potassium: 4.6 mEq/L (ref 3.7–5.3)
Sodium: 140 mEq/L (ref 137–147)

## 2014-01-01 LAB — GLUCOSE, CAPILLARY
GLUCOSE-CAPILLARY: 100 mg/dL — AB (ref 70–99)
GLUCOSE-CAPILLARY: 74 mg/dL (ref 70–99)
Glucose-Capillary: 148 mg/dL — ABNORMAL HIGH (ref 70–99)
Glucose-Capillary: 110 mg/dL — ABNORMAL HIGH (ref 70–99)
Glucose-Capillary: 108 mg/dL — ABNORMAL HIGH (ref 70–99)
Glucose-Capillary: 98 mg/dL (ref 70–99)

## 2014-01-01 LAB — HEMOGLOBIN A1C
HEMOGLOBIN A1C: 5.7 % — AB (ref <5.7)
Mean Plasma Glucose: 117 mg/dL — ABNORMAL HIGH (ref <117)

## 2014-01-01 MED ORDER — INSULIN ASPART 100 UNIT/ML ~~LOC~~ SOLN
0.0000 [IU] | Freq: Three times a day (TID) | SUBCUTANEOUS | Status: DC
  Administered 2014-01-02 (×2): 1 [IU] via SUBCUTANEOUS

## 2014-01-01 NOTE — Progress Notes (Signed)
Paged MD to notify of patients condition===pt c/o Nausea & vomiting, had some emesis  Received Call back from On call MD, Aileen Fass, orders received to continue to monitor patient. Pt resting in Bed at this time, verbalized some slight discomfort, RN will continue to monitor.

## 2014-01-01 NOTE — Progress Notes (Signed)
Patient ID: [name], female   DOB: [birth year], 80 y.o.   MRN: [id number]  General Surgery - [facility] - Progress Note  HD# 5  Subjective: Patient up in chair.  Had emesis and nausea overnight.  Now comfortable without complaint.  Wants to eat.  Objective: Vital signs in last 24 hours: Temp:  [97.4 F (36.3 C)-98.3 F (36.8 C)] 98.3 F (36.8 C) (07/03 0340) Pulse Rate:  [70-95] 86 (07/03 1103) Resp:  [18-20] 18 (07/03 0340) BP: (105-138)/(60-73) 129/71 mmHg (07/03 1103) SpO2:  [94 %-100 %] 94 % (07/03 0340) Weight:  [198 lb 13.7 oz (90.2 kg)] 198 lb 13.7 oz (90.2 kg) (07/03 0340) Last BM Date: 12/31/13  Intake/Output from previous day: 07/02 0701 - 07/03 0700 In: 1140 [P.O.:1140] Out: 402 [Urine:401; Stool:1]  Exam: HEENT - clear, not icteric Neck - soft Chest - clear bilaterally Cor - RRR, no murmur Abd - protuberant, moderate distension; well-healed old incisions; BS present; minimal tenderness Ext - no significant edema Neuro - grossly intact, no focal deficits  Lab Results:   Recent Labs  12/31/13 0419  WBC 5.3  HGB 10.6*  HCT 32.0*  PLT 151     Recent Labs  12/31/13 0419 01/01/14 0415  NA 140 140  K 4.1 4.6  CL 105 103  CO2 22 20  GLUCOSE 86 134*  BUN 48* 30*  CREATININE 0.82 0.83  CALCIUM 8.1* 9.2    Studies/Results: No results found.  Assessment / Plan: 1.  Recurrent nausea and emesis, ? Recurrent SBO  Will check AXR 2-view now  Clear liquid diet as tolerated  OOB, ambulate  Will follow with you  Earnstine Regal, MD, [facility] Office: [phone]  01/01/2014

## 2014-01-01 NOTE — Progress Notes (Signed)
UR completed [place] Mccutchen K. Dietra Stokely, RN, BSN, [place], CCM  01/01/2014 12:47 PM

## 2014-01-01 NOTE — Progress Notes (Signed)
Report given to receiving RN. Patient sitting in recliner, watching TV. No verbal complaints. No signs or symptoms of distress or discomfort noted.

## 2014-01-01 NOTE — Progress Notes (Signed)
[facility] PROGRESS NOTE   Assessment/Plan: Small bowel obstruction -NGT sneezed out on 12/29/13; patient endorses having nausea, vomiting and abdominal discomfort throughout the night and early this morning. -Diet will be changed back to clear liquids -Will check abdominal x-ray (2 views) -Follow clinical response, there is concerns of recurrent SBO. -Continue supportive care  AKI (acute kidney injury): -pre-renal in etiology -Resolved with IV fluid resuscitation  Epistaxis: -Resolved after holding eliquis for two days and using afrin spray. -Will monitor Hgb trend -Monitor for further bleeding -Patient has been back on eliquis  Diabetes mellitus: -HbA1c pending -Continue sliding scale insulin  Atrial fibrillation -Rate control -Will continue diltiazem and eliquis  History of hypothyroidism: Will continue Synthroid  Chronic diastolic heart failure: Compensated. -Follow daily weights and strict I.'s and O.'s.  Acute on chronic blood loss anemia -secondary to epistaxis on top of chronic iron deficiency anemia -patient was initially dehydrated with ongoing poor PO intake and vomiting from SBO; making her Hgb to be falsely concentrated -no further bleeding appreciated now -Hgb 10.6; will follow trend -CBC in a.m.   Hypertension: Stable and well controlled. Will continue ramipril 10 mg by mouth daily and diltiazem   DVT: SCDs.   Code Status: DNR  Family Communication: bedside  Disposition Plan: admit    Consultants:  Surgery  Procedures:  none  Antibiotics:  None  HPI/Subjective: Patient complaining of nausea/vomiting throughout the night and early this morning; also with abdominal discomfort. No fever. No signs of hematemesis  Objective: Filed Vitals:   12/31/13 2104 01/01/14 0220 01/01/14 0340 01/01/14 1103  BP: 109/60 105/60 138/73 129/71  Pulse: 79 70 74 86  Temp: 97.6 F (36.4 C) 97.4 F (36.3 C) 98.3 F (36.8 C)   TempSrc: Oral  Oral Oral   Resp: 18 20 18    Height:      Weight:   90.2 kg (198 lb 13.7 oz)   SpO2: 100% 96% 94%     Intake/Output Summary (Last 24 hours) at 01/01/14 1435 Last data filed at 01/01/14 0900  Gross per 24 hour  Intake    700 ml  Output    400 ml  Net    300 ml   Filed Weights   12/30/13 0454 12/31/13 0532 01/01/14 0340  Weight: 89.721 kg (197 lb 12.8 oz) 90.901 kg (200 lb 6.4 oz) 90.2 kg (198 lb 13.7 oz)    Exam:  General: Alert, awake, oriented x2. Feeling bad, complaining of abdominal discomfort, nausea and emesis episodes overnight and early this morning.  HEENT: No bruits, no goiter.  Heart: Regular rate, no rubs or gallops Lungs: Good air movement, clear Abdomen: Soft, nondistended, positive bowel sounds; slight diffuse tenderness with deep palpation    Data Reviewed: Basic Metabolic Panel:  Recent Labs Lab 12/28/13 1200 12/29/13 0454 12/30/13 0447 12/31/13 0419 01/01/14 0415  NA 144 141 142 140 140  K 4.5 4.9 4.4 4.1 4.6  CL 100 102 105 105 103  CO2 26 20 22 22 20   GLUCOSE 127* 166* 106* 86 134*  BUN 24* 45* 65* 48* 30*  CREATININE 1.10 1.51* 1.02 0.82 0.83  CALCIUM 10.0 9.3 8.4 8.1* 9.2   Liver Function Tests:  Recent Labs Lab 12/28/13 1200 12/29/13 0454  AST 32 27  ALT 22 20  ALKPHOS 90 82  BILITOT 0.7 0.7  PROT 8.2 7.8  ALBUMIN 4.4 4.2    Recent Labs Lab 12/28/13 1200  LIPASE 35   CBC:  Recent Labs Lab 12/28/13  1200 12/29/13 0454 12/31/13 0419  WBC 7.3 9.6 5.3  NEUTROABS 5.9  --   --   HGB 16.0* 15.6* 10.6*  HCT 48.0* 46.9* 32.0*  MCV 97.2 97.3 96.7  PLT 203 217 151   Cardiac Enzymes:  Recent Labs Lab 12/28/13 1545 12/28/13 2108 12/29/13 0454  TROPONINI <0.30 <0.30 <0.30   CBG:  Recent Labs Lab 12/31/13 2121 01/01/14 0224 01/01/14 0620 01/01/14 0839 01/01/14 1048  GLUCAP 95 148* 110* 108* 100*    Studies: Dg Abd 2 Views  01/01/2014   CLINICAL DATA:  nausea and emesis, rule out SBO  EXAM: ABDOMEN - 2 VIEW   COMPARISON:  Frontal in abdomen 12/30/2013  FINDINGS: Air within nondilated loops of large small bowel. No acute osseous abnormalities. Degenerative changes within the hips.  IMPRESSION: Nonobstructive bowel gas pattern.   Electronically Signed   By: Margaree Mackintosh M.D.   On: 01/01/2014 12:32    Scheduled Meds: . antiseptic oral rinse  15 mL Mouth Rinse BID  . apixaban  5 mg Oral BID  . bisacodyl  5 mg Oral BH-q7a  . bisacodyl  10 mg Rectal Daily  . diltiazem  360 mg Oral q morning - 10a  . insulin aspart  0-9 Units Subcutaneous TID AC & HS  . insulin detemir  5 Units Subcutaneous QHS  . levothyroxine  200 mcg Oral QAC breakfast  . polyethylene glycol  17 g Oral Daily  . ramipril  10 mg Oral Daily   Continuous Infusions:     Barton Dubois  [facility] Pager [phone]. If 8PM-8AM, please contact night-coverage at [email or web address], password [facility] 01/01/2014, 2:35 PM  LOS: 4 days      **Disclaimer: This note may have been dictated with voice recognition software. Similar sounding words can inadvertently be transcribed and this note may contain transcription errors which may not have been corrected upon publication of note.**

## 2014-01-02 LAB — GLUCOSE, CAPILLARY
GLUCOSE-CAPILLARY: 90 mg/dL (ref 70–99)
GLUCOSE-CAPILLARY: 132 mg/dL — AB (ref 70–99)
Glucose-Capillary: 141 mg/dL — ABNORMAL HIGH (ref 70–99)
Glucose-Capillary: 85 mg/dL (ref 70–99)

## 2014-01-02 LAB — CBC
HCT: 31.3 % — ABNORMAL LOW (ref 36.0–46.0)
Hemoglobin: 10.4 g/dL — ABNORMAL LOW (ref 12.0–15.0)
MCH: 31.8 pg (ref 26.0–34.0)
MCHC: 33.2 g/dL (ref 30.0–36.0)
MCV: 95.7 fL (ref 78.0–100.0)
Platelets: 169 10*3/uL (ref 150–400)
RBC: 3.27 MIL/uL — ABNORMAL LOW (ref 3.87–5.11)
RDW: 16.6 % — AB (ref 11.5–15.5)
WBC: 4.9 10*3/uL (ref 4.0–10.5)

## 2014-01-02 LAB — BASIC METABOLIC PANEL
Anion gap: 13 (ref 5–15)
BUN: 16 mg/dL (ref 6–23)
CALCIUM: 8.4 mg/dL (ref 8.4–10.5)
CO2: 22 mEq/L (ref 19–32)
Chloride: 108 mEq/L (ref 96–112)
Creatinine, Ser: 0.72 mg/dL (ref 0.50–1.10)
GFR calc Af Amer: 84 mL/min — ABNORMAL LOW (ref >90)
GFR, EST NON AFRICAN AMERICAN: 72 mL/min — AB (ref >90)
Glucose, Bld: 92 mg/dL (ref 70–99)
POTASSIUM: 4.3 meq/L (ref 3.7–5.3)
SODIUM: 143 meq/L (ref 137–147)

## 2014-01-02 NOTE — Progress Notes (Signed)
Report given to receiving RN. No signs of distress noted.

## 2014-01-02 NOTE — Progress Notes (Signed)
Patient ID: [name], female   DOB: 01-09-1920, 80 y.o.   MRN: [id number]   LOS: 5 days   Subjective: Feels loads better this morning, had two BM's last night. Desperately wants to eat.   Objective: Vital signs in last 24 hours: Temp:  [98 F (36.7 C)-98.8 F (37.1 C)] 98.8 F (37.1 C) (07/04 0628) Pulse Rate:  [71-86] 76 (07/04 0628) Resp:  [16-20] 16 (07/04 0628) BP: (115-140)/(69-80) 140/80 mmHg (07/04 0628) SpO2:  [93 %-97 %] 93 % (07/04 0628) Weight:  [201 lb (91.173 kg)] 201 lb (91.173 kg) (07/04 0628) Last BM Date: 01/01/14   Laboratory  CBC  Recent Labs  12/31/13 0419 01/02/14 0413  WBC 5.3 4.9  HGB 10.6* 10.4*  HCT 32.0* 31.3*  PLT 151 169   BMET  Recent Labs  01/01/14 0415 01/02/14 0413  NA 140 143  K 4.6 4.3  CL 103 108  CO2 20 22  GLUCOSE 134* 92  BUN 30* 16  CREATININE 0.83 0.72  CALCIUM 9.2 8.4    Radiology Results ABDOMEN - 2 VIEW  COMPARISON: Frontal in abdomen 12/30/2013  FINDINGS:  Air within nondilated loops of large small bowel. No acute osseous  abnormalities. Degenerative changes within the hips.  IMPRESSION:  Nonobstructive bowel gas pattern.  Electronically Signed  By: Margaree Mackintosh M.D.  On: 01/01/2014 12:32   Physical Exam General appearance: alert and no distress Resp: wheezes bilaterally Cardio: irregularly irregular rhythm GI: normal findings: bowel sounds normal and soft, non-tender   Assessment/Plan: SBO -- Seems improved despite x-ray yesterday. Will give fulls for lunch and advance for dinner if she tolerates that. Repeat abd x-ray in am. Multiple medical problems -- per primary service    Lisette Abu, PA-C Pager: [phone] 01/02/2014

## 2014-01-02 NOTE — Progress Notes (Signed)
[facility] PROGRESS NOTE   Assessment/Plan: Small bowel obstruction -Patient denies any further episodes of abdominal pain, nausea, vomiting -Abdominal x-ray and without signs of SBO. -Will try a full liquid diet and if tolerated advance to mechanical soft -Continue supportive care -He remained stable will discharge home in a.m.   AKI (acute kidney injury): -pre-renal in etiology -Resolved with IV fluid resuscitation  Epistaxis: -Resolved after holding eliquis for two days and using afrin spray. -Will monitor Hgb trend -Monitor for further bleeding -Patient has been back on eliquis  Diabetes mellitus: -HbA1c pending -Continue sliding scale insulin  Atrial fibrillation -Rate control -Will continue diltiazem and eliquis  History of hypothyroidism: Will continue Synthroid  Chronic diastolic heart failure: Compensated. -Follow daily weights and strict I.'s and O.'s.  Acute on chronic blood loss anemia -secondary to epistaxis on top of chronic iron deficiency anemia -patient was initially dehydrated with ongoing poor PO intake and vomiting from SBO; making her Hgb to be falsely concentrated -no further bleeding appreciated now -Hgb 10.6; will follow trend -CBC in a.m.   Hypertension: Stable and well controlled. Will continue ramipril 10 mg by mouth daily and diltiazem   DVT: SCDs.   Code Status: DNR  Family Communication: bedside  Disposition Plan: admit    Consultants:  Surgery  Procedures:  none  Antibiotics:  None  HPI/Subjective: Patient denies chest pain or shortness of breath. Reports no further abdominal pain, no nausea or vomiting. Patient continue moving her bowels (currently diarrhea).  Objective: Filed Vitals:   01/01/14 2306 01/02/14 0628 01/02/14 0951 01/02/14 1300  BP: 115/71 140/80 128/67 117/70  Pulse: 71 76 80 76  Temp: 98 F (36.7 C) 98.8 F (37.1 C) 98.7 F (37.1 C) 98.4 F (36.9 C)  TempSrc: Oral Oral Oral Oral    Resp: 20 16 20 20   Height:      Weight:  91.173 kg (201 lb)    SpO2: 97% 93% 100% 96%    Intake/Output Summary (Last 24 hours) at 01/02/14 1521 Last data filed at 01/02/14 1300  Gross per 24 hour  Intake    960 ml  Output   1776 ml  Net   -816 ml   Filed Weights   12/31/13 0532 01/01/14 0340 01/02/14 0628  Weight: 90.901 kg (200 lb 6.4 oz) 90.2 kg (198 lb 13.7 oz) 91.173 kg (201 lb)    Exam:  General: Alert, awake, oriented x3; in no abdominal pain; patient has continue moving her bowels and is not complaining of any nausea vomiting at this moment.  HEENT: No bruits, no goiter.  Heart: Regular rate, no rubs or gallops Lungs: Good air movement, clear Abdomen: Soft, nondistended, positive bowel sounds; no tenderness    Data Reviewed: Basic Metabolic Panel:  Recent Labs Lab 12/29/13 0454 12/30/13 0447 12/31/13 0419 01/01/14 0415 01/02/14 0413  NA 141 142 140 140 143  K 4.9 4.4 4.1 4.6 4.3  CL 102 105 105 103 108  CO2 20 22 22 20 22   GLUCOSE 166* 106* 86 134* 92  BUN 45* 65* 48* 30* 16  CREATININE 1.51* 1.02 0.82 0.83 0.72  CALCIUM 9.3 8.4 8.1* 9.2 8.4   Liver Function Tests:  Recent Labs Lab 12/28/13 1200 12/29/13 0454  AST 32 27  ALT 22 20  ALKPHOS 90 82  BILITOT 0.7 0.7  PROT 8.2 7.8  ALBUMIN 4.4 4.2    Recent Labs Lab 12/28/13 1200  LIPASE 35   CBC:  Recent Labs Lab 12/28/13 1200  12/29/13 0454 12/31/13 0419 01/02/14 0413  WBC 7.3 9.6 5.3 4.9  NEUTROABS 5.9  --   --   --   HGB 16.0* 15.6* 10.6* 10.4*  HCT 48.0* 46.9* 32.0* 31.3*  MCV 97.2 97.3 96.7 95.7  PLT 203 217 151 169   Cardiac Enzymes:  Recent Labs Lab 12/28/13 1545 12/28/13 2108 12/29/13 0454  TROPONINI <0.30 <0.30 <0.30   CBG:  Recent Labs Lab 01/01/14 1048 01/01/14 1638 01/01/14 2245 01/02/14 0550 01/02/14 1102  GLUCAP 100* 74 98 90 132*    Studies: Dg Abd 2 Views  01/01/2014   CLINICAL DATA:  nausea and emesis, rule out SBO  EXAM: ABDOMEN - 2 VIEW   COMPARISON:  Frontal in abdomen 12/30/2013  FINDINGS: Air within nondilated loops of large small bowel. No acute osseous abnormalities. Degenerative changes within the hips.  IMPRESSION: Nonobstructive bowel gas pattern.   Electronically Signed   By: Margaree Mackintosh M.D.   On: 01/01/2014 12:32    Scheduled Meds: . antiseptic oral rinse  15 mL Mouth Rinse BID  . apixaban  5 mg Oral BID  . bisacodyl  5 mg Oral BH-q7a  . bisacodyl  10 mg Rectal Daily  . diltiazem  360 mg Oral q morning - 10a  . insulin aspart  0-9 Units Subcutaneous TID AC & HS  . insulin detemir  5 Units Subcutaneous QHS  . levothyroxine  200 mcg Oral QAC breakfast  . polyethylene glycol  17 g Oral Daily  . ramipril  10 mg Oral Daily   Continuous Infusions:   Time: < 30 minutes  Barton Dubois  [facility] Pager [phone]. If 8PM-8AM, please contact night-coverage at [email or web address], password [facility] 01/02/2014, 3:21 PM  LOS: 5 days    **Disclaimer: This note may have been dictated with voice recognition software. Similar sounding words can inadvertently be transcribed and this note may contain transcription errors which may not have been corrected upon publication of note.**

## 2014-01-02 NOTE — Progress Notes (Signed)
Seen, agree with above.  Full liquids this am, probably reg diet at dinner.  Resolving SBO

## 2014-01-03 ENCOUNTER — Inpatient Hospital Stay (HOSPITAL_COMMUNITY): Payer: Medicare Other

## 2014-01-03 DIAGNOSIS — C189 Malignant neoplasm of colon, unspecified: Secondary | ICD-10-CM

## 2014-01-03 LAB — GLUCOSE, CAPILLARY
GLUCOSE-CAPILLARY: 88 mg/dL (ref 70–99)
Glucose-Capillary: 97 mg/dL (ref 70–99)

## 2014-01-03 MED ORDER — BISACODYL 10 MG RE SUPP: 10.0000 mg | Freq: Every day | RECTAL | Status: DC | PRN

## 2014-01-03 MED ORDER — ONDANSETRON 8 MG PO TBDP: 8.0000 mg | ORAL_TABLET | Freq: Three times a day (TID) | ORAL | Status: DC | PRN

## 2014-01-03 NOTE — Progress Notes (Signed)
SBD appears to have resolved.  Still a bit distended, but nonobstructive pattern on X-ray today, and patient had four bowel movements yesterday.  Also, Sitting up eating regular diet this AM.  Okay from  Surgical standpoint to DC at anytime.  Kathryne Eriksson. Dahlia Bailiff, MD, [place] [phone] [facility]

## 2014-01-03 NOTE — Discharge Summary (Signed)
Physician Discharge Summary  [name] ZOX:[id number] DOB: [birth year] DOA: 12/28/2013  PCP: [name], MD  Admit date: 12/28/2013 Discharge date: 01/03/2014  Time spent: >30 minutes  Recommendations for Outpatient Follow-up:  1. BMEt to follow electrolytes and renal function 2. CBC to follow Hgb trend 3. Close follow up to CBG's and diabetes. A1C 5.7   Discharge Diagnoses:  Principal Problem:   Small bowel obstruction Active Problems:   Chronic diastolic heart failure   Atrial fibrillation   Diabetes mellitus   AKI (acute kidney injury)   Discharge Condition: stable and improved.   Diet recommendation: low sodium and low carb diet  Filed Weights   01/01/14 0340 01/02/14 0628 01/03/14 0540  Weight: 90.2 kg (198 lb 13.7 oz) 91.173 kg (201 lb) 92.352 kg (203 lb 9.6 oz)    History of present illness:  80 yo white female comes in c/o gradual onset and worsening of persistent abdominal discomfort , bloating and inability to have a BM ,with intermittent episodes of N/V that began 2 to 3 days ago.Last BM was 11:30 am yesterday after taking dulcolax supp . ." Describes her symptoms as "like when I get a bowel obstruction." Pt has been unable to tolerate PO food or fluids due to her symptoms. Denies diarrhea. She also describes intermittent chest pain for last 2 weeks associated with some dyspnea.  She has multiple medical problems and multiple abdominal cancers including bladder, cervical, and ovarian. She has also had breast cancer and underwent several operations for all of these malignancies. 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 Because of these multiple surgeries, she states she has had 13 prior bowel obstructions, but said they always get better without any surgeries and just conservative management .    Hospital Course:  Small bowel obstruction  -Patient denies any further  episodes of abdominal pain, nausea, vomiting  -patient to continue use of dulcolax -instructed to follow low residue soft diet -He remained stable will discharge home and she will follow with PCP in 1 week   AKI (acute kidney injury):  -pre-renal in etiology  -Resolved with IV fluid resuscitation   Epistaxis:  -Resolved after holding eliquis for two days and using afrin spray.  -Hgb remains stable -Monitor for further bleeding  -Patient back on eliquis at discharge  Diabetes mellitus:  -HbA1c 5.7 -Continue low carbohydrates diet and close follow up with PCP  Atrial fibrillation  -Rate control  -Will continue diltiazem and eliquis   History of hypothyroidism: Will continue Synthroid   Chronic diastolic heart failure: Compensated.  -Follow daily weights and low sodium diet -continue lasix 40mg  BID  Acute on chronic blood loss anemia  -secondary to epistaxis on top of chronic iron deficiency anemia  -patient was initially dehydrated with ongoing poor PO intake and vomiting from SBO; making her Hgb to be falsely concentrated on admission -no further bleeding appreciated  -Hgb 10.6 at discharge -will continue ferrous supplementation  -CBC during follow visit  Hypertension: Stable and well controlled. Will continue ramipril 10 mg by mouth daily and diltiazem -advise to follow low sodium/heart healthy diet  Procedures:  See below for x-ray reports   Consultations:  Surgery   Discharge Exam: Filed Vitals:   01/03/14 0900  BP: 113/54  Pulse: 71  Temp: 97.3 F (36.3 C)  Resp: 20   General: Alert, awake, oriented x3; no abdominal pain; patient has continue moving her bowels and  is not complaining of any nausea vomiting at this moment.  HEENT: No bruits, no goiter.  Heart: Regular rate, no rubs or gallops  Lungs: Good air movement, clear  Abdomen: Soft, nondistended, positive bowel sounds; no tenderness   Discharge Instructions You were cared for by a hospitalist  during your hospital stay. If you have any questions about your discharge medications or the care you received while you were in the hospital after you are discharged, you can call the unit and asked to speak with the hospitalist on call if the hospitalist that took care of you is not available. Once you are discharged, your primary care physician will handle any further medical issues. Please note that NO REFILLS for any discharge medications will be authorized once you are discharged, as it is imperative that you return to your primary care physician (or establish a relationship with a primary care physician if you do not have one) for your aftercare needs so that they can reassess your need for medications and monitor your lab values.  Discharge Instructions   Diet - low sodium heart healthy    Complete by:  As directed      Discharge instructions    Complete by:  As directed   Ok to use Afrin spray (over the counter medication) for nasal congestion or nasal bleed  Follow low sodium diet (less than 2 grams daily) Check your weight on daily basis Low residue diet and use your dulcolax to maintain good bowel movements Follow with PCP in 1 week            Medication List         apixaban 5 MG Tabs tablet  Commonly known as:  ELIQUIS  Take 1 tablet (5 mg total) by mouth 2 (two) times daily.     bisacodyl 10 MG suppository  Commonly known as:  DULCOLAX  Place 1 suppository (10 mg total) rectally daily as needed for moderate constipation or severe constipation.     bisacodyl 5 MG EC tablet  Commonly known as:  DULCOLAX  Take 5 mg by mouth every morning.     CARDIZEM CD 360 MG 24 hr capsule  Generic drug:  diltiazem  Take 360 mg by mouth every morning.     ferrous sulfate 324 (65 FE) MG Tbec  Take 2 tablets by mouth daily.     furosemide 40 MG tablet  Commonly known as:  LASIX  Take 40 mg by mouth daily. Take 2 tabs daily     levothyroxine 200 MCG tablet  Commonly known as:   SYNTHROID, LEVOTHROID  Take 200 mcg by mouth daily before breakfast.     NON FORMULARY  Potchlor SR 10 meq capsules. Take one capsule once a day     ondansetron 8 MG disintegrating tablet  Commonly known as:  ZOFRAN ODT  Take 1 tablet (8 mg total) by mouth every 8 (eight) hours as needed for nausea or vomiting.     ramipril 10 MG capsule  Commonly known as:  ALTACE  Take 1 capsule (10 mg total) by mouth daily.       Allergies  Allergen Reactions  . Codeine     unknown  . Morphine And Related     sick  . Statins     sick  . Zetia [Ezetimibe] Other (See Comments)    Side effect to strong        Follow-up Information   Follow up with [name], MD. Schedule  an appointment as soon as possible for a visit in 1 week.   Specialty:  Family Medicine   Contact information:   [place] [address] [phone]       The results of significant diagnostics from this hospitalization (including imaging, microbiology, ancillary and laboratory) are listed below for reference.    Significant Diagnostic Studies: Dg Abd 2 Views  01/03/2014   CLINICAL DATA:  Follow-up of small bowel obstruction status post excision of large benign ovarian mass  EXAM: ABDOMEN - 2 VIEW  COMPARISON:  Abdominal series of January 01, 2014  FINDINGS: The bowel gas pattern is more normal today. There do remain loops of mildly distended gas-filled small bowel but there is gas in the colon and rectum. No air-fluid levels are demonstrated. There is no free extraluminal gas. The bones are diffusely osteopenic. There is degenerative change of the right hip.  IMPRESSION: Findings are consistent with improving small bowel obstruction.   Electronically Signed   By: David  [place]   On: 01/03/2014 07:35   Dg Abd 2 Views  01/01/2014   CLINICAL DATA:  nausea and emesis, rule out SBO  EXAM: ABDOMEN - 2 VIEW  COMPARISON:  Frontal in abdomen 12/30/2013  FINDINGS: Air within nondilated loops of large small  bowel. No acute osseous abnormalities. Degenerative changes within the hips.  IMPRESSION: Nonobstructive bowel gas pattern.   Electronically Signed   By: Margaree Mackintosh M.D.   On: 01/01/2014 12:32   Dg Abd Acute W/chest  12/28/2013   CLINICAL DATA:  Abdominal pain  EXAM: ACUTE ABDOMEN SERIES (ABDOMEN 2 VIEW & CHEST 1 VIEW)  COMPARISON:  PA and lateral chest x-ray of December 31, 2012  FINDINGS: The patient has undergone previous left mastectomy. The lungs are well-expanded. There is increased density in the right infrahilar region which is more conspicuous than on previous studies but which is not entirely new. The cardiac silhouette remains enlarged. The pulmonary vascularity is not engorged. There is no pleural effusion. There are degenerative changes of both shoulders.  There is mild distention of the stomach with gas and fluid. There is a loop of mildly distended small bowel projecting over the right iliac crest. A few small bowel air-fluid levels are demonstrated elsewhere. There is a normal stool and gas pattern within the sigmoid and rectum. No free extraluminal gas collections are demonstrated. There are degenerative changes of the lumbar spine, the SI joints, and the right hip.  IMPRESSION: 1. There is an ileus or partial distal small bowel obstruction. There is no evidence of perforation. 2. Increased density in the right infrahilar region may reflect lymphadenopathy or parenchymal consolidation as might be seen with pneumonia. This merits follow-up chest CT scanning when the patient can tolerate the procedure.   Electronically Signed   By: David  [place]   On: 12/28/2013 13:37   Dg Abd Portable 1v  12/30/2013   CLINICAL DATA:  Recheck small-bowel obstruction  EXAM: PORTABLE ABDOMEN - 1 VIEW  COMPARISON:  12/29/2013  FINDINGS: NG tube is been removed. Gas in nondilated large and small bowel. Small amount of gas and stool in the rectum. Interval decompression of the stomach.  Upright or decubitus views not  obtained today to evaluate for air-fluid level or free air.  IMPRESSION: Nonobstructive bowel gas pattern.   Electronically Signed   By: Franchot Gallo M.D.   On: 12/30/2013 08:59   Dg Abd Portable 2v  12/29/2013   CLINICAL DATA:  Abdominal pain  EXAM: PORTABLE ABDOMEN - 2 VIEW  COMPARISON:  Acute abdominal series dated December 28, 2013  FINDINGS: A supine and left-side-down decubitus film are reviewed. An NG tube is in place with the tip and proximal port in the pyloric region. There is mild gaseous distention of the stomach. No free extraluminal gas collections are demonstrated. A few small bowel air-fluid levels are noted on the decubitus film. There is a small amount of gas within bowel in the pelvis. No abnormal soft tissue calcifications are demonstrated. Limited evaluation of the bony structures reveals no acute abnormality. The lung bases are clear where visualized.  IMPRESSION: The bowel gas pattern is consistent with a mild ileus or partial distal small bowel obstruction. On the upright film there has been some improvement in the appearance of the small bowel. A moderate amount of gas within the stomach may reflect the NG suction being turned off. There is no evidence of perforation.   Electronically Signed   By: David  [place]   On: 12/29/2013 08:06   Labs: Basic Metabolic Panel:  Recent Labs Lab 12/29/13 0454 12/30/13 0447 12/31/13 0419 01/01/14 0415 01/02/14 0413  NA 141 142 140 140 143  K 4.9 4.4 4.1 4.6 4.3  CL 102 105 105 103 108  CO2 20 22 22 20 22   GLUCOSE 166* 106* 86 134* 92  BUN 45* 65* 48* 30* 16  CREATININE 1.51* 1.02 0.82 0.83 0.72  CALCIUM 9.3 8.4 8.1* 9.2 8.4   Liver Function Tests:  Recent Labs Lab 12/28/13 1200 12/29/13 0454  AST 32 27  ALT 22 20  ALKPHOS 90 82  BILITOT 0.7 0.7  PROT 8.2 7.8  ALBUMIN 4.4 4.2    Recent Labs Lab 12/28/13 1200  LIPASE 35   CBC:  Recent Labs Lab 12/28/13 1200 12/29/13 0454 12/31/13 0419 01/02/14 0413  WBC 7.3 9.6  5.3 4.9  NEUTROABS 5.9  --   --   --   HGB 16.0* 15.6* 10.6* 10.4*  HCT 48.0* 46.9* 32.0* 31.3*  MCV 97.2 97.3 96.7 95.7  PLT 203 217 151 169   Cardiac Enzymes:  Recent Labs Lab 12/28/13 1545 12/28/13 2108 12/29/13 0454  TROPONINI <0.30 <0.30 <0.30   CBG:  Recent Labs Lab 01/02/14 1102 01/02/14 1607 01/02/14 2119 01/03/14 0541 01/03/14 1136  GLUCAP 132* 141* 85 88 97    Signed:  Barton Dubois  [facility] 01/03/2014, 1:10 PM

## 2014-01-04 ENCOUNTER — Emergency Department (HOSPITAL_COMMUNITY)
Admission: EM | Admit: 2014-01-04 | Discharge: 2014-01-05 | Disposition: A | Discharge status: Home / Self Care | Payer: Medicare Other | Attending: Emergency Medicine | Admitting: Emergency Medicine

## 2014-01-04 ENCOUNTER — Ambulatory Visit: Payer: Medicare Other | Admitting: Podiatry

## 2014-01-04 ENCOUNTER — Encounter (HOSPITAL_COMMUNITY): Payer: Self-pay | Admitting: Emergency Medicine

## 2014-01-04 DIAGNOSIS — E119 Type 2 diabetes mellitus without complications: Secondary | ICD-10-CM | POA: Insufficient documentation

## 2014-01-04 DIAGNOSIS — I10 Essential (primary) hypertension: Secondary | ICD-10-CM | POA: Diagnosis not present

## 2014-01-04 DIAGNOSIS — Z87891 Personal history of nicotine dependence: Secondary | ICD-10-CM | POA: Insufficient documentation

## 2014-01-04 DIAGNOSIS — Z8669 Personal history of other diseases of the nervous system and sense organs: Secondary | ICD-10-CM | POA: Diagnosis not present

## 2014-01-04 DIAGNOSIS — Z8541 Personal history of malignant neoplasm of cervix uteri: Secondary | ICD-10-CM | POA: Diagnosis not present

## 2014-01-04 DIAGNOSIS — Z8551 Personal history of malignant neoplasm of bladder: Secondary | ICD-10-CM | POA: Insufficient documentation

## 2014-01-04 DIAGNOSIS — Z860100 Personal history of colon polyps, unspecified: Secondary | ICD-10-CM | POA: Insufficient documentation

## 2014-01-04 DIAGNOSIS — Z85038 Personal history of other malignant neoplasm of large intestine: Secondary | ICD-10-CM | POA: Diagnosis not present

## 2014-01-04 DIAGNOSIS — Z7902 Long term (current) use of antithrombotics/antiplatelets: Secondary | ICD-10-CM | POA: Diagnosis not present

## 2014-01-04 DIAGNOSIS — I4891 Unspecified atrial fibrillation: Secondary | ICD-10-CM | POA: Insufficient documentation

## 2014-01-04 DIAGNOSIS — Z8543 Personal history of malignant neoplasm of ovary: Secondary | ICD-10-CM | POA: Diagnosis not present

## 2014-01-04 DIAGNOSIS — Z853 Personal history of malignant neoplasm of breast: Secondary | ICD-10-CM | POA: Insufficient documentation

## 2014-01-04 DIAGNOSIS — I5032 Chronic diastolic (congestive) heart failure: Secondary | ICD-10-CM | POA: Diagnosis not present

## 2014-01-04 DIAGNOSIS — Z8719 Personal history of other diseases of the digestive system: Secondary | ICD-10-CM | POA: Diagnosis not present

## 2014-01-04 DIAGNOSIS — R040 Epistaxis: Secondary | ICD-10-CM | POA: Diagnosis not present

## 2014-01-04 DIAGNOSIS — Z7901 Long term (current) use of anticoagulants: Secondary | ICD-10-CM | POA: Diagnosis not present

## 2014-01-04 DIAGNOSIS — Z79899 Other long term (current) drug therapy: Secondary | ICD-10-CM | POA: Diagnosis not present

## 2014-01-04 LAB — CBC WITH DIFFERENTIAL/PLATELET
Basophils Absolute: 0 10*3/uL (ref 0.0–0.1)
Basophils Relative: 0 % (ref 0–1)
EOS PCT: 1 % (ref 0–5)
Eosinophils Absolute: 0.1 10*3/uL (ref 0.0–0.7)
HCT: 29.7 % — ABNORMAL LOW (ref 36.0–46.0)
Hemoglobin: 9.8 g/dL — ABNORMAL LOW (ref 12.0–15.0)
LYMPHS PCT: 28 % (ref 12–46)
Lymphs Abs: 2.1 10*3/uL (ref 0.7–4.0)
MCH: 31.4 pg (ref 26.0–34.0)
MCHC: 33 g/dL (ref 30.0–36.0)
MCV: 95.2 fL (ref 78.0–100.0)
Monocytes Absolute: 0.6 10*3/uL (ref 0.1–1.0)
Monocytes Relative: 9 % (ref 3–12)
NEUTROS ABS: 4.7 10*3/uL (ref 1.7–7.7)
Neutrophils Relative %: 62 % (ref 43–77)
PLATELETS: 187 10*3/uL (ref 150–400)
RBC: 3.12 MIL/uL — ABNORMAL LOW (ref 3.87–5.11)
RDW: 16.7 % — ABNORMAL HIGH (ref 11.5–15.5)
WBC: 7.5 10*3/uL (ref 4.0–10.5)

## 2014-01-04 LAB — BASIC METABOLIC PANEL
ANION GAP: 14 (ref 5–15)
BUN: 18 mg/dL (ref 6–23)
CALCIUM: 9 mg/dL (ref 8.4–10.5)
CO2: 23 meq/L (ref 19–32)
Chloride: 102 mEq/L (ref 96–112)
Creatinine, Ser: 0.85 mg/dL (ref 0.50–1.10)
GFR calc Af Amer: 67 mL/min — ABNORMAL LOW (ref >90)
GFR calc non Af Amer: 58 mL/min — ABNORMAL LOW (ref >90)
Glucose, Bld: 108 mg/dL — ABNORMAL HIGH (ref 70–99)
Potassium: 3.9 mEq/L (ref 3.7–5.3)
Sodium: 139 mEq/L (ref 137–147)

## 2014-01-04 MED ORDER — SILVER NITRATE-POT NITRATE 75-25 % EX MISC
1.0000 "application " | CUTANEOUS | Status: AC
  Administered 2014-01-04: 1 via TOPICAL
  Filled 2014-01-04: qty 1

## 2014-01-04 NOTE — Discharge Instructions (Signed)
Keep rhinorocket in until you see ENT.   Apply ice to nose.   Hold eliquis for now.   Follow up with ENT.   Return to ER if you have worse nose bleed, swallowing blood, fevers.

## 2014-01-04 NOTE — ED Notes (Addendum)
Present with left nare epistaxis began at 1 pm today, taking eloquis blood thinner. Large amount of blood with no relief. Endorses dizziness and weakness

## 2014-01-04 NOTE — ED Provider Notes (Signed)
CSN: [id number]     Arrival date & time 01/04/14  2016 History   First MD Initiated Contact with Patient 01/04/14 2030     Chief Complaint  Patient presents with  . Epistaxis     (Consider location/radiation/quality/duration/timing/severity/associated sxs/prior Treatment) The history is provided by the patient.  [name] is a 80 y.o. female hx of HTN, SBO, DM, afib on eliquis here with nose bleed. She had NG tube placed last week for SBO. She was discharged from hospital several days ago. Today, started having nose bleed from L nostril. Tried phenylephrine and pressure with no relief. Has been swallowing blood and vomited it back up. No abdominal pain. Has been on eliquis but didn't take it today.    Past Medical History  Diagnosis Date  . Hypertension   . SBO (small bowel obstruction)   . Permanent atrial fibrillation     a. coumadin d/c'd => Pradaxa in 05/2012  . Chronic diastolic heart failure     a. Echo 5/12: Mild LVH, EF 55-60%, mild AI, mild MR, severe LAE, mild RAE, PASP 31, small pericardial effusion  . Venous insufficiency     chronic LE edema  . HLD (hyperlipidemia)   . OSA (obstructive sleep apnea)   . Hypothyroidism   . Diastolic CHF, chronic 1/74/9449    Class 2b-3 2 d echo 5/12 mild LVH EF 55-50%, MILD AI, MILD MR, SEVERE LAE, mild RAE, PASP 31, SMALL PERICRADIAL EFFUSION  . Hx of cervical cancer 11/11/2012  . S/P hysterectomy 11/11/2012  . DM2 (diabetes mellitus, type 2)   . Colon cancer     "polyp" (12/08/2012)  . Breast cancer   . Cervical cancer   . Ovarian cancer   . Bladder cancer   . H/O ovarian cancer 11/11/2012     MUCINOUS CYST S/P RESECTION 35 YEARS AGO  . Hx of bladder cancer 11/11/2012   Past Surgical History  Procedure Laterality Date  . Bladder surgery    . Appendectomy    . Vaginal hysterectomy    . Exploratory laparotomy with abdominal mass excision      "21# ovarian tumor; benign" (12/08/2012)  . Cataract extraction w/ intraocular lens   implant, bilateral    . Mastectomy, radical Left   . Breast lumpectomy Right   . Breast biopsy Bilateral   . I&d extremity Right 12/31/2012    Procedure: IRRIGATION AND DEBRIDEMENT RIGHT KNEE ULCER WITH PLACEMENT OF A CELL AND VAC ;  Surgeon: Theodoro Kos, DO;  Location: [facility];  Service: Plastics;  Laterality: Right;   History reviewed. No pertinent family history. History  Substance Use Topics  . Smoking status: Former Smoker -- 3.00 packs/day for 25 years    Types: Cigarettes    Quit date: 07/02/1966  . Smokeless tobacco: Never Used  . Alcohol Use: No   OB History   Grav Para Term Preterm Abortions TAB SAB Ect Mult Living                 Review of Systems  HENT: Positive for nosebleeds.   All other systems reviewed and are negative.     Allergies  Codeine; Morphine and related; Statins; and Zetia  Home Medications   Prior to Admission medications   Medication Sig Start Date End Date Taking? Authorizing Provider  apixaban (ELIQUIS) 5 MG TABS tablet Take 1 tablet (5 mg total) by mouth 2 (two) times daily. 04/24/13   Thompson Grayer, MD  bisacodyl (DULCOLAX) 5 MG EC tablet  Take 5 mg by mouth every morning.    Historical Provider, MD  diltiazem (CARDIZEM CD) 360 MG 24 hr capsule Take 360 mg by mouth every morning.  02/26/11   Jolaine Artist, MD  ferrous sulfate 324 (65 FE) MG TBEC Take 2 tablets by mouth daily.     Historical Provider, MD  furosemide (LASIX) 40 MG tablet Take 40 mg by mouth daily. Take 2 tabs daily    Historical Provider, MD  levothyroxine (SYNTHROID, LEVOTHROID) 200 MCG tablet Take 200 mcg by mouth daily before breakfast.    Historical Provider, MD  NON FORMULARY Potchlor SR 10 meq capsules. Take one capsule once a day    Historical Provider, MD  ondansetron (ZOFRAN ODT) 8 MG disintegrating tablet Take 1 tablet (8 mg total) by mouth every 8 (eight) hours as needed for nausea or vomiting. 01/03/14   Barton Dubois, MD  ramipril (ALTACE) 10 MG capsule Take 1  capsule (10 mg total) by mouth daily. 03/23/13   Thompson Grayer, MD   BP 138/57  Pulse 89  Temp(Src) 97.6 F (36.4 C) (Oral)  Resp 18  SpO2 96% Physical Exam  Nursing note and vitals reviewed. Constitutional: She is oriented to person, place, and time.  Chronically ill, covering her nose.  HENT:  Head: Normocephalic.  Minimal blood in posterior OP. Active bleeding L kisselbach triangle.   Eyes: Conjunctivae are normal. Pupils are equal, round, and reactive to light.  Neck: Normal range of motion. Neck supple.  Cardiovascular: Normal rate, regular rhythm and normal heart sounds.   Pulmonary/Chest: Effort normal and breath sounds normal. No respiratory distress. She has no wheezes. She has no rales.  Abdominal: Soft. Bowel sounds are normal. She exhibits no distension. There is no tenderness. There is no rebound and no guarding.  Neurological: She is alert and oriented to person, place, and time. No cranial nerve deficit. Coordination normal.  Skin: Skin is warm and dry.  Psychiatric: She has a normal mood and affect. Her behavior is normal. Judgment and thought content normal.    ED Course  EPISTAXIS MANAGEMENT Date/Time: 01/04/2014 10:22 PM Performed by: Wandra Arthurs Authorized by: Wandra Arthurs Consent: Verbal consent obtained. Risks and benefits: risks, benefits and alternatives were discussed Consent given by: patient Patient understanding: patient states understanding of the procedure being performed Patient consent: the patient's understanding of the procedure matches consent given Procedure consent: procedure consent matches procedure scheduled Relevant documents: relevant documents present and verified Required items: required blood products, implants, devices, and special equipment available Time out: Immediately prior to procedure a "time out" was called to verify the correct patient, procedure, equipment, support staff and site/side marked as required. Patient sedated:  no Treatment site: left anterior Repair method: silver nitrate and nasal balloon Post-procedure assessment: bleeding stopped Treatment complexity: simple Patient tolerance: Patient tolerated the procedure well with no immediate complications.   (including critical care time) Labs Review Labs Reviewed  CBC WITH DIFFERENTIAL  BASIC METABOLIC PANEL    Imaging Review Dg Abd 2 Views  01/03/2014   CLINICAL DATA:  Follow-up of small bowel obstruction status post excision of large benign ovarian mass  EXAM: ABDOMEN - 2 VIEW  COMPARISON:  Abdominal series of January 01, 2014  FINDINGS: The bowel gas pattern is more normal today. There do remain loops of mildly distended gas-filled small bowel but there is gas in the colon and rectum. No air-fluid levels are demonstrated. There is no free extraluminal gas. The bones are diffusely  osteopenic. There is degenerative change of the right hip.  IMPRESSION: Findings are consistent with improving small bowel obstruction.   Electronically Signed   By: Haylei Cobin  [place]   On: 01/03/2014 07:35     EKG Interpretation None      MDM   Final diagnoses:  None    Owen KENADY DOXTATER is a 80 y.o. female here with nose bleed. Some bleeding in L kisselbach triangle. I counseled them regarding attempting silver nitrate vs rhinorocket. They want to try silver nitrate first. I was able to stop bleeding with silver nitrate. Tolerated fluids with no bleeding. Observed for several hours. Hg stable. Recommend continue phenylephrine and ENT f/u.   11:26 PM Upon discharge, start having some minimal bleed. Placed rhinorocket. Will give her ENT f/u.    Wandra Arthurs, MD 01/04/14 [phone]

## 2014-01-04 NOTE — ED Notes (Signed)
During assistance to bedside commode, pt nose began to bleed minimal for a few seconds, pressure applied, spotting noted to washcloth. Darl Householder, [place] notified.

## 2014-01-04 NOTE — ED Notes (Signed)
Pharmacy sts will send silver nitrate

## 2014-01-08 DIAGNOSIS — R040 Epistaxis: Secondary | ICD-10-CM | POA: Diagnosis not present

## 2014-01-14 DIAGNOSIS — E118 Type 2 diabetes mellitus with unspecified complications: Secondary | ICD-10-CM | POA: Diagnosis not present

## 2014-01-14 DIAGNOSIS — Z23 Encounter for immunization: Secondary | ICD-10-CM | POA: Diagnosis not present

## 2014-01-14 DIAGNOSIS — D649 Anemia, unspecified: Secondary | ICD-10-CM | POA: Diagnosis not present

## 2014-01-14 DIAGNOSIS — K56609 Unspecified intestinal obstruction, unspecified as to partial versus complete obstruction: Secondary | ICD-10-CM | POA: Diagnosis not present

## 2014-01-14 DIAGNOSIS — I10 Essential (primary) hypertension: Secondary | ICD-10-CM | POA: Diagnosis not present

## 2014-01-14 DIAGNOSIS — R799 Abnormal finding of blood chemistry, unspecified: Secondary | ICD-10-CM | POA: Diagnosis not present

## 2014-01-14 DIAGNOSIS — D510 Vitamin B12 deficiency anemia due to intrinsic factor deficiency: Secondary | ICD-10-CM | POA: Diagnosis not present

## 2014-01-18 ENCOUNTER — Encounter: Payer: Self-pay | Admitting: Internal Medicine

## 2014-01-18 ENCOUNTER — Ambulatory Visit (INDEPENDENT_AMBULATORY_CARE_PROVIDER_SITE_OTHER): Payer: Medicare Other | Admitting: Internal Medicine

## 2014-01-18 VITALS — BP 139/78 | HR 71 | Ht 61.0 in | Wt 202.1 lb

## 2014-01-18 DIAGNOSIS — K56609 Unspecified intestinal obstruction, unspecified as to partial versus complete obstruction: Secondary | ICD-10-CM

## 2014-01-18 DIAGNOSIS — I4891 Unspecified atrial fibrillation: Secondary | ICD-10-CM | POA: Diagnosis not present

## 2014-01-18 DIAGNOSIS — I10 Essential (primary) hypertension: Secondary | ICD-10-CM

## 2014-01-18 DIAGNOSIS — I509 Heart failure, unspecified: Secondary | ICD-10-CM

## 2014-01-18 DIAGNOSIS — I5032 Chronic diastolic (congestive) heart failure: Secondary | ICD-10-CM | POA: Diagnosis not present

## 2014-01-18 MED ORDER — APIXABAN 5 MG PO TABS: 5.0000 mg | ORAL_TABLET | Freq: Two times a day (BID) | ORAL | Status: DC

## 2014-01-18 MED ORDER — FUROSEMIDE 40 MG PO TABS: 40.0000 mg | ORAL_TABLET | Freq: Every day | ORAL | Status: DC

## 2014-01-18 NOTE — Patient Instructions (Signed)
Your physician recommends that you schedule a follow-up appointment in: 6 weeks with [name] and 6 months with Dr [name]  Your physician has recommended you make the following change in your medication:  1) Decrease Furosemide to 40mg  daily 2) Restart Eliquis on 01/30/14

## 2014-01-18 NOTE — Progress Notes (Signed)
PCP: [name], MD  The patient presents today for routine cardiology followup.  She was hospitalized 6/15 with recurrent SBO.  She subsequently had epistaxis due to nasal trauma, likely related to her NGT.  Her eliquis has been on hold for 2 weeks. She has also had postural dizziness since she left the hospital.  Today, she denies symptoms of palpitations, chest pain, shortness of breath, orthopnea, PND, dizziness, presyncope, syncope, or neurologic sequela.   She remains active despite her age.  Her edema is improved. The patient feels that she is tolerating medications without difficulties and is otherwise without complaint today.   Past Medical History  Diagnosis Date  . Hypertension   . SBO (small bowel obstruction)   . Permanent atrial fibrillation     a. coumadin d/c'd => Pradaxa in 05/2012  . Chronic diastolic heart failure     a. Echo 5/12: Mild LVH, EF 55-60%, mild AI, mild MR, severe LAE, mild RAE, PASP 31, small pericardial effusion  . Venous insufficiency     chronic LE edema  . HLD (hyperlipidemia)   . OSA (obstructive sleep apnea)   . Hypothyroidism   . Diastolic CHF, chronic 10/08/8117    Class 2b-3 2 d echo 5/12 mild LVH EF 55-50%, MILD AI, MILD MR, SEVERE LAE, mild RAE, PASP 31, SMALL PERICRADIAL EFFUSION  . Hx of cervical cancer 11/11/2012  . S/P hysterectomy 11/11/2012  . DM2 (diabetes mellitus, type 2)   . Colon cancer     "polyp" (12/08/2012)  . Breast cancer   . Cervical cancer   . Ovarian cancer   . Bladder cancer   . H/O ovarian cancer 11/11/2012     MUCINOUS CYST S/P RESECTION 35 YEARS AGO  . Hx of bladder cancer 11/11/2012   Past Surgical History  Procedure Laterality Date  . Bladder surgery    . Appendectomy    . Vaginal hysterectomy    . Exploratory laparotomy with abdominal mass excision      "21# ovarian tumor; benign" (12/08/2012)  . Cataract extraction w/ intraocular lens  implant, bilateral    . Mastectomy, radical Left   . Breast lumpectomy  Right   . Breast biopsy Bilateral   . I&d extremity Right 12/31/2012    Procedure: IRRIGATION AND DEBRIDEMENT RIGHT KNEE ULCER WITH PLACEMENT OF A CELL AND VAC ;  Surgeon: Theodoro Kos, DO;  Location: [facility];  Service: Plastics;  Laterality: Right;    Current Outpatient Prescriptions  Medication Sig Dispense Refill  . bisacodyl (DULCOLAX) 5 MG EC tablet Take 5 mg by mouth every morning.      . diltiazem (CARDIZEM CD) 360 MG 24 hr capsule Take 360 mg by mouth every morning.       . ferrous sulfate 324 (65 FE) MG TBEC Take 324 mg by mouth 2 (two) times daily.       . furosemide (LASIX) 40 MG tablet Take 80 mg by mouth daily.       Marland Kitchen levothyroxine (SYNTHROID, LEVOTHROID) 200 MCG tablet Take 200 mcg by mouth daily before breakfast.      . Phenylephrine HCl (NEO-SYNEPHRINE OP) Apply 2 sprays to eye as needed (for bleeding nose).      . potassium chloride SA (K-DUR,KLOR-CON) 20 MEQ tablet Take 20 mEq by mouth daily.      . ramipril (ALTACE) 10 MG capsule Take 1 capsule (10 mg total) by mouth daily.  90 capsule  3   No current facility-administered medications for this visit.  Allergies  Allergen Reactions  . Codeine     unknown  . Morphine And Related     sick  . Statins     sick  . Zetia [Ezetimibe] Other (See Comments)    Side effect to strong     History   Social History  . Marital Status: Widowed    Spouse Name: N/A    Number of Children: N/A  . Years of Education: N/A   Occupational History  . Not on file.   Social History Main Topics  . Smoking status: Former Smoker -- 3.00 packs/day for 25 years    Types: Cigarettes    Quit date: 07/02/1966  . Smokeless tobacco: Never Used  . Alcohol Use: No  . Drug Use: No  . Sexual Activity: No   Other Topics Concern  . Not on file   Social History Narrative  . No narrative on file     Physical Exam: Filed Vitals:   01/18/14 1407  BP: 139/78  Pulse: 71  Height: 5\' 1"  (1.549 m)  Weight: 202 lb 1.9 oz (91.681 kg)     GEN- The patient is elderly appearing, alert and oriented x 3 today.   Head- normocephalic, atraumatic Eyes-  Sclera clear, conjunctiva pink Ears- hearing intact Oropharynx- clear Neck- supple, flat JVP Lungs- Clear to ausculation bilaterally, normal work of breathing Heart- irregular rate and rhythm, no murmurs, rubs or gallops, PMI not laterally displaced GI- soft, NT, ND, + BS Extremities- no clubbing, cyanosis, 1+ BLE edema, knee has healed nicely Neuro- strength and sensation are intact  Assessment and Plan:  1. Permanent afib Doing well No active bleeding. Restart eliquis 01/30/14 if her epistaxis remains resolved  2. Chronic diastolic dysfunction She appears dry and has postural dizziness I will decrease lasix to 40mg  daily today She will return to see Truitt Merle in 6 weeks for follow-up She has venous insufficiency which makes fluid evaluation difficult.  She is not interested in support hose  3. HTN Stable No change required today  Return in 6 months to see me Follow-up with Truitt Merle in 6 weeks

## 2014-01-25 ENCOUNTER — Encounter: Payer: Self-pay | Admitting: Podiatry

## 2014-01-25 ENCOUNTER — Ambulatory Visit (INDEPENDENT_AMBULATORY_CARE_PROVIDER_SITE_OTHER): Payer: Medicare Other | Admitting: Podiatry

## 2014-01-25 VITALS — BP 109/57 | HR 77 | Resp 18

## 2014-01-25 DIAGNOSIS — B351 Tinea unguium: Secondary | ICD-10-CM | POA: Diagnosis not present

## 2014-01-25 DIAGNOSIS — M79609 Pain in unspecified limb: Secondary | ICD-10-CM

## 2014-01-25 DIAGNOSIS — M79673 Pain in unspecified foot: Secondary | ICD-10-CM

## 2014-01-26 NOTE — Progress Notes (Signed)
Patient ID: [name], female   DOB: 09/04/1919, 80 y.o.   MRN: [id number]  Subjective: Orientated x3 white female presents complaining of painful toenails  Objective:  elongated, incurvated, hypertrophied, discolored toenails 6-10  Assessment: Symptomatic onychomycoses 6-10  Plan: Debridement of toenails 6-10 without any bleeding  Reappoint x3 months

## 2014-01-29 DIAGNOSIS — E538 Deficiency of other specified B group vitamins: Secondary | ICD-10-CM | POA: Diagnosis not present

## 2014-01-29 DIAGNOSIS — D62 Acute posthemorrhagic anemia: Secondary | ICD-10-CM | POA: Diagnosis not present

## 2014-03-10 ENCOUNTER — Ambulatory Visit (INDEPENDENT_AMBULATORY_CARE_PROVIDER_SITE_OTHER): Payer: Medicare Other | Admitting: Nurse Practitioner

## 2014-03-10 ENCOUNTER — Encounter: Payer: Self-pay | Admitting: Nurse Practitioner

## 2014-03-10 VITALS — BP 120/80 | HR 82 | Ht 61.5 in | Wt 204.8 lb

## 2014-03-10 DIAGNOSIS — I4891 Unspecified atrial fibrillation: Secondary | ICD-10-CM | POA: Diagnosis not present

## 2014-03-10 DIAGNOSIS — I509 Heart failure, unspecified: Secondary | ICD-10-CM | POA: Diagnosis not present

## 2014-03-10 DIAGNOSIS — Z7901 Long term (current) use of anticoagulants: Secondary | ICD-10-CM

## 2014-03-10 DIAGNOSIS — I482 Chronic atrial fibrillation, unspecified: Secondary | ICD-10-CM

## 2014-03-10 DIAGNOSIS — I5032 Chronic diastolic (congestive) heart failure: Secondary | ICD-10-CM

## 2014-03-10 DIAGNOSIS — I10 Essential (primary) hypertension: Secondary | ICD-10-CM | POA: Diagnosis not present

## 2014-03-10 LAB — BASIC METABOLIC PANEL
BUN: 12 mg/dL (ref 6–23)
CO2: 31 mEq/L (ref 19–32)
Calcium: 8.7 mg/dL (ref 8.4–10.5)
Chloride: 102 mEq/L (ref 96–112)
Creatinine, Ser: 0.8 mg/dL (ref 0.4–1.2)
GFR: 74.46 mL/min (ref >60.00)
Glucose, Bld: 73 mg/dL (ref 70–99)
Potassium: 3.8 mEq/L (ref 3.5–5.1)
Sodium: 140 mEq/L (ref 135–145)

## 2014-03-10 LAB — CBC
HCT: 40.7 % (ref 36.0–46.0)
Hemoglobin: 13.3 g/dL (ref 12.0–15.0)
MCHC: 32.6 g/dL (ref 30.0–36.0)
MCV: 95 fl (ref 78.0–100.0)
Platelets: 188 10*3/uL (ref 150.0–400.0)
RBC: 4.28 Mil/uL (ref 3.87–5.11)
RDW: 14.2 % (ref 11.5–15.5)
WBC: 5.7 10*3/uL (ref 4.0–10.5)

## 2014-03-10 NOTE — Progress Notes (Signed)
[name] Date of Birth: [birth year] Medical Record #[id number]  History of Present Illness: Ms. Helling is seen back today for a follow up visit. Seen for Dr. Rayann Heman - this is a 6 week check. She has HTN, past SBO, permanent atrial fib - previously on coumadin as well as pradaxa and now on Eliquis, chronic diastolic HF, venous insuficiency, HLD, OSA, type 2 DM and numerous cancers.  Seen here back in July - had been hospitalized back in June with SBO and had an NG tube - then had epistaxis - Eliquis was held for 2 weeks - restarted as of 01/30/14.  Comes in today. Here with her daughter. Here to discuss Eliquis. She is back taking. Says she feels good. Breathing ok. Swelling stable. Weight stable. No recent labs since what is noted in EPIC. Tolerating her medicines. Remains on iron and B12. No palpitations. No chest pain. Daughter notes that her mother has been noncompliant for many years - not likely to change. This was apparently her 40 or 16th SBO and she is treated medically.   Current Outpatient Prescriptions  Medication Sig Dispense Refill  . apixaban (ELIQUIS) 5 MG TABS tablet Take 1 tablet (5 mg total) by mouth 2 (two) times daily.  180 tablet  3  . bisacodyl (DULCOLAX) 5 MG EC tablet Take 5 mg by mouth every morning.      . Cyanocobalamin (VITAMIN B-12 IJ) Inject as directed as directed. Every two weeks. 1 cc      . diltiazem (CARDIZEM CD) 360 MG 24 hr capsule Take 360 mg by mouth every morning.       . ferrous sulfate 324 (65 FE) MG TBEC Take 324 mg by mouth 2 (two) times daily.       . furosemide (LASIX) 40 MG tablet Take 1 tablet (40 mg total) by mouth daily.  90 tablet  3  . levothyroxine (SYNTHROID, LEVOTHROID) 200 MCG tablet Take 200 mcg by mouth daily before breakfast.      . Phenylephrine HCl (NEO-SYNEPHRINE OP) Apply 2 sprays to eye as needed (for bleeding nose).      . potassium chloride SA (K-DUR,KLOR-CON) 20 MEQ tablet Take 20 mEq by mouth daily.      . ramipril  (ALTACE) 10 MG capsule Take 1 capsule (10 mg total) by mouth daily.  90 capsule  3   No current facility-administered medications for this visit.    Allergies  Allergen Reactions  . Codeine     unknown  . Morphine And Related     sick  . Statins     sick  . Zetia [Ezetimibe] Other (See Comments)    Side effect to strong     Past Medical History  Diagnosis Date  . Hypertension   . SBO (small bowel obstruction)   . Permanent atrial fibrillation     a. coumadin d/c'd => Pradaxa in 05/2012  . Chronic diastolic heart failure     a. Echo 5/12: Mild LVH, EF 55-60%, mild AI, mild MR, severe LAE, mild RAE, PASP 31, small pericardial effusion  . Venous insufficiency     chronic LE edema  . HLD (hyperlipidemia)   . OSA (obstructive sleep apnea)   . Hypothyroidism   . Diastolic CHF, chronic 08/21/2540    Class 2b-3 2 d echo 5/12 mild LVH EF 55-50%, MILD AI, MILD MR, SEVERE LAE, mild RAE, PASP 31, SMALL PERICRADIAL EFFUSION  . Hx of cervical cancer 11/11/2012  . S/P hysterectomy 11/11/2012  .  DM2 (diabetes mellitus, type 2)   . Colon cancer     "polyp" (12/08/2012)  . Breast cancer   . Cervical cancer   . Ovarian cancer   . Bladder cancer   . H/O ovarian cancer 11/11/2012     MUCINOUS CYST S/P RESECTION 35 YEARS AGO  . Hx of bladder cancer 11/11/2012    Past Surgical History  Procedure Laterality Date  . Bladder surgery    . Appendectomy    . Vaginal hysterectomy    . Exploratory laparotomy with abdominal mass excision      "21# ovarian tumor; benign" (12/08/2012)  . Cataract extraction w/ intraocular lens  implant, bilateral    . Mastectomy, radical Left   . Breast lumpectomy Right   . Breast biopsy Bilateral   . I&d extremity Right 12/31/2012    Procedure: IRRIGATION AND DEBRIDEMENT RIGHT KNEE ULCER WITH PLACEMENT OF A CELL AND VAC ;  Surgeon: Theodoro Kos, DO;  Location: [facility];  Service: Plastics;  Laterality: Right;    History  Smoking status  . Former Smoker -- 3.00  packs/day for 25 years  . Types: Cigarettes  . Quit date: 07/02/1966  Smokeless tobacco  . Never Used    History  Alcohol Use No    No family history on file.  Review of Systems: The review of systems is per the HPI.  All other systems were reviewed and are negative.  Physical Exam: Pulse 82  Ht 5' 1.5" (1.562 m)  Wt 204 lb 12.8 oz (92.897 kg)  BMI 38.07 kg/m2  SpO2 96% BP is 120/80 by me. Patient is very pleasant and in no acute distress. Her weight is stable. Skin is warm and dry. Color is normal.  HEENT is unremarkable. Normocephalic/atraumatic. PERRL. Sclera are nonicteric. Neck is supple. No masses. No JVD. Lungs are clear. Cardiac exam shows an irregular rhythm. Rate ok. Abdomen is soft. Extremities are without edema. Gait and ROM are intact. No gross neurologic deficits noted.  Wt Readings from Last 3 Encounters:  03/10/14 204 lb 12.8 oz (92.897 kg)  01/18/14 202 lb 1.9 oz (91.681 kg)  01/03/14 203 lb 9.6 oz (92.352 kg)    LABORATORY DATA/PROCEDURES:  Lab Results  Component Value Date   WBC 7.5 01/04/2014   HGB 9.8* 01/04/2014   HCT 29.7* 01/04/2014   PLT 187 01/04/2014   GLUCOSE 108* 01/04/2014   CHOL 173 11/04/2010   TRIG 100 11/04/2010   HDL 45 11/04/2010   LDLCALC  Value: 108        Total Cholesterol/HDL:CHD Risk Coronary Heart Disease Risk Table                     Men   Women  1/2 Average Risk   3.4   3.3  Average Risk       5.0   4.4  2 X Average Risk   9.6   7.1  3 X Average Risk  23.4   11.0        Use the calculated Patient Ratio above and the CHD Risk Table to determine the patient's CHD Risk.        ATP III CLASSIFICATION (LDL):  <100     mg/dL   Optimal  100-129  mg/dL   Near or Above                    Optimal  130-159  mg/dL   Borderline  160-189  mg/dL  High  >190     mg/dL   Very High* 11/04/2010   ALT 20 12/29/2013   AST 27 12/29/2013   NA 139 01/04/2014   K 3.9 01/04/2014   CL 102 01/04/2014   CREATININE 0.85 01/04/2014   BUN 18 01/04/2014   CO2 23 01/04/2014   TSH  9.538* 12/08/2012   INR 1.30 12/29/2013   HGBA1C 5.7* 01/01/2014    BNP (last 3 results) No results found for this basename: PROBNP,  in the last 8760 hours   Assessment / Plan: 1. Chronic atrial fib - managed with rate control and anticoagulation  2. Chronic anticoagulation - no current issues noted.  3. Prior epistaxis - from nasal trauma due to NG tube while with SBO  4. Chronic diastolic HF - stable at present.  See back in January. Check follow up labs today. No change in current regimen.   Patient is agreeable to this plan and will call if any problems develop in the interim.   Burtis Junes, RN, [place] [address] [place], [place]  [phone]

## 2014-03-10 NOTE — Patient Instructions (Addendum)
Continue with your current medicines  See Dr. Rayann Heman back in January  We should check follow up labs today  Call the [place] office at [phone] if you have any questions, problems or concerns.

## 2014-03-12 ENCOUNTER — Other Ambulatory Visit: Payer: Self-pay | Admitting: Internal Medicine

## 2014-04-19 DIAGNOSIS — Z23 Encounter for immunization: Secondary | ICD-10-CM | POA: Diagnosis not present

## 2014-04-19 DIAGNOSIS — E039 Hypothyroidism, unspecified: Secondary | ICD-10-CM | POA: Diagnosis not present

## 2014-05-10 DIAGNOSIS — E039 Hypothyroidism, unspecified: Secondary | ICD-10-CM | POA: Diagnosis not present

## 2014-05-10 DIAGNOSIS — D510 Vitamin B12 deficiency anemia due to intrinsic factor deficiency: Secondary | ICD-10-CM | POA: Diagnosis not present

## 2014-05-10 DIAGNOSIS — D62 Acute posthemorrhagic anemia: Secondary | ICD-10-CM | POA: Diagnosis not present

## 2014-05-10 DIAGNOSIS — D519 Vitamin B12 deficiency anemia, unspecified: Secondary | ICD-10-CM | POA: Diagnosis not present

## 2014-05-10 DIAGNOSIS — Z23 Encounter for immunization: Secondary | ICD-10-CM | POA: Diagnosis not present

## 2014-05-12 ENCOUNTER — Encounter: Payer: Self-pay | Admitting: Podiatry

## 2014-05-12 ENCOUNTER — Ambulatory Visit: Payer: Medicare Other | Admitting: Podiatry

## 2014-05-12 ENCOUNTER — Ambulatory Visit (INDEPENDENT_AMBULATORY_CARE_PROVIDER_SITE_OTHER): Payer: Medicare Other | Admitting: Podiatry

## 2014-05-12 VITALS — BP 148/69 | HR 72 | Resp 12

## 2014-05-12 DIAGNOSIS — R0989 Other specified symptoms and signs involving the circulatory and respiratory systems: Secondary | ICD-10-CM

## 2014-05-12 DIAGNOSIS — M79676 Pain in unspecified toe(s): Secondary | ICD-10-CM

## 2014-05-12 DIAGNOSIS — E0842 Diabetes mellitus due to underlying condition with diabetic polyneuropathy: Secondary | ICD-10-CM | POA: Diagnosis not present

## 2014-05-12 DIAGNOSIS — B351 Tinea unguium: Secondary | ICD-10-CM | POA: Diagnosis not present

## 2014-05-12 NOTE — Progress Notes (Signed)
Patient ID: [name], female   DOB: 09/30/1919, 80 y.o.   MRN: [id number]  Subjective: This known diabetic patient presents for ongoing debridement of painful mycotic toenails and is requesting diabetic shoes  Objective: Orientated 3  Vascular: DP pulses 2/4 bilaterally PT pulses 0/4 for bilaterally  Neurological: Sensation to 10 g monofilament wire intact 2/5 right and 0/5 left Vibratory sensation nonreactive bilaterally Ankle reflex equal and reactive bilaterally  Dermatological: Hyperpigmentation, atrophic shiny skin lower legs bilaterally The toenails are elongated, incurvated, discolored 6-10  Musculoskeletal: No restriction ankle, subtalar, midtarsal joints bilaterally Adductovarus fifth toes bilaterally   Assessment: Type 2 diabetes with diabetic neuropathy Loss of protective sensation Loss of vibratory sensation Hammertoe deformities fifth bilaterally Symptomatic onychomycoses 6-10  Plan: We'll obtain certification for diabetic shoes from Dr. Lennette Bihari little with the indications of: Hammertoe deformities fifth bilaterally Type 2 diabetes with neuropathy, loss of vibratory sensation loss of protective sensation Diminished posterior tibial pulses bilaterally  Debrided toenails 10 without a bleeding  Notify patient upon receipt of certification for diabetic shoes Reappoint at three-month intervals for toenail debridement

## 2014-05-12 NOTE — Patient Instructions (Signed)
Our office will contact Dr. Rex Kras for certification for diabetic shoes Our office will contact you for an appointment to measure for the shoes after clearance is obtained  Diabetes and Foot Care Diabetes may cause you to have problems because of poor blood supply (circulation) to your feet and legs. This may cause the skin on your feet to become thinner, break easier, and heal more slowly. Your skin may become dry, and the skin may peel and crack. You may also have nerve damage in your legs and feet causing decreased feeling in them. You may not notice minor injuries to your feet that could lead to infections or more serious problems. Taking care of your feet is one of the most important things you can do for yourself.  HOME CARE INSTRUCTIONS  Wear shoes at all times, even in the house. Do not go barefoot. Bare feet are easily injured.  Check your feet daily for blisters, cuts, and redness. If you cannot see the bottom of your feet, use a mirror or ask someone for help.  Wash your feet with warm water (do not use hot water) and mild soap. Then pat your feet and the areas between your toes until they are completely dry. Do not soak your feet as this can dry your skin.  Apply a moisturizing lotion or petroleum jelly (that does not contain alcohol and is unscented) to the skin on your feet and to dry, brittle toenails. Do not apply lotion between your toes.  Trim your toenails straight across. Do not dig under them or around the cuticle. File the edges of your nails with an emery board or nail file.  Do not cut corns or calluses or try to remove them with medicine.  Wear clean socks or stockings every day. Make sure they are not too tight. Do not wear knee-high stockings since they may decrease blood flow to your legs.  Wear shoes that fit properly and have enough cushioning. To break in new shoes, wear them for just a few hours a day. This prevents you from injuring your feet. Always look in your  shoes before you put them on to be sure there are no objects inside.  Do not cross your legs. This may decrease the blood flow to your feet.  If you find a minor scrape, cut, or break in the skin on your feet, keep it and the skin around it clean and dry. These areas may be cleansed with mild soap and water. Do not cleanse the area with peroxide, alcohol, or iodine.  When you remove an adhesive bandage, be sure not to damage the skin around it.  If you have a wound, look at it several times a day to make sure it is healing.  Do not use heating pads or hot water bottles. They may burn your skin. If you have lost feeling in your feet or legs, you may not know it is happening until it is too late.  Make sure your health care provider performs a complete foot exam at least annually or more often if you have foot problems. Report any cuts, sores, or bruises to your health care provider immediately. SEEK MEDICAL CARE IF:   You have an injury that is not healing.  You have cuts or breaks in the skin.  You have an ingrown nail.  You notice redness on your legs or feet.  You feel burning or tingling in your legs or feet.  You have pain or cramps  in your legs and feet.  Your legs or feet are numb.  Your feet always feel cold. SEEK IMMEDIATE MEDICAL CARE IF:   There is increasing redness, swelling, or pain in or around a wound.  There is a red line that goes up your leg.  Pus is coming from a wound.  You develop a fever or as directed by your health care provider.  You notice a bad smell coming from an ulcer or wound. Document Released: 06/15/2000 Document Revised: 02/18/2013 Document Reviewed: 11/25/2012 [facility] Patient Information 2015 [place], [place]. This information is not intended to replace advice given to you by your health care provider. Make sure you discuss any questions you have with your health care provider.

## 2014-05-13 ENCOUNTER — Ambulatory Visit (INDEPENDENT_AMBULATORY_CARE_PROVIDER_SITE_OTHER): Payer: Medicare Other | Admitting: *Deleted

## 2014-05-13 DIAGNOSIS — I4891 Unspecified atrial fibrillation: Secondary | ICD-10-CM | POA: Diagnosis not present

## 2014-05-13 LAB — BASIC METABOLIC PANEL
BUN: 17 mg/dL (ref 6–23)
CHLORIDE: 104 meq/L (ref 96–112)
CO2: 21 mEq/L (ref 19–32)
Calcium: 9.3 mg/dL (ref 8.4–10.5)
Creatinine, Ser: 0.8 mg/dL (ref 0.4–1.2)
GFR: 73.34 mL/min (ref >60.00)
GLUCOSE: 115 mg/dL — AB (ref 70–99)
POTASSIUM: 4.5 meq/L (ref 3.5–5.1)
Sodium: 140 mEq/L (ref 135–145)

## 2014-05-13 NOTE — Progress Notes (Signed)
Pt was started on Eliquis for Afib  on 03/15/2013   Reviewed patients medication list.  Pt is not currently on any combined P-gp and strong CYP3A4 inhibitors/inducers (ketoconazole, traconazole, ritonavir, carbamazepine, phenytoin, rifampin, St. John's wort).  Reviewed labs.  Pt had CBC drawn 05/10/14 at [facility], results obtained. Thus, BMET drawn today. SCr 0.8,  Weight 94.5Kg. , Age 80 yrs.   Dose appropriate based on specified criteria.   Hgb and HCT 14.0/43.7. Pt will continue taking 5mg s of Eliquis BID. Pt has received her flu shot for the season.   A full discussion of the nature of anticoagulants has been carried out.  A benefit/risk analysis has been presented to the patient, so that they understand the justification for choosing anticoagulation with Eliquis at this time.  The need for compliance is stressed.  Pt is aware to take the medication twice daily.  Side effects of potential bleeding are discussed, including unusual colored urine or stools, coughing up blood or coffee ground emesis, nose bleeds or serious fall or head trauma.  Discussed signs and symptoms of stroke. The patient should avoid any OTC items containing aspirin or ibuprofen.  Avoid alcohol consumption.   Call if any signs of abnormal bleeding.  Discussed financial obligations and resolved any difficulty in obtaining medication.  Next lab test test in 6 months on 11/11/14.

## 2014-05-21 ENCOUNTER — Telehealth: Payer: Self-pay

## 2014-05-21 NOTE — Telephone Encounter (Signed)
Spoke with patient regarding diabetic shoes status. I told her as soon as we hear from her diabteic doctor and receive signed paper work, we would get her in to have her measured for shoes

## 2014-05-21 NOTE — Telephone Encounter (Signed)
Spoke with patient regarding diabetic shoe status, told her that she will receieve a post card in the mail when her doctor signs the verification papers

## 2014-05-23 ENCOUNTER — Other Ambulatory Visit: Payer: Self-pay | Admitting: Internal Medicine

## 2014-05-31 ENCOUNTER — Ambulatory Visit: Payer: Medicare Other | Admitting: Podiatry

## 2014-06-18 ENCOUNTER — Ambulatory Visit: Payer: Medicare Other

## 2014-06-18 DIAGNOSIS — E0842 Diabetes mellitus due to underlying condition with diabetic polyneuropathy: Secondary | ICD-10-CM

## 2014-06-18 NOTE — Progress Notes (Signed)
Pt is here to be measured for diabetic shoes and inserts

## 2014-07-09 ENCOUNTER — Encounter (INDEPENDENT_AMBULATORY_CARE_PROVIDER_SITE_OTHER): Payer: Self-pay

## 2014-07-09 ENCOUNTER — Ambulatory Visit (INDEPENDENT_AMBULATORY_CARE_PROVIDER_SITE_OTHER): Payer: Medicare Other | Admitting: Internal Medicine

## 2014-07-09 ENCOUNTER — Encounter: Payer: Self-pay | Admitting: Internal Medicine

## 2014-07-09 VITALS — BP 140/82 | HR 70 | Ht 61.0 in | Wt 214.8 lb

## 2014-07-09 DIAGNOSIS — I482 Chronic atrial fibrillation, unspecified: Secondary | ICD-10-CM

## 2014-07-09 DIAGNOSIS — I10 Essential (primary) hypertension: Secondary | ICD-10-CM | POA: Diagnosis not present

## 2014-07-09 DIAGNOSIS — I5032 Chronic diastolic (congestive) heart failure: Secondary | ICD-10-CM

## 2014-07-09 NOTE — Progress Notes (Signed)
PCP: [name], MD  The patient presents today for routine cardiology followup.  She has done very well recently. Today, she denies symptoms of palpitations, chest pain, shortness of breath, orthopnea, PND, dizziness, presyncope, syncope, or neurologic sequela.   She remains active despite her age.  Her edema is stable. The patient feels that she is tolerating medications without difficulties and is otherwise without complaint today.   Past Medical History  Diagnosis Date  . Hypertension   . SBO (small bowel obstruction)   . Permanent atrial fibrillation     a. coumadin d/c'd => Pradaxa in 05/2012  . Chronic diastolic heart failure     a. Echo 5/12: Mild LVH, EF 55-60%, mild AI, mild MR, severe LAE, mild RAE, PASP 31, small pericardial effusion  . Venous insufficiency     chronic LE edema  . HLD (hyperlipidemia)   . OSA (obstructive sleep apnea)   . Hypothyroidism   . Diastolic CHF, chronic 2/54/2706    Class 2b-3 2 d echo 5/12 mild LVH EF 55-50%, MILD AI, MILD MR, SEVERE LAE, mild RAE, PASP 31, SMALL PERICRADIAL EFFUSION  . Hx of cervical cancer 11/11/2012  . S/P hysterectomy 11/11/2012  . DM2 (diabetes mellitus, type 2)   . Colon cancer     "polyp" (12/08/2012)  . Breast cancer   . Cervical cancer   . Ovarian cancer   . Bladder cancer   . H/O ovarian cancer 11/11/2012     MUCINOUS CYST S/P RESECTION 35 YEARS AGO  . Hx of bladder cancer 11/11/2012   Past Surgical History  Procedure Laterality Date  . Bladder surgery    . Appendectomy    . Vaginal hysterectomy    . Exploratory laparotomy with abdominal mass excision      "21# ovarian tumor; benign" (12/08/2012)  . Cataract extraction w/ intraocular lens  implant, bilateral    . Mastectomy, radical Left   . Breast lumpectomy Right   . Breast biopsy Bilateral   . I&d extremity Right 12/31/2012    Procedure: IRRIGATION AND DEBRIDEMENT RIGHT KNEE ULCER WITH PLACEMENT OF A CELL AND VAC ;  Surgeon: Theodoro Kos, DO;  Location:  [facility];  Service: Plastics;  Laterality: Right;    Current Outpatient Prescriptions  Medication Sig Dispense Refill  . apixaban (ELIQUIS) 5 MG TABS tablet Take 1 tablet (5 mg total) by mouth 2 (two) times daily. 180 tablet 3  . bisacodyl (DULCOLAX) 5 MG EC tablet Take 5 mg by mouth daily.     . Cyanocobalamin (VITAMIN B-12 IJ) Inject 1 mL as directed every 30 (thirty) days.     . furosemide (LASIX) 40 MG tablet Take 1 tablet (40 mg total) by mouth daily. 90 tablet 3  . IRON PO Take 65 mg by mouth daily.    Marland Kitchen levothyroxine (SYNTHROID, LEVOTHROID) 200 MCG tablet Take 200 mcg by mouth daily before breakfast.    . Phenylephrine HCl (NEO-SYNEPHRINE OP) Apply 2 sprays to eye as needed (for bleeding nose).    . potassium chloride SA (K-DUR,KLOR-CON) 20 MEQ tablet Take 20 mEq by mouth daily.    . ramipril (ALTACE) 10 MG capsule Take 1 capsule (10 mg total) by mouth daily. 90 capsule 3   No current facility-administered medications for this visit.    Allergies  Allergen Reactions  . Codeine     unknown  . Morphine And Related     sick  . Statins     sick  . Zetia [Ezetimibe] Other (See  Comments)    Side effect to strong     History   Social History  . Marital Status: Widowed    Spouse Name: N/A    Number of Children: N/A  . Years of Education: N/A   Occupational History  . Not on file.   Social History Main Topics  . Smoking status: Former Smoker -- 3.00 packs/day for 25 years    Types: Cigarettes    Quit date: 07/02/1966  . Smokeless tobacco: Never Used  . Alcohol Use: No  . Drug Use: No  . Sexual Activity: No   Other Topics Concern  . Not on file   Social History Narrative     Physical Exam: Filed Vitals:   07/09/14 1504  BP: 140/82  Pulse: 70  Height: 5\' 1"  (1.549 m)  Weight: 214 lb 12.8 oz (97.433 kg)    GEN- The patient is elderly appearing, alert and oriented x 3 today.   Head- normocephalic, atraumatic Eyes-  Sclera clear, conjunctiva pink Ears-  hearing intact Oropharynx- clear Neck- supple, flat JVP Lungs- Clear to ausculation bilaterally, normal work of breathing Heart- irregular rate and rhythm, no murmurs, rubs or gallops, PMI not laterally displaced GI- soft, NT, ND, + BS Extremities- no clubbing, cyanosis, 1+ BLE edema  Neuro- strength and sensation are intact  Assessment and Plan:  1. Permanent afib Doing well No active bleeding. On eliquis  2. Chronic diastolic dysfunction Stable No change required today She has venous insufficiency which makes fluid evaluation difficult.  She is not interested in support hose  3. HTN Stable No change required today  Return in 6 months to see Truitt Merle I will see in a year

## 2014-07-09 NOTE — Patient Instructions (Signed)
Your physician wants you to follow-up in: 6 months with Truitt Merle, NP and 12 months with Dr Vallery Ridge will receive a reminder letter in the mail two months in advance. If you don't receive a letter, please call our office to schedule the follow-up appointment.

## 2014-08-11 DIAGNOSIS — E780 Pure hypercholesterolemia: Secondary | ICD-10-CM | POA: Diagnosis not present

## 2014-08-11 DIAGNOSIS — I5020 Unspecified systolic (congestive) heart failure: Secondary | ICD-10-CM | POA: Diagnosis not present

## 2014-08-11 DIAGNOSIS — E118 Type 2 diabetes mellitus with unspecified complications: Secondary | ICD-10-CM | POA: Diagnosis not present

## 2014-08-11 DIAGNOSIS — E039 Hypothyroidism, unspecified: Secondary | ICD-10-CM | POA: Diagnosis not present

## 2014-08-11 DIAGNOSIS — I4891 Unspecified atrial fibrillation: Secondary | ICD-10-CM | POA: Diagnosis not present

## 2014-08-11 DIAGNOSIS — I10 Essential (primary) hypertension: Secondary | ICD-10-CM | POA: Diagnosis not present

## 2014-08-11 DIAGNOSIS — D510 Vitamin B12 deficiency anemia due to intrinsic factor deficiency: Secondary | ICD-10-CM | POA: Diagnosis not present

## 2014-08-16 ENCOUNTER — Ambulatory Visit: Payer: Medicare Other | Admitting: Podiatry

## 2014-08-23 ENCOUNTER — Ambulatory Visit (INDEPENDENT_AMBULATORY_CARE_PROVIDER_SITE_OTHER): Payer: Medicare Other | Admitting: Podiatry

## 2014-08-23 ENCOUNTER — Encounter: Payer: Self-pay | Admitting: Podiatry

## 2014-08-23 DIAGNOSIS — M79676 Pain in unspecified toe(s): Secondary | ICD-10-CM

## 2014-08-23 DIAGNOSIS — B351 Tinea unguium: Secondary | ICD-10-CM | POA: Diagnosis not present

## 2014-08-23 NOTE — Patient Instructions (Signed)

## 2014-08-23 NOTE — Progress Notes (Signed)
Patient ID: [name], female   DOB: [birth year], 81 y.o.   MRN: [id number]   Subjective This patient presents with her daughter today complaining of painful toenails and less debridement. Also, she presents for dispensing of custom diabetic insoles and diabetic shoes  Objective: The toenails are elongated, hypertrophic, incurvated, discolored and tender to palpation Diabetic shoes were not deep enough to accommodate patient's foot crackly  Assessment: Symptomatic onychomycoses Unsatisfactory fit of diabetic shoes  Plan: Debrided toenails 10 Reorder different shoe style shoe and notify patient upon receipt of replacement diabetic shoes

## 2014-08-25 ENCOUNTER — Emergency Department (HOSPITAL_COMMUNITY): Payer: Medicare Other

## 2014-08-25 ENCOUNTER — Inpatient Hospital Stay (HOSPITAL_COMMUNITY)
Admission: EM | Admit: 2014-08-25 | Discharge: 2014-08-31 | DRG: 388 | Disposition: A | Discharge status: Skilled Nursing Facility | Payer: Medicare Other | Attending: Internal Medicine | Admitting: Internal Medicine

## 2014-08-25 ENCOUNTER — Encounter (HOSPITAL_COMMUNITY): Payer: Self-pay | Admitting: Emergency Medicine

## 2014-08-25 DIAGNOSIS — Z8541 Personal history of malignant neoplasm of cervix uteri: Secondary | ICD-10-CM | POA: Diagnosis not present

## 2014-08-25 DIAGNOSIS — N179 Acute kidney failure, unspecified: Secondary | ICD-10-CM | POA: Diagnosis present

## 2014-08-25 DIAGNOSIS — I482 Chronic atrial fibrillation: Secondary | ICD-10-CM | POA: Diagnosis present

## 2014-08-25 DIAGNOSIS — R279 Unspecified lack of coordination: Secondary | ICD-10-CM | POA: Diagnosis not present

## 2014-08-25 DIAGNOSIS — K56609 Unspecified intestinal obstruction, unspecified as to partial versus complete obstruction: Secondary | ICD-10-CM | POA: Diagnosis present

## 2014-08-25 DIAGNOSIS — G4733 Obstructive sleep apnea (adult) (pediatric): Secondary | ICD-10-CM | POA: Diagnosis present

## 2014-08-25 DIAGNOSIS — Z87891 Personal history of nicotine dependence: Secondary | ICD-10-CM

## 2014-08-25 DIAGNOSIS — M6281 Muscle weakness (generalized): Secondary | ICD-10-CM | POA: Diagnosis not present

## 2014-08-25 DIAGNOSIS — R112 Nausea with vomiting, unspecified: Secondary | ICD-10-CM | POA: Diagnosis present

## 2014-08-25 DIAGNOSIS — K5669 Other intestinal obstruction: Secondary | ICD-10-CM | POA: Diagnosis not present

## 2014-08-25 DIAGNOSIS — Z853 Personal history of malignant neoplasm of breast: Secondary | ICD-10-CM

## 2014-08-25 DIAGNOSIS — K565 Intestinal adhesions [bands] with obstruction (postprocedural) (postinfection): Principal | ICD-10-CM | POA: Diagnosis present

## 2014-08-25 DIAGNOSIS — I5033 Acute on chronic diastolic (congestive) heart failure: Secondary | ICD-10-CM | POA: Diagnosis present

## 2014-08-25 DIAGNOSIS — C801 Malignant (primary) neoplasm, unspecified: Secondary | ICD-10-CM | POA: Diagnosis not present

## 2014-08-25 DIAGNOSIS — I4891 Unspecified atrial fibrillation: Secondary | ICD-10-CM | POA: Diagnosis not present

## 2014-08-25 DIAGNOSIS — E1165 Type 2 diabetes mellitus with hyperglycemia: Secondary | ICD-10-CM | POA: Diagnosis present

## 2014-08-25 DIAGNOSIS — E039 Hypothyroidism, unspecified: Secondary | ICD-10-CM | POA: Diagnosis present

## 2014-08-25 DIAGNOSIS — R1010 Upper abdominal pain, unspecified: Secondary | ICD-10-CM | POA: Diagnosis not present

## 2014-08-25 DIAGNOSIS — Z7901 Long term (current) use of anticoagulants: Secondary | ICD-10-CM

## 2014-08-25 DIAGNOSIS — I10 Essential (primary) hypertension: Secondary | ICD-10-CM | POA: Diagnosis present

## 2014-08-25 DIAGNOSIS — R2689 Other abnormalities of gait and mobility: Secondary | ICD-10-CM | POA: Diagnosis not present

## 2014-08-25 DIAGNOSIS — I5031 Acute diastolic (congestive) heart failure: Secondary | ICD-10-CM | POA: Diagnosis not present

## 2014-08-25 DIAGNOSIS — Z8543 Personal history of malignant neoplasm of ovary: Secondary | ICD-10-CM

## 2014-08-25 DIAGNOSIS — E785 Hyperlipidemia, unspecified: Secondary | ICD-10-CM | POA: Diagnosis present

## 2014-08-25 DIAGNOSIS — E860 Dehydration: Secondary | ICD-10-CM | POA: Diagnosis present

## 2014-08-25 DIAGNOSIS — R140 Abdominal distension (gaseous): Secondary | ICD-10-CM | POA: Diagnosis not present

## 2014-08-25 DIAGNOSIS — Z66 Do not resuscitate: Secondary | ICD-10-CM | POA: Diagnosis present

## 2014-08-25 DIAGNOSIS — R10814 Left lower quadrant abdominal tenderness: Secondary | ICD-10-CM | POA: Diagnosis not present

## 2014-08-25 DIAGNOSIS — I872 Venous insufficiency (chronic) (peripheral): Secondary | ICD-10-CM | POA: Diagnosis present

## 2014-08-25 DIAGNOSIS — Z8551 Personal history of malignant neoplasm of bladder: Secondary | ICD-10-CM

## 2014-08-25 DIAGNOSIS — R1114 Bilious vomiting: Secondary | ICD-10-CM | POA: Diagnosis not present

## 2014-08-25 DIAGNOSIS — R0602 Shortness of breath: Secondary | ICD-10-CM | POA: Diagnosis not present

## 2014-08-25 DIAGNOSIS — Z79899 Other long term (current) drug therapy: Secondary | ICD-10-CM | POA: Diagnosis not present

## 2014-08-25 DIAGNOSIS — K2970 Gastritis, unspecified, without bleeding: Secondary | ICD-10-CM | POA: Diagnosis not present

## 2014-08-25 DIAGNOSIS — R0600 Dyspnea, unspecified: Secondary | ICD-10-CM

## 2014-08-25 DIAGNOSIS — R109 Unspecified abdominal pain: Secondary | ICD-10-CM | POA: Diagnosis not present

## 2014-08-25 DIAGNOSIS — K5660 Unspecified intestinal obstruction: Secondary | ICD-10-CM | POA: Diagnosis not present

## 2014-08-25 HISTORY — DX: Unspecified intestinal obstruction, unspecified as to partial versus complete obstruction: K56.609

## 2014-08-25 LAB — CBC WITH DIFFERENTIAL/PLATELET
Basophils Absolute: 0 10*3/uL (ref 0.0–0.1)
Basophils Relative: 0 % (ref 0–1)
Eosinophils Absolute: 0 10*3/uL (ref 0.0–0.7)
Eosinophils Relative: 0 % (ref 0–5)
HCT: 50.7 % — ABNORMAL HIGH (ref 36.0–46.0)
Hemoglobin: 16.9 g/dL — ABNORMAL HIGH (ref 12.0–15.0)
LYMPHS ABS: 0.7 10*3/uL (ref 0.7–4.0)
LYMPHS PCT: 8 % — AB (ref 12–46)
MCH: 30.1 pg (ref 26.0–34.0)
MCHC: 33.3 g/dL (ref 30.0–36.0)
MCV: 90.4 fL (ref 78.0–100.0)
Monocytes Absolute: 0.4 10*3/uL (ref 0.1–1.0)
Monocytes Relative: 5 % (ref 3–12)
NEUTROS ABS: 7.2 10*3/uL (ref 1.7–7.7)
NEUTROS PCT: 87 % — AB (ref 43–77)
PLATELETS: 193 10*3/uL (ref 150–400)
RBC: 5.61 MIL/uL — AB (ref 3.87–5.11)
RDW: 14.6 % (ref 11.5–15.5)
WBC: 8.3 10*3/uL (ref 4.0–10.5)

## 2014-08-25 LAB — URINALYSIS, ROUTINE W REFLEX MICROSCOPIC
GLUCOSE, UA: NEGATIVE mg/dL
Hgb urine dipstick: NEGATIVE
Ketones, ur: NEGATIVE mg/dL
LEUKOCYTES UA: NEGATIVE
Nitrite: NEGATIVE
PH: 5 (ref 5.0–8.0)
Protein, ur: 100 mg/dL — AB
Specific Gravity, Urine: 1.024 (ref 1.005–1.030)
Urobilinogen, UA: 0.2 mg/dL (ref 0.0–1.0)

## 2014-08-25 LAB — URINE MICROSCOPIC-ADD ON

## 2014-08-25 LAB — COMPREHENSIVE METABOLIC PANEL
ALBUMIN: 4.6 g/dL (ref 3.5–5.2)
ALT: 20 U/L (ref 0–35)
ANION GAP: 13 (ref 5–15)
AST: 32 U/L (ref 0–37)
Alkaline Phosphatase: 78 U/L (ref 39–117)
BILIRUBIN TOTAL: 1.2 mg/dL (ref 0.3–1.2)
BUN: 30 mg/dL — ABNORMAL HIGH (ref 6–23)
CHLORIDE: 102 mmol/L (ref 96–112)
CO2: 25 mmol/L (ref 19–32)
Calcium: 9.9 mg/dL (ref 8.4–10.5)
Creatinine, Ser: 1.32 mg/dL — ABNORMAL HIGH (ref 0.50–1.10)
GFR calc Af Amer: 39 mL/min — ABNORMAL LOW (ref >90)
GFR calc non Af Amer: 34 mL/min — ABNORMAL LOW (ref >90)
Glucose, Bld: 138 mg/dL — ABNORMAL HIGH (ref 70–99)
POTASSIUM: 4.5 mmol/L (ref 3.5–5.1)
SODIUM: 140 mmol/L (ref 135–145)
Total Protein: 8 g/dL (ref 6.0–8.3)

## 2014-08-25 LAB — I-STAT CG4 LACTIC ACID, ED: Lactic Acid, Venous: 2.54 mmol/L (ref 0.5–2.0)

## 2014-08-25 LAB — HEPARIN LEVEL (UNFRACTIONATED)

## 2014-08-25 LAB — APTT: aPTT: 32 seconds (ref 24–37)

## 2014-08-25 LAB — TSH: TSH: 5.548 u[IU]/mL — ABNORMAL HIGH (ref 0.350–4.500)

## 2014-08-25 LAB — LIPASE, BLOOD: Lipase: 34 U/L (ref 11–59)

## 2014-08-25 LAB — TROPONIN I: Troponin I: <0.03 ng/mL (ref <0.031)

## 2014-08-25 MED ORDER — IOHEXOL 300 MG/ML  SOLN
25.0000 mL | INTRAMUSCULAR | Status: AC
  Administered 2014-08-25: 25 mL via ORAL

## 2014-08-25 MED ORDER — PROMETHAZINE HCL 25 MG/ML IJ SOLN
6.2500 mg | Freq: Once | INTRAMUSCULAR | Status: AC
  Administered 2014-08-25: 6.25 mg via INTRAVENOUS
  Filled 2014-08-25: qty 1

## 2014-08-25 MED ORDER — ONDANSETRON HCL 4 MG/2ML IJ SOLN
4.0000 mg | Freq: Four times a day (QID) | INTRAMUSCULAR | Status: DC | PRN
  Administered 2014-08-26: 4 mg via INTRAVENOUS
  Filled 2014-08-25: qty 2

## 2014-08-25 MED ORDER — FENTANYL CITRATE 0.05 MG/ML IJ SOLN: 100.0000 ug | INTRAMUSCULAR | Status: DC | PRN

## 2014-08-25 MED ORDER — DILTIAZEM HCL 25 MG/5ML IV SOLN
10.0000 mg | Freq: Once | INTRAVENOUS | Status: AC
  Administered 2014-08-25: 10 mg via INTRAVENOUS
  Filled 2014-08-25: qty 5

## 2014-08-25 MED ORDER — HEPARIN (PORCINE) IN NACL 100-0.45 UNIT/ML-% IJ SOLN
1100.0000 [IU]/h | INTRAMUSCULAR | Status: DC
  Administered 2014-08-25: 1000 [IU]/h via INTRAVENOUS
  Administered 2014-08-27: 1100 [IU]/h via INTRAVENOUS
  Filled 2014-08-25 (×5): qty 250

## 2014-08-25 MED ORDER — INSULIN ASPART 100 UNIT/ML ~~LOC~~ SOLN
0.0000 [IU] | SUBCUTANEOUS | Status: DC
  Administered 2014-08-28 – 2014-08-30 (×2): 1 [IU] via SUBCUTANEOUS

## 2014-08-25 MED ORDER — SODIUM CHLORIDE 0.9 % IV SOLN
INTRAVENOUS | Status: DC
  Administered 2014-08-26: via INTRAVENOUS

## 2014-08-25 MED ORDER — SODIUM CHLORIDE 0.9 % IV SOLN: INTRAVENOUS | Status: AC

## 2014-08-25 MED ORDER — ONDANSETRON HCL 4 MG/2ML IJ SOLN
4.0000 mg | Freq: Once | INTRAMUSCULAR | Status: AC
  Administered 2014-08-25: 4 mg via INTRAVENOUS
  Filled 2014-08-25: qty 2

## 2014-08-25 MED ORDER — ACETAMINOPHEN 325 MG PO TABS: 650.0000 mg | ORAL_TABLET | Freq: Four times a day (QID) | ORAL | Status: DC | PRN

## 2014-08-25 MED ORDER — LEVOTHYROXINE SODIUM 100 MCG IV SOLR
56.0000 ug | Freq: Every day | INTRAVENOUS | Status: DC
  Administered 2014-08-25 – 2014-08-27 (×3): 56 ug via INTRAVENOUS
  Filled 2014-08-25 (×5): qty 5

## 2014-08-25 MED ORDER — HEPARIN BOLUS VIA INFUSION
3500.0000 [IU] | Freq: Once | INTRAVENOUS | Status: AC
  Administered 2014-08-25: 3500 [IU] via INTRAVENOUS
  Filled 2014-08-25: qty 3500

## 2014-08-25 MED ORDER — ASPIRIN 300 MG RE SUPP
300.0000 mg | Freq: Every day | RECTAL | Status: DC
  Administered 2014-08-25 – 2014-08-26 (×2): 300 mg via RECTAL
  Filled 2014-08-25 (×3): qty 1

## 2014-08-25 MED ORDER — SODIUM CHLORIDE 0.9 % IV SOLN
Freq: Once | INTRAVENOUS | Status: AC
  Administered 2014-08-25: 12:00:00 via INTRAVENOUS

## 2014-08-25 MED ORDER — SODIUM CHLORIDE 0.9 % IJ SOLN
3.0000 mL | Freq: Two times a day (BID) | INTRAMUSCULAR | Status: DC
Start: 2014-08-25 — End: 2014-08-31
  Administered 2014-08-30 – 2014-08-31 (×3): 3 mL via INTRAVENOUS

## 2014-08-25 MED ORDER — ONDANSETRON HCL 4 MG PO TABS: 4.0000 mg | ORAL_TABLET | Freq: Four times a day (QID) | ORAL | Status: DC | PRN

## 2014-08-25 MED ORDER — METOPROLOL TARTRATE 1 MG/ML IV SOLN
5.0000 mg | Freq: Four times a day (QID) | INTRAVENOUS | Status: DC
  Administered 2014-08-25 – 2014-08-27 (×7): 5 mg via INTRAVENOUS
  Filled 2014-08-25 (×12): qty 5

## 2014-08-25 MED ORDER — ACETAMINOPHEN 650 MG RE SUPP: 650.0000 mg | Freq: Four times a day (QID) | RECTAL | Status: DC | PRN

## 2014-08-25 NOTE — ED Notes (Signed)
NOTIFIED DR. LOCKWOOD FOR PATIENTS LAB RESULTS OF CG4+LACTIC ACID @11 :07AM ,08/25/2014.

## 2014-08-25 NOTE — ED Notes (Signed)
MD at bedside. 

## 2014-08-25 NOTE — ED Provider Notes (Signed)
CSN: [id number]     Arrival date & time 08/25/14  4580 History   First MD Initiated Contact with Patient 08/25/14 [phone]     Chief Complaint  Patient presents with  . Emesis  . Abdominal Pain    HPI Patient presents with ongoing nausea, vomiting, upper abdominal discomfort. This episode began about 3 days ago.  This episode is similar with multiple prior events, during which she has had bowel obstruction. Prior to this episode she was generally well. Since onset she has had persistent nausea, diminished bowel movements, occasional vomiting. Abdominal pain is intermittent, dull, persistent in the upper belly. No fever, confusion, disorientation, chest pain. Minimal relief with anything. No clear exacerbating factors. Patient has been evaluated multiple times for her recurrent bowel obstruction, states that she is no longer interested in surgical revision, if required. Past Medical History  Diagnosis Date  . Hypertension   . SBO (small bowel obstruction)   . Permanent atrial fibrillation     a. coumadin d/c'd => Pradaxa in 05/2012  . Chronic diastolic heart failure     a. Echo 5/12: Mild LVH, EF 55-60%, mild AI, mild MR, severe LAE, mild RAE, PASP 31, small pericardial effusion  . Venous insufficiency     chronic LE edema  . HLD (hyperlipidemia)   . OSA (obstructive sleep apnea)   . Hypothyroidism   . Diastolic CHF, chronic 3/82/5053    Class 2b-3 2 d echo 5/12 mild LVH EF 55-50%, MILD AI, MILD MR, SEVERE LAE, mild RAE, PASP 31, SMALL PERICRADIAL EFFUSION  . Hx of cervical cancer 11/11/2012  . S/P hysterectomy 11/11/2012  . DM2 (diabetes mellitus, type 2)   . Colon cancer     "polyp" (12/08/2012)  . Breast cancer   . Cervical cancer   . Ovarian cancer   . Bladder cancer   . H/O ovarian cancer 11/11/2012     MUCINOUS CYST S/P RESECTION 35 YEARS AGO  . Hx of bladder cancer 11/11/2012   Past Surgical History  Procedure Laterality Date  . Bladder surgery    . Appendectomy    .  Vaginal hysterectomy    . Exploratory laparotomy with abdominal mass excision      "21# ovarian tumor; benign" (12/08/2012)  . Cataract extraction w/ intraocular lens  implant, bilateral    . Mastectomy, radical Left   . Breast lumpectomy Right   . Breast biopsy Bilateral   . I&d extremity Right 12/31/2012    Procedure: IRRIGATION AND DEBRIDEMENT RIGHT KNEE ULCER WITH PLACEMENT OF A CELL AND VAC ;  Surgeon: Theodoro Kos, DO;  Location: [facility];  Service: Plastics;  Laterality: Right;   No family history on file. History  Substance Use Topics  . Smoking status: Former Smoker -- 3.00 packs/day for 25 years    Types: Cigarettes    Quit date: 07/02/1966  . Smokeless tobacco: Never Used  . Alcohol Use: No   OB History    No data available     Review of Systems  Constitutional:       Per HPI, otherwise negative  HENT:       Per HPI, otherwise negative  Respiratory:       Per HPI, otherwise negative  Cardiovascular:       Per HPI, otherwise negative  Gastrointestinal: Positive for vomiting, abdominal pain and constipation.  Endocrine:       Negative aside from HPI  Genitourinary:       Neg aside from HPI  Musculoskeletal:       Per HPI, otherwise negative  Skin: Negative.   Neurological: Negative for syncope.      Allergies  Codeine; Morphine and related; Statins; and Zetia  Home Medications   Prior to Admission medications   Medication Sig Start Date End Date Taking? Authorizing Provider  apixaban (ELIQUIS) 5 MG TABS tablet Take 1 tablet (5 mg total) by mouth 2 (two) times daily. 01/18/14  Yes Thompson Grayer, MD  bisacodyl (DULCOLAX) 5 MG EC tablet Take 5 mg by mouth daily.    Yes Historical Provider, MD  Cyanocobalamin (VITAMIN B-12 IJ) Inject 1 mL as directed every 30 (thirty) days.    Yes Historical Provider, MD  diltiazem (TIAZAC) 360 MG 24 hr capsule Take 360 mg by mouth daily.   Yes Historical Provider, MD  ferrous sulfate 325 (65 FE) MG tablet Take 325 mg by mouth  daily with breakfast.   Yes Historical Provider, MD  furosemide (LASIX) 40 MG tablet Take 1 tablet (40 mg total) by mouth daily. 01/18/14  Yes Thompson Grayer, MD  levothyroxine (SYNTHROID, LEVOTHROID) 112 MCG tablet Take 224 mcg by mouth daily before breakfast.   Yes Historical Provider, MD  potassium chloride SA (K-DUR,KLOR-CON) 20 MEQ tablet Take 20 mEq by mouth daily.   Yes Historical Provider, MD  PRESCRIPTION MEDICATION Apply 1 application topically daily as needed. Apply cream under breasts for rash daily.   Yes Historical Provider, MD  ramipril (ALTACE) 10 MG capsule Take 1 capsule (10 mg total) by mouth daily. 03/23/13  Yes Thompson Grayer, MD   BP 131/79 mmHg  Pulse 99  Temp(Src) 97.8 F (36.6 C) (Oral)  Resp 19  SpO2 94% Physical Exam  Constitutional: She is oriented to person, place, and time. She appears well-developed and well-nourished. No distress.  HENT:  Head: Normocephalic and atraumatic.  Eyes: Conjunctivae and EOM are normal.  Cardiovascular: An irregularly irregular rhythm present. Tachycardia present.   Pulmonary/Chest: Effort normal and breath sounds normal. No stridor. No respiratory distress.  Abdominal: She exhibits no distension. There is no tenderness. There is no rebound.  Musculoskeletal: She exhibits no edema.  Neurological: She is alert and oriented to person, place, and time. No cranial nerve deficit.  Skin: Skin is warm and dry.  Psychiatric: She has a normal mood and affect.  Nursing note and vitals reviewed.   ED Course  Procedures (including critical care time) Labs Review Labs Reviewed  CBC WITH DIFFERENTIAL/PLATELET - Abnormal; Notable for the following:    RBC 5.61 (*)    Hemoglobin 16.9 (*)    HCT 50.7 (*)    Neutrophils Relative % 87 (*)    Lymphocytes Relative 8 (*)    All other components within normal limits  COMPREHENSIVE METABOLIC PANEL - Abnormal; Notable for the following:    Glucose, Bld 138 (*)    BUN 30 (*)    Creatinine, Ser 1.32  (*)    GFR calc non Af Amer 34 (*)    GFR calc Af Amer 39 (*)    All other components within normal limits  URINALYSIS, ROUTINE W REFLEX MICROSCOPIC - Abnormal; Notable for the following:    Color, Urine AMBER (*)    Bilirubin Urine SMALL (*)    Protein, ur 100 (*)    All other components within normal limits  URINE MICROSCOPIC-ADD ON - Abnormal; Notable for the following:    Squamous Epithelial / LPF FEW (*)    Bacteria, UA FEW (*)  Casts HYALINE CASTS (*)    All other components within normal limits  I-STAT CG4 LACTIC ACID, ED - Abnormal; Notable for the following:    Lactic Acid, Venous 2.54 (*)    All other components within normal limits  LIPASE, BLOOD    Imaging Review Ct Abdomen Pelvis Wo Contrast  08/25/2014   CLINICAL DATA:  Sharp abdominal pain and vomiting  EXAM: CT ABDOMEN AND PELVIS WITHOUT CONTRAST  TECHNIQUE: Multidetector CT imaging of the abdomen and pelvis was performed following the standard protocol without IV contrast.  COMPARISON:  11/11/2012  FINDINGS: The lung bases are free of acute infiltrate or sizable effusion. A small pericardial effusion is noted. The cardiac shadow is enlarged.  The liver, gallbladder, spleen, adrenal glands and pancreas are within normal limits. The kidneys are within normal limits without evidence of calculi or obstructive change. 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  Significant distension of the stomach and proximal small bowel is identified. This continues distally to the right lower quadrant and on image number 46 of series 2 there is an abrupt transition identified with more normal small bowel noted beyond this region. This area lies in an area of previous scar in the anterior abdominal wall and is likely related to adhesions. The more distal small bowel as well as the colon appear within normal limits.  The bladder is partially distended. The  uterus and appendix have been surgically removed. Minimal free fluid is noted within the pelvis. Diffuse aortoiliac calcifications are seen without aneurysmal dilatation. Degenerative changes of the lumbar spine are seen. No acute compression deformity is noted.  IMPRESSION: Significant distention of the stomach and small bowel to an area in the right lower quadrant. The overlying skin is attenuated likely related to prior postsurgical scar. This obstructive changes likely related to postoperative adhesions.  Although not mentioned in the body of the report the majority of the small bowel lies within the right abdomen likely related to partial malrotation. These changes are stable from a prior study.  No other acute abnormality is seen.   Electronically Signed   By: Inez Catalina M.D.   On: 08/25/2014 14:03     EKG Interpretation   Date/Time:  Wednesday August 25 2014 09:49:57 EST Ventricular Rate:  103 PR Interval:    QRS Duration: 85 QT Interval:  350 QTC Calculation: 458 R Axis:   70 Text Interpretation:  Atrial fibrillation Anterior infarct, old  Nonspecific T abnormalities, inferior leads Atrial fibrillation Abnormal  ekg Confirmed by Carmin Muskrat  MD [phone]) on 08/25/2014 9:55:43 AM     Cardiac 101 A. fib abnormal Pulse ox 99% room air normal   After the initial evaluation I reviewed the patient's chart, including her most recent health care visit, which was with podiatry. Patient has a history of recurrent bowel obstructions, as well as multiple cancer surgeries.  Update:LA - 2.54   Update:Patient still nauseous.  She has been intolerant of PO contrast.  2:51 PM Patient asleep  MDM   Elderly female with recurrent bowel obstructions presents with abdominal pain, nausea, vomiting. Here the patient has CT evidence of obstruction, as well as acute kidney injury Patient's symptoms improved with multiple doses of antiemetics, fluid resuscitation. Patient confirmed, as did her  daughter that she is not interested in any surgical intervention for bowel obstruction, but, recently, patient has successfully had resolution with medical  management multiple times in the past. Patient was admitted for further evaluation and management.    Carmin Muskrat, MD 08/25/14 1452

## 2014-08-25 NOTE — ED Notes (Signed)
Per EMS, pt coming from home for sharp abd pain 5 out of 10 per pt. EMS reports vomiting x3 days. NAD at this time.

## 2014-08-25 NOTE — H&P (Signed)
[facility] History and Physical                                                                                    [name], is a 81 y.o. female  MRN: [id number]   DOB - Jun 11, 1920  Admit Date - 08/25/2014  Outpatient Primary MD for the patient is [name], MD  With History of -  Past Medical History  Diagnosis Date  . Hypertension   . SBO (small bowel obstruction)   . Permanent atrial fibrillation     a. coumadin d/c'd => Pradaxa in 05/2012  . Chronic diastolic heart failure     a. Echo 5/12: Mild LVH, EF 55-60%, mild AI, mild MR, severe LAE, mild RAE, PASP 31, small pericardial effusion  . Venous insufficiency     chronic LE edema  . HLD (hyperlipidemia)   . OSA (obstructive sleep apnea)   . Hypothyroidism   . Diastolic CHF, chronic 2/68/3419    Class 2b-3 2 d echo 5/12 mild LVH EF 55-50%, MILD AI, MILD MR, SEVERE LAE, mild RAE, PASP 31, SMALL PERICRADIAL EFFUSION  . Hx of cervical cancer 11/11/2012  . S/P hysterectomy 11/11/2012  . DM2 (diabetes mellitus, type 2)   . Colon cancer     "polyp" (12/08/2012)  . Breast cancer   . Cervical cancer   . Ovarian cancer   . Bladder cancer   . H/O ovarian cancer 11/11/2012     MUCINOUS CYST S/P RESECTION 35 YEARS AGO  . Hx of bladder cancer 11/11/2012      Past Surgical History  Procedure Laterality Date  . Bladder surgery    . Appendectomy    . Vaginal hysterectomy    . Exploratory laparotomy with abdominal mass excision      "21# ovarian tumor; benign" (12/08/2012)  . Cataract extraction w/ intraocular lens  implant, bilateral    . Mastectomy, radical Left   . Breast lumpectomy Right   . Breast biopsy Bilateral   . I&d extremity Right 12/31/2012    Procedure: IRRIGATION AND DEBRIDEMENT RIGHT KNEE ULCER WITH PLACEMENT OF A CELL AND VAC ;  Surgeon: Theodoro Kos, DO;  Location: [facility];  Service: Plastics;  Laterality: Right;    in for   Chief Complaint  Patient presents with  . Emesis  . Abdominal Pain      HPI  [name]  is a 81 y.o. female, with a past medical history of 16 bowel obstructions in the past 5 years, and diastolic heart failure, A. fib, ovarian cancer, bladder cancer, and obstructive sleep apnea on C Pap. She presents to the ER with about obstruction. Her daughter who is a former O. R. nurse at [employer], reports that she began vomiting yesterday afternoon. The vomiting has been persistent she is unable to keep down even liquids. She has not had a bowel movement in several days. Her daughter states that the patient absolutely does not want surgery her previous bowel obstructions have been managed conservatively. She also states that her mother had a bad GI bleed from her previous nasogastric tube, so if possible they would like to avoid an NG tube, she needs  it.  She is normally on Eliquis and rate control medications for A. fib. Unfortunately in the ER she appears to be in A. fib with RVR. Per her daughter the patient is a DO NOT RESUSCITATE.  CAT scan of the abdomen and pelvis shows small bowel obstruction, she is dehydrated and has mild acute renal failure with an elevation in her creatinine from 0.8 to 1.32. Lactic acid is 2.54.   Review of Systems   In addition to the HPI above,  No Fever-chills, No Headache, No changes with Vision or hearing, No problems swallowing food or Liquids, No Chest pain, Cough or Shortness of Breath, No Blood in stool or Urine, No dysuria, No new skin rashes or bruises, No new joints pains-aches,  No new weakness, tingling, numbness in any extremity, No recent weight gain or loss, A full 10 point Review of Systems was done, except as stated above, all other Review of Systems were negative.  Social History History  Substance Use Topics  . Smoking status: Former Smoker -- 3.00 packs/day for 25 years    Types: Cigarettes    Quit date: 07/02/1966  . Smokeless tobacco: Never Used  . Alcohol Use: No    Family History  the patient herself is a  had a history of malignant polyps. There is no known history of colon cancer.   Prior to Admission medications   Medication Sig Start Date End Date Taking? Authorizing Provider  apixaban (ELIQUIS) 5 MG TABS tablet Take 1 tablet (5 mg total) by mouth 2 (two) times daily. 01/18/14  Yes Thompson Grayer, MD  bisacodyl (DULCOLAX) 5 MG EC tablet Take 5 mg by mouth daily.    Yes Historical Provider, MD  Cyanocobalamin (VITAMIN B-12 IJ) Inject 1 mL as directed every 30 (thirty) days.    Yes Historical Provider, MD  diltiazem (TIAZAC) 360 MG 24 hr capsule Take 360 mg by mouth daily.   Yes Historical Provider, MD  ferrous sulfate 325 (65 FE) MG tablet Take 325 mg by mouth daily with breakfast.   Yes Historical Provider, MD  furosemide (LASIX) 40 MG tablet Take 1 tablet (40 mg total) by mouth daily. 01/18/14  Yes Thompson Grayer, MD  levothyroxine (SYNTHROID, LEVOTHROID) 112 MCG tablet Take 224 mcg by mouth daily before breakfast.   Yes Historical Provider, MD  potassium chloride SA (K-DUR,KLOR-CON) 20 MEQ tablet Take 20 mEq by mouth daily.   Yes Historical Provider, MD  PRESCRIPTION MEDICATION Apply 1 application topically daily as needed. Apply cream under breasts for rash daily.   Yes Historical Provider, MD  ramipril (ALTACE) 10 MG capsule Take 1 capsule (10 mg total) by mouth daily. 03/23/13  Yes Thompson Grayer, MD    Allergies  Allergen Reactions  . Codeine     unknown  . Morphine And Related     sick  . Statins     sick  . Zetia [Ezetimibe] Other (See Comments)    Side effect to strong     Physical Exam  Vitals  Blood pressure 162/78, pulse 102, temperature 97.8 F (36.6 C), temperature source Oral, resp. rate 22, height 5\' 1"  (1.549 m), weight 97.4 kg (214 lb 11.7 oz), SpO2 94 %.   General: Elderly, ill-appearing female, sleepy lying in bed, daughter bedtime   NT:  Ears and Eyes appear Normal, Conjunctivae clear, PER. Moist oral mucosa without erythema or exudates.  Neck:  Supple, No  lymphadenopathy appreciated  Respiratory:  Symmetrical chest wall movement, Good air movement bilaterally, CTAB.  Cardiac: Difficult to hear, irregular, No Murmurs, 1+ pitting in LE bilaterally, no JVD.    Abdomen:  Obese, Positive bowel sounds, Soft,  Tender to palpation, particularly on the left, Non distended,  No masses appreciated  Skin:  No Cyanosis, Normal Skin Turgor, No Skin Rash or Bruise.  Extremities:  Able to move all 4. 5/5 strength in each,  no effusions.  Data Review  CBC  Recent Labs Lab 08/25/14 1023  WBC 8.3  HGB 16.9*  HCT 50.7*  PLT 193  MCV 90.4  MCH 30.1  MCHC 33.3  RDW 14.6  LYMPHSABS 0.7  MONOABS 0.4  EOSABS 0.0  BASOSABS 0.0    Chemistries   Recent Labs Lab 08/25/14 1023  NA 140  K 4.5  CL 102  CO2 25  GLUCOSE 138*  BUN 30*  CREATININE 1.32*  CALCIUM 9.9  AST 32  ALT 20  ALKPHOS 78  BILITOT 1.2     Urinalysis    Component Value Date/Time   COLORURINE AMBER* 08/25/2014 1246   APPEARANCEUR CLEAR 08/25/2014 1246   LABSPEC 1.024 08/25/2014 1246   PHURINE 5.0 08/25/2014 1246   GLUCOSEU NEGATIVE 08/25/2014 1246   HGBUR NEGATIVE 08/25/2014 1246   BILIRUBINUR SMALL* 08/25/2014 1246   KETONESUR NEGATIVE 08/25/2014 1246   PROTEINUR 100* 08/25/2014 1246   UROBILINOGEN 0.2 08/25/2014 1246   NITRITE NEGATIVE 08/25/2014 1246   LEUKOCYTESUR NEGATIVE 08/25/2014 1246    Imaging results:   Ct Abdomen Pelvis Wo Contrast  08/25/2014   CLINICAL DATA:  Sharp abdominal pain and vomiting  EXAM: CT ABDOMEN AND PELVIS WITHOUT CONTRAST  TECHNIQUE: Multidetector CT imaging of the abdomen and pelvis was performed following the standard protocol without IV contrast.  COMPARISON:  11/11/2012  FINDINGS: The lung bases are free of acute infiltrate or sizable effusion. A small pericardial effusion is noted. The cardiac shadow is enlarged.  The liver, gallbladder, spleen, adrenal glands and pancreas are within normal limits. The kidneys are within  normal limits without evidence of calculi or obstructive change. 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  Significant distension of the stomach and proximal small bowel is identified. This continues distally to the right lower quadrant and on image number 46 of series 2 there is an abrupt transition identified with more normal small bowel noted beyond this region. This area lies in an area of previous scar in the anterior abdominal wall and is likely related to adhesions. The more distal small bowel as well as the colon appear within normal limits.  The bladder is partially distended. The uterus and appendix have been surgically removed. Minimal free fluid is noted within the pelvis. Diffuse aortoiliac calcifications are seen without aneurysmal dilatation. Degenerative changes of the lumbar spine are seen. No acute compression deformity is noted.  IMPRESSION: Significant distention of the stomach and small bowel to an area in the right lower quadrant. The overlying skin is attenuated likely related to prior postsurgical scar. This obstructive changes likely related to postoperative adhesions.  Although not mentioned in the body of the report the majority of the small bowel lies within the right abdomen likely related to partial malrotation. These changes are stable from a prior study.  No other acute abnormality is seen.   Electronically Signed   By: Inez Catalina M.D.   On: 08/25/2014 14:03    My personal review of EKG: A. fib  Assessment & Plan  Principal Problem:   Bowel obstruction Active Problems:   SBO (small bowel obstruction)   Nausea & vomiting   Hypothyroidism   Hyperlipidemia   OSA (obstructive sleep apnea)   HX: breast cancer   H/O ovarian cancer   Hx of bladder cancer   Essential hypertension   Small bowel obstruction   Atrial fibrillation with RVR   Acute renal failure   Diastolic heart  failure  Small bowel obstruction   due to adhesion and known anatomical abnormality. Conservative management with bowel rest. Place NG only if absolutely needed. Maintain potassium between 4.5-5.  Diastolic heart failure Patient is currently dehydrated but does have some minimal swelling in her lower extremities. Will give IV fluids while she is nothing by mouth. And monitor volume status closely. Last echo 6/15, LVEF 50-55%.  Acute renal failure Secondary to dehydration and vomiting. We'll give gentle IV fluids and monitor creatinine.  A. fib with RVR Gave Cardizem injection in the ER. We'll continue with IV metoprolol Q6 hours. Heparin per pharmacy. Resume Eliquis when she is able to take by mouth's  Hyperglycemia Sliding scale insulin sensitive with every 4 hours CBGs.  Check a hemoglobin A1c.  Chest pain The patient had a complaint of chest pain while I was examining her.  We'll cycle troponins.  Hypothyroidism IV Synthroid.  Obstructive sleep apnea C Pap daily at bedtime.  Family reports patient is a high fall risk. She ambulates using a walker at home but is very wobbly. Physical therapy evaluation has been ordered.  DVT Prophylaxis:  Heparin. Normally on Eliquis but currently nothing by mouth  AM Labs Ordered, also please review Full Orders  Family Communication:   Daughter at bedside  Code Status:  DO NOT RESUSCITATE  Condition:  Guarded  Time spent in minutes : [place],  PA-C on 08/25/2014 at 5:46 PM  Between 7am to 7pm - Pager - [phone]  After 7pm go to [email or web address] - password [email or web address]  And look for the night coverage person covering me after hours  [facility]

## 2014-08-25 NOTE — ED Notes (Signed)
Pt not tolerating PO contrast, CT notified and Dr. Vanita Panda notified.

## 2014-08-25 NOTE — Progress Notes (Signed)
ANTICOAGULATION CONSULT NOTE - Initial Consult  Pharmacy Consult for Heparin while NPO Indication: atrial fibrillation  Allergies  Allergen Reactions  . Codeine     unknown  . Morphine And Related     sick  . Statins     sick  . Zetia [Ezetimibe] Other (See Comments)    Side effect to strong     Patient Measurements: Height: 5\' 1"  (154.9 cm) Weight: 214 lb 11.7 oz (97.4 kg) IBW/kg (Calculated) : 47.8 Heparin Dosing Weight: 71 kg  Vital Signs: Temp: 97.8 F (36.6 C) (02/24 0950) Temp Source: Oral (02/24 0950) BP: 148/87 mmHg (02/24 1523) Pulse Rate: 99 (02/24 1336)  Labs:  Recent Labs  08/25/14 1023  HGB 16.9*  HCT 50.7*  PLT 193  CREATININE 1.32*    CrCl cannot be calculated (Unknown ideal weight.).   Medical History: Past Medical History  Diagnosis Date  . Hypertension   . SBO (small bowel obstruction)   . Permanent atrial fibrillation     a. coumadin d/c'd => Pradaxa in 05/2012  . Chronic diastolic heart failure     a. Echo 5/12: Mild LVH, EF 55-60%, mild AI, mild MR, severe LAE, mild RAE, PASP 31, small pericardial effusion  . Venous insufficiency     chronic LE edema  . HLD (hyperlipidemia)   . OSA (obstructive sleep apnea)   . Hypothyroidism   . Diastolic CHF, chronic 1/82/9937    Class 2b-3 2 d echo 5/12 mild LVH EF 55-50%, MILD AI, MILD MR, SEVERE LAE, mild RAE, PASP 31, SMALL PERICRADIAL EFFUSION  . Hx of cervical cancer 11/11/2012  . S/P hysterectomy 11/11/2012  . DM2 (diabetes mellitus, type 2)   . Colon cancer     "polyp" (12/08/2012)  . Breast cancer   . Cervical cancer   . Ovarian cancer   . Bladder cancer   . H/O ovarian cancer 11/11/2012     MUCINOUS CYST S/P RESECTION 35 YEARS AGO  . Hx of bladder cancer 11/11/2012    Medications:   (Not in a hospital admission) Scheduled:   Infusions:  . diltiazem      Assessment: 81yo female with history of Afib on apixaban presents with emesis and abdominal pain. Pharmacy is consulted to  dose heparin for Afib while patient is NPO. Hgb 16.9, Plt 193, sCr 1.32. Baseline aPTT and HL have been ordered. Will need to use aPTT until apixaban no longer impacts HL.  Goal of Therapy:  Heparin level 0.3-0.7 units/ml aPTT 66-102 seconds Monitor platelets by anticoagulation protocol: Yes   Plan:  Give 3500 units bolus x 1 Start heparin infusion at 1000 units/hr Check anti-Xa level in 8 hours and daily while on heparin Continue to monitor H&H and platelets  Monitor s/sx of bleeding  Andrey Cota. Diona Foley, PharmD Clinical Pharmacist Pager [phone] 08/25/2014,3:42 PM

## 2014-08-25 NOTE — ED Notes (Signed)
I gave the patients family 2 mouth swab packs.

## 2014-08-25 NOTE — ED Notes (Signed)
IV team at the bedside. 

## 2014-08-26 ENCOUNTER — Inpatient Hospital Stay (HOSPITAL_COMMUNITY): Payer: Medicare Other

## 2014-08-26 LAB — CBC
HEMATOCRIT: 44.8 % (ref 36.0–46.0)
HEMOGLOBIN: 14.7 g/dL (ref 12.0–15.0)
MCH: 30.4 pg (ref 26.0–34.0)
MCHC: 32.8 g/dL (ref 30.0–36.0)
MCV: 92.8 fL (ref 78.0–100.0)
Platelets: 149 10*3/uL — ABNORMAL LOW (ref 150–400)
RBC: 4.83 MIL/uL (ref 3.87–5.11)
RDW: 14.9 % (ref 11.5–15.5)
WBC: 8 10*3/uL (ref 4.0–10.5)

## 2014-08-26 LAB — HEPATIC FUNCTION PANEL
ALK PHOS: 61 U/L (ref 39–117)
ALT: 15 U/L (ref 0–35)
AST: 25 U/L (ref 0–37)
Albumin: 3.7 g/dL (ref 3.5–5.2)
BILIRUBIN DIRECT: 0.3 mg/dL (ref 0.0–0.5)
BILIRUBIN TOTAL: 1.1 mg/dL (ref 0.3–1.2)
Indirect Bilirubin: 0.8 mg/dL (ref 0.3–0.9)
Total Protein: 6.9 g/dL (ref 6.0–8.3)

## 2014-08-26 LAB — GLUCOSE, CAPILLARY
GLUCOSE-CAPILLARY: 110 mg/dL — AB (ref 70–99)
GLUCOSE-CAPILLARY: 108 mg/dL — AB (ref 70–99)
GLUCOSE-CAPILLARY: 110 mg/dL — AB (ref 70–99)
GLUCOSE-CAPILLARY: 107 mg/dL — AB (ref 70–99)
Glucose-Capillary: 107 mg/dL — ABNORMAL HIGH (ref 70–99)
Glucose-Capillary: 115 mg/dL — ABNORMAL HIGH (ref 70–99)
Glucose-Capillary: 123 mg/dL — ABNORMAL HIGH (ref 70–99)
Glucose-Capillary: 129 mg/dL — ABNORMAL HIGH (ref 70–99)
Glucose-Capillary: 95 mg/dL (ref 70–99)

## 2014-08-26 LAB — BASIC METABOLIC PANEL
ANION GAP: 10 (ref 5–15)
BUN: 45 mg/dL — ABNORMAL HIGH (ref 6–23)
CALCIUM: 8.9 mg/dL (ref 8.4–10.5)
CO2: 23 mmol/L (ref 19–32)
CREATININE: 1.26 mg/dL — AB (ref 0.50–1.10)
Chloride: 108 mmol/L (ref 96–112)
GFR calc non Af Amer: 36 mL/min — ABNORMAL LOW (ref >90)
GFR, EST AFRICAN AMERICAN: 42 mL/min — AB (ref >90)
Glucose, Bld: 112 mg/dL — ABNORMAL HIGH (ref 70–99)
Potassium: 4 mmol/L (ref 3.5–5.1)
Sodium: 141 mmol/L (ref 135–145)

## 2014-08-26 LAB — APTT
aPTT: 89 seconds — ABNORMAL HIGH (ref 24–37)
aPTT: 45 seconds — ABNORMAL HIGH (ref 24–37)
aPTT: 115 seconds — ABNORMAL HIGH (ref 24–37)

## 2014-08-26 LAB — HEPARIN LEVEL (UNFRACTIONATED): Heparin Unfractionated: >2.2 IU/mL — ABNORMAL HIGH (ref 0.30–0.70)

## 2014-08-26 LAB — TROPONIN I
TROPONIN I: 0.03 ng/mL (ref <0.031)
Troponin I: <0.03 ng/mL (ref <0.031)

## 2014-08-26 MED ORDER — SODIUM CHLORIDE 0.9 % IV SOLN
INTRAVENOUS | Status: DC
  Administered 2014-08-26: 07:00:00 via INTRAVENOUS

## 2014-08-26 MED ORDER — SODIUM CHLORIDE 0.9 % IV SOLN
INTRAVENOUS | Status: AC
  Administered 2014-08-26 (×2): via INTRAVENOUS

## 2014-08-26 MED ORDER — BISACODYL 10 MG RE SUPP
10.0000 mg | Freq: Once | RECTAL | Status: AC
  Administered 2014-08-26: 10 mg via RECTAL
  Filled 2014-08-26: qty 1

## 2014-08-26 NOTE — Plan of Care (Signed)
Problem: Phase I Progression Outcomes Goal: OOB as tolerated unless otherwise ordered Outcome: Progressing Up in chair today      

## 2014-08-26 NOTE — Progress Notes (Addendum)
[facility] PROGRESS NOTE  [name] RDE:[id number] DOB: [birth year] DOA: 08/25/2014 PCP: [name], MD  Assessment/Plan: Principal Problem:   Bowel obstruction Active Problems:   SBO (small bowel obstruction)   Nausea & vomiting   Hypothyroidism   Hyperlipidemia   OSA (obstructive sleep apnea)   HX: breast cancer   H/O ovarian cancer   Hx of bladder cancer   Essential hypertension   Small bowel obstruction   Atrial fibrillation with RVR   Acute renal failure   Diastolic heart failure    Small bowel obstruction  due to adhesion and known anatomical abnormality. Conservative management with bowel rest. Cont  NG and monitor output over next 24 hrs  KUB today ordered-improved  Maintain potassium between 4.5-5. KUB  again in AM ,dulcolax today  Diastolic heart failure Patient is currently dehydrated but does have some minimal swelling in her lower extremities. Cont  IV fluids while she is nothing by mouth. And monitor volume status closely. Last echo 6/15, LVEF 50-55%.  Acute renal failure-improving Secondary to dehydration and vomiting. Cont  IV fluids at 125cc/hr   A. fib with RVR Cont iv metoprolol rn  We'll continue with IV metoprolol Q6 hours. Heparin per pharmacy. Holding  Eliquis when she is able to take by mouth's  Hyperglycemia Sliding scale insulin sensitive with every 4 hours CBGs. pending hemoglobin A1c.  Chest pain Likely GI in origin .  Negative  Troponins.start Protonix   Hypothyroidism IV Synthroid.  Obstructive sleep apnea C Pap daily at bedtime.  Code Status:DNR  Family Communication: chris [phone] updated  Disposition Plan:  As above    Brief narrative: [name] is a 81 y.o. female, with a past medical history of 16 bowel obstructions in the past 5 years, and diastolic heart failure, A. fib, ovarian cancer, bladder cancer, and obstructive sleep apnea on C Pap. She presents to the ER with about obstruction. Her  daughter who is a former O. R. nurse at [employer], reports that she began vomiting yesterday afternoon. The vomiting has been persistent she is unable to keep down even liquids. She has not had a bowel movement in several days. Her daughter states that the patient absolutely does not want surgery her previous bowel obstructions have been managed conservatively. She also states that her mother had a bad GI bleed from her previous nasogastric tube, so if possible they would like to avoid an NG tube, she needs it. She is normally on Eliquis and rate control medications for A. fib. Unfortunately in the ER she appears to be in A. fib with RVR. Per her daughter the patient is a DO NOT RESUSCITATE.  CAT scan of the abdomen and pelvis shows small bowel obstruction, she is dehydrated and has mild acute renal failure with an elevation in her creatinine from 0.8 to 1.32. Lactic acid is 2.54.    Consultants:  none  Procedures: none  Antibiotics: none  HPI/Subjective: Feculent material in cainseter , denies pain  Objective: Filed Vitals:   08/25/14 1959 08/26/14 0327 08/26/14 0934 08/26/14 1200  BP: 131/66 126/91 106/88   Pulse: 79 95 55 117  Temp: 99.5 F (37.5 C) 98.6 F (37 C) 98 F (36.7 C)   TempSrc:   Oral   Resp: 18 17 18    Height:      Weight: 90.266 kg (199 lb)     SpO2: 95% 93% 94%     Intake/Output Summary (Last 24 hours) at 08/26/14 1521 Last data filed at 08/26/14  1312  Gross per 24 hour  Intake  962.6 ml  Output   2250 ml  Net -1287.4 ml    Exam:  General: No acute respiratory distress Lungs: Clear to auscultation bilaterally without wheezes or crackles Cardiovascular: Regular rate and rhythm without murmur gallop or rub normal S1 and S2 Abdomen: Nontender, nondistended, soft, bowel sounds positive, no rebound, no ascites, no appreciable mass Extremities: No significant cyanosis, clubbing, or edema bilateral lower extremities      Data Reviewed: Basic Metabolic  Panel:  Recent Labs Lab 08/25/14 1023 08/26/14 0530  NA 140 141  K 4.5 4.0  CL 102 108  CO2 25 23  GLUCOSE 138* 112*  BUN 30* 45*  CREATININE 1.32* 1.26*  CALCIUM 9.9 8.9    Liver Function Tests:  Recent Labs Lab 08/25/14 1023 08/26/14 1214  AST 32 25  ALT 20 15  ALKPHOS 78 61  BILITOT 1.2 1.1  PROT 8.0 6.9  ALBUMIN 4.6 3.7    Recent Labs Lab 08/25/14 1023  LIPASE 34   No results for input(s): AMMONIA in the last 168 hours.  CBC:  Recent Labs Lab 08/25/14 1023  WBC 8.3  NEUTROABS 7.2  HGB 16.9*  HCT 50.7*  MCV 90.4  PLT 193    Cardiac Enzymes:  Recent Labs Lab 08/25/14 0002 08/25/14 1728 08/26/14 0530  TROPONINI 0.03 <0.03 <0.03   BNP (last 3 results) No results for input(s): BNP in the last 8760 hours.  ProBNP (last 3 results) No results for input(s): PROBNP in the last 8760 hours.    CBG:  Recent Labs Lab 08/25/14 1953 08/26/14 0002 08/26/14 0403 08/26/14 0900 08/26/14 1138  GLUCAP 110* 115* 108* 107* 129*    No results found for this or any previous visit (from the past 240 hour(s)).   Studies: Ct Abdomen Pelvis Wo Contrast  08/25/2014   CLINICAL DATA:  Sharp abdominal pain and vomiting  EXAM: CT ABDOMEN AND PELVIS WITHOUT CONTRAST  TECHNIQUE: Multidetector CT imaging of the abdomen and pelvis was performed following the standard protocol without IV contrast.  COMPARISON:  11/11/2012  FINDINGS: The lung bases are free of acute infiltrate or sizable effusion. A small pericardial effusion is noted. The cardiac shadow is enlarged.  The liver, gallbladder, spleen, adrenal glands and pancreas are within normal limits. The kidneys are within normal limits without evidence of calculi or obstructive change. 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  Significant distension of the stomach and proximal small bowel is identified. This continues distally to  the right lower quadrant and on image number 46 of series 2 there is an abrupt transition identified with more normal small bowel noted beyond this region. This area lies in an area of previous scar in the anterior abdominal wall and is likely related to adhesions. The more distal small bowel as well as the colon appear within normal limits.  The bladder is partially distended. The uterus and appendix have been surgically removed. Minimal free fluid is noted within the pelvis. Diffuse aortoiliac calcifications are seen without aneurysmal dilatation. Degenerative changes of the lumbar spine are seen. No acute compression deformity is noted.  IMPRESSION: Significant distention of the stomach and small bowel to an area in the right lower quadrant. The overlying skin is attenuated likely related to prior postsurgical scar. This obstructive changes likely related to postoperative adhesions.  Although not mentioned in the  body of the report the majority of the small bowel lies within the right abdomen likely related to partial malrotation. These changes are stable from a prior study.  No other acute abnormality is seen.   Electronically Signed   By: Inez Catalina M.D.   On: 08/25/2014 14:03   Dg Abd 1 View  08/26/2014   CLINICAL DATA:  Subsequent evaluation of small bowel obstruction with nausea  EXAM: ABDOMEN - 1 VIEW  COMPARISON:  01/03/2014  FINDINGS: NG tube projects over the stomach. There is gas throughout small and large bowel. There are no particularly dilated loops of bowel. Gas is seen into the rectum. There are a few clinically dilated loops of small bowel in the mid abdomen measuring up to about 3.7 cm.  IMPRESSION: Further improvement in small bowel obstruction.   Electronically Signed   By: Skipper Cliche M.D.   On: 08/26/2014 14:37    Scheduled Meds: . aspirin  300 mg Rectal Daily  . insulin aspart  0-9 Units Subcutaneous 6 times per day  . levothyroxine  56 mcg Intravenous QAC breakfast  .  metoprolol  5 mg Intravenous 4 times per day  . sodium chloride  3 mL Intravenous Q12H   Continuous Infusions: . sodium chloride 125 mL/hr at 08/26/14 1312  . heparin 1,200 Units/hr (08/26/14 1154)    Principal Problem:   Bowel obstruction Active Problems:   SBO (small bowel obstruction)   Nausea & vomiting   Hypothyroidism   Hyperlipidemia   OSA (obstructive sleep apnea)   HX: breast cancer   H/O ovarian cancer   Hx of bladder cancer   Essential hypertension   Small bowel obstruction   Atrial fibrillation with RVR   Acute renal failure   Diastolic heart failure    Time spent: 40 minutes   [facility] Pager [phone]. If 7PM-7AM, please contact night-coverage at [email or web address], password [facility] 08/26/2014, 3:21 PM  LOS: 1 day

## 2014-08-26 NOTE — Progress Notes (Signed)
Patient's NGT with no output since canister emptied at 11:30am. NGT flushed with sterile water with night shift CN. Placement checked. About 65mls brown liquid came out after flushing. Night shift RN aware.   Joellen Jersey, RN.

## 2014-08-26 NOTE — Evaluation (Signed)
Occupational Therapy Evaluation Patient Details Name: [name] MRN: [id number] DOB: 1920-04-03 Today's Date: 08/26/2014    History of Present Illness Pt is a 81 y.o. female, with a PMH of 16 bowel obstructions in the past 5 years, diastolic heart failure, A. fib, ovarian cancer, bladder cancer, and obstructive sleep apnea on C Pap. She presents to the ER with concern for bowel obstruction. Her daughter who is a former O.R. Marine scientist at [employer], reports that she began vomiting yesterday afternoon. The vomiting has been persistent she is unable to keep down even liquids. She has not had a bowel movement in several days. Her daughter states that the patient absolutely does not want surgery her previous bowel obstructions have been managed conservatively. Unfortunately in the ER she appears to be in A. fib with RVR. Per her daughter the patient is a DO NOT RESUSCITATE. CAT scan of the abdomen and pelvis shows small bowel obstruction, she is dehydrated and has mild acute renal failure.   Clinical Impression   This 81 yo female admitted with above presents to acute OT with decreased balance, decreased mobility, obesity, NGtube all affecting her ability to care for herself at an Independent to Mod I level as she supposedly was pta. She will benefit from acute OT with follow up at SNF.    Follow Up Recommendations  SNF    Equipment Recommendations   (TBD at next venue)       Precautions / Restrictions Precautions Precautions: Fall Precaution Comments: NG tube on suction in place. Restrictions Weight Bearing Restrictions: No      Mobility Bed Mobility Overal bed mobility: Needs Assistance Bed Mobility: Supine to Sit     Supine to sit: Min assist     General bed mobility comments: Pt up in recliner upon arrival  Transfers Overall transfer level: Needs assistance Equipment used: Rolling walker (2 wheeled) Transfers: Sit to/from Stand Sit to Stand: Min assist         General  transfer comment: Pt's natural way at home is to pull up on RW    Balance Overall balance assessment: Needs assistance Sitting-balance support: Feet supported;No upper extremity supported Sitting balance-Leahy Scale: Fair     Standing balance support: Bilateral upper extremity supported Standing balance-Leahy Scale: Poor                              ADL Overall ADL's : Needs assistance/impaired Eating/Feeding: NPO   Grooming: Set up;Sitting   Upper Body Bathing: Set up;Sitting   Lower Body Bathing: Moderate assistance (with min A sit<>stand)   Upper Body Dressing : Set up;Sitting   Lower Body Dressing: Moderate assistance (with min A sit<>stand)   Toilet Transfer: Minimal assistance;Ambulation;RW (recliner>4 steps forward and back>sit in recliner)   Toileting- Clothing Manipulation and Hygiene: Moderate assistance (with min A sit<>stand)                         Pertinent Vitals/Pain Pain Assessment: No/denies pain     Hand Dominance Right   Extremity/Trunk Assessment Upper Extremity Assessment Upper Extremity Assessment: Overall WFL for tasks assessed     Communication Communication Communication: No difficulties   Cognition Arousal/Alertness: Awake/alert Behavior During Therapy: WFL for tasks assessed/performed Overall Cognitive Status: Within Functional Limits for tasks assessed  Home Living Family/patient expects to be discharged to:: Private residence Living Arrangements: Children Available Help at Discharge: Family;Available PRN/intermittently (daughter that she lives with is gone 10 hours) Type of Home: House Home Access: Level entry     Home Layout: One level     Bathroom Shower/Tub: Walk-in Hydrologist: Handicapped height     Home Equipment: Radiation protection practitioner Equipment: Reacher;Sock aid;Long-handled sponge Additional Comments: pt has handicap  bathroom; has a daughter that is a retired Marine scientist and a daughter who lives with her who is a Pharmacist, hospital. Pt reports that there will be times that she is alone at home.       Prior Functioning/Environment Level of Independence: Independent with assistive device(s)        Comments: ambulates with RW    OT Diagnosis: Generalized weakness   OT Problem List: Decreased strength;Decreased range of motion;Impaired balance (sitting and/or standing);Obesity   OT Treatment/Interventions: Self-care/ADL training;DME and/or AE instruction;Balance training;Patient/family education;Therapeutic activities    OT Goals(Current goals can be found in the care plan section) Acute Rehab OT Goals Patient Stated Goal: to have a bowel movement OT Goal Formulation: With patient/family Time For Goal Achievement: 09/09/14 Potential to Achieve Goals: Good  OT Frequency: Min 2X/week   Barriers to D/C: Decreased caregiver support             End of Session Equipment Utilized During Treatment: Rolling walker Nurse Communication:  (Pt's NG canister is getting really full)  Activity Tolerance: Patient tolerated treatment well Patient left: in chair;with call bell/phone within reach;with chair alarm set;with family/visitor present   Time: 1100-1131 OT Time Calculation (min): 31 min Charges:  OT General Charges $OT Visit: 1 Procedure OT Evaluation $Initial OT Evaluation Tier I: 1 Procedure OT Treatments $Self Care/Home Management : 8-22 mins  Almon Register [phone] 08/26/2014, 11:50 AM

## 2014-08-26 NOTE — Progress Notes (Addendum)
ANTICOAGULATION CONSULT NOTE - Initial Consult  Pharmacy Consult for Heparin while NPO Indication: atrial fibrillation  Allergies  Allergen Reactions  . Codeine     unknown  . Morphine And Related     sick  . Statins     sick  . Zetia [Ezetimibe] Other (See Comments)    Side effect to strong     Patient Measurements: Height: 5\' 1"  (154.9 cm) Weight: 199 lb (90.266 kg) IBW/kg (Calculated) : 47.8 Heparin Dosing Weight: 71 kg  Vital Signs: Temp: 98.6 F (37 C) (02/25 0327) Temp Source: Axillary (02/24 1654) BP: 126/91 mmHg (02/25 0327) Pulse Rate: 95 (02/25 0327)  Labs:  Recent Labs  08/25/14 0002 08/25/14 1023 08/25/14 1600 08/25/14 1728 08/26/14 0141  HGB  --  16.9*  --   --   --   HCT  --  50.7*  --   --   --   PLT  --  193  --   --   --   APTT  --   --  32  --  89*  HEPARINUNFRC  --   --   --  >2.20*  --   CREATININE  --  1.32*  --   --   --   TROPONINI 0.03  --   --  <0.03  --     Estimated Creatinine Clearance: 27.8 mL/min (by C-G formula based on Cr of 1.32).   Medical History: Past Medical History  Diagnosis Date  . Hypertension   . SBO (small bowel obstruction)   . Permanent atrial fibrillation     a. coumadin d/c'd => Pradaxa in 05/2012  . Chronic diastolic heart failure     a. Echo 5/12: Mild LVH, EF 55-60%, mild AI, mild MR, severe LAE, mild RAE, PASP 31, small pericardial effusion  . Venous insufficiency     chronic LE edema  . HLD (hyperlipidemia)   . OSA (obstructive sleep apnea)   . Hypothyroidism   . Diastolic CHF, chronic 1/61/0960    Class 2b-3 2 d echo 5/12 mild LVH EF 55-50%, MILD AI, MILD MR, SEVERE LAE, mild RAE, PASP 31, SMALL PERICRADIAL EFFUSION  . Hx of cervical cancer 11/11/2012  . S/P hysterectomy 11/11/2012  . Colon cancer     "polyp" (12/08/2012)  . Breast cancer   . Cervical cancer   . Ovarian cancer   . Bladder cancer   . H/O ovarian cancer 11/11/2012     MUCINOUS CYST S/P RESECTION 35 YEARS AGO  . Hx of bladder  cancer 11/11/2012  . SBO (small bowel obstruction) 08/25/2014  . DM2 (diabetes mellitus, type 2)     TYPE 2    Medications:  Prescriptions prior to admission  Medication Sig Dispense Refill Last Dose  . apixaban (ELIQUIS) 5 MG TABS tablet Take 1 tablet (5 mg total) by mouth 2 (two) times daily. 180 tablet 3 08/24/2014 at Unknown time  . bisacodyl (DULCOLAX) 5 MG EC tablet Take 5 mg by mouth daily.    08/24/2014 at Unknown time  . Cyanocobalamin (VITAMIN B-12 IJ) Inject 1 mL as directed every 30 (thirty) days.    Taking  . diltiazem (TIAZAC) 360 MG 24 hr capsule Take 360 mg by mouth daily.   08/24/2014 at Unknown time  . ferrous sulfate 325 (65 FE) MG tablet Take 325 mg by mouth daily with breakfast.   08/24/2014 at Unknown time  . furosemide (LASIX) 40 MG tablet Take 1 tablet (40 mg total) by mouth daily.  90 tablet 3 08/24/2014 at Unknown time  . levothyroxine (SYNTHROID, LEVOTHROID) 112 MCG tablet Take 224 mcg by mouth daily before breakfast.   08/24/2014 at Unknown time  . potassium chloride SA (K-DUR,KLOR-CON) 20 MEQ tablet Take 20 mEq by mouth daily.   08/24/2014 at Unknown time  . PRESCRIPTION MEDICATION Apply 1 application topically daily as needed. Apply cream under breasts for rash daily.     . ramipril (ALTACE) 10 MG capsule Take 1 capsule (10 mg total) by mouth daily. 90 capsule 3 08/24/2014 at Unknown time   Scheduled:  . sodium chloride   Intravenous STAT  . aspirin  300 mg Rectal Daily  . insulin aspart  0-9 Units Subcutaneous 6 times per day  . levothyroxine  56 mcg Intravenous QAC breakfast  . metoprolol  5 mg Intravenous 4 times per day  . sodium chloride  3 mL Intravenous Q12H   Infusions:  . sodium chloride 75 mL/hr at 08/26/14 0015  . heparin 1,000 Units/hr (08/25/14 1803)    Assessment: 81yo female with history of Afib on apixaban presents with emesis and abdominal pain. Pharmacy consulted to dose heparin for Afib while patient is NPO.   APTT = 89 seconds on IV heparin  drip rate 1000 units/hr.  Heparin level  >2 due to apixaban last dose taken 2/23. aPTT is therapeutic.  No bleeding reported. Will need to use aPTT until apixaban no longer impacts Heparin level.  Goal of Therapy:  Heparin level 0.3-0.7 units/ml aPTT 66-102 seconds Monitor platelets by anticoagulation protocol: Yes   Plan:  Continue IV heparin rate 1000 units/hr APTT and heparin level in 8hr to confirm remains therapeutic. APTT and HL daily. Monitor s/sx of bleeding  Nicole Cella, RPh Clinical Pharmacist Pager: [phone] 08/26/2014,3:59 AM

## 2014-08-26 NOTE — Progress Notes (Signed)
ANTICOAGULATION CONSULT NOTE - Follow Up Consult  Pharmacy Consult for heparin Indication: atrial fibrillation  Allergies  Allergen Reactions  . Codeine     unknown  . Morphine And Related     sick  . Statins     sick  . Zetia [Ezetimibe] Other (See Comments)    Side effect to strong     Patient Measurements: Height: 5\' 1"  (154.9 cm) Weight: 199 lb (90.266 kg) IBW/kg (Calculated) : 47.8 Heparin Dosing Weight: 71 kg  Vital Signs: Temp: 97.7 F (36.5 C) (02/25 1942) Temp Source: Oral (02/25 1747) BP: 136/71 mmHg (02/25 1942) Pulse Rate: 90 (02/25 1942)  Labs:  Recent Labs  08/25/14 0002 08/25/14 1023  08/25/14 1728 08/26/14 0141 08/26/14 0530 08/26/14 0935 08/26/14 2010  HGB  --  16.9*  --   --   --   --   --  14.7  HCT  --  50.7*  --   --   --   --   --  44.8  PLT  --  193  --   --   --   --   --  149*  APTT  --   --   < >  --  89*  --  45* 115*  HEPARINUNFRC  --   --   < > >2.20* >2.00*  --  >2.20* >2.00*  CREATININE  --  1.32*  --   --   --  1.26*  --   --   TROPONINI 0.03  --   --  <0.03  --  <0.03  --   --   < > = values in this interval not displayed.  Estimated Creatinine Clearance: 29.1 mL/min (by C-G formula based on Cr of 1.26).  Assessment: Patient is a 81 yo female with history of Afib on apixaban presents with emesis and abdominal pain. Pharmacy is consulted to dose heparin for Afib while patient is NPO.  Heparin level is >2.0 but this is due to residual effect of Eliquis.  APTT is now above goal after heparin increased to 1200 units/hr  Goal of Therapy:  Heparin level 0.3-0.7 units/ml aPTT 66-102 seconds Monitor platelets by anticoagulation protocol: Yes   Plan:  -Decrease heparin to 1100 units/hr -Recheck aptt in am  Hildred Laser, Pharm D 08/26/2014 10:14 PM

## 2014-08-26 NOTE — Progress Notes (Signed)
ANTICOAGULATION CONSULT NOTE - Follow Up Consult  Pharmacy Consult for heparin Indication: atrial fibrillation  Allergies  Allergen Reactions  . Codeine     unknown  . Morphine And Related     sick  . Statins     sick  . Zetia [Ezetimibe] Other (See Comments)    Side effect to strong     Patient Measurements: Height: 5\' 1"  (154.9 cm) Weight: 199 lb (90.266 kg) IBW/kg (Calculated) : 47.8 Heparin Dosing Weight: 71 kg  Vital Signs: Temp: 98 F (36.7 C) (02/25 0934) Temp Source: Oral (02/25 0934) BP: 106/88 mmHg (02/25 0934) Pulse Rate: 55 (02/25 0934)  Labs:  Recent Labs  08/25/14 0002 08/25/14 1023 08/25/14 1600 08/25/14 1728 08/26/14 0141 08/26/14 0530 08/26/14 0935  HGB  --  16.9*  --   --   --   --   --   HCT  --  50.7*  --   --   --   --   --   PLT  --  193  --   --   --   --   --   APTT  --   --  32  --  89*  --  45*  HEPARINUNFRC  --   --   --  >2.20* >2.00*  --  >2.20*  CREATININE  --  1.32*  --   --   --  1.26*  --   TROPONINI 0.03  --   --  <0.03  --  <0.03  --     Estimated Creatinine Clearance: 29.1 mL/min (by C-G formula based on Cr of 1.26).  Assessment: Patient is a 81 yo female with history of Afib on apixaban presents with emesis and abdominal pain. Pharmacy is consulted to dose heparin for Afib while patient is NPO.  Heparin level is >2.20 but this is due to residual effect of Eliquis.  APTT is subtherapeutic at 0.45 - will adjust heparin rate based on aPTT levels for now.  Per RN, no bleeding noted.  Goal of Therapy:  Heparin level 0.3-0.7 units/ml aPTT 66-102 seconds Monitor platelets by anticoagulation protocol: Yes   Plan:  - increase heparin drip to 1200 units/hr - recheck another 8 hour heparin level and aPTT  [name] P 08/26/2014,11:26 AM

## 2014-08-26 NOTE — Clinical Social Work Note (Signed)
CSW received SNF consult for patient. Visited room and patient was asleep. CSW spoke with daughter Casandra Doffing by phone and she is in agreement with short-term rehab. CSW will talk further with [name] and patient on Friday. Full assessment to follow.   Kristen Fromm Givens, MSW, LCSW Licensed Clinical Social Worker [place] [phone]

## 2014-08-26 NOTE — Evaluation (Signed)
Physical Therapy Evaluation Patient Details Name: [name] MRN: [id number] DOB: 08/08/1919 Today's Date: 08/26/2014   History of Present Illness  Pt is a 81 y.o. female, with a PMH of 16 bowel obstructions in the past 5 years, diastolic heart failure, A. fib, ovarian cancer, bladder cancer, and obstructive sleep apnea on C Pap. She presents to the ER with concern for bowel obstruction. Her daughter who is a former O.R. Marine scientist at [employer], reports that she began vomiting yesterday afternoon. The vomiting has been persistent she is unable to keep down even liquids. She has not had a bowel movement in several days. Her daughter states that the patient absolutely does not want surgery her previous bowel obstructions have been managed conservatively. Unfortunately in the ER she appears to be in A. fib with RVR. Per her daughter the patient is a DO NOT RESUSCITATE. CAT scan of the abdomen and pelvis shows small bowel obstruction, she is dehydrated and has mild acute renal failure.  Clinical Impression  Pt admitted with above diagnosis. Pt currently with functional limitations due to the deficits listed below (see PT Problem List). At the time of PT eval pt was able to perform transfers with a varying range of required assist (min assist to max assist +2 for safety). Mobility was limited by NG tube suction. Pt will benefit from skilled PT to increase their independence and safety with mobility to allow discharge to the venue listed below.        Follow Up Recommendations SNF;Supervision/Assistance - 24 hour    Equipment Recommendations  None recommended by PT    Recommendations for Other Services       Precautions / Restrictions Precautions Precautions: Fall Precaution Comments: NG tube on suction in place. Restrictions Weight Bearing Restrictions: No      Mobility  Bed Mobility Overal bed mobility: Needs Assistance Bed Mobility: Supine to Sit     Supine to sit: Min assist     General  bed mobility comments: Assit to elevate trunk into full sitting position.   Transfers Overall transfer level: Needs assistance Equipment used: Rolling walker (2 wheeled) Transfers: Sit to/from Stand Sit to Stand: Min assist;+2 safety/equipment;+2 physical assistance         General transfer comment: Min assist to power-up to full standing and maintain balance as she took pivotal steps around to the chair. Assist to take backwards steps back to the recliner chair. Pt lost balance posteriorly twice requiring max assist to recover.  Ambulation/Gait             General Gait Details: Gait training limited due to suction at this time.   Stairs            Wheelchair Mobility    Modified Rankin (Stroke Patients Only)       Balance Overall balance assessment: Needs assistance Sitting-balance support: Feet supported;No upper extremity supported Sitting balance-Leahy Scale: Fair     Standing balance support: Bilateral upper extremity supported;During functional activity Standing balance-Leahy Scale: Poor                               Pertinent Vitals/Pain Pain Assessment: No/denies pain    Home Living Family/patient expects to be discharged to:: Private residence Living Arrangements: Children Available Help at Discharge: Family;Available PRN/intermittently Type of Home: House Home Access: Level entry     Home Layout: One level Home Equipment: Walker - 2 wheels;Shower seat;Bedside commode;Grab bars -  tub/shower;Grab bars - toilet Additional Comments: pt has handicap bathroom; has a daughter that is a retired Marine scientist and a daughter who lives with her who is a Pharmacist, hospital. Pt reports that there will be times that she is alone at home.     Prior Function Level of Independence: Independent with assistive device(s)         Comments: ambulates with RW     Hand Dominance   Dominant Hand: Right    Extremity/Trunk Assessment   Upper Extremity Assessment:  Defer to OT evaluation           Lower Extremity Assessment: Generalized weakness      Cervical / Trunk Assessment: Kyphotic  Communication   Communication: No difficulties  Cognition Arousal/Alertness: Awake/alert Behavior During Therapy: WFL for tasks assessed/performed Overall Cognitive Status: Within Functional Limits for tasks assessed                      General Comments      Exercises        Assessment/Plan    PT Assessment Patient needs continued PT services  PT Diagnosis Difficulty walking;Generalized weakness   PT Problem List Decreased strength;Decreased activity tolerance;Decreased range of motion;Decreased balance;Decreased mobility;Decreased knowledge of use of DME;Decreased safety awareness;Decreased knowledge of precautions  PT Treatment Interventions DME instruction;Gait training;Stair training;Functional mobility training;Therapeutic activities;Therapeutic exercise;Neuromuscular re-education;Patient/family education   PT Goals (Current goals can be found in the Care Plan section) Acute Rehab PT Goals Patient Stated Goal: Get a suppository  PT Goal Formulation: With patient Time For Goal Achievement: 09/09/14 Potential to Achieve Goals: Good    Frequency Min 2X/week   Barriers to discharge        Co-evaluation               End of Session Equipment Utilized During Treatment: Gait belt Activity Tolerance: Patient tolerated treatment well Patient left: in chair;with chair alarm set;with call bell/phone within reach Nurse Communication: Mobility status         Time: 1017-1050 PT Time Calculation (min) (ACUTE ONLY): 33 min   Charges:   PT Evaluation $Initial PT Evaluation Tier I: 1 Procedure PT Treatments $Gait Training: 8-22 mins   PT G Codes:        Rolinda Roan August 29, 2014, 11:41 AM   Rolinda Roan, PT, DPT Acute Rehabilitation Services Pager: [phone]

## 2014-08-27 ENCOUNTER — Inpatient Hospital Stay (HOSPITAL_COMMUNITY): Payer: Medicare Other

## 2014-08-27 LAB — COMPREHENSIVE METABOLIC PANEL
ALBUMIN: 3.4 g/dL — AB (ref 3.5–5.2)
ALT: 16 U/L (ref 0–35)
ANION GAP: 7 (ref 5–15)
AST: 21 U/L (ref 0–37)
Alkaline Phosphatase: 55 U/L (ref 39–117)
BILIRUBIN TOTAL: 1 mg/dL (ref 0.3–1.2)
BUN: 36 mg/dL — AB (ref 6–23)
CALCIUM: 8 mg/dL — AB (ref 8.4–10.5)
CHLORIDE: 114 mmol/L — AB (ref 96–112)
CO2: 23 mmol/L (ref 19–32)
CREATININE: 1.03 mg/dL (ref 0.50–1.10)
GFR calc Af Amer: 53 mL/min — ABNORMAL LOW (ref >90)
GFR calc non Af Amer: 46 mL/min — ABNORMAL LOW (ref >90)
Glucose, Bld: 112 mg/dL — ABNORMAL HIGH (ref 70–99)
Potassium: 3.6 mmol/L (ref 3.5–5.1)
Sodium: 144 mmol/L (ref 135–145)
TOTAL PROTEIN: 5.7 g/dL — AB (ref 6.0–8.3)

## 2014-08-27 LAB — CBC
HCT: 44 % (ref 36.0–46.0)
Hemoglobin: 14 g/dL (ref 12.0–15.0)
MCH: 29.3 pg (ref 26.0–34.0)
MCHC: 31.8 g/dL (ref 30.0–36.0)
MCV: 92.1 fL (ref 78.0–100.0)
PLATELETS: 155 10*3/uL (ref 150–400)
RBC: 4.78 MIL/uL (ref 3.87–5.11)
RDW: 15 % (ref 11.5–15.5)
WBC: 7.7 10*3/uL (ref 4.0–10.5)

## 2014-08-27 LAB — APTT: aPTT: 70 seconds — ABNORMAL HIGH (ref 24–37)

## 2014-08-27 LAB — HEMOGLOBIN A1C
Hgb A1c MFr Bld: 5.5 % (ref 4.8–5.6)
Mean Plasma Glucose: 111 mg/dL

## 2014-08-27 LAB — GLUCOSE, CAPILLARY
GLUCOSE-CAPILLARY: 104 mg/dL — AB (ref 70–99)
GLUCOSE-CAPILLARY: 86 mg/dL (ref 70–99)
GLUCOSE-CAPILLARY: 96 mg/dL (ref 70–99)
Glucose-Capillary: 109 mg/dL — ABNORMAL HIGH (ref 70–99)
Glucose-Capillary: 93 mg/dL (ref 70–99)

## 2014-08-27 LAB — HEPARIN LEVEL (UNFRACTIONATED): Heparin Unfractionated: 1.74 IU/mL — ABNORMAL HIGH (ref 0.30–0.70)

## 2014-08-27 MED ORDER — DILTIAZEM HCL ER BEADS 240 MG PO CP24: 360.0000 mg | ORAL_CAPSULE | Freq: Every day | ORAL | Status: DC

## 2014-08-27 MED ORDER — DILTIAZEM HCL ER COATED BEADS 360 MG PO CP24
360.0000 mg | ORAL_CAPSULE | Freq: Every day | ORAL | Status: DC
  Administered 2014-08-27 – 2014-08-31 (×5): 360 mg via ORAL
  Filled 2014-08-27 (×6): qty 1

## 2014-08-27 MED ORDER — METOPROLOL TARTRATE 1 MG/ML IV SOLN
5.0000 mg | INTRAVENOUS | Status: DC | PRN
  Administered 2014-08-27: 5 mg via INTRAVENOUS
  Filled 2014-08-27 (×2): qty 5

## 2014-08-27 MED ORDER — BISACODYL 10 MG RE SUPP: 10.0000 mg | Freq: Every day | RECTAL | Status: DC | PRN

## 2014-08-27 MED ORDER — WHITE PETROLATUM GEL
Status: AC
  Filled 2014-08-27: qty 1

## 2014-08-27 MED ORDER — BISACODYL 5 MG PO TBEC: 10.0000 mg | DELAYED_RELEASE_TABLET | Freq: Every day | ORAL | Status: DC | PRN

## 2014-08-27 MED ORDER — BISACODYL 10 MG RE SUPP
10.0000 mg | Freq: Every day | RECTAL | Status: DC | PRN
  Administered 2014-08-30: 10 mg via RECTAL
  Filled 2014-08-27: qty 1

## 2014-08-27 MED ORDER — SODIUM CHLORIDE 0.9 % IV SOLN
INTRAVENOUS | Status: DC
  Administered 2014-08-27 – 2014-08-28 (×3): via INTRAVENOUS

## 2014-08-27 MED ORDER — SENNOSIDES-DOCUSATE SODIUM 8.6-50 MG PO TABS
1.0000 | ORAL_TABLET | Freq: Two times a day (BID) | ORAL | Status: DC
  Administered 2014-08-27 – 2014-08-31 (×7): 1 via ORAL
  Filled 2014-08-27 (×10): qty 1

## 2014-08-27 NOTE — Clinical Social Work Psychosocial (Signed)
Clinical Social Work Department BRIEF PSYCHOSOCIAL ASSESSMENT 08/27/2014  Patient:  [name], [name]     Account Number:  [id number]     Admit date:  08/25/2014  Clinical Social Worker:  Frederico Hamman  Date/Time:  08/27/2014 12:18 PM  Referred by:  Physician  Date Referred:  08/26/2014 Referred for  SNF Placement   Other Referral:   Interview type:  Family Other interview type:    PSYCHOSOCIAL DATA Living Status:  FAMILY Admitted from facility:   Level of care:   Primary support name:  [name] Primary support relationship to patient:  CHILD, ADULT Degree of support available:   Both of patient's daughter's are involved. Patient lives with her daughter [name] and daughter [name] very concerned about her personal and medical well-being.    CURRENT CONCERNS Current Concerns  Post-Acute Placement   Other Concerns:    SOCIAL WORK ASSESSMENT / PLAN On 08/26/14 CSW talked by phone with patient's daughter [name] [phone]) regarding discharge planning and recommendation of short-term rehab. [name] was happy to speak with CSW and is in total agreement with [facility] rehab for her mother. [name] reported to [place] that her mother sits at home alone for hours while her sister [name] is at work, so she would have no one to look after her during the day.  Daughter informed CSW that patient has been to [facility] before and may want that facility again. CSW also visited room to speak with patient but she was asleep.    On 08/27/14, CSW met with patient and daughter [name] at the bedside to further discuss SNF placement. Patient awake and alert and very involved in the conversation and asked appropriate questions. [name] preference is [name] and daughter is fine with patient's choice. [name] stressed to patient during the conversation the importance of her going to a facility for rehab because of her being  alone during the day and the danger of tripping over her dog or falling because she's weak and patient nodded agreement. CSW also informed by [name] that she and her sister don't talk, and [name] informed that [name] will be contacted and updated and patient was fine with this plan. Call later made to [name] to update her on discharge plans and message left.   Assessment/plan status:  Psychosocial Support/Ongoing Assessment of Needs Other assessment/ plan:   Information/referral to community resources:   Daughter/patient provided with SNF list for [facility].    PATIENT'S/FAMILY'S RESPONSE TO PLAN OF CARE: Patient and daughter [name] in agreement with short-term rehab and were very engaged in the conversation with CSW.     Raizy Auzenne Givens, MSW, LCSW Licensed Clinical Social Worker [place] [phone]

## 2014-08-27 NOTE — Progress Notes (Signed)
[facility] PROGRESS NOTE  [name] ASN:[id number] DOB: 1920-01-26 DOA: 08/25/2014 PCP: [name], MD  Assessment/Plan: Principal Problem:   Bowel obstruction Active Problems:   SBO (small bowel obstruction)   Nausea & vomiting   Hypothyroidism   Hyperlipidemia   OSA (obstructive sleep apnea)   HX: breast cancer   H/O ovarian cancer   Hx of bladder cancer   Essential hypertension   Small bowel obstruction   Atrial fibrillation with RVR   Acute renal failure   Diastolic heart failure    Small bowel obstruction -improving due to adhesion and known anatomical abnormality. Conservative management with bowel rest. Cont  NG and monitor output over next 24 hrs  KUB improving Maintain potassium between 4.5-5. Started the patient on clear liquids Continue stool softeners and suppositories  Diastolic heart failure Patient is currently dehydrated but does have some minimal swelling in her lower extremities. Cont  IV fluids while she is nothing by mouth. And monitor volume status closely. Last echo 6/15, LVEF 50-55%.  Acute renal failure-improving Secondary to dehydration and vomiting. We'll decrease the rate of IV fluids to 75 mL an hour as the patient has been started on clear liquids  A. fib with RVR Cont iv metoprolol rn  We'll continue with IV metoprolol Q6 hours. Heparin per pharmacy. Holding  Eliquis until the patient is able to establish by mouth intake without any nausea or vomiting  Hyperglycemia Sliding scale insulin sensitive with every 4 hours CBGs. pending hemoglobin A1c.  Chest pain Likely GI in origin .  Negative  Troponins.start Protonix   Hypothyroidism IV Synthroid.  Obstructive sleep apnea C Pap daily at bedtime.  Code Status:DNR  Family Communication: chris [phone] updated  Disposition Plan: Continue to slowly advance diet    Brief narrative: [name] is a 81 y.o. female, with a past medical history of 16 bowel  obstructions in the past 5 years, and diastolic heart failure, A. fib, ovarian cancer, bladder cancer, and obstructive sleep apnea on C Pap. She presents to the ER with about obstruction. Her daughter who is a former O. R. nurse at [employer], reports that she began vomiting yesterday afternoon. The vomiting has been persistent she is unable to keep down even liquids. She has not had a bowel movement in several days. Her daughter states that the patient absolutely does not want surgery her previous bowel obstructions have been managed conservatively. She also states that her mother had a bad GI bleed from her previous nasogastric tube, so if possible they would like to avoid an NG tube, she needs it. She is normally on Eliquis and rate control medications for A. fib. Unfortunately in the ER she appears to be in A. fib with RVR. Per her daughter the patient is a DO NOT RESUSCITATE.  CAT scan of the abdomen and pelvis shows small bowel obstruction, she is dehydrated and has mild acute renal failure with an elevation in her creatinine from 0.8 to 1.32. Lactic acid is 2.54.    Consultants:  none  Procedures: none  Antibiotics: none  HPI/Subjective: Feculent material in cainseter , denies pain  Objective: Filed Vitals:   08/27/14 0441 08/27/14 0838 08/27/14 0856 08/27/14 1158  BP: 122/78 158/120 154/119 154/81  Pulse:  94 128 101  Temp:  98.5 F (36.9 C) 98.5 F (36.9 C)   TempSrc:  Oral Oral   Resp:  18 18 18   Height:      Weight:      SpO2:  94% 94% 93%    Intake/Output Summary (Last 24 hours) at 08/27/14 1338 Last data filed at 08/27/14 0851  Gross per 24 hour  Intake      0 ml  Output   1565 ml  Net  -1565 ml    Exam:  General: No acute respiratory distress Lungs: Clear to auscultation bilaterally without wheezes or crackles Cardiovascular: Regular rate and rhythm without murmur gallop or rub normal S1 and S2 Abdomen: Nontender, nondistended, soft, bowel sounds positive, no  rebound, no ascites, no appreciable mass Extremities: No significant cyanosis, clubbing, or edema bilateral lower extremities      Data Reviewed: Basic Metabolic Panel:  Recent Labs Lab 08/25/14 1023 08/26/14 0530 08/27/14 0523  NA 140 141 144  K 4.5 4.0 3.6  CL 102 108 114*  CO2 25 23 23   GLUCOSE 138* 112* 112*  BUN 30* 45* 36*  CREATININE 1.32* 1.26* 1.03  CALCIUM 9.9 8.9 8.0*    Liver Function Tests:  Recent Labs Lab 08/25/14 1023 08/26/14 1214 08/27/14 0523  AST 32 25 21  ALT 20 15 16   ALKPHOS 78 61 55  BILITOT 1.2 1.1 1.0  PROT 8.0 6.9 5.7*  ALBUMIN 4.6 3.7 3.4*    Recent Labs Lab 08/25/14 1023  LIPASE 34   No results for input(s): AMMONIA in the last 168 hours.  CBC:  Recent Labs Lab 08/25/14 1023 08/26/14 2010 08/27/14 0523  WBC 8.3 8.0 7.7  NEUTROABS 7.2  --   --   HGB 16.9* 14.7 14.0  HCT 50.7* 44.8 44.0  MCV 90.4 92.8 92.1  PLT 193 149* 155    Cardiac Enzymes:  Recent Labs Lab 08/25/14 0002 08/25/14 1728 08/26/14 0530  TROPONINI 0.03 <0.03 <0.03   BNP (last 3 results) No results for input(s): BNP in the last 8760 hours.  ProBNP (last 3 results) No results for input(s): PROBNP in the last 8760 hours.    CBG:  Recent Labs Lab 08/26/14 1642 08/26/14 1945 08/26/14 2308 08/27/14 0405 08/27/14 1157  GLUCAP 110* 107* 95 96 109*    No results found for this or any previous visit (from the past 240 hour(s)).   Studies: Ct Abdomen Pelvis Wo Contrast  08/25/2014   CLINICAL DATA:  Sharp abdominal pain and vomiting  EXAM: CT ABDOMEN AND PELVIS WITHOUT CONTRAST  TECHNIQUE: Multidetector CT imaging of the abdomen and pelvis was performed following the standard protocol without IV contrast.  COMPARISON:  11/11/2012  FINDINGS: The lung bases are free of acute infiltrate or sizable effusion. A small pericardial effusion is noted. The cardiac shadow is enlarged.  The liver, gallbladder, spleen, adrenal glands and pancreas are  within normal limits. The kidneys are within normal limits without evidence of calculi or obstructive change. 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  Significant distension of the stomach and proximal small bowel is identified. This continues distally to the right lower quadrant and on image number 46 of series 2 there is an abrupt transition identified with more normal small bowel noted beyond this region. This area lies in an area of previous scar in the anterior abdominal wall and is likely related to adhesions. The more distal small bowel as well as the colon appear within normal limits.  The bladder is partially distended. The uterus and appendix have been surgically removed. Minimal free fluid is noted within the pelvis. Diffuse aortoiliac calcifications  are seen without aneurysmal dilatation. Degenerative changes of the lumbar spine are seen. No acute compression deformity is noted.  IMPRESSION: Significant distention of the stomach and small bowel to an area in the right lower quadrant. The overlying skin is attenuated likely related to prior postsurgical scar. This obstructive changes likely related to postoperative adhesions.  Although not mentioned in the body of the report the majority of the small bowel lies within the right abdomen likely related to partial malrotation. These changes are stable from a prior study.  No other acute abnormality is seen.   Electronically Signed   By: Inez Catalina M.D.   On: 08/25/2014 14:03   Dg Abd 1 View  08/27/2014   CLINICAL DATA:  Abdominal pain and distention. Small bowel obstruction.  EXAM: ABDOMEN - 1 VIEW  COMPARISON:  08/26/2014  FINDINGS: Previously seen nasogastric tube is no longer visualized. Small bowel and colonic gas is seen, but no dilated bowel loops are seen on today's study. Gas and stool seen throughout the entire colon to the level the rectum.  IMPRESSION:  No evidence of dilated bowel loops or other acute findings.   Electronically Signed   By: Earle Gell M.D.   On: 08/27/2014 10:18   Dg Abd 1 View  08/26/2014   CLINICAL DATA:  Subsequent evaluation of small bowel obstruction with nausea  EXAM: ABDOMEN - 1 VIEW  COMPARISON:  01/03/2014  FINDINGS: NG tube projects over the stomach. There is gas throughout small and large bowel. There are no particularly dilated loops of bowel. Gas is seen into the rectum. There are a few clinically dilated loops of small bowel in the mid abdomen measuring up to about 3.7 cm.  IMPRESSION: Further improvement in small bowel obstruction.   Electronically Signed   By: Skipper Cliche M.D.   On: 08/26/2014 14:37    Scheduled Meds: . diltiazem  360 mg Oral Daily  . insulin aspart  0-9 Units Subcutaneous 6 times per day  . levothyroxine  56 mcg Intravenous QAC breakfast  . senna-docusate  1 tablet Oral BID  . sodium chloride  3 mL Intravenous Q12H  . white petrolatum       Continuous Infusions: . heparin 1,100 Units/hr (08/26/14 2222)    Principal Problem:   Bowel obstruction Active Problems:   SBO (small bowel obstruction)   Nausea & vomiting   Hypothyroidism   Hyperlipidemia   OSA (obstructive sleep apnea)   HX: breast cancer   H/O ovarian cancer   Hx of bladder cancer   Essential hypertension   Small bowel obstruction   Atrial fibrillation with RVR   Acute renal failure   Diastolic heart failure    Time spent: 40 minutes   [place] [facility] Pager [phone]. If 7PM-7AM, please contact night-coverage at [email or web address], password [facility] 08/27/2014, 1:38 PM  LOS: 2 days

## 2014-08-27 NOTE — Progress Notes (Signed)
Utilization review completed.  

## 2014-08-27 NOTE — Clinical Social Work Placement (Addendum)
Clinical Social Work Department CLINICAL SOCIAL WORK PLACEMENT NOTE 08/27/2014  Patient:  [name], [name]  Account Number:  [id number] Admit date:  08/25/2014  Clinical Social Worker:  Vencent Hauschild Givens, LCSW  Date/time:  08/27/2014 01:11 AM  Clinical Social Work is seeking post-discharge placement for this patient at the following level of care:   SKILLED NURSING   (*CSW will update this form in Epic as items are completed)   08/27/2014  Patient/family provided with [place] Department of Clinical Social Work's list of facilities offering this level of care within the geographic area requested by the patient (or if unable, by the patient's family).  08/26/2014  Patient/family informed of their freedom to choose among providers that offer the needed level of care, that participate in Medicare, Medicaid or managed care program needed by the patient, have an available bed and are willing to accept the patient.    Patient/family informed of [facility]' ownership interest in [facility], as well as of the fact that they are under no obligation to receive care at this facility.  PASARR submitted to EDS on 05/14/2007 PASARR number received on 05/14/2007  FL2 transmitted to all facilities in geographic area requested by pt/family on  08/26/2014 FL2 transmitted to all facilities within larger geographic area on   Patient informed that his/her managed care company has contracts with or will negotiate with  certain facilities, including the following:     Patient/family informed of bed offers received:  08/27/2014 Patient chooses bed at [facility] Physician recommends and patient chooses bed at    Patient to be transferred to [facility] on 08/31/14   Patient to be transferred to facility by ambulance Patient and family notified of transfer on 08/31/14 Name of family member notified: Casandra Doffing - daughter  The following physician request were entered in  Epic:  Additional Comments: 08/27/14: CSW talked with [facility] admissions director Deirdre Pippins regarding patient as they made a bed offer. Janie asked to be contacted if patient ready for d/c over weekend.    Ulyssa Walthour Givens, MSW, LCSW Licensed Clinical Social Worker [place] [phone]

## 2014-08-27 NOTE — Progress Notes (Signed)
ANTICOAGULATION CONSULT NOTE - Follow Up Consult  Pharmacy Consult for heparin Indication: atrial fibrillation  Allergies  Allergen Reactions  . Codeine     unknown  . Morphine And Related     sick  . Statins     sick  . Zetia [Ezetimibe] Other (See Comments)    Side effect to strong     Patient Measurements: Height: 5\' 1"  (154.9 cm) Weight: 199 lb (90.266 kg) IBW/kg (Calculated) : 47.8 Heparin Dosing Weight: 71 kg  Vital Signs: Temp: 98.5 F (36.9 C) (02/26 0856) Temp Source: Oral (02/26 0856) BP: 154/81 mmHg (02/26 1158) Pulse Rate: 101 (02/26 1158)  Labs:  Recent Labs  08/25/14 0002  08/25/14 1023  08/25/14 1728  08/26/14 0530 08/26/14 0935 08/26/14 2010 08/27/14 0523 08/27/14 0544 08/27/14 1310  HGB  --   < > 16.9*  --   --   --   --   --  14.7 14.0  --   --   HCT  --   --  50.7*  --   --   --   --   --  44.8 44.0  --   --   PLT  --   --  193  --   --   --   --   --  149* 155  --   --   APTT  --   --   --   < >  --   < >  --  45* 115*  --   --  70*  HEPARINUNFRC  --   --   --   --  >2.20*  < >  --  >2.20* >2.00*  --  1.74*  --   CREATININE  --   --  1.32*  --   --   --  1.26*  --   --  1.03  --   --   TROPONINI 0.03  --   --   --  <0.03  --  <0.03  --   --   --   --   --   < > = values in this interval not displayed.  Estimated Creatinine Clearance: 35.7 mL/min (by C-G formula based on Cr of 1.03).  Assessment: Patient is a 81 yo female with history of Afib on apixaban presents with emesis and abdominal pain. Pharmacy is consulted to dose heparin for Afib while patient is NPO and off Eliquis.  Heparin level was 1.74 this AM but this is due to residual effect of Eliquis. Hence, will adjust heparin rate base on aPTT for now. APTT is therapeutic at 70.  No bleeding documented.  Goal of Therapy:  Heparin level 0.3-0.7 units/ml aPTT 66-102 seconds Monitor platelets by anticoagulation protocol: Yes   Plan:  - continue heparin drip at 1100  units/hr   Abbygael Curtiss P 08/27/2014,2:26 PM

## 2014-08-27 NOTE — Progress Notes (Addendum)
NG tube unexpectedly removed. Only 40 cc of brown thin output collected tonight. Pt refused to have NG tube to be reinserted. Raliegh Ip Schorr notified. No new orders at this time. Will continue to monitor. Velora Mediate

## 2014-08-27 NOTE — Progress Notes (Signed)
RT Note: Patient refused CPAP at this time.  Patient encouraged to call for Respiratory if she wishes to used CPAP during her stay.  RN aware.

## 2014-08-27 NOTE — Progress Notes (Signed)
Occupational Therapy Treatment Patient Details Name: [name] MRN: [id number] DOB: [birth year] Today's Date: 08/27/2014    History of present illness Pt is a 81 y.o. female, with a PMH of 16 bowel obstructions in the past 5 years, diastolic heart failure, A. fib, ovarian cancer, bladder cancer, and obstructive sleep apnea on C Pap. She presents to the ER with concern for bowel obstruction. Her daughter who is a former O[employer]. Marine scientist at [employer], reports that she began vomiting yesterday afternoon. The vomiting has been persistent she is unable to keep down even liquids. She has not had a bowel movement in several days. Her daughter states that the patient absolutely does not want surgery her previous bowel obstructions have been managed conservatively. Unfortunately in the ER she appears to be in A. fib with RVR. Per her daughter the patient is a DO NOT RESUSCITATE. CAT scan of the abdomen and pelvis shows small bowel obstruction, she is dehydrated and has mild acute renal failure.   OT comments  This 81 yo female making progress with BADLs with AE use. Will continue to benefit from acute OT with follow up OT at SNF to get to a S level or better.  Follow Up Recommendations  SNF    Equipment Recommendations   (TBD at next venue)       Precautions / Restrictions Precautions Precautions: Fall Restrictions Weight Bearing Restrictions: No       Mobility Bed Mobility               General bed mobility comments: Pt up in recliner upon arrival  Transfers Overall transfer level: Needs assistance   Transfers: Sit to/from Stand Sit to Stand: Min assist                  ADL Overall ADL's : Needs assistance/impaired                     Lower Body Dressing: Minimal assistance;Sit to/from stand Lower Body Dressing Details (indicate cue type and reason): shoes with elastic laces and long shoe horn. Issued pt a pair of black elastic shoe laces               General  ADL Comments: Pt says she normally stands to get her shoes started on by holding onto RW and bed post, she does this since this is the only way she can get the angle she needs to get her shoes started. I had her try it in sitting and standing and definitely could see how it was easier for her to get them started. We usually do not recommend a pt of her age and balance issues to do it this way but it works for her and she appears safe doing it this way.                Cognition   Behavior During Therapy: WFL for tasks assessed/performed Overall Cognitive Status: Within Functional Limits for tasks assessed                                    Pertinent Vitals/ Pain       Pain Assessment: No/denies pain         Frequency Min 2X/week     Progress Toward Goals  OT Goals(current goals can now be found in the care plan section)  Progress towards OT goals: Progressing toward goals  Plan Discharge plan remains appropriate       End of Session Equipment Utilized During Treatment: Rolling walker   Activity Tolerance Patient tolerated treatment well   Patient Left in chair;with call bell/phone within reach;with chair alarm set   Nurse Communication          Time: 4996-9249 OT Time Calculation (min): 10 min  Charges: OT General Charges $OT Visit: 1 Procedure OT Treatments $Self Care/Home Management : 8-22 mins  Almon Register [phone] 08/27/2014, 4:04 PM

## 2014-08-28 LAB — GLUCOSE, CAPILLARY
Glucose-Capillary: 105 mg/dL — ABNORMAL HIGH (ref 70–99)
Glucose-Capillary: 106 mg/dL — ABNORMAL HIGH (ref 70–99)
Glucose-Capillary: 111 mg/dL — ABNORMAL HIGH (ref 70–99)
Glucose-Capillary: 116 mg/dL — ABNORMAL HIGH (ref 70–99)
Glucose-Capillary: 121 mg/dL — ABNORMAL HIGH (ref 70–99)
Glucose-Capillary: 97 mg/dL (ref 70–99)
Glucose-Capillary: 99 mg/dL (ref 70–99)

## 2014-08-28 LAB — CBC
HCT: 45.8 % (ref 36.0–46.0)
HEMOGLOBIN: 15.1 g/dL — AB (ref 12.0–15.0)
MCH: 30.3 pg (ref 26.0–34.0)
MCHC: 33 g/dL (ref 30.0–36.0)
MCV: 91.8 fL (ref 78.0–100.0)
PLATELETS: 151 10*3/uL (ref 150–400)
RBC: 4.99 MIL/uL (ref 3.87–5.11)
RDW: 15.3 % (ref 11.5–15.5)
WBC: 9.2 10*3/uL (ref 4.0–10.5)

## 2014-08-28 LAB — APTT: APTT: 61 s — AB (ref 24–37)

## 2014-08-28 LAB — HEPARIN LEVEL (UNFRACTIONATED): Heparin Unfractionated: 1.06 IU/mL — ABNORMAL HIGH (ref 0.30–0.70)

## 2014-08-28 MED ORDER — LEVOTHYROXINE SODIUM 112 MCG PO TABS
224.0000 ug | ORAL_TABLET | Freq: Every day | ORAL | Status: DC
  Administered 2014-08-29 – 2014-08-30 (×2): 224 ug via ORAL
  Filled 2014-08-28 (×3): qty 2

## 2014-08-28 MED ORDER — HEPARIN (PORCINE) IN NACL 100-0.45 UNIT/ML-% IJ SOLN
1200.0000 [IU]/h | INTRAMUSCULAR | Status: DC
  Filled 2014-08-28: qty 250

## 2014-08-28 MED ORDER — APIXABAN 5 MG PO TABS: 5.0000 mg | ORAL_TABLET | Freq: Two times a day (BID) | ORAL | Status: DC

## 2014-08-28 MED ORDER — LEVOTHYROXINE SODIUM 100 MCG IV SOLR
112.0000 ug | Freq: Every day | INTRAVENOUS | Status: DC
  Filled 2014-08-28: qty 10

## 2014-08-28 MED ORDER — BISACODYL 5 MG PO TBEC
5.0000 mg | DELAYED_RELEASE_TABLET | Freq: Every day | ORAL | Status: DC
  Administered 2014-08-28 – 2014-08-31 (×4): 5 mg via ORAL
  Filled 2014-08-28 (×4): qty 1

## 2014-08-28 MED ORDER — APIXABAN 5 MG PO TABS
5.0000 mg | ORAL_TABLET | Freq: Two times a day (BID) | ORAL | Status: DC
  Administered 2014-08-28 – 2014-08-31 (×7): 5 mg via ORAL
  Filled 2014-08-28 (×8): qty 1

## 2014-08-28 NOTE — Progress Notes (Signed)
Pt refuses to wear CPAP tonight.

## 2014-08-28 NOTE — Progress Notes (Addendum)
ANTICOAGULATION CONSULT NOTE - Follow Up Consult  Pharmacy Consult for Heparin Indication: atrial fibrillation  Allergies  Allergen Reactions  . Codeine     unknown  . Morphine And Related     sick  . Statins     sick  . Zetia [Ezetimibe] Other (See Comments)    Side effect to strong     Patient Measurements: Height: 5\' 1"  (154.9 cm) Weight: 194 lb (87.998 kg) IBW/kg (Calculated) : 47.8 Heparin Dosing Weight: 71 kg  Vital Signs: Temp: 97.9 F (36.6 C) (02/27 0500) Temp Source: Oral (02/27 0500) BP: 127/85 mmHg (02/27 0500) Pulse Rate: 62 (02/27 0500)  Labs:  Recent Labs  08/25/14 1023  08/25/14 1728  08/26/14 0530  08/26/14 2010 08/27/14 0523 08/27/14 0544 08/27/14 1310 08/28/14 0542  HGB 16.9*  --   --   --   --   --  14.7 14.0  --   --  15.1*  HCT 50.7*  --   --   --   --   --  44.8 44.0  --   --  45.8  PLT 193  --   --   --   --   --  149* 155  --   --  151  APTT  --   < >  --   < >  --   < > 115*  --   --  70* 61*  HEPARINUNFRC  --   --  >2.20*  < >  --   < > >2.00*  --  1.74*  --  1.06*  CREATININE 1.32*  --   --   --  1.26*  --   --  1.03  --   --   --   TROPONINI  --   --  <0.03  --  <0.03  --   --   --   --   --   --   < > = values in this interval not displayed.  Estimated Creatinine Clearance: 35.2 mL/min (by C-G formula based on Cr of 1.03).   Medications:  Scheduled:  . diltiazem  360 mg Oral Daily  . insulin aspart  0-9 Units Subcutaneous 6 times per day  . levothyroxine  56 mcg Intravenous QAC breakfast  . senna-docusate  1 tablet Oral BID  . sodium chloride  3 mL Intravenous Q12H    Assessment: Patient is a 81 yo female with history of Afib on apixaban presents with emesis and abdominal pain. Pharmacy is consulted to dose heparin for Afib while patient is NPO and off Eliquis. Heparin level was 1.06 this AM but this is due to residual effect of Eliquis. Hence, will adjust heparin rate base on aPTT for now. APTT has trended down and is now  slightly subtherapeutic at 61 while on 1100 units/hr. No bleeding documented.  Will increase Heparin to 1200 units/hr.  Spoke with Dr. Allyson Sabal, she is uncertain when she will change patient to apixaban.  With this knowledge, will check 8-hour HL and aPTT to ensure patient is not supratherapeutic.  Goal of Therapy:  Heparin level 0.3-0.7 units/ml  APTT 66-102 seconds Monitor platelets by anticoagulation protocol: Yes   Plan:  - Increase heparin to 1200 units/hr - Check 8-hour HL, aPTT - Dr. Allyson Sabal is unsure if she will restart apixaban today or not.  She will order when she feels it is appropriate.  Theron Arista, PharmD Clinical Pharmacist - Resident Pager: [phone] 2/27/20168:14 AM   Addendum: 08/28/14 @  1320  Dr. Allyson Sabal discontinued heparin.  Pharmacy consulted to restart Apixaban in this patient for a history of Afib.  Patient's SCr 1.03, Wt. 88 kg.  Heparin was stopped ~1240.  Will start Apixaban 5 mg BID.  Theron Arista, PharmD Clinical Pharmacist - Resident Pager: [phone] 2/27/20161:21 PM

## 2014-08-28 NOTE — Accreditation Note (Signed)
Pt refused CPAP

## 2014-08-28 NOTE — Progress Notes (Addendum)
[facility] PROGRESS NOTE  [name] DOB: Jul 06, 1919 DOA: 08/25/2014 PCP: [name] Pac, MD  Assessment/Plan: Principal Problem:   Bowel obstruction Active Problems:   SBO (small bowel obstruction)   Nausea & vomiting   Hypothyroidism   Hyperlipidemia   OSA (obstructive sleep apnea)   HX: breast cancer   H/O ovarian cancer   Hx of bladder cancer   Essential hypertension   Small bowel obstruction   Atrial fibrillation with RVR   Acute renal failure   Diastolic heart failure    Small bowel obstruction -improving due to adhesion and known anatomical abnormality. Conservative management with bowel rest. NG tube discontinued KUB improving, status post 2 bowel movements Maintain potassium between 4.5-5. Advancing to a soft diet Continue stool softeners and suppositories, restarted by mouth Dulcolax  Diastolic heart failure Patient is currently dehydrated but does have some minimal swelling in her lower extremities. Discontinue IV hydration, patient is eating and drinking  Last echo 6/15, LVEF 50-55%.  Acute renal failure-improving Secondary to dehydration and vomiting. Patient able to tolerate fluids there for DC IV fluids  A. fib with RVR Cont iv metoprolol when necessary Restarted by mouth Cardizem DC heparin, restart Eliquis   Hyperglycemia Sliding scale insulin sensitive with every 4 hours CBGs. Hemoglobin A1c 5.5   Chest pain Likely GI in origin .  Negative  Troponins.start Protonix   Hypothyroidism Switch to by mouth Synthroid  Obstructive sleep apnea C Pap daily at bedtime.  Code Status:DNR  Family Communication: [name] updated  Disposition Plan: Continue to slowly advance diet    Brief narrative: [name] is a 81 y.o. female, with a past medical history of 16 bowel obstructions in the past 5 years, and diastolic heart failure, A. fib, ovarian cancer, bladder cancer, and obstructive sleep apnea on C  Pap. She presents to the ER with about obstruction. Her daughter who is a former O. R. nurse at [employer], reports that she began vomiting yesterday afternoon. The vomiting has been persistent she is unable to keep down even liquids. She has not had a bowel movement in several days. Her daughter states that the patient absolutely does not want surgery her previous bowel obstructions have been managed conservatively. She also states that her mother had a bad GI bleed from her previous nasogastric tube, so if possible they would like to avoid an NG tube, she needs it. She is normally on Eliquis and rate control medications for A. fib. Unfortunately in the ER she appears to be in A. fib with RVR. Per her daughter the patient is a DO NOT RESUSCITATE.  CAT scan of the abdomen and pelvis shows small bowel obstruction, she is dehydrated and has mild acute renal failure with an elevation in her creatinine from 0.8 to 1.32. Lactic acid is 2.54.    Consultants:  none  Procedures: none  Antibiotics: none  HPI/Subjective: No complaints had 2 bowel movements yesterday, excited about being able to eat  Objective: Filed Vitals:   08/27/14 1500 08/27/14 2047 08/28/14 0500 08/28/14 1000  BP: 133/64 147/72 127/85 139/80  Pulse: 77 90 62 68  Temp: 98.7 F (37.1 C) 98.5 F (36.9 C) 97.9 F (36.6 C) 98.1 F (36.7 C)  TempSrc: Oral Oral Oral Oral  Resp: 18 18 20 20   Height:      Weight:  87.998 kg (194 lb)    SpO2: 94% 94% 91% 93%    Intake/Output Summary (Last 24 hours) at 08/28/14 1230 Last data filed  at 08/28/14 0920  Gross per 24 hour  Intake 2127.5 ml  Output    210 ml  Net 1917.5 ml    Exam:  General: No acute respiratory distress Lungs: Clear to auscultation bilaterally without wheezes or crackles Cardiovascular: Regular rate and rhythm without murmur gallop or rub normal S1 and S2 Abdomen: Nontender, nondistended, soft, bowel sounds positive, no rebound, no ascites, no appreciable  mass Extremities: No significant cyanosis, clubbing, or edema bilateral lower extremities      Data Reviewed: Basic Metabolic Panel:  Recent Labs Lab 08/25/14 1023 08/26/14 0530 08/27/14 0523  NA 140 141 144  K 4.5 4.0 3.6  CL 102 108 114*  CO2 25 23 23   GLUCOSE 138* 112* 112*  BUN 30* 45* 36*  CREATININE 1.32* 1.26* 1.03  CALCIUM 9.9 8.9 8.0*    Liver Function Tests:  Recent Labs Lab 08/25/14 1023 08/26/14 1214 08/27/14 0523  AST 32 25 21  ALT 20 15 16   ALKPHOS 78 61 55  BILITOT 1.2 1.1 1.0  PROT 8.0 6.9 5.7*  ALBUMIN 4.6 3.7 3.4*    Recent Labs Lab 08/25/14 1023  LIPASE 34   No results for input(s): AMMONIA in the last 168 hours.  CBC:  Recent Labs Lab 08/25/14 1023 08/26/14 2010 08/27/14 0523 08/28/14 0542  WBC 8.3 8.0 7.7 9.2  NEUTROABS 7.2  --   --   --   HGB 16.9* 14.7 14.0 15.1*  HCT 50.7* 44.8 44.0 45.8  MCV 90.4 92.8 92.1 91.8  PLT 193 149* 155 151    Cardiac Enzymes:  Recent Labs Lab 08/25/14 0002 08/25/14 1728 08/26/14 0530  TROPONINI 0.03 <0.03 <0.03   BNP (last 3 results) No results for input(s): BNP in the last 8760 hours.  ProBNP (last 3 results) No results for input(s): PROBNP in the last 8760 hours.    CBG:  Recent Labs Lab 08/27/14 1643 08/27/14 2023 08/27/14 2357 08/28/14 0421 08/28/14 0740  GLUCAP 93 104* 86 106* 116*    No results found for this or any previous visit (from the past 240 hour(s)).   Studies: Ct Abdomen Pelvis Wo Contrast  08/25/2014   CLINICAL DATA:  Sharp abdominal pain and vomiting  EXAM: CT ABDOMEN AND PELVIS WITHOUT CONTRAST  TECHNIQUE: Multidetector CT imaging of the abdomen and pelvis was performed following the standard protocol without IV contrast.  COMPARISON:  11/11/2012  FINDINGS: The lung bases are free of acute infiltrate or sizable effusion. A small pericardial effusion is noted. The cardiac shadow is enlarged.  The liver, gallbladder, spleen, adrenal glands and pancreas  are within normal limits. The kidneys are within normal limits without evidence of calculi or obstructive change. 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  Significant distension of the stomach and proximal small bowel is identified. This continues distally to the right lower quadrant and on image number 46 of series 2 there is an abrupt transition identified with more normal small bowel noted beyond this region. This area lies in an area of previous scar in the anterior abdominal wall and is likely related to adhesions. The more distal small bowel as well as the colon appear within normal limits.  The bladder is partially distended. The uterus and appendix have been surgically removed. Minimal free fluid is noted within the pelvis. Diffuse aortoiliac calcifications are seen without aneurysmal dilatation. Degenerative changes of the lumbar spine are  seen. No acute compression deformity is noted.  IMPRESSION: Significant distention of the stomach and small bowel to an area in the right lower quadrant. The overlying skin is attenuated likely related to prior postsurgical scar. This obstructive changes likely related to postoperative adhesions.  Although not mentioned in the body of the report the majority of the small bowel lies within the right abdomen likely related to partial malrotation. These changes are stable from a prior study.  No other acute abnormality is seen.   Electronically Signed   By: Inez Catalina M.D.   On: 08/25/2014 14:03   Dg Abd 1 View  08/27/2014   CLINICAL DATA:  Abdominal pain and distention. Small bowel obstruction.  EXAM: ABDOMEN - 1 VIEW  COMPARISON:  08/26/2014  FINDINGS: Previously seen nasogastric tube is no longer visualized. Small bowel and colonic gas is seen, but no dilated bowel loops are seen on today's study. Gas and stool seen throughout the entire colon to the level the rectum.   IMPRESSION: No evidence of dilated bowel loops or other acute findings.   Electronically Signed   By: Earle Gell M.D.   On: 08/27/2014 10:18   Dg Abd 1 View  08/26/2014   CLINICAL DATA:  Subsequent evaluation of small bowel obstruction with nausea  EXAM: ABDOMEN - 1 VIEW  COMPARISON:  01/03/2014  FINDINGS: NG tube projects over the stomach. There is gas throughout small and large bowel. There are no particularly dilated loops of bowel. Gas is seen into the rectum. There are a few clinically dilated loops of small bowel in the mid abdomen measuring up to about 3.7 cm.  IMPRESSION: Further improvement in small bowel obstruction.   Electronically Signed   By: Skipper Cliche M.D.   On: 08/26/2014 14:37    Scheduled Meds: . diltiazem  360 mg Oral Daily  . insulin aspart  0-9 Units Subcutaneous 6 times per day  . senna-docusate  1 tablet Oral BID  . sodium chloride  3 mL Intravenous Q12H   Continuous Infusions:    Principal Problem:   Bowel obstruction Active Problems:   SBO (small bowel obstruction)   Nausea & vomiting   Hypothyroidism   Hyperlipidemia   OSA (obstructive sleep apnea)   HX: breast cancer   H/O ovarian cancer   Hx of bladder cancer   Essential hypertension   Small bowel obstruction   Atrial fibrillation with RVR   Acute renal failure   Diastolic heart failure    Time spent: 40 minutes   [facility] Pager [phone]. If 7PM-7AM, please contact night-coverage at [email or web address], password [facility] 08/28/2014, 12:30 PM  LOS: 3 days

## 2014-08-29 ENCOUNTER — Inpatient Hospital Stay (HOSPITAL_COMMUNITY): Payer: Medicare Other

## 2014-08-29 DIAGNOSIS — I5031 Acute diastolic (congestive) heart failure: Secondary | ICD-10-CM

## 2014-08-29 LAB — BASIC METABOLIC PANEL
Anion gap: 6 (ref 5–15)
BUN: 13 mg/dL (ref 6–23)
CO2: 23 mmol/L (ref 19–32)
Calcium: 8.5 mg/dL (ref 8.4–10.5)
Chloride: 110 mmol/L (ref 96–112)
Creatinine, Ser: 0.67 mg/dL (ref 0.50–1.10)
GFR calc Af Amer: 86 mL/min — ABNORMAL LOW (ref >90)
GFR calc non Af Amer: 74 mL/min — ABNORMAL LOW (ref >90)
Glucose, Bld: 116 mg/dL — ABNORMAL HIGH (ref 70–99)
Potassium: 4.5 mmol/L (ref 3.5–5.1)
Sodium: 139 mmol/L (ref 135–145)

## 2014-08-29 LAB — GLUCOSE, CAPILLARY
GLUCOSE-CAPILLARY: 99 mg/dL (ref 70–99)
GLUCOSE-CAPILLARY: 100 mg/dL — AB (ref 70–99)
GLUCOSE-CAPILLARY: 105 mg/dL — AB (ref 70–99)
Glucose-Capillary: 99 mg/dL (ref 70–99)
Glucose-Capillary: 96 mg/dL (ref 70–99)

## 2014-08-29 MED ORDER — FUROSEMIDE 10 MG/ML IJ SOLN
40.0000 mg | Freq: Every day | INTRAMUSCULAR | Status: DC
  Administered 2014-08-29 – 2014-08-30 (×2): 40 mg via INTRAVENOUS
  Filled 2014-08-29 (×2): qty 4

## 2014-08-29 NOTE — Progress Notes (Signed)
Pt refuses to wear CPAP at night. 

## 2014-08-29 NOTE — Progress Notes (Signed)
ANTICOAGULATION CONSULT NOTE - Follow Up Consult  Pharmacy Consult for Apixaban Indication: atrial fibrillation  Allergies  Allergen Reactions  . Codeine     unknown  . Morphine And Related     sick  . Statins     sick  . Zetia [Ezetimibe] Other (See Comments)    Side effect to strong     Patient Measurements: Height: 5\' 1"  (154.9 cm) Weight: 217 lb 4.8 oz (98.567 kg) IBW/kg (Calculated) : 47.8 Heparin Dosing Weight: 71 kg  Vital Signs: Temp: 98.4 F (36.9 C) (02/28 1000) Temp Source: Oral (02/28 1000) BP: 154/87 mmHg (02/28 1000) Pulse Rate: 60 (02/28 1000)  Labs:  Recent Labs  08/26/14 2010 08/27/14 0523 08/27/14 0544 08/27/14 1310 08/28/14 0542  HGB 14.7 14.0  --   --  15.1*  HCT 44.8 44.0  --   --  45.8  PLT 149* 155  --   --  151  APTT 115*  --   --  70* 61*  HEPARINUNFRC >2.00*  --  1.74*  --  1.06*  CREATININE  --  1.03  --   --   --     Estimated Creatinine Clearance: 37.5 mL/min (by C-G formula based on Cr of 1.03).   Medications:  Scheduled:  . apixaban  5 mg Oral BID  . bisacodyl  5 mg Oral Daily  . diltiazem  360 mg Oral Daily  . furosemide  40 mg Intravenous Daily  . insulin aspart  0-9 Units Subcutaneous 6 times per day  . levothyroxine  224 mcg Oral QAC breakfast  . senna-docusate  1 tablet Oral BID  . sodium chloride  3 mL Intravenous Q12H    Assessment: Patient is a 81 yo female with history of Afib on apixaban presents with emesis and abdominal pain. Pharmacy was consulted to dose heparin for Afib while patient is NPO and off Eliquis. Heparin level was 1.06 this AM but this is due to residual effect of Eliquis. Hence, will adjust heparin rate base on aPTT for now. APTT has trended down and is now slightly subtherapeutic at 61 while on 1100 units/hr. Heparin has now been discontinued, apixaban started 2/27.  No bleeding documented.    Goal of Therapy:  Heparin level 0.3-0.7 units/ml  APTT 66-102 seconds Monitor platelets by  anticoagulation protocol: Yes   Plan:  - Continue Apixaban 5 mg PO BID - Monitor renal function - Monitor for s/sx of bleeding  Hassie Bruce, Pharm. D. Clinical Pharmacy Resident Pager: [phone] Ph: [phone] 08/29/2014 11:26 AM

## 2014-08-29 NOTE — Progress Notes (Signed)
[facility] PROGRESS NOTE  [name]:[id number] DOB: [birth year] DOA: 08/25/2014 PCP: [name], MD  Assessment/Plan: Principal Problem:   Bowel obstruction Active Problems:   SBO (small bowel obstruction)   Nausea & vomiting   Hypothyroidism   Hyperlipidemia   OSA (obstructive sleep apnea)   HX: breast cancer   H/O ovarian cancer   Hx of bladder cancer   Essential hypertension   Small bowel obstruction   Atrial fibrillation with RVR   Acute renal failure   Diastolic heart failure   Small bowel obstruction -improving due to adhesion and known anatomical abnormality. Conservative management with bowel rest. NG tube discontinued KUB improving, status post 2 bowel movements Maintain potassium between 4.5-5. Tolerating soft diet Continue stool softeners and suppositories, restarted by mouth Dulcolax Had BM 2-27  Acute on chronic Diastolic heart failure Last echo 6/15, LVEF 50-55%. Patient with dyspnea, mild hypoxemia.  Will start IV lasix 40 mg IV daily.  Check Chest x ray/  Follow renal function.   Acute renal failure-improving Secondary to dehydration and vomiting. Patient able to tolerate fluids there for DC IV fluids Monitor on lasix.   A. fib with RVR Cont iv metoprolol when necessary Restarted by mouth Cardizem DC heparin, restarted Eliquis   Hyperglycemia Sliding scale insulin sensitive with every 4 hours CBGs. Hemoglobin A1c 5.5   Chest pain Likely GI in origin .  Negative  Troponins.start Protonix   Hypothyroidism Switch to by mouth Synthroid  Obstructive sleep apnea C Pap daily at bedtime.  Code Status:DNR  Family Communication: chris [phone] updated  Disposition Plan: Continue to slowly advance diet    Brief narrative: [name] is a 81 y.o. female, with a past medical history of 16 bowel obstructions in the past 5 years, and diastolic heart failure, A. fib, ovarian cancer, bladder cancer, and obstructive  sleep apnea on C Pap. She presents to the ER with about obstruction. Her daughter who is a former O. R. nurse at [employer], reports that she began vomiting yesterday afternoon. The vomiting has been persistent she is unable to keep down even liquids. She has not had a bowel movement in several days. Her daughter states that the patient absolutely does not want surgery her previous bowel obstructions have been managed conservatively. She also states that her mother had a bad GI bleed from her previous nasogastric tube, so if possible they would like to avoid an NG tube, she needs it. She is normally on Eliquis and rate control medications for A. fib. Unfortunately in the ER she appears to be in A. fib with RVR. Per her daughter the patient is a DO NOT RESUSCITATE.  CAT scan of the abdomen and pelvis shows small bowel obstruction, she is dehydrated and has mild acute renal failure with an elevation in her creatinine from 0.8 to 1.32. Lactic acid is 2.54.    Consultants:  none  Procedures: none  Antibiotics: none  HPI/Subjective: Had BM yesterday.  Relates mild dyspnea, started last night.  Denies chest pain.   Objective: Filed Vitals:   08/28/14 2152 08/28/14 2154 08/29/14 0530 08/29/14 1000  BP: 141/70  140/64 154/87  Pulse: 67  45 60  Temp: 98.2 F (36.8 C)  97.5 F (36.4 C) 98.4 F (36.9 C)  TempSrc: Oral  Oral Oral  Resp: 18  16 18   Height:      Weight: 98.567 kg (217 lb 4.8 oz)     SpO2: 86% 93% 88% 90%    Intake/Output Summary (  Last 24 hours) at 08/29/14 1307 Last data filed at 08/29/14 0900  Gross per 24 hour  Intake    240 ml  Output    200 ml  Net     40 ml    Exam:  General: No acute respiratory distress Lungs: bilateral crackles, sporadic wheezing.  Cardiovascular: Regular rate and rhythm without murmur gallop or rub normal S1 and S2 Abdomen: Nontender, nondistended, soft, bowel sounds positive, no rebound, no ascites, no appreciable mass Extremities: bilateral  LE edema.       Data Reviewed: Basic Metabolic Panel:  Recent Labs Lab 08/25/14 1023 08/26/14 0530 08/27/14 0523  NA 140 141 144  K 4.5 4.0 3.6  CL 102 108 114*  CO2 25 23 23   GLUCOSE 138* 112* 112*  BUN 30* 45* 36*  CREATININE 1.32* 1.26* 1.03  CALCIUM 9.9 8.9 8.0*    Liver Function Tests:  Recent Labs Lab 08/25/14 1023 08/26/14 1214 08/27/14 0523  AST 32 25 21  ALT 20 15 16   ALKPHOS 78 61 55  BILITOT 1.2 1.1 1.0  PROT 8.0 6.9 5.7*  ALBUMIN 4.6 3.7 3.4*    Recent Labs Lab 08/25/14 1023  LIPASE 34   No results for input(s): AMMONIA in the last 168 hours.  CBC:  Recent Labs Lab 08/25/14 1023 08/26/14 2010 08/27/14 0523 08/28/14 0542  WBC 8.3 8.0 7.7 9.2  NEUTROABS 7.2  --   --   --   HGB 16.9* 14.7 14.0 15.1*  HCT 50.7* 44.8 44.0 45.8  MCV 90.4 92.8 92.1 91.8  PLT 193 149* 155 151    Cardiac Enzymes:  Recent Labs Lab 08/25/14 0002 08/25/14 1728 08/26/14 0530  TROPONINI 0.03 <0.03 <0.03   BNP (last 3 results) No results for input(s): BNP in the last 8760 hours.  ProBNP (last 3 results) No results for input(s): PROBNP in the last 8760 hours.    CBG:  Recent Labs Lab 08/28/14 1959 08/28/14 2354 08/29/14 0358 08/29/14 0742 08/29/14 1141  GLUCAP 111* 99 99 96 99    No results found for this or any previous visit (from the past 240 hour(s)).   Studies: Ct Abdomen Pelvis Wo Contrast  08/25/2014   CLINICAL DATA:  Sharp abdominal pain and vomiting  EXAM: CT ABDOMEN AND PELVIS WITHOUT CONTRAST  TECHNIQUE: Multidetector CT imaging of the abdomen and pelvis was performed following the standard protocol without IV contrast.  COMPARISON:  11/11/2012  FINDINGS: The lung bases are free of acute infiltrate or sizable effusion. A small pericardial effusion is noted. The cardiac shadow is enlarged.  The liver, gallbladder, spleen, adrenal glands and pancreas are within normal limits. The kidneys are within normal limits without evidence of  calculi or obstructive change. 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  Significant distension of the stomach and proximal small bowel is identified. This continues distally to the right lower quadrant and on image number 46 of series 2 there is an abrupt transition identified with more normal small bowel noted beyond this region. This area lies in an area of previous scar in the anterior abdominal wall and is likely related to adhesions. The more distal small bowel as well as the colon appear within normal limits.  The bladder is partially distended. The uterus and appendix have been surgically removed. Minimal free fluid is noted within the pelvis. Diffuse aortoiliac calcifications are seen without aneurysmal  dilatation. Degenerative changes of the lumbar spine are seen. No acute compression deformity is noted.  IMPRESSION: Significant distention of the stomach and small bowel to an area in the right lower quadrant. The overlying skin is attenuated likely related to prior postsurgical scar. This obstructive changes likely related to postoperative adhesions.  Although not mentioned in the body of the report the majority of the small bowel lies within the right abdomen likely related to partial malrotation. These changes are stable from a prior study.  No other acute abnormality is seen.   Electronically Signed   By: Inez Catalina M.D.   On: 08/25/2014 14:03   Dg Abd 1 View  08/27/2014   CLINICAL DATA:  Abdominal pain and distention. Small bowel obstruction.  EXAM: ABDOMEN - 1 VIEW  COMPARISON:  08/26/2014  FINDINGS: Previously seen nasogastric tube is no longer visualized. Small bowel and colonic gas is seen, but no dilated bowel loops are seen on today's study. Gas and stool seen throughout the entire colon to the level the rectum.  IMPRESSION: No evidence of dilated bowel loops or other acute findings.   Electronically  Signed   By: Earle Gell M.D.   On: 08/27/2014 10:18   Dg Abd 1 View  08/26/2014   CLINICAL DATA:  Subsequent evaluation of small bowel obstruction with nausea  EXAM: ABDOMEN - 1 VIEW  COMPARISON:  01/03/2014  FINDINGS: NG tube projects over the stomach. There is gas throughout small and large bowel. There are no particularly dilated loops of bowel. Gas is seen into the rectum. There are a few clinically dilated loops of small bowel in the mid abdomen measuring up to about 3.7 cm.  IMPRESSION: Further improvement in small bowel obstruction.   Electronically Signed   By: Skipper Cliche M.D.   On: 08/26/2014 14:37    Scheduled Meds: . apixaban  5 mg Oral BID  . bisacodyl  5 mg Oral Daily  . diltiazem  360 mg Oral Daily  . furosemide  40 mg Intravenous Daily  . insulin aspart  0-9 Units Subcutaneous 6 times per day  . levothyroxine  224 mcg Oral QAC breakfast  . senna-docusate  1 tablet Oral BID  . sodium chloride  3 mL Intravenous Q12H   Continuous Infusions:    Principal Problem:   Bowel obstruction Active Problems:   SBO (small bowel obstruction)   Nausea & vomiting   Hypothyroidism   Hyperlipidemia   OSA (obstructive sleep apnea)   HX: breast cancer   H/O ovarian cancer   Hx of bladder cancer   Essential hypertension   Small bowel obstruction   Atrial fibrillation with RVR   Acute renal failure   Diastolic heart failure    Time spent: 40 minutes   Sherlyne Crownover, [facility] Pager [phone]. If 7PM-7AM, please contact night-coverage at [email or web address], password [facility] 08/29/2014, 1:07 PM  LOS: 4 days

## 2014-08-30 DIAGNOSIS — K565 Intestinal adhesions [bands] with obstruction (postprocedural) (postinfection): Principal | ICD-10-CM

## 2014-08-30 DIAGNOSIS — I10 Essential (primary) hypertension: Secondary | ICD-10-CM

## 2014-08-30 LAB — GLUCOSE, CAPILLARY
GLUCOSE-CAPILLARY: 115 mg/dL — AB (ref 70–99)
Glucose-Capillary: 105 mg/dL — ABNORMAL HIGH (ref 70–99)
Glucose-Capillary: 107 mg/dL — ABNORMAL HIGH (ref 70–99)
Glucose-Capillary: 118 mg/dL — ABNORMAL HIGH (ref 70–99)
Glucose-Capillary: 127 mg/dL — ABNORMAL HIGH (ref 70–99)
Glucose-Capillary: 94 mg/dL (ref 70–99)
Glucose-Capillary: 97 mg/dL (ref 70–99)

## 2014-08-30 LAB — BASIC METABOLIC PANEL
Anion gap: 9 (ref 5–15)
BUN: 13 mg/dL (ref 6–23)
CALCIUM: 8.8 mg/dL (ref 8.4–10.5)
CO2: 25 mmol/L (ref 19–32)
Chloride: 106 mmol/L (ref 96–112)
Creatinine, Ser: 0.71 mg/dL (ref 0.50–1.10)
GFR calc Af Amer: 84 mL/min — ABNORMAL LOW (ref >90)
GFR calc non Af Amer: 73 mL/min — ABNORMAL LOW (ref >90)
GLUCOSE: 111 mg/dL — AB (ref 70–99)
Potassium: 3.5 mmol/L (ref 3.5–5.1)
SODIUM: 140 mmol/L (ref 135–145)

## 2014-08-30 MED ORDER — POTASSIUM CHLORIDE CRYS ER 20 MEQ PO TBCR
40.0000 meq | EXTENDED_RELEASE_TABLET | Freq: Once | ORAL | Status: AC
  Administered 2014-08-30: 40 meq via ORAL
  Filled 2014-08-30: qty 2

## 2014-08-30 MED ORDER — FLEET ENEMA 7-19 GM/118ML RE ENEM
1.0000 | ENEMA | Freq: Every day | RECTAL | Status: DC | PRN
  Filled 2014-08-30: qty 1

## 2014-08-30 MED ORDER — LEVOTHYROXINE SODIUM 88 MCG PO TABS
238.5000 ug | ORAL_TABLET | Freq: Every day | ORAL | Status: DC
  Administered 2014-08-31: 238.5 ug via ORAL
  Filled 2014-08-30 (×2): qty 0.5

## 2014-08-30 NOTE — Progress Notes (Signed)
[facility] PROGRESS NOTE  [name] KNL:[id number] DOB: 08/04/1919 DOA: 08/25/2014 PCP: [name], MD  Assessment/Plan: Principal Problem:   Bowel obstruction Active Problems:   SBO (small bowel obstruction)   Nausea & vomiting   Hypothyroidism   Hyperlipidemia   OSA (obstructive sleep apnea)   HX: breast cancer   H/O ovarian cancer   Hx of bladder cancer   Essential hypertension   Small bowel obstruction   Atrial fibrillation with RVR   Acute renal failure   Diastolic heart failure   Small bowel obstruction -improving due to adhesion and known anatomical abnormality. Conservative management with bowel rest. NG tube discontinued Maintain potassium between 4.5-5. Tolerating soft diet Continue stool softeners and suppositories, restarted by mouth Dulcolax   Acute on chronic Diastolic heart failure Last echo 6/15, LVEF 50-55%. Patient with dyspnea, mild hypoxemia.  Will start IV lasix 40 mg IV daily.  Follow renal function.   Acute renal failure resolved  A. fib with RVR Restarted by mouth Cardizem DC heparin, restarted Eliquis   Hyperglycemia Sliding scale insulin sensitive with every 4 hours CBGs. Hemoglobin A1c 5.5  Chest pain Likely GI in origin .  Negative  Troponins.start Protonix   Hypothyroidism Switch to by mouth Synthroid  Acute resp failure -wean off O2  Obstructive sleep apnea C Pap daily at bedtime.  Code Status:DNR  Family Communication: chris [phone]- updated 2/28 Disposition Plan: SNF in AM   Brief narrative: [name] is a 81 y.o. female, with a past medical history of 16 bowel obstructions in the past 5 years, and diastolic heart failure, A. fib, ovarian cancer, bladder cancer, and obstructive sleep apnea on C Pap. She presents to the ER with about obstruction. Her daughter who is a former O. R. nurse at [employer], reports that she began vomiting yesterday afternoon. The vomiting has been persistent she is  unable to keep down even liquids. She has not had a bowel movement in several days. Her daughter states that the patient absolutely does not want surgery her previous bowel obstructions have been managed conservatively. She also states that her mother had a bad GI bleed from her previous nasogastric tube, so if possible they would like to avoid an NG tube, she needs it. She is normally on Eliquis and rate control medications for A. fib. Unfortunately in the ER she appears to be in A. fib with RVR. Per her daughter the patient is a DO NOT RESUSCITATE.  CAT scan of the abdomen and pelvis shows small bowel obstruction, she is dehydrated and has mild acute renal failure with an elevation in her creatinine from 0.8 to 1.32. Lactic acid is 2.54.    Consultants:  none  Procedures: none  Antibiotics: none  HPI/Subjective: Says she is not passing gas today   Objective: Filed Vitals:   08/29/14 0530 08/29/14 1000 08/29/14 2117 08/30/14 0513  BP: 140/64 154/87 123/70 125/44  Pulse: 45 60 79 67  Temp: 97.5 F (36.4 C) 98.4 F (36.9 C) 99 F (37.2 C) 98 F (36.7 C)  TempSrc: Oral Oral Oral Oral  Resp: 16 18 20 18   Height:      Weight:   96.662 kg (213 lb 1.6 oz)   SpO2: 88% 90% 98% 90%    Intake/Output Summary (Last 24 hours) at 08/30/14 0947 Last data filed at 08/30/14 0844  Gross per 24 hour  Intake    860 ml  Output    950 ml  Net    -90 ml  Exam:  General: No acute respiratory distress Lungs: mild crackles at bases  Cardiovascular: Regular rate and rhythm without murmur gallop or rub normal S1 and S2 Abdomen: +BS, distended Extremities: bilateral LE edema.       Data Reviewed: Basic Metabolic Panel:  Recent Labs Lab 08/25/14 1023 08/26/14 0530 08/27/14 0523 08/29/14 1210 08/30/14 0435  NA 140 141 144 139 140  K 4.5 4.0 3.6 4.5 3.5  CL 102 108 114* 110 106  CO2 25 23 23 23 25   GLUCOSE 138* 112* 112* 116* 111*  BUN 30* 45* 36* 13 13  CREATININE 1.32*  1.26* 1.03 0.67 0.71  CALCIUM 9.9 8.9 8.0* 8.5 8.8    Liver Function Tests:  Recent Labs Lab 08/25/14 1023 08/26/14 1214 08/27/14 0523  AST 32 25 21  ALT 20 15 16   ALKPHOS 78 61 55  BILITOT 1.2 1.1 1.0  PROT 8.0 6.9 5.7*  ALBUMIN 4.6 3.7 3.4*    Recent Labs Lab 08/25/14 1023  LIPASE 34   No results for input(s): AMMONIA in the last 168 hours.  CBC:  Recent Labs Lab 08/25/14 1023 08/26/14 2010 08/27/14 0523 08/28/14 0542  WBC 8.3 8.0 7.7 9.2  NEUTROABS 7.2  --   --   --   HGB 16.9* 14.7 14.0 15.1*  HCT 50.7* 44.8 44.0 45.8  MCV 90.4 92.8 92.1 91.8  PLT 193 149* 155 151    Cardiac Enzymes:  Recent Labs Lab 08/25/14 0002 08/25/14 1728 08/26/14 0530  TROPONINI 0.03 <0.03 <0.03   BNP (last 3 results) No results for input(s): BNP in the last 8760 hours.  ProBNP (last 3 results) No results for input(s): PROBNP in the last 8760 hours.    CBG:  Recent Labs Lab 08/29/14 1616 08/29/14 2001 08/30/14 0011 08/30/14 0509 08/30/14 0717  GLUCAP 100* 105* 94 127* 118*    No results found for this or any previous visit (from the past 240 hour(s)).   Studies: Ct Abdomen Pelvis Wo Contrast  08/25/2014   CLINICAL DATA:  Sharp abdominal pain and vomiting  EXAM: CT ABDOMEN AND PELVIS WITHOUT CONTRAST  TECHNIQUE: Multidetector CT imaging of the abdomen and pelvis was performed following the standard protocol without IV contrast.  COMPARISON:  11/11/2012  FINDINGS: The lung bases are free of acute infiltrate or sizable effusion. A small pericardial effusion is noted. The cardiac shadow is enlarged.  The liver, gallbladder, spleen, adrenal glands and pancreas are within normal limits. The kidneys are within normal limits without evidence of calculi or obstructive change. 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  Significant distension of the stomach and proximal small bowel is  identified. This continues distally to the right lower quadrant and on image number 46 of series 2 there is an abrupt transition identified with more normal small bowel noted beyond this region. This area lies in an area of previous scar in the anterior abdominal wall and is likely related to adhesions. The more distal small bowel as well as the colon appear within normal limits.  The bladder is partially distended. The uterus and appendix have been surgically removed. Minimal free fluid is noted within the pelvis. Diffuse aortoiliac calcifications are seen without aneurysmal dilatation. Degenerative changes of the lumbar spine are seen. No acute compression deformity is noted.  IMPRESSION: Significant distention of the stomach and small bowel to an area in the right lower quadrant.  The overlying skin is attenuated likely related to prior postsurgical scar. This obstructive changes likely related to postoperative adhesions.  Although not mentioned in the body of the report the majority of the small bowel lies within the right abdomen likely related to partial malrotation. These changes are stable from a prior study.  No other acute abnormality is seen.   Electronically Signed   By: Inez Catalina M.D.   On: 08/25/2014 14:03   Dg Chest 2 View  08/29/2014   CLINICAL DATA:  Shortness of breath  EXAM: CHEST  2 VIEW  COMPARISON:  None.  FINDINGS: Cardiac shadow is enlarged in size. Mild vascular congestion is noted. Mild blunting of the costophrenic angles is noted consistent with small effusions. No focal infiltrate or thorax is seen. No acute bony abnormality is noted. Stable bone island is noted in the left humerus.  IMPRESSION: Mild vascular congestion and small effusions. No other focal abnormality is noted.   Electronically Signed   By: Inez Catalina M.D.   On: 08/29/2014 19:07   Dg Abd 1 View  08/27/2014   CLINICAL DATA:  Abdominal pain and distention. Small bowel obstruction.  EXAM: ABDOMEN - 1 VIEW   COMPARISON:  08/26/2014  FINDINGS: Previously seen nasogastric tube is no longer visualized. Small bowel and colonic gas is seen, but no dilated bowel loops are seen on today's study. Gas and stool seen throughout the entire colon to the level the rectum.  IMPRESSION: No evidence of dilated bowel loops or other acute findings.   Electronically Signed   By: Earle Gell M.D.   On: 08/27/2014 10:18   Dg Abd 1 View  08/26/2014   CLINICAL DATA:  Subsequent evaluation of small bowel obstruction with nausea  EXAM: ABDOMEN - 1 VIEW  COMPARISON:  01/03/2014  FINDINGS: NG tube projects over the stomach. There is gas throughout small and large bowel. There are no particularly dilated loops of bowel. Gas is seen into the rectum. There are a few clinically dilated loops of small bowel in the mid abdomen measuring up to about 3.7 cm.  IMPRESSION: Further improvement in small bowel obstruction.   Electronically Signed   By: Skipper Cliche M.D.   On: 08/26/2014 14:37    Scheduled Meds: . apixaban  5 mg Oral BID  . bisacodyl  5 mg Oral Daily  . diltiazem  360 mg Oral Daily  . furosemide  40 mg Intravenous Daily  . insulin aspart  0-9 Units Subcutaneous 6 times per day  . levothyroxine  224 mcg Oral QAC breakfast  . senna-docusate  1 tablet Oral BID  . sodium chloride  3 mL Intravenous Q12H   Continuous Infusions:    Principal Problem:   Bowel obstruction Active Problems:   SBO (small bowel obstruction)   Nausea & vomiting   Hypothyroidism   Hyperlipidemia   OSA (obstructive sleep apnea)   HX: breast cancer   H/O ovarian cancer   Hx of bladder cancer   Essential hypertension   Small bowel obstruction   Atrial fibrillation with RVR   Acute renal failure   Diastolic heart failure    Time spent: 25 minutes   Tavia Stave  [facility] Pager [phone]. If 7PM-7AM, please contact night-coverage at [email or web address], password [facility] 08/30/2014, 9:47 AM  LOS: 5 days

## 2014-08-30 NOTE — Care Management (Signed)
CARE MANAGEMENT NOTE 08/30/2014  Patient:  [name], [name]   Account Number:  [id number]  Date Initiated:  08/30/2014  Documentation initiated by:  Jasmine Pang  Subjective/Objective Assessment:   CM following for progression and d/c planning     Action/Plan:   Met with pt IM given, plan for d/c to SNF   Anticipated DC Date:  08/31/2014   Anticipated DC Plan:  SKILLED NURSING FACILITY         Choice offered to / List presented to:             Status of service:  Completed, signed off Medicare Important Message given?  YES (If response is "NO", the following Medicare IM given date fields will be blank) Date Medicare IM given:  08/30/2014 Medicare IM given by:  Brandie Lopes Date Additional Medicare IM given:   Additional Medicare IM given by:    Discharge Disposition:  [place]  Per UR Regulation:    If discussed at Long Length of Stay Meetings, dates discussed:    Comments:

## 2014-08-30 NOTE — Progress Notes (Signed)
Patient continues to decline nocturnal CPAP. Eduction provided. RT will continue to follow.

## 2014-08-30 NOTE — Progress Notes (Signed)
Physical Therapy Treatment Patient Details Name: [name] MRN: [id number] DOB: [birth year] Today's Date: 08/30/2014    History of Present Illness Pt is a 81 y.o. female, with a PMH of 16 bowel obstructions in the past 5 years, diastolic heart failure, A. fib, ovarian cancer, bladder cancer, and obstructive sleep apnea on C Pap. She presents to the ER with concern for bowel obstruction. Her daughter who is a former O[employer]. Marine scientist at [employer], reports that she began vomiting yesterday afternoon. The vomiting has been persistent she is unable to keep down even liquids. She has not had a bowel movement in several days. Her daughter states that the patient absolutely does not want surgery her previous bowel obstructions have been managed conservatively. Unfortunately in the ER she appears to be in A. fib with RVR. Per her daughter the patient is a DO NOT RESUSCITATE. CAT scan of the abdomen and pelvis shows small bowel obstruction, she is dehydrated and has mild acute renal failure.    PT Comments    Pt progressing towards physical therapy goals. Was able to improve ambulation distance and was motivated to participate. Overall good rehab effort. Although pt is improving with mobility, continue to recommend SNF at this time as pt will not have 24 hour assist available. PT frequency was increased to 3x/week in an effort to get pt independent and safe enough to d/c home. Will continue to follow.   Follow Up Recommendations  SNF;Supervision/Assistance - 24 hour     Equipment Recommendations  None recommended by PT    Recommendations for Other Services       Precautions / Restrictions Precautions Precautions: Fall Restrictions Weight Bearing Restrictions: No    Mobility  Bed Mobility               General bed mobility comments: Pt up in recliner upon arrival  Transfers Overall transfer level: Needs assistance Equipment used: Rolling walker (2 wheeled) Transfers: Sit to/from Stand Sit  to Stand: Min guard         General transfer comment: Pt was able to power-up to full stand without physical assist. VC's for proper hand placement on seated surface for safety.   Ambulation/Gait Ambulation/Gait assistance: Min guard Ambulation Distance (Feet): 225 Feet Assistive device: Rolling walker (2 wheeled) Gait Pattern/deviations: Step-through pattern;Decreased stride length;Drifts right/left;Trunk flexed Gait velocity: Decreased Gait velocity interpretation: Below normal speed for age/gender General Gait Details: Pt required frequent cueing for walker placement close to pt's body, as well as proper hand placement in the middle of the walker.    Stairs            Wheelchair Mobility    Modified Rankin (Stroke Patients Only)       Balance Overall balance assessment: Needs assistance Sitting-balance support: Feet supported;No upper extremity supported Sitting balance-Leahy Scale: Good     Standing balance support: Bilateral upper extremity supported;During functional activity Standing balance-Leahy Scale: Poor Standing balance comment: Pt requires UE support to maintain standing balance at this time. Was able to comb hair at the sink with +1 UE support.                     Cognition Arousal/Alertness: Awake/alert Behavior During Therapy: WFL for tasks assessed/performed Overall Cognitive Status: Within Functional Limits for tasks assessed                      Exercises      General Comments  Pertinent Vitals/Pain Pain Assessment: No/denies pain    Home Living                      Prior Function            PT Goals (current goals can now be found in the care plan section) Acute Rehab PT Goals Patient Stated Goal: Home PT Goal Formulation: With patient Time For Goal Achievement: 09/09/14 Potential to Achieve Goals: Good Progress towards PT goals: Progressing toward goals    Frequency  Min 3X/week    PT Plan  Frequency needs to be updated    Co-evaluation             End of Session Equipment Utilized During Treatment: Gait belt Activity Tolerance: Patient tolerated treatment well Patient left: in chair;with chair alarm set;with call bell/phone within reach     Time: 1101-1124 PT Time Calculation (min) (ACUTE ONLY): 23 min  Charges:  $Gait Training: 8-22 mins $Therapeutic Activity: 8-22 mins                    G Codes:      Rolinda Roan 2014/09/02, 12:08 PM   Rolinda Roan, PT, DPT Acute Rehabilitation Services Pager: [phone]

## 2014-08-31 DIAGNOSIS — M6281 Muscle weakness (generalized): Secondary | ICD-10-CM | POA: Diagnosis not present

## 2014-08-31 DIAGNOSIS — I5030 Unspecified diastolic (congestive) heart failure: Secondary | ICD-10-CM | POA: Diagnosis not present

## 2014-08-31 DIAGNOSIS — I5033 Acute on chronic diastolic (congestive) heart failure: Secondary | ICD-10-CM | POA: Diagnosis not present

## 2014-08-31 DIAGNOSIS — I10 Essential (primary) hypertension: Secondary | ICD-10-CM | POA: Diagnosis not present

## 2014-08-31 DIAGNOSIS — K5669 Other intestinal obstruction: Secondary | ICD-10-CM | POA: Diagnosis not present

## 2014-08-31 DIAGNOSIS — C801 Malignant (primary) neoplasm, unspecified: Secondary | ICD-10-CM | POA: Diagnosis not present

## 2014-08-31 DIAGNOSIS — I4891 Unspecified atrial fibrillation: Secondary | ICD-10-CM | POA: Diagnosis not present

## 2014-08-31 DIAGNOSIS — K5660 Unspecified intestinal obstruction: Secondary | ICD-10-CM | POA: Diagnosis not present

## 2014-08-31 DIAGNOSIS — E039 Hypothyroidism, unspecified: Secondary | ICD-10-CM | POA: Diagnosis not present

## 2014-08-31 DIAGNOSIS — R2689 Other abnormalities of gait and mobility: Secondary | ICD-10-CM | POA: Diagnosis not present

## 2014-08-31 DIAGNOSIS — N179 Acute kidney failure, unspecified: Secondary | ICD-10-CM | POA: Diagnosis not present

## 2014-08-31 DIAGNOSIS — G4733 Obstructive sleep apnea (adult) (pediatric): Secondary | ICD-10-CM | POA: Diagnosis not present

## 2014-08-31 DIAGNOSIS — R279 Unspecified lack of coordination: Secondary | ICD-10-CM | POA: Diagnosis not present

## 2014-08-31 LAB — CBC
HCT: 43.3 % (ref 36.0–46.0)
HEMOGLOBIN: 14.8 g/dL (ref 12.0–15.0)
MCH: 30.6 pg (ref 26.0–34.0)
MCHC: 34.2 g/dL (ref 30.0–36.0)
MCV: 89.6 fL (ref 78.0–100.0)
Platelets: 137 10*3/uL — ABNORMAL LOW (ref 150–400)
RBC: 4.83 MIL/uL (ref 3.87–5.11)
RDW: 14.7 % (ref 11.5–15.5)
WBC: 5.6 10*3/uL (ref 4.0–10.5)

## 2014-08-31 LAB — GLUCOSE, CAPILLARY
GLUCOSE-CAPILLARY: 100 mg/dL — AB (ref 70–99)
GLUCOSE-CAPILLARY: 113 mg/dL — AB (ref 70–99)
GLUCOSE-CAPILLARY: 144 mg/dL — AB (ref 70–99)
Glucose-Capillary: 88 mg/dL (ref 70–99)

## 2014-08-31 LAB — BASIC METABOLIC PANEL
Anion gap: 14 (ref 5–15)
BUN: 13 mg/dL (ref 6–23)
CHLORIDE: 103 mmol/L (ref 96–112)
CO2: 26 mmol/L (ref 19–32)
CREATININE: 0.65 mg/dL (ref 0.50–1.10)
Calcium: 9.2 mg/dL (ref 8.4–10.5)
GFR calc Af Amer: 87 mL/min — ABNORMAL LOW (ref >90)
GFR calc non Af Amer: 75 mL/min — ABNORMAL LOW (ref >90)
Glucose, Bld: 103 mg/dL — ABNORMAL HIGH (ref 70–99)
Potassium: 3.6 mmol/L (ref 3.5–5.1)
Sodium: 143 mmol/L (ref 135–145)

## 2014-08-31 MED ORDER — BISACODYL 10 MG RE SUPP
10.0000 mg | Freq: Every day | RECTAL | Status: DC | PRN
Start: 2014-08-31 — End: 2015-09-26

## 2014-08-31 MED ORDER — FLEET ENEMA 7-19 GM/118ML RE ENEM: 1.0000 | ENEMA | Freq: Every day | RECTAL | Status: AC | PRN

## 2014-08-31 MED ORDER — SENNOSIDES-DOCUSATE SODIUM 8.6-50 MG PO TABS: 1.0000 | ORAL_TABLET | Freq: Two times a day (BID) | ORAL | Status: AC

## 2014-08-31 MED ORDER — POLYVINYL ALCOHOL 1.4 % OP SOLN
1.0000 [drp] | OPHTHALMIC | Status: DC | PRN
  Filled 2014-08-31: qty 15

## 2014-08-31 MED ORDER — LEVOTHYROXINE SODIUM 125 MCG PO TABS: 238.5000 ug | ORAL_TABLET | Freq: Every day | ORAL | Status: DC

## 2014-08-31 NOTE — Progress Notes (Signed)
OT Cancellation Note  Patient Details Name: [name] MRN: [id number] DOB: June 06, 1920   Cancelled Treatment:    Reason Eval/Treat Not Completed: Other (comment) Pt declined as she is leaving for SNF today.    Benito Mccreedy OTR/L [phone] 08/31/2014, 11:34 AM

## 2014-08-31 NOTE — Discharge Summary (Signed)
Physician Discharge Summary  [name] TKZ:[id number] DOB: 1920/01/21 DOA: 08/25/2014  PCP: [name], MD  Admit date: 08/25/2014 Discharge date: 08/31/2014  Time spent: 35 minutes  Recommendations for Outpatient Follow-up:  1. Daily BMs 2. CPAP at night 3. TSH 6 weeks  Discharge Diagnoses:  Principal Problem:   Bowel obstruction Active Problems:   SBO (small bowel obstruction)   Nausea & vomiting   Hypothyroidism   Hyperlipidemia   OSA (obstructive sleep apnea)   HX: breast cancer   H/O ovarian cancer   Hx of bladder cancer   Essential hypertension   Small bowel obstruction   Atrial fibrillation with RVR   Acute renal failure   Diastolic heart failure   Discharge Condition: improved  Diet recommendation: cardiac  Filed Weights   08/28/14 2152 08/29/14 2117 08/30/14 2036  Weight: 98.567 kg (217 lb 4.8 oz) 96.662 kg (213 lb 1.6 oz) 96.2 kg (212 lb 1.3 oz)    History of present illness:  [name] is a 81 y.o. female, with a past medical history of 16 bowel obstructions in the past 5 years, and diastolic heart failure, A. fib, ovarian cancer, bladder cancer, and obstructive sleep apnea on C Pap. She presents to the ER with about obstruction. Her daughter who is a former O. R. nurse at [employer], reports that she began vomiting yesterday afternoon. The vomiting has been persistent she is unable to keep down even liquids. She has not had a bowel movement in several days. Her daughter states that the patient absolutely does not want surgery her previous bowel obstructions have been managed conservatively. She also states that her mother had a bad GI bleed from her previous nasogastric tube, so if possible they would like to avoid an NG tube, she needs it. She is normally on Eliquis and rate control medications for A. fib. Unfortunately in the ER she appears to be in A. fib with RVR. Per her daughter the patient is a DO NOT RESUSCITATE.  CAT scan of the abdomen and  pelvis shows small bowel obstruction, she is dehydrated and has mild acute renal failure with an elevation in her creatinine from 0.8 to 1.32. Lactic acid is 2.54.    Hospital Course:  Small bowel obstruction -improving due to adhesion and known anatomical abnormality. Conservative management with bowel rest Maintain potassium between 4.5-5. Tolerating soft diet Continue stool softeners and suppositories, restarted by mouth Dulcolax   Acute on chronic Diastolic heart failure Last echo 6/15, LVEF 50-55%. improved  Acute renal failure resolved  A. fib with RVR Restarted by mouth Cardizem DC heparin, restarted Eliquis   Hyperglycemia Sliding scale insulin sensitive with every 4 hours CBGs. Hemoglobin A1c 5.5  Chest pain Likely GI in origin .  Negative Troponins.start Protonix   Hypothyroidism Increase synthroid  Acute resp failure -off O2 after diuresis  Obstructive sleep apnea C Pap daily at bedtime.  Procedures:    Consultations:    Discharge Exam: Filed Vitals:   08/31/14 0500  BP: 146/78  Pulse: 93  Temp: 98.4 F (36.9 C)  Resp: 18    General: A+Ox3, NAD Cardiovascular: rrr Respiratory: clear  Discharge Instructions   Discharge Instructions    Diet - low sodium heart healthy    Complete by:  As directed      Discharge instructions    Complete by:  As directed   Needs daily BMs as prone to SBO     Increase activity slowly    Complete by:  As  directed           Current Discharge Medication List    START taking these medications   Details  bisacodyl (DULCOLAX) 10 MG suppository Place 1 suppository (10 mg total) rectally daily as needed for moderate constipation. Qty: 12 suppository, Refills: 0    senna-docusate (SENOKOT-S) 8.6-50 MG per tablet Take 1 tablet by mouth 2 (two) times daily.    sodium phosphate (FLEET) 7-19 GM/118ML ENEM Place 133 mLs (1 enema total) rectally daily as needed for severe constipation. Refills: 0       CONTINUE these medications which have CHANGED   Details  levothyroxine (SYNTHROID, LEVOTHROID) 125 MCG tablet Take 2 tablets (250 mcg total) by mouth daily before breakfast.      CONTINUE these medications which have NOT CHANGED   Details  apixaban (ELIQUIS) 5 MG TABS tablet Take 1 tablet (5 mg total) by mouth 2 (two) times daily. Qty: 180 tablet, Refills: 3   Associated Diagnoses: Atrial fibrillation, unspecified    bisacodyl (DULCOLAX) 5 MG EC tablet Take 5 mg by mouth daily.     Cyanocobalamin (VITAMIN B-12 IJ) Inject 1 mL as directed every 30 (thirty) days.     diltiazem (TIAZAC) 360 MG 24 hr capsule Take 360 mg by mouth daily.    ferrous sulfate 325 (65 FE) MG tablet Take 325 mg by mouth daily with breakfast.    furosemide (LASIX) 40 MG tablet Take 1 tablet (40 mg total) by mouth daily. Qty: 90 tablet, Refills: 3    potassium chloride SA (K-DUR,KLOR-CON) 20 MEQ tablet Take 20 mEq by mouth daily.      STOP taking these medications     PRESCRIPTION MEDICATION      ramipril (ALTACE) 10 MG capsule        Allergies  Allergen Reactions  . Codeine     unknown  . Morphine And Related     sick  . Statins     sick  . Zetia [Ezetimibe] Other (See Comments)    Side effect to strong       The results of significant diagnostics from this hospitalization (including imaging, microbiology, ancillary and laboratory) are listed below for reference.    Significant Diagnostic Studies: Ct Abdomen Pelvis Wo Contrast  08/25/2014   CLINICAL DATA:  Sharp abdominal pain and vomiting  EXAM: CT ABDOMEN AND PELVIS WITHOUT CONTRAST  TECHNIQUE: Multidetector CT imaging of the abdomen and pelvis was performed following the standard protocol without IV contrast.  COMPARISON:  11/11/2012  FINDINGS: The lung bases are free of acute infiltrate or sizable effusion. A small pericardial effusion is noted. The cardiac shadow is enlarged.  The liver, gallbladder, spleen, adrenal glands and pancreas  are within normal limits. The kidneys are within normal limits without evidence of calculi or obstructive change. 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  Significant distension of the stomach and proximal small bowel is identified. This continues distally to the right lower quadrant and on image number 46 of series 2 there is an abrupt transition identified with more normal small bowel noted beyond this region. This area lies in an area of previous scar in the anterior abdominal wall and is likely related to adhesions. The more distal small bowel as well as the colon appear within normal limits.  The bladder is partially distended. The uterus and appendix have been surgically removed. Minimal free fluid  is noted within the pelvis. Diffuse aortoiliac calcifications are seen without aneurysmal dilatation. Degenerative changes of the lumbar spine are seen. No acute compression deformity is noted.  IMPRESSION: Significant distention of the stomach and small bowel to an area in the right lower quadrant. The overlying skin is attenuated likely related to prior postsurgical scar. This obstructive changes likely related to postoperative adhesions.  Although not mentioned in the body of the report the majority of the small bowel lies within the right abdomen likely related to partial malrotation. These changes are stable from a prior study.  No other acute abnormality is seen.   Electronically Signed   By: Inez Catalina M.D.   On: 08/25/2014 14:03   Dg Chest 2 View  08/29/2014   CLINICAL DATA:  Shortness of breath  EXAM: CHEST  2 VIEW  COMPARISON:  None.  FINDINGS: Cardiac shadow is enlarged in size. Mild vascular congestion is noted. Mild blunting of the costophrenic angles is noted consistent with small effusions. No focal infiltrate or thorax is seen. No acute bony abnormality is noted. Stable bone island is noted in the left  humerus.  IMPRESSION: Mild vascular congestion and small effusions. No other focal abnormality is noted.   Electronically Signed   By: Inez Catalina M.D.   On: 08/29/2014 19:07   Dg Abd 1 View  08/27/2014   CLINICAL DATA:  Abdominal pain and distention. Small bowel obstruction.  EXAM: ABDOMEN - 1 VIEW  COMPARISON:  08/26/2014  FINDINGS: Previously seen nasogastric tube is no longer visualized. Small bowel and colonic gas is seen, but no dilated bowel loops are seen on today's study. Gas and stool seen throughout the entire colon to the level the rectum.  IMPRESSION: No evidence of dilated bowel loops or other acute findings.   Electronically Signed   By: Earle Gell M.D.   On: 08/27/2014 10:18   Dg Abd 1 View  08/26/2014   CLINICAL DATA:  Subsequent evaluation of small bowel obstruction with nausea  EXAM: ABDOMEN - 1 VIEW  COMPARISON:  01/03/2014  FINDINGS: NG tube projects over the stomach. There is gas throughout small and large bowel. There are no particularly dilated loops of bowel. Gas is seen into the rectum. There are a few clinically dilated loops of small bowel in the mid abdomen measuring up to about 3.7 cm.  IMPRESSION: Further improvement in small bowel obstruction.   Electronically Signed   By: Skipper Cliche M.D.   On: 08/26/2014 14:37    Microbiology: No results found for this or any previous visit (from the past 240 hour(s)).   Labs: Basic Metabolic Panel:  Recent Labs Lab 08/26/14 0530 08/27/14 0523 08/29/14 1210 08/30/14 0435 08/31/14 0708  NA 141 144 139 140 143  K 4.0 3.6 4.5 3.5 3.6  CL 108 114* 110 106 103  CO2 23 23 23 25 26   GLUCOSE 112* 112* 116* 111* 103*  BUN 45* 36* 13 13 13   CREATININE 1.26* 1.03 0.67 0.71 0.65  CALCIUM 8.9 8.0* 8.5 8.8 9.2   Liver Function Tests:  Recent Labs Lab 08/25/14 1023 08/26/14 1214 08/27/14 0523  AST 32 25 21  ALT 20 15 16   ALKPHOS 78 61 55  BILITOT 1.2 1.1 1.0  PROT 8.0 6.9 5.7*  ALBUMIN 4.6 3.7 3.4*    Recent  Labs Lab 08/25/14 1023  LIPASE 34   No results for input(s): AMMONIA in the last 168 hours. CBC:  Recent Labs Lab 08/25/14 1023 08/26/14 2010  08/27/14 0523 08/28/14 0542 08/31/14 0708  WBC 8.3 8.0 7.7 9.2 5.6  NEUTROABS 7.2  --   --   --   --   HGB 16.9* 14.7 14.0 15.1* 14.8  HCT 50.7* 44.8 44.0 45.8 43.3  MCV 90.4 92.8 92.1 91.8 89.6  PLT 193 149* 155 151 137*   Cardiac Enzymes:  Recent Labs Lab 08/25/14 0002 08/25/14 1728 08/26/14 0530  TROPONINI 0.03 <0.03 <0.03   BNP: BNP (last 3 results) No results for input(s): BNP in the last 8760 hours.  ProBNP (last 3 results) No results for input(s): PROBNP in the last 8760 hours.  CBG:  Recent Labs Lab 08/30/14 1607 08/30/14 2027 08/30/14 2358 08/31/14 0415 08/31/14 0749  GLUCAP 107* 115* 97 88 100*       Signed:  Jovita Persing  [facility] 08/31/2014, 10:11 AM

## 2014-08-31 NOTE — Progress Notes (Signed)
Myrla Halsted to be D/C'd [facility] per MD order.  Called report to facility RN, Manson.     Medication List    STOP taking these medications        PRESCRIPTION MEDICATION     ramipril 10 MG capsule  Commonly known as:  ALTACE      TAKE these medications        apixaban 5 MG Tabs tablet  Commonly known as:  ELIQUIS  Take 1 tablet (5 mg total) by mouth 2 (two) times daily.     bisacodyl 10 MG suppository  Commonly known as:  DULCOLAX  Place 1 suppository (10 mg total) rectally daily as needed for moderate constipation.     bisacodyl 5 MG EC tablet  Commonly known as:  DULCOLAX  Take 5 mg by mouth daily.     diltiazem 360 MG 24 hr capsule  Commonly known as:  TIAZAC  Take 360 mg by mouth daily.     ferrous sulfate 325 (65 FE) MG tablet  Take 325 mg by mouth daily with breakfast.     furosemide 40 MG tablet  Commonly known as:  LASIX  Take 1 tablet (40 mg total) by mouth daily.     levothyroxine 125 MCG tablet  Commonly known as:  SYNTHROID, LEVOTHROID  Take 2 tablets (250 mcg total) by mouth daily before breakfast.     potassium chloride SA 20 MEQ tablet  Commonly known as:  K-DUR,KLOR-CON  Take 20 mEq by mouth daily.     senna-docusate 8.6-50 MG per tablet  Commonly known as:  Senokot-S  Take 1 tablet by mouth 2 (two) times daily.     sodium phosphate 7-19 GM/118ML Enem  Place 133 mLs (1 enema total) rectally daily as needed for severe constipation.     VITAMIN B-12 IJ  Inject 1 mL as directed every 30 (thirty) days.        Filed Vitals:   08/31/14 0500  BP: 146/78  Pulse: 93  Temp: 98.4 F (36.9 C)  Resp: 18    Skin clean, dry and intact without evidence of skin break down, no evidence of skin tears noted. IV catheter discontinued intact. Site without signs and symptoms of complications. Dressing and pressure applied. Pt denies pain at this time. No complaints noted.  An After Visit Summary was printed and given to the patient. Patient  escorted via stretcher, and D/C to [facility] via [facility]  Natia Fahmy A 08/31/2014 9:45 AM

## 2014-08-31 NOTE — Progress Notes (Signed)
PT Cancellation Note  Patient Details Name: [name] MRN: [id number] DOB: Jan 17, 1920   Cancelled Treatment:    Reason Eval/Treat Not Completed: Patient declined, no reason specified. Pt anticipating d/c to SNF this morning. Pt states that she wants to wait for therapy evaluation at the facility. Will defer further PT treatment to next venue of care. If d/c is delayed, will continue to follow pt.    Rolinda Roan 08/31/2014, 10:35 AM  Rolinda Roan, PT, DPT Acute Rehabilitation Services Pager: [phone]

## 2014-09-02 DIAGNOSIS — I5030 Unspecified diastolic (congestive) heart failure: Secondary | ICD-10-CM | POA: Diagnosis not present

## 2014-09-02 DIAGNOSIS — I4891 Unspecified atrial fibrillation: Secondary | ICD-10-CM | POA: Diagnosis not present

## 2014-09-02 DIAGNOSIS — K5660 Unspecified intestinal obstruction: Secondary | ICD-10-CM | POA: Diagnosis not present

## 2014-09-02 DIAGNOSIS — E039 Hypothyroidism, unspecified: Secondary | ICD-10-CM | POA: Diagnosis not present

## 2014-09-18 DIAGNOSIS — I5033 Acute on chronic diastolic (congestive) heart failure: Secondary | ICD-10-CM | POA: Diagnosis not present

## 2014-09-18 DIAGNOSIS — I480 Paroxysmal atrial fibrillation: Secondary | ICD-10-CM | POA: Diagnosis not present

## 2014-09-18 DIAGNOSIS — D510 Vitamin B12 deficiency anemia due to intrinsic factor deficiency: Secondary | ICD-10-CM | POA: Diagnosis not present

## 2014-09-18 DIAGNOSIS — R278 Other lack of coordination: Secondary | ICD-10-CM | POA: Diagnosis not present

## 2014-09-18 DIAGNOSIS — I10 Essential (primary) hypertension: Secondary | ICD-10-CM | POA: Diagnosis not present

## 2014-09-18 DIAGNOSIS — M6281 Muscle weakness (generalized): Secondary | ICD-10-CM | POA: Diagnosis not present

## 2014-09-18 DIAGNOSIS — Z79899 Other long term (current) drug therapy: Secondary | ICD-10-CM | POA: Diagnosis not present

## 2014-09-18 DIAGNOSIS — Z9181 History of falling: Secondary | ICD-10-CM | POA: Diagnosis not present

## 2014-09-18 DIAGNOSIS — E119 Type 2 diabetes mellitus without complications: Secondary | ICD-10-CM | POA: Diagnosis not present

## 2014-09-20 DIAGNOSIS — I5033 Acute on chronic diastolic (congestive) heart failure: Secondary | ICD-10-CM | POA: Diagnosis not present

## 2014-09-20 DIAGNOSIS — M6281 Muscle weakness (generalized): Secondary | ICD-10-CM | POA: Diagnosis not present

## 2014-09-20 DIAGNOSIS — E119 Type 2 diabetes mellitus without complications: Secondary | ICD-10-CM | POA: Diagnosis not present

## 2014-09-20 DIAGNOSIS — R278 Other lack of coordination: Secondary | ICD-10-CM | POA: Diagnosis not present

## 2014-09-20 DIAGNOSIS — I10 Essential (primary) hypertension: Secondary | ICD-10-CM | POA: Diagnosis not present

## 2014-09-20 DIAGNOSIS — D510 Vitamin B12 deficiency anemia due to intrinsic factor deficiency: Secondary | ICD-10-CM | POA: Diagnosis not present

## 2014-09-22 DIAGNOSIS — I5033 Acute on chronic diastolic (congestive) heart failure: Secondary | ICD-10-CM | POA: Diagnosis not present

## 2014-09-22 DIAGNOSIS — R278 Other lack of coordination: Secondary | ICD-10-CM | POA: Diagnosis not present

## 2014-09-22 DIAGNOSIS — M6281 Muscle weakness (generalized): Secondary | ICD-10-CM | POA: Diagnosis not present

## 2014-09-22 DIAGNOSIS — I10 Essential (primary) hypertension: Secondary | ICD-10-CM | POA: Diagnosis not present

## 2014-09-22 DIAGNOSIS — D510 Vitamin B12 deficiency anemia due to intrinsic factor deficiency: Secondary | ICD-10-CM | POA: Diagnosis not present

## 2014-09-22 DIAGNOSIS — E119 Type 2 diabetes mellitus without complications: Secondary | ICD-10-CM | POA: Diagnosis not present

## 2014-09-24 DIAGNOSIS — M6281 Muscle weakness (generalized): Secondary | ICD-10-CM | POA: Diagnosis not present

## 2014-09-24 DIAGNOSIS — I5033 Acute on chronic diastolic (congestive) heart failure: Secondary | ICD-10-CM | POA: Diagnosis not present

## 2014-09-24 DIAGNOSIS — R278 Other lack of coordination: Secondary | ICD-10-CM | POA: Diagnosis not present

## 2014-09-24 DIAGNOSIS — I10 Essential (primary) hypertension: Secondary | ICD-10-CM | POA: Diagnosis not present

## 2014-09-24 DIAGNOSIS — D510 Vitamin B12 deficiency anemia due to intrinsic factor deficiency: Secondary | ICD-10-CM | POA: Diagnosis not present

## 2014-09-24 DIAGNOSIS — E119 Type 2 diabetes mellitus without complications: Secondary | ICD-10-CM | POA: Diagnosis not present

## 2014-09-27 DIAGNOSIS — M6281 Muscle weakness (generalized): Secondary | ICD-10-CM | POA: Diagnosis not present

## 2014-09-27 DIAGNOSIS — E119 Type 2 diabetes mellitus without complications: Secondary | ICD-10-CM | POA: Diagnosis not present

## 2014-09-27 DIAGNOSIS — I10 Essential (primary) hypertension: Secondary | ICD-10-CM | POA: Diagnosis not present

## 2014-09-27 DIAGNOSIS — I5033 Acute on chronic diastolic (congestive) heart failure: Secondary | ICD-10-CM | POA: Diagnosis not present

## 2014-09-27 DIAGNOSIS — D510 Vitamin B12 deficiency anemia due to intrinsic factor deficiency: Secondary | ICD-10-CM | POA: Diagnosis not present

## 2014-09-27 DIAGNOSIS — R278 Other lack of coordination: Secondary | ICD-10-CM | POA: Diagnosis not present

## 2014-09-28 ENCOUNTER — Ambulatory Visit: Payer: Medicare Other | Admitting: Nurse Practitioner

## 2014-09-28 DIAGNOSIS — I5033 Acute on chronic diastolic (congestive) heart failure: Secondary | ICD-10-CM | POA: Diagnosis not present

## 2014-09-28 DIAGNOSIS — D510 Vitamin B12 deficiency anemia due to intrinsic factor deficiency: Secondary | ICD-10-CM | POA: Diagnosis not present

## 2014-09-28 DIAGNOSIS — M6281 Muscle weakness (generalized): Secondary | ICD-10-CM | POA: Diagnosis not present

## 2014-09-28 DIAGNOSIS — I10 Essential (primary) hypertension: Secondary | ICD-10-CM | POA: Diagnosis not present

## 2014-09-28 DIAGNOSIS — E119 Type 2 diabetes mellitus without complications: Secondary | ICD-10-CM | POA: Diagnosis not present

## 2014-09-28 DIAGNOSIS — R278 Other lack of coordination: Secondary | ICD-10-CM | POA: Diagnosis not present

## 2014-09-29 DIAGNOSIS — I5033 Acute on chronic diastolic (congestive) heart failure: Secondary | ICD-10-CM | POA: Diagnosis not present

## 2014-09-29 DIAGNOSIS — R278 Other lack of coordination: Secondary | ICD-10-CM | POA: Diagnosis not present

## 2014-09-29 DIAGNOSIS — E119 Type 2 diabetes mellitus without complications: Secondary | ICD-10-CM | POA: Diagnosis not present

## 2014-09-29 DIAGNOSIS — I10 Essential (primary) hypertension: Secondary | ICD-10-CM | POA: Diagnosis not present

## 2014-09-29 DIAGNOSIS — M6281 Muscle weakness (generalized): Secondary | ICD-10-CM | POA: Diagnosis not present

## 2014-09-29 DIAGNOSIS — D510 Vitamin B12 deficiency anemia due to intrinsic factor deficiency: Secondary | ICD-10-CM | POA: Diagnosis not present

## 2014-10-01 DIAGNOSIS — R278 Other lack of coordination: Secondary | ICD-10-CM | POA: Diagnosis not present

## 2014-10-01 DIAGNOSIS — I5033 Acute on chronic diastolic (congestive) heart failure: Secondary | ICD-10-CM | POA: Diagnosis not present

## 2014-10-01 DIAGNOSIS — M6281 Muscle weakness (generalized): Secondary | ICD-10-CM | POA: Diagnosis not present

## 2014-10-01 DIAGNOSIS — D510 Vitamin B12 deficiency anemia due to intrinsic factor deficiency: Secondary | ICD-10-CM | POA: Diagnosis not present

## 2014-10-01 DIAGNOSIS — I10 Essential (primary) hypertension: Secondary | ICD-10-CM | POA: Diagnosis not present

## 2014-10-01 DIAGNOSIS — E119 Type 2 diabetes mellitus without complications: Secondary | ICD-10-CM | POA: Diagnosis not present

## 2014-10-04 DIAGNOSIS — E119 Type 2 diabetes mellitus without complications: Secondary | ICD-10-CM | POA: Diagnosis not present

## 2014-10-04 DIAGNOSIS — I5033 Acute on chronic diastolic (congestive) heart failure: Secondary | ICD-10-CM | POA: Diagnosis not present

## 2014-10-04 DIAGNOSIS — I10 Essential (primary) hypertension: Secondary | ICD-10-CM | POA: Diagnosis not present

## 2014-10-04 DIAGNOSIS — R278 Other lack of coordination: Secondary | ICD-10-CM | POA: Diagnosis not present

## 2014-10-04 DIAGNOSIS — M6281 Muscle weakness (generalized): Secondary | ICD-10-CM | POA: Diagnosis not present

## 2014-10-04 DIAGNOSIS — D510 Vitamin B12 deficiency anemia due to intrinsic factor deficiency: Secondary | ICD-10-CM | POA: Diagnosis not present

## 2014-10-05 DIAGNOSIS — E119 Type 2 diabetes mellitus without complications: Secondary | ICD-10-CM | POA: Diagnosis not present

## 2014-10-05 DIAGNOSIS — D510 Vitamin B12 deficiency anemia due to intrinsic factor deficiency: Secondary | ICD-10-CM | POA: Diagnosis not present

## 2014-10-05 DIAGNOSIS — M6281 Muscle weakness (generalized): Secondary | ICD-10-CM | POA: Diagnosis not present

## 2014-10-05 DIAGNOSIS — I10 Essential (primary) hypertension: Secondary | ICD-10-CM | POA: Diagnosis not present

## 2014-10-05 DIAGNOSIS — R278 Other lack of coordination: Secondary | ICD-10-CM | POA: Diagnosis not present

## 2014-10-05 DIAGNOSIS — I5033 Acute on chronic diastolic (congestive) heart failure: Secondary | ICD-10-CM | POA: Diagnosis not present

## 2014-10-06 DIAGNOSIS — I10 Essential (primary) hypertension: Secondary | ICD-10-CM | POA: Diagnosis not present

## 2014-10-06 DIAGNOSIS — R278 Other lack of coordination: Secondary | ICD-10-CM | POA: Diagnosis not present

## 2014-10-06 DIAGNOSIS — E119 Type 2 diabetes mellitus without complications: Secondary | ICD-10-CM | POA: Diagnosis not present

## 2014-10-06 DIAGNOSIS — M6281 Muscle weakness (generalized): Secondary | ICD-10-CM | POA: Diagnosis not present

## 2014-10-06 DIAGNOSIS — I5033 Acute on chronic diastolic (congestive) heart failure: Secondary | ICD-10-CM | POA: Diagnosis not present

## 2014-10-06 DIAGNOSIS — D510 Vitamin B12 deficiency anemia due to intrinsic factor deficiency: Secondary | ICD-10-CM | POA: Diagnosis not present

## 2014-10-07 ENCOUNTER — Telehealth: Payer: Self-pay | Admitting: Internal Medicine

## 2014-10-07 DIAGNOSIS — M6281 Muscle weakness (generalized): Secondary | ICD-10-CM | POA: Diagnosis not present

## 2014-10-07 DIAGNOSIS — D510 Vitamin B12 deficiency anemia due to intrinsic factor deficiency: Secondary | ICD-10-CM | POA: Diagnosis not present

## 2014-10-07 DIAGNOSIS — E119 Type 2 diabetes mellitus without complications: Secondary | ICD-10-CM | POA: Diagnosis not present

## 2014-10-07 DIAGNOSIS — I5033 Acute on chronic diastolic (congestive) heart failure: Secondary | ICD-10-CM | POA: Diagnosis not present

## 2014-10-07 DIAGNOSIS — I10 Essential (primary) hypertension: Secondary | ICD-10-CM | POA: Diagnosis not present

## 2014-10-07 DIAGNOSIS — R278 Other lack of coordination: Secondary | ICD-10-CM | POA: Diagnosis not present

## 2014-10-07 NOTE — Telephone Encounter (Signed)
New message      Daughter want to talk to a nurse only.  This is regarding her mom coming to the coumadin clinic for her eliquis follow up and having labs drawn at the PCP office.  She would not tell me anything else except she wanted to talk to a nurse only.

## 2014-10-07 NOTE — Telephone Encounter (Signed)
Her follow up in 01/11/15 with Truitt Merle, NP and [facility]

## 2014-10-12 DIAGNOSIS — R278 Other lack of coordination: Secondary | ICD-10-CM | POA: Diagnosis not present

## 2014-10-12 DIAGNOSIS — I10 Essential (primary) hypertension: Secondary | ICD-10-CM | POA: Diagnosis not present

## 2014-10-12 DIAGNOSIS — I5033 Acute on chronic diastolic (congestive) heart failure: Secondary | ICD-10-CM | POA: Diagnosis not present

## 2014-10-12 DIAGNOSIS — M6281 Muscle weakness (generalized): Secondary | ICD-10-CM | POA: Diagnosis not present

## 2014-10-12 DIAGNOSIS — D510 Vitamin B12 deficiency anemia due to intrinsic factor deficiency: Secondary | ICD-10-CM | POA: Diagnosis not present

## 2014-10-12 DIAGNOSIS — E119 Type 2 diabetes mellitus without complications: Secondary | ICD-10-CM | POA: Diagnosis not present

## 2014-10-13 DIAGNOSIS — I10 Essential (primary) hypertension: Secondary | ICD-10-CM | POA: Diagnosis not present

## 2014-10-13 DIAGNOSIS — E119 Type 2 diabetes mellitus without complications: Secondary | ICD-10-CM | POA: Diagnosis not present

## 2014-10-13 DIAGNOSIS — D510 Vitamin B12 deficiency anemia due to intrinsic factor deficiency: Secondary | ICD-10-CM | POA: Diagnosis not present

## 2014-10-13 DIAGNOSIS — R278 Other lack of coordination: Secondary | ICD-10-CM | POA: Diagnosis not present

## 2014-10-13 DIAGNOSIS — M6281 Muscle weakness (generalized): Secondary | ICD-10-CM | POA: Diagnosis not present

## 2014-10-13 DIAGNOSIS — I5033 Acute on chronic diastolic (congestive) heart failure: Secondary | ICD-10-CM | POA: Diagnosis not present

## 2014-10-14 DIAGNOSIS — R609 Edema, unspecified: Secondary | ICD-10-CM | POA: Diagnosis not present

## 2014-10-14 DIAGNOSIS — I5020 Unspecified systolic (congestive) heart failure: Secondary | ICD-10-CM | POA: Diagnosis not present

## 2014-10-14 DIAGNOSIS — E039 Hypothyroidism, unspecified: Secondary | ICD-10-CM | POA: Diagnosis not present

## 2014-10-14 DIAGNOSIS — E118 Type 2 diabetes mellitus with unspecified complications: Secondary | ICD-10-CM | POA: Diagnosis not present

## 2014-10-14 DIAGNOSIS — I10 Essential (primary) hypertension: Secondary | ICD-10-CM | POA: Diagnosis not present

## 2014-10-14 DIAGNOSIS — K565 Intestinal adhesions [bands] with obstruction (postprocedural) (postinfection): Secondary | ICD-10-CM | POA: Diagnosis not present

## 2014-10-15 DIAGNOSIS — I10 Essential (primary) hypertension: Secondary | ICD-10-CM | POA: Diagnosis not present

## 2014-10-15 DIAGNOSIS — I5033 Acute on chronic diastolic (congestive) heart failure: Secondary | ICD-10-CM | POA: Diagnosis not present

## 2014-10-15 DIAGNOSIS — D510 Vitamin B12 deficiency anemia due to intrinsic factor deficiency: Secondary | ICD-10-CM | POA: Diagnosis not present

## 2014-10-15 DIAGNOSIS — M6281 Muscle weakness (generalized): Secondary | ICD-10-CM | POA: Diagnosis not present

## 2014-10-15 DIAGNOSIS — R278 Other lack of coordination: Secondary | ICD-10-CM | POA: Diagnosis not present

## 2014-10-15 DIAGNOSIS — E119 Type 2 diabetes mellitus without complications: Secondary | ICD-10-CM | POA: Diagnosis not present

## 2014-10-18 ENCOUNTER — Encounter: Payer: Self-pay | Admitting: Podiatry

## 2014-10-18 ENCOUNTER — Ambulatory Visit (INDEPENDENT_AMBULATORY_CARE_PROVIDER_SITE_OTHER): Payer: Medicare Other | Admitting: Podiatry

## 2014-10-18 DIAGNOSIS — M2040 Other hammer toe(s) (acquired), unspecified foot: Secondary | ICD-10-CM | POA: Diagnosis not present

## 2014-10-18 DIAGNOSIS — E0842 Diabetes mellitus due to underlying condition with diabetic polyneuropathy: Secondary | ICD-10-CM

## 2014-10-18 NOTE — Progress Notes (Signed)
   Subjective:    Patient ID: [name], female    DOB: May 22, 1920, 81 y.o.   MRN: [id number]  HPI PUD  DARCO ONE STRAP VELCRO BLACK  SIZE WOMAN SIZ 8.5 XWIDE WITH 3 PAIR CUSTOM INSERTS This is replacement pair of shoes from on satisfactory fit from shoes from the visit of 08/23/2014 Review of Systems     Objective:   Physical Exam    patient's daughter in treatment room today  The diabetic shoes and custom insoles contour satisfactorily    Assessment & Plan:   Assessment: Satisfactory fit of custom foot foot orthotics 6 Satisfactory fit of diabetic shoes 2 Indications for shoes include Diabetic peripheral neuropathy Hammertoe deformities this bilaterally  Diabetic shoes and custom foot orthotics dispensed with wearing instruction provided

## 2014-10-19 DIAGNOSIS — R278 Other lack of coordination: Secondary | ICD-10-CM | POA: Diagnosis not present

## 2014-10-19 DIAGNOSIS — M6281 Muscle weakness (generalized): Secondary | ICD-10-CM | POA: Diagnosis not present

## 2014-10-19 DIAGNOSIS — D510 Vitamin B12 deficiency anemia due to intrinsic factor deficiency: Secondary | ICD-10-CM | POA: Diagnosis not present

## 2014-10-19 DIAGNOSIS — E119 Type 2 diabetes mellitus without complications: Secondary | ICD-10-CM | POA: Diagnosis not present

## 2014-10-19 DIAGNOSIS — I5033 Acute on chronic diastolic (congestive) heart failure: Secondary | ICD-10-CM | POA: Diagnosis not present

## 2014-10-19 DIAGNOSIS — I10 Essential (primary) hypertension: Secondary | ICD-10-CM | POA: Diagnosis not present

## 2014-10-21 DIAGNOSIS — I10 Essential (primary) hypertension: Secondary | ICD-10-CM | POA: Diagnosis not present

## 2014-10-21 DIAGNOSIS — M6281 Muscle weakness (generalized): Secondary | ICD-10-CM | POA: Diagnosis not present

## 2014-10-21 DIAGNOSIS — D510 Vitamin B12 deficiency anemia due to intrinsic factor deficiency: Secondary | ICD-10-CM | POA: Diagnosis not present

## 2014-10-21 DIAGNOSIS — I5033 Acute on chronic diastolic (congestive) heart failure: Secondary | ICD-10-CM | POA: Diagnosis not present

## 2014-10-21 DIAGNOSIS — E119 Type 2 diabetes mellitus without complications: Secondary | ICD-10-CM | POA: Diagnosis not present

## 2014-10-21 DIAGNOSIS — R278 Other lack of coordination: Secondary | ICD-10-CM | POA: Diagnosis not present

## 2014-10-23 DIAGNOSIS — D510 Vitamin B12 deficiency anemia due to intrinsic factor deficiency: Secondary | ICD-10-CM | POA: Diagnosis not present

## 2014-10-23 DIAGNOSIS — R278 Other lack of coordination: Secondary | ICD-10-CM | POA: Diagnosis not present

## 2014-10-23 DIAGNOSIS — E119 Type 2 diabetes mellitus without complications: Secondary | ICD-10-CM | POA: Diagnosis not present

## 2014-10-23 DIAGNOSIS — I10 Essential (primary) hypertension: Secondary | ICD-10-CM | POA: Diagnosis not present

## 2014-10-23 DIAGNOSIS — M6281 Muscle weakness (generalized): Secondary | ICD-10-CM | POA: Diagnosis not present

## 2014-10-23 DIAGNOSIS — I5033 Acute on chronic diastolic (congestive) heart failure: Secondary | ICD-10-CM | POA: Diagnosis not present

## 2014-10-26 DIAGNOSIS — E119 Type 2 diabetes mellitus without complications: Secondary | ICD-10-CM | POA: Diagnosis not present

## 2014-10-26 DIAGNOSIS — R278 Other lack of coordination: Secondary | ICD-10-CM | POA: Diagnosis not present

## 2014-10-26 DIAGNOSIS — M6281 Muscle weakness (generalized): Secondary | ICD-10-CM | POA: Diagnosis not present

## 2014-10-26 DIAGNOSIS — I5033 Acute on chronic diastolic (congestive) heart failure: Secondary | ICD-10-CM | POA: Diagnosis not present

## 2014-10-26 DIAGNOSIS — I10 Essential (primary) hypertension: Secondary | ICD-10-CM | POA: Diagnosis not present

## 2014-10-26 DIAGNOSIS — D510 Vitamin B12 deficiency anemia due to intrinsic factor deficiency: Secondary | ICD-10-CM | POA: Diagnosis not present

## 2014-10-28 DIAGNOSIS — D510 Vitamin B12 deficiency anemia due to intrinsic factor deficiency: Secondary | ICD-10-CM | POA: Diagnosis not present

## 2014-10-28 DIAGNOSIS — I10 Essential (primary) hypertension: Secondary | ICD-10-CM | POA: Diagnosis not present

## 2014-10-28 DIAGNOSIS — R278 Other lack of coordination: Secondary | ICD-10-CM | POA: Diagnosis not present

## 2014-10-28 DIAGNOSIS — E119 Type 2 diabetes mellitus without complications: Secondary | ICD-10-CM | POA: Diagnosis not present

## 2014-10-28 DIAGNOSIS — I5033 Acute on chronic diastolic (congestive) heart failure: Secondary | ICD-10-CM | POA: Diagnosis not present

## 2014-10-28 DIAGNOSIS — M6281 Muscle weakness (generalized): Secondary | ICD-10-CM | POA: Diagnosis not present

## 2014-10-29 DIAGNOSIS — R278 Other lack of coordination: Secondary | ICD-10-CM | POA: Diagnosis not present

## 2014-10-29 DIAGNOSIS — M6281 Muscle weakness (generalized): Secondary | ICD-10-CM | POA: Diagnosis not present

## 2014-10-29 DIAGNOSIS — I10 Essential (primary) hypertension: Secondary | ICD-10-CM | POA: Diagnosis not present

## 2014-10-29 DIAGNOSIS — I5033 Acute on chronic diastolic (congestive) heart failure: Secondary | ICD-10-CM | POA: Diagnosis not present

## 2014-10-29 DIAGNOSIS — E119 Type 2 diabetes mellitus without complications: Secondary | ICD-10-CM | POA: Diagnosis not present

## 2014-10-29 DIAGNOSIS — D510 Vitamin B12 deficiency anemia due to intrinsic factor deficiency: Secondary | ICD-10-CM | POA: Diagnosis not present

## 2014-11-02 DIAGNOSIS — D510 Vitamin B12 deficiency anemia due to intrinsic factor deficiency: Secondary | ICD-10-CM | POA: Diagnosis not present

## 2014-11-02 DIAGNOSIS — R278 Other lack of coordination: Secondary | ICD-10-CM | POA: Diagnosis not present

## 2014-11-02 DIAGNOSIS — E119 Type 2 diabetes mellitus without complications: Secondary | ICD-10-CM | POA: Diagnosis not present

## 2014-11-02 DIAGNOSIS — I5033 Acute on chronic diastolic (congestive) heart failure: Secondary | ICD-10-CM | POA: Diagnosis not present

## 2014-11-02 DIAGNOSIS — I10 Essential (primary) hypertension: Secondary | ICD-10-CM | POA: Diagnosis not present

## 2014-11-02 DIAGNOSIS — M6281 Muscle weakness (generalized): Secondary | ICD-10-CM | POA: Diagnosis not present

## 2014-11-03 DIAGNOSIS — M6281 Muscle weakness (generalized): Secondary | ICD-10-CM | POA: Diagnosis not present

## 2014-11-03 DIAGNOSIS — E119 Type 2 diabetes mellitus without complications: Secondary | ICD-10-CM | POA: Diagnosis not present

## 2014-11-03 DIAGNOSIS — I5033 Acute on chronic diastolic (congestive) heart failure: Secondary | ICD-10-CM | POA: Diagnosis not present

## 2014-11-03 DIAGNOSIS — D510 Vitamin B12 deficiency anemia due to intrinsic factor deficiency: Secondary | ICD-10-CM | POA: Diagnosis not present

## 2014-11-03 DIAGNOSIS — I10 Essential (primary) hypertension: Secondary | ICD-10-CM | POA: Diagnosis not present

## 2014-11-03 DIAGNOSIS — R278 Other lack of coordination: Secondary | ICD-10-CM | POA: Diagnosis not present

## 2014-11-04 DIAGNOSIS — D510 Vitamin B12 deficiency anemia due to intrinsic factor deficiency: Secondary | ICD-10-CM | POA: Diagnosis not present

## 2014-11-04 DIAGNOSIS — I5033 Acute on chronic diastolic (congestive) heart failure: Secondary | ICD-10-CM | POA: Diagnosis not present

## 2014-11-04 DIAGNOSIS — I10 Essential (primary) hypertension: Secondary | ICD-10-CM | POA: Diagnosis not present

## 2014-11-04 DIAGNOSIS — M6281 Muscle weakness (generalized): Secondary | ICD-10-CM | POA: Diagnosis not present

## 2014-11-04 DIAGNOSIS — R278 Other lack of coordination: Secondary | ICD-10-CM | POA: Diagnosis not present

## 2014-11-04 DIAGNOSIS — E119 Type 2 diabetes mellitus without complications: Secondary | ICD-10-CM | POA: Diagnosis not present

## 2014-11-05 DIAGNOSIS — M6281 Muscle weakness (generalized): Secondary | ICD-10-CM | POA: Diagnosis not present

## 2014-11-05 DIAGNOSIS — I5033 Acute on chronic diastolic (congestive) heart failure: Secondary | ICD-10-CM | POA: Diagnosis not present

## 2014-11-05 DIAGNOSIS — R278 Other lack of coordination: Secondary | ICD-10-CM | POA: Diagnosis not present

## 2014-11-05 DIAGNOSIS — D510 Vitamin B12 deficiency anemia due to intrinsic factor deficiency: Secondary | ICD-10-CM | POA: Diagnosis not present

## 2014-11-05 DIAGNOSIS — I10 Essential (primary) hypertension: Secondary | ICD-10-CM | POA: Diagnosis not present

## 2014-11-05 DIAGNOSIS — E119 Type 2 diabetes mellitus without complications: Secondary | ICD-10-CM | POA: Diagnosis not present

## 2014-11-09 DIAGNOSIS — M6281 Muscle weakness (generalized): Secondary | ICD-10-CM | POA: Diagnosis not present

## 2014-11-09 DIAGNOSIS — E119 Type 2 diabetes mellitus without complications: Secondary | ICD-10-CM | POA: Diagnosis not present

## 2014-11-09 DIAGNOSIS — I5033 Acute on chronic diastolic (congestive) heart failure: Secondary | ICD-10-CM | POA: Diagnosis not present

## 2014-11-09 DIAGNOSIS — R278 Other lack of coordination: Secondary | ICD-10-CM | POA: Diagnosis not present

## 2014-11-09 DIAGNOSIS — I10 Essential (primary) hypertension: Secondary | ICD-10-CM | POA: Diagnosis not present

## 2014-11-09 DIAGNOSIS — D510 Vitamin B12 deficiency anemia due to intrinsic factor deficiency: Secondary | ICD-10-CM | POA: Diagnosis not present

## 2014-11-11 DIAGNOSIS — M6281 Muscle weakness (generalized): Secondary | ICD-10-CM | POA: Diagnosis not present

## 2014-11-11 DIAGNOSIS — R278 Other lack of coordination: Secondary | ICD-10-CM | POA: Diagnosis not present

## 2014-11-11 DIAGNOSIS — E118 Type 2 diabetes mellitus with unspecified complications: Secondary | ICD-10-CM | POA: Diagnosis not present

## 2014-11-11 DIAGNOSIS — D510 Vitamin B12 deficiency anemia due to intrinsic factor deficiency: Secondary | ICD-10-CM | POA: Diagnosis not present

## 2014-11-11 DIAGNOSIS — I5033 Acute on chronic diastolic (congestive) heart failure: Secondary | ICD-10-CM | POA: Diagnosis not present

## 2014-11-11 DIAGNOSIS — E119 Type 2 diabetes mellitus without complications: Secondary | ICD-10-CM | POA: Diagnosis not present

## 2014-11-11 DIAGNOSIS — I10 Essential (primary) hypertension: Secondary | ICD-10-CM | POA: Diagnosis not present

## 2014-11-12 DIAGNOSIS — D510 Vitamin B12 deficiency anemia due to intrinsic factor deficiency: Secondary | ICD-10-CM | POA: Diagnosis not present

## 2014-11-12 DIAGNOSIS — M6281 Muscle weakness (generalized): Secondary | ICD-10-CM | POA: Diagnosis not present

## 2014-11-12 DIAGNOSIS — I10 Essential (primary) hypertension: Secondary | ICD-10-CM | POA: Diagnosis not present

## 2014-11-12 DIAGNOSIS — R278 Other lack of coordination: Secondary | ICD-10-CM | POA: Diagnosis not present

## 2014-11-12 DIAGNOSIS — E119 Type 2 diabetes mellitus without complications: Secondary | ICD-10-CM | POA: Diagnosis not present

## 2014-11-12 DIAGNOSIS — I5033 Acute on chronic diastolic (congestive) heart failure: Secondary | ICD-10-CM | POA: Diagnosis not present

## 2014-11-13 DIAGNOSIS — I10 Essential (primary) hypertension: Secondary | ICD-10-CM | POA: Diagnosis not present

## 2014-11-13 DIAGNOSIS — R278 Other lack of coordination: Secondary | ICD-10-CM | POA: Diagnosis not present

## 2014-11-13 DIAGNOSIS — E119 Type 2 diabetes mellitus without complications: Secondary | ICD-10-CM | POA: Diagnosis not present

## 2014-11-13 DIAGNOSIS — M6281 Muscle weakness (generalized): Secondary | ICD-10-CM | POA: Diagnosis not present

## 2014-11-13 DIAGNOSIS — I5033 Acute on chronic diastolic (congestive) heart failure: Secondary | ICD-10-CM | POA: Diagnosis not present

## 2014-11-13 DIAGNOSIS — D510 Vitamin B12 deficiency anemia due to intrinsic factor deficiency: Secondary | ICD-10-CM | POA: Diagnosis not present

## 2014-11-16 DIAGNOSIS — I5033 Acute on chronic diastolic (congestive) heart failure: Secondary | ICD-10-CM | POA: Diagnosis not present

## 2014-11-16 DIAGNOSIS — M6281 Muscle weakness (generalized): Secondary | ICD-10-CM | POA: Diagnosis not present

## 2014-11-16 DIAGNOSIS — D510 Vitamin B12 deficiency anemia due to intrinsic factor deficiency: Secondary | ICD-10-CM | POA: Diagnosis not present

## 2014-11-16 DIAGNOSIS — E119 Type 2 diabetes mellitus without complications: Secondary | ICD-10-CM | POA: Diagnosis not present

## 2014-11-16 DIAGNOSIS — R278 Other lack of coordination: Secondary | ICD-10-CM | POA: Diagnosis not present

## 2014-11-16 DIAGNOSIS — I10 Essential (primary) hypertension: Secondary | ICD-10-CM | POA: Diagnosis not present

## 2014-11-17 ENCOUNTER — Encounter: Payer: Self-pay | Admitting: *Deleted

## 2014-11-17 DIAGNOSIS — Z87891 Personal history of nicotine dependence: Secondary | ICD-10-CM | POA: Diagnosis not present

## 2014-11-17 DIAGNOSIS — I480 Paroxysmal atrial fibrillation: Secondary | ICD-10-CM | POA: Diagnosis not present

## 2014-11-17 DIAGNOSIS — I10 Essential (primary) hypertension: Secondary | ICD-10-CM | POA: Diagnosis not present

## 2014-11-17 DIAGNOSIS — Z7901 Long term (current) use of anticoagulants: Secondary | ICD-10-CM | POA: Diagnosis not present

## 2014-11-17 DIAGNOSIS — R2681 Unsteadiness on feet: Secondary | ICD-10-CM | POA: Diagnosis not present

## 2014-11-17 DIAGNOSIS — E119 Type 2 diabetes mellitus without complications: Secondary | ICD-10-CM | POA: Diagnosis not present

## 2014-11-17 DIAGNOSIS — Z6841 Body Mass Index (BMI) 40.0 and over, adult: Secondary | ICD-10-CM | POA: Diagnosis not present

## 2014-11-17 DIAGNOSIS — I5033 Acute on chronic diastolic (congestive) heart failure: Secondary | ICD-10-CM | POA: Diagnosis not present

## 2014-11-17 DIAGNOSIS — Z79899 Other long term (current) drug therapy: Secondary | ICD-10-CM | POA: Diagnosis not present

## 2014-11-17 DIAGNOSIS — R2689 Other abnormalities of gait and mobility: Secondary | ICD-10-CM | POA: Diagnosis not present

## 2014-11-17 DIAGNOSIS — D510 Vitamin B12 deficiency anemia due to intrinsic factor deficiency: Secondary | ICD-10-CM | POA: Diagnosis not present

## 2014-11-17 DIAGNOSIS — Z9181 History of falling: Secondary | ICD-10-CM | POA: Diagnosis not present

## 2014-11-17 DIAGNOSIS — E669 Obesity, unspecified: Secondary | ICD-10-CM | POA: Diagnosis not present

## 2014-11-18 DIAGNOSIS — E669 Obesity, unspecified: Secondary | ICD-10-CM | POA: Diagnosis not present

## 2014-11-18 DIAGNOSIS — I5033 Acute on chronic diastolic (congestive) heart failure: Secondary | ICD-10-CM | POA: Diagnosis not present

## 2014-11-18 DIAGNOSIS — R2689 Other abnormalities of gait and mobility: Secondary | ICD-10-CM | POA: Diagnosis not present

## 2014-11-18 DIAGNOSIS — E119 Type 2 diabetes mellitus without complications: Secondary | ICD-10-CM | POA: Diagnosis not present

## 2014-11-18 DIAGNOSIS — Z6841 Body Mass Index (BMI) 40.0 and over, adult: Secondary | ICD-10-CM | POA: Diagnosis not present

## 2014-11-18 DIAGNOSIS — D510 Vitamin B12 deficiency anemia due to intrinsic factor deficiency: Secondary | ICD-10-CM | POA: Diagnosis not present

## 2014-12-06 ENCOUNTER — Ambulatory Visit: Payer: Medicare Other | Admitting: Podiatry

## 2014-12-08 ENCOUNTER — Ambulatory Visit (INDEPENDENT_AMBULATORY_CARE_PROVIDER_SITE_OTHER): Payer: Medicare Other | Admitting: Podiatry

## 2014-12-08 DIAGNOSIS — B351 Tinea unguium: Secondary | ICD-10-CM | POA: Diagnosis not present

## 2014-12-08 DIAGNOSIS — E0842 Diabetes mellitus due to underlying condition with diabetic polyneuropathy: Secondary | ICD-10-CM | POA: Diagnosis not present

## 2014-12-08 NOTE — Progress Notes (Signed)
Patient ID: [name], female   DOB: Nov 12, 1919, 81 y.o.   MRN: [id number]  Subjective: This patient presents today requesting nail debridement. Patient is living in assisted living and is at high risk for falling is not able to transfer from wheelchair to treatment chair  Objective: The toenails are elongated, brittle, discolored 6-10  Assessment: Mycotic toenails Diabetic with peripheral neuropathy  Plan: Debridement toenails 3 without any bleeding  Reappoint 3 months

## 2014-12-08 NOTE — Patient Instructions (Signed)
Diabetes and Foot Care Diabetes may cause you to have problems because of poor blood supply (circulation) to your feet and legs. This may cause the skin on your feet to become thinner, break easier, and heal more slowly. Your skin may become dry, and the skin may peel and crack. You may also have nerve damage in your legs and feet causing decreased feeling in them. You may not notice minor injuries to your feet that could lead to infections or more serious problems. Taking care of your feet is one of the most important things you can do for yourself.  HOME CARE INSTRUCTIONS  Wear shoes at all times, even in the house. Do not go barefoot. Bare feet are easily injured.  Check your feet daily for blisters, cuts, and redness. If you cannot see the bottom of your feet, use a mirror or ask someone for help.  Wash your feet with warm water (do not use hot water) and mild soap. Then pat your feet and the areas between your toes until they are completely dry. Do not soak your feet as this can dry your skin.  Apply a moisturizing lotion or petroleum jelly (that does not contain alcohol and is unscented) to the skin on your feet and to dry, brittle toenails. Do not apply lotion between your toes.  Trim your toenails straight across. Do not dig under them or around the cuticle. File the edges of your nails with an emery board or nail file.  Do not cut corns or calluses or try to remove them with medicine.  Wear clean socks or stockings every day. Make sure they are not too tight. Do not wear knee-high stockings since they may decrease blood flow to your legs.  Wear shoes that fit properly and have enough cushioning. To break in new shoes, wear them for just a few hours a day. This prevents you from injuring your feet. Always look in your shoes before you put them on to be sure there are no objects inside.  Do not cross your legs. This may decrease the blood flow to your feet.  If you find a minor scrape,  cut, or break in the skin on your feet, keep it and the skin around it clean and dry. These areas may be cleansed with mild soap and water. Do not cleanse the area with peroxide, alcohol, or iodine.  When you remove an adhesive bandage, be sure not to damage the skin around it.  If you have a wound, look at it several times a day to make sure it is healing.  Do not use heating pads or hot water bottles. They may burn your skin. If you have lost feeling in your feet or legs, you may not know it is happening until it is too late.  Make sure your health care provider performs a complete foot exam at least annually or more often if you have foot problems. Report any cuts, sores, or bruises to your health care provider immediately. SEEK MEDICAL CARE IF:   You have an injury that is not healing.  You have cuts or breaks in the skin.  You have an ingrown nail.  You notice redness on your legs or feet.  You feel burning or tingling in your legs or feet.  You have pain or cramps in your legs and feet.  Your legs or feet are numb.  Your feet always feel cold. SEEK IMMEDIATE MEDICAL CARE IF:   There is increasing redness,   swelling, or pain in or around a wound.  There is a red line that goes up your leg.  Pus is coming from a wound.  You develop a fever or as directed by your health care provider.  You notice a bad smell coming from an ulcer or wound. Document Released: 06/15/2000 Document Revised: 02/18/2013 Document Reviewed: 11/25/2012 Exit[employer] Patient Information 2015 [employer]. This information is not intended to replace advice given to you by your health care provider. Make sure you discuss any questions you have with your health care provider.  

## 2014-12-15 ENCOUNTER — Ambulatory Visit (INDEPENDENT_AMBULATORY_CARE_PROVIDER_SITE_OTHER): Payer: Medicare Other | Admitting: Nurse Practitioner

## 2014-12-15 ENCOUNTER — Ambulatory Visit (INDEPENDENT_AMBULATORY_CARE_PROVIDER_SITE_OTHER): Payer: Medicare Other | Admitting: *Deleted

## 2014-12-15 ENCOUNTER — Encounter: Payer: Self-pay | Admitting: Nurse Practitioner

## 2014-12-15 VITALS — BP 120/72 | HR 68 | Ht 61.0 in | Wt 209.1 lb

## 2014-12-15 DIAGNOSIS — I482 Chronic atrial fibrillation, unspecified: Secondary | ICD-10-CM

## 2014-12-15 DIAGNOSIS — R2689 Other abnormalities of gait and mobility: Secondary | ICD-10-CM | POA: Diagnosis not present

## 2014-12-15 DIAGNOSIS — I10 Essential (primary) hypertension: Secondary | ICD-10-CM

## 2014-12-15 DIAGNOSIS — E669 Obesity, unspecified: Secondary | ICD-10-CM | POA: Diagnosis not present

## 2014-12-15 DIAGNOSIS — I5032 Chronic diastolic (congestive) heart failure: Secondary | ICD-10-CM

## 2014-12-15 DIAGNOSIS — I5033 Acute on chronic diastolic (congestive) heart failure: Secondary | ICD-10-CM | POA: Diagnosis not present

## 2014-12-15 DIAGNOSIS — I4891 Unspecified atrial fibrillation: Secondary | ICD-10-CM

## 2014-12-15 DIAGNOSIS — D510 Vitamin B12 deficiency anemia due to intrinsic factor deficiency: Secondary | ICD-10-CM | POA: Diagnosis not present

## 2014-12-15 DIAGNOSIS — Z6841 Body Mass Index (BMI) 40.0 and over, adult: Secondary | ICD-10-CM | POA: Diagnosis not present

## 2014-12-15 DIAGNOSIS — Z7901 Long term (current) use of anticoagulants: Secondary | ICD-10-CM | POA: Diagnosis not present

## 2014-12-15 DIAGNOSIS — E119 Type 2 diabetes mellitus without complications: Secondary | ICD-10-CM | POA: Diagnosis not present

## 2014-12-15 NOTE — Progress Notes (Signed)
Pt was started on Eliquis 5mg  twice a day for AFIB on 03/15/13 by Dr. Rayann Heman.    Reviewed patients medication list.  Pt is not currently on any combined P-gp and strong CYP3A4 inhibitors/inducers (ketoconazole, traconazole, ritonavir, carbamazepine, phenytoin, rifampin, St. John's wort).  Reviewed labs:  SCr 0.71, Hgb 14.3, HCT 40.8, Weight 95.1kg (209.12 lbs).    Dose appropriate based on dosing criteria.    A full discussion of the nature of anticoagulants has been carried out.  A benefit/risk analysis has been presented to the patient, so that they understand the justification for choosing anticoagulation with Eliquis at this time.  The need for compliance is stressed.  Pt is aware to take the medication twice daily.  Side effects of potential bleeding are discussed, including unusual colored urine or stools, coughing up blood or coffee ground emesis, nose bleeds or serious fall or head trauma.  Discussed signs and symptoms of stroke. The patient should avoid any OTC items containing aspirin or ibuprofen.  Avoid alcohol consumption.  Call if any signs of abnormal bleeding.  Discussed financial obligations and resolved any difficulty in obtaining medication.      Called and spoke with Casandra Doffing, daughter, and advised of the lab results and that we would continue the same dose of Eliquis 5mg  twice a day.  Also, called [facility] and advised them of the lab results and to continue taking Eliquis 5mg  twice a day.

## 2014-12-15 NOTE — Progress Notes (Signed)
CARDIOLOGY OFFICE NOTE  Date:  12/15/2014    [name] Date of Birth: Oct 31, 1919 Medical Record #[id number]  PCP:  [name], MD  Cardiologist:  Allred    Chief Complaint  Patient presents with  . Atrial Fibrillation    Follow up visit - seen for Dr. Rayann Heman    History of Present Illness: [name] is a 81 y.o. female who presents today for a follow up visit. Seen for Dr. Rayann Heman. She has HTN, past SBO, permanent atrial fib - on Eliquis, chronic diastolic HF, venous insuficiency, HLD, OSA, type 2 DM and numerous cancers.  Seen here back in January by Dr. Rayann Heman - felt to be doing ok.   Comes in today. Here with her daughter Gerald Stabs. She is now in assisted living - more controlled. Apparently was having lots of issues living with the other daughter. No falls but "many near misses". Has been to the Coumadin clinic about her Eliquis. She had her lab done by PCP in April - this was all ok. She denies chest pain. Says her breathing is good. Has chronic edema - uses extra lasix prn and has done so for years and wants to continue this practice.   Past Medical History  Diagnosis Date  . Hypertension   . SBO (small bowel obstruction)   . Permanent atrial fibrillation     a. coumadin d/c'd => Pradaxa in 05/2012  . Chronic diastolic heart failure     a. Echo 5/12: Mild LVH, EF 55-60%, mild AI, mild MR, severe LAE, mild RAE, PASP 31, small pericardial effusion  . Venous insufficiency     chronic LE edema  . HLD (hyperlipidemia)   . OSA (obstructive sleep apnea)   . Hypothyroidism   . Diastolic CHF, chronic 01/24/3663    Class 2b-3 2 d echo 5/12 mild LVH EF 55-50%, MILD AI, MILD MR, SEVERE LAE, mild RAE, PASP 31, SMALL PERICRADIAL EFFUSION  . Hx of cervical cancer 11/11/2012  . S/P hysterectomy 11/11/2012  . Colon cancer     "polyp" (12/08/2012)  . Breast cancer   . Cervical cancer   . Ovarian cancer   . Bladder cancer   . H/O ovarian cancer 11/11/2012   MUCINOUS CYST S/P RESECTION 35 YEARS AGO  . Hx of bladder cancer 11/11/2012  . SBO (small bowel obstruction) 08/25/2014  . DM2 (diabetes mellitus, type 2)     TYPE 2    Past Surgical History  Procedure Laterality Date  . Bladder surgery    . Appendectomy    . Vaginal hysterectomy    . Exploratory laparotomy with abdominal mass excision      "21# ovarian tumor; benign" (12/08/2012)  . Cataract extraction w/ intraocular lens  implant, bilateral    . Mastectomy, radical Left   . Breast lumpectomy Right   . Breast biopsy Bilateral   . I&d extremity Right 12/31/2012    Procedure: IRRIGATION AND DEBRIDEMENT RIGHT KNEE ULCER WITH PLACEMENT OF A CELL AND VAC ;  Surgeon: Theodoro Kos, DO;  Location: [facility];  Service: Plastics;  Laterality: Right;     Medications: Current Outpatient Prescriptions  Medication Sig Dispense Refill  . apixaban (ELIQUIS) 5 MG TABS tablet Take 1 tablet (5 mg total) by mouth 2 (two) times daily. 180 tablet 3  . bisacodyl (DULCOLAX) 10 MG suppository Place 1 suppository (10 mg total) rectally daily as needed for moderate constipation. 12 suppository 0  . bisacodyl (DULCOLAX) 5 MG EC  tablet Take 5 mg by mouth daily.     . Cyanocobalamin (VITAMIN B-12 IJ) Inject 1 mL as directed every 30 (thirty) days.     Marland Kitchen diltiazem (TIAZAC) 360 MG 24 hr capsule Take 360 mg by mouth daily.    . ferrous sulfate 325 (65 FE) MG tablet Take 325 mg by mouth daily with breakfast.    . furosemide (LASIX) 80 MG tablet TK 1 T PO QD IN THE MORNING  0  . levothyroxine (SYNTHROID, LEVOTHROID) 125 MCG tablet Take 2 tablets (250 mcg total) by mouth daily before breakfast.    . potassium chloride SA (K-DUR,KLOR-CON) 20 MEQ tablet Take 20 mEq by mouth daily.    . ramipril (ALTACE) 2.5 MG capsule Take 2.5 mg by mouth daily.    Marland Kitchen senna-docusate (SENOKOT-S) 8.6-50 MG per tablet Take 1 tablet by mouth 2 (two) times daily.    . sodium phosphate (FLEET) 7-19 GM/118ML ENEM Place 133 mLs (1 enema total)  rectally daily as needed for severe constipation.  0   No current facility-administered medications for this visit.    Allergies: Allergies  Allergen Reactions  . Codeine     unknown  . Morphine And Related     sick  . Statins     sick  . Zetia [Ezetimibe] Other (See Comments)    Side effect to strong     Social History: The patient  reports that she quit smoking about 48 years ago. Her smoking use included Cigarettes. She has a 75 pack-year smoking history. She has never used smokeless tobacco. She reports that she does not drink alcohol or use illicit drugs.   Family History: The patient's family history is not on file.   Review of Systems: Please see the history of present illness.   Otherwise, the review of systems is positive for none.   All other systems are reviewed and negative.   Physical Exam: VS:  BP 120/72 mmHg  Pulse 68  Ht 5\' 1"  (1.549 m)  Wt 209 lb 1.9 oz (94.856 kg)  BMI 39.53 kg/m2 .  BMI Body mass index is 39.53 kg/(m^2).  Wt Readings from Last 3 Encounters:  12/15/14 209 lb 1.9 oz (94.856 kg)  08/30/14 212 lb 1.3 oz (96.2 kg)  07/09/14 214 lb 12.8 oz (97.433 kg)    General: Pleasant. Elderly female who looks younger than her stated age but in no acute distress.  HEENT: Normal. Neck: Supple, no JVD, carotid bruits, or masses noted.  Cardiac: Irregular irregular rhythm. Rate is ok. Harsh outflow murmur. Her legs are quite full with edema - chronic.  Respiratory:  Lungs are clear to auscultation bilaterally with normal work of breathing.  GI: Soft and nontender.  MS: No deformity or atrophy. Gait not tested. She is in a wheelchair.  Skin: Warm and dry. Color is normal.  Neuro:  Strength and sensation are intact and no gross focal deficits noted.  Psych: Alert, appropriate and with normal affect.   LABORATORY DATA:  EKG:  EKG is not ordered today.  Lab Results  Component Value Date   WBC 5.6 08/31/2014   HGB 14.8 08/31/2014   HCT 43.3  08/31/2014   PLT 137* 08/31/2014   GLUCOSE 103* 08/31/2014   CHOL 173 11/04/2010   TRIG 100 11/04/2010   HDL 45 11/04/2010   LDLCALC * 11/04/2010    108        Total Cholesterol/HDL:CHD Risk Coronary Heart Disease Risk Table  Men   Women  1/2 Average Risk   3.4   3.3  Average Risk       5.0   4.4  2 X Average Risk   9.6   7.1  3 X Average Risk  23.4   11.0        Use the calculated Patient Ratio above and the CHD Risk Table to determine the patient's CHD Risk.        ATP III CLASSIFICATION (LDL):  <100     mg/dL   Optimal  100-129  mg/dL   Near or Above                    Optimal  130-159  mg/dL   Borderline  160-189  mg/dL   High  >190     mg/dL   Very High   ALT 16 08/27/2014   AST 21 08/27/2014   NA 143 08/31/2014   K 3.6 08/31/2014   CL 103 08/31/2014   CREATININE 0.65 08/31/2014   BUN 13 08/31/2014   CO2 26 08/31/2014   TSH 5.548* 08/25/2014   INR 1.30 12/29/2013   HGBA1C 5.5 08/25/2014    BNP (last 3 results) No results for input(s): BNP in the last 8760 hours.  ProBNP (last 3 results) No results for input(s): PROBNP in the last 8760 hours.   Other Studies Reviewed Today:   Echo Study Conclusions from 11/2013  - Left ventricle: The cavity size was normal. Systolic function was normal. The estimated ejection fraction was in the range of 50% to 55%. Wall motion was normal; there were no regional wall motion abnormalities. The study was not technically sufficient to allow evaluation of LV diastolic dysfunction due to atrial fibrillation. - Aortic valve: Trileaflet; severely thickened, severely calcified leaflets. Cusp separation was moderately reduced. There was no regurgitation. Valve area (VTI): 1.03 cm^2. Valve area (Vmax): 0.99 cm^2. - Aortic root: The aortic root was normal in size. - Mitral valve: Mildly thickened leaflets . - Left atrium: The atrium was severely dilated. - Right ventricle: The cavity size was  mildly dilated. Wall thickness was normal. Systolic function was mildly reduced. - Right atrium: The atrium was moderately dilated. - Tricuspid valve: There was mild-moderate regurgitation. - Pulmonic valve: Structurally normal valve. - Pulmonary arteries: Systolic pressure was moderately increased. PA peak pressure: 46 mm Hg (S). - Pericardium, extracardiac: A trivial pericardial effusion was identified.  Impressions:  - Normal left ventricular size and function. Unable to evaluate diastolic function and filling pressures due to atrial fibrillation. Biatrial enlargement. Thickened and calcified aortic valve with moderate stenosis. Moderate pulmonary hypertension with mildly decreased RV systolic function.  Assessment/Plan: 1. Permanent afib - managed on Eliquis and with rate control. She is doing well. She has had labs by PCP just 2 months ago.   2. Chronic diastolic dysfunction - weight is actually down a little. She is now in a more controlled environment. Ok to use extra lasix prn (1/2 extra tablet)  3. HTN - BP ok on current regimen.   Current medicines are reviewed with the patient today.  The patient does not have concerns regarding medicines other than what has been noted above.  The following changes have been made:  See above.  Labs/ tests ordered today include:   No orders of the defined types were placed in this encounter.     Disposition:   FU with Dr. Rayann Heman in 6 months. I will see back as needed.  Patient is agreeable to this plan and will call if any problems develop in the interim.   Signed: Burtis Junes, RN, ANP-C 12/15/2014 2:30 PM  [facility] [address] [place], [place]  [address] Phone: [phone] Fax: [phone]

## 2014-12-15 NOTE — Patient Instructions (Addendum)
We will be checking the following labs today - NONE   Medication Instructions:    Continue with your current medicines but it is ok to take an extra 1/2 of Lasix (40mg ) with her usual dose as needed for swelling.    Testing/Procedures To Be Arranged:  N/A  Follow-Up:   See Dr. Rayann Heman in 6 months    Other Special Instructions:   N/A  Call the [facility] office at [phone] if you have any questions, problems or concerns.

## 2014-12-16 ENCOUNTER — Telehealth: Payer: Self-pay | Admitting: Nurse Practitioner

## 2014-12-16 NOTE — Telephone Encounter (Signed)
[name] was not at desk from [facility] stated I will be called back

## 2014-12-16 NOTE — Telephone Encounter (Signed)
New Prob    Requesting clarification for directions on Lasix. Please call.

## 2014-12-22 ENCOUNTER — Other Ambulatory Visit: Payer: Self-pay | Admitting: Internal Medicine

## 2014-12-22 DIAGNOSIS — Z5181 Encounter for therapeutic drug level monitoring: Secondary | ICD-10-CM | POA: Diagnosis not present

## 2014-12-22 DIAGNOSIS — I4891 Unspecified atrial fibrillation: Secondary | ICD-10-CM | POA: Diagnosis not present

## 2014-12-22 LAB — BASIC METABOLIC PANEL
BUN/Creatinine Ratio: 24 (ref 11–26)
BUN: 17 mg/dL (ref 10–36)
CHLORIDE: 101 mmol/L (ref 96–108)
CO2: 30 mmol/L — ABNORMAL HIGH (ref 18–29)
Calcium: 9.3 mg/dL (ref 8.7–10.3)
Creatinine, Ser: 0.71 mg/dL (ref 0.57–1.00)
GFR calc Af Amer: 85 mL/min/{1.73_m2} (ref >59)
GFR calc non Af Amer: 74 mL/min/{1.73_m2} (ref >59)
GLUCOSE: 95 mg/dL (ref 65–99)
Potassium: 3.5 mmol/L (ref 3.5–5.2)
Sodium: 141 mmol/L (ref 134–144)

## 2014-12-22 LAB — CBC WITH DIFFERENTIAL/PLATELET
Basophils Absolute: 0 10*3/uL (ref 0.0–0.2)
Basos: 0 %
EOS (ABSOLUTE): 0.1 10*3/uL (ref 0.0–0.4)
Eos: 1 %
Hematocrit: 40.8 % (ref 34.0–46.6)
Hemoglobin: 14.3 g/dL (ref 11.1–15.9)
LYMPHS ABS: 1.5 10*3/uL (ref 0.7–3.1)
LYMPHS: 27 %
MCH: 30.1 pg (ref 26.6–33.0)
MCHC: 35 g/dL (ref 31.5–35.7)
MCV: 86 fL (ref 79–97)
MONOCYTES: 11 %
Monocytes Absolute: 0.6 10*3/uL (ref 0.1–0.9)
Neutrophils Absolute: 3.4 10*3/uL (ref 1.4–7.0)
Neutrophils: 61 %
PLATELETS: 165 10*3/uL (ref 150–379)
RBC: 4.75 x10E6/uL (ref 3.77–5.28)
RDW: 13.9 % (ref 12.3–15.4)
WBC: 5.7 10*3/uL (ref 3.4–10.8)

## 2015-01-11 ENCOUNTER — Ambulatory Visit: Payer: Medicare Other | Admitting: Nurse Practitioner

## 2015-02-01 ENCOUNTER — Ambulatory Visit: Payer: Medicare Other | Admitting: Nurse Practitioner

## 2015-03-01 DIAGNOSIS — W1839XA Other fall on same level, initial encounter: Secondary | ICD-10-CM | POA: Diagnosis not present

## 2015-03-01 DIAGNOSIS — M5489 Other dorsalgia: Secondary | ICD-10-CM | POA: Diagnosis not present

## 2015-03-01 DIAGNOSIS — S30810A Abrasion of lower back and pelvis, initial encounter: Secondary | ICD-10-CM | POA: Diagnosis not present

## 2015-03-01 DIAGNOSIS — S20219A Contusion of unspecified front wall of thorax, initial encounter: Secondary | ICD-10-CM | POA: Diagnosis not present

## 2015-03-02 ENCOUNTER — Ambulatory Visit: Payer: Medicare Other | Admitting: Podiatry

## 2015-03-05 DIAGNOSIS — W182XXA Fall in (into) shower or empty bathtub, initial encounter: Secondary | ICD-10-CM | POA: Diagnosis not present

## 2015-03-05 DIAGNOSIS — R0781 Pleurodynia: Secondary | ICD-10-CM | POA: Diagnosis not present

## 2015-03-05 DIAGNOSIS — M5489 Other dorsalgia: Secondary | ICD-10-CM | POA: Diagnosis not present

## 2015-03-06 DIAGNOSIS — I5033 Acute on chronic diastolic (congestive) heart failure: Secondary | ICD-10-CM | POA: Diagnosis not present

## 2015-03-06 DIAGNOSIS — S2241XD Multiple fractures of ribs, right side, subsequent encounter for fracture with routine healing: Secondary | ICD-10-CM | POA: Diagnosis not present

## 2015-03-06 DIAGNOSIS — R262 Difficulty in walking, not elsewhere classified: Secondary | ICD-10-CM | POA: Diagnosis not present

## 2015-03-06 DIAGNOSIS — I480 Paroxysmal atrial fibrillation: Secondary | ICD-10-CM | POA: Diagnosis not present

## 2015-03-06 DIAGNOSIS — Z9181 History of falling: Secondary | ICD-10-CM | POA: Diagnosis not present

## 2015-03-06 DIAGNOSIS — S300XXD Contusion of lower back and pelvis, subsequent encounter: Secondary | ICD-10-CM | POA: Diagnosis not present

## 2015-03-06 DIAGNOSIS — I10 Essential (primary) hypertension: Secondary | ICD-10-CM | POA: Diagnosis not present

## 2015-03-06 DIAGNOSIS — D510 Vitamin B12 deficiency anemia due to intrinsic factor deficiency: Secondary | ICD-10-CM | POA: Diagnosis not present

## 2015-03-06 DIAGNOSIS — Z87891 Personal history of nicotine dependence: Secondary | ICD-10-CM | POA: Diagnosis not present

## 2015-03-06 DIAGNOSIS — Z79899 Other long term (current) drug therapy: Secondary | ICD-10-CM | POA: Diagnosis not present

## 2015-03-06 DIAGNOSIS — E119 Type 2 diabetes mellitus without complications: Secondary | ICD-10-CM | POA: Diagnosis not present

## 2015-03-08 DIAGNOSIS — I5033 Acute on chronic diastolic (congestive) heart failure: Secondary | ICD-10-CM | POA: Diagnosis not present

## 2015-03-08 DIAGNOSIS — E119 Type 2 diabetes mellitus without complications: Secondary | ICD-10-CM | POA: Diagnosis not present

## 2015-03-08 DIAGNOSIS — I10 Essential (primary) hypertension: Secondary | ICD-10-CM | POA: Diagnosis not present

## 2015-03-08 DIAGNOSIS — I480 Paroxysmal atrial fibrillation: Secondary | ICD-10-CM | POA: Diagnosis not present

## 2015-03-08 DIAGNOSIS — S300XXD Contusion of lower back and pelvis, subsequent encounter: Secondary | ICD-10-CM | POA: Diagnosis not present

## 2015-03-08 DIAGNOSIS — S2241XD Multiple fractures of ribs, right side, subsequent encounter for fracture with routine healing: Secondary | ICD-10-CM | POA: Diagnosis not present

## 2015-03-10 DIAGNOSIS — E119 Type 2 diabetes mellitus without complications: Secondary | ICD-10-CM | POA: Diagnosis not present

## 2015-03-10 DIAGNOSIS — S2241XD Multiple fractures of ribs, right side, subsequent encounter for fracture with routine healing: Secondary | ICD-10-CM | POA: Diagnosis not present

## 2015-03-10 DIAGNOSIS — I5033 Acute on chronic diastolic (congestive) heart failure: Secondary | ICD-10-CM | POA: Diagnosis not present

## 2015-03-10 DIAGNOSIS — S300XXD Contusion of lower back and pelvis, subsequent encounter: Secondary | ICD-10-CM | POA: Diagnosis not present

## 2015-03-10 DIAGNOSIS — I10 Essential (primary) hypertension: Secondary | ICD-10-CM | POA: Diagnosis not present

## 2015-03-10 DIAGNOSIS — I480 Paroxysmal atrial fibrillation: Secondary | ICD-10-CM | POA: Diagnosis not present

## 2015-03-11 DIAGNOSIS — R600 Localized edema: Secondary | ICD-10-CM | POA: Diagnosis not present

## 2015-03-11 DIAGNOSIS — I4891 Unspecified atrial fibrillation: Secondary | ICD-10-CM | POA: Diagnosis not present

## 2015-03-11 DIAGNOSIS — Z23 Encounter for immunization: Secondary | ICD-10-CM | POA: Diagnosis not present

## 2015-03-11 DIAGNOSIS — E780 Pure hypercholesterolemia: Secondary | ICD-10-CM | POA: Diagnosis not present

## 2015-03-11 DIAGNOSIS — E039 Hypothyroidism, unspecified: Secondary | ICD-10-CM | POA: Diagnosis not present

## 2015-03-11 DIAGNOSIS — I10 Essential (primary) hypertension: Secondary | ICD-10-CM | POA: Diagnosis not present

## 2015-03-11 DIAGNOSIS — E118 Type 2 diabetes mellitus with unspecified complications: Secondary | ICD-10-CM | POA: Diagnosis not present

## 2015-03-11 DIAGNOSIS — R609 Edema, unspecified: Secondary | ICD-10-CM | POA: Diagnosis not present

## 2015-03-11 DIAGNOSIS — D510 Vitamin B12 deficiency anemia due to intrinsic factor deficiency: Secondary | ICD-10-CM | POA: Diagnosis not present

## 2015-03-15 DIAGNOSIS — E119 Type 2 diabetes mellitus without complications: Secondary | ICD-10-CM | POA: Diagnosis not present

## 2015-03-15 DIAGNOSIS — I771 Stricture of artery: Secondary | ICD-10-CM | POA: Diagnosis not present

## 2015-03-15 DIAGNOSIS — I480 Paroxysmal atrial fibrillation: Secondary | ICD-10-CM | POA: Diagnosis not present

## 2015-03-15 DIAGNOSIS — S2241XD Multiple fractures of ribs, right side, subsequent encounter for fracture with routine healing: Secondary | ICD-10-CM | POA: Diagnosis not present

## 2015-03-15 DIAGNOSIS — S300XXD Contusion of lower back and pelvis, subsequent encounter: Secondary | ICD-10-CM | POA: Diagnosis not present

## 2015-03-15 DIAGNOSIS — I5033 Acute on chronic diastolic (congestive) heart failure: Secondary | ICD-10-CM | POA: Diagnosis not present

## 2015-03-15 DIAGNOSIS — I10 Essential (primary) hypertension: Secondary | ICD-10-CM | POA: Diagnosis not present

## 2015-03-16 DIAGNOSIS — I480 Paroxysmal atrial fibrillation: Secondary | ICD-10-CM | POA: Diagnosis not present

## 2015-03-16 DIAGNOSIS — I10 Essential (primary) hypertension: Secondary | ICD-10-CM | POA: Diagnosis not present

## 2015-03-16 DIAGNOSIS — S2241XD Multiple fractures of ribs, right side, subsequent encounter for fracture with routine healing: Secondary | ICD-10-CM | POA: Diagnosis not present

## 2015-03-16 DIAGNOSIS — I5033 Acute on chronic diastolic (congestive) heart failure: Secondary | ICD-10-CM | POA: Diagnosis not present

## 2015-03-16 DIAGNOSIS — S300XXD Contusion of lower back and pelvis, subsequent encounter: Secondary | ICD-10-CM | POA: Diagnosis not present

## 2015-03-16 DIAGNOSIS — E119 Type 2 diabetes mellitus without complications: Secondary | ICD-10-CM | POA: Diagnosis not present

## 2015-03-17 DIAGNOSIS — R2689 Other abnormalities of gait and mobility: Secondary | ICD-10-CM | POA: Diagnosis not present

## 2015-03-17 DIAGNOSIS — E119 Type 2 diabetes mellitus without complications: Secondary | ICD-10-CM | POA: Diagnosis not present

## 2015-03-17 DIAGNOSIS — K5660 Unspecified intestinal obstruction: Secondary | ICD-10-CM | POA: Diagnosis not present

## 2015-03-17 DIAGNOSIS — D649 Anemia, unspecified: Secondary | ICD-10-CM | POA: Diagnosis not present

## 2015-03-17 DIAGNOSIS — M2681 Anterior soft tissue impingement: Secondary | ICD-10-CM | POA: Diagnosis not present

## 2015-03-17 DIAGNOSIS — G4733 Obstructive sleep apnea (adult) (pediatric): Secondary | ICD-10-CM | POA: Diagnosis not present

## 2015-03-17 DIAGNOSIS — S300XXD Contusion of lower back and pelvis, subsequent encounter: Secondary | ICD-10-CM | POA: Diagnosis not present

## 2015-03-17 DIAGNOSIS — R279 Unspecified lack of coordination: Secondary | ICD-10-CM | POA: Diagnosis not present

## 2015-03-17 DIAGNOSIS — S2241XD Multiple fractures of ribs, right side, subsequent encounter for fracture with routine healing: Secondary | ICD-10-CM | POA: Diagnosis not present

## 2015-03-17 DIAGNOSIS — I480 Paroxysmal atrial fibrillation: Secondary | ICD-10-CM | POA: Diagnosis not present

## 2015-03-17 DIAGNOSIS — I4891 Unspecified atrial fibrillation: Secondary | ICD-10-CM | POA: Diagnosis not present

## 2015-03-17 DIAGNOSIS — I10 Essential (primary) hypertension: Secondary | ICD-10-CM | POA: Diagnosis not present

## 2015-03-17 DIAGNOSIS — I5033 Acute on chronic diastolic (congestive) heart failure: Secondary | ICD-10-CM | POA: Diagnosis not present

## 2015-03-21 DIAGNOSIS — S300XXD Contusion of lower back and pelvis, subsequent encounter: Secondary | ICD-10-CM | POA: Diagnosis not present

## 2015-03-21 DIAGNOSIS — E119 Type 2 diabetes mellitus without complications: Secondary | ICD-10-CM | POA: Diagnosis not present

## 2015-03-21 DIAGNOSIS — I480 Paroxysmal atrial fibrillation: Secondary | ICD-10-CM | POA: Diagnosis not present

## 2015-03-21 DIAGNOSIS — S2241XD Multiple fractures of ribs, right side, subsequent encounter for fracture with routine healing: Secondary | ICD-10-CM | POA: Diagnosis not present

## 2015-03-21 DIAGNOSIS — I5033 Acute on chronic diastolic (congestive) heart failure: Secondary | ICD-10-CM | POA: Diagnosis not present

## 2015-03-21 DIAGNOSIS — I10 Essential (primary) hypertension: Secondary | ICD-10-CM | POA: Diagnosis not present

## 2015-03-23 ENCOUNTER — Ambulatory Visit: Payer: Medicare Other | Admitting: Podiatry

## 2015-03-23 DIAGNOSIS — I10 Essential (primary) hypertension: Secondary | ICD-10-CM | POA: Diagnosis not present

## 2015-03-23 DIAGNOSIS — E119 Type 2 diabetes mellitus without complications: Secondary | ICD-10-CM | POA: Diagnosis not present

## 2015-03-23 DIAGNOSIS — I5033 Acute on chronic diastolic (congestive) heart failure: Secondary | ICD-10-CM | POA: Diagnosis not present

## 2015-03-23 DIAGNOSIS — S2241XD Multiple fractures of ribs, right side, subsequent encounter for fracture with routine healing: Secondary | ICD-10-CM | POA: Diagnosis not present

## 2015-03-23 DIAGNOSIS — I480 Paroxysmal atrial fibrillation: Secondary | ICD-10-CM | POA: Diagnosis not present

## 2015-03-23 DIAGNOSIS — S300XXD Contusion of lower back and pelvis, subsequent encounter: Secondary | ICD-10-CM | POA: Diagnosis not present

## 2015-03-24 DIAGNOSIS — I480 Paroxysmal atrial fibrillation: Secondary | ICD-10-CM | POA: Diagnosis not present

## 2015-03-24 DIAGNOSIS — S300XXD Contusion of lower back and pelvis, subsequent encounter: Secondary | ICD-10-CM | POA: Diagnosis not present

## 2015-03-24 DIAGNOSIS — R279 Unspecified lack of coordination: Secondary | ICD-10-CM | POA: Diagnosis not present

## 2015-03-24 DIAGNOSIS — I5033 Acute on chronic diastolic (congestive) heart failure: Secondary | ICD-10-CM | POA: Diagnosis not present

## 2015-03-24 DIAGNOSIS — M2681 Anterior soft tissue impingement: Secondary | ICD-10-CM | POA: Diagnosis not present

## 2015-03-24 DIAGNOSIS — I10 Essential (primary) hypertension: Secondary | ICD-10-CM | POA: Diagnosis not present

## 2015-03-24 DIAGNOSIS — I4891 Unspecified atrial fibrillation: Secondary | ICD-10-CM | POA: Diagnosis not present

## 2015-03-24 DIAGNOSIS — S2241XD Multiple fractures of ribs, right side, subsequent encounter for fracture with routine healing: Secondary | ICD-10-CM | POA: Diagnosis not present

## 2015-03-24 DIAGNOSIS — D649 Anemia, unspecified: Secondary | ICD-10-CM | POA: Diagnosis not present

## 2015-03-24 DIAGNOSIS — E119 Type 2 diabetes mellitus without complications: Secondary | ICD-10-CM | POA: Diagnosis not present

## 2015-03-28 DIAGNOSIS — I5033 Acute on chronic diastolic (congestive) heart failure: Secondary | ICD-10-CM | POA: Diagnosis not present

## 2015-03-28 DIAGNOSIS — S300XXD Contusion of lower back and pelvis, subsequent encounter: Secondary | ICD-10-CM | POA: Diagnosis not present

## 2015-03-28 DIAGNOSIS — I480 Paroxysmal atrial fibrillation: Secondary | ICD-10-CM | POA: Diagnosis not present

## 2015-03-28 DIAGNOSIS — I10 Essential (primary) hypertension: Secondary | ICD-10-CM | POA: Diagnosis not present

## 2015-03-28 DIAGNOSIS — S2241XD Multiple fractures of ribs, right side, subsequent encounter for fracture with routine healing: Secondary | ICD-10-CM | POA: Diagnosis not present

## 2015-03-28 DIAGNOSIS — E119 Type 2 diabetes mellitus without complications: Secondary | ICD-10-CM | POA: Diagnosis not present

## 2015-03-30 DIAGNOSIS — I10 Essential (primary) hypertension: Secondary | ICD-10-CM | POA: Diagnosis not present

## 2015-03-30 DIAGNOSIS — S2241XD Multiple fractures of ribs, right side, subsequent encounter for fracture with routine healing: Secondary | ICD-10-CM | POA: Diagnosis not present

## 2015-03-30 DIAGNOSIS — I480 Paroxysmal atrial fibrillation: Secondary | ICD-10-CM | POA: Diagnosis not present

## 2015-03-30 DIAGNOSIS — E119 Type 2 diabetes mellitus without complications: Secondary | ICD-10-CM | POA: Diagnosis not present

## 2015-03-30 DIAGNOSIS — S300XXD Contusion of lower back and pelvis, subsequent encounter: Secondary | ICD-10-CM | POA: Diagnosis not present

## 2015-03-30 DIAGNOSIS — I5033 Acute on chronic diastolic (congestive) heart failure: Secondary | ICD-10-CM | POA: Diagnosis not present

## 2015-04-04 DIAGNOSIS — I10 Essential (primary) hypertension: Secondary | ICD-10-CM | POA: Diagnosis not present

## 2015-04-04 DIAGNOSIS — I480 Paroxysmal atrial fibrillation: Secondary | ICD-10-CM | POA: Diagnosis not present

## 2015-04-04 DIAGNOSIS — S2241XD Multiple fractures of ribs, right side, subsequent encounter for fracture with routine healing: Secondary | ICD-10-CM | POA: Diagnosis not present

## 2015-04-04 DIAGNOSIS — S300XXD Contusion of lower back and pelvis, subsequent encounter: Secondary | ICD-10-CM | POA: Diagnosis not present

## 2015-04-04 DIAGNOSIS — I5033 Acute on chronic diastolic (congestive) heart failure: Secondary | ICD-10-CM | POA: Diagnosis not present

## 2015-04-04 DIAGNOSIS — E119 Type 2 diabetes mellitus without complications: Secondary | ICD-10-CM | POA: Diagnosis not present

## 2015-04-05 ENCOUNTER — Ambulatory Visit (INDEPENDENT_AMBULATORY_CARE_PROVIDER_SITE_OTHER): Payer: Medicare Other | Admitting: Podiatry

## 2015-04-05 ENCOUNTER — Encounter: Payer: Self-pay | Admitting: Podiatry

## 2015-04-05 DIAGNOSIS — M79676 Pain in unspecified toe(s): Secondary | ICD-10-CM

## 2015-04-05 DIAGNOSIS — B351 Tinea unguium: Secondary | ICD-10-CM | POA: Diagnosis not present

## 2015-04-05 NOTE — Progress Notes (Signed)
Patient ID: [name], female   DOB: [birth year], 81 y.o.   MRN: [id number]  Subjective: This patient presents for scheduled visit complaining of painful toenails and requests nail debridement. Patient is under care for venous ulcers  Objective: Patient is seated in a wheelchair and unable to transfer Lower legs bilaterally are wrap with Unna boot dressings The toenails are elongated, brittle, discolored, incurvated and tender to direct palpation 6-10  Assessment: Symptomatic onychomycoses 6-10 Diabetic with a history of peripheral neuropathy  Plan: Debridement toenails 10 mechanically and electronically without any bleeding  Reappoint 3 months

## 2015-04-06 DIAGNOSIS — I480 Paroxysmal atrial fibrillation: Secondary | ICD-10-CM | POA: Diagnosis not present

## 2015-04-06 DIAGNOSIS — I10 Essential (primary) hypertension: Secondary | ICD-10-CM | POA: Diagnosis not present

## 2015-04-06 DIAGNOSIS — I5033 Acute on chronic diastolic (congestive) heart failure: Secondary | ICD-10-CM | POA: Diagnosis not present

## 2015-04-06 DIAGNOSIS — E119 Type 2 diabetes mellitus without complications: Secondary | ICD-10-CM | POA: Diagnosis not present

## 2015-04-06 DIAGNOSIS — S300XXD Contusion of lower back and pelvis, subsequent encounter: Secondary | ICD-10-CM | POA: Diagnosis not present

## 2015-04-06 DIAGNOSIS — S2241XD Multiple fractures of ribs, right side, subsequent encounter for fracture with routine healing: Secondary | ICD-10-CM | POA: Diagnosis not present

## 2015-04-07 DIAGNOSIS — I5033 Acute on chronic diastolic (congestive) heart failure: Secondary | ICD-10-CM | POA: Diagnosis not present

## 2015-04-07 DIAGNOSIS — S2241XD Multiple fractures of ribs, right side, subsequent encounter for fracture with routine healing: Secondary | ICD-10-CM | POA: Diagnosis not present

## 2015-04-07 DIAGNOSIS — I480 Paroxysmal atrial fibrillation: Secondary | ICD-10-CM | POA: Diagnosis not present

## 2015-04-07 DIAGNOSIS — S300XXD Contusion of lower back and pelvis, subsequent encounter: Secondary | ICD-10-CM | POA: Diagnosis not present

## 2015-04-07 DIAGNOSIS — I10 Essential (primary) hypertension: Secondary | ICD-10-CM | POA: Diagnosis not present

## 2015-04-07 DIAGNOSIS — E119 Type 2 diabetes mellitus without complications: Secondary | ICD-10-CM | POA: Diagnosis not present

## 2015-04-12 DIAGNOSIS — I5033 Acute on chronic diastolic (congestive) heart failure: Secondary | ICD-10-CM | POA: Diagnosis not present

## 2015-04-12 DIAGNOSIS — I480 Paroxysmal atrial fibrillation: Secondary | ICD-10-CM | POA: Diagnosis not present

## 2015-04-12 DIAGNOSIS — I10 Essential (primary) hypertension: Secondary | ICD-10-CM | POA: Diagnosis not present

## 2015-04-12 DIAGNOSIS — S300XXD Contusion of lower back and pelvis, subsequent encounter: Secondary | ICD-10-CM | POA: Diagnosis not present

## 2015-04-12 DIAGNOSIS — E119 Type 2 diabetes mellitus without complications: Secondary | ICD-10-CM | POA: Diagnosis not present

## 2015-04-12 DIAGNOSIS — S2241XD Multiple fractures of ribs, right side, subsequent encounter for fracture with routine healing: Secondary | ICD-10-CM | POA: Diagnosis not present

## 2015-04-13 DIAGNOSIS — S2241XD Multiple fractures of ribs, right side, subsequent encounter for fracture with routine healing: Secondary | ICD-10-CM | POA: Diagnosis not present

## 2015-04-13 DIAGNOSIS — I10 Essential (primary) hypertension: Secondary | ICD-10-CM | POA: Diagnosis not present

## 2015-04-13 DIAGNOSIS — E119 Type 2 diabetes mellitus without complications: Secondary | ICD-10-CM | POA: Diagnosis not present

## 2015-04-13 DIAGNOSIS — I5033 Acute on chronic diastolic (congestive) heart failure: Secondary | ICD-10-CM | POA: Diagnosis not present

## 2015-04-13 DIAGNOSIS — I480 Paroxysmal atrial fibrillation: Secondary | ICD-10-CM | POA: Diagnosis not present

## 2015-04-13 DIAGNOSIS — S300XXD Contusion of lower back and pelvis, subsequent encounter: Secondary | ICD-10-CM | POA: Diagnosis not present

## 2015-04-14 DIAGNOSIS — I5033 Acute on chronic diastolic (congestive) heart failure: Secondary | ICD-10-CM | POA: Diagnosis not present

## 2015-04-14 DIAGNOSIS — E119 Type 2 diabetes mellitus without complications: Secondary | ICD-10-CM | POA: Diagnosis not present

## 2015-04-14 DIAGNOSIS — I480 Paroxysmal atrial fibrillation: Secondary | ICD-10-CM | POA: Diagnosis not present

## 2015-04-14 DIAGNOSIS — S2241XD Multiple fractures of ribs, right side, subsequent encounter for fracture with routine healing: Secondary | ICD-10-CM | POA: Diagnosis not present

## 2015-04-14 DIAGNOSIS — S300XXD Contusion of lower back and pelvis, subsequent encounter: Secondary | ICD-10-CM | POA: Diagnosis not present

## 2015-04-14 DIAGNOSIS — I10 Essential (primary) hypertension: Secondary | ICD-10-CM | POA: Diagnosis not present

## 2015-04-20 DIAGNOSIS — S300XXD Contusion of lower back and pelvis, subsequent encounter: Secondary | ICD-10-CM | POA: Diagnosis not present

## 2015-04-20 DIAGNOSIS — E119 Type 2 diabetes mellitus without complications: Secondary | ICD-10-CM | POA: Diagnosis not present

## 2015-04-20 DIAGNOSIS — S2241XD Multiple fractures of ribs, right side, subsequent encounter for fracture with routine healing: Secondary | ICD-10-CM | POA: Diagnosis not present

## 2015-04-20 DIAGNOSIS — I10 Essential (primary) hypertension: Secondary | ICD-10-CM | POA: Diagnosis not present

## 2015-04-20 DIAGNOSIS — I480 Paroxysmal atrial fibrillation: Secondary | ICD-10-CM | POA: Diagnosis not present

## 2015-04-20 DIAGNOSIS — I5033 Acute on chronic diastolic (congestive) heart failure: Secondary | ICD-10-CM | POA: Diagnosis not present

## 2015-04-27 DIAGNOSIS — I10 Essential (primary) hypertension: Secondary | ICD-10-CM | POA: Diagnosis not present

## 2015-04-27 DIAGNOSIS — I480 Paroxysmal atrial fibrillation: Secondary | ICD-10-CM | POA: Diagnosis not present

## 2015-04-27 DIAGNOSIS — E119 Type 2 diabetes mellitus without complications: Secondary | ICD-10-CM | POA: Diagnosis not present

## 2015-04-27 DIAGNOSIS — I5033 Acute on chronic diastolic (congestive) heart failure: Secondary | ICD-10-CM | POA: Diagnosis not present

## 2015-04-27 DIAGNOSIS — S300XXD Contusion of lower back and pelvis, subsequent encounter: Secondary | ICD-10-CM | POA: Diagnosis not present

## 2015-04-27 DIAGNOSIS — S2241XD Multiple fractures of ribs, right side, subsequent encounter for fracture with routine healing: Secondary | ICD-10-CM | POA: Diagnosis not present

## 2015-05-04 DIAGNOSIS — S300XXD Contusion of lower back and pelvis, subsequent encounter: Secondary | ICD-10-CM | POA: Diagnosis not present

## 2015-05-04 DIAGNOSIS — E119 Type 2 diabetes mellitus without complications: Secondary | ICD-10-CM | POA: Diagnosis not present

## 2015-05-04 DIAGNOSIS — I480 Paroxysmal atrial fibrillation: Secondary | ICD-10-CM | POA: Diagnosis not present

## 2015-05-04 DIAGNOSIS — I5033 Acute on chronic diastolic (congestive) heart failure: Secondary | ICD-10-CM | POA: Diagnosis not present

## 2015-05-04 DIAGNOSIS — S2241XD Multiple fractures of ribs, right side, subsequent encounter for fracture with routine healing: Secondary | ICD-10-CM | POA: Diagnosis not present

## 2015-05-04 DIAGNOSIS — I10 Essential (primary) hypertension: Secondary | ICD-10-CM | POA: Diagnosis not present

## 2015-06-28 ENCOUNTER — Ambulatory Visit (INDEPENDENT_AMBULATORY_CARE_PROVIDER_SITE_OTHER): Payer: Medicare Other | Admitting: Podiatry

## 2015-06-28 ENCOUNTER — Encounter: Payer: Self-pay | Admitting: Podiatry

## 2015-06-28 DIAGNOSIS — B351 Tinea unguium: Secondary | ICD-10-CM

## 2015-06-28 DIAGNOSIS — E0842 Diabetes mellitus due to underlying condition with diabetic polyneuropathy: Secondary | ICD-10-CM

## 2015-06-28 DIAGNOSIS — M79676 Pain in unspecified toe(s): Secondary | ICD-10-CM | POA: Diagnosis not present

## 2015-06-28 DIAGNOSIS — M2040 Other hammer toe(s) (acquired), unspecified foot: Secondary | ICD-10-CM

## 2015-06-28 NOTE — Progress Notes (Signed)
Patient ID: DERRICK EPPERS, female   DOB: 10-06-1919, 81 y.o.   MRN: [id number]  Subjective: This patient presents for scheduled visit stating that her toenails are uncomfortable when she wears shoes as requesting debridement of her toenails. She also states that she would like to have replacement diabetic shoes  Objective: Pleasant patient appears orientated 3 able to answer questions Peripheral edema bilaterally DP pulses 2/4 bilaterally PT pulses nonpalpable bilaterally Capillary reflex immediate bilaterally  Neurological: Sensation to 10 g monofilament wire intact 1/5 right and 0/5 left Vibratory sensation nonreactive bilaterally Ankle reflexes equal and reactive bilaterally  Dermatological: Varicosities dorsal foot and ankle bilaterally The toenails are elongated, discolored, incurvated, deformed and tender direct palpation 6-10  Musculoskeletal: Adductovarus fifth toes bilaterally  Assessment: Type II diabetic with peripheral neuropathy Peripheral edema bilaterally Absent posterior tibial pulses bilaterally Symptomatic onychomycoses 6-10  Plan: Debridement toenails 6-10 mechanically electronically without a bleeding  Obtain certification for diabetic shoes from Dr. Lennette Bihari little  Reappoint 3 months and return upon certification for diabetic shoes for measuring

## 2015-06-28 NOTE — Patient Instructions (Signed)
We will obtain certification for diabetic shoes and notify you to return for measuring for the shoes I will reschedule for your regular visit for trimming of your thickened, elongated toenails  Diabetes and Foot Care Diabetes may cause you to have problems because of poor blood supply (circulation) to your feet and legs. This may cause the skin on your feet to become thinner, break easier, and heal more slowly. Your skin may become dry, and the skin may peel and crack. You may also have nerve damage in your legs and feet causing decreased feeling in them. You may not notice minor injuries to your feet that could lead to infections or more serious problems. Taking care of your feet is one of the most important things you can do for yourself.  HOME CARE INSTRUCTIONS  Wear shoes at all times, even in the house. Do not go barefoot. Bare feet are easily injured.  Check your feet daily for blisters, cuts, and redness. If you cannot see the bottom of your feet, use a mirror or ask someone for help.  Wash your feet with warm water (do not use hot water) and mild soap. Then pat your feet and the areas between your toes until they are completely dry. Do not soak your feet as this can dry your skin.  Apply a moisturizing lotion or petroleum jelly (that does not contain alcohol and is unscented) to the skin on your feet and to dry, brittle toenails. Do not apply lotion between your toes.  Trim your toenails straight across. Do not dig under them or around the cuticle. File the edges of your nails with an emery board or nail file.  Do not cut corns or calluses or try to remove them with medicine.  Wear clean socks or stockings every day. Make sure they are not too tight. Do not wear knee-high stockings since they may decrease blood flow to your legs.  Wear shoes that fit properly and have enough cushioning. To break in new shoes, wear them for just a few hours a day. This prevents you from injuring your  feet. Always look in your shoes before you put them on to be sure there are no objects inside.  Do not cross your legs. This may decrease the blood flow to your feet.  If you find a minor scrape, cut, or break in the skin on your feet, keep it and the skin around it clean and dry. These areas may be cleansed with mild soap and water. Do not cleanse the area with peroxide, alcohol, or iodine.  When you remove an adhesive bandage, be sure not to damage the skin around it.  If you have a wound, look at it several times a day to make sure it is healing.  Do not use heating pads or hot water bottles. They may burn your skin. If you have lost feeling in your feet or legs, you may not know it is happening until it is too late.  Make sure your health care provider performs a complete foot exam at least annually or more often if you have foot problems. Report any cuts, sores, or bruises to your health care provider immediately. SEEK MEDICAL CARE IF:   You have an injury that is not healing.  You have cuts or breaks in the skin.  You have an ingrown nail.  You notice redness on your legs or feet.  You feel burning or tingling in your legs or feet.  You have  pain or cramps in your legs and feet.  Your legs or feet are numb.  Your feet always feel cold. SEEK IMMEDIATE MEDICAL CARE IF:   There is increasing redness, swelling, or pain in or around a wound.  There is a red line that goes up your leg.  Pus is coming from a wound.  You develop a fever or as directed by your health care provider.  You notice a bad smell coming from an ulcer or wound.   This information is not intended to replace advice given to you by your health care provider. Make sure you discuss any questions you have with your health care provider.   Document Released: 06/15/2000 Document Revised: 02/18/2013 Document Reviewed: 11/25/2012 Elsevier Interactive Patient Education [employer].

## 2015-07-05 ENCOUNTER — Ambulatory Visit: Payer: Medicare Other | Admitting: Podiatry

## 2015-07-19 ENCOUNTER — Encounter (HOSPITAL_COMMUNITY): Payer: Self-pay | Admitting: Emergency Medicine

## 2015-07-19 ENCOUNTER — Inpatient Hospital Stay (HOSPITAL_COMMUNITY): Payer: Medicare Other

## 2015-07-19 ENCOUNTER — Other Ambulatory Visit (HOSPITAL_COMMUNITY): Payer: Medicare Other

## 2015-07-19 ENCOUNTER — Inpatient Hospital Stay (HOSPITAL_COMMUNITY)
Admission: EM | Admit: 2015-07-19 | Discharge: 2015-07-22 | DRG: 194 | Disposition: A | Discharge status: Home Health | Payer: Medicare Other | Attending: Internal Medicine | Admitting: Internal Medicine

## 2015-07-19 ENCOUNTER — Emergency Department (HOSPITAL_COMMUNITY): Payer: Medicare Other

## 2015-07-19 DIAGNOSIS — I5032 Chronic diastolic (congestive) heart failure: Secondary | ICD-10-CM | POA: Diagnosis present

## 2015-07-19 DIAGNOSIS — R05 Cough: Secondary | ICD-10-CM | POA: Diagnosis not present

## 2015-07-19 DIAGNOSIS — E860 Dehydration: Secondary | ICD-10-CM | POA: Diagnosis present

## 2015-07-19 DIAGNOSIS — I4821 Permanent atrial fibrillation: Secondary | ICD-10-CM | POA: Diagnosis present

## 2015-07-19 DIAGNOSIS — R0902 Hypoxemia: Secondary | ICD-10-CM | POA: Diagnosis present

## 2015-07-19 DIAGNOSIS — R059 Cough, unspecified: Secondary | ICD-10-CM

## 2015-07-19 DIAGNOSIS — G4733 Obstructive sleep apnea (adult) (pediatric): Secondary | ICD-10-CM | POA: Diagnosis present

## 2015-07-19 DIAGNOSIS — Y95 Nosocomial condition: Secondary | ICD-10-CM | POA: Diagnosis present

## 2015-07-19 DIAGNOSIS — I482 Chronic atrial fibrillation: Secondary | ICD-10-CM | POA: Diagnosis present

## 2015-07-19 DIAGNOSIS — Z8551 Personal history of malignant neoplasm of bladder: Secondary | ICD-10-CM | POA: Diagnosis not present

## 2015-07-19 DIAGNOSIS — E1165 Type 2 diabetes mellitus with hyperglycemia: Secondary | ICD-10-CM | POA: Diagnosis present

## 2015-07-19 DIAGNOSIS — J101 Influenza due to other identified influenza virus with other respiratory manifestations: Secondary | ICD-10-CM | POA: Diagnosis present

## 2015-07-19 DIAGNOSIS — J09X1 Influenza due to identified novel influenza A virus with pneumonia: Principal | ICD-10-CM | POA: Diagnosis present

## 2015-07-19 DIAGNOSIS — R0602 Shortness of breath: Secondary | ICD-10-CM

## 2015-07-19 DIAGNOSIS — E119 Type 2 diabetes mellitus without complications: Secondary | ICD-10-CM

## 2015-07-19 DIAGNOSIS — I4891 Unspecified atrial fibrillation: Secondary | ICD-10-CM

## 2015-07-19 DIAGNOSIS — I5043 Acute on chronic combined systolic (congestive) and diastolic (congestive) heart failure: Secondary | ICD-10-CM | POA: Diagnosis present

## 2015-07-19 DIAGNOSIS — Z8543 Personal history of malignant neoplasm of ovary: Secondary | ICD-10-CM

## 2015-07-19 DIAGNOSIS — Z87891 Personal history of nicotine dependence: Secondary | ICD-10-CM | POA: Diagnosis not present

## 2015-07-19 DIAGNOSIS — J189 Pneumonia, unspecified organism: Secondary | ICD-10-CM | POA: Diagnosis not present

## 2015-07-19 DIAGNOSIS — E039 Hypothyroidism, unspecified: Secondary | ICD-10-CM | POA: Diagnosis present

## 2015-07-19 DIAGNOSIS — I872 Venous insufficiency (chronic) (peripheral): Secondary | ICD-10-CM | POA: Diagnosis present

## 2015-07-19 DIAGNOSIS — R0600 Dyspnea, unspecified: Secondary | ICD-10-CM | POA: Diagnosis not present

## 2015-07-19 DIAGNOSIS — J069 Acute upper respiratory infection, unspecified: Secondary | ICD-10-CM | POA: Diagnosis present

## 2015-07-19 HISTORY — DX: Pneumonia, unspecified organism: J18.9

## 2015-07-19 LAB — BASIC METABOLIC PANEL
Anion gap: 11 (ref 5–15)
BUN: 20 mg/dL (ref 6–20)
CALCIUM: 8.9 mg/dL (ref 8.9–10.3)
CHLORIDE: 104 mmol/L (ref 101–111)
CO2: 25 mmol/L (ref 22–32)
CREATININE: 0.88 mg/dL (ref 0.44–1.00)
GFR calc Af Amer: >60 mL/min (ref >60)
GFR calc non Af Amer: 55 mL/min — ABNORMAL LOW (ref >60)
Glucose, Bld: 140 mg/dL — ABNORMAL HIGH (ref 65–99)
Potassium: 3.7 mmol/L (ref 3.5–5.1)
Sodium: 140 mmol/L (ref 135–145)

## 2015-07-19 LAB — HEPATIC FUNCTION PANEL
ALBUMIN: 3.3 g/dL — AB (ref 3.5–5.0)
ALK PHOS: 51 U/L (ref 38–126)
ALT: 11 U/L — ABNORMAL LOW (ref 14–54)
AST: 23 U/L (ref 15–41)
BILIRUBIN INDIRECT: 0.9 mg/dL (ref 0.3–0.9)
Bilirubin, Direct: 0.2 mg/dL (ref 0.1–0.5)
TOTAL PROTEIN: 5.8 g/dL — AB (ref 6.5–8.1)
Total Bilirubin: 1.1 mg/dL (ref 0.3–1.2)

## 2015-07-19 LAB — CBC WITH DIFFERENTIAL/PLATELET
Basophils Absolute: 0 10*3/uL (ref 0.0–0.1)
Basophils Relative: 0 %
Eosinophils Absolute: 0 10*3/uL (ref 0.0–0.7)
Eosinophils Relative: 0 %
HEMATOCRIT: 45 % (ref 36.0–46.0)
HEMOGLOBIN: 14.9 g/dL (ref 12.0–15.0)
LYMPHS ABS: 0.8 10*3/uL (ref 0.7–4.0)
Lymphocytes Relative: 8 %
MCH: 29.6 pg (ref 26.0–34.0)
MCHC: 33.1 g/dL (ref 30.0–36.0)
MCV: 89.3 fL (ref 78.0–100.0)
MONOS PCT: 8 %
Monocytes Absolute: 0.8 10*3/uL (ref 0.1–1.0)
NEUTROS ABS: 7.5 10*3/uL (ref 1.7–7.7)
Neutrophils Relative %: 84 %
Platelets: 123 10*3/uL — ABNORMAL LOW (ref 150–400)
RBC: 5.04 MIL/uL (ref 3.87–5.11)
RDW: 15 % (ref 11.5–15.5)
WBC: 9 10*3/uL (ref 4.0–10.5)

## 2015-07-19 LAB — I-STAT TROPONIN, ED: Troponin i, poc: 0.38 ng/mL (ref 0.00–0.08)

## 2015-07-19 LAB — URINALYSIS, ROUTINE W REFLEX MICROSCOPIC
BILIRUBIN URINE: NEGATIVE
Glucose, UA: NEGATIVE mg/dL
Ketones, ur: NEGATIVE mg/dL
Nitrite: NEGATIVE
PH: 6.5 (ref 5.0–8.0)
Protein, ur: 100 mg/dL — AB

## 2015-07-19 LAB — INFLUENZA PANEL BY PCR (TYPE A & B)
H1N1FLUPCR: NOT DETECTED
INFLAPCR: POSITIVE — AB
INFLBPCR: NEGATIVE

## 2015-07-19 LAB — STREP PNEUMONIAE URINARY ANTIGEN: STREP PNEUMO URINARY ANTIGEN: NEGATIVE

## 2015-07-19 LAB — TSH: TSH: 0.248 u[IU]/mL — AB (ref 0.350–4.500)

## 2015-07-19 LAB — GLUCOSE, CAPILLARY
Glucose-Capillary: 100 mg/dL — ABNORMAL HIGH (ref 65–99)
Glucose-Capillary: 87 mg/dL (ref 65–99)

## 2015-07-19 LAB — URINE MICROSCOPIC-ADD ON

## 2015-07-19 LAB — BRAIN NATRIURETIC PEPTIDE: B NATRIURETIC PEPTIDE 5: 137.3 pg/mL — AB (ref 0.0–100.0)

## 2015-07-19 LAB — TROPONIN I
Troponin I: 0.57 ng/mL (ref <0.031)
Troponin I: 0.32 ng/mL — ABNORMAL HIGH (ref <0.031)

## 2015-07-19 LAB — I-STAT CG4 LACTIC ACID, ED: Lactic Acid, Venous: 1.51 mmol/L (ref 0.5–2.0)

## 2015-07-19 LAB — MAGNESIUM: MAGNESIUM: 2 mg/dL (ref 1.7–2.4)

## 2015-07-19 MED ORDER — ALUM & MAG HYDROXIDE-SIMETH 200-200-20 MG/5ML PO SUSP: 30.0000 mL | Freq: Four times a day (QID) | ORAL | Status: DC | PRN

## 2015-07-19 MED ORDER — VANCOMYCIN HCL IN DEXTROSE 1-5 GM/200ML-% IV SOLN: 1000.0000 mg | Freq: Once | INTRAVENOUS | Status: DC

## 2015-07-19 MED ORDER — APIXABAN 5 MG PO TABS
5.0000 mg | ORAL_TABLET | Freq: Two times a day (BID) | ORAL | Status: DC
  Administered 2015-07-19 – 2015-07-22 (×6): 5 mg via ORAL
  Filled 2015-07-19 (×6): qty 1

## 2015-07-19 MED ORDER — ONDANSETRON HCL 4 MG PO TABS: 4.0000 mg | ORAL_TABLET | Freq: Four times a day (QID) | ORAL | Status: DC | PRN

## 2015-07-19 MED ORDER — ACETAMINOPHEN 325 MG PO TABS
650.0000 mg | ORAL_TABLET | Freq: Once | ORAL | Status: AC
  Administered 2015-07-19: 650 mg via ORAL
  Filled 2015-07-19: qty 2

## 2015-07-19 MED ORDER — ONDANSETRON HCL 4 MG/2ML IJ SOLN: 4.0000 mg | Freq: Four times a day (QID) | INTRAMUSCULAR | Status: DC | PRN

## 2015-07-19 MED ORDER — ACETAMINOPHEN 325 MG PO TABS: 650.0000 mg | ORAL_TABLET | Freq: Four times a day (QID) | ORAL | Status: DC | PRN

## 2015-07-19 MED ORDER — BISACODYL 10 MG RE SUPP
10.0000 mg | Freq: Every day | RECTAL | Status: DC | PRN
  Filled 2015-07-19 (×2): qty 1

## 2015-07-19 MED ORDER — SODIUM CHLORIDE 0.9 % IV SOLN
1500.0000 mg | Freq: Once | INTRAVENOUS | Status: AC
  Administered 2015-07-19: 1500 mg via INTRAVENOUS
  Filled 2015-07-19: qty 1500

## 2015-07-19 MED ORDER — PIPERACILLIN-TAZOBACTAM 3.375 G IVPB
3.3750 g | Freq: Once | INTRAVENOUS | Status: AC
  Administered 2015-07-19: 3.375 g via INTRAVENOUS
  Filled 2015-07-19: qty 50

## 2015-07-19 MED ORDER — SODIUM CHLORIDE 0.9 % IV SOLN
1500.0000 mg | INTRAVENOUS | Status: DC
  Administered 2015-07-20: 1500 mg via INTRAVENOUS
  Filled 2015-07-19: qty 1500

## 2015-07-19 MED ORDER — PIPERACILLIN-TAZOBACTAM 3.375 G IVPB
3.3750 g | Freq: Three times a day (TID) | INTRAVENOUS | Status: DC
  Administered 2015-07-19 – 2015-07-20 (×3): 3.375 g via INTRAVENOUS
  Filled 2015-07-19 (×5): qty 50

## 2015-07-19 MED ORDER — SODIUM CHLORIDE 0.9 % IV SOLN
INTRAVENOUS | Status: DC
  Administered 2015-07-19: 10:00:00 via INTRAVENOUS

## 2015-07-19 MED ORDER — IOHEXOL 300 MG/ML  SOLN
75.0000 mL | Freq: Once | INTRAMUSCULAR | Status: AC | PRN
  Administered 2015-07-19: 75 mL via INTRAVENOUS

## 2015-07-19 MED ORDER — FERROUS SULFATE 325 (65 FE) MG PO TABS
325.0000 mg | ORAL_TABLET | Freq: Every day | ORAL | Status: DC
  Administered 2015-07-20 – 2015-07-22 (×3): 325 mg via ORAL
  Filled 2015-07-19 (×3): qty 1

## 2015-07-19 MED ORDER — LEVOTHYROXINE SODIUM 125 MCG PO TABS
238.5000 ug | ORAL_TABLET | Freq: Every day | ORAL | Status: DC
  Administered 2015-07-20 – 2015-07-22 (×3): 238.5 ug via ORAL
  Filled 2015-07-19 (×4): qty 0.5

## 2015-07-19 MED ORDER — LEVALBUTEROL HCL 0.63 MG/3ML IN NEBU
0.6300 mg | INHALATION_SOLUTION | Freq: Four times a day (QID) | RESPIRATORY_TRACT | Status: DC | PRN
  Administered 2015-07-22: 0.63 mg via RESPIRATORY_TRACT
  Filled 2015-07-19: qty 3

## 2015-07-19 MED ORDER — DILTIAZEM HCL 25 MG/5ML IV SOLN
5.0000 mg | Freq: Once | INTRAVENOUS | Status: AC
  Administered 2015-07-19: 5 mg via INTRAVENOUS
  Filled 2015-07-19: qty 5

## 2015-07-19 MED ORDER — INSULIN ASPART 100 UNIT/ML ~~LOC~~ SOLN
0.0000 [IU] | Freq: Three times a day (TID) | SUBCUTANEOUS | Status: DC
  Administered 2015-07-21 – 2015-07-22 (×2): 1 [IU] via SUBCUTANEOUS

## 2015-07-19 MED ORDER — SODIUM CHLORIDE 0.9 % IV SOLN
INTRAVENOUS | Status: DC
  Administered 2015-07-19 – 2015-07-20 (×2): via INTRAVENOUS

## 2015-07-19 MED ORDER — OSELTAMIVIR PHOSPHATE 30 MG PO CAPS
30.0000 mg | ORAL_CAPSULE | Freq: Two times a day (BID) | ORAL | Status: DC
  Administered 2015-07-20 – 2015-07-22 (×6): 30 mg via ORAL
  Filled 2015-07-19 (×6): qty 1

## 2015-07-19 MED ORDER — DILTIAZEM HCL ER BEADS 240 MG PO CP24
360.0000 mg | ORAL_CAPSULE | Freq: Every day | ORAL | Status: DC
  Administered 2015-07-20 – 2015-07-22 (×3): 360 mg via ORAL
  Filled 2015-07-19 (×3): qty 1

## 2015-07-19 MED ORDER — ACETAMINOPHEN 650 MG RE SUPP: 650.0000 mg | Freq: Four times a day (QID) | RECTAL | Status: DC | PRN

## 2015-07-19 MED ORDER — SENNOSIDES-DOCUSATE SODIUM 8.6-50 MG PO TABS
1.0000 | ORAL_TABLET | Freq: Every evening | ORAL | Status: DC | PRN
  Administered 2015-07-20 – 2015-07-21 (×2): 1 via ORAL
  Filled 2015-07-19 (×2): qty 1

## 2015-07-19 MED ORDER — SODIUM CHLORIDE 0.9 % IJ SOLN
3.0000 mL | Freq: Two times a day (BID) | INTRAMUSCULAR | Status: DC
  Administered 2015-07-19 – 2015-07-21 (×3): 3 mL via INTRAVENOUS

## 2015-07-19 NOTE — ED Notes (Signed)
I attempted to collect labs and was unsuccessful. 

## 2015-07-19 NOTE — Progress Notes (Signed)
ANTIBIOTIC CONSULT NOTE - INITIAL  Pharmacy Consult for Vancomycin & Zosyn Indication: pneumonia  Allergies  Allergen Reactions  . Codeine     unknown  . Morphine And Related     sick  . Statins     sick  . Zetia [Ezetimibe] Other (See Comments)    Side effect to strong     Patient Measurements:    Vital Signs: Temp: 97.6 F (36.4 C) (01/17 1156) Temp Source: Oral (01/17 1156) BP: 115/67 mmHg (01/17 1200) Pulse Rate: 83 (01/17 1215) Intake/Output from previous day:   Intake/Output from this shift:    Labs:  Recent Labs  07/19/15 1036  WBC 9.0  HGB 14.9  PLT 123*  CREATININE 0.88   CrCl cannot be calculated (Unknown ideal weight.). No results for input(s): VANCOTROUGH, VANCOPEAK, VANCORANDOM, GENTTROUGH, GENTPEAK, GENTRANDOM, TOBRATROUGH, TOBRAPEAK, TOBRARND, AMIKACINPEAK, AMIKACINTROU, AMIKACIN in the last 72 hours.   Microbiology: No results found for this or any previous visit (from the past 720 hour(s)).  Medical History: Past Medical History  Diagnosis Date  . Hypertension   . SBO (small bowel obstruction) ([place])   . Permanent atrial fibrillation ([facility])     a. coumadin d/c'd => Pradaxa in 05/2012  . Chronic diastolic heart failure ([place])     a. Echo 5/12: Mild LVH, EF 55-60%, mild AI, mild MR, severe LAE, mild RAE, PASP 31, small pericardial effusion  . Venous insufficiency     chronic LE edema  . HLD (hyperlipidemia)   . OSA (obstructive sleep apnea)   . Hypothyroidism   . Diastolic CHF, chronic ([place]) 11/11/2012    Class 2b-3 2 d echo 5/12 mild LVH EF 55-50%, MILD AI, MILD MR, SEVERE LAE, mild RAE, PASP 31, SMALL PERICRADIAL EFFUSION  . Hx of cervical cancer 11/11/2012  . S/P hysterectomy 11/11/2012  . Hx of bladder cancer 11/11/2012  . SBO (small bowel obstruction) ([place]) 08/25/2014  . Colon cancer ([place])     "polyp" (12/08/2012)  . Breast cancer ([place])   . Cervical cancer ([place])   . Ovarian cancer ([place])   . Bladder cancer ([place])   . H/O ovarian cancer  11/11/2012     MUCINOUS CYST S/P RESECTION 35 YEARS AGO  . DM2 (diabetes mellitus, type 2) ([facility])     TYPE 2    Medications:  Anti-infectives    Start     Dose/Rate Route Frequency Ordered Stop   07/19/15 1130  piperacillin-tazobactam (ZOSYN) IVPB 3.375 g     3.375 g 12.5 mL/hr over 240 Minutes Intravenous  Once 07/19/15 1126     07/19/15 1130  vancomycin (VANCOCIN) IVPB 1000 mg/200 mL premix     1,000 mg 200 mL/hr over 60 Minutes Intravenous  Once 07/19/15 1126       Assessment: 82 yo F admitted from nursing facility with shortness of breath and productive cough.    Tm 102.9 F  WBC 9  Scr 0.88; estimated CrCl ~ 45 (N)   CXR with possible RML PNA   Antimicrobials this admission: Vanc 1/17 >>  Zosyn 1/17 >>   Levels/dose changes this admission:   Microbiology Results: 1/17 BCx:  UCx:  RSV panel:  Goal of Therapy:  Vancomycin trough level 15-20 mcg/ml  Plan:  Zosyn 3.375gm IV Q8h to be infused over 4hrs Vancomycin 1500mg  IV q24h Check Vancomycin trough at steady state Monitor renal function and cx data   Biagio Borg 07/19/2015,12:52 PM

## 2015-07-19 NOTE — ED Notes (Signed)
After giving 5 mg cardizem IV HR decreased to 106, then elevated and has maintained in the range from 100-135

## 2015-07-19 NOTE — ED Notes (Signed)
Pt from [facility] [address], pt has had a productive cough with yellow sputum x 2 weeks per EMS. Staff states no fever/N/V/D, pt states she did have a fever earlier in the week. C/o chest soreness with coughing.

## 2015-07-19 NOTE — ED Notes (Signed)
PT CAN GO UP AT 16:15.

## 2015-07-19 NOTE — ED Provider Notes (Addendum)
CSN: [id number]     Arrival date & time 07/19/15  [id number] History   First MD Initiated Contact with Patient 07/19/15 [phone]     Chief Complaint  Patient presents with  . Shortness of Breath  . Cough  . Nausea     (Consider location/radiation/quality/duration/timing/severity/associated sxs/prior Treatment) HPI Comments: Patient here complaining of several days of worsening shortness of breath. Did have some vomiting yesterday which is since subsided. Endorses myalgias. Denies any urinary symptoms. Source of breath this worse with lying flat. Has had a productive cough without associated diarrhea. Also endorses increased lower extremity swelling. States compliance with her medications. EMS was called and patient transported here  Patient is a 82 y.o. female presenting with shortness of breath and cough. The history is provided by the patient.  Shortness of Breath Associated symptoms: cough   Cough Associated symptoms: shortness of breath     Past Medical History  Diagnosis Date  . Hypertension   . SBO (small bowel obstruction) ([place])   . Permanent atrial fibrillation (HCC)     a. coumadin d/c'd => Pradaxa in 05/2012  . Chronic diastolic heart failure ([place])     a. Echo 5/12: Mild LVH, EF 55-60%, mild AI, mild MR, severe LAE, mild RAE, PASP 31, small pericardial effusion  . Venous insufficiency     chronic LE edema  . HLD (hyperlipidemia)   . OSA (obstructive sleep apnea)   . Hypothyroidism   . Diastolic CHF, chronic ([place]) 11/11/2012    Class 2b-3 2 d echo 5/12 mild LVH EF 55-50%, MILD AI, MILD MR, SEVERE LAE, mild RAE, PASP 31, SMALL PERICRADIAL EFFUSION  . Hx of cervical cancer 11/11/2012  . S/P hysterectomy 11/11/2012  . Colon cancer ([place])     "polyp" (12/08/2012)  . Breast cancer ([place])   . Cervical cancer ([place])   . Ovarian cancer ([place])   . Bladder cancer ([place])   . H/O ovarian cancer 11/11/2012     MUCINOUS CYST S/P RESECTION 35 YEARS AGO  . Hx of bladder cancer 11/11/2012  . SBO  (small bowel obstruction) ([place]) 08/25/2014  . DM2 (diabetes mellitus, type 2) (HCC)     TYPE 2   Past Surgical History  Procedure Laterality Date  . Bladder surgery    . Appendectomy    . Vaginal hysterectomy    . Exploratory laparotomy with abdominal mass excision      "21# ovarian tumor; benign" (12/08/2012)  . Cataract extraction w/ intraocular lens  implant, bilateral    . Mastectomy, radical Left   . Breast lumpectomy Right   . Breast biopsy Bilateral   . I&d extremity Right 12/31/2012    Procedure: IRRIGATION AND DEBRIDEMENT RIGHT KNEE ULCER WITH PLACEMENT OF A CELL AND VAC ;  Surgeon: Theodoro Kos, DO;  Location: [facility];  Service: Plastics;  Laterality: Right;   History reviewed. No pertinent family history. Social History  Substance Use Topics  . Smoking status: Former Smoker -- 3.00 packs/day for 25 years    Types: Cigarettes    Quit date: 07/02/1966  . Smokeless tobacco: Never Used  . Alcohol Use: No   OB History    No data available     Review of Systems  Respiratory: Positive for cough and shortness of breath.   All other systems reviewed and are negative.     Allergies  Codeine; Morphine and related; Statins; and Zetia  Home Medications   Prior to Admission medications   Medication Sig Start Date End  Date Taking? Authorizing Provider  apixaban (ELIQUIS) 5 MG TABS tablet Take 1 tablet (5 mg total) by mouth 2 (two) times daily. 01/18/14   Thompson Grayer, MD  bisacodyl (DULCOLAX) 10 MG suppository Place 1 suppository (10 mg total) rectally daily as needed for moderate constipation. 08/31/14   Geradine Girt, DO  bisacodyl (DULCOLAX) 5 MG EC tablet Take 5 mg by mouth daily.     Historical Provider, MD  Cyanocobalamin (VITAMIN B-12 IJ) Inject 1 mL as directed every 30 (thirty) days.     Historical Provider, MD  diltiazem (TIAZAC) 360 MG 24 hr capsule Take 360 mg by mouth daily.    Historical Provider, MD  ferrous sulfate 325 (65 FE) MG tablet Take 325 mg by mouth  daily with breakfast.    Historical Provider, MD  furosemide (LASIX) 80 MG tablet TK 1 T PO QD IN THE MORNING 11/14/14   Historical Provider, MD  levothyroxine (SYNTHROID, LEVOTHROID) 125 MCG tablet Take 2 tablets (250 mcg total) by mouth daily before breakfast. 08/31/14   Geradine Girt, DO  potassium chloride SA (K-DUR,KLOR-CON) 20 MEQ tablet Take 20 mEq by mouth daily.    Historical Provider, MD  ramipril (ALTACE) 2.5 MG capsule Take 2.5 mg by mouth daily.    Historical Provider, MD  senna-docusate (SENOKOT-S) 8.6-50 MG per tablet Take 1 tablet by mouth 2 (two) times daily. 08/31/14   Geradine Girt, DO  sodium phosphate (FLEET) 7-19 GM/118ML ENEM Place 133 mLs (1 enema total) rectally daily as needed for severe constipation. 08/31/14   Jessica U Vann, DO   BP 143/83 mmHg  Pulse 155  Temp(Src) 98.9 F (37.2 C) (Oral)  Resp 16  SpO2 93% Physical Exam  Constitutional: She is oriented to person, place, and time. She appears well-developed and well-nourished.  Non-toxic appearance. No distress.  HENT:  Head: Normocephalic and atraumatic.  Eyes: Conjunctivae, EOM and lids are normal. Pupils are equal, round, and reactive to light.  Neck: Normal range of motion. Neck supple. No tracheal deviation present. No thyroid mass present.  Cardiovascular: Normal heart sounds.  An irregularly irregular rhythm present. Tachycardia present.  Exam reveals no gallop.   No murmur heard. Pulmonary/Chest: Effort normal. No stridor. No respiratory distress. She has decreased breath sounds. She has no wheezes. She has rhonchi. She has no rales.  Abdominal: Soft. Normal appearance and bowel sounds are normal. She exhibits no distension. There is no tenderness. There is no rebound and no CVA tenderness.  Musculoskeletal: Normal range of motion. She exhibits no edema or tenderness.  2+ bilateral lower extremity edema  Neurological: She is alert and oriented to person, place, and time. She has normal strength. No cranial  nerve deficit or sensory deficit. GCS eye subscore is 4. GCS verbal subscore is 5. GCS motor subscore is 6.  Skin: Skin is warm and dry. No abrasion and no rash noted.  Psychiatric: She has a normal mood and affect. Her speech is normal and behavior is normal.  Nursing note and vitals reviewed.   ED Course  Procedures (including critical care time) Labs Review Labs Reviewed  URINE CULTURE  CULTURE, BLOOD (ROUTINE X 2)  CULTURE, BLOOD (ROUTINE X 2)  CBC WITH DIFFERENTIAL/PLATELET  BASIC METABOLIC PANEL  URINALYSIS, ROUTINE W REFLEX MICROSCOPIC (NOT AT [facility])  BRAIN NATRIURETIC PEPTIDE  I-STAT CG4 LACTIC ACID, ED  I-STAT TROPOININ, ED    Imaging Review No results found. I have personally reviewed and evaluated these images and lab results as  part of my medical decision-making.   EKG Interpretation   Date/Time:  Tuesday July 19 2015 10:00:32 EST Ventricular Rate:  148 PR Interval:    QRS Duration: 89 QT Interval:  270 QTC Calculation: 424 R Axis:   61 Text Interpretation:  Atrial fibrillation with rapid V-rate Low voltage,  extremity leads Repolarization abnormality, prob rate related rate is  increased from prior Confirmed by Karla Vines  MD, Kadence Mikkelson ([id number]) on 07/19/2015  10:12:59 AM      MDM   Final diagnoses:  Cough  CRITICAL CARE Performed by: Leota Jacobsen Total critical care time: 60 minutes Critical care time was exclusive of separately billable procedures and treating other patients. Critical care was necessary to treat or prevent imminent or life-threatening deterioration. Critical care was time spent personally by me on the following activities: development of treatment plan with patient and/or surrogate as well as nursing, discussions with consultants, evaluation of patient's response to treatment, examination of patient, obtaining history from patient or surrogate, ordering and performing treatments and interventions, ordering and review of laboratory  studies, ordering and review of radiographic studies, pulse oximetry and re-evaluation of patient's condition.   Patient given Tylenol for her increased temperature as well as Cardizem for her atrial fibrillation with rapid ventricular rate response. Heart rate did decrease slightly but she remains in rapid ventricular rate response. Will give her an additional dose of IV Cardizem. Started on IV antibiotics for pneumonia. Will admit to stepdown. Spoke with family    Lacretia Leigh, MD 07/19/15 1146  Lacretia Leigh, MD 07/19/15 1146

## 2015-07-19 NOTE — ED Notes (Signed)
Bed: WA02 Expected date:  Expected time:  Means of arrival:  Comments: EMS- 82yo F, productive cough/87% RA

## 2015-07-19 NOTE — ED Notes (Signed)
Critical I stat result given to EDP Zenia Resides

## 2015-07-19 NOTE — H&P (Addendum)
[facility] History and Physical  JAKAIYA RIGGLES [id number] DOB: Nov 29, 1919 DOA: 07/19/2015  Referring physician:  PCP: [name], MD   Chief Complaint: Shortness of breath   HPI:  82 year old female with a history of 16 bowel obstructions in the past 5 years, and diastolic heart failure, A. fib, ovarian cancer, bladder cancer, and obstructive sleep apnea on Cpap, presents to the ER with sinus congestion, chest congestion, nonproductive cough, low-grade fever, worsening shortness of breath. Found to have a fever of 102.9 in the ER. Patient also somewhat hypoxic requiring 2 L of oxygen, not on any oxygen at baseline,. Presents from assisted living [place] [facility] [address], pt has had a productive cough with yellow sputum x 2 weeks , worse since 2 days ago. Apparently there has been some benefit outbreak of viral URI at her nursing home. In the ER patient was found to have atrial fibrillation with RVR , heart rate into the 120s, she has received 2 doses of IV Cardizem so far, chest x-ray shows  RML pneumonia. I-STAT troponin 0.38, patient denies any chest pain. No signs of CHF exacerbation,     Review of Systems: negative for the following  Constitutional: Denies fever, chills, diaphoresis, appetite change and fatigue.  HEENT: Denies photophobia, eye pain, redness, hearing loss, ear pain, congestion, sore throat, rhinorrhea, sneezing, mouth sores, trouble swallowing, neck pain, neck stiffness and tinnitus.  Respiratory: Denies SOB, DOE, cough, chest tightness, and wheezing.  Respiratory: Positive for cough and shortness of breath.Gastrointestinal: Denies nausea, vomiting, abdominal pain, diarrhea, constipation, blood in stool and abdominal distention.  Genitourinary: Denies dysuria, urgency, frequency, hematuria, flank pain and difficulty urinating.  Musculoskeletal: Denies myalgias, back pain, joint swelling, arthralgias and gait problem.  Skin: Denies pallor, rash and wound.   Neurological: Denies dizziness, seizures, syncope, weakness, light-headedness, numbness and headaches.  Hematological: Denies adenopathy. Easy bruising, personal or family bleeding history  Psychiatric/Behavioral: Denies suicidal ideation, mood changes, confusion, nervousness, sleep disturbance and agitation       Past Medical History  Diagnosis Date  . Hypertension   . SBO (small bowel obstruction) ([place])   . Permanent atrial fibrillation (HCC)     a. coumadin d/c'd => Pradaxa in 05/2012  . Chronic diastolic heart failure ([place])     a. Echo 5/12: Mild LVH, EF 55-60%, mild AI, mild MR, severe LAE, mild RAE, PASP 31, small pericardial effusion  . Venous insufficiency     chronic LE edema  . HLD (hyperlipidemia)   . OSA (obstructive sleep apnea)   . Hypothyroidism   . Diastolic CHF, chronic ([place]) 11/11/2012    Class 2b-3 2 d echo 5/12 mild LVH EF 55-50%, MILD AI, MILD MR, SEVERE LAE, mild RAE, PASP 31, SMALL PERICRADIAL EFFUSION  . Hx of cervical cancer 11/11/2012  . S/P hysterectomy 11/11/2012  . Hx of bladder cancer 11/11/2012  . SBO (small bowel obstruction) ([place]) 08/25/2014  . Colon cancer ([place])     "polyp" (12/08/2012)  . Breast cancer ([place])   . Cervical cancer ([place])   . Ovarian cancer ([place])   . Bladder cancer ([place])   . H/O ovarian cancer 11/11/2012     MUCINOUS CYST S/P RESECTION 35 YEARS AGO  . DM2 (diabetes mellitus, type 2) (HCC)     TYPE 2     Past Surgical History  Procedure Laterality Date  . Bladder surgery    . Appendectomy    . Vaginal hysterectomy    . Exploratory laparotomy with abdominal mass excision      "  21# ovarian tumor; benign" (12/08/2012)  . Cataract extraction w/ intraocular lens  implant, bilateral    . Mastectomy, radical Left   . Breast lumpectomy Right   . Breast biopsy Bilateral   . I&d extremity Right 12/31/2012    Procedure: IRRIGATION AND DEBRIDEMENT RIGHT KNEE ULCER WITH PLACEMENT OF A CELL AND VAC ;  Surgeon: Theodoro Kos, DO;  Location: [facility];  Service: Plastics;  Laterality: Right;      Social History:  reports that she quit smoking about 49 years ago. Her smoking use included Cigarettes. She has a 75 pack-year smoking history. She has never used smokeless tobacco. She reports that she does not drink alcohol or use illicit drugs.    Allergies  Allergen Reactions  . Codeine     unknown  . Morphine And Related     sick  . Statins     sick  . Zetia [Ezetimibe] Other (See Comments)    Side effect to strong         Family History the patient herself is a had a history of malignant polyps. There is no known history of colon cancer  Prior to Admission medications   Medication Sig Start Date End Date Taking? Authorizing Provider  apixaban (ELIQUIS) 5 MG TABS tablet Take 1 tablet (5 mg total) by mouth 2 (two) times daily. 01/18/14  Yes Thompson Grayer, MD  bisacodyl (DULCOLAX) 10 MG suppository Place 1 suppository (10 mg total) rectally daily as needed for moderate constipation. 08/31/14  Yes Geradine Girt, DO  bisacodyl (DULCOLAX) 5 MG EC tablet Take 5 mg by mouth daily.    Yes Historical Provider, MD  Cyanocobalamin (VITAMIN B-12 IJ) Inject 1 mL as directed every 30 (thirty) days.    Yes Historical Provider, MD  diltiazem (TIAZAC) 360 MG 24 hr capsule Take 360 mg by mouth daily.   Yes Historical Provider, MD  ferrous sulfate 325 (65 FE) MG tablet Take 325 mg by mouth daily with breakfast.   Yes Historical Provider, MD  furosemide (LASIX) 40 MG tablet Take 120 mg by mouth.   Yes Historical Provider, MD  furosemide (LASIX) 80 MG tablet Take 40 mg by mouth daily as needed for fluid (swelling).   Yes Historical Provider, MD  levothyroxine (SYNTHROID, LEVOTHROID) 200 MCG tablet Take 200 mcg by mouth daily before breakfast.   Yes Historical Provider, MD  lisinopril (PRINIVIL,ZESTRIL) 5 MG tablet Take 5 mg by mouth daily.   Yes Historical Provider, MD  potassium chloride SA (K-DUR,KLOR-CON) 20 MEQ tablet Take 20 mEq by mouth  daily.   Yes Historical Provider, MD  senna-docusate (SENOKOT-S) 8.6-50 MG per tablet Take 1 tablet by mouth 2 (two) times daily. 08/31/14  Yes Geradine Girt, DO  sodium phosphate (FLEET) 7-19 GM/118ML ENEM Place 133 mLs (1 enema total) rectally daily as needed for severe constipation. 08/31/14  Yes Geradine Girt, DO  levothyroxine (SYNTHROID, LEVOTHROID) 125 MCG tablet Take 2 tablets (250 mcg total) by mouth daily before breakfast. Patient not taking: Reported on 07/19/2015 08/31/14   Geradine Girt, DO     Physical Exam: Filed Vitals:   07/19/15 1042 07/19/15 1156 07/19/15 1200 07/19/15 1215  BP: 149/81 128/79 115/67   Pulse: 109 120  83  Temp: 102.9 F (39.4 C) 97.6 F (36.4 C)    TempSrc: Rectal Oral    Resp: 20 24 28 24   SpO2: 93% 100%  96%     Constitutional: Vital signs reviewed. Patient is a  well-developed and well-nourished in no acute distress and cooperative with exam. Alert and oriented x3.  Head: Normocephalic and atraumatic  Ear: TM normal bilaterally  Mouth: no erythema or exudates, MMM  Eyes: PERRL, EOMI, conjunctivae normal, No scleral icterus.  Neck: Supple, Trachea midline normal ROM, No JVD, mass, thyromegaly, or carotid bruit present.  Cardiovascular: RRR, S1 normal, S2 normal, no MRG, pulses symmetric and intact bilaterally  Pulmonary/Chest: CTAB, no wheezes, rales, or rhonchi  Abdominal: Soft. Non-tender, non-distended, bowel sounds are normal, no masses, organomegaly, or guarding present.  GU: no CVA tenderness Musculoskeletal: No joint deformities, erythema, or stiffness, ROM full and no nontender Ext: no edema and no cyanosis, pulses palpable bilaterally (DP and PT)  Hematology: no cervical, inginal, or axillary adenopathy.  Neurological: A&O x3, Strenght is normal and symmetric bilaterally, cranial nerve II-XII are grossly intact, no focal motor deficit, sensory intact to light touch bilaterally.  Skin: Warm, dry and intact. No rash, cyanosis, or clubbing.   Psychiatric: Normal mood and affect. speech and behavior is normal. Judgment and thought content normal. Cognition and memory are normal.      Data Review   Micro Results No results found for this or any previous visit (from the past 240 hour(s)).  Radiology Reports Dg Chest 2 View  07/19/2015  CLINICAL DATA:  Productive cough with yellow sputum for 2 weeks. EXAM: CHEST  2 VIEW COMPARISON:  Multiple x-rays since June 2015 FINDINGS: There is a rounded opacity in the right middle lobe seen on frontal and lateral views. The finding has become more conspicuous over time. An infrahilar opacity was described in this region on the June 2015 study. No other focal infiltrates are identified. No pneumothorax. The right heart border is obscured. The cardiomediastinal silhouette is otherwise unchanged. A sclerotic lesion in the left humerus is unchanged since February of 2016. IMPRESSION: 1. There is a rounded masslike opacity in the right middle lobe which has slowly progressed since first described in June of 2015. A CT scan could further evaluate. No other interval changes. Electronically Signed   By: Dorise Bullion III M.D   On: 07/19/2015 11:33     CBC  Recent Labs Lab 07/19/15 1036  WBC 9.0  HGB 14.9  HCT 45.0  PLT 123*  MCV 89.3  MCH 29.6  MCHC 33.1  RDW 15.0  LYMPHSABS 0.8  MONOABS 0.8  EOSABS 0.0  BASOSABS 0.0    Chemistries   Recent Labs Lab 07/19/15 1036  NA 140  K 3.7  CL 104  CO2 25  GLUCOSE 140*  BUN 20  CREATININE 0.88  CALCIUM 8.9   ------------------------------------------------------------------------------------------------------------------ CrCl cannot be calculated (Unknown ideal weight.). ------------------------------------------------------------------------------------------------------------------ No results for input(s): HGBA1C in the last 72  hours. ------------------------------------------------------------------------------------------------------------------ No results for input(s): CHOL, HDL, LDLCALC, TRIG, CHOLHDL, LDLDIRECT in the last 72 hours. ------------------------------------------------------------------------------------------------------------------ No results for input(s): TSH, T4TOTAL, T3FREE, THYROIDAB in the last 72 hours.  Invalid input(s): FREET3 ------------------------------------------------------------------------------------------------------------------ No results for input(s): VITAMINB12, FOLATE, FERRITIN, TIBC, IRON, RETICCTPCT in the last 72 hours.  Coagulation profile No results for input(s): INR, PROTIME in the last 168 hours.  No results for input(s): DDIMER in the last 72 hours.  Cardiac Enzymes No results for input(s): CKMB, TROPONINI, MYOGLOBIN in the last 168 hours.  Invalid input(s): CK ------------------------------------------------------------------------------------------------------------------ Invalid input(s): POCBNP   CBG: No results for input(s): GLUCAP in the last 168 hours.     EKG: Independently reviewed.  EKG Interpretation   Date/Time: Tuesday July 19 2015 10:00:32  EST Ventricular Rate: 148 PR Interval:  QRS Duration: 89 QT Interval: 270 QTC Calculation: 424 R Axis: 61 Text Interpretation: Atrial fibrillation with rapid V-rate Low voltage,  extremity leads Repolarization abnormality   Assessment/Plan Active Problems:   Chronic diastolic heart failure (HCC)   Atrial fibrillation (HCC)   Diabetes mellitus (HCC)   Hypothyroidism   Venous insufficiency   HCAP (healthcare-associated pneumonia)   HCAP Patient from assisted living, chest x-ray suspicious for right middle lobe pneumonia CT to further evaluate masslike opacity, slowly progressed since June 2015 Check influenza PCR, strep pneumo urine antigen, Legionella urine antigen Started  on Zosyn/vancomycin, follow blood culture   Chronic  Diastolic heart failure Patient is currently dehydrated but does have some minimal swelling in her lower extremities. Will give IV fluids  And monitor volume status closely. Last echo 6/15, LVEF 50-55%.  Dehydration We'll give gentle IV fluids and monitor creatinine. Currently creatinine is at baseline  A. fib with RVR Received 2 doses of IV Cardizem injection in the ER. Treat with  IV metoprolol Q6 hours for heart rate greater than 120. Continue Eliquis , resume PO Cardizem   Hyperglycemia Sliding scale insulin sensitive with every 6  hours CBGs.   Abnormal troponin Repeat  serum troponin, cycle cardiac enzymes now trending up, maintained on telemetry, likely demand ischemia from atrial fibrillation with rapid ventricular response in the setting of pneumonia and fever, 2-D echo to rule out wall motion abnormalities, discussed with Dr Percival Spanish on the phone , he does not feel ischemia work up is indicated unless enzymes continue to climb or active chest pain    Hypothyroidism Continue Synthroid.  Obstructive sleep apnea C Pap daily at bedtime.    Code Status:   full Family Communication: bedside Disposition Plan: admit   Total time spent 55 minutes.Greater than 50% of this time was spent in counseling, explanation of diagnosis, planning of further management, and coordination of care  [facility] Pager [phone]  If 7PM-7AM, please contact night-coverage [email or web address] Password [facility] 07/19/2015, 12:44 PM

## 2015-07-20 ENCOUNTER — Inpatient Hospital Stay (HOSPITAL_COMMUNITY): Payer: Medicare Other

## 2015-07-20 DIAGNOSIS — R0600 Dyspnea, unspecified: Secondary | ICD-10-CM

## 2015-07-20 DIAGNOSIS — J101 Influenza due to other identified influenza virus with other respiratory manifestations: Secondary | ICD-10-CM | POA: Diagnosis present

## 2015-07-20 LAB — COMPREHENSIVE METABOLIC PANEL
ALT: 11 U/L — AB (ref 14–54)
AST: 20 U/L (ref 15–41)
Albumin: 3.1 g/dL — ABNORMAL LOW (ref 3.5–5.0)
Alkaline Phosphatase: 45 U/L (ref 38–126)
Anion gap: 8 (ref 5–15)
BUN: 16 mg/dL (ref 6–20)
CHLORIDE: 107 mmol/L (ref 101–111)
CO2: 27 mmol/L (ref 22–32)
CREATININE: 0.62 mg/dL (ref 0.44–1.00)
Calcium: 8.1 mg/dL — ABNORMAL LOW (ref 8.9–10.3)
GFR calc non Af Amer: >60 mL/min (ref >60)
Glucose, Bld: 79 mg/dL (ref 65–99)
POTASSIUM: 3.5 mmol/L (ref 3.5–5.1)
Sodium: 142 mmol/L (ref 135–145)
Total Bilirubin: 1.1 mg/dL (ref 0.3–1.2)
Total Protein: 5.4 g/dL — ABNORMAL LOW (ref 6.5–8.1)

## 2015-07-20 LAB — URINE CULTURE

## 2015-07-20 LAB — CBC
HEMATOCRIT: 41.2 % (ref 36.0–46.0)
Hemoglobin: 13 g/dL (ref 12.0–15.0)
MCH: 29.2 pg (ref 26.0–34.0)
MCHC: 31.6 g/dL (ref 30.0–36.0)
MCV: 92.6 fL (ref 78.0–100.0)
PLATELETS: 107 10*3/uL — AB (ref 150–400)
RBC: 4.45 MIL/uL (ref 3.87–5.11)
RDW: 15.3 % (ref 11.5–15.5)
WBC: 4.5 10*3/uL (ref 4.0–10.5)

## 2015-07-20 LAB — HEMOGLOBIN A1C
Hgb A1c MFr Bld: 5.7 % — ABNORMAL HIGH (ref 4.8–5.6)
MEAN PLASMA GLUCOSE: 117 mg/dL

## 2015-07-20 LAB — TROPONIN I
Troponin I: 0.31 ng/mL — ABNORMAL HIGH (ref <0.031)
Troponin I: 0.25 ng/mL — ABNORMAL HIGH (ref <0.031)

## 2015-07-20 LAB — LEGIONELLA ANTIGEN, URINE

## 2015-07-20 LAB — RESPIRATORY VIRUS PANEL
Adenovirus: NEGATIVE
INFLUENZA A: NEGATIVE
Influenza B: NEGATIVE
METAPNEUMOVIRUS: NEGATIVE
PARAINFLUENZA 2 A: NEGATIVE
PARAINFLUENZA 3 A: NEGATIVE
Parainfluenza 1: NEGATIVE
Respiratory Syncytial Virus A: NEGATIVE
Respiratory Syncytial Virus B: NEGATIVE
Rhinovirus: NEGATIVE

## 2015-07-20 LAB — GLUCOSE, CAPILLARY
GLUCOSE-CAPILLARY: 102 mg/dL — AB (ref 65–99)
Glucose-Capillary: 82 mg/dL (ref 65–99)
Glucose-Capillary: 123 mg/dL — ABNORMAL HIGH (ref 65–99)
Glucose-Capillary: 88 mg/dL (ref 65–99)

## 2015-07-20 LAB — MRSA PCR SCREENING: MRSA by PCR: POSITIVE — AB

## 2015-07-20 MED ORDER — CHLORHEXIDINE GLUCONATE CLOTH 2 % EX PADS
6.0000 | MEDICATED_PAD | Freq: Every day | CUTANEOUS | Status: DC
  Administered 2015-07-21 – 2015-07-22 (×2): 6 via TOPICAL

## 2015-07-20 MED ORDER — MUPIROCIN 2 % EX OINT
1.0000 "application " | TOPICAL_OINTMENT | Freq: Two times a day (BID) | CUTANEOUS | Status: DC
  Administered 2015-07-20 – 2015-07-22 (×4): 1 via NASAL
  Filled 2015-07-20: qty 22

## 2015-07-20 MED ORDER — LEVOFLOXACIN 750 MG PO TABS
750.0000 mg | ORAL_TABLET | ORAL | Status: DC
  Administered 2015-07-20: 750 mg via ORAL
  Filled 2015-07-20: qty 1

## 2015-07-20 MED ORDER — POTASSIUM CHLORIDE 20 MEQ/15ML (10%) PO SOLN
40.0000 meq | Freq: Once | ORAL | Status: AC
  Administered 2015-07-20: 40 meq via ORAL
  Filled 2015-07-20: qty 30

## 2015-07-20 NOTE — Progress Notes (Signed)
  Echocardiogram 2D Echocardiogram has been performed.  [name] 07/20/2015, 1:01 PM

## 2015-07-20 NOTE — Evaluation (Signed)
Clinical/Bedside Swallow Evaluation Patient Details  Name: [name] MRN: [id number] Date of Birth: [birth year]  Today's Date: 07/20/2015 Time: SLP Start Time (ACUTE ONLY): 1100 SLP Stop Time (ACUTE ONLY): 1120 SLP Time Calculation (min) (ACUTE ONLY): 20 min  Past Medical History:  Past Medical History  Diagnosis Date  . Hypertension   . SBO (small bowel obstruction) ([place])   . Permanent atrial fibrillation (HCC)     a. coumadin d/c'd => Pradaxa in 05/2012  . Chronic diastolic heart failure ([place])     a. Echo 5/12: Mild LVH, EF 55-60%, mild AI, mild MR, severe LAE, mild RAE, PASP 31, small pericardial effusion  . Venous insufficiency     chronic LE edema  . HLD (hyperlipidemia)   . OSA (obstructive sleep apnea)   . Hypothyroidism   . Diastolic CHF, chronic ([place]) 11/11/2012    Class 2b-3 2 d echo 5/12 mild LVH EF 55-50%, MILD AI, MILD MR, SEVERE LAE, mild RAE, PASP 31, SMALL PERICRADIAL EFFUSION  . Hx of cervical cancer 11/11/2012  . S/P hysterectomy 11/11/2012  . Hx of bladder cancer 11/11/2012  . SBO (small bowel obstruction) ([place]) 08/25/2014  . Colon cancer ([place])     "polyp" (12/08/2012)  . Breast cancer ([place])   . Cervical cancer ([place])   . Ovarian cancer ([place])   . Bladder cancer ([place])   . H/O ovarian cancer 11/11/2012     MUCINOUS CYST S/P RESECTION 35 YEARS AGO  . DM2 (diabetes mellitus, type 2) ([place])     TYPE 2   Past Surgical History:  Past Surgical History  Procedure Laterality Date  . Bladder surgery    . Appendectomy    . Vaginal hysterectomy    . Exploratory laparotomy with abdominal mass excision      "21# ovarian tumor; benign" (12/08/2012)  . Cataract extraction w/ intraocular lens  implant, bilateral    . Mastectomy, radical Left   . Breast lumpectomy Right   . Breast biopsy Bilateral   . I&d extremity Right 12/31/2012    Procedure: IRRIGATION AND DEBRIDEMENT RIGHT KNEE ULCER WITH PLACEMENT OF A CELL AND VAC ;  Surgeon: Theodoro Kos, DO;  Location: [facility];   Service: Plastics;  Laterality: Right;   HPI:  82 year old female with a history of 16 bowel obstructions in the past 5 years, and diastolic heart failure, A. fib, ovarian cancer, bladder cancer, and obstructive sleep apnea on Cpap, presents to the ER with sinus congestion, chest congestion, nonproductive cough, low-grade fever, worsening shortness of breath. Found to have a fever of 102.9 in the ER. Patient also somewhat hypoxic requiring 2 L of oxygen, not on any oxygen at baseline,. Presents from assisted living [facility] on [address], pt has had a productive cough with yellow sputum x 2 weeks , worse since 2 days ago. Apparently there has been some benefit outbreak of viral URI at her nursing home. In the ER patient was found to have atrial fibrillation with RVR - chest x-ray shows RML pneumonia per MD notes.  No signs of CHF exacerbation per MD note,    Assessment / Plan / Recommendation Clinical Impression  Pt presents with functional oropharyngeal swallow ability.  Baseline cough noted with and without po intake - likely.   Pt admits to premorbid issues swallowing pills "forever"  requiring her to take them with coffee.   She also admits to sore throat recently.    Swallow was overall timely with no indication of residuals.  Mastication abilities negatively  impacted by lack of pt's partials.  Pt states she will have family bring in her partials from her facility.  Recommend continue regular/thin diet = no slp follow up indicated.     Aspiration Risk  Mild aspiration risk    Diet Recommendation Regular;Thin liquid   Liquid Administration via: Cup;Straw Medication Administration: Whole meds with liquid Supervision: Patient able to self feed Compensations: Small sips/bites;Slow rate    Other  Recommendations Oral Care Recommendations: Oral care BID   Follow up Recommendations  None    Frequency and Duration     n/a       Prognosis Prognosis for Safe Diet Advancement: Guarded       Swallow Study   General Date of Onset: 07/20/15 HPI: 82 year old female with a history of 16 bowel obstructions in the past 5 years, and diastolic heart failure, A. fib, ovarian cancer, bladder cancer, and obstructive sleep apnea on Cpap, presents to the ER with sinus congestion, chest congestion, nonproductive cough, low-grade fever, worsening shortness of breath. Found to have a fever of 102.9 in the ER. Patient also somewhat hypoxic requiring 2 L of oxygen, not on any oxygen at baseline,. Presents from assisted living [facility] on [address], pt has had a productive cough with yellow sputum x 2 weeks , worse since 2 days ago. Apparently there has been some benefit outbreak of viral URI at her nursing home. In the ER patient was found to have atrial fibrillation with RVR - chest x-ray shows RML pneumonia per MD notes.  No signs of CHF exacerbation per MD note,  Type of Study: Bedside Swallow Evaluation Diet Prior to this Study: Regular;Thin liquids Temperature Spikes Noted: Yes Respiratory Status: Nasal cannula History of Recent Intubation: No Behavior/Cognition: Alert;Cooperative;Pleasant mood Oral Cavity Assessment: Within Functional Limits Oral Care Completed by SLP: No Oral Cavity - Dentition: Missing dentition (pt reports she has a partial at her facility) Vision: Functional for self-feeding Self-Feeding Abilities: Able to feed self Patient Positioning: Upright in bed Baseline Vocal Quality: Hoarse Volitional Cough: Strong Volitional Swallow: Able to elicit    Oral/Motor/Sensory Function Overall Oral Motor/Sensory Function: Within functional limits   Ice Chips Ice chips: Not tested   Thin Liquid Thin Liquid: Within functional limits Presentation: Straw    Nectar Thick Nectar Thick Liquid: Not tested   Honey Thick Honey Thick Liquid: Not tested   Puree Puree: Within functional limits Presentation: Self Fed;Spoon   Solid   GO   Solid: Within functional limits Presentation:  [place], [place] [facility] SLP [phone]  07/20/2015,11:24 AM

## 2015-07-20 NOTE — Progress Notes (Signed)
[facility] PROGRESS NOTE  KANOSHA BOERST [id number] DOB: 08/05/1919 DOA: 07/19/2015 PCP: [name], MD  Summary 07/20/15: I have seen and examined [name] at bedside and reviewed her chart. [name] is a pleasant 82 year old female SNF resident with a history of 16 bowel obstructions in the past 5 years, and chronic diastolic heart failure, chronic A. fib, ovarian cancer, bladder cancer, and obstructive sleep apnea on Cpap, who presented to the ER with sinus congestion, chest congestion, nonproductive cough, low-grade fever, worsening shortness of breath and she is found to have influenza type B 8. There is concern for HCAP although all her symptoms can be explained by the Flu.  Will therefore  de-escalate antibiotics to Levaquin and continue Tamiflu to complete 5 days. Positive troponins likely related to influenza. Patient states that she still feels lousy.  Plan HCAP (healthcare-associated pneumonia)/Type A influenza  Discontinue vancomycin/Zosyn  Levaquin  Day 2 Tamiflu Chronic diastolic heart failure (HCC)/Atrial fibrillation (HCC)/Diabetes mellitus (HCC)/Hypothyroidism/Venous insufficiency  No acute changes  Continue current management   Code Status: Full Code Family Communication: None at bedside Disposition Plan: Back to SNF eventually   Consultants:  None  Procedures:  None  Antibiotics:  Vancomycin 07/19/15>07/20/15  Zosyn 07/19/15>18/17  Tamiflu 07/19/15>  Levaquin 07/20/15>  HPI/Subjective:  Says she feels lousy.   Objective: Filed Vitals:   07/20/15 0451 07/20/15 1431  BP: 127/85 120/62  Pulse: 80 97  Temp: 97.5 F (36.4 C) 98 F (36.7 C)  Resp: 20 21    Intake/Output Summary (Last 24 hours) at 07/20/15 1831 Last data filed at 07/20/15 1625  Gross per 24 hour  Intake   1440 ml  Output    500 ml  Net    940 ml   Filed Weights   07/19/15 1645  Weight: 88.724 kg (195 lb 9.6 oz)    Exam:   General:  Comfortable at  rest.  Cardiovascular: S1-S2 normal. No murmurs. Pulse regular.  Respiratory: Good air entry bilaterally. No rhonchi or rales.  Abdomen: Soft and nontender. Normal bowel sounds. No organomegaly.  Musculoskeletal: No pedal edema   Neurological: Intact  Data Reviewed: Basic Metabolic Panel:  Recent Labs Lab 07/19/15 1036 07/19/15 1253 07/20/15 0610  NA 140  --  142  K 3.7  --  3.5  CL 104  --  107  CO2 25  --  27  GLUCOSE 140*  --  79  BUN 20  --  16  CREATININE 0.88  --  0.62  CALCIUM 8.9  --  8.1*  MG  --  2.0  --    Liver Function Tests:  Recent Labs Lab 07/19/15 1253 07/20/15 0610  AST 23 20  ALT 11* 11*  ALKPHOS 51 45  BILITOT 1.1 1.1  PROT 5.8* 5.4*  ALBUMIN 3.3* 3.1*   No results for input(s): LIPASE, AMYLASE in the last 168 hours. No results for input(s): AMMONIA in the last 168 hours. CBC:  Recent Labs Lab 07/19/15 1036 07/20/15 0610  WBC 9.0 4.5  NEUTROABS 7.5  --   HGB 14.9 13.0  HCT 45.0 41.2  MCV 89.3 92.6  PLT 123* 107*   Cardiac Enzymes:  Recent Labs Lab 07/19/15 1253 07/19/15 1911 07/20/15 0040 07/20/15 0610  TROPONINI 0.57* 0.32* 0.31* 0.25*   BNP (last 3 results)  Recent Labs  07/19/15 1036  BNP 137.3*    ProBNP (last 3 results) No results for input(s): PROBNP in the last 8760 hours.  CBG:  Recent Labs Lab  07/19/15 1717 07/19/15 2230 07/20/15 0719 07/20/15 1223  GLUCAP 100* 87 82 102*    Recent Results (from the past 240 hour(s))  Culture, blood (Routine X 2) w Reflex to ID Panel     Status: None (Preliminary result)   Collection Time: 07/19/15 10:30 AM  Result Value Ref Range Status   Specimen Description BLOOD RIGHT WRIST  Final   Special Requests IN PEDIATRIC BOTTLE 4ML  Final   Culture   Final    NO GROWTH < 24 HOURS Performed at [facility]    Report Status PENDING  Incomplete  Culture, blood (Routine X 2) w Reflex to ID Panel     Status: None (Preliminary result)   Collection Time:  07/19/15 10:30 AM  Result Value Ref Range Status   Specimen Description BLOOD RIGHT FOREARM  Final   Special Requests BOTTLES DRAWN AEROBIC AND ANAEROBIC 5ML  Final   Culture   Final    NO GROWTH < 24 HOURS Performed at [facility]    Report Status PENDING  Incomplete  Urine culture     Status: None   Collection Time: 07/19/15  2:00 PM  Result Value Ref Range Status   Specimen Description URINE, CLEAN CATCH  Final   Special Requests NONE  Final   Culture   Final    MULTIPLE SPECIES PRESENT, SUGGEST RECOLLECTION Performed at [facility]    Report Status 07/20/2015 FINAL  Final     Studies: Dg Chest 2 View  07/19/2015  CLINICAL DATA:  Productive cough with yellow sputum for 2 weeks. EXAM: CHEST  2 VIEW COMPARISON:  Multiple x-rays since June 2015 FINDINGS: There is a rounded opacity in the right middle lobe seen on frontal and lateral views. The finding has become more conspicuous over time. An infrahilar opacity was described in this region on the June 2015 study. No other focal infiltrates are identified. No pneumothorax. The right heart border is obscured. The cardiomediastinal silhouette is otherwise unchanged. A sclerotic lesion in the left humerus is unchanged since February of 2016. IMPRESSION: 1. There is a rounded masslike opacity in the right middle lobe which has slowly progressed since first described in June of 2015. A CT scan could further evaluate. No other interval changes. Electronically Signed   By: Dorise Bullion III M.D   On: 07/19/2015 11:33   Ct Chest W Contrast  07/19/2015  CLINICAL DATA:  Patient has had a productive cough for 2 weeks. No fever. Patient did reported fever earlier this week. Some chest soreness with coughing. EXAM: CT CHEST WITH CONTRAST TECHNIQUE: Multidetector CT imaging of the chest was performed during intravenous contrast administration. CONTRAST:  47mL OMNIPAQUE IOHEXOL 300 MG/ML  SOLN COMPARISON:  Current chest radiograph which  showed a rounded right middle lobe opacity. FINDINGS: Neck base and axilla:  No mass or adenopathy. Mediastinum and hila: Heart is mild to moderately enlarged. There is a small to moderate-sized pericardial effusion. Mild coronary artery calcifications are noted. A main, right and left pulmonary arteries are mildly enlarged, the right pulmonary artery measuring 3.2 cm and left measuring 2.9 cm. No mediastinal or hilar masses or pathologically enlarged lymph nodes. Lungs and pleura: There is central opacity in the right middle and adjacent lower lobes, abutting the right heart border. This is likely atelectasis although could reflect infection. Peripheral to this there is reticular opacity with some mild hazy ground-glass type opacity that may reflect atelectasis or infection. The bronchial walls to the right  middle and lower lobes are thickened. At least a component of pneumonia or bronchitis is suspected given this patient's presenting symptoms. There is no discrete mass or central obstructing lesion. Minimal right pleural effusion. Left lung is essentially clear as is the right upper lobe. Limited upper abdomen: Liver shows morphologic changes consistent with cirrhosis with central volume loss relative enlargement of the caudate lobe and lateral segment of the left lobe and surface nodularity. No discrete liver mass or focal lesion. 16 mm fat density mass arises from the lateral margin of the left kidney consistent with angiomyolipoma. These findings are stable the prior abdomen pelvis CT dated 08/25/2014. Musculoskeletal: Bones are demineralized. There are degenerative changes along the thoracic spine. No osteoblastic or osteolytic lesions. IMPRESSION: 1. No evidence of malignancy. 2. The opacity noted on the current chest radiograph reflects atelectasis or pneumonia in the right middle and adjacent lower lobes abuts the right heart border. The cardiopericardial silhouette is also enlarged in part due to a small  to moderate pericardial effusion which appears mildly increased in size when compared to the prior CT. There is also bronchial wall thickening to the right middle and lower lobes with some additional reticular and hazy ground-glass opacity in the right lower lobe. A combination of atelectasis and bronchitis with possible pneumonia is suspected. 3. Minimal right pleural effusion.  No pulmonary edema. 4. Mild moderate enlargement of the heart. Enlargement of the pulmonary arteries suggesting pulmonary hypertension. 5. Stable changes of cirrhosis. 6. Electronically Signed   By: Lajean Manes M.D.   On: 07/19/2015 13:03    Scheduled Meds: . apixaban  5 mg Oral BID  . diltiazem  360 mg Oral Daily  . ferrous sulfate  325 mg Oral Q breakfast  . insulin aspart  0-9 Units Subcutaneous TID WC  . levofloxacin  750 mg Oral Daily  . levothyroxine  238.5 mcg Oral QAC breakfast  . oseltamivir  30 mg Oral BID  . sodium chloride  3 mL Intravenous Q12H   Continuous Infusions: . sodium chloride Stopped (07/19/15 1703)     Time spent: 25 minutes    Gearald Stonebraker  [facility] Pager [phone]. If 7PM-7AM, please contact night-coverage at [email or web address], password [facility] 07/20/2015, 6:31 PM  LOS: 1 day

## 2015-07-20 NOTE — Progress Notes (Signed)
Pt has refused CPAP for the night.  RT to monitor and assess as needed.  

## 2015-07-20 NOTE — Progress Notes (Signed)
Patient stated she does not wear CPAP any longer.

## 2015-07-20 NOTE — Care Management Note (Signed)
Case Management Note  Patient Details  Name: [name] MRN: [id number] Date of Birth: [birth year]  Subjective/Objective:   82 y/o f admitted w/PNA. From [facility]F-[facility]. PT cons.                 Action/Plan:d/c plan ALF   Expected Discharge Date:   (UNKNOWN)                Expected Discharge Plan:  Assisted Living / Rest Home  In-House Referral:  Clinical Social Work  Discharge planning Services  CM Consult  Post Acute Care Choice:    Choice offered to:     DME Arranged:    DME Agency:     HH Arranged:    HH Agency:     Status of Service:  In process, will continue to follow  Medicare Important Message Given:    Date Medicare IM Given:    Medicare IM give by:    Date Additional Medicare IM Given:    Additional Medicare Important Message give by:     If discussed at [place] of Stay Meetings, dates discussed:    Additional Comments:  Dessa Phi RN, BSN CM [phone]  Dessa Phi, [place] 07/20/2015, 11:55 AM

## 2015-07-20 NOTE — Progress Notes (Signed)
CRITICAL VALUE ALERT  Critical value received:  MRSA positive  Date of notification:  07/19/14  Time of notification:  1940  Critical value read back:Yes.    Nurse who received alert:  Virgina Norfolk  MD notified (1st page):  Nurse protocol initiated   Time of first page:  N/A  MD notified (2nd page):  Time of second page:  Responding MD:  N/A  Time MD responded:  n/a

## 2015-07-21 LAB — BASIC METABOLIC PANEL
ANION GAP: 8 (ref 5–15)
BUN: 11 mg/dL (ref 6–20)
CALCIUM: 8.3 mg/dL — AB (ref 8.9–10.3)
CO2: 24 mmol/L (ref 22–32)
CREATININE: 0.5 mg/dL (ref 0.44–1.00)
Chloride: 108 mmol/L (ref 101–111)
GFR calc Af Amer: >60 mL/min (ref >60)
GLUCOSE: 102 mg/dL — AB (ref 65–99)
Potassium: 4 mmol/L (ref 3.5–5.1)
Sodium: 140 mmol/L (ref 135–145)

## 2015-07-21 LAB — CBC
HEMATOCRIT: 39.8 % (ref 36.0–46.0)
Hemoglobin: 12.3 g/dL (ref 12.0–15.0)
MCH: 28.7 pg (ref 26.0–34.0)
MCHC: 30.9 g/dL (ref 30.0–36.0)
MCV: 93 fL (ref 78.0–100.0)
PLATELETS: 97 10*3/uL — AB (ref 150–400)
RBC: 4.28 MIL/uL (ref 3.87–5.11)
RDW: 15.2 % (ref 11.5–15.5)
WBC: 4.3 10*3/uL (ref 4.0–10.5)

## 2015-07-21 LAB — GLUCOSE, CAPILLARY
Glucose-Capillary: 85 mg/dL (ref 65–99)
Glucose-Capillary: 139 mg/dL — ABNORMAL HIGH (ref 65–99)
Glucose-Capillary: 90 mg/dL (ref 65–99)
Glucose-Capillary: 100 mg/dL — ABNORMAL HIGH (ref 65–99)

## 2015-07-21 MED ORDER — HYDROCOD POLST-CPM POLST ER 10-8 MG/5ML PO SUER
5.0000 mL | Freq: Once | ORAL | Status: AC
  Administered 2015-07-21: 5 mL via ORAL
  Filled 2015-07-21: qty 5

## 2015-07-21 NOTE — Progress Notes (Signed)
Pt wishes not to utilize hospital provided CPAP machine while here in hospital.  RT to monitor and assess as needed.

## 2015-07-21 NOTE — Progress Notes (Signed)
BRIYAH EDMINSTER [id number] DOB: Oct 26, 1919 DOA: 07/19/2015 PCP: [name], MD  Summary&Daily Progress Notes 07/20/15: I have seen and examined Ms. Rendleman at bedside and reviewed her chart. Ashala Hinz is a pleasant 82 year old female SNF resident with a history of 16 bowel obstructions in the past 5 years, and chronic diastolic heart failure, chronic A. fib, ovarian cancer, bladder cancer, and obstructive sleep apnea on Cpap, who presented to the ER with sinus congestion, chest congestion, nonproductive cough, low-grade fever, worsening shortness of breath and she is found to have influenza type B 8. There is concern for HCAP although all her symptoms can be explained by the Flu. Will therefore de-escalate antibiotics to Levaquin and continue Tamiflu to complete 5 days. Positive troponins likely related to influenza. Patient states that she still feels lousy.  07/21/15: Feels better. A little lethargic. Will continue Tamiflu/Levaquin and plan discharge to ALF in a.m. if patient continues to do well. Problem List Plan  Principal Problem:   Type A influenza Active Problems:   HCAP (healthcare-associated pneumonia)   Chronic diastolic heart failure (HCC)   Atrial fibrillation (HCC)   Diabetes mellitus (HCC)   Hypothyroidism   Venous insufficiency   Day 3 Tamiflu  Day 2 Levaquin  Code Status: Full Code Family Communication: None at bedside Disposition Plan: Back to SNF eventually   Consultants:  None  Procedures:  None  Antibiotics:  Vancomycin 07/19/15>07/20/15  Zosyn 07/19/15>18/17  Tamiflu 07/19/15>  Levaquin 07/20/15>  HPI/Subjective: Denies shortness of breath.   Objective: Filed Vitals:   07/20/15 2211 07/21/15 0653  BP: 149/51 116/69  Pulse: 75 66  Temp: 97.5 F (36.4 C) 97.3 F (36.3 C)  Resp: 20 20    Intake/Output Summary (Last 24 hours) at 07/21/15 1349 Last data filed at 07/21/15 0636  Gross per 24 hour  Intake    360 ml  Output    600 ml   Net   -240 ml   Filed Weights   07/19/15 1645 07/21/15 0706  Weight: 88.724 kg (195 lb 9.6 oz) 92.08 kg (203 lb)    Exam:   General:  Comfortable at rest.  Cardiovascular: S1-S2 normal. No murmurs. Pulse regular.  Respiratory: Good air entry bilaterally. No rhonchi or rales.  Abdomen: Soft and nontender. Normal bowel sounds. No organomegaly.  Musculoskeletal: No pedal edema   Neurological: Intact  Data Reviewed: Basic Metabolic Panel:  Recent Labs Lab 07/19/15 1036 07/19/15 1253 07/20/15 0610 07/21/15 0248  NA 140  --  142 140  K 3.7  --  3.5 4.0  CL 104  --  107 108  CO2 25  --  27 24  GLUCOSE 140*  --  79 102*  BUN 20  --  16 11  CREATININE 0.88  --  0.62 0.50  CALCIUM 8.9  --  8.1* 8.3*  MG  --  2.0  --   --    Liver Function Tests:  Recent Labs Lab 07/19/15 1253 07/20/15 0610  AST 23 20  ALT 11* 11*  ALKPHOS 51 45  BILITOT 1.1 1.1  PROT 5.8* 5.4*  ALBUMIN 3.3* 3.1*   No results for input(s): LIPASE, AMYLASE in the last 168 hours. No results for input(s): AMMONIA in the last 168 hours. CBC:  Recent Labs Lab 07/19/15 1036 07/20/15 0610 07/21/15 0248  WBC 9.0 4.5 4.3  NEUTROABS 7.5  --   --   HGB 14.9 13.0 12.3  HCT 45.0 41.2 39.8  MCV 89.3 92.6 93.0  PLT 123*  107* 97*   Cardiac Enzymes:  Recent Labs Lab 07/19/15 1253 07/19/15 1911 07/20/15 0040 07/20/15 0610  TROPONINI 0.57* 0.32* 0.31* 0.25*   BNP (last 3 results)  Recent Labs  07/19/15 1036  BNP 137.3*    ProBNP (last 3 results) No results for input(s): PROBNP in the last 8760 hours.  CBG:  Recent Labs Lab 07/20/15 1223 07/20/15 1719 07/20/15 2217 07/21/15 0759 07/21/15 1146  GLUCAP 102* 123* 88 85 139*    Recent Results (from the past 240 hour(s))  Culture, blood (Routine X 2) w Reflex to ID Panel     Status: None (Preliminary result)   Collection Time: 07/19/15 10:30 AM  Result Value Ref Range Status   Specimen Description BLOOD RIGHT WRIST  Final    Special Requests IN PEDIATRIC BOTTLE 4ML  Final   Culture   Final    NO GROWTH 2 DAYS Performed at [facility]    Report Status PENDING  Incomplete  Culture, blood (Routine X 2) w Reflex to ID Panel     Status: None (Preliminary result)   Collection Time: 07/19/15 10:30 AM  Result Value Ref Range Status   Specimen Description BLOOD RIGHT FOREARM  Final   Special Requests BOTTLES DRAWN AEROBIC AND ANAEROBIC 5ML  Final   Culture   Final    NO GROWTH 2 DAYS Performed at [facility]    Report Status PENDING  Incomplete  Urine culture     Status: None   Collection Time: 07/19/15  2:00 PM  Result Value Ref Range Status   Specimen Description URINE, CLEAN CATCH  Final   Special Requests NONE  Final   Culture   Final    MULTIPLE SPECIES PRESENT, SUGGEST RECOLLECTION Performed at [facility]    Report Status 07/20/2015 FINAL  Final  Respiratory virus panel     Status: None   Collection Time: 07/19/15  2:07 PM  Result Value Ref Range Status   Respiratory Syncytial Virus A Negative Negative Final   Respiratory Syncytial Virus B Negative Negative Final   Influenza A Negative Negative Final   Influenza B Negative Negative Final   Parainfluenza 1 Negative Negative Final   Parainfluenza 2 Negative Negative Final   Parainfluenza 3 Negative Negative Final   Metapneumovirus Negative Negative Final   Rhinovirus Negative Negative Final   Adenovirus Negative Negative Final    Comment: (NOTE) Performed At: [facility] [place], [place] [id number] Lindon Romp MD [id number]   MRSA PCR Screening     Status: Abnormal   Collection Time: 07/20/15  6:01 PM  Result Value Ref Range Status   MRSA by PCR POSITIVE (A) NEGATIVE Final    Comment:        The GeneXpert MRSA Assay (FDA approved for NASAL specimens only), is one component of a comprehensive MRSA colonization surveillance program. It is not intended to diagnose MRSA infection nor  to guide or monitor treatment for MRSA infections. RESULT CALLED TO, READ BACK BY AND VERIFIED WITH: CARLIE ADDISON,RN [id number] @ [place]      Studies: No results found.  Scheduled Meds: . apixaban  5 mg Oral BID  . Chlorhexidine Gluconate Cloth  6 each Topical Q0600  . diltiazem  360 mg Oral Daily  . ferrous sulfate  325 mg Oral Q breakfast  . insulin aspart  0-9 Units Subcutaneous TID WC  . levofloxacin  750 mg Oral Q48H  . levothyroxine  238.5 mcg  Oral QAC breakfast  . mupirocin ointment  1 application Nasal BID  . oseltamivir  30 mg Oral BID  . sodium chloride  3 mL Intravenous Q12H   Continuous Infusions: . sodium chloride Stopped (07/19/15 1703)     Time spent: 15 minutes    Kynslee Baham  [facility] Pager [phone]. If 7PM-7AM, please contact night-coverage at [email or web address], password [facility] 07/21/2015, 1:49 PM  LOS: 2 days

## 2015-07-21 NOTE — Progress Notes (Signed)
Patient with a  2.38 second pause. Patient resting comfortably. NP on call notified. No new orders placed. Will continue to monitor closely

## 2015-07-21 NOTE — Evaluation (Signed)
Physical Therapy Evaluation Patient Details Name: [name] MRN: [id number] DOB: Sep 22, 1919 Today's Date: 07/21/2015   History of Present Illness  82 yo female admitted with Pna. Hx of HF, A fib, ov cancer, bladder cancer, sleep apnea.   Clinical Impression  On eval, pt required Min assist for mobility-walked ~15 feet x 2 with RW (in room only-pt declined ambulation in hallway). O2 sat reading was 93% RA at rest; 76% on RA once back in bed after walking to/from bathroom...                (? Accuracy?). Replaced [place] O2-sats quickly returned to 90% on 1.5-2L O2. Spoke with RN and requested pt walk to bathroom, when possible, instead of using BSC. Recommend HHPT follow up at ALF.     Follow Up Recommendations Home health PT (at ALF)    Equipment Recommendations  None recommended by PT    Recommendations for Other Services       Precautions / Restrictions Precautions Precautions: Fall Precaution Comments: monitor sats Restrictions Weight Bearing Restrictions: No      Mobility  Bed Mobility Overal bed mobility: Needs Assistance Bed Mobility: Supine to Sit;Sit to Supine     Supine to sit: Min assist;HOB elevated Sit to supine: Min assist;HOB elevated   General bed mobility comments: small amount of assist for trunk and LEs. Increased time.   Transfers Overall transfer level: Needs assistance Equipment used: Rolling walker (2 wheeled) Transfers: Sit to/from Stand Sit to Stand: Min assist         General transfer comment: assist to rise, stabilize, control descent (especially to/from low toilet surface). VCs safety, hand placement  Ambulation/Gait Ambulation/Gait assistance: Min guard Ambulation Distance (Feet): 15 Feet (x2) Assistive device: Rolling walker (2 wheeled) Gait Pattern/deviations: Step-through pattern;Decreased stride length;Trunk flexed     General Gait Details: close guard for safety. slow gait speed.   Stairs            Wheelchair  Mobility    Modified Rankin (Stroke Patients Only)       Balance Overall balance assessment: Needs assistance         Standing balance support: Bilateral upper extremity supported Standing balance-Leahy Scale: Poor Standing balance comment: requires RW                             Pertinent Vitals/Pain Pain Assessment: No/denies pain    Home Living Family/patient expects to be discharged to:: Assisted living     Type of Home: Assisted living         Home Equipment: Walker - 2 wheels      Prior Function Level of Independence: Needs assistance   Gait / Transfers Assistance Needed: ambulate with RW to dining room  ADL's / Homemaking Assistance Needed: assist with showers        Hand Dominance        Extremity/Trunk Assessment   Upper Extremity Assessment: Generalized weakness           Lower Extremity Assessment: Generalized weakness      Cervical / Trunk Assessment: Kyphotic  Communication   Communication: No difficulties  Cognition Arousal/Alertness: Awake/alert Behavior During Therapy: WFL for tasks assessed/performed Overall Cognitive Status: Within Functional Limits for tasks assessed                      General Comments      Exercises  Assessment/Plan    PT Assessment Patient needs continued PT services  PT Diagnosis Difficulty walking;Generalized weakness   PT Problem List Decreased strength;Decreased activity tolerance;Decreased balance;Decreased mobility  PT Treatment Interventions DME instruction;Gait training;Functional mobility training;Therapeutic activities;Patient/family education;Balance training   PT Goals (Current goals can be found in the Care Plan section) Acute Rehab PT Goals Patient Stated Goal: return to ALF PT Goal Formulation: With patient Time For Goal Achievement: 08/04/15 Potential to Achieve Goals: Good    Frequency Min 3X/week   Barriers to discharge        Co-evaluation                End of Session   Activity Tolerance: Patient tolerated treatment well Patient left: in bed;with call bell/phone within reach;with bed alarm set           Time: 1450-1516 PT Time Calculation (min) (ACUTE ONLY): 26 min   Charges:   PT Evaluation $PT Eval Moderate Complexity: 1 Procedure PT Treatments $Gait Training: 8-22 mins   PT G Codes:        Weston Anna, MPT Pager: [phone]

## 2015-07-21 NOTE — Progress Notes (Signed)
CCMD called and alerted me that patient had a 2.11 second pause.  Pt asymptomatic.  MD made aware, not new orders.  Will monitor closely.

## 2015-07-22 ENCOUNTER — Ambulatory Visit: Payer: Medicare Other | Admitting: Internal Medicine

## 2015-07-22 LAB — GLUCOSE, CAPILLARY
GLUCOSE-CAPILLARY: 95 mg/dL (ref 65–99)
GLUCOSE-CAPILLARY: 135 mg/dL — AB (ref 65–99)

## 2015-07-22 MED ORDER — OSELTAMIVIR PHOSPHATE 30 MG PO CAPS: 30.0000 mg | ORAL_CAPSULE | Freq: Two times a day (BID) | ORAL | Status: DC

## 2015-07-22 MED ORDER — LEVOFLOXACIN 750 MG PO TABS: 750.0000 mg | ORAL_TABLET | ORAL | Status: DC

## 2015-07-22 MED ORDER — MUPIROCIN 2 % EX OINT: 1.0000 "application " | TOPICAL_OINTMENT | Freq: Two times a day (BID) | CUTANEOUS | Status: DC

## 2015-07-22 MED ORDER — FUROSEMIDE 40 MG PO TABS: 40.0000 mg | ORAL_TABLET | Freq: Every day | ORAL | Status: DC

## 2015-07-22 NOTE — Progress Notes (Signed)
Patient is set to discharge back to [facility] today. Patient & daughter, Gerald Stabs (ph#: [phone]) aware. Phone number to call report given to RN, Catie. Daughter to pick up patient between 1-1:30pm, RN aware.     Raynaldo Opitz, [facility] Clinical Social Worker cell #: [phone]

## 2015-07-22 NOTE — Progress Notes (Signed)
Report called to receiving RN at [facility]. Patient will be going back via her daughter, Altha Harm. Patient is in stable condition and aware of the situation.

## 2015-07-22 NOTE — NC FL2 (Deleted)
[place] MEDICAID FL2 LEVEL OF CARE SCREENING TOOL     IDENTIFICATION  Patient Name: [name] Birthdate: [birth year] Sex: female Admission Date (Current Location): 07/19/2015  [facility] and [place] Number:  Herbalist and Address:  [facility],  [place] [address], [place]      Provider Number: [id number]  Attending Physician Name and Address:  Nat Math, MD  Relative Name and Phone Number:       Current Level of Care: Hospital Recommended Level of Care: [place] Prior Approval Number:    Date Approved/Denied:   PASRR Number: [id number]  Discharge Plan: Other (Comment) (ALF)    Current Diagnoses: Patient Active Problem List   Diagnosis Date Noted  . Bowel obstruction ([place]) 08/25/2014  . Atrial fibrillation with RVR ([place]) 08/25/2014  . Acute renal failure ([place]) 08/25/2014  . Diastolic heart failure ([place]) 08/25/2014  . AKI (acute kidney injury) ([place]) 12/29/2013  . Small bowel obstruction ([place]) 12/28/2013  . Essential hypertension 03/23/2013  . Hematoma 12/08/2012  . Nausea & vomiting 11/11/2012  . Diabetes mellitus ([place]) 11/11/2012  . Hypothyroidism 11/11/2012  . Hx SBO 11/11/2012  . Colon cancer ([place]) 11/11/2012  . Diastolic CHF, chronic ([place]) 11/11/2012  . Venous insufficiency 11/11/2012  . Hyperlipidemia 11/11/2012  . OSA (obstructive sleep apnea) 11/11/2012  . HX: breast cancer 11/11/2012  . Hx of cervical cancer 11/11/2012  . H/O ovarian cancer 11/11/2012  . Hx of bladder cancer 11/11/2012  . S/P hysterectomy 11/11/2012  . PNA (pneumonia) 11/11/2012  . Hypokalemia 12/09/2011  . SBO (small bowel obstruction) ([place]) 12/06/2011  . Chronic diastolic heart failure ([place]) 02/17/2011  . Atrial fibrillation ([place]) 02/17/2011    Orientation RESPIRATION BLADDER Height & Weight    Self, Time, Situation, Place  O2 (2L) Continent 5' (152.4 cm) 202 lbs.  BEHAVIORAL SYMPTOMS/MOOD NEUROLOGICAL BOWEL NUTRITION  STATUS      Continent Diet (low sodium heart healthy )  AMBULATORY STATUS COMMUNICATION OF NEEDS Skin   Limited Assist Verbally Normal                       Personal Care Assistance Level of Assistance  Bathing, Dressing Bathing Assistance: Limited assistance   Dressing Assistance: Limited assistance     Functional Limitations Info             SPECIAL CARE FACTORS FREQUENCY  PT (By licensed PT), OT (By licensed OT)                    Contractures      Additional Factors Info  Code Status, Allergies Code Status Info: Fullcode Allergies Info: Codeine, Morphine And Related, Statins, Zetia            Discharge Medications: Medication List    TAKE these medications       apixaban 5 MG Tabs tablet  Commonly known as: ELIQUIS  Take 1 tablet (5 mg total) by mouth 2 (two) times daily.     bisacodyl 10 MG suppository  Commonly known as: DULCOLAX  Place 1 suppository (10 mg total) rectally daily as needed for moderate constipation.     bisacodyl 5 MG EC tablet  Commonly known as: DULCOLAX  Take 5 mg by mouth daily.     diltiazem 360 MG 24 hr capsule  Commonly known as: TIAZAC  Take 360 mg by mouth daily.     ferrous sulfate 325 (65 FE) MG tablet  Take 325  mg by mouth daily with breakfast.     furosemide 80 MG tablet  Commonly known as: LASIX  Take 40 mg by mouth daily as needed for fluid (swelling).     furosemide 40 MG tablet  Commonly known as: LASIX  Take 1 tablet (40 mg total) by mouth daily.     levofloxacin 750 MG tablet  Commonly known as: LEVAQUIN  Take 1 tablet (750 mg total) by mouth every other day.     levothyroxine 200 MCG tablet  Commonly known as: SYNTHROID, LEVOTHROID  Take 200 mcg by mouth daily before breakfast.     lisinopril 5 MG tablet  Commonly known as: PRINIVIL,ZESTRIL  Take 5 mg by mouth daily.     mupirocin ointment 2 %  Commonly known as:  BACTROBAN  Place 1 application into the nose 2 (two) times daily.     oseltamivir 30 MG capsule  Commonly known as: TAMIFLU  Take 1 capsule (30 mg total) by mouth 2 (two) times daily.     potassium chloride SA 20 MEQ tablet  Commonly known as: K-DUR,KLOR-CON  Take 20 mEq by mouth daily.     senna-docusate 8.6-50 MG tablet  Commonly known as: Senokot-S  Take 1 tablet by mouth 2 (two) times daily.     sodium phosphate 7-19 GM/118ML Enem  Place 133 mLs (1 enema total) rectally daily as needed for severe constipation.     VITAMIN B-12 IJ  Inject 1 mL as directed every 30 (thirty) days.           Relevant Imaging Results:  Relevant Lab Results:   Additional Information SSN: [id number]  Standley Brooking, LCSW

## 2015-07-22 NOTE — Discharge Summary (Addendum)
[name], is a 82 y.o. female  DOB 16-Feb-1920  MRN [id number].  Admission date:  07/19/2015  Admitting Physician  Reyne Dumas, MD  Discharge Date:  07/26/2015   Primary MD  [name], MD  Recommendations for primary care physician for things to follow:  Please follow TSH and adjust Synthroid as necessary since the level was low here and also follow renal function.   Admission Diagnosis   Shortness of breath [R06.02] Cough [R05] HCAP (healthcare-associated pneumonia) [J18.9] Atrial fibrillation, unspecified type ([place]) [I48.91]   Discharge Diagnosis  Shortness of breath [R06.02] Cough [R05] HCAP (healthcare-associated pneumonia) [J18.9] Atrial fibrillation, unspecified type ([place]) [I48.91]   Principal Problem:   Type A influenza Active Problems:   Chronic diastolic heart failure (HCC)   Atrial fibrillation ([place])   Diabetes mellitus ([place])   Hypothyroidism   Venous insufficiency      Hospital Course  [name] is a pleasant 82 year old female ALF resident with a history of 16 bowel obstructions in the past 5 years, and chronic diastolic heart failure, chronic A. fib, ovarian cancer, bladder cancer, and obstructive sleep apnea on Cpap, who presented to the ER with sinus congestion, chest congestion, nonproductive cough, low-grade fever, worsening shortness of breath and she was found to have influenza type A. There was concern for HCAP although all her symptoms can be explained by the Flu.Patient responded to antibiotics and she will be discharged to complete course of Levaquin and Tamiflu. Her TSH was on the low side and I have deferred adjustment of her synthroid to her primary care provider. She will need renal function closely monitored. She feels close to her baseline and requests  discharge. She will therefore discharge back to assisted living facility home PT.  Discharge Condition Stable.  Consults obtained  None  Follow UP PCP in 1-2 weeks    Discharge Instructions  and  Discharge Medications      Discharge Instructions    Diet - low sodium heart healthy    Complete by:  As directed      Discharge instructions    Complete by:  As directed   Please follow with your primary care provider in 1-2 weeks     Increase activity slowly    Complete by:  As directed             Medication List    TAKE these medications        apixaban 5 MG Tabs tablet  Commonly known as:  ELIQUIS  Take 1 tablet (5 mg total) by mouth 2 (two) times daily.     bisacodyl 10 MG suppository  Commonly known as:  DULCOLAX  Place 1 suppository (10 mg total) rectally daily as needed for moderate constipation.     bisacodyl 5 MG EC tablet  Commonly known as:  DULCOLAX  Take 5 mg by mouth daily.     diltiazem 360 MG 24 hr capsule  Commonly known as:  TIAZAC  Take 360 mg by mouth daily.  ferrous sulfate 325 (65 FE) MG tablet  Take 325 mg by mouth daily with breakfast.     furosemide 80 MG tablet  Commonly known as:  LASIX  Take 40 mg by mouth daily as needed for fluid (swelling).     furosemide 40 MG tablet  Commonly known as:  LASIX  Take 1 tablet (40 mg total) by mouth daily.     levofloxacin 750 MG tablet  Commonly known as:  LEVAQUIN  Take 1 tablet (750 mg total) by mouth every other day.     levothyroxine 200 MCG tablet  Commonly known as:  SYNTHROID, LEVOTHROID  Take 200 mcg by mouth daily before breakfast.     lisinopril 5 MG tablet  Commonly known as:  PRINIVIL,ZESTRIL  Take 5 mg by mouth daily.     mupirocin ointment 2 %  Commonly known as:  BACTROBAN  Place 1 application into the nose 2 (two) times daily.     oseltamivir 30 MG capsule  Commonly known as:  TAMIFLU  Take 1 capsule (30 mg total) by mouth 2 (two) times daily.     potassium  chloride SA 20 MEQ tablet  Commonly known as:  K-DUR,KLOR-CON  Take 20 mEq by mouth daily.     senna-docusate 8.6-50 MG tablet  Commonly known as:  Senokot-S  Take 1 tablet by mouth 2 (two) times daily.     sodium phosphate 7-19 GM/118ML Enem  Place 133 mLs (1 enema total) rectally daily as needed for severe constipation.     VITAMIN B-12 IJ  Inject 1 mL as directed every 30 (thirty) days.        Diet and Activity recommendation: See Discharge Instructions above  Major procedures and Radiology Reports - PLEASE review detailed and final reports for all details, in brief -    Dg Chest 2 View  07/19/2015  CLINICAL DATA:  Productive cough with yellow sputum for 2 weeks. EXAM: CHEST  2 VIEW COMPARISON:  Multiple x-rays since June 2015 FINDINGS: There is a rounded opacity in the right middle lobe seen on frontal and lateral views. The finding has become more conspicuous over time. An infrahilar opacity was described in this region on the June 2015 study. No other focal infiltrates are identified. No pneumothorax. The right heart border is obscured. The cardiomediastinal silhouette is otherwise unchanged. A sclerotic lesion in the left humerus is unchanged since February of 2016. IMPRESSION: 1. There is a rounded masslike opacity in the right middle lobe which has slowly progressed since first described in June of 2015. A CT scan could further evaluate. No other interval changes. Electronically Signed   By: Dorise Bullion III M.D   On: 07/19/2015 11:33   Ct Chest W Contrast  07/19/2015  CLINICAL DATA:  Patient has had a productive cough for 2 weeks. No fever. Patient did reported fever earlier this week. Some chest soreness with coughing. EXAM: CT CHEST WITH CONTRAST TECHNIQUE: Multidetector CT imaging of the chest was performed during intravenous contrast administration. CONTRAST:  98mL OMNIPAQUE IOHEXOL 300 MG/ML  SOLN COMPARISON:  Current chest radiograph which showed a rounded right middle  lobe opacity. FINDINGS: Neck base and axilla:  No mass or adenopathy. Mediastinum and hila: Heart is mild to moderately enlarged. There is a small to moderate-sized pericardial effusion. Mild coronary artery calcifications are noted. A main, right and left pulmonary arteries are mildly enlarged, the right pulmonary artery measuring 3.2 cm and left measuring 2.9 cm. No mediastinal or hilar masses  or pathologically enlarged lymph nodes. Lungs and pleura: There is central opacity in the right middle and adjacent lower lobes, abutting the right heart border. This is likely atelectasis although could reflect infection. Peripheral to this there is reticular opacity with some mild hazy ground-glass type opacity that may reflect atelectasis or infection. The bronchial walls to the right middle and lower lobes are thickened. At least a component of pneumonia or bronchitis is suspected given this patient's presenting symptoms. There is no discrete mass or central obstructing lesion. Minimal right pleural effusion. Left lung is essentially clear as is the right upper lobe. Limited upper abdomen: Liver shows morphologic changes consistent with cirrhosis with central volume loss relative enlargement of the caudate lobe and lateral segment of the left lobe and surface nodularity. No discrete liver mass or focal lesion. 16 mm fat density mass arises from the lateral margin of the left kidney consistent with angiomyolipoma. These findings are stable the prior abdomen pelvis CT dated 08/25/2014. Musculoskeletal: Bones are demineralized. There are degenerative changes along the thoracic spine. No osteoblastic or osteolytic lesions. IMPRESSION: 1. No evidence of malignancy. 2. The opacity noted on the current chest radiograph reflects atelectasis or pneumonia in the right middle and adjacent lower lobes abuts the right heart border. The cardiopericardial silhouette is also enlarged in part due to a small to moderate pericardial  effusion which appears mildly increased in size when compared to the prior CT. There is also bronchial wall thickening to the right middle and lower lobes with some additional reticular and hazy ground-glass opacity in the right lower lobe. A combination of atelectasis and bronchitis with possible pneumonia is suspected. 3. Minimal right pleural effusion.  No pulmonary edema. 4. Mild moderate enlargement of the heart. Enlargement of the pulmonary arteries suggesting pulmonary hypertension. 5. Stable changes of cirrhosis. 6. Electronically Signed   By: Lajean Manes M.D.   On: 07/19/2015 13:03    Micro Results   Recent Results (from the past 240 hour(s))  Culture, blood (Routine X 2) w Reflex to ID Panel     Status: None   Collection Time: 07/19/15 10:30 AM  Result Value Ref Range Status   Specimen Description BLOOD RIGHT WRIST  Final   Special Requests IN PEDIATRIC BOTTLE 4ML  Final   Culture   Final    NO GROWTH 5 DAYS Performed at [facility]    Report Status 07/24/2015 FINAL  Final  Culture, blood (Routine X 2) w Reflex to ID Panel     Status: None   Collection Time: 07/19/15 10:30 AM  Result Value Ref Range Status   Specimen Description BLOOD RIGHT FOREARM  Final   Special Requests BOTTLES DRAWN AEROBIC AND ANAEROBIC 5ML  Final   Culture   Final    NO GROWTH 5 DAYS Performed at [facility]    Report Status 07/24/2015 FINAL  Final  Urine culture     Status: None   Collection Time: 07/19/15  2:00 PM  Result Value Ref Range Status   Specimen Description URINE, CLEAN CATCH  Final   Special Requests NONE  Final   Culture   Final    MULTIPLE SPECIES PRESENT, SUGGEST RECOLLECTION Performed at [facility]    Report Status 07/20/2015 FINAL  Final  Respiratory virus panel     Status: None   Collection Time: 07/19/15  2:07 PM  Result Value Ref Range Status   Respiratory Syncytial Virus A Negative Negative Final   Respiratory Syncytial  Virus B Negative Negative  Final   Influenza A Negative Negative Final   Influenza B Negative Negative Final   Parainfluenza 1 Negative Negative Final   Parainfluenza 2 Negative Negative Final   Parainfluenza 3 Negative Negative Final   Metapneumovirus Negative Negative Final   Rhinovirus Negative Negative Final   Adenovirus Negative Negative Final    Comment: (NOTE) Performed At: [facility] [address] [place], [place] [id number] Lindon Romp MD [id number]   MRSA PCR Screening     Status: Abnormal   Collection Time: 07/20/15  6:01 PM  Result Value Ref Range Status   MRSA by PCR POSITIVE (A) NEGATIVE Final    Comment:        The GeneXpert MRSA Assay (FDA approved for NASAL specimens only), is one component of a comprehensive MRSA colonization surveillance program. It is not intended to diagnose MRSA infection nor to guide or monitor treatment for MRSA infections. RESULT CALLED TO, READ BACK BY AND VERIFIED WITH: CARLIE ADDISON,RN [id number] @ [facility]        Today   Subjective:   Dalery Heitkamp today has no headache,no chest abdominal pain,no new weakness tingling or numbness, feels much better wants to go home today.   Objective:   Blood pressure 151/89, pulse 82, temperature 97.5 F (36.4 C), temperature source Oral, resp. rate 20, height 5' (1.524 m), weight 91.808 kg (202 lb 6.4 oz), SpO2 99 %.  No intake or output data in the 24 hours ending 07/26/15 2248  Exam Awake Alert, Oriented x 3, No new F.N deficits, Normal affect [place].AT,PERRAL Supple Neck,No JVD, No cervical lymphadenopathy appriciated.  Symmetrical Chest wall movement, Good air movement bilaterally, CTAB RRR,No Gallops,Rubs or new Murmurs, No Parasternal Heave +ve B.Sounds, Abd Soft, Non tender, No organomegaly appriciated, No rebound -guarding or rigidity. No Cyanosis, Clubbing or edema, No new Rash or bruise  Data Review   CBC w Diff:  Lab Results  Component Value Date   WBC 4.3 07/21/2015     WBC 5.7 12/22/2014   HGB 12.3 07/21/2015   HCT 39.8 07/21/2015   HCT 40.8 12/22/2014   PLT 97* 07/21/2015   PLT 165 12/22/2014   LYMPHOPCT 8 07/19/2015   MONOPCT 8 07/19/2015   EOSPCT 0 07/19/2015   BASOPCT 0 07/19/2015    CMP:  Lab Results  Component Value Date   NA 140 07/21/2015   NA 141 12/22/2014   K 4.0 07/21/2015   CL 108 07/21/2015   CO2 24 07/21/2015   BUN 11 07/21/2015   BUN 17 12/22/2014   CREATININE 0.50 07/21/2015   PROT 5.4* 07/20/2015   ALBUMIN 3.1* 07/20/2015   BILITOT 1.1 07/20/2015   ALKPHOS 45 07/20/2015   AST 20 07/20/2015   ALT 11* 07/20/2015  .   Total Time in preparing paper work, data evaluation and todays exam - 35 minutes  Bynum Mccullars M.D on 07/26/2015 at 10:48 PM  [facility] Office  [phone]

## 2015-07-22 NOTE — NC FL2 (Signed)
[place] MEDICAID FL2 LEVEL OF CARE SCREENING TOOL     IDENTIFICATION  Patient Name: [name] Birthdate: 19-Jun-1920 Sex: female Admission Date (Current Location): 07/19/2015  [facility] and [place] Number:  Herbalist and Address:  [facility],  [place] [address] [place]      Provider Number: [id number]  Attending Physician Name and Address:  Nat Math, MD  Relative Name and Phone Number:       Current Level of Care: Hospital Recommended Level of Care: [place] Prior Approval Number:    Date Approved/Denied:   PASRR Number: [id number]  Discharge Plan: Other (Comment) (ALF)    Current Diagnoses: Patient Active Problem List   Diagnosis Date Noted  . Bowel obstruction ([place]) 08/25/2014  . Atrial fibrillation with RVR ([place]) 08/25/2014  . Acute renal failure ([place]) 08/25/2014  . Diastolic heart failure ([place]) 08/25/2014  . AKI (acute kidney injury) ([place]) 12/29/2013  . Small bowel obstruction ([place]) 12/28/2013  . Essential hypertension 03/23/2013  . Hematoma 12/08/2012  . Nausea & vomiting 11/11/2012  . Diabetes mellitus ([place]) 11/11/2012  . Hypothyroidism 11/11/2012  . Hx SBO 11/11/2012  . Colon cancer ([place]) 11/11/2012  . Diastolic CHF, chronic ([place]) 11/11/2012  . Venous insufficiency 11/11/2012  . Hyperlipidemia 11/11/2012  . OSA (obstructive sleep apnea) 11/11/2012  . HX: breast cancer 11/11/2012  . Hx of cervical cancer 11/11/2012  . H/O ovarian cancer 11/11/2012  . Hx of bladder cancer 11/11/2012  . S/P hysterectomy 11/11/2012  . PNA (pneumonia) 11/11/2012  . Hypokalemia 12/09/2011  . SBO (small bowel obstruction) ([place]) 12/06/2011  . Chronic diastolic heart failure ([place]) 02/17/2011  . Atrial fibrillation ([place]) 02/17/2011    Orientation RESPIRATION BLADDER Height & Weight    Self, Time, Situation, Place  O2 (2L) Continent 5' (152.4 cm) 202 lbs.  BEHAVIORAL SYMPTOMS/MOOD NEUROLOGICAL BOWEL NUTRITION  STATUS      Continent Diet (low sodium heart healthy )  AMBULATORY STATUS COMMUNICATION OF NEEDS Skin   Limited Assist Verbally Normal                       Personal Care Assistance Level of Assistance  Bathing, Dressing Bathing Assistance: Limited assistance   Dressing Assistance: Limited assistance     Functional Limitations Info             SPECIAL CARE FACTORS FREQUENCY  PT (By licensed PT), OT (By licensed OT)                    Contractures      Additional Factors Info  Code Status, Allergies Code Status Info: Fullcode Allergies Info: Codeine, Morphine And Related, Statins, Zetia            Discharge Medications: Medication List    TAKE these medications       apixaban 5 MG Tabs tablet  Commonly known as: ELIQUIS  Take 1 tablet (5 mg total) by mouth 2 (two) times daily.     bisacodyl 10 MG suppository  Commonly known as: DULCOLAX  Place 1 suppository (10 mg total) rectally daily as needed for moderate constipation.     bisacodyl 5 MG EC tablet  Commonly known as: DULCOLAX  Take 5 mg by mouth daily.     diltiazem 360 MG 24 hr capsule  Commonly known as: TIAZAC  Take 360 mg by mouth daily.     ferrous sulfate 325 (65 FE) MG tablet  Take 325  mg by mouth daily with breakfast.     furosemide 80 MG tablet  Commonly known as: LASIX  Take 40 mg by mouth daily as needed for fluid (swelling).     furosemide 40 MG tablet  Commonly known as: LASIX  Take 1 tablet (40 mg total) by mouth daily.     levofloxacin 750 MG tablet  Commonly known as: LEVAQUIN  (last dose @ 20:00 today 07/22/15)  Take 1 tablet (750 mg total) by mouth every other day.     levothyroxine 200 MCG tablet  Commonly known as: SYNTHROID, LEVOTHROID  Take 200 mcg by mouth daily before breakfast.     lisinopril 5 MG tablet  Commonly known as: PRINIVIL,ZESTRIL  Take 5 mg by mouth daily.     mupirocin  ointment 2 %  Commonly known as: BACTROBAN  Place 1 application into the nose 2 (two) times daily.     oseltamivir 30 MG capsule  Commonly known as: TAMIFLU  (last dose 10:00 07/24/15)  Take 1 capsule (30 mg total) by mouth 2 (two) times daily.     potassium chloride SA 20 MEQ tablet  Commonly known as: K-DUR,KLOR-CON  Take 20 mEq by mouth daily.     senna-docusate 8.6-50 MG tablet  Commonly known as: Senokot-S  Take 1 tablet by mouth 2 (two) times daily.     sodium phosphate 7-19 GM/118ML Enem  Place 133 mLs (1 enema total) rectally daily as needed for severe constipation.     VITAMIN B-12 IJ  Inject 1 mL as directed every 30 (thirty) days.            Relevant Imaging Results:  Relevant Lab Results:   Additional Information SSN: [id number]  Standley Brooking, LCSW

## 2015-07-22 NOTE — Care Management Note (Signed)
Case Management Note  Patient Details  Name: LAYCEE NUMBERS MRN: [id number] Date of Birth: 09/07/1919  Subjective/Objective:  [facility] able to provide HHPT @ facility.                  Action/Plan:d/c back to ALF   Expected Discharge Date:   (UNKNOWN)               Expected Discharge Plan:  Assisted Living / Rest Home  In-House Referral:  Clinical Social Work  Discharge planning Services  CM Consult  Post Acute Care Choice:    Choice offered to:     DME Arranged:    DME Agency:     HH Arranged:  PT HH Agency:     Status of Service:  Completed, signed off  Medicare Important Message Given:  Yes Date Medicare IM Given:    Medicare IM give by:    Date Additional Medicare IM Given:    Additional Medicare Important Message give by:     If discussed at [place] of Stay Meetings, dates discussed:    Additional Comments:  Dessa Phi, RN 07/22/2015, 11:59 AM

## 2015-07-22 NOTE — Care Management Important Message (Signed)
Important Message  Patient Details IM Letter given to Kathy/Case Manager to present to Patient Name: [name] MRN: [id number] Date of Birth: [birth year]   Medicare Important Message Given:  Yes    Camillo Flaming 07/22/2015, 11:41 AMImportant Message  Patient Details  Name: [name] MRN: [id number] Date of Birth: [birth year]   Medicare Important Message Given:  Yes    Camillo Flaming 07/22/2015, 11:41 AM

## 2015-07-24 LAB — CULTURE, BLOOD (ROUTINE X 2)
Culture: NO GROWTH
Culture: NO GROWTH

## 2015-07-26 DIAGNOSIS — Z8701 Personal history of pneumonia (recurrent): Secondary | ICD-10-CM | POA: Diagnosis not present

## 2015-07-26 DIAGNOSIS — Z853 Personal history of malignant neoplasm of breast: Secondary | ICD-10-CM | POA: Diagnosis not present

## 2015-07-26 DIAGNOSIS — I739 Peripheral vascular disease, unspecified: Secondary | ICD-10-CM | POA: Diagnosis not present

## 2015-07-26 DIAGNOSIS — R2681 Unsteadiness on feet: Secondary | ICD-10-CM | POA: Diagnosis not present

## 2015-07-26 DIAGNOSIS — I4891 Unspecified atrial fibrillation: Secondary | ICD-10-CM | POA: Diagnosis not present

## 2015-07-26 DIAGNOSIS — E119 Type 2 diabetes mellitus without complications: Secondary | ICD-10-CM | POA: Diagnosis not present

## 2015-07-26 DIAGNOSIS — I10 Essential (primary) hypertension: Secondary | ICD-10-CM | POA: Diagnosis not present

## 2015-07-26 DIAGNOSIS — Z7902 Long term (current) use of antithrombotics/antiplatelets: Secondary | ICD-10-CM | POA: Diagnosis not present

## 2015-07-26 DIAGNOSIS — Z8541 Personal history of malignant neoplasm of cervix uteri: Secondary | ICD-10-CM | POA: Diagnosis not present

## 2015-07-26 DIAGNOSIS — I5033 Acute on chronic diastolic (congestive) heart failure: Secondary | ICD-10-CM | POA: Diagnosis not present

## 2015-07-26 DIAGNOSIS — J101 Influenza due to other identified influenza virus with other respiratory manifestations: Secondary | ICD-10-CM

## 2015-07-26 DIAGNOSIS — Z8543 Personal history of malignant neoplasm of ovary: Secondary | ICD-10-CM | POA: Diagnosis not present

## 2015-07-26 DIAGNOSIS — Z8551 Personal history of malignant neoplasm of bladder: Secondary | ICD-10-CM | POA: Diagnosis not present

## 2015-07-27 DIAGNOSIS — I5033 Acute on chronic diastolic (congestive) heart failure: Secondary | ICD-10-CM | POA: Diagnosis not present

## 2015-07-27 DIAGNOSIS — I10 Essential (primary) hypertension: Secondary | ICD-10-CM | POA: Diagnosis not present

## 2015-07-27 DIAGNOSIS — E119 Type 2 diabetes mellitus without complications: Secondary | ICD-10-CM | POA: Diagnosis not present

## 2015-07-27 DIAGNOSIS — I4891 Unspecified atrial fibrillation: Secondary | ICD-10-CM | POA: Diagnosis not present

## 2015-07-27 DIAGNOSIS — R2681 Unsteadiness on feet: Secondary | ICD-10-CM | POA: Diagnosis not present

## 2015-07-27 DIAGNOSIS — I739 Peripheral vascular disease, unspecified: Secondary | ICD-10-CM | POA: Diagnosis not present

## 2015-07-29 DIAGNOSIS — I10 Essential (primary) hypertension: Secondary | ICD-10-CM | POA: Diagnosis not present

## 2015-07-29 DIAGNOSIS — R2681 Unsteadiness on feet: Secondary | ICD-10-CM | POA: Diagnosis not present

## 2015-07-29 DIAGNOSIS — I5033 Acute on chronic diastolic (congestive) heart failure: Secondary | ICD-10-CM | POA: Diagnosis not present

## 2015-07-29 DIAGNOSIS — I4891 Unspecified atrial fibrillation: Secondary | ICD-10-CM | POA: Diagnosis not present

## 2015-07-29 DIAGNOSIS — E119 Type 2 diabetes mellitus without complications: Secondary | ICD-10-CM | POA: Diagnosis not present

## 2015-07-29 DIAGNOSIS — I739 Peripheral vascular disease, unspecified: Secondary | ICD-10-CM | POA: Diagnosis not present

## 2015-08-02 DIAGNOSIS — I5033 Acute on chronic diastolic (congestive) heart failure: Secondary | ICD-10-CM | POA: Diagnosis not present

## 2015-08-02 DIAGNOSIS — I10 Essential (primary) hypertension: Secondary | ICD-10-CM | POA: Diagnosis not present

## 2015-08-02 DIAGNOSIS — E119 Type 2 diabetes mellitus without complications: Secondary | ICD-10-CM | POA: Diagnosis not present

## 2015-08-02 DIAGNOSIS — R2681 Unsteadiness on feet: Secondary | ICD-10-CM | POA: Diagnosis not present

## 2015-08-02 DIAGNOSIS — I4891 Unspecified atrial fibrillation: Secondary | ICD-10-CM | POA: Diagnosis not present

## 2015-08-02 DIAGNOSIS — I739 Peripheral vascular disease, unspecified: Secondary | ICD-10-CM | POA: Diagnosis not present

## 2015-08-05 DIAGNOSIS — I739 Peripheral vascular disease, unspecified: Secondary | ICD-10-CM | POA: Diagnosis not present

## 2015-08-05 DIAGNOSIS — E039 Hypothyroidism, unspecified: Secondary | ICD-10-CM | POA: Diagnosis not present

## 2015-08-05 DIAGNOSIS — E7800 Pure hypercholesterolemia, unspecified: Secondary | ICD-10-CM | POA: Diagnosis not present

## 2015-08-05 DIAGNOSIS — H6120 Impacted cerumen, unspecified ear: Secondary | ICD-10-CM | POA: Diagnosis not present

## 2015-08-05 DIAGNOSIS — I4891 Unspecified atrial fibrillation: Secondary | ICD-10-CM | POA: Diagnosis not present

## 2015-08-05 DIAGNOSIS — E118 Type 2 diabetes mellitus with unspecified complications: Secondary | ICD-10-CM | POA: Diagnosis not present

## 2015-08-05 DIAGNOSIS — J189 Pneumonia, unspecified organism: Secondary | ICD-10-CM | POA: Diagnosis not present

## 2015-08-05 DIAGNOSIS — E119 Type 2 diabetes mellitus without complications: Secondary | ICD-10-CM | POA: Diagnosis not present

## 2015-08-05 DIAGNOSIS — R600 Localized edema: Secondary | ICD-10-CM | POA: Diagnosis not present

## 2015-08-05 DIAGNOSIS — L723 Sebaceous cyst: Secondary | ICD-10-CM | POA: Diagnosis not present

## 2015-08-05 DIAGNOSIS — I5020 Unspecified systolic (congestive) heart failure: Secondary | ICD-10-CM | POA: Diagnosis not present

## 2015-08-05 DIAGNOSIS — D510 Vitamin B12 deficiency anemia due to intrinsic factor deficiency: Secondary | ICD-10-CM | POA: Diagnosis not present

## 2015-08-05 DIAGNOSIS — I10 Essential (primary) hypertension: Secondary | ICD-10-CM | POA: Diagnosis not present

## 2015-08-05 DIAGNOSIS — R2681 Unsteadiness on feet: Secondary | ICD-10-CM | POA: Diagnosis not present

## 2015-08-05 DIAGNOSIS — I5033 Acute on chronic diastolic (congestive) heart failure: Secondary | ICD-10-CM | POA: Diagnosis not present

## 2015-08-05 DIAGNOSIS — A4902 Methicillin resistant Staphylococcus aureus infection, unspecified site: Secondary | ICD-10-CM | POA: Diagnosis not present

## 2015-08-06 DIAGNOSIS — I739 Peripheral vascular disease, unspecified: Secondary | ICD-10-CM | POA: Diagnosis not present

## 2015-08-06 DIAGNOSIS — I10 Essential (primary) hypertension: Secondary | ICD-10-CM | POA: Diagnosis not present

## 2015-08-06 DIAGNOSIS — I4891 Unspecified atrial fibrillation: Secondary | ICD-10-CM | POA: Diagnosis not present

## 2015-08-06 DIAGNOSIS — R2681 Unsteadiness on feet: Secondary | ICD-10-CM | POA: Diagnosis not present

## 2015-08-06 DIAGNOSIS — E119 Type 2 diabetes mellitus without complications: Secondary | ICD-10-CM | POA: Diagnosis not present

## 2015-08-06 DIAGNOSIS — I5033 Acute on chronic diastolic (congestive) heart failure: Secondary | ICD-10-CM | POA: Diagnosis not present

## 2015-08-09 ENCOUNTER — Ambulatory Visit: Payer: Medicare Other

## 2015-08-09 DIAGNOSIS — E119 Type 2 diabetes mellitus without complications: Secondary | ICD-10-CM | POA: Diagnosis not present

## 2015-08-09 DIAGNOSIS — I4891 Unspecified atrial fibrillation: Secondary | ICD-10-CM | POA: Diagnosis not present

## 2015-08-09 DIAGNOSIS — I5033 Acute on chronic diastolic (congestive) heart failure: Secondary | ICD-10-CM | POA: Diagnosis not present

## 2015-08-09 DIAGNOSIS — I10 Essential (primary) hypertension: Secondary | ICD-10-CM | POA: Diagnosis not present

## 2015-08-09 DIAGNOSIS — I739 Peripheral vascular disease, unspecified: Secondary | ICD-10-CM | POA: Diagnosis not present

## 2015-08-09 DIAGNOSIS — R2681 Unsteadiness on feet: Secondary | ICD-10-CM | POA: Diagnosis not present

## 2015-08-11 DIAGNOSIS — I739 Peripheral vascular disease, unspecified: Secondary | ICD-10-CM | POA: Diagnosis not present

## 2015-08-11 DIAGNOSIS — R2681 Unsteadiness on feet: Secondary | ICD-10-CM | POA: Diagnosis not present

## 2015-08-11 DIAGNOSIS — I5033 Acute on chronic diastolic (congestive) heart failure: Secondary | ICD-10-CM | POA: Diagnosis not present

## 2015-08-11 DIAGNOSIS — I10 Essential (primary) hypertension: Secondary | ICD-10-CM | POA: Diagnosis not present

## 2015-08-11 DIAGNOSIS — I4891 Unspecified atrial fibrillation: Secondary | ICD-10-CM | POA: Diagnosis not present

## 2015-08-11 DIAGNOSIS — E119 Type 2 diabetes mellitus without complications: Secondary | ICD-10-CM | POA: Diagnosis not present

## 2015-08-18 ENCOUNTER — Encounter: Payer: Self-pay | Admitting: Internal Medicine

## 2015-08-24 ENCOUNTER — Ambulatory Visit: Payer: Medicare Other | Admitting: Internal Medicine

## 2015-08-24 DIAGNOSIS — I4891 Unspecified atrial fibrillation: Secondary | ICD-10-CM | POA: Diagnosis not present

## 2015-08-24 DIAGNOSIS — I5033 Acute on chronic diastolic (congestive) heart failure: Secondary | ICD-10-CM | POA: Diagnosis not present

## 2015-08-24 DIAGNOSIS — E119 Type 2 diabetes mellitus without complications: Secondary | ICD-10-CM | POA: Diagnosis not present

## 2015-08-24 DIAGNOSIS — I10 Essential (primary) hypertension: Secondary | ICD-10-CM | POA: Diagnosis not present

## 2015-08-24 DIAGNOSIS — R2681 Unsteadiness on feet: Secondary | ICD-10-CM | POA: Diagnosis not present

## 2015-08-24 DIAGNOSIS — I739 Peripheral vascular disease, unspecified: Secondary | ICD-10-CM | POA: Diagnosis not present

## 2015-08-30 ENCOUNTER — Ambulatory Visit: Payer: Medicare Other | Admitting: *Deleted

## 2015-08-30 DIAGNOSIS — E0842 Diabetes mellitus due to underlying condition with diabetic polyneuropathy: Secondary | ICD-10-CM

## 2015-08-30 NOTE — Progress Notes (Signed)
Patient ID: [name], female   DOB: August 16, 1919, 82 y.o.   MRN: [id number] Patient presents to be molded and measured for diabetic shoes and inserts.

## 2015-09-06 DIAGNOSIS — I4891 Unspecified atrial fibrillation: Secondary | ICD-10-CM | POA: Diagnosis not present

## 2015-09-06 DIAGNOSIS — I10 Essential (primary) hypertension: Secondary | ICD-10-CM | POA: Diagnosis not present

## 2015-09-06 DIAGNOSIS — I739 Peripheral vascular disease, unspecified: Secondary | ICD-10-CM | POA: Diagnosis not present

## 2015-09-06 DIAGNOSIS — R2681 Unsteadiness on feet: Secondary | ICD-10-CM | POA: Diagnosis not present

## 2015-09-06 DIAGNOSIS — I5033 Acute on chronic diastolic (congestive) heart failure: Secondary | ICD-10-CM | POA: Diagnosis not present

## 2015-09-06 DIAGNOSIS — E119 Type 2 diabetes mellitus without complications: Secondary | ICD-10-CM | POA: Diagnosis not present

## 2015-09-22 DIAGNOSIS — I4891 Unspecified atrial fibrillation: Secondary | ICD-10-CM | POA: Diagnosis not present

## 2015-09-22 DIAGNOSIS — E119 Type 2 diabetes mellitus without complications: Secondary | ICD-10-CM | POA: Diagnosis not present

## 2015-09-22 DIAGNOSIS — R2681 Unsteadiness on feet: Secondary | ICD-10-CM | POA: Diagnosis not present

## 2015-09-22 DIAGNOSIS — I10 Essential (primary) hypertension: Secondary | ICD-10-CM | POA: Diagnosis not present

## 2015-09-22 DIAGNOSIS — I739 Peripheral vascular disease, unspecified: Secondary | ICD-10-CM | POA: Diagnosis not present

## 2015-09-22 DIAGNOSIS — I5033 Acute on chronic diastolic (congestive) heart failure: Secondary | ICD-10-CM | POA: Diagnosis not present

## 2015-09-26 ENCOUNTER — Encounter: Payer: Self-pay | Admitting: Internal Medicine

## 2015-09-26 ENCOUNTER — Ambulatory Visit (INDEPENDENT_AMBULATORY_CARE_PROVIDER_SITE_OTHER): Payer: Medicare Other | Admitting: Internal Medicine

## 2015-09-26 VITALS — BP 124/76 | HR 54 | Ht 60.0 in | Wt 201.0 lb

## 2015-09-26 DIAGNOSIS — I482 Chronic atrial fibrillation: Secondary | ICD-10-CM | POA: Diagnosis not present

## 2015-09-26 DIAGNOSIS — I5032 Chronic diastolic (congestive) heart failure: Secondary | ICD-10-CM | POA: Diagnosis not present

## 2015-09-26 DIAGNOSIS — I4821 Permanent atrial fibrillation: Secondary | ICD-10-CM

## 2015-09-26 DIAGNOSIS — I10 Essential (primary) hypertension: Secondary | ICD-10-CM

## 2015-09-26 NOTE — Patient Instructions (Signed)
Medication Instructions:  Your physician recommends that you continue on your current medications as directed. Please refer to the Current Medication list given to you today.   Labwork: None ordered   Testing/Procedures: None ordered   Follow-Up: Your physician wants you to follow-up in: 6 months with Lori Gerhardt, NP and 12 months with Dr Allred You will receive a reminder letter in the mail two months in advance. If you don't receive a letter, please call our office to schedule the follow-up appointment.   Any Other Special Instructions Will Be Listed Below (If Applicable).     If you need a refill on your cardiac medications before your next appointment, please call your pharmacy.   

## 2015-09-26 NOTE — Progress Notes (Signed)
PCP: [name], MD  The patient presents today for routine cardiology followup.  She has done very well recently.  She was hospitalized 1/17 for influenza and took about 6 weeks to recover from this.  She fell last fall but not since.  Today, she denies symptoms of palpitations, chest pain, shortness of breath, orthopnea, PND, dizziness, presyncope, syncope, or neurologic sequela.   She remains active despite her age.  Her edema is improved with support hose. The patient feels that she is tolerating medications without difficulties and is otherwise without complaint today.   Past Medical History  Diagnosis Date  . Hypertension   . SBO (small bowel obstruction) ([place])   . Permanent atrial fibrillation (HCC)     a. coumadin d/c'd => Pradaxa in 05/2012  . Chronic diastolic heart failure ([place])     a. Echo 5/12: Mild LVH, EF 55-60%, mild AI, mild MR, severe LAE, mild RAE, PASP 31, small pericardial effusion  . Venous insufficiency     chronic LE edema  . HLD (hyperlipidemia)   . OSA (obstructive sleep apnea)   . Hypothyroidism   . Diastolic CHF, chronic ([place]) 11/11/2012    Class 2b-3 2 d echo 5/12 mild LVH EF 55-50%, MILD AI, MILD MR, SEVERE LAE, mild RAE, PASP 31, SMALL PERICRADIAL EFFUSION  . Hx of cervical cancer 11/11/2012  . S/P hysterectomy 11/11/2012  . Hx of bladder cancer 11/11/2012  . SBO (small bowel obstruction) ([place]) 08/25/2014  . Colon cancer ([place])     "polyp" (12/08/2012)  . Breast cancer ([place])   . Cervical cancer ([place])   . Ovarian cancer ([place])   . Bladder cancer ([place])   . H/O ovarian cancer 11/11/2012     MUCINOUS CYST S/P RESECTION 35 YEARS AGO  . DM2 (diabetes mellitus, type 2) (HCC)     TYPE 2   Past Surgical History  Procedure Laterality Date  . Bladder surgery    . Appendectomy    . Vaginal hysterectomy    . Exploratory laparotomy with abdominal mass excision      "21# ovarian tumor; benign" (12/08/2012)  . Cataract extraction w/ intraocular lens  implant,  bilateral    . Mastectomy, radical Left   . Breast lumpectomy Right   . Breast biopsy Bilateral   . I&d extremity Right 12/31/2012    Procedure: IRRIGATION AND DEBRIDEMENT RIGHT KNEE ULCER WITH PLACEMENT OF A CELL AND VAC ;  Surgeon: Theodoro Kos, DO;  Location: [facility]S;  Service: Plastics;  Laterality: Right;    Current Outpatient Prescriptions  Medication Sig Dispense Refill  . apixaban (ELIQUIS) 5 MG TABS tablet Take 1 tablet (5 mg total) by mouth 2 (two) times daily. 180 tablet 3  . bisacodyl (DULCOLAX) 5 MG EC tablet Take 5 mg by mouth daily.     . Cyanocobalamin (VITAMIN B-12 IJ) Inject 1 mL as directed every 30 (thirty) days.     Marland Kitchen diltiazem (TIAZAC) 360 MG 24 hr capsule Take 360 mg by mouth daily.    . ferrous sulfate 325 (65 FE) MG tablet Take 325 mg by mouth daily with breakfast.    . furosemide (LASIX) 40 MG tablet Take 1 tablet (40 mg total) by mouth daily. 30 tablet 0  . levothyroxine (SYNTHROID, LEVOTHROID) 200 MCG tablet Take 200 mcg by mouth daily before breakfast.    . lisinopril (PRINIVIL,ZESTRIL) 5 MG tablet Take 5 mg by mouth daily.    . potassium chloride SA (K-DUR,KLOR-CON) 20 MEQ tablet Take 20 mEq  by mouth daily.    Marland Kitchen senna-docusate (SENOKOT-S) 8.6-50 MG per tablet Take 1 tablet by mouth 2 (two) times daily.    . sodium phosphate (FLEET) 7-19 GM/118ML ENEM Place 133 mLs (1 enema total) rectally daily as needed for severe constipation.  0   No current facility-administered medications for this visit.    Allergies  Allergen Reactions  . Codeine     unknown  . Morphine And Related     sick  . Statins     sick  . Zetia [Ezetimibe] Other (See Comments)    Side effect to strong     Social History   Social History  . Marital Status: Widowed    Spouse Name: N/A  . Number of Children: N/A  . Years of Education: N/A   Occupational History  . Not on file.   Social History Main Topics  . Smoking status: Former Smoker -- 3.00 packs/day for 25 years    Types:  Cigarettes    Quit date: 07/02/1966  . Smokeless tobacco: Never Used  . Alcohol Use: No  . Drug Use: No  . Sexual Activity: No   Other Topics Concern  . Not on file   Social History Narrative     Physical Exam: Filed Vitals:   09/26/15 1152  BP: 124/76  Pulse: 54  Height: 5' (1.524 m)  Weight: 201 lb (91.173 kg)    GEN- The patient is elderly appearing, alert and oriented x 3 today.   Head- normocephalic, atraumatic Eyes-  Sclera clear, conjunctiva pink Ears- hearing intact Oropharynx- clear Neck- supple, flat JVP Lungs- Clear to ausculation bilaterally, normal work of breathing Heart- irregular rate and rhythm, no murmurs, rubs or gallops, PMI not laterally displaced GI- soft, NT, ND, + BS Extremities- no clubbing, cyanosis, 1+ BLE edema with support hose in place Neuro- strength and sensation are intact  Assessment and Plan:  1. Permanent afib Doing well No active bleeding. On eliquis  2. Chronic diastolic dysfunction Stable No change required today She has venous insufficiency which is improved with support hose  3. HTN Stable No change required today  Return in 6 months to see Truitt Merle I will see in a year  Thompson Grayer MD, [facility] 09/26/2015 12:27 PM

## 2015-09-27 ENCOUNTER — Ambulatory Visit: Payer: Medicare Other | Admitting: Podiatry

## 2015-10-11 ENCOUNTER — Ambulatory Visit: Payer: Medicare Other

## 2015-10-18 ENCOUNTER — Ambulatory Visit (INDEPENDENT_AMBULATORY_CARE_PROVIDER_SITE_OTHER): Payer: Medicare Other | Admitting: Podiatry

## 2015-10-18 DIAGNOSIS — M2040 Other hammer toe(s) (acquired), unspecified foot: Secondary | ICD-10-CM | POA: Diagnosis not present

## 2015-10-18 DIAGNOSIS — E1140 Type 2 diabetes mellitus with diabetic neuropathy, unspecified: Secondary | ICD-10-CM

## 2015-10-18 DIAGNOSIS — E0842 Diabetes mellitus due to underlying condition with diabetic polyneuropathy: Secondary | ICD-10-CM | POA: Diagnosis not present

## 2015-10-18 DIAGNOSIS — R0989 Other specified symptoms and signs involving the circulatory and respiratory systems: Secondary | ICD-10-CM

## 2015-10-18 NOTE — Patient Instructions (Signed)

## 2015-10-18 NOTE — Progress Notes (Signed)
Patient ID: [name], female   DOB: [birth year], 82 y.o.   MRN: [id number] Patient presents for diabetic shoe pick up, shoes are tried on for good fit.  Patient received 1 Pair Apex T2400W Stretchable taupe in women's size 8.5 extra wide and 3 pairs custom molded diabetic inserts.  Verbal and written break in and wear instructions given.  Patient will follow up for scheduled routine care.    Subjective: This patient presents today for dispensing of diabetic shoes and custom insoles  Objective: No open skin lesions bilaterally Diabetic shoes 2 and custom molded insoles 6 contour satisfactorily  Assessment: Satisfactory fit of diabetic shoes and custom molded insoles Indications for diabetic shoes Type 2 diabetes with neuropathy Hammertoe deformity Decrease pedal pulses  Plan:  Dispensed 1 Pair Apex T2400W Stretchable taupe in women's size 8.5 extra wide and 3 pairs custom molded diabetic inserts.  Verbal and written break in and wear instructions given.  Patient will follow up for scheduled routine care.

## 2015-11-23 DIAGNOSIS — M25579 Pain in unspecified ankle and joints of unspecified foot: Secondary | ICD-10-CM | POA: Diagnosis not present

## 2015-11-29 ENCOUNTER — Ambulatory Visit (INDEPENDENT_AMBULATORY_CARE_PROVIDER_SITE_OTHER): Payer: Medicare Other | Admitting: Podiatry

## 2015-11-29 ENCOUNTER — Encounter: Payer: Self-pay | Admitting: Podiatry

## 2015-11-29 DIAGNOSIS — B351 Tinea unguium: Secondary | ICD-10-CM

## 2015-11-29 DIAGNOSIS — M79676 Pain in unspecified toe(s): Secondary | ICD-10-CM

## 2015-11-29 NOTE — Patient Instructions (Signed)
Return every 3 months for debridement of toenails [name] DPM   Diabetes and Foot Care Diabetes may cause you to have problems because of poor blood supply (circulation) to your feet and legs. This may cause the skin on your feet to become thinner, break easier, and heal more slowly. Your skin may become dry, and the skin may peel and crack. You may also have nerve damage in your legs and feet causing decreased feeling in them. You may not notice minor injuries to your feet that could lead to infections or more serious problems. Taking care of your feet is one of the most important things you can do for yourself.  HOME CARE INSTRUCTIONS  Wear shoes at all times, even in the house. Do not go barefoot. Bare feet are easily injured.  Check your feet daily for blisters, cuts, and redness. If you cannot see the bottom of your feet, use a mirror or ask someone for help.  Wash your feet with warm water (do not use hot water) and mild soap. Then pat your feet and the areas between your toes until they are completely dry. Do not soak your feet as this can dry your skin.  Apply a moisturizing lotion or petroleum jelly (that does not contain alcohol and is unscented) to the skin on your feet and to dry, brittle toenails. Do not apply lotion between your toes.  Trim your toenails straight across. Do not dig under them or around the cuticle. File the edges of your nails with an emery board or nail file.  Do not cut corns or calluses or try to remove them with medicine.  Wear clean socks or stockings every day. Make sure they are not too tight. Do not wear knee-high stockings since they may decrease blood flow to your legs.  Wear shoes that fit properly and have enough cushioning. To break in new shoes, wear them for just a few hours a day. This prevents you from injuring your feet. Always look in your shoes before you put them on to be sure there are no objects inside.  Do not cross your legs.  This may decrease the blood flow to your feet.  If you find a minor scrape, cut, or break in the skin on your feet, keep it and the skin around it clean and dry. These areas may be cleansed with mild soap and water. Do not cleanse the area with peroxide, alcohol, or iodine.  When you remove an adhesive bandage, be sure not to damage the skin around it.  If you have a wound, look at it several times a day to make sure it is healing.  Do not use heating pads or hot water bottles. They may burn your skin. If you have lost feeling in your feet or legs, you may not know it is happening until it is too late.  Make sure your health care provider performs a complete foot exam at least annually or more often if you have foot problems. Report any cuts, sores, or bruises to your health care provider immediately. SEEK MEDICAL CARE IF:   You have an injury that is not healing.  You have cuts or breaks in the skin.  You have an ingrown nail.  You notice redness on your legs or feet.  You feel burning or tingling in your legs or feet.  You have pain or cramps in your legs and feet.  Your legs or feet are numb.  Your feet  always feel cold. SEEK IMMEDIATE MEDICAL CARE IF:   There is increasing redness, swelling, or pain in or around a wound.  There is a red line that goes up your leg.  Pus is coming from a wound.  You develop a fever or as directed by your health care provider.  You notice a bad smell coming from an ulcer or wound.   This information is not intended to replace advice given to you by your health care provider. Make sure you discuss any questions you have with your health care provider.   Document Released: 06/15/2000 Document Revised: 02/18/2013 Document Reviewed: 11/25/2012 Elsevier Interactive Patient Education [employer].

## 2015-11-29 NOTE — Progress Notes (Signed)
Patient ID: [name], female   DOB: [birth year], 82 y.o.   MRN: [id number]   Subjective: This patient presents for scheduled visit stating that her toenails are uncomfortable when she wears shoes as requesting debridement of her toenails.  Objective Pleasant patient appears orientated 3 able to answer questions Peripheral edema bilaterally DP pulses 2/4 bilaterally PT pulses nonpalpable bilaterally Capillary reflex immediate bilaterally  Neurological: Sensation to 10 g monofilament wire intact 1/5 right and 0/5 left Vibratory sensation nonreactive bilaterally Ankle reflexes equal and reactive bilaterally  Dermatological: Varicosities dorsal foot and ankle bilaterally The toenails are elongated, discolored, incurvated, deformed and tender direct palpation 6-10  Musculoskeletal: Adductovarus fifth toes bilaterally  Assessment: Type II diabetic with peripheral neuropathy Peripheral edema bilaterally Absent posterior tibial pulses bilaterally Symptomatic onychomycoses 6-10  Plan: Debridement toenails 6-10 mechanically electronically without any bleeding  Reappoint 3 months and return upon certification for diabetic shoes for measuring

## 2015-12-05 DIAGNOSIS — H26492 Other secondary cataract, left eye: Secondary | ICD-10-CM | POA: Diagnosis not present

## 2015-12-05 DIAGNOSIS — E119 Type 2 diabetes mellitus without complications: Secondary | ICD-10-CM | POA: Diagnosis not present

## 2015-12-05 DIAGNOSIS — I509 Heart failure, unspecified: Secondary | ICD-10-CM | POA: Diagnosis not present

## 2015-12-05 DIAGNOSIS — E039 Hypothyroidism, unspecified: Secondary | ICD-10-CM | POA: Diagnosis not present

## 2015-12-05 DIAGNOSIS — I5020 Unspecified systolic (congestive) heart failure: Secondary | ICD-10-CM | POA: Diagnosis not present

## 2015-12-05 DIAGNOSIS — Z961 Presence of intraocular lens: Secondary | ICD-10-CM | POA: Diagnosis not present

## 2015-12-14 DIAGNOSIS — H26492 Other secondary cataract, left eye: Secondary | ICD-10-CM | POA: Diagnosis not present

## 2016-02-02 DIAGNOSIS — I10 Essential (primary) hypertension: Secondary | ICD-10-CM | POA: Diagnosis not present

## 2016-02-02 DIAGNOSIS — I509 Heart failure, unspecified: Secondary | ICD-10-CM | POA: Diagnosis not present

## 2016-02-02 DIAGNOSIS — R609 Edema, unspecified: Secondary | ICD-10-CM | POA: Diagnosis not present

## 2016-02-02 DIAGNOSIS — E118 Type 2 diabetes mellitus with unspecified complications: Secondary | ICD-10-CM | POA: Diagnosis not present

## 2016-03-06 ENCOUNTER — Ambulatory Visit: Payer: Medicare Other | Admitting: Podiatry

## 2016-03-28 ENCOUNTER — Ambulatory Visit: Payer: Medicare Other | Admitting: Podiatry

## 2016-04-10 ENCOUNTER — Ambulatory Visit (INDEPENDENT_AMBULATORY_CARE_PROVIDER_SITE_OTHER): Payer: Medicare Other | Admitting: Podiatry

## 2016-04-10 DIAGNOSIS — M79676 Pain in unspecified toe(s): Secondary | ICD-10-CM

## 2016-04-10 DIAGNOSIS — B351 Tinea unguium: Secondary | ICD-10-CM | POA: Diagnosis not present

## 2016-04-10 NOTE — Progress Notes (Signed)
Patient ID: [name], female   DOB: 1919-08-02, 82 y.o.   MRN: [id number]    Subjective: This patient presents for scheduled visit stating that her toenails are uncomfortable when she wears shoes as requesting debridement of her toenails.  Objective Pleasant patient appears orientated 3 able to answer questions Peripheral edema bilaterally DP pulses 2/4 bilaterally PT pulses nonpalpable bilaterally Capillary reflex immediate bilaterally  Neurological: Sensation to 10 g monofilament wire intact 1/5 right and 0/5 left Vibratory sensation nonreactive bilaterally Ankle reflexes equal and reactive bilaterally  Dermatological: Varicosities dorsal foot and ankle bilaterally The toenails are elongated, discolored, incurvated, deformed and tender direct palpation 6-10  Musculoskeletal: Adductovarus fifth toes bilaterally  Assessment: Type II diabetic with peripheral neuropathy Peripheral edema bilaterally Absent posterior tibial pulses bilaterally Symptomatic onychomycoses 6-10  Plan: Debridement toenails 6-10 mechanically electronically without any bleeding  Reappoint 3 months

## 2016-04-10 NOTE — Patient Instructions (Signed)
Diabetes and Foot Care Diabetes may cause you to have problems because of poor blood supply (circulation) to your feet and legs. This may cause the skin on your feet to become thinner, break easier, and heal more slowly. Your skin may become dry, and the skin may peel and crack. You may also have nerve damage in your legs and feet causing decreased feeling in them. You may not notice minor injuries to your feet that could lead to infections or more serious problems. Taking care of your feet is one of the most important things you can do for yourself.  HOME CARE INSTRUCTIONS  Wear shoes at all times, even in the house. Do not go barefoot. Bare feet are easily injured.  Check your feet daily for blisters, cuts, and redness. If you cannot see the bottom of your feet, use a mirror or ask someone for help.  Wash your feet with warm water (do not use hot water) and mild soap. Then pat your feet and the areas between your toes until they are completely dry. Do not soak your feet as this can dry your skin.  Apply a moisturizing lotion or petroleum jelly (that does not contain alcohol and is unscented) to the skin on your feet and to dry, brittle toenails. Do not apply lotion between your toes.  Trim your toenails straight across. Do not dig under them or around the cuticle. File the edges of your nails with an emery board or nail file.  Do not cut corns or calluses or try to remove them with medicine.  Wear clean socks or stockings every day. Make sure they are not too tight. Do not wear knee-high stockings since they may decrease blood flow to your legs.  Wear shoes that fit properly and have enough cushioning. To break in new shoes, wear them for just a few hours a day. This prevents you from injuring your feet. Always look in your shoes before you put them on to be sure there are no objects inside.  Do not cross your legs. This may decrease the blood flow to your feet.  If you find a minor scrape,  cut, or break in the skin on your feet, keep it and the skin around it clean and dry. These areas may be cleansed with mild soap and water. Do not cleanse the area with peroxide, alcohol, or iodine.  When you remove an adhesive bandage, be sure not to damage the skin around it.  If you have a wound, look at it several times a day to make sure it is healing.  Do not use heating pads or hot water bottles. They may burn your skin. If you have lost feeling in your feet or legs, you may not know it is happening until it is too late.  Make sure your health care provider performs a complete foot exam at least annually or more often if you have foot problems. Report any cuts, sores, or bruises to your health care provider immediately. SEEK MEDICAL CARE IF:   You have an injury that is not healing.  You have cuts or breaks in the skin.  You have an ingrown nail.  You notice redness on your legs or feet.  You feel burning or tingling in your legs or feet.  You have pain or cramps in your legs and feet.  Your legs or feet are numb.  Your feet always feel cold. SEEK IMMEDIATE MEDICAL CARE IF:   There is increasing redness,   swelling, or pain in or around a wound.  There is a red line that goes up your leg.  Pus is coming from a wound.  You develop a fever or as directed by your health care provider.  You notice a bad smell coming from an ulcer or wound.   This information is not intended to replace advice given to you by your health care provider. Make sure you discuss any questions you have with your health care provider.   Document Released: 06/15/2000 Document Revised: 02/18/2013 Document Reviewed: 11/25/2012 Elsevier Interactive Patient Education 2016 Elsevier Inc.  

## 2016-04-12 ENCOUNTER — Ambulatory Visit (INDEPENDENT_AMBULATORY_CARE_PROVIDER_SITE_OTHER): Payer: Medicare Other | Admitting: Internal Medicine

## 2016-04-12 ENCOUNTER — Telehealth: Payer: Self-pay | Admitting: Internal Medicine

## 2016-04-12 ENCOUNTER — Encounter: Payer: Self-pay | Admitting: Internal Medicine

## 2016-04-12 VITALS — BP 162/98 | HR 91 | Ht 61.0 in | Wt 219.0 lb

## 2016-04-12 DIAGNOSIS — I482 Chronic atrial fibrillation: Secondary | ICD-10-CM | POA: Diagnosis not present

## 2016-04-12 DIAGNOSIS — I10 Essential (primary) hypertension: Secondary | ICD-10-CM

## 2016-04-12 DIAGNOSIS — I5032 Chronic diastolic (congestive) heart failure: Secondary | ICD-10-CM | POA: Diagnosis not present

## 2016-04-12 DIAGNOSIS — I4821 Permanent atrial fibrillation: Secondary | ICD-10-CM

## 2016-04-12 NOTE — Progress Notes (Signed)
PCP: [name], MD  The patient presents today for routine cardiology followup.  She has done very well recently.   Today, she denies symptoms of palpitations, chest pain, shortness of breath, orthopnea, PND, dizziness, presyncope, syncope, or neurologic sequela.   She remains active despite her age.  Her edema is improved with support hose. The patient feels that she is tolerating medications without difficulties and is otherwise without complaint today.   Past Medical History:  Diagnosis Date  . Bladder cancer ([place])   . Breast cancer ([place])   . Cervical cancer ([place])   . Chronic diastolic heart failure ([place])    a. Echo 5/12: Mild LVH, EF 55-60%, mild AI, mild MR, severe LAE, mild RAE, PASP 31, small pericardial effusion  . Colon cancer ([place])    "polyp" (12/08/2012)  . Diastolic CHF, chronic ([place]) 11/11/2012   Class 2b-3 2 d echo 5/12 mild LVH EF 55-50%, MILD AI, MILD MR, SEVERE LAE, mild RAE, PASP 31, SMALL PERICRADIAL EFFUSION  . DM2 (diabetes mellitus, type 2) ([place])    TYPE 2  . H/O ovarian cancer 11/11/2012    MUCINOUS CYST S/P RESECTION 35 YEARS AGO  . HLD (hyperlipidemia)   . Hx of bladder cancer 11/11/2012  . Hx of cervical cancer 11/11/2012  . Hypertension   . Hypothyroidism   . OSA (obstructive sleep apnea)   . Ovarian cancer ([place])   . Permanent atrial fibrillation (H[facility])    a. coumadin d/c'd => Pradaxa in 05/2012  . S/P hysterectomy 11/11/2012  . SBO (small bowel obstruction)   . SBO (small bowel obstruction) 08/25/2014  . Venous insufficiency    chronic LE edema   Past Surgical History:  Procedure Laterality Date  . APPENDECTOMY    . BLADDER SURGERY    . BREAST BIOPSY Bilateral   . BREAST LUMPECTOMY Right   . CATARACT EXTRACTION W/ INTRAOCULAR LENS  IMPLANT, BILATERAL    . EXPLORATORY LAPAROTOMY WITH ABDOMINAL MASS EXCISION     "21# ovarian tumor; benign" (12/08/2012)  . I&D EXTREMITY Right 12/31/2012   Procedure: IRRIGATION AND DEBRIDEMENT RIGHT KNEE ULCER WITH  PLACEMENT OF A CELL AND VAC ;  Surgeon: Theodoro Kos, DO;  Location: [facility];  Service: Plastics;  Laterality: Right;  . MASTECTOMY, RADICAL Left   . VAGINAL HYSTERECTOMY      Current Outpatient Prescriptions  Medication Sig Dispense Refill  . apixaban (ELIQUIS) 5 MG TABS tablet Take 1 tablet (5 mg total) by mouth 2 (two) times daily. 180 tablet 3  . bisacodyl (DULCOLAX) 5 MG EC tablet Take 5 mg by mouth daily.     . Cyanocobalamin (VITAMIN B-12 IJ) Inject 1 mL as directed every 30 (thirty) days.     Marland Kitchen diltiazem (TIAZAC) 360 MG 24 hr capsule Take 360 mg by mouth daily.    . ferrous sulfate 325 (65 FE) MG tablet Take 325 mg by mouth daily with breakfast.    . furosemide (LASIX) 40 MG tablet Take 40 mg by mouth daily. Take one extra tablet daily as needed for weight gain of >2 lbs in 24 hour period    . levothyroxine (SYNTHROID, LEVOTHROID) 125 MCG tablet Take 125 mcg by mouth daily before breakfast.    . lisinopril (PRINIVIL,ZESTRIL) 5 MG tablet Take 5 mg by mouth daily.    . potassium chloride SA (K-DUR,KLOR-CON) 20 MEQ tablet Take 20 mEq by mouth daily.    Marland Kitchen senna-docusate (SENOKOT-S) 8.6-50 MG per tablet Take 1 tablet by mouth 2 (two)  times daily.    . sodium phosphate (FLEET) 7-19 GM/118ML ENEM Place 133 mLs (1 enema total) rectally daily as needed for severe constipation.  0   No current facility-administered medications for this visit.     Allergies  Allergen Reactions  . Codeine     unknown  . Morphine And Related     sick  . Statins     sick  . Zetia [Ezetimibe] Other (See Comments)    Side effect too strong     Social History   Social History  . Marital status: Widowed    Spouse name: N/A  . Number of children: N/A  . Years of education: N/A   Occupational History  . Not on file.   Social History Main Topics  . Smoking status: Former Smoker    Packs/day: 3.00    Years: 25.00    Types: Cigarettes    Quit date: 07/02/1966  . Smokeless tobacco: Never Used  .  Alcohol use No  . Drug use: No  . Sexual activity: No   Other Topics Concern  . Not on file   Social History Narrative  . No narrative on file     Physical Exam: Vitals:   04/12/16 1050  BP: (!) 162/98  Pulse: 91  Weight: 219 lb (99.3 kg)  Height: 5\' 1"  (1.549 m)    GEN- The patient is elderly appearing, alert and oriented x 3 today.   Head- normocephalic, atraumatic Eyes-  Sclera clear, conjunctiva pink Ears- hearing intact Oropharynx- clear Neck- supple, flat JVP Lungs- Clear to ausculation bilaterally, normal work of breathing Heart- irregular rate and rhythm, no murmurs, rubs or gallops, PMI not laterally displaced GI- soft, NT, ND, + BS Extremities- no clubbing, cyanosis, 1+ BLE edema with support hose in place Neuro- strength and sensation are intact  Assessment and Plan:  1. Permanent afib Doing well No active bleeding. On eliquis Labs from primary care reviewed  2. Chronic diastolic dysfunction Stable No change required today She has venous insufficiency which is improved with support hose  3. HTN Elevated today She reports good BP control at home and is not ready to make any changes today Consider increasing lisinopril if BP remains elevated  Return in 6 months to see EP PA-C I will see in a year  Thompson Grayer MD, [facility] 04/12/2016 11:19 AM

## 2016-04-12 NOTE — Telephone Encounter (Signed)
New Message  Pt voiced it state she needs compression garments but it doesn't state what kind of compression and etc.   Please f/u with pt

## 2016-04-12 NOTE — Telephone Encounter (Signed)
Spoke with Seth Bake and let her know to order the same at what patient is currently wearing. She will do so

## 2016-04-12 NOTE — Patient Instructions (Signed)
Medication Instructions:  Your physician recommends that you continue on your current medications as directed. Please refer to the Current Medication list given to you today.   Labwork: None ordered   Testing/Procedures: None ordered   Follow-Up: Your physician wants you to follow-up in: 6 months with Renee Ursuy, PA and 12 months with Dr Allred You will receive a reminder letter in the mail two months in advance. If you don't receive a letter, please call our office to schedule the follow-up appointment.   Any Other Special Instructions Will Be Listed Below (If Applicable).     If you need a refill on your cardiac medications before your next appointment, please call your pharmacy.   

## 2016-04-17 DIAGNOSIS — Z23 Encounter for immunization: Secondary | ICD-10-CM | POA: Diagnosis not present

## 2016-06-07 DIAGNOSIS — H4922 Sixth [abducent] nerve palsy, left eye: Secondary | ICD-10-CM | POA: Diagnosis not present

## 2016-06-07 DIAGNOSIS — H532 Diplopia: Secondary | ICD-10-CM | POA: Diagnosis not present

## 2016-06-18 ENCOUNTER — Telehealth: Payer: Self-pay | Admitting: Internal Medicine

## 2016-06-18 NOTE — Telephone Encounter (Signed)
[name] from [facility]) is calling  In reference to compression socks that Dr. Rayann Heman wrote for the patient. [name] does not wear that particular type and they need to have the order d/c.d Any Questions please call. Thanks.

## 2016-06-27 ENCOUNTER — Telehealth: Payer: Self-pay | Admitting: Internal Medicine

## 2016-06-27 NOTE — Telephone Encounter (Signed)
F/u Message  Seth Bake call from [place] to f/u on order for pt compression socks.. Please call back to discuss

## 2016-06-27 NOTE — Telephone Encounter (Signed)
D/c JUZO hose   Fax to [place]

## 2016-07-23 DIAGNOSIS — L03119 Cellulitis of unspecified part of limb: Secondary | ICD-10-CM | POA: Diagnosis not present

## 2016-07-25 DIAGNOSIS — L03116 Cellulitis of left lower limb: Secondary | ICD-10-CM | POA: Diagnosis not present

## 2016-07-27 DIAGNOSIS — R238 Other skin changes: Secondary | ICD-10-CM | POA: Diagnosis not present

## 2016-07-27 DIAGNOSIS — I5033 Acute on chronic diastolic (congestive) heart failure: Secondary | ICD-10-CM | POA: Diagnosis not present

## 2016-07-27 DIAGNOSIS — L03115 Cellulitis of right lower limb: Secondary | ICD-10-CM | POA: Diagnosis not present

## 2016-07-27 DIAGNOSIS — D649 Anemia, unspecified: Secondary | ICD-10-CM | POA: Diagnosis not present

## 2016-07-27 DIAGNOSIS — Z7901 Long term (current) use of anticoagulants: Secondary | ICD-10-CM | POA: Diagnosis not present

## 2016-07-27 DIAGNOSIS — I4891 Unspecified atrial fibrillation: Secondary | ICD-10-CM | POA: Diagnosis not present

## 2016-07-27 DIAGNOSIS — G4733 Obstructive sleep apnea (adult) (pediatric): Secondary | ICD-10-CM | POA: Diagnosis not present

## 2016-07-27 DIAGNOSIS — Z4800 Encounter for change or removal of nonsurgical wound dressing: Secondary | ICD-10-CM | POA: Diagnosis not present

## 2016-07-27 DIAGNOSIS — Z87891 Personal history of nicotine dependence: Secondary | ICD-10-CM | POA: Diagnosis not present

## 2016-07-27 DIAGNOSIS — I110 Hypertensive heart disease with heart failure: Secondary | ICD-10-CM | POA: Diagnosis not present

## 2016-07-27 DIAGNOSIS — L03116 Cellulitis of left lower limb: Secondary | ICD-10-CM | POA: Diagnosis not present

## 2016-07-28 DIAGNOSIS — L03115 Cellulitis of right lower limb: Secondary | ICD-10-CM | POA: Diagnosis not present

## 2016-07-28 DIAGNOSIS — I4891 Unspecified atrial fibrillation: Secondary | ICD-10-CM | POA: Diagnosis not present

## 2016-07-28 DIAGNOSIS — I110 Hypertensive heart disease with heart failure: Secondary | ICD-10-CM | POA: Diagnosis not present

## 2016-07-28 DIAGNOSIS — L03116 Cellulitis of left lower limb: Secondary | ICD-10-CM | POA: Diagnosis not present

## 2016-07-28 DIAGNOSIS — R238 Other skin changes: Secondary | ICD-10-CM | POA: Diagnosis not present

## 2016-07-28 DIAGNOSIS — I5033 Acute on chronic diastolic (congestive) heart failure: Secondary | ICD-10-CM | POA: Diagnosis not present

## 2016-07-30 ENCOUNTER — Emergency Department (HOSPITAL_COMMUNITY): Payer: Medicare Other

## 2016-07-30 ENCOUNTER — Encounter (HOSPITAL_COMMUNITY): Payer: Self-pay | Admitting: *Deleted

## 2016-07-30 ENCOUNTER — Inpatient Hospital Stay (HOSPITAL_COMMUNITY)
Admission: EM | Admit: 2016-07-30 | Discharge: 2016-08-08 | DRG: 291 | Disposition: A | Discharge status: Skilled Nursing Facility | Payer: Medicare Other | Attending: Internal Medicine | Admitting: Internal Medicine

## 2016-07-30 DIAGNOSIS — I482 Chronic atrial fibrillation: Secondary | ICD-10-CM | POA: Diagnosis present

## 2016-07-30 DIAGNOSIS — Z8543 Personal history of malignant neoplasm of ovary: Secondary | ICD-10-CM

## 2016-07-30 DIAGNOSIS — J9601 Acute respiratory failure with hypoxia: Secondary | ICD-10-CM | POA: Diagnosis present

## 2016-07-30 DIAGNOSIS — Z87891 Personal history of nicotine dependence: Secondary | ICD-10-CM

## 2016-07-30 DIAGNOSIS — E119 Type 2 diabetes mellitus without complications: Secondary | ICD-10-CM

## 2016-07-30 DIAGNOSIS — E039 Hypothyroidism, unspecified: Secondary | ICD-10-CM | POA: Diagnosis present

## 2016-07-30 DIAGNOSIS — R062 Wheezing: Secondary | ICD-10-CM

## 2016-07-30 DIAGNOSIS — Z7189 Other specified counseling: Secondary | ICD-10-CM

## 2016-07-30 DIAGNOSIS — I480 Paroxysmal atrial fibrillation: Secondary | ICD-10-CM

## 2016-07-30 DIAGNOSIS — I5043 Acute on chronic combined systolic (congestive) and diastolic (congestive) heart failure: Secondary | ICD-10-CM | POA: Diagnosis present

## 2016-07-30 DIAGNOSIS — R0602 Shortness of breath: Secondary | ICD-10-CM

## 2016-07-30 DIAGNOSIS — I272 Pulmonary hypertension, unspecified: Secondary | ICD-10-CM | POA: Diagnosis present

## 2016-07-30 DIAGNOSIS — I110 Hypertensive heart disease with heart failure: Secondary | ICD-10-CM | POA: Diagnosis not present

## 2016-07-30 DIAGNOSIS — I350 Nonrheumatic aortic (valve) stenosis: Secondary | ICD-10-CM | POA: Diagnosis present

## 2016-07-30 DIAGNOSIS — G4733 Obstructive sleep apnea (adult) (pediatric): Secondary | ICD-10-CM | POA: Diagnosis present

## 2016-07-30 DIAGNOSIS — I313 Pericardial effusion (noninflammatory): Secondary | ICD-10-CM | POA: Diagnosis present

## 2016-07-30 DIAGNOSIS — Z7901 Long term (current) use of anticoagulants: Secondary | ICD-10-CM

## 2016-07-30 DIAGNOSIS — K56609 Unspecified intestinal obstruction, unspecified as to partial versus complete obstruction: Secondary | ICD-10-CM | POA: Diagnosis present

## 2016-07-30 DIAGNOSIS — E876 Hypokalemia: Secondary | ICD-10-CM | POA: Diagnosis present

## 2016-07-30 DIAGNOSIS — R600 Localized edema: Secondary | ICD-10-CM | POA: Diagnosis present

## 2016-07-30 DIAGNOSIS — J9811 Atelectasis: Secondary | ICD-10-CM

## 2016-07-30 DIAGNOSIS — L89322 Pressure ulcer of left buttock, stage 2: Secondary | ICD-10-CM | POA: Diagnosis present

## 2016-07-30 DIAGNOSIS — Z853 Personal history of malignant neoplasm of breast: Secondary | ICD-10-CM

## 2016-07-30 DIAGNOSIS — Z79899 Other long term (current) drug therapy: Secondary | ICD-10-CM

## 2016-07-30 DIAGNOSIS — R001 Bradycardia, unspecified: Secondary | ICD-10-CM

## 2016-07-30 DIAGNOSIS — E118 Type 2 diabetes mellitus with unspecified complications: Secondary | ICD-10-CM

## 2016-07-30 DIAGNOSIS — J189 Pneumonia, unspecified organism: Secondary | ICD-10-CM | POA: Diagnosis not present

## 2016-07-30 DIAGNOSIS — Z8551 Personal history of malignant neoplasm of bladder: Secondary | ICD-10-CM

## 2016-07-30 DIAGNOSIS — Y95 Nosocomial condition: Secondary | ICD-10-CM | POA: Diagnosis present

## 2016-07-30 DIAGNOSIS — L97909 Non-pressure chronic ulcer of unspecified part of unspecified lower leg with unspecified severity: Secondary | ICD-10-CM

## 2016-07-30 DIAGNOSIS — Z8541 Personal history of malignant neoplasm of cervix uteri: Secondary | ICD-10-CM

## 2016-07-30 DIAGNOSIS — R0600 Dyspnea, unspecified: Secondary | ICD-10-CM

## 2016-07-30 DIAGNOSIS — I83009 Varicose veins of unspecified lower extremity with ulcer of unspecified site: Secondary | ICD-10-CM | POA: Insufficient documentation

## 2016-07-30 DIAGNOSIS — I872 Venous insufficiency (chronic) (peripheral): Secondary | ICD-10-CM | POA: Diagnosis present

## 2016-07-30 DIAGNOSIS — R069 Unspecified abnormalities of breathing: Secondary | ICD-10-CM | POA: Diagnosis not present

## 2016-07-30 DIAGNOSIS — I10 Essential (primary) hypertension: Secondary | ICD-10-CM | POA: Diagnosis present

## 2016-07-30 DIAGNOSIS — Z85038 Personal history of other malignant neoplasm of large intestine: Secondary | ICD-10-CM

## 2016-07-30 DIAGNOSIS — R05 Cough: Secondary | ICD-10-CM | POA: Diagnosis not present

## 2016-07-30 DIAGNOSIS — I4821 Permanent atrial fibrillation: Secondary | ICD-10-CM | POA: Diagnosis present

## 2016-07-30 DIAGNOSIS — E785 Hyperlipidemia, unspecified: Secondary | ICD-10-CM | POA: Diagnosis present

## 2016-07-30 LAB — COMPREHENSIVE METABOLIC PANEL
ALBUMIN: 3.7 g/dL (ref 3.5–5.0)
ALT: 11 U/L — AB (ref 14–54)
AST: 22 U/L (ref 15–41)
Alkaline Phosphatase: 47 U/L (ref 38–126)
Anion gap: 12 (ref 5–15)
BUN: 11 mg/dL (ref 6–20)
CHLORIDE: 102 mmol/L (ref 101–111)
CO2: 27 mmol/L (ref 22–32)
Calcium: 8.5 mg/dL — ABNORMAL LOW (ref 8.9–10.3)
Creatinine, Ser: 0.78 mg/dL (ref 0.44–1.00)
GFR calc Af Amer: >60 mL/min (ref >60)
GFR calc non Af Amer: >60 mL/min (ref >60)
GLUCOSE: 196 mg/dL — AB (ref 65–99)
Potassium: 3.2 mmol/L — ABNORMAL LOW (ref 3.5–5.1)
SODIUM: 141 mmol/L (ref 135–145)
Total Bilirubin: 0.5 mg/dL (ref 0.3–1.2)
Total Protein: 6.2 g/dL — ABNORMAL LOW (ref 6.5–8.1)

## 2016-07-30 LAB — CBC WITH DIFFERENTIAL/PLATELET
Basophils Absolute: 0 10*3/uL (ref 0.0–0.1)
Basophils Relative: 0 %
EOS ABS: 0 10*3/uL (ref 0.0–0.7)
Eosinophils Relative: 0 %
HCT: 43.4 % (ref 36.0–46.0)
Hemoglobin: 13.8 g/dL (ref 12.0–15.0)
LYMPHS ABS: 0.4 10*3/uL — AB (ref 0.7–4.0)
LYMPHS PCT: 7 %
MCH: 30 pg (ref 26.0–34.0)
MCHC: 31.8 g/dL (ref 30.0–36.0)
MCV: 94.3 fL (ref 78.0–100.0)
MONOS PCT: 1 %
Monocytes Absolute: 0.1 10*3/uL (ref 0.1–1.0)
Neutro Abs: 5 10*3/uL (ref 1.7–7.7)
Neutrophils Relative %: 91 %
PLATELETS: 118 10*3/uL — AB (ref 150–400)
RBC: 4.6 MIL/uL (ref 3.87–5.11)
RDW: 15.8 % — ABNORMAL HIGH (ref 11.5–15.5)
WBC: 5.5 10*3/uL (ref 4.0–10.5)

## 2016-07-30 LAB — I-STAT CG4 LACTIC ACID, ED: LACTIC ACID, VENOUS: 1.73 mmol/L (ref 0.5–1.9)

## 2016-07-30 LAB — BRAIN NATRIURETIC PEPTIDE: B Natriuretic Peptide: 184.2 pg/mL — ABNORMAL HIGH (ref 0.0–100.0)

## 2016-07-30 LAB — GLUCOSE, CAPILLARY: Glucose-Capillary: 199 mg/dL — ABNORMAL HIGH (ref 65–99)

## 2016-07-30 MED ORDER — DILTIAZEM HCL ER COATED BEADS 240 MG PO CP24
360.0000 mg | ORAL_CAPSULE | Freq: Every day | ORAL | Status: DC
  Filled 2016-07-30: qty 1

## 2016-07-30 MED ORDER — DEXTROSE 5 % IV SOLN: 1.0000 g | INTRAVENOUS | Status: DC

## 2016-07-30 MED ORDER — DEXTROSE 5 % IV SOLN: 500.0000 mg | Freq: Once | INTRAVENOUS | Status: DC

## 2016-07-30 MED ORDER — SODIUM CHLORIDE 0.9% FLUSH
3.0000 mL | Freq: Two times a day (BID) | INTRAVENOUS | Status: DC
  Administered 2016-07-30 – 2016-08-01 (×4): 3 mL via INTRAVENOUS
  Administered 2016-08-03 – 2016-08-05 (×2): 10 mL via INTRAVENOUS
  Administered 2016-08-06 – 2016-08-08 (×3): 3 mL via INTRAVENOUS

## 2016-07-30 MED ORDER — VANCOMYCIN HCL 10 G IV SOLR
1250.0000 mg | Freq: Once | INTRAVENOUS | Status: AC
  Administered 2016-07-30: 1250 mg via INTRAVENOUS
  Filled 2016-07-30: qty 1250

## 2016-07-30 MED ORDER — ONDANSETRON HCL 4 MG/2ML IJ SOLN: 4.0000 mg | Freq: Four times a day (QID) | INTRAMUSCULAR | Status: DC | PRN

## 2016-07-30 MED ORDER — INSULIN ASPART 100 UNIT/ML ~~LOC~~ SOLN
0.0000 [IU] | Freq: Three times a day (TID) | SUBCUTANEOUS | Status: DC
  Administered 2016-07-31 – 2016-08-05 (×3): 2 [IU] via SUBCUTANEOUS

## 2016-07-30 MED ORDER — IPRATROPIUM-ALBUTEROL 0.5-2.5 (3) MG/3ML IN SOLN
3.0000 mL | Freq: Three times a day (TID) | RESPIRATORY_TRACT | Status: DC
  Administered 2016-07-31 – 2016-08-06 (×18): 3 mL via RESPIRATORY_TRACT
  Filled 2016-07-30 (×18): qty 3

## 2016-07-30 MED ORDER — DEXTROSE 5 % IV SOLN: 500.0000 mg | INTRAVENOUS | Status: DC

## 2016-07-30 MED ORDER — DEXTROSE 5 % IV SOLN
1.0000 g | INTRAVENOUS | Status: DC
  Administered 2016-07-31 – 2016-08-07 (×8): 1 g via INTRAVENOUS
  Filled 2016-07-30 (×10): qty 1

## 2016-07-30 MED ORDER — FLEET ENEMA 7-19 GM/118ML RE ENEM
1.0000 | ENEMA | Freq: Every day | RECTAL | Status: DC | PRN
  Filled 2016-07-30: qty 1

## 2016-07-30 MED ORDER — ONDANSETRON HCL 4 MG PO TABS: 4.0000 mg | ORAL_TABLET | Freq: Four times a day (QID) | ORAL | Status: DC | PRN

## 2016-07-30 MED ORDER — IPRATROPIUM BROMIDE 0.02 % IN SOLN
0.5000 mg | Freq: Once | RESPIRATORY_TRACT | Status: AC
  Administered 2016-07-30: 0.5 mg via RESPIRATORY_TRACT
  Filled 2016-07-30: qty 2.5

## 2016-07-30 MED ORDER — SENNOSIDES-DOCUSATE SODIUM 8.6-50 MG PO TABS
1.0000 | ORAL_TABLET | Freq: Two times a day (BID) | ORAL | Status: DC
  Administered 2016-07-30 – 2016-08-08 (×16): 1 via ORAL
  Filled 2016-07-30 (×17): qty 1

## 2016-07-30 MED ORDER — LISINOPRIL 5 MG PO TABS
5.0000 mg | ORAL_TABLET | Freq: Every day | ORAL | Status: DC
  Administered 2016-07-31 – 2016-08-07 (×8): 5 mg via ORAL
  Filled 2016-07-30 (×8): qty 1

## 2016-07-30 MED ORDER — FUROSEMIDE 10 MG/ML IJ SOLN
40.0000 mg | Freq: Two times a day (BID) | INTRAMUSCULAR | Status: DC
Start: 2016-07-30 — End: 2016-07-31
  Administered 2016-07-31: 40 mg via INTRAVENOUS
  Filled 2016-07-30: qty 4

## 2016-07-30 MED ORDER — POTASSIUM CHLORIDE CRYS ER 20 MEQ PO TBCR
40.0000 meq | EXTENDED_RELEASE_TABLET | Freq: Every day | ORAL | Status: AC
  Administered 2016-07-31: 40 meq via ORAL
  Filled 2016-07-30 (×2): qty 2

## 2016-07-30 MED ORDER — ALBUTEROL SULFATE (2.5 MG/3ML) 0.083% IN NEBU
2.5000 mg | INHALATION_SOLUTION | RESPIRATORY_TRACT | Status: DC | PRN
  Filled 2016-07-30 (×2): qty 3

## 2016-07-30 MED ORDER — HYDROCODONE-ACETAMINOPHEN 5-325 MG PO TABS
1.0000 | ORAL_TABLET | ORAL | Status: DC | PRN
  Administered 2016-07-31: 1 via ORAL
  Filled 2016-07-30: qty 1

## 2016-07-30 MED ORDER — IPRATROPIUM-ALBUTEROL 0.5-2.5 (3) MG/3ML IN SOLN
3.0000 mL | Freq: Four times a day (QID) | RESPIRATORY_TRACT | Status: DC
  Administered 2016-07-30: 3 mL via RESPIRATORY_TRACT

## 2016-07-30 MED ORDER — POTASSIUM CHLORIDE CRYS ER 20 MEQ PO TBCR
20.0000 meq | EXTENDED_RELEASE_TABLET | Freq: Every day | ORAL | Status: DC
  Administered 2016-07-31: 20 meq via ORAL
  Filled 2016-07-30 (×2): qty 1

## 2016-07-30 MED ORDER — INSULIN ASPART 100 UNIT/ML ~~LOC~~ SOLN
0.0000 [IU] | Freq: Every day | SUBCUTANEOUS | Status: DC
  Administered 2016-07-31: 2 [IU] via SUBCUTANEOUS

## 2016-07-30 MED ORDER — VANCOMYCIN HCL IN DEXTROSE 1-5 GM/200ML-% IV SOLN
1000.0000 mg | Freq: Once | INTRAVENOUS | Status: DC
  Filled 2016-07-30: qty 200

## 2016-07-30 MED ORDER — VANCOMYCIN HCL IN DEXTROSE 1-5 GM/200ML-% IV SOLN
1000.0000 mg | INTRAVENOUS | Status: DC
  Administered 2016-07-31 – 2016-08-01 (×2): 1000 mg via INTRAVENOUS
  Filled 2016-07-30 (×3): qty 200

## 2016-07-30 MED ORDER — ACETAMINOPHEN 650 MG RE SUPP: 650.0000 mg | Freq: Four times a day (QID) | RECTAL | Status: DC | PRN

## 2016-07-30 MED ORDER — ALBUTEROL SULFATE (2.5 MG/3ML) 0.083% IN NEBU
5.0000 mg | INHALATION_SOLUTION | Freq: Once | RESPIRATORY_TRACT | Status: AC
  Administered 2016-07-30: 5 mg via RESPIRATORY_TRACT
  Filled 2016-07-30: qty 6

## 2016-07-30 MED ORDER — DEXTROSE 5 % IV SOLN
2.0000 g | Freq: Once | INTRAVENOUS | Status: AC
  Administered 2016-07-30: 2 g via INTRAVENOUS
  Filled 2016-07-30: qty 2

## 2016-07-30 MED ORDER — ACETAMINOPHEN 325 MG PO TABS
650.0000 mg | ORAL_TABLET | Freq: Four times a day (QID) | ORAL | Status: DC | PRN
  Filled 2016-07-30: qty 2

## 2016-07-30 MED ORDER — DEXTROSE 5 % IV SOLN: 1.0000 g | Freq: Once | INTRAVENOUS | Status: DC

## 2016-07-30 MED ORDER — LEVOTHYROXINE SODIUM 25 MCG PO TABS
125.0000 ug | ORAL_TABLET | Freq: Every day | ORAL | Status: DC
  Administered 2016-07-31 – 2016-08-08 (×9): 125 ug via ORAL
  Filled 2016-07-30 (×10): qty 1

## 2016-07-30 MED ORDER — FERROUS SULFATE 325 (65 FE) MG PO TABS
325.0000 mg | ORAL_TABLET | Freq: Every day | ORAL | Status: DC
  Administered 2016-07-31 – 2016-08-08 (×9): 325 mg via ORAL
  Filled 2016-07-30 (×10): qty 1

## 2016-07-30 MED ORDER — APIXABAN 5 MG PO TABS
5.0000 mg | ORAL_TABLET | Freq: Two times a day (BID) | ORAL | Status: DC
  Administered 2016-07-30 – 2016-08-04 (×11): 5 mg via ORAL
  Filled 2016-07-30 (×12): qty 1

## 2016-07-30 MED ORDER — BISACODYL 5 MG PO TBEC
5.0000 mg | DELAYED_RELEASE_TABLET | Freq: Every day | ORAL | Status: DC
  Administered 2016-07-31 – 2016-08-08 (×7): 5 mg via ORAL
  Filled 2016-07-30 (×7): qty 1

## 2016-07-30 NOTE — ED Notes (Addendum)
Unable to get UA Pt had stool mixed in with UA.

## 2016-07-30 NOTE — ED Notes (Signed)
Pt IV no longer patent. IV team consult placed.

## 2016-07-30 NOTE — ED Notes (Signed)
Daughter's phone number   [name]  [phone]

## 2016-07-30 NOTE — ED Notes (Signed)
RN contacted phlebotomy about delay in blood draw. RN attempted unsuccessfully.

## 2016-07-30 NOTE — ED Notes (Signed)
IV site blown. IV team consulted at this time.

## 2016-07-30 NOTE — ED Notes (Signed)
[name] (daughter) [phone]

## 2016-07-30 NOTE — ED Provider Notes (Addendum)
[facility] DEPT Provider Note   CSN: [id number] Arrival date & time: 07/30/16  1234     History   Chief Complaint No chief complaint on file.   HPI [name] is a 83 y.o. female.  Patient w hx afib, chf, presents via ems with c/o progressive bil leg swelling and increased cough/sob for the past week. Symptoms worse today. Moderate sob. No chest pain. Denies sore throat or runny nose. Compliant w normal meds. pcp is treating for cellulitis of legs, and had recently increased lasix dose - family states no improvement w tx. Denies fever or chills. No pnd. Denies hx copd, or chronic resp disease.    The history is provided by the patient, a relative and the EMS personnel.    Past Medical History:  Diagnosis Date  . Bladder cancer ([place])   . Breast cancer ([place])   . Cervical cancer ([place])   . Chronic diastolic heart failure ([place])    a. Echo 5/12: Mild LVH, EF 55-60%, mild AI, mild MR, severe LAE, mild RAE, PASP 31, small pericardial effusion  . Colon cancer ([place])    "polyp" (12/08/2012)  . Diastolic CHF, chronic ([place]) 11/11/2012   Class 2b-3 2 d echo 5/12 mild LVH EF 55-50%, MILD AI, MILD MR, SEVERE LAE, mild RAE, PASP 31, SMALL PERICRADIAL EFFUSION  . DM2 (diabetes mellitus, type 2) ([place])    TYPE 2  . H/O ovarian cancer 11/11/2012    MUCINOUS CYST S/P RESECTION 35 YEARS AGO  . HLD (hyperlipidemia)   . Hx of bladder cancer 11/11/2012  . Hx of cervical cancer 11/11/2012  . Hypertension   . Hypothyroidism   . OSA (obstructive sleep apnea)   . Ovarian cancer ([place])   . Permanent atrial fibrillation (H[facility])    a. coumadin d/c'd => Pradaxa in 05/2012  . S/P hysterectomy 11/11/2012  . SBO (small bowel obstruction)   . SBO (small bowel obstruction) 08/25/2014  . Venous insufficiency    chronic LE edema    Patient Active Problem List   Diagnosis Date Noted  . Type A influenza 07/26/2015  . Bowel obstruction 08/25/2014  . Atrial fibrillation with RVR ([place]) 08/25/2014  . Acute  renal failure ([place]) 08/25/2014  . Diastolic heart failure ([place]) 08/25/2014  . AKI (acute kidney injury) ([place]) 12/29/2013  . Small bowel obstruction 12/28/2013  . Essential hypertension 03/23/2013  . Hematoma 12/08/2012  . Nausea & vomiting 11/11/2012  . Diabetes mellitus ([place]) 11/11/2012  . Hypothyroidism 11/11/2012  . Hx SBO 11/11/2012  . Colon cancer ([place]) 11/11/2012  . Diastolic CHF, chronic ([place]) 11/11/2012  . Venous insufficiency 11/11/2012  . Hyperlipidemia 11/11/2012  . OSA (obstructive sleep apnea) 11/11/2012  . HX: breast cancer 11/11/2012  . Hx of cervical cancer 11/11/2012  . H/O ovarian cancer 11/11/2012  . Hx of bladder cancer 11/11/2012  . S/P hysterectomy 11/11/2012  . PNA (pneumonia) 11/11/2012  . Hypokalemia 12/09/2011  . SBO (small bowel obstruction) 12/06/2011  . Chronic diastolic heart failure ([place]) 02/17/2011  . Atrial fibrillation ([place]) 02/17/2011    Past Surgical History:  Procedure Laterality Date  . APPENDECTOMY    . BLADDER SURGERY    . BREAST BIOPSY Bilateral   . BREAST LUMPECTOMY Right   . CATARACT EXTRACTION W/ INTRAOCULAR LENS  IMPLANT, BILATERAL    . EXPLORATORY LAPAROTOMY WITH ABDOMINAL MASS EXCISION     "21# ovarian tumor; benign" (12/08/2012)  . I&D EXTREMITY Right 12/31/2012   Procedure: IRRIGATION AND DEBRIDEMENT RIGHT KNEE ULCER WITH  PLACEMENT OF A CELL AND VAC ;  Surgeon: Theodoro Kos, DO;  Location: [facility];  Service: Plastics;  Laterality: Right;  . MASTECTOMY, RADICAL Left   . VAGINAL HYSTERECTOMY      OB History    No data available       Home Medications    Prior to Admission medications   Medication Sig Start Date End Date Taking? Authorizing Provider  apixaban (ELIQUIS) 5 MG TABS tablet Take 1 tablet (5 mg total) by mouth 2 (two) times daily. 01/18/14   Thompson Grayer, MD  bisacodyl (DULCOLAX) 5 MG EC tablet Take 5 mg by mouth daily.     Historical Provider, MD  Cyanocobalamin (VITAMIN B-12 IJ) Inject 1 mL as directed every  30 (thirty) days.     Historical Provider, MD  diltiazem (TIAZAC) 360 MG 24 hr capsule Take 360 mg by mouth daily.    Historical Provider, MD  ferrous sulfate 325 (65 FE) MG tablet Take 325 mg by mouth daily with breakfast.    Historical Provider, MD  furosemide (LASIX) 40 MG tablet Take 40 mg by mouth daily. Take one extra tablet daily as needed for weight gain of >2 lbs in 24 hour period    Historical Provider, MD  levothyroxine (SYNTHROID, LEVOTHROID) 125 MCG tablet Take 125 mcg by mouth daily before breakfast.    Historical Provider, MD  lisinopril (PRINIVIL,ZESTRIL) 5 MG tablet Take 5 mg by mouth daily.    Historical Provider, MD  potassium chloride SA (K-DUR,KLOR-CON) 20 MEQ tablet Take 20 mEq by mouth daily.    Historical Provider, MD  senna-docusate (SENOKOT-S) 8.6-50 MG per tablet Take 1 tablet by mouth 2 (two) times daily. 08/31/14   Geradine Girt, DO  sodium phosphate (FLEET) 7-19 GM/118ML ENEM Place 133 mLs (1 enema total) rectally daily as needed for severe constipation. 08/31/14   Geradine Girt, DO    Family History No family history on file.  Social History Social History  Substance Use Topics  . Smoking status: Former Smoker    Packs/day: 3.00    Years: 25.00    Types: Cigarettes    Quit date: 07/02/1966  . Smokeless tobacco: Never Used  . Alcohol use No     Allergies   Codeine; Morphine and related; Statins; and Zetia [ezetimibe]   Review of Systems Review of Systems  Constitutional: Negative for chills and fever.  HENT: Negative for sore throat.   Eyes: Negative for redness.  Respiratory: Positive for cough, shortness of breath and wheezing.   Cardiovascular: Positive for leg swelling. Negative for chest pain.  Gastrointestinal: Negative for abdominal pain.  Genitourinary: Negative for flank pain.  Musculoskeletal: Negative for back pain and neck pain.  Skin: Negative for rash.  Neurological: Negative for headaches.  Hematological: Does not bruise/bleed  easily.  Psychiatric/Behavioral: Negative for confusion.     Physical Exam Updated Vital Signs BP 114/73 (BP Location: Right Arm)   Pulse 102   Temp 98.9 F (37.2 C) (Oral)   Resp 16   Ht 5\' 1"  (1.549 m)   Wt 99.8 kg   SpO2 100%   BMI 41.57 kg/m   Physical Exam  Constitutional: She appears well-developed and well-nourished. No distress.  HENT:  Mouth/Throat: Oropharynx is clear and moist.  Eyes: Conjunctivae are normal. No scleral icterus.  Neck: Neck supple. No tracheal deviation present.  Cardiovascular: Normal heart sounds and intact distal pulses.   Pulmonary/Chest: No respiratory distress. She has wheezes.  Mild resp distress. Rhonchi  on right.   Abdominal: Soft. Normal appearance and bowel sounds are normal. She exhibits no distension. There is no tenderness.  Genitourinary:  Genitourinary Comments: No cva tenderness  Musculoskeletal: She exhibits edema.  Mod-sev edema to bil knees bilaterally.   Neurological: She is alert.  Skin: Skin is warm and dry. No rash noted. She is not diaphoretic.  Psychiatric: She has a normal mood and affect.  Nursing note and vitals reviewed.    ED Treatments / Results  Labs (all labs ordered are listed, but only abnormal results are displayed) Results for orders placed or performed during the hospital encounter of 07/30/16  Comprehensive metabolic panel  Result Value Ref Range   Sodium 141 135 - 145 mmol/L   Potassium 3.2 (L) 3.5 - 5.1 mmol/L   Chloride 102 101 - 111 mmol/L   CO2 27 22 - 32 mmol/L   Glucose, Bld 196 (H) 65 - 99 mg/dL   BUN 11 6 - 20 mg/dL   Creatinine, Ser 0.78 0.44 - 1.00 mg/dL   Calcium 8.5 (L) 8.9 - 10.3 mg/dL   Total Protein 6.2 (L) 6.5 - 8.1 g/dL   Albumin 3.7 3.5 - 5.0 g/dL   AST 22 15 - 41 U/L   ALT 11 (L) 14 - 54 U/L   Alkaline Phosphatase 47 38 - 126 U/L   Total Bilirubin 0.5 0.3 - 1.2 mg/dL   GFR calc non Af Amer >60 >60 mL/min   GFR calc Af Amer >60 >60 mL/min   Anion gap 12 5 - 15  CBC WITH  DIFFERENTIAL  Result Value Ref Range   WBC 5.5 4.0 - 10.5 K/uL   RBC 4.60 3.87 - 5.11 MIL/uL   Hemoglobin 13.8 12.0 - 15.0 g/dL   HCT 43.4 36.0 - 46.0 %   MCV 94.3 78.0 - 100.0 fL   MCH 30.0 26.0 - 34.0 pg   MCHC 31.8 30.0 - 36.0 g/dL   RDW 15.8 (H) 11.5 - 15.5 %   Platelets PENDING 150 - 400 K/uL   Neutrophils Relative % 91 %   Neutro Abs 5.0 1.7 - 7.7 K/uL   Lymphocytes Relative 7 %   Lymphs Abs 0.4 (L) 0.7 - 4.0 K/uL   Monocytes Relative 1 %   Monocytes Absolute 0.1 0.1 - 1.0 K/uL   Eosinophils Relative 0 %   Eosinophils Absolute 0.0 0.0 - 0.7 K/uL   Basophils Relative 0 %   Basophils Absolute 0.0 0.0 - 0.1 K/uL  I-Stat CG4 Lactic Acid, ED  (not at  [facility])  Result Value Ref Range   Lactic Acid, Venous 1.73 0.5 - 1.9 mmol/L   Dg Chest Port 1 View  Result Date: 07/30/2016 CLINICAL DATA:  Cough and shortness of breath EXAM: PORTABLE CHEST 1 VIEW COMPARISON:  Chest x-ray of July 19, 2015 and chest CT scan of the same date. FINDINGS: The lungs are well-expanded. There is increased density obscuring the right heart border more conspicuous than on the previous study. The interstitial markings elsewhere are coarse. There is no significant pleural effusion. The cardiac silhouette remains enlarged. The pulmonary vascularity is not engorged. IMPRESSION: Abnormal density in the right lower hemithorax more conspicuous than in the past. The right heart border is obscured. This may reflect postobstructive atelectasis or pneumonia. Repeat chest CT scanning is recommended in an effort to exclude malignancy. Electronically Signed   By: David  Martinique M.D.   On: 07/30/2016 13:10    EKG  EKG Interpretation None  Radiology Dg Chest Port 1 View  Result Date: 07/30/2016 CLINICAL DATA:  Cough and shortness of breath EXAM: PORTABLE CHEST 1 VIEW COMPARISON:  Chest x-ray of July 19, 2015 and chest CT scan of the same date. FINDINGS: The lungs are well-expanded. There is increased density  obscuring the right heart border more conspicuous than on the previous study. The interstitial markings elsewhere are coarse. There is no significant pleural effusion. The cardiac silhouette remains enlarged. The pulmonary vascularity is not engorged. IMPRESSION: Abnormal density in the right lower hemithorax more conspicuous than in the past. The right heart border is obscured. This may reflect postobstructive atelectasis or pneumonia. Repeat chest CT scanning is recommended in an effort to exclude malignancy. Electronically Signed   By: David  Martinique M.D.   On: 07/30/2016 13:10    Procedures Procedures (including critical care time)  Medications Ordered in ED Medications - No data to display   Initial Impression / Assessment and Plan / ED Course  I have reviewed the triage vital signs and the nursing notes.  Pertinent labs & imaging results that were available during my care of the patient were reviewed by me and considered in my medical decision making (see chart for details).  Iv ns. Labs. Cxr.  Albuterol and atrovent neb.   Cultures.   Cxr/lung exam felt concerning for pna.   Persistent wheezing. Sob.  Will plan for admission.  Will add flu panel to labs.   Radiology rec ct chest r/o mass vs pna - will get ct if creatinine ok.  Medical Service consulted for admission.  Discussed with Hospitalists team, Dyanne Carrel - they will admit.   Final Clinical Impressions(s) / ED Diagnoses   Final diagnoses:  None    New Prescriptions New Prescriptions   No medications on file        Lajean Saver, MD 07/30/16 [phone]

## 2016-07-30 NOTE — ED Notes (Signed)
Ordered heart healthy tray  

## 2016-07-30 NOTE — ED Notes (Signed)
[name] and I help PT on the bedpan. Pt tolerated it well.

## 2016-07-30 NOTE — Progress Notes (Signed)
Pharmacy Antibiotic Note  [name] is a 83 y.o. female admitted on 07/30/2016 with possible pneumonia.  Pharmacy has been consulted for vancomycin and cefepime dosing. Renal function wnl.  Vancomycin trough goal 15-20  Plan: 1) Vancomycin 1250mg  IV x 1 then 1g IV q24 2) Cefepime 2g IV x1 then 1g IV q24 3) Follow renal function, cultures, LOT, level if needed  Height: 5\' 1"  (154.9 cm) Weight: 220 lb (99.8 kg) IBW/kg (Calculated) : 47.8  Temp (24hrs), Avg:98.9 F (37.2 C), Min:98.9 F (37.2 C), Max:98.9 F (37.2 C)   Recent Labs Lab 07/30/16 1459 07/30/16 1524  WBC 5.5  --   CREATININE 0.78  --   LATICACIDVEN  --  1.73    Estimated Creatinine Clearance: 46.6 mL/min (by C-G formula based on SCr of 0.78 mg/dL).    Allergies  Allergen Reactions  . Codeine     unknown  . Morphine And Related     sick  . Statins     sick  . Zetia [Ezetimibe] Other (See Comments)    Side effect too strong     Antimicrobials this admission: 1/29 Vancomycin >> 1/29 Cefepime >>  Dose adjustments this admission: n/a  Microbiology results: 1/29 blood x2>>  Thank you for allowing pharmacy to be a part of this patient's care.  Deboraha Sprang 07/30/2016 4:16 PM

## 2016-07-30 NOTE — H&P (Signed)
History and Physical    [name] [id number] DOB: 16-Sep-1919 DOA: 07/30/2016  PCP: [name], MD Patient coming from: facility  Chief Complaint: Worsening shortness of breath and worsening lower extremity edema  HPI: [name] is a very pleasant 83 y.o. female with medical history significant   diastolic heart failure, A. fib, obstructive sleep apnea on C Pap, 16 bowel obstructions in the last 5 years, ovarian cancer, bladder cancer, hypertension, hypothyroidism venous insufficiency presents to the emergency department from the facility with chief complaint sudden worsening shortness of breath and gradual worsening lower extremity edema. So evaluation yields acute respiratory failure hypoxia likely related to healthcare associated pneumonia in the setting of mild CHF exacerbation.  Information is obtained from the patient and the daughter who is at the bedside. Daughter reports that 7-10 days ago they saw her primary care provider with worsening bilateral lower extremity edema. She reports her Lasix was increased and an Haematologist was applied. Reports patient did well until last night she noted intermittent moist sounding nonproductive cough. Associated symptoms include decreased oral intake 2 episodes of loose stool crease. Production. Patient denies headache dizziness syncope or near-syncope. He denies chest pain palpitations nausea vomiting hematuria frequency or urgency. Of note daughter reports that last week it PCP office there was some discussion about cellulitis on that left leg and she states antibiotic was initiated. However, no antibiotic on home meds. Daughter also states that patient has not diuresed very well in spite of the Lasix increase.    ED Course: In the emergency department is afebrile hemodynamically stable slightly tachycardic. She is hypoxic with an oxygen saturation level 87% on room air. Of note she was provided with DuoNeb 725 mg Solu-Medrol in the  field.  Review of Systems: As per HPI otherwise 10 point review of systems negative.   Ambulatory Status: patient mostly bedbound  Past Medical History:  Diagnosis Date  . Bladder cancer ([place])   . Breast cancer ([place])   . Cervical cancer ([place])   . Chronic diastolic heart failure ([place])    a. Echo 5/12: Mild LVH, EF 55-60%, mild AI, mild MR, severe LAE, mild RAE, PASP 31, small pericardial effusion  . Colon cancer ([place])    "polyp" (12/08/2012)  . Diastolic CHF, chronic ([place]) 11/11/2012   Class 2b-3 2 d echo 5/12 mild LVH EF 55-50%, MILD AI, MILD MR, SEVERE LAE, mild RAE, PASP 31, SMALL PERICRADIAL EFFUSION  . DM2 (diabetes mellitus, type 2) ([place])    TYPE 2  . H/O ovarian cancer 11/11/2012    MUCINOUS CYST S/P RESECTION 35 YEARS AGO  . HLD (hyperlipidemia)   . Hx of bladder cancer 11/11/2012  . Hx of cervical cancer 11/11/2012  . Hypertension   . Hypothyroidism   . OSA (obstructive sleep apnea)   . Ovarian cancer ([place])   . Permanent atrial fibrillation (HCC)    a. coumadin d/c'd => Pradaxa in 05/2012  . S/P hysterectomy 11/11/2012  . SBO (small bowel obstruction)   . SBO (small bowel obstruction) 08/25/2014  . Venous insufficiency    chronic LE edema    Past Surgical History:  Procedure Laterality Date  . APPENDECTOMY    . BLADDER SURGERY    . BREAST BIOPSY Bilateral   . BREAST LUMPECTOMY Right   . CATARACT EXTRACTION W/ INTRAOCULAR LENS  IMPLANT, BILATERAL    . EXPLORATORY LAPAROTOMY WITH ABDOMINAL MASS EXCISION     "21# ovarian tumor; benign" (12/08/2012)  . I&D EXTREMITY Right  12/31/2012   Procedure: IRRIGATION AND DEBRIDEMENT RIGHT KNEE ULCER WITH PLACEMENT OF A CELL AND VAC ;  Surgeon: Theodoro Kos, DO;  Location: [facility];  Service: Plastics;  Laterality: Right;  . MASTECTOMY, RADICAL Left   . VAGINAL HYSTERECTOMY      Social History   Social History  . Marital status: Widowed    Spouse name: N/A  . Number of children: N/A  . Years of education: N/A   Occupational  History  . Not on file.   Social History Main Topics  . Smoking status: Former Smoker    Packs/day: 3.00    Years: 25.00    Types: Cigarettes    Quit date: 07/02/1966  . Smokeless tobacco: Never Used  . Alcohol use No  . Drug use: No  . Sexual activity: No   Other Topics Concern  . Not on file   Social History Narrative  . No narrative on file   Currently assisted living resident Allergies  Allergen Reactions  . Codeine     unknown  . Morphine And Related     sick  . Statins     sick  . Zetia [Ezetimibe] Other (See Comments)    Side effect too strong     Family History  Problem Relation Age of Onset  . Family history unknown: Yes    Prior to Admission medications   Medication Sig Start Date End Date Taking? Authorizing Provider  apixaban (ELIQUIS) 5 MG TABS tablet Take 1 tablet (5 mg total) by mouth 2 (two) times daily. 01/18/14   Thompson Grayer, MD  bisacodyl (DULCOLAX) 5 MG EC tablet Take 5 mg by mouth daily.     Historical Provider, MD  Cyanocobalamin (VITAMIN B-12 IJ) Inject 1 mL as directed every 30 (thirty) days.     Historical Provider, MD  diltiazem (TIAZAC) 360 MG 24 hr capsule Take 360 mg by mouth daily.    Historical Provider, MD  ferrous sulfate 325 (65 FE) MG tablet Take 325 mg by mouth daily with breakfast.    Historical Provider, MD  furosemide (LASIX) 40 MG tablet Take 40 mg by mouth daily. Take one extra tablet daily as needed for weight gain of >2 lbs in 24 hour period    Historical Provider, MD  levothyroxine (SYNTHROID, LEVOTHROID) 125 MCG tablet Take 125 mcg by mouth daily before breakfast.    Historical Provider, MD  lisinopril (PRINIVIL,ZESTRIL) 5 MG tablet Take 5 mg by mouth daily.    Historical Provider, MD  potassium chloride SA (K-DUR,KLOR-CON) 20 MEQ tablet Take 20 mEq by mouth daily.    Historical Provider, MD  senna-docusate (SENOKOT-S) 8.6-50 MG per tablet Take 1 tablet by mouth 2 (two) times daily. 08/31/14   Geradine Girt, DO  sodium  phosphate (FLEET) 7-19 GM/118ML ENEM Place 133 mLs (1 enema total) rectally daily as needed for severe constipation. 08/31/14   Geradine Girt, DO    Physical Exam: Vitals:   07/30/16 1237 07/30/16 1245 07/30/16 1345 07/30/16 1415  BP: 114/73  121/77 128/90  Pulse: 102  (!) 53 91  Resp: 16  14 26   Temp: 98.9 F (37.2 C)     TempSrc: Oral     SpO2: 100%  (!) 87% 96%  Weight:  99.8 kg (220 lb)    Height:  5\' 1"  (1.549 m)       General:  Appears calm and comfortable no acute distress Eyes:  PERRL, EOMI, normal lids, iris ENT:  grossly normal  hearing, lips & tongue, mucus membranes of her mouth are pink slightly dry Neck:  no LAD, masses or thyromegaly Cardiovascular:  Irregularly irregular +murmur. 2+LE edema. A lateral and a boot intact  Respiratory: Mild increased work of breathing with conversation. Breath sounds are quite coarse throughout mild end expiratory wheezing no crackles Abdomen:  soft, ntnd, base soft positive bowel sounds no guarding or rebounding Skin:  no rash or induration seen on limited exam Musculoskeletal:  grossly normal tone BUE/BLE, good ROM, no bony abnormality Psychiatric:  grossly normal mood and affect, speech fluent and appropriate, AOx3 Neurologic:  CN 2-12 grossly intact, moves all extremities in coordinated fashion, sensation intact  Labs on Admission: I have personally reviewed following labs and imaging studies  CBC:  Recent Labs Lab 07/30/16 1459  WBC 5.5  NEUTROABS 5.0  HGB 13.8  HCT 43.4  MCV 94.3  PLT 123456*   Basic Metabolic Panel:  Recent Labs Lab 07/30/16 1459  NA 141  K 3.2*  CL 102  CO2 27  GLUCOSE 196*  BUN 11  CREATININE 0.78  CALCIUM 8.5*   GFR: Estimated Creatinine Clearance: 46.6 mL/min (by C-G formula based on SCr of 0.78 mg/dL). Liver Function Tests:  Recent Labs Lab 07/30/16 1459  AST 22  ALT 11*  ALKPHOS 47  BILITOT 0.5  PROT 6.2*  ALBUMIN 3.7   No results for input(s): LIPASE, AMYLASE in the last  168 hours. No results for input(s): AMMONIA in the last 168 hours. Coagulation Profile: No results for input(s): INR, PROTIME in the last 168 hours. Cardiac Enzymes: No results for input(s): CKTOTAL, CKMB, CKMBINDEX, TROPONINI in the last 168 hours. BNP (last 3 results) No results for input(s): PROBNP in the last 8760 hours. HbA1C: No results for input(s): HGBA1C in the last 72 hours. CBG: No results for input(s): GLUCAP in the last 168 hours. Lipid Profile: No results for input(s): CHOL, HDL, LDLCALC, TRIG, CHOLHDL, LDLDIRECT in the last 72 hours. Thyroid Function Tests: No results for input(s): TSH, T4TOTAL, FREET4, T3FREE, THYROIDAB in the last 72 hours. Anemia Panel: No results for input(s): VITAMINB12, FOLATE, FERRITIN, TIBC, IRON, RETICCTPCT in the last 72 hours. Urine analysis:    Component Value Date/Time   COLORURINE AMBER (A) 07/19/2015 1359   APPEARANCEUR TURBID (A) 07/19/2015 1359   LABSPEC >1.046 (H) 07/19/2015 1359   PHURINE 6.5 07/19/2015 1359   GLUCOSEU NEGATIVE 07/19/2015 1359   HGBUR MODERATE (A) 07/19/2015 1359   BILIRUBINUR NEGATIVE 07/19/2015 1359   KETONESUR NEGATIVE 07/19/2015 1359   PROTEINUR 100 (A) 07/19/2015 1359   UROBILINOGEN 0.2 08/25/2014 1246   NITRITE NEGATIVE 07/19/2015 1359   LEUKOCYTESUR LARGE (A) 07/19/2015 1359    Creatinine Clearance: Estimated Creatinine Clearance: 46.6 mL/min (by C-G formula based on SCr of 0.78 mg/dL).  Sepsis Labs: @LABRCNTIP (procalcitonin:4,lacticidven:4) )No results found for this or any previous visit (from the past 240 hour(s)).   Radiological Exams on Admission: Dg Chest Port 1 View  Result Date: 07/30/2016 CLINICAL DATA:  Cough and shortness of breath EXAM: PORTABLE CHEST 1 VIEW COMPARISON:  Chest x-ray of July 19, 2015 and chest CT scan of the same date. FINDINGS: The lungs are well-expanded. There is increased density obscuring the right heart border more conspicuous than on the previous study. The  interstitial markings elsewhere are coarse. There is no significant pleural effusion. The cardiac silhouette remains enlarged. The pulmonary vascularity is not engorged. IMPRESSION: Abnormal density in the right lower hemithorax more conspicuous than in the past. The right heart  border is obscured. This may reflect postobstructive atelectasis or pneumonia. Repeat chest CT scanning is recommended in an effort to exclude malignancy. Electronically Signed   By: David  Martinique M.D.   On: 07/30/2016 13:10    EKG: Independently reviewed. Atrial fibrillation Nonspecific T wave abnormality  Assessment/Plan Principal Problem:   Acute respiratory failure with hypoxia (HCC) Active Problems:   Chronic diastolic heart failure (HCC)   Atrial fibrillation (HCC)   Hypokalemia   Diabetes mellitus (HCC)   Hypothyroidism   Venous insufficiency   Hyperlipidemia   PNA (pneumonia)   Essential hypertension   Bowel obstruction   HCAP (healthcare-associated pneumonia)   Bilateral leg edema   #1. Acute respiratory failure with hypoxia likely related to care associated pneumonia and possible mild acute on chronic diastolic heart failure. Oxygen saturation level 86% on room air in the field. X-ray with right lower lobe density may reflect malignancy/atelectasis or pna -To telemetry -Oxygen supplementation -Monitor oxygen saturation level -Scheduled nebs -anti- biotics per protocol -Blood culture -sputum culture if able -anti-tussive  #2. Healthcare associated pneumonia. Patient with history recently of same. Admission is afebrile hemodynamically stable. No leukocytosis. Lactic acid within the limits of normal -see #1 -monitor -obtain influenza panel  3. Chronic diastolic heart failure. BNP 184. Echo done last year with an EF of 55%, mild LVH no regional wall motion abnormalities. Home medications include diltiazem, Lasix, lisinopril. Lasix recently increased. -Daily weights -Monitor urine output -IV Lasix  40 mg twice a day -Monitor renal function  #4. Bilateral lower extremity edema. Worsening over the last 7 days. PCP recently applied an Haematologist -wound consult for unna boot removal and evaluation of legs -defer continuation of unna boot to wound RN  #5. A. Fib. [place] score 4. Fair rate control. Home meds include eliquis, diltazem. EKG as noted above -Continue home meds  #6. Diabetes. Serum glucose 196 on admission. -Obtain hemoglobin A1c -sliding scale for optimal control  #7. Hypertension. Fair control in the emergency department. Home medications as noted above. -Continue home meds -Monitor closely  #8. History of bowel obstruction. Chart review indicates patient has had 16 bowel obstructions over the last 5 years. Are stable at baseline. Daughter indicates patient requires at least daily BM's to avoid obstruction -continue home regimen    DVT prophylaxis: eliquis  Code Status: limited  Family Communication: daughter at bedside  Disposition Plan: back to facility  Consults called: none  Admission status: obs    Radene Gunning MD [facility]  If 7PM-7AM, please contact night-coverage [email or web address] Password [facility]  07/30/2016, 6:03 PM

## 2016-07-30 NOTE — ED Triage Notes (Addendum)
Patient comes in per [facility] with c/o of SOB. From [facility] in assisted living. Patient has bil edema to LE. Patient was placed on antixs 3 days ago. Patient then started having diarrhea and sob yesterday with productive cough, clear sputum. Patient a/ox4. Patient was 82-84% RA. Wheezing/rales noted. Patient has had 2 duonebs. 125 solumedrol. 22 in right hand. SpO2 now 99% on nebs. Hx chf, abd distention at baseline. 139/80, 103 HR, RR 36 and shallow. fsbs 136. No c/o pain. Possible AMS per staff. afib with a few PVCs on the monitor per EMS.

## 2016-07-31 ENCOUNTER — Inpatient Hospital Stay (HOSPITAL_COMMUNITY): Payer: Medicare Other

## 2016-07-31 ENCOUNTER — Encounter (HOSPITAL_COMMUNITY): Payer: Self-pay

## 2016-07-31 DIAGNOSIS — R600 Localized edema: Secondary | ICD-10-CM | POA: Diagnosis not present

## 2016-07-31 DIAGNOSIS — Z87891 Personal history of nicotine dependence: Secondary | ICD-10-CM | POA: Diagnosis not present

## 2016-07-31 DIAGNOSIS — Z85038 Personal history of other malignant neoplasm of large intestine: Secondary | ICD-10-CM | POA: Diagnosis not present

## 2016-07-31 DIAGNOSIS — I350 Nonrheumatic aortic (valve) stenosis: Secondary | ICD-10-CM | POA: Diagnosis present

## 2016-07-31 DIAGNOSIS — N189 Chronic kidney disease, unspecified: Secondary | ICD-10-CM | POA: Diagnosis not present

## 2016-07-31 DIAGNOSIS — Z7901 Long term (current) use of anticoagulants: Secondary | ICD-10-CM | POA: Diagnosis not present

## 2016-07-31 DIAGNOSIS — I313 Pericardial effusion (noninflammatory): Secondary | ICD-10-CM | POA: Diagnosis present

## 2016-07-31 DIAGNOSIS — R062 Wheezing: Secondary | ICD-10-CM | POA: Diagnosis not present

## 2016-07-31 DIAGNOSIS — Z79899 Other long term (current) drug therapy: Secondary | ICD-10-CM | POA: Diagnosis not present

## 2016-07-31 DIAGNOSIS — E118 Type 2 diabetes mellitus with unspecified complications: Secondary | ICD-10-CM

## 2016-07-31 DIAGNOSIS — I5032 Chronic diastolic (congestive) heart failure: Secondary | ICD-10-CM | POA: Diagnosis not present

## 2016-07-31 DIAGNOSIS — G4733 Obstructive sleep apnea (adult) (pediatric): Secondary | ICD-10-CM | POA: Diagnosis present

## 2016-07-31 DIAGNOSIS — R0602 Shortness of breath: Secondary | ICD-10-CM | POA: Diagnosis not present

## 2016-07-31 DIAGNOSIS — L89322 Pressure ulcer of left buttock, stage 2: Secondary | ICD-10-CM | POA: Diagnosis present

## 2016-07-31 DIAGNOSIS — I5043 Acute on chronic combined systolic (congestive) and diastolic (congestive) heart failure: Secondary | ICD-10-CM | POA: Diagnosis not present

## 2016-07-31 DIAGNOSIS — I5031 Acute diastolic (congestive) heart failure: Secondary | ICD-10-CM | POA: Diagnosis not present

## 2016-07-31 DIAGNOSIS — Z8541 Personal history of malignant neoplasm of cervix uteri: Secondary | ICD-10-CM | POA: Diagnosis not present

## 2016-07-31 DIAGNOSIS — L89152 Pressure ulcer of sacral region, stage 2: Secondary | ICD-10-CM | POA: Diagnosis not present

## 2016-07-31 DIAGNOSIS — R531 Weakness: Secondary | ICD-10-CM | POA: Diagnosis not present

## 2016-07-31 DIAGNOSIS — Z452 Encounter for adjustment and management of vascular access device: Secondary | ICD-10-CM | POA: Diagnosis not present

## 2016-07-31 DIAGNOSIS — R41841 Cognitive communication deficit: Secondary | ICD-10-CM | POA: Diagnosis not present

## 2016-07-31 DIAGNOSIS — I5033 Acute on chronic diastolic (congestive) heart failure: Secondary | ICD-10-CM | POA: Diagnosis not present

## 2016-07-31 DIAGNOSIS — I480 Paroxysmal atrial fibrillation: Secondary | ICD-10-CM | POA: Diagnosis not present

## 2016-07-31 DIAGNOSIS — Y95 Nosocomial condition: Secondary | ICD-10-CM | POA: Diagnosis present

## 2016-07-31 DIAGNOSIS — I482 Chronic atrial fibrillation: Secondary | ICD-10-CM | POA: Diagnosis not present

## 2016-07-31 DIAGNOSIS — Z8551 Personal history of malignant neoplasm of bladder: Secondary | ICD-10-CM | POA: Diagnosis not present

## 2016-07-31 DIAGNOSIS — I272 Pulmonary hypertension, unspecified: Secondary | ICD-10-CM | POA: Diagnosis present

## 2016-07-31 DIAGNOSIS — I110 Hypertensive heart disease with heart failure: Secondary | ICD-10-CM | POA: Diagnosis present

## 2016-07-31 DIAGNOSIS — Z7189 Other specified counseling: Secondary | ICD-10-CM | POA: Diagnosis not present

## 2016-07-31 DIAGNOSIS — R001 Bradycardia, unspecified: Secondary | ICD-10-CM | POA: Diagnosis not present

## 2016-07-31 DIAGNOSIS — R2689 Other abnormalities of gait and mobility: Secondary | ICD-10-CM | POA: Diagnosis not present

## 2016-07-31 DIAGNOSIS — R278 Other lack of coordination: Secondary | ICD-10-CM | POA: Diagnosis not present

## 2016-07-31 DIAGNOSIS — I872 Venous insufficiency (chronic) (peripheral): Secondary | ICD-10-CM | POA: Diagnosis not present

## 2016-07-31 DIAGNOSIS — E039 Hypothyroidism, unspecified: Secondary | ICD-10-CM | POA: Diagnosis present

## 2016-07-31 DIAGNOSIS — Z853 Personal history of malignant neoplasm of breast: Secondary | ICD-10-CM | POA: Diagnosis not present

## 2016-07-31 DIAGNOSIS — Z8543 Personal history of malignant neoplasm of ovary: Secondary | ICD-10-CM | POA: Diagnosis not present

## 2016-07-31 DIAGNOSIS — I4891 Unspecified atrial fibrillation: Secondary | ICD-10-CM | POA: Diagnosis not present

## 2016-07-31 DIAGNOSIS — E785 Hyperlipidemia, unspecified: Secondary | ICD-10-CM | POA: Diagnosis present

## 2016-07-31 DIAGNOSIS — E876 Hypokalemia: Secondary | ICD-10-CM | POA: Diagnosis not present

## 2016-07-31 DIAGNOSIS — I10 Essential (primary) hypertension: Secondary | ICD-10-CM | POA: Diagnosis not present

## 2016-07-31 DIAGNOSIS — E119 Type 2 diabetes mellitus without complications: Secondary | ICD-10-CM | POA: Diagnosis present

## 2016-07-31 DIAGNOSIS — J90 Pleural effusion, not elsewhere classified: Secondary | ICD-10-CM | POA: Diagnosis not present

## 2016-07-31 DIAGNOSIS — J9601 Acute respiratory failure with hypoxia: Secondary | ICD-10-CM | POA: Diagnosis not present

## 2016-07-31 DIAGNOSIS — J189 Pneumonia, unspecified organism: Secondary | ICD-10-CM | POA: Diagnosis not present

## 2016-07-31 LAB — CBC
HEMATOCRIT: 41.1 % (ref 36.0–46.0)
Hemoglobin: 13 g/dL (ref 12.0–15.0)
MCH: 30.1 pg (ref 26.0–34.0)
MCHC: 31.6 g/dL (ref 30.0–36.0)
MCV: 95.1 fL (ref 78.0–100.0)
PLATELETS: 125 10*3/uL — AB (ref 150–400)
RBC: 4.32 MIL/uL (ref 3.87–5.11)
RDW: 15.3 % (ref 11.5–15.5)
WBC: 3.3 10*3/uL — ABNORMAL LOW (ref 4.0–10.5)

## 2016-07-31 LAB — GLUCOSE, CAPILLARY
GLUCOSE-CAPILLARY: 119 mg/dL — AB (ref 65–99)
GLUCOSE-CAPILLARY: 112 mg/dL — AB (ref 65–99)
Glucose-Capillary: 143 mg/dL — ABNORMAL HIGH (ref 65–99)
Glucose-Capillary: 203 mg/dL — ABNORMAL HIGH (ref 65–99)

## 2016-07-31 LAB — INFLUENZA PANEL BY PCR (TYPE A & B)
INFLBPCR: NEGATIVE
Influenza A By PCR: NEGATIVE

## 2016-07-31 LAB — BASIC METABOLIC PANEL
Anion gap: 8 (ref 5–15)
BUN: 11 mg/dL (ref 6–20)
CHLORIDE: 105 mmol/L (ref 101–111)
CO2: 30 mmol/L (ref 22–32)
CREATININE: 0.61 mg/dL (ref 0.44–1.00)
Calcium: 8.7 mg/dL — ABNORMAL LOW (ref 8.9–10.3)
Glucose, Bld: 128 mg/dL — ABNORMAL HIGH (ref 65–99)
POTASSIUM: 3.6 mmol/L (ref 3.5–5.1)
SODIUM: 143 mmol/L (ref 135–145)

## 2016-07-31 LAB — MRSA PCR SCREENING: MRSA by PCR: NEGATIVE

## 2016-07-31 MED ORDER — FUROSEMIDE 10 MG/ML IJ SOLN: 40.0000 mg | Freq: Every day | INTRAMUSCULAR | Status: DC

## 2016-07-31 MED ORDER — DILTIAZEM HCL ER COATED BEADS 180 MG PO CP24
360.0000 mg | ORAL_CAPSULE | Freq: Every day | ORAL | Status: DC
  Administered 2016-07-31 – 2016-08-07 (×8): 360 mg via ORAL
  Filled 2016-07-31 (×8): qty 2

## 2016-07-31 NOTE — Progress Notes (Signed)
PROGRESS NOTE    [name]  [id number] DOB: 04-12-1920 DOA: 07/30/2016 PCP: [name], MD   Brief Narrative: 83 y.o. female with medical history significant   diastolic heart failure, A. fib, obstructive sleep apnea on C Pap, 16 bowel obstructions in the last 5 years, ovarian cancer, bladder cancer, hypertension, hypothyroidism venous insufficiency presents to the emergency department from the facility with chief complaint sudden worsening shortness of breath and gradual worsening lower extremity edema. So evaluation yields acute respiratory failure hypoxia likely related to healthcare associated pneumonia in the setting of mild CHF exacerbation.  Assessment & Plan:  # Acute hypoxic respiratory failure likely in the setting of healthcare associated pneumonia versus mild acute on chronic diastolic congestive heart failure: -Chest x-ray consistent with lower lobe density which may reflect atelectasis versus pneumonia versus mass. -I will order CT scan of chest to further evaluate. -Continue vancomycin and cefepime as per protocol -Follow-up culture results.  -Patient is currently on 2.5 L of oxygen with acceptable oxygen saturation. -Continue bronchodilators.  #Healthcare associated pneumonia: Continue antibiotics. Follow culture. CT scan of chest pending. Influenza negative.  #probable acute on chronic congestive heart failure with preserved EF: Patient had an echocardiogram about 2 weeks ago which was consistent with EF of 55-60%. -Currently on Lasix Iv, change to daily -Monitor urine output, place Foley catheter. -Low salt diet -Mild elevation in troponin likely due to congestive heart failure. -Continue potassium chloride, lisinopril.  #Atrial fibrillation, paroxysmal: Continue diltiazem, allegories. Currently on telemetry. Continue to monitor closely.  #Lateral lower extremity edema: Probably exacerbated by CHF. On Lasix.  -Unna boot, wound care  #Diabetes:  Continue sliding scale. Monitor blood sugar level.  #hypertension:  #History of bowel obstruction: Continue with the bowel regimen. Watch for bowel movement closely.  PT, OT evaluation.  Principal Problem:   Acute respiratory failure with hypoxia (HCC) Active Problems:   Chronic diastolic heart failure (HCC)   Atrial fibrillation (HCC)   Hypokalemia   Diabetes mellitus (HCC)   Hypothyroidism   Venous insufficiency   Hyperlipidemia   PNA (pneumonia)   Essential hypertension   Bowel obstruction   HCAP (healthcare-associated pneumonia)   Bilateral leg edema   Diabetes mellitus with complication (HCC)  DVT prophylaxis:Systemic anticoagulation  Code Status:Partial DO NOT RESUSCITATE.  Family Communication:No family present at bedside  Disposition Plan:Likely discharge home versus rehabilitation in 1-2 days.  Consultants:   None  Procedures:None  Antimicrobials:Vancomycin and cefepime  Subjective:Patient was seen and examined at bedside. Denied chest pain, shortness of breath, nausea or vomiting. Reported feeling better.   Objective: Vitals:   07/30/16 2156 07/30/16 2344 07/31/16 0405 07/31/16 0911  BP: 125/72 (!) 141/70 138/78 (!) 147/85  Pulse: 91 80 91 (!) 59  Resp: 20 18 16 17   Temp:  97.6 F (36.4 C) 98.2 F (36.8 C) 97.5 F (36.4 C)  TempSrc:    Oral  SpO2: 96% 93% 93% 92%  Weight:  99.8 kg (220 lb)    Height:  5\' 1"  (1.549 m)      Intake/Output Summary (Last 24 hours) at 07/31/16 1658 Last data filed at 07/31/16 1419  Gross per 24 hour  Intake              600 ml  Output              425 ml  Net              175 ml   Filed Weights   07/30/16 1245  07/30/16 2344  Weight: 99.8 kg (220 lb) 99.8 kg (220 lb)    Examination:  General exam: Appears calm and comfortable  Respiratory system:Bibasal decreased breath sound, no wheezing, Respiratory effort normal. Cardiovascular system: S1 & S2 heard, RRR.  Gastrointestinal system: Abdomen is nondistended,  soft and nontender. Normal bowel sounds heard. Central nervous system: Alert and oriented. No focal neurological deficits. Extremities:Lateral lower extremity with dressing on, edema+ Skin: No rashes, lesions or ulcers Psychiatry: Judgement and insight appear normal.      Data Reviewed: I have personally reviewed following labs and imaging studies  CBC:  Recent Labs Lab 07/30/16 1459 07/31/16 0623  WBC 5.5 3.3*  NEUTROABS 5.0  --   HGB 13.8 13.0  HCT 43.4 41.1  MCV 94.3 95.1  PLT 118* 0000000*   Basic Metabolic Panel:  Recent Labs Lab 07/30/16 1459 07/31/16 0623  NA 141 143  K 3.2* 3.6  CL 102 105  CO2 27 30  GLUCOSE 196* 128*  BUN 11 11  CREATININE 0.78 0.61  CALCIUM 8.5* 8.7*   GFR: Estimated Creatinine Clearance: 46.6 mL/min (by C-G formula based on SCr of 0.61 mg/dL). Liver Function Tests:  Recent Labs Lab 07/30/16 1459  AST 22  ALT 11*  ALKPHOS 47  BILITOT 0.5  PROT 6.2*  ALBUMIN 3.7   No results for input(s): LIPASE, AMYLASE in the last 168 hours. No results for input(s): AMMONIA in the last 168 hours. Coagulation Profile: No results for input(s): INR, PROTIME in the last 168 hours. Cardiac Enzymes: No results for input(s): CKTOTAL, CKMB, CKMBINDEX, TROPONINI in the last 168 hours. BNP (last 3 results) No results for input(s): PROBNP in the last 8760 hours. HbA1C: No results for input(s): HGBA1C in the last 72 hours. CBG:  Recent Labs Lab 07/30/16 2338 07/31/16 0756 07/31/16 1141 07/31/16 1647  GLUCAP 199* 119* 143* 112*   Lipid Profile: No results for input(s): CHOL, HDL, LDLCALC, TRIG, CHOLHDL, LDLDIRECT in the last 72 hours. Thyroid Function Tests: No results for input(s): TSH, T4TOTAL, FREET4, T3FREE, THYROIDAB in the last 72 hours. Anemia Panel: No results for input(s): VITAMINB12, FOLATE, FERRITIN, TIBC, IRON, RETICCTPCT in the last 72 hours. Sepsis Labs:  Recent Labs Lab 07/30/16 1524  LATICACIDVEN 1.73    Recent Results  (from the past 240 hour(s))  Blood Culture (routine x 2)     Status: None (Preliminary result)   Collection Time: 07/30/16  3:20 PM  Result Value Ref Range Status   Specimen Description BLOOD RIGHT ARM  Final   Special Requests BOTTLES DRAWN AEROBIC AND ANAEROBIC 5CC  Final   Culture NO GROWTH < 24 HOURS  Final   Report Status PENDING  Incomplete  Blood Culture (routine x 2)     Status: None (Preliminary result)   Collection Time: 07/30/16  3:50 PM  Result Value Ref Range Status   Specimen Description BLOOD RIGHT HAND  Final   Special Requests BOTTLES DRAWN AEROBIC AND ANAEROBIC 5CC  Final   Culture NO GROWTH < 24 HOURS  Final   Report Status PENDING  Incomplete  MRSA PCR Screening     Status: None   Collection Time: 07/31/16 12:06 AM  Result Value Ref Range Status   MRSA by PCR NEGATIVE NEGATIVE Final    Comment:        The GeneXpert MRSA Assay (FDA approved for NASAL specimens only), is one component of a comprehensive MRSA colonization surveillance program. It is not intended to diagnose MRSA infection nor to guide  or monitor treatment for MRSA infections.          Radiology Studies: Dg Chest Port 1 View  Result Date: 07/30/2016 CLINICAL DATA:  Cough and shortness of breath EXAM: PORTABLE CHEST 1 VIEW COMPARISON:  Chest x-ray of July 19, 2015 and chest CT scan of the same date. FINDINGS: The lungs are well-expanded. There is increased density obscuring the right heart border more conspicuous than on the previous study. The interstitial markings elsewhere are coarse. There is no significant pleural effusion. The cardiac silhouette remains enlarged. The pulmonary vascularity is not engorged. IMPRESSION: Abnormal density in the right lower hemithorax more conspicuous than in the past. The right heart border is obscured. This may reflect postobstructive atelectasis or pneumonia. Repeat chest CT scanning is recommended in an effort to exclude malignancy. Electronically Signed    By: David  Martinique M.D.   On: 07/30/2016 13:10        Scheduled Meds: . apixaban  5 mg Oral BID  . bisacodyl  5 mg Oral Daily  . ceFEPime (MAXIPIME) IV  1 g Intravenous Q24H  . diltiazem  360 mg Oral Daily  . ferrous sulfate  325 mg Oral Q breakfast  . furosemide  40 mg Intravenous BID  . insulin aspart  0-15 Units Subcutaneous TID WC  . insulin aspart  0-5 Units Subcutaneous QHS  . ipratropium-albuterol  3 mL Nebulization TID  . levothyroxine  125 mcg Oral QAC breakfast  . lisinopril  5 mg Oral Daily  . potassium chloride SA  20 mEq Oral Daily  . potassium chloride  40 mEq Oral Daily  . senna-docusate  1 tablet Oral BID  . sodium chloride flush  3 mL Intravenous Q12H  . vancomycin  1,000 mg Intravenous Q24H   Continuous Infusions:   LOS: 0 days    Jash Wahlen Tanna Furry, MD [facility] Pager [phone]  If 7PM-7AM, please contact night-coverage [email or web address] Password [email or web address] 07/31/2016, 4:58 PM

## 2016-07-31 NOTE — Consult Note (Signed)
[place] consulted, however after review of the chart and discussion with bedside nurse patient only need replacement of her Unna's boots.  I have added orders for ortho tech to change every Tuesday.  Ordered 4" [place] for delivery to the unit.  Bedside nurse to page ortho tech when supplies arrive to the floor.  Discussed POC with bedside nurse.  Re consult if needed, will not follow at this time. Thanks  Joah Patlan [employer], RN,CWOCN, CNS [phone])

## 2016-07-31 NOTE — Progress Notes (Signed)
Orthopedic Tech Progress Note Patient Details:  [name] April 17, 1920 [id number]  Ortho Devices Type of Ortho Device: Louretta Parma boot Ortho Device/Splint Location: bilateral Ortho Device/Splint Interventions: Application   Chamia Schmutz 07/31/2016, 10:05 AM

## 2016-07-31 NOTE — Care Management Obs Status (Signed)
MEDICARE OBSERVATION STATUS NOTIFICATION   Patient Details  Name: [name] MRN: [id number] Date of Birth: [birth year]   Medicare Observation Status Notification Given:  Yes    Carles Collet, RN 07/31/2016, 1:44 PM

## 2016-08-01 ENCOUNTER — Inpatient Hospital Stay (HOSPITAL_COMMUNITY): Payer: Medicare Other

## 2016-08-01 DIAGNOSIS — R600 Localized edema: Secondary | ICD-10-CM

## 2016-08-01 DIAGNOSIS — I482 Chronic atrial fibrillation: Secondary | ICD-10-CM

## 2016-08-01 DIAGNOSIS — I5032 Chronic diastolic (congestive) heart failure: Secondary | ICD-10-CM

## 2016-08-01 DIAGNOSIS — J9601 Acute respiratory failure with hypoxia: Secondary | ICD-10-CM

## 2016-08-01 DIAGNOSIS — I872 Venous insufficiency (chronic) (peripheral): Secondary | ICD-10-CM

## 2016-08-01 DIAGNOSIS — I10 Essential (primary) hypertension: Secondary | ICD-10-CM

## 2016-08-01 LAB — RENAL FUNCTION PANEL
Albumin: 3.6 g/dL (ref 3.5–5.0)
Anion gap: 10 (ref 5–15)
BUN: 18 mg/dL (ref 6–20)
CALCIUM: 8.9 mg/dL (ref 8.9–10.3)
CHLORIDE: 102 mmol/L (ref 101–111)
CO2: 29 mmol/L (ref 22–32)
CREATININE: 0.68 mg/dL (ref 0.44–1.00)
GFR calc Af Amer: >60 mL/min (ref >60)
GFR calc non Af Amer: >60 mL/min (ref >60)
GLUCOSE: 117 mg/dL — AB (ref 65–99)
Phosphorus: 3.5 mg/dL (ref 2.5–4.6)
Potassium: 3.9 mmol/L (ref 3.5–5.1)
SODIUM: 141 mmol/L (ref 135–145)

## 2016-08-01 LAB — CBC
HCT: 46.6 % — ABNORMAL HIGH (ref 36.0–46.0)
Hemoglobin: 14.3 g/dL (ref 12.0–15.0)
MCH: 30 pg (ref 26.0–34.0)
MCHC: 30.7 g/dL (ref 30.0–36.0)
MCV: 97.9 fL (ref 78.0–100.0)
PLATELETS: 130 10*3/uL — AB (ref 150–400)
RBC: 4.76 MIL/uL (ref 3.87–5.11)
RDW: 15.7 % — AB (ref 11.5–15.5)
WBC: 8.9 10*3/uL (ref 4.0–10.5)

## 2016-08-01 LAB — GLUCOSE, CAPILLARY
GLUCOSE-CAPILLARY: 117 mg/dL — AB (ref 65–99)
Glucose-Capillary: 93 mg/dL (ref 65–99)
Glucose-Capillary: 113 mg/dL — ABNORMAL HIGH (ref 65–99)
Glucose-Capillary: 128 mg/dL — ABNORMAL HIGH (ref 65–99)

## 2016-08-01 LAB — HEMOGLOBIN A1C
Hgb A1c MFr Bld: 5.6 % (ref 4.8–5.6)
MEAN PLASMA GLUCOSE: 114 mg/dL

## 2016-08-01 MED ORDER — SODIUM CHLORIDE 0.9% FLUSH: 10.0000 mL | INTRAVENOUS | Status: DC | PRN

## 2016-08-01 MED ORDER — FUROSEMIDE 10 MG/ML IJ SOLN: 80.0000 mg | Freq: Once | INTRAMUSCULAR | Status: DC

## 2016-08-01 MED ORDER — METOPROLOL TARTRATE 25 MG PO TABS
25.0000 mg | ORAL_TABLET | Freq: Two times a day (BID) | ORAL | Status: DC
  Administered 2016-08-01 – 2016-08-05 (×8): 25 mg via ORAL
  Filled 2016-08-01 (×9): qty 1

## 2016-08-01 MED ORDER — FUROSEMIDE 10 MG/ML IJ SOLN
40.0000 mg | Freq: Once | INTRAMUSCULAR | Status: AC
  Administered 2016-08-01: 40 mg via INTRAVENOUS
  Filled 2016-08-01: qty 4

## 2016-08-01 MED ORDER — SODIUM CHLORIDE 0.9% FLUSH
10.0000 mL | Freq: Two times a day (BID) | INTRAVENOUS | Status: DC
Start: 2016-08-01 — End: 2016-08-09
  Administered 2016-08-01 – 2016-08-02 (×3): 10 mL
  Administered 2016-08-03: 30 mL
  Administered 2016-08-03 – 2016-08-04 (×2): 10 mL
  Administered 2016-08-04: 20 mL
  Administered 2016-08-05 – 2016-08-08 (×4): 10 mL

## 2016-08-01 MED ORDER — POTASSIUM CHLORIDE CRYS ER 20 MEQ PO TBCR
20.0000 meq | EXTENDED_RELEASE_TABLET | Freq: Two times a day (BID) | ORAL | Status: DC
  Administered 2016-08-01: 20 meq via ORAL
  Filled 2016-08-01: qty 1

## 2016-08-01 MED ORDER — FUROSEMIDE 10 MG/ML IJ SOLN: 60.0000 mg | Freq: Three times a day (TID) | INTRAMUSCULAR | Status: DC

## 2016-08-01 MED ORDER — POTASSIUM CHLORIDE CRYS ER 20 MEQ PO TBCR: 40.0000 meq | EXTENDED_RELEASE_TABLET | Freq: Every day | ORAL | Status: DC

## 2016-08-01 MED ORDER — FUROSEMIDE 10 MG/ML IJ SOLN
60.0000 mg | Freq: Four times a day (QID) | INTRAMUSCULAR | Status: DC
  Administered 2016-08-02 – 2016-08-03 (×6): 60 mg via INTRAVENOUS
  Filled 2016-08-01 (×6): qty 6

## 2016-08-01 MED ORDER — FUROSEMIDE 10 MG/ML IJ SOLN
60.0000 mg | Freq: Two times a day (BID) | INTRAMUSCULAR | Status: DC
  Administered 2016-08-01: 60 mg via INTRAVENOUS
  Filled 2016-08-01: qty 6

## 2016-08-01 MED ORDER — FUROSEMIDE 10 MG/ML IJ SOLN
60.0000 mg | Freq: Four times a day (QID) | INTRAMUSCULAR | Status: DC
  Administered 2016-08-01: 60 mg via INTRAVENOUS
  Filled 2016-08-01: qty 6

## 2016-08-01 NOTE — Progress Notes (Addendum)
OT Cancellation Note/PT Cancellation Note  Patient Details Name: [name] MRN: [id number] DOB: December 05, 1919   Cancelled Treatment:    Reason Eval/Treat Not Completed: Patient not medically ready (transferring RN requesting hold at this time) Pt currently with respiratory deficits requiring 6 L [place] high flow and pending transfer off 6e. RN requesting therapy hold at this time due to respiratory decline  [name]   OTR/L Pager: [phone] Office: [phone] .08/01/2016  Donnella Sham, [name] [phone]  (pager)  08/01/2016, 10:27 AM

## 2016-08-01 NOTE — Progress Notes (Signed)
Pt woke up from sleep having difficulty breathing. O2 saturation was 77% on 3L, bumped up to 6L with no improvement. Respiratory called for breathing treatment. While waiting for RT placed pt on nonrebreather and pt came up to 98%. Breathing treatment administered by RT and pt now on 6L high flow nasal cannula. MD notified and lasix ordered and given. Pt currently has O2 at 94%. Will continue to monitor.

## 2016-08-01 NOTE — Progress Notes (Addendum)
Charge nurse packed pts belongings and transferred pt to [facility] notified pts family member via phone of new assigned floor and room number. Pt transferred with 6L High Flow O2.

## 2016-08-01 NOTE — Progress Notes (Signed)
Spoke with Dr. Carolin Sicks regarding PICC at mid [facility].  Dr. Carolin Sicks does not wish for IV team to exchange PICC for tip placement at [facility] at this time.  Carolee Rota, RN VAST

## 2016-08-01 NOTE — Discharge Instructions (Signed)

## 2016-08-01 NOTE — Consult Note (Signed)
            [facility] CM Primary Care Navigator  08/01/2016  [name] 1919/12/05 [id number]   Went to see patient at thebedside to identify possible discharge needsbut staff states that patient was transferred earlier to stepdown 3W 14 (from 6E 07).  Will try another time to meet with patient when available at her new location.   For additional questions please contact:  Edwena Felty A. Ma Munoz, BSN, RN-BC [facility] PRIMARY CARE Navigator Cell: [phone]

## 2016-08-01 NOTE — Progress Notes (Signed)
PROGRESS NOTE    [name]  [id number] DOB: Oct 22, 1919 DOA: 07/30/2016 PCP: [name], MD   Brief Narrative: 83 y.o. female with medical history significant   diastolic heart failure, A. fib, obstructive sleep apnea on C Pap, 16 bowel obstructions in the last 5 years, ovarian cancer, bladder cancer, hypertension, hypothyroidism venous insufficiency presents to the emergency department from the facility with chief complaint sudden worsening shortness of breath and gradual worsening lower extremity edema. So evaluation yields acute respiratory failure hypoxia likely related to healthcare associated pneumonia in the setting of mild CHF exacerbation.  Assessment & Plan:  # Acute hypoxic respiratory failure likely in the setting of healthcare associated pneumonia and acute on chronic diastolic congestive heart failure: -CT chest with cardiomegaly and moderate pericardial effusion. Also has bilateral pleural effusion. -Patient had acute respiratory distress with worsening hypoxia this morning likely due to pulmonary edema. Patient required high flow oxygen this morning. Stat chest x-ray consistent with CHF. Patient received 40 mg of IV Lasix this morning. I have changed Lasix IV to 60 mg IV every 6 hourly. Cardiology consult requested and he spoke with them. -Patient has a Foley catheter, strict ins and outs. -Because of respiratory distress, will transfer patient to stepdown unit. -Continue vancomycin and cefepime for possible healthcare associated pneumonia. -Follow-up culture results.  -Continue bronchodilators.  #Healthcare associated pneumonia: Continue antibiotics. Follow culture. Influenza negative.  # Acute on chronic congestive heart failure with preserved EF: Patient had an echocardiogram about 2 weeks ago which was consistent with EF of 55-60%. -Increased Lasix. Follow-up cardiologist evaluation. -Mild elevation in troponin likely due to congestive heart  failure. -Continue potassium chloride, lisinopril.  #Atrial fibrillation, paroxysmal: Continue diltiazem, allegories. Currently on telemetry. Continue to monitor closely.  #Lateral lower extremity edema: Probably exacerbated by CHF. On Lasix.  -Unna boot, wound care  #Diabetes: Continue sliding scale. Monitor blood sugar level.  #hypertension: Monitor blood pressure.  #History of bowel obstruction: Continue with the bowel regimen. Watch for bowel movement closely.  PT, OT evaluation.  Principal Problem:   Acute respiratory failure with hypoxia (HCC) Active Problems:   Chronic diastolic heart failure (HCC)   Atrial fibrillation (HCC)   Hypokalemia   Diabetes mellitus (HCC)   Hypothyroidism   Venous insufficiency   Hyperlipidemia   PNA (pneumonia)   Essential hypertension   Bowel obstruction   HCAP (healthcare-associated pneumonia)   Bilateral leg edema   Diabetes mellitus with complication (HCC)  DVT prophylaxis:Systemic anticoagulation  Code Status:Partial DO NOT RESUSCITATE.  Family Communication:No family present at bedside. Discussed with the patient's nurse, respiratory therapist and care management team in detail. Disposition Plan: Transferring to stepdown unit. Likely discharge home versus rehabilitation in 2-3 days..  Consultants:   None  Procedures:None  Antimicrobials:Vancomycin and cefepime  Subjective:Patient was seen and examined at bedside. Early morning even noted. Patient with respiratory distress. Clinical shortness of breath and chest tightness. No cough, nausea, vomiting. Has Foley catheter. Requiring high flow oxygen.  Objective: Vitals:   07/31/16 2037 08/01/16 0544 08/01/16 0850 08/01/16 0958  BP: 108/63 138/66  (!) 149/80  Pulse: 96 90  86  Resp: 20 (!) 23  (!) 22  Temp: 97.6 F (36.4 C) 97.6 F (36.4 C)  98.3 F (36.8 C)  TempSrc:    Oral  SpO2: 92% 100% 100% 96%  Weight:      Height:        Intake/Output Summary (Last 24 hours) at  08/01/16 1030 Last data filed at 08/01/16  [id number]  Gross per 24 hour  Intake              670 ml  Output             2925 ml  Net            -2255 ml   Filed Weights   07/30/16 1245 07/30/16 2344  Weight: 99.8 kg (220 lb) 99.8 kg (220 lb)    Examination:  General exam: Elderly female lying in bed, mild respiratory distress  Respiratory system: Bibasal decreased breath sound with rhonchi and diffuse expiratory wheeze. Cardiovascular system: Regular rate rhythm, S1 and S2 normal..  Gastrointestinal system: Abdomen soft, nontender, nondistended. Bowel sound positive. Central nervous system: Alert awake, following commands.  Extremities:Lateral lower extremity with dressing on, edema+ Skin: No rashes, lesions or ulcers Psychiatry: Judgement and insight appear normal.   Foley catheter with clear urine.    Data Reviewed: I have personally reviewed following labs and imaging studies  CBC:  Recent Labs Lab 07/30/16 1459 07/31/16 0623 08/01/16 0642  WBC 5.5 3.3* 8.9  NEUTROABS 5.0  --   --   HGB 13.8 13.0 14.3  HCT 43.4 41.1 46.6*  MCV 94.3 95.1 97.9  PLT 118* 125* [id number]*   Basic Metabolic Panel:  Recent Labs Lab 07/30/16 1459 07/31/16 0623 08/01/16 0642  NA 141 143 141  K 3.2* 3.6 3.9  CL 102 105 102  CO2 27 30 29   GLUCOSE 196* 128* 117*  BUN 11 11 18   CREATININE 0.78 0.61 0.68  CALCIUM 8.5* 8.7* 8.9  PHOS  --   --  3.5   GFR: Estimated Creatinine Clearance: 46.6 mL/min (by C-G formula based on SCr of 0.68 mg/dL). Liver Function Tests:  Recent Labs Lab 07/30/16 1459 08/01/16 0642  AST 22  --   ALT 11*  --   ALKPHOS 47  --   BILITOT 0.5  --   PROT 6.2*  --   ALBUMIN 3.7 3.6   No results for input(s): LIPASE, AMYLASE in the last 168 hours. No results for input(s): AMMONIA in the last 168 hours. Coagulation Profile: No results for input(s): INR, PROTIME in the last 168 hours. Cardiac Enzymes: No results for input(s): CKTOTAL, CKMB, CKMBINDEX, TROPONINI in  the last 168 hours. BNP (last 3 results) No results for input(s): PROBNP in the last 8760 hours. HbA1C:  Recent Labs  07/31/16 0623  HGBA1C 5.6   CBG:  Recent Labs Lab 07/31/16 0756 07/31/16 1141 07/31/16 1647 07/31/16 2143 08/01/16 0809  GLUCAP 119* 143* 112* 203* 93   Lipid Profile: No results for input(s): CHOL, HDL, LDLCALC, TRIG, CHOLHDL, LDLDIRECT in the last 72 hours. Thyroid Function Tests: No results for input(s): TSH, T4TOTAL, FREET4, T3FREE, THYROIDAB in the last 72 hours. Anemia Panel: No results for input(s): VITAMINB12, FOLATE, FERRITIN, TIBC, IRON, RETICCTPCT in the last 72 hours. Sepsis Labs:  Recent Labs Lab 07/30/16 1524  LATICACIDVEN 1.73    Recent Results (from the past 240 hour(s))  Blood Culture (routine x 2)     Status: None (Preliminary result)   Collection Time: 07/30/16  3:20 PM  Result Value Ref Range Status   Specimen Description BLOOD RIGHT ARM  Final   Special Requests BOTTLES DRAWN AEROBIC AND ANAEROBIC 5CC  Final   Culture NO GROWTH < 24 HOURS  Final   Report Status PENDING  Incomplete  Blood Culture (routine x 2)     Status: None (Preliminary result)   Collection Time: 07/30/16  3:50 PM  Result Value Ref Range Status   Specimen Description BLOOD RIGHT HAND  Final   Special Requests BOTTLES DRAWN AEROBIC AND ANAEROBIC 5CC  Final   Culture NO GROWTH < 24 HOURS  Final   Report Status PENDING  Incomplete  MRSA PCR Screening     Status: None   Collection Time: 07/31/16 12:06 AM  Result Value Ref Range Status   MRSA by PCR NEGATIVE NEGATIVE Final    Comment:        The GeneXpert MRSA Assay (FDA approved for NASAL specimens only), is one component of a comprehensive MRSA colonization surveillance program. It is not intended to diagnose MRSA infection nor to guide or monitor treatment for MRSA infections.          Radiology Studies: Ct Chest Wo Contrast  Result Date: 07/31/2016 CLINICAL DATA:  Cough, RIGHT lung  atelectasis EXAM: CT CHEST WITHOUT CONTRAST TECHNIQUE: Multidetector CT imaging of the chest was performed following the standard protocol without IV contrast. Sagittal and coronal MPR images reconstructed from axial data set. COMPARISON:  07/19/2015; chest radiograph 07/30/2016 FINDINGS: Cardiovascular: Atherosclerotic calcifications aorta, proximal great vessels and coronary arteries. Moderate pericardial effusion. Enlargement of cardiac chambers. Pulmonary vascular congestion and dilatation of central pulmonary arteries. Normal caliber thoracic aorta. Mediastinum/Nodes: Unremarkable esophagus. No thoracic adenopathy, with scattered normal sized mediastinal lymph nodes noted. Base of cervical region unremarkable. Lungs/Pleura: Small BILATERAL pleural effusions larger on RIGHT. Peribronchial thickening. Scattered atelectasis in BILATERAL lower lobes. No acute infiltrate or pneumothorax. Upper Abdomen: Cirrhotic liver.  Otherwise unremarkable. Musculoskeletal: Scattered degenerative changes thoracic spine. Several bone islands lower thoracic spine stable. Diffuse osseous demineralization. IMPRESSION: Enlargement of cardiac chambers with moderate pericardial effusion. Suspect pulmonary arterial hypertension. Aortic atherosclerosis and coronary arterial calcifications. BILATERAL pleural effusions with scattered atelectasis in the lower lobes bilaterally. Cirrhotic liver. Electronically Signed   By: Lavonia Dana M.D.   On: 07/31/2016 17:59   Dg Chest Port 1 View  Result Date: 08/01/2016 CLINICAL DATA:  Shortness of breath.  Wheezing. EXAM: PORTABLE CHEST 1 VIEW COMPARISON:  CT 07/31/2016.  Chest x-ray 07/30/2016 . FINDINGS: Cardiomegaly with pulmonary vascular prominence and bilateral interstitial prominence with with small bilateral pleural effusions. Low lung volumes. Bibasilar atelectasis. Bibasilar pneumonia cannot be excluded. No pneumothorax. IMPRESSION: 1. Cardiomegaly with pulmonary vascular prominence and  bilateral interstitial prominence with bilateral small pleural effusions. Findings consistent with congestive heart failure . 2. Low lung volumes with bibasilar atelectasis. Bibasilar pneumonia cannot be excluded . Electronically Signed   By: Marcello Moores  Register   On: 08/01/2016 09:40   Dg Chest Port 1 View  Result Date: 07/30/2016 CLINICAL DATA:  Cough and shortness of breath EXAM: PORTABLE CHEST 1 VIEW COMPARISON:  Chest x-ray of July 19, 2015 and chest CT scan of the same date. FINDINGS: The lungs are well-expanded. There is increased density obscuring the right heart border more conspicuous than on the previous study. The interstitial markings elsewhere are coarse. There is no significant pleural effusion. The cardiac silhouette remains enlarged. The pulmonary vascularity is not engorged. IMPRESSION: Abnormal density in the right lower hemithorax more conspicuous than in the past. The right heart border is obscured. This may reflect postobstructive atelectasis or pneumonia. Repeat chest CT scanning is recommended in an effort to exclude malignancy. Electronically Signed   By: David  Martinique M.D.   On: 07/30/2016 13:10        Scheduled Meds: . apixaban  5 mg Oral BID  . bisacodyl  5 mg Oral  Daily  . ceFEPime (MAXIPIME) IV  1 g Intravenous Q24H  . diltiazem  360 mg Oral Daily  . ferrous sulfate  325 mg Oral Q breakfast  . furosemide  60 mg Intravenous Q6H  . furosemide  80 mg Intravenous Once  . insulin aspart  0-15 Units Subcutaneous TID WC  . insulin aspart  0-5 Units Subcutaneous QHS  . ipratropium-albuterol  3 mL Nebulization TID  . levothyroxine  125 mcg Oral QAC breakfast  . lisinopril  5 mg Oral Daily  . metoprolol tartrate  25 mg Oral BID  . potassium chloride SA  20 mEq Oral Daily  . senna-docusate  1 tablet Oral BID  . sodium chloride flush  3 mL Intravenous Q12H  . vancomycin  1,000 mg Intravenous Q24H   Continuous Infusions:   LOS: 1 day    Maddix Heinz Tanna Furry,  MD [facility] Pager [phone]  If 7PM-7AM, please contact night-coverage [email or web address] Password [email or web address] 08/01/2016, 10:30 AM

## 2016-08-01 NOTE — Progress Notes (Signed)
Peripherally Inserted Central Catheter/Midline Placement  The IV Nurse has discussed with the patient and/or persons authorized to consent for the patient, the purpose of this procedure and the potential benefits and risks involved with this procedure.  The benefits include less needle sticks, lab draws from the catheter, and the patient may be discharged home with the catheter. Risks include, but not limited to, infection, bleeding, blood clot (thrombus formation), and puncture of an artery; nerve damage and irregular heartbeat and possibility to perform a PICC exchange if needed/ordered by physician.  Alternatives to this procedure were also discussed.  B[employer] PICC patient education guide, fact sheet on infection prevention and patient information card has been provided to patient /or left at bedside.    PICC/Midline Placement Documentation  PICC Double Lumen 0000000 PICC Right Basilic 38 cm 0 cm (Active)  Indication for Insertion or Continuance of Line Poor Vasculature-patient has had multiple peripheral attempts or PIVs lasting less than 24 hours 08/01/2016  3:00 PM  Exposed Catheter (cm) 0 cm 08/01/2016  3:00 PM  Dressing Change Due 08/08/16 08/01/2016  3:00 PM       Jule Economy Horton 08/01/2016, 3:52 PM

## 2016-08-01 NOTE — Consult Note (Signed)
Cardiology Consult    Patient ID: [name] MRN: [id number], DOB/AGE: 1920-04-12   Admit date: 07/30/2016 Date of Consult: 08/01/2016  Primary Physician: [name], MD Reason for Consult: CHF Primary Cardiologist: Dr. Rayann Heman Requesting Provider: Dr. Carolin Sicks   Patient Profile    [name] is a 83 year old female with a past medical history of permanent Afib (On Eliquis), HTN, chronic diastolic CHF, HLD, OSA, and DM. Also with history of cancer - bladder, breast and ovarian. She presented with acute on chronic diastolic CHF and HCAP.   History of Present Illness    [name] resides as an assisted living facility and her daughter reports that about a week ago the patient was seen by her PCP for bilateral lower extremity edema and SOB. Her PCP increased her lasix to 60mg  daily (from 40mg  daily) and applied unna boots. She has not diuresed well since then according to her daughter.   She was hypoxic in the ED, O2 level was 87% on room air. Chest X ray with right lower lobe density may reflect malignancy vs. PNA/atelectasis. She was placed on antibiotics per the primary team.   BNP was 184. EKG showed atrial fibrillation with rate of 93. Her last Echo was 2 weeks ago with a LVEF of 55-60%, no wall motion abnormalities, Moderate AS, and biatrial enlargement.   At the time of my encounter, she has audible expiratory wheezing, labored breathing and appears anxious.   Past Medical History   Past Medical History:  Diagnosis Date  . Bladder cancer ([place])   . Breast cancer ([place])   . Cervical cancer ([place])   . Chronic diastolic heart failure ([place])    a. Echo 5/12: Mild LVH, EF 55-60%, mild AI, mild MR, severe LAE, mild RAE, PASP 31, small pericardial effusion  . Colon cancer ([place])    "polyp" (12/08/2012)  . Diastolic CHF, chronic ([place]) 11/11/2012   Class 2b-3 2 d echo 5/12 mild LVH EF 55-50%, MILD AI, MILD MR, SEVERE LAE, mild RAE, PASP 31, SMALL PERICRADIAL EFFUSION  . DM2  (diabetes mellitus, type 2) ([place])    TYPE 2  . H/O ovarian cancer 11/11/2012    MUCINOUS CYST S/P RESECTION 35 YEARS AGO  . HLD (hyperlipidemia)   . Hx of bladder cancer 11/11/2012  . Hx of cervical cancer 11/11/2012  . Hypertension   . Hypothyroidism   . OSA (obstructive sleep apnea)   . Ovarian cancer ([place])   . Permanent atrial fibrillation ([facility])    a. coumadin d/c'd => Pradaxa in 05/2012  . S/P hysterectomy 11/11/2012  . SBO (small bowel obstruction)   . SBO (small bowel obstruction) 08/25/2014  . Venous insufficiency    chronic LE edema    Past Surgical History:  Procedure Laterality Date  . APPENDECTOMY    . BLADDER SURGERY    . BREAST BIOPSY Bilateral   . BREAST LUMPECTOMY Right   . CATARACT EXTRACTION W/ INTRAOCULAR LENS  IMPLANT, BILATERAL    . EXPLORATORY LAPAROTOMY WITH ABDOMINAL MASS EXCISION     "21# ovarian tumor; benign" (12/08/2012)  . I&D EXTREMITY Right 12/31/2012   Procedure: IRRIGATION AND DEBRIDEMENT RIGHT KNEE ULCER WITH PLACEMENT OF A CELL AND VAC ;  Surgeon: Theodoro Kos, DO;  Location: [facility];  Service: Plastics;  Laterality: Right;  . MASTECTOMY, RADICAL Left   . VAGINAL HYSTERECTOMY       Allergies  Allergies  Allergen Reactions  . Morphine And Related Other (See Comments)  sick  . Statins Other (See Comments)    sick  . Zetia [Ezetimibe] Other (See Comments)    Side effect too strong   . Codeine Other (See Comments)    Inpatient Medications    . apixaban  5 mg Oral BID  . bisacodyl  5 mg Oral Daily  . ceFEPime (MAXIPIME) IV  1 g Intravenous Q24H  . diltiazem  360 mg Oral Daily  . ferrous sulfate  325 mg Oral Q breakfast  . furosemide  60 mg Intravenous Q8H  . insulin aspart  0-15 Units Subcutaneous TID WC  . insulin aspart  0-5 Units Subcutaneous QHS  . ipratropium-albuterol  3 mL Nebulization TID  . levothyroxine  125 mcg Oral QAC breakfast  . lisinopril  5 mg Oral Daily  . potassium chloride SA  20 mEq Oral Daily  . potassium  chloride  40 mEq Oral Daily  . senna-docusate  1 tablet Oral BID  . sodium chloride flush  3 mL Intravenous Q12H  . vancomycin  1,000 mg Intravenous Q24H    Family History    Family History  Problem Relation Age of Onset  . Family history unknown: Yes    Social History    Social History   Social History  . Marital status: Widowed    Spouse name: N/A  . Number of children: N/A  . Years of education: N/A   Occupational History  . Not on file.   Social History Main Topics  . Smoking status: Former Smoker    Packs/day: 3.00    Years: 25.00    Types: Cigarettes    Quit date: 07/02/1966  . Smokeless tobacco: Never Used  . Alcohol use No  . Drug use: No  . Sexual activity: No   Other Topics Concern  . Not on file   Social History Narrative  . No narrative on file     Review of Systems    General:  No chills, fever, night sweats or weight changes.  Cardiovascular:  No chest pain, + dyspnea on exertion, + edema, + orthopnea, palpitations, paroxysmal nocturnal dyspnea. Dermatological: No rash, lesions/masses Respiratory: No cough, dyspnea Urologic: No hematuria, dysuria Abdominal:   No nausea, vomiting, diarrhea, bright red blood per rectum, melena, or hematemesis Neurologic:  No visual changes, wkns, changes in mental status. All other systems reviewed and are otherwise negative except as noted above.  Physical Exam    Blood pressure 138/66, pulse 90, temperature 97.6 F (36.4 C), resp. rate (!) 23, height 5\' 1"  (1.549 m), weight 220 lb (99.8 kg), SpO2 100 %.  General: Pleasant, NAD Psych: Normal affect. Neuro: Alert and oriented X 3. Moves all extremities spontaneously. HEENT: Normal  Neck: Supple without bruits. Hard to assess JVD.  Lungs:  Resp regular, labored. Audible expiratory wheezing in all lobes.  Heart: RRR no s3, s4, Normal S1 and S2 but 3/6 SEM at RUSB. Also has a component of HSM at apex Abdomen: Soft, non-tender, non-distended, BS + x 4.    Extremities: No clubbing, cyanosis or edema. DP/PT/Radials 2+ and equal bilaterally.  Labs     Lab Results  Component Value Date   WBC 8.9 08/01/2016   HGB 14.3 08/01/2016   HCT 46.6 (H) 08/01/2016   MCV 97.9 08/01/2016   PLT 130 (L) 08/01/2016    Recent Labs Lab 07/30/16 1459  08/01/16 0642  NA 141  < > 141  K 3.2*  < > 3.9  CL 102  < > 102  CO2 27  < > 29  BUN 11  < > 18  CREATININE 0.78  < > 0.68  CALCIUM 8.5*  < > 8.9  PROT 6.2*  --   --   BILITOT 0.5  --   --   ALKPHOS 47  --   --   ALT 11*  --   --   AST 22  --   --   GLUCOSE 196*  < > 117*  < > = values in this interval not displayed.  Radiology Studies    Ct Chest Wo Contrast  Result Date: 07/31/2016 CLINICAL DATA:  Cough, RIGHT lung atelectasis EXAM: CT CHEST WITHOUT CONTRAST TECHNIQUE: Multidetector CT imaging of the chest was performed following the standard protocol without IV contrast. Sagittal and coronal MPR images reconstructed from axial data set. COMPARISON:  07/19/2015; chest radiograph 07/30/2016 FINDINGS: Cardiovascular: Atherosclerotic calcifications aorta, proximal great vessels and coronary arteries. Moderate pericardial effusion. Enlargement of cardiac chambers. Pulmonary vascular congestion and dilatation of central pulmonary arteries. Normal caliber thoracic aorta. Mediastinum/Nodes: Unremarkable esophagus. No thoracic adenopathy, with scattered normal sized mediastinal lymph nodes noted. Base of cervical region unremarkable. Lungs/Pleura: Small BILATERAL pleural effusions larger on RIGHT. Peribronchial thickening. Scattered atelectasis in BILATERAL lower lobes. No acute infiltrate or pneumothorax. Upper Abdomen: Cirrhotic liver.  Otherwise unremarkable. Musculoskeletal: Scattered degenerative changes thoracic spine. Several bone islands lower thoracic spine stable. Diffuse osseous demineralization. IMPRESSION: Enlargement of cardiac chambers with moderate pericardial effusion. Suspect pulmonary  arterial hypertension. Aortic atherosclerosis and coronary arterial calcifications. BILATERAL pleural effusions with scattered atelectasis in the lower lobes bilaterally. Cirrhotic liver. Electronically Signed   By: Lavonia Dana M.D.   On: 07/31/2016 17:59   Dg Chest Port 1 View  Result Date: 07/30/2016 CLINICAL DATA:  Cough and shortness of breath EXAM: PORTABLE CHEST 1 VIEW COMPARISON:  Chest x-ray of July 19, 2015 and chest CT scan of the same date. FINDINGS: The lungs are well-expanded. There is increased density obscuring the right heart border more conspicuous than on the previous study. The interstitial markings elsewhere are coarse. There is no significant pleural effusion. The cardiac silhouette remains enlarged. The pulmonary vascularity is not engorged. IMPRESSION: Abnormal density in the right lower hemithorax more conspicuous than in the past. The right heart border is obscured. This may reflect postobstructive atelectasis or pneumonia. Repeat chest CT scanning is recommended in an effort to exclude malignancy. Electronically Signed   By: Joal Eakle  [place] M.D.   On: 07/30/2016 13:10    EKG & Cardiac Imaging    EKG: Afib with rate of 93.   Echocardiogram: 07/19/16 Study Conclusions  - Left ventricle: The cavity size was mildly dilated. Wall   thickness was increased in a pattern of mild LVH. Systolic   function was normal. The estimated ejection fraction was in the   range of 55% to 60%. Wall motion was normal; there were no   regional wall motion abnormalities. - Aortic valve: Valve mobility was restricted. There was moderate   stenosis. - Mitral valve: Calcified annulus. Mildly thickened leaflets .   There was moderate regurgitation. - Left atrium: The atrium was severely dilated. - Right atrium: The atrium was mildly dilated. - Pulmonary arteries: Systolic pressure was moderately increased.   PA peak pressure: 61 mm Hg (S). - Pericardium, extracardiac: A small pericardial  effusion was   identified.  Impressions:  - Normal LV systolic function; biatrial enlargement; heavily   calcified aortic valve with moderate AS by mean gradient;   moderate  MR; mild TR with moderately elevated pulmonary pressure;   small pericardial effusion.   Assessment & Plan    1. Acute hypoxic respiratory failure/ Acute on chronic diastolic CHF: Patient on 6L nasal cannula, o2 sats ok, but appears anxious and uncomfortable. Her chest X ray shows edema.   Some of her respiratory issues can be attributed to possible HCAP, however she does appear volume overloaded as well. BLE edema, protuberant abdomen and orthopneic.   Will give one dose of 80mg  IV lasix now then schedule IV lasix 60mg  q 6 hours starting this afternoon.   2. Permanent atrial fibrillation: Review of tele shows some rapid rates likely related to the infectious process going on.   On 360mg  Cardizem. Will add 25mg  metoprolol BID for added rate control.   Signed, Arbutus Leas, NP 08/01/2016, 9:19 AM Pager: [phone]   I have seen, examined and evaluated the patient this AM along with Erin Smith,NP.  After reviewing all the available data and chart, we discussed the patients laboratory, study & physical findings as well as symptoms in detail. I agree with her findings, examination as well as impression recommendations as per our discussion.    Very pleasant elderly woman with chronic diastolic heart failure and atrial fibrillation with variable rate. Her atrial fibrillation is permanent and she is on anticoagulation. I agree with adding a beta blocker for some rate control him since she has had spells of rapid A. Fib - will be in addition to her diltiazem dose.   She looks that she has chronic lower extremity edema and was admitted with what was possibly a pneumonia versus heart failure exacerbation. Her chest x-ray shows bilateral pleural effusions with atelectasis. The appearances consistent with heart failure  and possible pneumonia by chest CT.  Earlier this morning she became very hypoxic and lethargic. She has received a significant amount of Lasix over the last 12 hours and is brisk urine output. She is currently now on nasal cannula. She looks very tired and lethargic with increased worker breathing slumped in the bed. She has decreased breath sounds in bilateral bases with rales and significant lotion or edema with the Unna boot application.  I think standing Lasix is probably reasonable.  Would like to see her blood pressure does with the addition of beta blocker before considering additional heart failure medications. Strongly consider using CPAP or even BiPAP at least at nighttime to help with lung expansion - she has sleep apnea listed as a diagnosis.   Apparently she is being transferred to the stepdown unit on 3 W. We will continue to follow along.    [name], M.D., M.S. Interventional Cardiologist   Pager # [phone] Phone # [phone] [address] [place], [place] [address]

## 2016-08-01 NOTE — Progress Notes (Signed)
Called Report to [facility] on 3W. Pt will transfer to room as soon as it is clean. Notified charge nurse. Will move pt as soon as bed is available. Will continue to monitor.

## 2016-08-02 DIAGNOSIS — Z7189 Other specified counseling: Secondary | ICD-10-CM

## 2016-08-02 DIAGNOSIS — E876 Hypokalemia: Secondary | ICD-10-CM

## 2016-08-02 LAB — RENAL FUNCTION PANEL
ALBUMIN: 3.3 g/dL — AB (ref 3.5–5.0)
Anion gap: 9 (ref 5–15)
BUN: 15 mg/dL (ref 6–20)
CHLORIDE: 93 mmol/L — AB (ref 101–111)
CO2: 39 mmol/L — ABNORMAL HIGH (ref 22–32)
CREATININE: 0.7 mg/dL (ref 0.44–1.00)
Calcium: 8.1 mg/dL — ABNORMAL LOW (ref 8.9–10.3)
Glucose, Bld: 102 mg/dL — ABNORMAL HIGH (ref 65–99)
PHOSPHORUS: 3.1 mg/dL (ref 2.5–4.6)
POTASSIUM: 3.3 mmol/L — AB (ref 3.5–5.1)
Sodium: 141 mmol/L (ref 135–145)

## 2016-08-02 LAB — VANCOMYCIN, TROUGH: VANCOMYCIN TR: 9 ug/mL — AB (ref 15–20)

## 2016-08-02 LAB — CBC
HCT: 44.4 % (ref 36.0–46.0)
Hemoglobin: 13.7 g/dL (ref 12.0–15.0)
MCH: 29.5 pg (ref 26.0–34.0)
MCHC: 30.9 g/dL (ref 30.0–36.0)
MCV: 95.5 fL (ref 78.0–100.0)
PLATELETS: 124 10*3/uL — AB (ref 150–400)
RBC: 4.65 MIL/uL (ref 3.87–5.11)
RDW: 15.2 % (ref 11.5–15.5)
WBC: 7 10*3/uL (ref 4.0–10.5)

## 2016-08-02 LAB — GLUCOSE, CAPILLARY
GLUCOSE-CAPILLARY: 133 mg/dL — AB (ref 65–99)
GLUCOSE-CAPILLARY: 80 mg/dL (ref 65–99)
GLUCOSE-CAPILLARY: 128 mg/dL — AB (ref 65–99)
Glucose-Capillary: 91 mg/dL (ref 65–99)

## 2016-08-02 LAB — MAGNESIUM: MAGNESIUM: 1.8 mg/dL (ref 1.7–2.4)

## 2016-08-02 MED ORDER — SODIUM CHLORIDE 0.9 % IV SOLN
30.0000 meq | Freq: Once | INTRAVENOUS | Status: AC
  Administered 2016-08-02: 30 meq via INTRAVENOUS
  Filled 2016-08-02: qty 15

## 2016-08-02 MED ORDER — VANCOMYCIN HCL 10 G IV SOLR: 1250.0000 mg | INTRAVENOUS | Status: DC

## 2016-08-02 MED ORDER — POTASSIUM CHLORIDE CRYS ER 20 MEQ PO TBCR
30.0000 meq | EXTENDED_RELEASE_TABLET | Freq: Once | ORAL | Status: AC
  Administered 2016-08-02: 06:00:00 30 meq via ORAL
  Filled 2016-08-02: qty 1

## 2016-08-02 MED ORDER — VANCOMYCIN HCL 10 G IV SOLR
1250.0000 mg | INTRAVENOUS | Status: DC
  Administered 2016-08-02: 1250 mg via INTRAVENOUS
  Filled 2016-08-02 (×2): qty 1250

## 2016-08-02 MED ORDER — POTASSIUM CHLORIDE CRYS ER 20 MEQ PO TBCR
40.0000 meq | EXTENDED_RELEASE_TABLET | Freq: Two times a day (BID) | ORAL | Status: DC
  Administered 2016-08-02 – 2016-08-04 (×5): 40 meq via ORAL
  Filled 2016-08-02 (×5): qty 2

## 2016-08-02 MED ORDER — POTASSIUM CHLORIDE CRYS ER 20 MEQ PO TBCR: 40.0000 meq | EXTENDED_RELEASE_TABLET | Freq: Once | ORAL | Status: DC

## 2016-08-02 NOTE — Progress Notes (Signed)
Healing Stage 2 pressure ulcer to Left buttock assessed by RN and Daughter at bedside.  Daughter had insisted this was not present upon admission (07/30/2016) but after thoroughly assessing, daughter understands now that this has been present for, most likely, several months.  Pt states it "has been there for a long time".  Pt from Assisted Living facility.  Air mattress is currently being used as well as air cushion when pt up in chair.  Pt compliant with q2 hour repositioning and Sacral Foam dsg to minimize pressure and accelerate healing.

## 2016-08-02 NOTE — Evaluation (Signed)
Physical Therapy Evaluation Patient Details Name: ELLOUISE MANKOWSKI MRN: [id number] DOB: 03-21-1920 Today's Date: 08/02/2016   History of Present Illness  patient is a 83 y/o female with hx of HF, A-fib, ovarian ca, bladder cancer, sleep apnea, DM, breast ca presents with acute on chronic diastolic CHF and HCAP  Clinical Impression  Patient presents with cough, dyspnea on exertion, generalized weakness, deconditioning, impaired sitting balance and overall mobility s/p above. Tolerated standing using stedy for support and tolerated getting into a chair. Limited standing tolerance due to weakness/fatigue. Sp02 improved with sitting upright. Pt very eager to get OOB. Pt from [facility] but not safe to return back there at this time. Would benefit from ST SNF to maximize independence and mobility prior to return home. Will follow acutely.     Follow Up Recommendations SNF    Equipment Recommendations  None recommended by PT    Recommendations for Other Services       Precautions / Restrictions Precautions Precautions: Fall Precaution Comments: watch 02 Restrictions Weight Bearing Restrictions: No      Mobility  Bed Mobility Overal bed mobility: Needs Assistance Bed Mobility: Supine to Sit     Supine to sit: Max assist;HOB elevated     General bed mobility comments: Assist to bring LEs to EOB and elevate trunk to get to EOB. Use of rail. Increased time/effort.  Transfers Overall transfer level: Needs assistance   Transfers: Sit to/from Stand;Stand Pivot Transfers Sit to Stand: Max assist;+2 physical assistance Stand pivot transfers: Total assist (with stedy)       General transfer comment: Pt able to grip front bar on Stedy to assist with pulling up. Stood from [employer], from [facility] x1. Transferred to [facility] and thent o chair via stedy. Tolerated static standing in Stedy during peri care.  Ambulation/Gait                Stairs            Wheelchair Mobility     Modified Rankin (Stroke Patients Only)       Balance Overall balance assessment: Needs assistance Sitting-balance support: Feet supported;Bilateral upper extremity supported Sitting balance-Leahy Scale: Zero Sitting balance - Comments: Requires BUe support and mod A for static sitting balance in bed. Able to sit wthout external support on stedy.   Standing balance support: During functional activity;Bilateral upper extremity supported Standing balance-Leahy Scale: Poor Standing balance comment: Able to perform static standing on stedy with bil knee blocked.                             Pertinent Vitals/Pain Pain Assessment: No/denies pain Faces Pain Scale: No hurt    Home Living Family/patient expects to be discharged to:: Skilled nursing facility (from [facility])                      Prior Function Level of Independence: Needs assistance   Gait / Transfers Assistance Needed: Ambulates with RW; walks around loop daily.  ADL's / Homemaking Assistance Needed: Assist with showers, able to dress self, meals provided by facility        Hand Dominance        Extremity/Trunk Assessment   Upper Extremity Assessment Upper Extremity Assessment: Defer to OT evaluation    Lower Extremity Assessment Lower Extremity Assessment: Generalized weakness    Cervical / Trunk Assessment Cervical / Trunk Assessment: Normal  Communication   Communication: No  difficulties  Cognition Arousal/Alertness: Awake/alert Behavior During Therapy: WFL for tasks assessed/performed Overall Cognitive Status: Within Functional Limits for tasks assessed                      General Comments General comments (skin integrity, edema, etc.): Sp02 ranged from high 80s-low 90s on 6L 02 High flow .    Exercises     Assessment/Plan    PT Assessment Patient needs continued PT services  PT Problem List Decreased strength;Decreased mobility;Cardiopulmonary status  limiting activity;Decreased activity tolerance;Decreased balance          PT Treatment Interventions Gait training;Therapeutic exercise;Therapeutic activities;Patient/family education;Balance training;Functional mobility training    PT Goals (Current goals can be found in the Care Plan section)  Acute Rehab PT Goals Patient Stated Goal: rehab to get better PT Goal Formulation: With patient Time For Goal Achievement: 08/16/16 Potential to Achieve Goals: Good    Frequency Min 2X/week   Barriers to discharge Decreased caregiver support      Co-evaluation   Reason for Co-Treatment: For patient/therapist safety;To address functional/ADL transfers   OT goals addressed during session: ADL's and self-care       End of Session Equipment Utilized During Treatment: Gait belt;Oxygen Activity Tolerance: Patient tolerated treatment well Patient left: in chair;with call bell/phone within reach;with chair alarm set Nurse Communication: Mobility status;Need for lift equipment (stedy left in room)         Time: [id number] PT Time Calculation (min) (ACUTE ONLY): 44 min   Charges:   PT Evaluation $PT Eval Moderate Complexity: 1 Procedure PT Treatments $Therapeutic Activity: 8-22 mins   PT G Codes:        Nasreen Goedecke A Shyanne Mcclary 08/02/2016, 10:27 AM Wray Kearns, PT, DPT [phone]

## 2016-08-02 NOTE — NC FL2 (Signed)
[place] MEDICAID FL2 LEVEL OF CARE SCREENING TOOL     IDENTIFICATION  Patient Name: [name] Birthdate: 02-Jun-1920 Sex: female Admission Date (Current Location): 07/30/2016  [facility] and [place] Number:  Herbalist and Address:  [facility]. [facility], [place] [address], [place], [place] [address]      Provider Number: [id number]  Attending Physician Name and Address:  Rosita Fire, MD  Relative Name and Phone Number:       Current Level of Care: Hospital Recommended Level of Care: [place] Prior Approval Number:    Date Approved/Denied:   PASRR Number: [id number]  Discharge Plan: SNF    Current Diagnoses: Patient Active Problem List   Diagnosis Date Noted  . Goals of care, counseling/discussion   . Diabetes mellitus with complication ([place])   . Bilateral leg edema 07/30/2016  . Acute respiratory failure with hypoxia ([place]) 07/30/2016  . Type A influenza 07/26/2015  . HCAP (healthcare-associated pneumonia) 07/19/2015  . Bowel obstruction 08/25/2014  . Atrial fibrillation with RVR ([place]) 08/25/2014  . Acute renal failure ([place]) 08/25/2014  . Diastolic heart failure ([place]) 08/25/2014  . AKI (acute kidney injury) ([place]) 12/29/2013  . Small bowel obstruction 12/28/2013  . Essential hypertension 03/23/2013  . Hematoma 12/08/2012  . Nausea & vomiting 11/11/2012  . Diabetes mellitus ([place]) 11/11/2012  . Hypothyroidism 11/11/2012  . Hx SBO 11/11/2012  . Colon cancer ([place]) 11/11/2012  . Acute on chronic diastolic congestive heart failure ([place]) 11/11/2012  . Venous insufficiency 11/11/2012  . Hyperlipidemia 11/11/2012  . OSA (obstructive sleep apnea) 11/11/2012  . HX: breast cancer 11/11/2012  . Hx of cervical cancer 11/11/2012  . H/O ovarian cancer 11/11/2012  . Hx of bladder cancer 11/11/2012  . S/P hysterectomy 11/11/2012  . PNA (pneumonia) 11/11/2012  . Hypokalemia 12/09/2011  . SBO (small bowel obstruction)  12/06/2011  . Chronic diastolic heart failure ([place]) 02/17/2011  . Atrial fibrillation ([place]) 02/17/2011    Orientation RESPIRATION BLADDER Height & Weight     Self, Time, Situation, Place  O2 (6L [place]) Incontinent, Indwelling catheter Weight: 215 lb (97.5 kg) Height:  5\' 1"  (154.9 cm)  BEHAVIORAL SYMPTOMS/MOOD NEUROLOGICAL BOWEL NUTRITION STATUS      Continent Diet (see DC summary)  AMBULATORY STATUS COMMUNICATION OF NEEDS Skin   Extensive Assist Verbally PU Stage and Appropriate Care   PU Stage 2 Dressing:  (buttocks, dressing change PRN)                   Personal Care Assistance Level of Assistance  Bathing, Dressing Bathing Assistance: Maximum assistance   Dressing Assistance: Maximum assistance     Functional Limitations Info  Sight, Hearing Sight Info: Impaired Hearing Info: Impaired      SPECIAL CARE FACTORS FREQUENCY  PT (By licensed PT), OT (By licensed OT)     PT Frequency: 5/wk OT Frequency: 5/wk            Contractures      Additional Factors Info  Code Status, Allergies, Insulin Sliding Scale Code Status Info: DNR Allergies Info: Morphine And Related, Statins, Zetia Ezetimibe, Codeine   Insulin Sliding Scale Info: 4/day       Current Medications (08/02/2016):  This is the current hospital active medication list Current Facility-Administered Medications  Medication Dose Route Frequency Provider Last Rate Last Dose  . acetaminophen (TYLENOL) tablet 650 mg  650 mg Oral Q6H PRN Radene Gunning, NP       Or  .  acetaminophen (TYLENOL) suppository 650 mg  650 mg Rectal Q6H PRN Lezlie Octave Black, NP      . albuterol (PROVENTIL) (2.5 MG/3ML) 0.083% nebulizer solution 2.5 mg  2.5 mg Nebulization Q4H PRN Waldemar Dickens, MD      . apixaban Arne [place]) tablet 5 mg  5 mg Oral BID Radene Gunning, NP   5 mg at 08/02/16 1059  . bisacodyl (DULCOLAX) EC tablet 5 mg  5 mg Oral Daily Radene Gunning, NP   5 mg at 07/31/16 1258  . ceFEPIme (MAXIPIME) 1 g in dextrose 5 % 50 mL  IVPB  1 g Intravenous Q24H Otilio Miu, RPH   1 g at 08/01/16 2027  . diltiazem (CARDIZEM CD) 24 hr capsule 360 mg  360 mg Oral Daily Waldemar Dickens, MD   360 mg at 08/02/16 1059  . ferrous sulfate tablet 325 mg  325 mg Oral Q breakfast Radene Gunning, NP   325 mg at 08/02/16 0850  . furosemide (LASIX) injection 60 mg  60 mg Intravenous Q6H Dron Tanna Furry, MD   60 mg at 08/02/16 1319  . HYDROcodone-acetaminophen (NORCO/VICODIN) 5-325 MG per tablet 1-2 tablet  1-2 tablet Oral Q4H PRN Radene Gunning, NP   1 tablet at 07/31/16 1258  . insulin aspart (novoLOG) injection 0-15 Units  0-15 Units Subcutaneous TID WC Radene Gunning, NP   2 Units at 08/02/16 [phone]  . insulin aspart (novoLOG) injection 0-5 Units  0-5 Units Subcutaneous QHS Radene Gunning, NP   2 Units at 07/31/16 2229  . ipratropium-albuterol (DUONEB) 0.5-2.5 (3) MG/3ML nebulizer solution 3 mL  3 mL Nebulization TID Waldemar Dickens, MD   3 mL at 08/02/16 1359  . levothyroxine (SYNTHROID, LEVOTHROID) tablet 125 mcg  125 mcg Oral QAC breakfast Radene Gunning, NP   125 mcg at 08/02/16 0850  . lisinopril (PRINIVIL,ZESTRIL) tablet 5 mg  5 mg Oral Daily Lezlie Octave Black, NP   5 mg at 08/02/16 1059  . metoprolol tartrate (LOPRESSOR) tablet 25 mg  25 mg Oral BID Arbutus Leas, NP   25 mg at 08/02/16 1059  . ondansetron (ZOFRAN) tablet 4 mg  4 mg Oral Q6H PRN Radene Gunning, NP       Or  . ondansetron [facility]) injection 4 mg  4 mg Intravenous Q6H PRN Lezlie Octave Black, NP      . potassium chloride SA (K-DUR,KLOR-CON) CR tablet 40 mEq  40 mEq Oral BID Dron Tanna Furry, MD   40 mEq at 08/02/16 1059  . senna-docusate (Senokot-S) tablet 1 tablet  1 tablet Oral BID Radene Gunning, NP   1 tablet at 08/01/16 2154  . sodium chloride flush (NS) 0.9 % injection 10-40 mL  10-40 mL Intracatheter Q12H Dron Tanna Furry, MD   10 mL at 08/02/16 1233  . sodium chloride flush (NS) 0.9 % injection 10-40 mL  10-40 mL Intracatheter PRN Dron Tanna Furry, MD      .  sodium chloride flush (NS) 0.9 % injection 3 mL  3 mL Intravenous Q12H Radene Gunning, NP   3 mL at 08/01/16 2212  . sodium phosphate (FLEET) 7-19 GM/118ML enema 1 enema  1 enema Rectal Daily PRN Radene Gunning, NP      . vancomycin (VANCOCIN) IVPB 1000 mg/200 mL premix  1,000 mg Intravenous Q24H Otilio Miu, RPH   1,000 mg at 08/01/16 1820     Discharge Medications: Please see discharge  summary for a list of discharge medications.  Relevant Imaging Results:  Relevant Lab Results:   Additional Information SSN: [id number]  Jorge Ny, LCSW

## 2016-08-02 NOTE — Progress Notes (Signed)
Pharmacy Antibiotic Note [name] is a 83 y.o. female admitted on 07/30/2016 with pneumonia vs CHF exacerbation. Currently on day 4 of Cefepime and vancomycin for treatment.  Vancomycin trough that was drawn early in the course of therapy resulted as 9, which is below desired range of 15-20. SCr remains stable.  Plan: 1. Increase vancomycin to 1250 mg IV every 24 hours  2. Continue Cefepime 1 gram IV every 24 hours 3. Would deescalate abx as feasible with negative culture data   Height: 5\' 1"  (154.9 cm) Weight: 215 lb (97.5 kg) IBW/kg (Calculated) : 47.8  Temp (24hrs), Avg:98 F (36.7 C), Min:97.6 F (36.4 C), Max:98.9 F (37.2 C)   Recent Labs Lab 07/30/16 1459 07/30/16 1524 07/31/16 0623 08/01/16 0642 08/02/16 0400 08/02/16 0416 08/02/16 1800  WBC 5.5  --  3.3* 8.9 7.0  --   --   CREATININE 0.78  --  0.61 0.68  --  0.70  --   LATICACIDVEN  --  1.73  --   --   --   --   --   VANCOTROUGH  --   --   --   --   --   --  9*    Estimated Creatinine Clearance: 46 mL/min (by C-G formula based on SCr of 0.7 mg/dL).    Allergies  Allergen Reactions  . Morphine And Related Other (See Comments)    sick  . Statins Other (See Comments)    sick  . Zetia [Ezetimibe] Other (See Comments)    Side effect too strong   . Codeine Other (See Comments)    Antimicrobials this admission: 1/29 Vancomycin >> 1/29 Cefepime >>   Microbiology results: 1/29 blood x2 >> pending 1/29 MRSA PCR neg  Thank you for allowing pharmacy to be a part of this patient's care.  Vincenza Hews, PharmD, BCPS 08/02/2016, 6:52 PM

## 2016-08-02 NOTE — Progress Notes (Signed)
Progress Note  Patient Name: [name] Date of Encounter: 08/02/2016  Primary Cardiologist: Dr. Rayann Heman   Subjective   Feels better today, but still SOB at rest.  Denies chest pain.   Inpatient Medications    Scheduled Meds: . apixaban  5 mg Oral BID  . bisacodyl  5 mg Oral Daily  . ceFEPime (MAXIPIME) IV  1 g Intravenous Q24H  . diltiazem  360 mg Oral Daily  . ferrous sulfate  325 mg Oral Q breakfast  . furosemide  60 mg Intravenous Q6H  . insulin aspart  0-15 Units Subcutaneous TID WC  . insulin aspart  0-5 Units Subcutaneous QHS  . ipratropium-albuterol  3 mL Nebulization TID  . levothyroxine  125 mcg Oral QAC breakfast  . lisinopril  5 mg Oral Daily  . metoprolol tartrate  25 mg Oral BID  . potassium chloride (KCL MULTIRUN) 30 mEq in 265 mL IVPB  30 mEq Intravenous Once  . potassium chloride SA  40 mEq Oral BID  . senna-docusate  1 tablet Oral BID  . sodium chloride flush  10-40 mL Intracatheter Q12H  . sodium chloride flush  3 mL Intravenous Q12H  . vancomycin  1,000 mg Intravenous Q24H   Continuous Infusions:  PRN Meds: acetaminophen **OR** acetaminophen, albuterol, HYDROcodone-acetaminophen, ondansetron **OR** ondansetron (ZOFRAN) IV, sodium chloride flush, sodium phosphate   Vital Signs    Vitals:   08/02/16 0500 08/02/16 0740 08/02/16 0743 08/02/16 0825  BP:    (!) 113/56  Pulse:    91  Resp: 20     Temp:    98.9 F (37.2 C)  TempSrc:    Oral  SpO2:  90% 90% 95%  Weight:      Height:        Intake/Output Summary (Last 24 hours) at 08/02/16 0845 Last data filed at 08/02/16 0400  Gross per 24 hour  Intake              240 ml  Output             4175 ml  Net            -3935 ml   Filed Weights   07/30/16 1245 07/30/16 2344 08/02/16 0409  Weight: 220 lb (99.8 kg) 220 lb (99.8 kg) 215 lb (97.5 kg)    Telemetry    Afib, rate controlled.  - Personally Reviewed   Physical Exam   GEN: Elderly female in no acute distress.   Neck:Hard to  assess JVD due to body habitus.  Cardiac: Irregular rhythm, no murmurs, rubs, or gallops.  Respiratory: Rhonchi in upper lobes, bilateral lower rales.  GI: Soft, nontender, non-distended  MS: No edema; No deformity. Neuro:  Nonfocal  Psych: Normal affect   Labs    Chemistry Recent Labs Lab 07/30/16 1459 07/31/16 0623 08/01/16 0642 08/02/16 0416  NA 141 143 141 141  K 3.2* 3.6 3.9 3.3*  CL 102 105 102 93*  CO2 27 30 29  39*  GLUCOSE 196* 128* 117* 102*  BUN 11 11 18 15   CREATININE 0.78 0.61 0.68 0.70  CALCIUM 8.5* 8.7* 8.9 8.1*  PROT 6.2*  --   --   --   ALBUMIN 3.7  --  3.6 3.3*  AST 22  --   --   --   ALT 11*  --   --   --   ALKPHOS 47  --   --   --   BILITOT 0.5  --   --   --  GFRNONAA >60 >60 >60 >60  GFRAA >60 >60 >60 >60  ANIONGAP 12 8 10 9      Hematology Recent Labs Lab 07/31/16 0623 08/01/16 0642 08/02/16 0400  WBC 3.3* 8.9 7.0  RBC 4.32 4.76 4.65  HGB 13.0 14.3 13.7  HCT 41.1 46.6* 44.4  MCV 95.1 97.9 95.5  MCH 30.1 30.0 29.5  MCHC 31.6 30.7 30.9  RDW 15.3 15.7* 15.2  PLT 125* 130* 124*    BNP Recent Labs Lab 07/30/16 1512  BNP 184.2*     Radiology    Ct Chest Wo Contrast  Result Date: 07/31/2016 CLINICAL DATA:  Cough, RIGHT lung atelectasis EXAM: CT CHEST WITHOUT CONTRAST TECHNIQUE: Multidetector CT imaging of the chest was performed following the standard protocol without IV contrast. Sagittal and coronal MPR images reconstructed from axial data set. COMPARISON:  07/19/2015; chest radiograph 07/30/2016 FINDINGS: Cardiovascular: Atherosclerotic calcifications aorta, proximal great vessels and coronary arteries. Moderate pericardial effusion. Enlargement of cardiac chambers. Pulmonary vascular congestion and dilatation of central pulmonary arteries. Normal caliber thoracic aorta. Mediastinum/Nodes: Unremarkable esophagus. No thoracic adenopathy, with scattered normal sized mediastinal lymph nodes noted. Base of cervical region unremarkable.  Lungs/Pleura: Small BILATERAL pleural effusions larger on RIGHT. Peribronchial thickening. Scattered atelectasis in BILATERAL lower lobes. No acute infiltrate or pneumothorax. Upper Abdomen: Cirrhotic liver.  Otherwise unremarkable. Musculoskeletal: Scattered degenerative changes thoracic spine. Several bone islands lower thoracic spine stable. Diffuse osseous demineralization. IMPRESSION: Enlargement of cardiac chambers with moderate pericardial effusion. Suspect pulmonary arterial hypertension. Aortic atherosclerosis and coronary arterial calcifications. BILATERAL pleural effusions with scattered atelectasis in the lower lobes bilaterally. Cirrhotic liver. Electronically Signed   By: Lavonia Dana M.D.   On: 07/31/2016 17:59   Dg Chest Port 1 View  Result Date: 08/01/2016 CLINICAL DATA:  Right PICC placement.  Initial encounter. EXAM: PORTABLE CHEST 1 VIEW COMPARISON:  Chest radiograph performed earlier today at 9:27 a.m. FINDINGS: The patient's right PICC is noted ending about the mid SVC. A small right pleural effusion is noted, with persistent right basilar airspace opacification, which may reflect pneumonia or possibly asymmetric pulmonary edema. This is relatively similar in appearance to the prior study. No pneumothorax is seen. The cardiomediastinal silhouette remains enlarged. No acute osseous abnormalities are identified. A sclerotic focus at the left humeral neck likely reflects a bone island. IMPRESSION: 1. Right PICC noted ending about the mid SVC. 2. Small right pleural effusion, with persistent right basilar airspace opacification, which may reflect pneumonia or possibly asymmetric pulmonary edema. This is relatively similar in appearance to the prior study. 3. Cardiomegaly. Electronically Signed   By: Garald Balding M.D.   On: 08/01/2016 16:37   Dg Chest Port 1 View  Result Date: 08/01/2016 CLINICAL DATA:  Shortness of breath.  Wheezing. EXAM: PORTABLE CHEST 1 VIEW COMPARISON:  CT 07/31/2016.   Chest x-ray 07/30/2016 . FINDINGS: Cardiomegaly with pulmonary vascular prominence and bilateral interstitial prominence with with small bilateral pleural effusions. Low lung volumes. Bibasilar atelectasis. Bibasilar pneumonia cannot be excluded. No pneumothorax. IMPRESSION: 1. Cardiomegaly with pulmonary vascular prominence and bilateral interstitial prominence with bilateral small pleural effusions. Findings consistent with congestive heart failure . 2. Low lung volumes with bibasilar atelectasis. Bibasilar pneumonia cannot be excluded . Electronically Signed   By: Marcello Moores  Register   On: 08/01/2016 09:40    Cardiac Studies   Transthoracic Echocardiography 07/20/15 Study Conclusions  - Left ventricle: The cavity size was mildly dilated. Wall   thickness was increased in a pattern of mild LVH. Systolic  function was normal. The estimated ejection fraction was in the   range of 55% to 60%. Wall motion was normal; there were no   regional wall motion abnormalities. - Aortic valve: Valve mobility was restricted. There was moderate   stenosis. - Mitral valve: Calcified annulus. Mildly thickened leaflets .   There was moderate regurgitation. - Left atrium: The atrium was severely dilated. - Right atrium: The atrium was mildly dilated. - Pulmonary arteries: Systolic pressure was moderately increased.   PA peak pressure: 61 mm Hg (S). - Pericardium, extracardiac: A small pericardial effusion was   identified.  Impressions:  - Normal LV systolic function; biatrial enlargement; heavily   calcified aortic valve with moderate AS by mean gradient;   moderate MR; mild TR with moderately elevated pulmonary pressure;   small pericardial effusion.   Patient Profile     83 y.o. female  with a past medical history of permanent Afib (On Eliquis), HTN, chronic diastolic CHF, HLD, OSA, and DM. Also with history of cancer - bladder, breast and ovarian. She presented with acute on chronic diastolic  CHF and HCAP.   Assessment & Plan    1. Acute on chronic diastolic CHF: Remains volume overloaded on exam, but improved with IV diuresis. Her weight is down 5 pounds. Negative 4 L, still needs IV diuresis.   Has a PICC line now due to poor access, will transduce CVP to assess volume status. Creatinine stable.   2. Permanent atrial fibrillation: On 360mg  Cardizem. On 25mg  metoprolol BID for added rate control. BP stable.   3. OSA: On high flow nasal cannula, would benefit from Cpap at night.   Signed, Arbutus Leas, NP  08/02/2016, 8:45 AM     I have seen, examined and evaluated the patient this PM along with Jettie Booze, NP.  After reviewing all the available data and chart, we discussed the patients laboratory, study & physical findings as well as symptoms in detail. I agree with her findings, examination as well as impression recommendations as per our discussion.    Stable Afib - rate controlled. With BB & CCB  A on C DHF (HFPEF) -- still remains volume overloaded with significant edema and JVD. I agree with assessing CVP. I think she needs to be on at least another day or so of IV Lasix until we start seeing change in renal function. He feels somewhat better today, still has significant pedal edema.  May still benefit from CPAP at night, but would hopefully be able to wean oxygen as we diurese.   Glenetta Hew, M.D., M.S. Interventional Cardiologist   Pager # [phone] Phone # [phone] [address]. [place], [place] [address]

## 2016-08-02 NOTE — Progress Notes (Signed)
Patient's daughter: who is a Marine scientist. Casandra Doffing, phone: [phone]

## 2016-08-02 NOTE — Evaluation (Addendum)
Occupational Therapy Evaluation Patient Details Name: [name] MRN: [id number] DOB: [birth year] Today's Date: 08/02/2016    History of Present Illness patient is a 83 y/o female with hx of HF, A-fib, ovarian ca, bladder cancer, sleep apnea, DM, breast ca presents with acute on chronic diastolic CHF and HCAP   Clinical Impression   Pt reports she was able to dress herself independently PTA but did require assist with bathing. Currently pt able to perform sit to stand with max assist +2 in [place], [place] also used for transfers to [facility] and chair. Pt requires total assist for LB ADL and min assist for UB ADL in sitting. Pt presenting with generalized weakness, deconditioning, fatigue with activity impacting her independence and safety with ADL and functional mobility. VSS throughout on 6L supplemental O2. Recommending SNF for follow up to maximize independence and safety with ADL and functional mobility prior to return to ALF. Pt would benefit from continued skilled OT to address established goals.    Follow Up Recommendations  SNF;Supervision/Assistance - 24 hour    Equipment Recommendations  None recommended by OT    Recommendations for Other Services       Precautions / Restrictions Precautions Precautions: Fall Precaution Comments: watch 02 Restrictions Weight Bearing Restrictions: No      Mobility Bed Mobility               General bed mobility comments: Pt on BSC with PT upon arrival.  Transfers Overall transfer level: Needs assistance   Transfers: Sit to/from Stand;Stand Pivot Transfers Sit to Stand: Max assist;+2 physical assistance (with Stedy) Stand pivot transfers: Total assist (with Stedy)       General transfer comment: Pt able to grip front bar on Stedy to assist with pulling up. Tolerated static standing in Stedy during peri care.    Balance Overall balance assessment: Needs assistance Sitting-balance support: Feet supported Sitting  balance-Leahy Scale: Fair     Standing balance support: Bilateral upper extremity supported Standing balance-Leahy Scale: Poor                              ADL Overall ADL's : Needs assistance/impaired     Grooming: Set up;Supervision/safety;Sitting   Upper Body Bathing: Minimal assistance;Sitting   Lower Body Bathing: Total assistance;Sit to/from stand   Upper Body Dressing : Minimal assistance;Sitting   Lower Body Dressing: Total assistance;Sit to/from stand   Toilet Transfer: Total assistance;BSC (with use of Stedy)   Toileting- Clothing Manipulation and Hygiene: Total assistance;Sit to/from stand       Functional mobility during ADLs: Total assistance (with Stedy) General ADL Comments: Pt agreeable to post acute rehab. VSS throughout on 6L supplemental O2.     Vision     Perception     Praxis      Pertinent Vitals/Pain Pain Assessment: Faces Faces Pain Scale: No hurt     Hand Dominance     Extremity/Trunk Assessment Upper Extremity Assessment Upper Extremity Assessment: Generalized weakness   Lower Extremity Assessment Lower Extremity Assessment: Defer to PT evaluation   Cervical / Trunk Assessment Cervical / Trunk Assessment: Normal   Communication Communication Communication: No difficulties   Cognition Arousal/Alertness: Awake/alert Behavior During Therapy: WFL for tasks assessed/performed Overall Cognitive Status: Within Functional Limits for tasks assessed                     General Comments       Exercises  Shoulder Instructions      Home Living Family/patient expects to be discharged to:: Skilled nursing facility (from [facility])                                        Prior Functioning/Environment Level of Independence: Needs assistance  Gait / Transfers Assistance Needed: Ambulates with RW; walks around loop daily. ADL's / Homemaking Assistance Needed: Assist with showers, able to  dress self, meals provided by facility            OT Problem List: Decreased strength;Decreased activity tolerance;Impaired balance (sitting and/or standing);Decreased knowledge of use of DME or AE;Decreased knowledge of precautions;Obesity;Cardiopulmonary status limiting activity   OT Treatment/Interventions: Self-care/ADL training;Energy conservation;DME and/or AE instruction;Therapeutic exercise;Therapeutic activities;Patient/family education;Balance training    OT Goals(Current goals can be found in the care plan section) Acute Rehab OT Goals Patient Stated Goal: rehab to get better OT Goal Formulation: With patient Time For Goal Achievement: 08/16/16 Potential to Achieve Goals: Good ADL Goals Pt Will Perform Grooming: with min guard assist;standing Pt Will Perform Upper Body Dressing: with set-up;sitting Pt Will Perform Lower Body Dressing: with min guard assist;sit to/from stand Pt Will Transfer to Toilet: with min assist;ambulating;bedside commode (over toilet) Pt Will Perform Toileting - Clothing Manipulation and hygiene: with min guard assist;sit to/from stand  OT Frequency: Min 2X/week   Barriers to D/C:            Co-evaluation PT/OT/SLP Co-Evaluation/Treatment: Yes Reason for Co-Treatment: For patient/therapist safety;To address functional/ADL transfers   OT goals addressed during session: ADL's and self-care      End of Session Equipment Utilized During Treatment: Gait belt;Oxygen;Other (comment) Charlaine Dalton) Nurse Communication: Mobility status;Other (comment) (removed pad from bottom, pt had BM)  Activity Tolerance: Patient tolerated treatment well Patient left: in chair;with call bell/phone within reach;with chair alarm set   Time: [id number] OT Time Calculation (min): 15 min Charges:  OT General Charges $OT Visit: 1 Procedure OT Evaluation $OT Eval Moderate Complexity: 1 Procedure G-Codes:     Binnie Kand M.S., OTR/L Pager: [phone]  08/02/2016, 10:16  AM

## 2016-08-02 NOTE — Progress Notes (Signed)
PROGRESS NOTE    [name]  [id number] DOB: 1920/05/28 DOA: 07/30/2016 PCP: [name], MD   Brief Narrative: 83 y.o. female with medical history significant   diastolic heart failure, A. fib, obstructive sleep apnea on C Pap, 16 bowel obstructions in the last 5 years, ovarian cancer, bladder cancer, hypertension, hypothyroidism venous insufficiency presents to the emergency department from the facility with chief complaint sudden worsening shortness of breath and gradual worsening lower extremity edema. So evaluation yields acute respiratory failure hypoxia likely related to healthcare associated pneumonia in the setting of mild CHF exacerbation.  Assessment & Plan:  # Acute hypoxic respiratory failure likely in the setting of healthcare associated pneumonia and acute on chronic diastolic congestive heart failure: -CT chest with cardiomegaly and moderate pericardial effusion. Also has bilateral pleural effusion. -Patient is clinically better than yesterday. She's is still fluid overloaded and hypoxic requiring about 6 L of oxygen. Plan to continue IV Lasix 60 mg every 6 hourly, Foley catheter, strict in's and out and close monitoring of kidney function and respiratory status. -Also discussed with the cardiologist team were planning to check CVP to assess the volume status. -Continue vancomycin and cefepime for possible healthcare associated pneumonia. -Culture results are negative so far.  -Continue bronchodilators.  # Goals of care discussion: -I called and discussed with the patient's daughter Park Pope in detail and updated the current plan of care. She reported that she is the healthcare power of attorney for the patient and wants her mother to be complete DO NOT RESUSCITATE. She does not want any heroic measures.  #Healthcare associated pneumonia: Continue antibiotics. Follow culture. Influenza negative.  # Acute on chronic congestive heart failure with preserved  EF: Patient had an echocardiogram about 2 weeks ago which was consistent with EF of 55-60%. -Continue IV diuretics as above. Cardiology consult appreciated. -Mild elevation in troponin likely due to congestive heart failure. -Continue potassium chloride, lisinopril, metoprolol.  #Hypokalemia in the setting of diuretics: Repleted IV and oral potassium chloride. Monitor labs. Magnesium level acceptable.  #Atrial fibrillation, paroxysmal: Continue diltiazem, metoprolol and eliquis for systemic anticoagulation. Currently on telemetry. Continue to monitor closely.  #Lateral lower extremity edema: Probably exacerbated by CHF. On Lasix.  -Unna boot, wound care  #Diabetes: Continue sliding scale. Monitor blood sugar level.  #hypertension: Monitor blood pressure.  #History of bowel obstruction: Continue with the bowel regimen. Watch for bowel movement closely.  PT, OT evaluation.  Principal Problem:   Acute respiratory failure with hypoxia (HCC) Active Problems:   Chronic diastolic heart failure (HCC)   Atrial fibrillation (HCC)   Hypokalemia   Diabetes mellitus (HCC)   Hypothyroidism   Venous insufficiency   Hyperlipidemia   PNA (pneumonia)   Essential hypertension   Bowel obstruction   HCAP (healthcare-associated pneumonia)   Bilateral leg edema   Diabetes mellitus with complication (HCC)  DVT prophylaxis:Systemic anticoagulation  Code Status:Partial DO NOT RESUSCITATE.  Family Communication: No family present at bedside. I called patient's daughter and discussed in detail. Disposition Plan: Likely discharge to home versus SNF in 2-3 days. I will consult social work. Consultants:   Cardiologist  Procedures:None  Antimicrobials:Vancomycin and cefepime  Subjective:Patient was seen and examined at bedside. Feeling better than yesterday but is still tired and has shortness of breath. Denied headache, nausea vomiting, chest pain. Objective: Vitals:   08/02/16 0500 08/02/16 0740  08/02/16 0743 08/02/16 0825  BP:    (!) 113/56  Pulse:    91  Resp: 20  Temp:    98.9 F (37.2 C)  TempSrc:    Oral  SpO2:  90% 90% 95%  Weight:      Height:        Intake/Output Summary (Last 24 hours) at 08/02/16 1114 Last data filed at 08/02/16 0400  Gross per 24 hour  Intake              240 ml  Output             1775 ml  Net            -1535 ml   Filed Weights   07/30/16 1245 07/30/16 2344 08/02/16 0409  Weight: 99.8 kg (220 lb) 99.8 kg (220 lb) 97.5 kg (215 lb)    Examination:  General exam: Elderly female lying on bed, not in distress today  Respiratory system: Bibasal decreased breath sound with occasional expiratory wheeze, better than yesterday. Cardiovascular system: Irregular heart rate, S1-S2 normal.  Gastrointestinal system: Abdomen soft, nontender, nondistended. Bowel sound positive. Central nervous system: Alert awake, following commands.  Extremities:Lateral lower extremity with dressing on, edema+ Skin: No rashes, lesions or ulcers Psychiatry: Judgement and insight appear normal.   Foley catheter with clear urine.    Data Reviewed: I have personally reviewed following labs and imaging studies  CBC:  Recent Labs Lab 07/30/16 1459 07/31/16 0623 08/01/16 0642 08/02/16 0400  WBC 5.5 3.3* 8.9 7.0  NEUTROABS 5.0  --   --   --   HGB 13.8 13.0 14.3 13.7  HCT 43.4 41.1 46.6* 44.4  MCV 94.3 95.1 97.9 95.5  PLT 118* 125* 130* [id number]*   Basic Metabolic Panel:  Recent Labs Lab 07/30/16 1459 07/31/16 0623 08/01/16 0642 08/02/16 0400 08/02/16 0416  NA 141 143 141  --  141  K 3.2* 3.6 3.9  --  3.3*  CL 102 105 102  --  93*  CO2 27 30 29   --  39*  GLUCOSE 196* 128* 117*  --  102*  BUN 11 11 18   --  15  CREATININE 0.78 0.61 0.68  --  0.70  CALCIUM 8.5* 8.7* 8.9  --  8.1*  MG  --   --   --  1.8  --   PHOS  --   --  3.5  --  3.1   GFR: Estimated Creatinine Clearance: 46 mL/min (by C-G formula based on SCr of 0.7 mg/dL). Liver Function  Tests:  Recent Labs Lab 07/30/16 1459 08/01/16 0642 08/02/16 0416  AST 22  --   --   ALT 11*  --   --   ALKPHOS 47  --   --   BILITOT 0.5  --   --   PROT 6.2*  --   --   ALBUMIN 3.7 3.6 3.3*   No results for input(s): LIPASE, AMYLASE in the last 168 hours. No results for input(s): AMMONIA in the last 168 hours. Coagulation Profile: No results for input(s): INR, PROTIME in the last 168 hours. Cardiac Enzymes: No results for input(s): CKTOTAL, CKMB, CKMBINDEX, TROPONINI in the last 168 hours. BNP (last 3 results) No results for input(s): PROBNP in the last 8760 hours. HbA1C:  Recent Labs  07/31/16 0623  HGBA1C 5.6   CBG:  Recent Labs Lab 08/01/16 0809 08/01/16 1139 08/01/16 1623 08/01/16 2139 08/02/16 0749  GLUCAP 93 113* 117* 128* 133*   Lipid Profile: No results for input(s): CHOL, HDL, LDLCALC, TRIG, CHOLHDL, LDLDIRECT in the last 72 hours. Thyroid Function Tests:  No results for input(s): TSH, T4TOTAL, FREET4, T3FREE, THYROIDAB in the last 72 hours. Anemia Panel: No results for input(s): VITAMINB12, FOLATE, FERRITIN, TIBC, IRON, RETICCTPCT in the last 72 hours. Sepsis Labs:  Recent Labs Lab 07/30/16 1524  LATICACIDVEN 1.73    Recent Results (from the past 240 hour(s))  Blood Culture (routine x 2)     Status: None (Preliminary result)   Collection Time: 07/30/16  3:20 PM  Result Value Ref Range Status   Specimen Description BLOOD RIGHT ARM  Final   Special Requests BOTTLES DRAWN AEROBIC AND ANAEROBIC 5CC  Final   Culture NO GROWTH 2 DAYS  Final   Report Status PENDING  Incomplete  Blood Culture (routine x 2)     Status: None (Preliminary result)   Collection Time: 07/30/16  3:50 PM  Result Value Ref Range Status   Specimen Description BLOOD RIGHT HAND  Final   Special Requests BOTTLES DRAWN AEROBIC AND ANAEROBIC 5CC  Final   Culture NO GROWTH 2 DAYS  Final   Report Status PENDING  Incomplete  MRSA PCR Screening     Status: None   Collection Time:  07/31/16 12:06 AM  Result Value Ref Range Status   MRSA by PCR NEGATIVE NEGATIVE Final    Comment:        The GeneXpert MRSA Assay (FDA approved for NASAL specimens only), is one component of a comprehensive MRSA colonization surveillance program. It is not intended to diagnose MRSA infection nor to guide or monitor treatment for MRSA infections.          Radiology Studies: Ct Chest Wo Contrast  Result Date: 07/31/2016 CLINICAL DATA:  Cough, RIGHT lung atelectasis EXAM: CT CHEST WITHOUT CONTRAST TECHNIQUE: Multidetector CT imaging of the chest was performed following the standard protocol without IV contrast. Sagittal and coronal MPR images reconstructed from axial data set. COMPARISON:  07/19/2015; chest radiograph 07/30/2016 FINDINGS: Cardiovascular: Atherosclerotic calcifications aorta, proximal great vessels and coronary arteries. Moderate pericardial effusion. Enlargement of cardiac chambers. Pulmonary vascular congestion and dilatation of central pulmonary arteries. Normal caliber thoracic aorta. Mediastinum/Nodes: Unremarkable esophagus. No thoracic adenopathy, with scattered normal sized mediastinal lymph nodes noted. Base of cervical region unremarkable. Lungs/Pleura: Small BILATERAL pleural effusions larger on RIGHT. Peribronchial thickening. Scattered atelectasis in BILATERAL lower lobes. No acute infiltrate or pneumothorax. Upper Abdomen: Cirrhotic liver.  Otherwise unremarkable. Musculoskeletal: Scattered degenerative changes thoracic spine. Several bone islands lower thoracic spine stable. Diffuse osseous demineralization. IMPRESSION: Enlargement of cardiac chambers with moderate pericardial effusion. Suspect pulmonary arterial hypertension. Aortic atherosclerosis and coronary arterial calcifications. BILATERAL pleural effusions with scattered atelectasis in the lower lobes bilaterally. Cirrhotic liver. Electronically Signed   By: Lavonia Dana M.D.   On: 07/31/2016 17:59   Dg  Chest Port 1 View  Result Date: 08/01/2016 CLINICAL DATA:  Right PICC placement.  Initial encounter. EXAM: PORTABLE CHEST 1 VIEW COMPARISON:  Chest radiograph performed earlier today at 9:27 a.m. FINDINGS: The patient's right PICC is noted ending about the mid SVC. A small right pleural effusion is noted, with persistent right basilar airspace opacification, which may reflect pneumonia or possibly asymmetric pulmonary edema. This is relatively similar in appearance to the prior study. No pneumothorax is seen. The cardiomediastinal silhouette remains enlarged. No acute osseous abnormalities are identified. A sclerotic focus at the left humeral neck likely reflects a bone island. IMPRESSION: 1. Right PICC noted ending about the mid SVC. 2. Small right pleural effusion, with persistent right basilar airspace opacification, which may reflect pneumonia or possibly  asymmetric pulmonary edema. This is relatively similar in appearance to the prior study. 3. Cardiomegaly. Electronically Signed   By: Garald Balding M.D.   On: 08/01/2016 16:37   Dg Chest Port 1 View  Result Date: 08/01/2016 CLINICAL DATA:  Shortness of breath.  Wheezing. EXAM: PORTABLE CHEST 1 VIEW COMPARISON:  CT 07/31/2016.  Chest x-ray 07/30/2016 . FINDINGS: Cardiomegaly with pulmonary vascular prominence and bilateral interstitial prominence with with small bilateral pleural effusions. Low lung volumes. Bibasilar atelectasis. Bibasilar pneumonia cannot be excluded. No pneumothorax. IMPRESSION: 1. Cardiomegaly with pulmonary vascular prominence and bilateral interstitial prominence with bilateral small pleural effusions. Findings consistent with congestive heart failure . 2. Low lung volumes with bibasilar atelectasis. Bibasilar pneumonia cannot be excluded . Electronically Signed   By: Marcello Moores  Register   On: 08/01/2016 09:40        Scheduled Meds: . apixaban  5 mg Oral BID  . bisacodyl  5 mg Oral Daily  . ceFEPime (MAXIPIME) IV  1 g  Intravenous Q24H  . diltiazem  360 mg Oral Daily  . ferrous sulfate  325 mg Oral Q breakfast  . furosemide  60 mg Intravenous Q6H  . insulin aspart  0-15 Units Subcutaneous TID WC  . insulin aspart  0-5 Units Subcutaneous QHS  . ipratropium-albuterol  3 mL Nebulization TID  . levothyroxine  125 mcg Oral QAC breakfast  . lisinopril  5 mg Oral Daily  . metoprolol tartrate  25 mg Oral BID  . potassium chloride (KCL MULTIRUN) 30 mEq in 265 mL IVPB  30 mEq Intravenous Once  . potassium chloride SA  40 mEq Oral BID  . senna-docusate  1 tablet Oral BID  . sodium chloride flush  10-40 mL Intracatheter Q12H  . sodium chloride flush  3 mL Intravenous Q12H  . vancomycin  1,000 mg Intravenous Q24H   Continuous Infusions:   LOS: 2 days    Terrel Nesheiwat Tanna Furry, MD [facility] Pager [phone]  If 7PM-7AM, please contact night-coverage [email or web address] Password [email or web address] 08/02/2016, 11:14 AM

## 2016-08-03 DIAGNOSIS — L97909 Non-pressure chronic ulcer of unspecified part of unspecified lower leg with unspecified severity: Secondary | ICD-10-CM | POA: Insufficient documentation

## 2016-08-03 DIAGNOSIS — I83009 Varicose veins of unspecified lower extremity with ulcer of unspecified site: Secondary | ICD-10-CM | POA: Insufficient documentation

## 2016-08-03 LAB — RENAL FUNCTION PANEL
Albumin: 3.4 g/dL — ABNORMAL LOW (ref 3.5–5.0)
Anion gap: 9 (ref 5–15)
BUN: 18 mg/dL (ref 6–20)
CALCIUM: 8.5 mg/dL — AB (ref 8.9–10.3)
CHLORIDE: 94 mmol/L — AB (ref 101–111)
CO2: 38 mmol/L — AB (ref 22–32)
CREATININE: 0.71 mg/dL (ref 0.44–1.00)
GFR calc non Af Amer: >60 mL/min (ref >60)
Glucose, Bld: 108 mg/dL — ABNORMAL HIGH (ref 65–99)
Phosphorus: 3 mg/dL (ref 2.5–4.6)
Potassium: 4.3 mmol/L (ref 3.5–5.1)
SODIUM: 141 mmol/L (ref 135–145)

## 2016-08-03 LAB — GLUCOSE, CAPILLARY
GLUCOSE-CAPILLARY: 102 mg/dL — AB (ref 65–99)
GLUCOSE-CAPILLARY: 110 mg/dL — AB (ref 65–99)
Glucose-Capillary: 118 mg/dL — ABNORMAL HIGH (ref 65–99)
Glucose-Capillary: 91 mg/dL (ref 65–99)

## 2016-08-03 LAB — MAGNESIUM: Magnesium: 2 mg/dL (ref 1.7–2.4)

## 2016-08-03 MED ORDER — FUROSEMIDE 10 MG/ML IJ SOLN
60.0000 mg | Freq: Two times a day (BID) | INTRAMUSCULAR | Status: DC
  Administered 2016-08-03 – 2016-08-05 (×5): 60 mg via INTRAVENOUS
  Filled 2016-08-03 (×4): qty 6

## 2016-08-03 NOTE — Progress Notes (Signed)
Progress Note  Patient Name: [name] Date of Encounter: 08/03/2016  Primary Cardiologist: Dr. Rayann Heman    Patient Profile     83 y.o. female  with a past medical history of permanent Afib (On Eliquis), HTN, chronic diastolic CHF, HLD, OSA, and DM. Also with history of cancer - bladder, breast and ovarian. She presented with acute on chronic diastolic CHF and HCAP.    Subjective   Reports feeling better today. States her breathing is better and denies having any CP, palpitations, or dizziness.    Inpatient Medications    Scheduled Meds: . apixaban  5 mg Oral BID  . bisacodyl  5 mg Oral Daily  . ceFEPime (MAXIPIME) IV  1 g Intravenous Q24H  . diltiazem  360 mg Oral Daily  . ferrous sulfate  325 mg Oral Q breakfast  . furosemide  60 mg Intravenous Q6H  . insulin aspart  0-15 Units Subcutaneous TID WC  . insulin aspart  0-5 Units Subcutaneous QHS  . ipratropium-albuterol  3 mL Nebulization TID  . levothyroxine  125 mcg Oral QAC breakfast  . lisinopril  5 mg Oral Daily  . metoprolol tartrate  25 mg Oral BID  . potassium chloride SA  40 mEq Oral BID  . senna-docusate  1 tablet Oral BID  . sodium chloride flush  10-40 mL Intracatheter Q12H  . sodium chloride flush  3 mL Intravenous Q12H  . vancomycin  1,250 mg Intravenous Q24H   Continuous Infusions:  PRN Meds: acetaminophen **OR** acetaminophen, albuterol, HYDROcodone-acetaminophen, ondansetron **OR** ondansetron (ZOFRAN) IV, sodium chloride flush, sodium phosphate   Vital Signs    Vitals:   08/03/16 0817 08/03/16 0819 08/03/16 0824 08/03/16 0847  BP:  126/83 126/83   Pulse:   (!) 106   Resp:      Temp:    97.5 F (36.4 C)  TempSrc:    Axillary  SpO2: 100% 98%    Weight:      Height:        Intake/Output Summary (Last 24 hours) at 08/03/16 0949 Last data filed at 08/03/16 0516  Gross per 24 hour  Intake              180 ml  Output             1700 ml  Net            -1520 ml   Filed Weights   07/30/16 2344 08/02/16 0409 08/03/16 0500  Weight: 99.8 kg (220 lb) 97.5 kg (215 lb) 95.7 kg (210 lb 14.4 oz)    Telemetry    Afib, rate controlled.  - Personally Reviewed   Physical Exam   GEN: Elderly female in no acute distress.   Neck:Hard to assess JVD due to body habitus.  Cardiac: Irregular rhythm, no murmurs, rubs, or gallops. +Bilateral lower extremity edema.  Respiratory:  Decreased breath sounds at bases. No wheezes, rales, or rhonchi.  GI: Soft, nontender, non-distended  MS: No deformity. Neuro:  Nonfocal  Psych: Normal affect   Labs    Chemistry Recent Labs Lab 07/30/16 1459  08/01/16 0642 08/02/16 0416 08/03/16 0515  NA 141  < > 141 141 141  K 3.2*  < > 3.9 3.3* 4.3  CL 102  < > 102 93* 94*  CO2 27  < > 29 39* 38*  GLUCOSE 196*  < > 117* 102* 108*  BUN 11  < > 18 15 18   CREATININE 0.78  < >  0.68 0.70 0.71  CALCIUM 8.5*  < > 8.9 8.1* 8.5*  PROT 6.2*  --   --   --   --   ALBUMIN 3.7  --  3.6 3.3* 3.4*  AST 22  --   --   --   --   ALT 11*  --   --   --   --   ALKPHOS 47  --   --   --   --   BILITOT 0.5  --   --   --   --   GFRNONAA >60  < > >60 >60 >60  GFRAA >60  < > >60 >60 >60  ANIONGAP 12  < > 10 9 9   < > = values in this interval not displayed.   Hematology  Recent Labs Lab 07/31/16 0623 08/01/16 0642 08/02/16 0400  WBC 3.3* 8.9 7.0  RBC 4.32 4.76 4.65  HGB 13.0 14.3 13.7  HCT 41.1 46.6* 44.4  MCV 95.1 97.9 95.5  MCH 30.1 30.0 29.5  MCHC 31.6 30.7 30.9  RDW 15.3 15.7* 15.2  PLT 125* 130* 124*    BNP  Recent Labs Lab 07/30/16 1512  BNP 184.2*     Radiology    Dg Chest Port 1 View  Result Date: 08/01/2016 CLINICAL DATA:  Right PICC placement.  Initial encounter. EXAM: PORTABLE CHEST 1 VIEW COMPARISON:  Chest radiograph performed earlier today at 9:27 a.m. FINDINGS: The patient's right PICC is noted ending about the mid SVC. A small right pleural effusion is noted, with persistent right basilar airspace opacification, which  may reflect pneumonia or possibly asymmetric pulmonary edema. This is relatively similar in appearance to the prior study. No pneumothorax is seen. The cardiomediastinal silhouette remains enlarged. No acute osseous abnormalities are identified. A sclerotic focus at the left humeral neck likely reflects a bone island. IMPRESSION: 1. Right PICC noted ending about the mid SVC. 2. Small right pleural effusion, with persistent right basilar airspace opacification, which may reflect pneumonia or possibly asymmetric pulmonary edema. This is relatively similar in appearance to the prior study. 3. Cardiomegaly. Electronically Signed   By: Garald Balding M.D.   On: 08/01/2016 16:37    Cardiac Studies   Transthoracic Echocardiography 07/20/15 Study Conclusions  - Left ventricle: The cavity size was mildly dilated. Wall   thickness was increased in a pattern of mild LVH. Systolic   function was normal. The estimated ejection fraction was in the   range of 55% to 60%. Wall motion was normal; there were no   regional wall motion abnormalities. - Aortic valve: Valve mobility was restricted. There was moderate   stenosis. - Mitral valve: Calcified annulus. Mildly thickened leaflets .   There was moderate regurgitation. - Left atrium: The atrium was severely dilated. - Right atrium: The atrium was mildly dilated. - Pulmonary arteries: Systolic pressure was moderately increased.   PA peak pressure: 61 mm Hg (S). - Pericardium, extracardiac: A small pericardial effusion was   identified.  Impressions:  - Normal LV systolic function; biatrial enlargement; heavily   calcified aortic valve with moderate AS by mean gradient;   moderate MR; mild TR with moderately elevated pulmonary pressure;   small pericardial effusion.   Assessment & Plan    1. Acute on chronic diastolic CHF: Remains volume overloaded on exam, but improved with IV diuresis. Her weight is down 10 pounds and negative 6.2 L since  admission. Cr stable at 0.7 but BUN trending up.   -IV Lasix:  decrease dose to 60 mg BID  2. Permanent atrial fibrillation: CHA2DSVASc2 is 6. BP stable.  -Continue Cardizem 360 mg daily and Metoprolol tartrate 25 mg BID for rate control.  -Continue Eliquis 5 mg BID for anticoagulation.  3. HTN -Continue Cardizem 360 mg daily -Continue Metoprolol tartrate 25 mg BID f -Continue Lisinopril 5 mg daily  4. OSA: On high flow nasal cannula, would benefit from Cpap at night.   HAP treatment per primary team.   Signed, Shela Leff, MD  08/03/2016, 9:49 AM     I have seen, examined and evaluated the patient this PM along with Dr.Rathore.  After reviewing all the available data and chart, we discussed the patients laboratory, study & physical findings as well as symptoms in detail. I agree with her findings, examination as well as impression recommendations as per our discussion.    She looks much better today - agree with backing down to BID IV Lasix today & consider PO tomorrow PM. Continue current rate control meds & Eliquis.  Needs to be up & ambulating.  Will follow along over there weekend.   Glenetta Hew, M.D., M.S. Interventional Cardiologist   Pager # [phone] Phone # [phone] [address] [place], [place] [address]

## 2016-08-03 NOTE — Progress Notes (Signed)
Pt had a 2 sec pause on the monitor. Pt is otherwise stable. MD notified.  Ferdinand Lango, RN

## 2016-08-03 NOTE — Progress Notes (Signed)
PROGRESS NOTE    [name]  [id number] DOB: [birth year] DOA: 07/30/2016 PCP: [name], MD   Brief Narrative: 83 y.o. female with medical history significant   diastolic heart failure, A. fib, obstructive sleep apnea on C Pap, 16 bowel obstructions in the last 5 years, ovarian cancer, bladder cancer, hypertension, hypothyroidism venous insufficiency presents to the emergency department from the facility with chief complaint sudden worsening shortness of breath and gradual worsening lower extremity edema. So evaluation yields acute respiratory failure hypoxia likely related to healthcare associated pneumonia in the setting of mild CHF exacerbation.  Assessment & Plan:  # Acute hypoxic respiratory failure likely in the setting of healthcare associated pneumonia and acute on chronic diastolic congestive heart failure: -CT chest with cardiomegaly and moderate pericardial effusion. Also has bilateral pleural effusion. -Patient is responding well with IV diuretics. She is net negative by about 6 L. Reducing the dose of IV Lasix to twice a day. Continue Foley catheter, strict in's and out and monitoring of kidney function. -MRSA PCR screening negative therefore discontinue vancomycin and continue cefepime for possible healthcare associated pneumonia. -Culture results are negative so far.  -Continue bronchodilators.  #Healthcare associated pneumonia: Continue cefepime, off vancomycin Follow culture. Influenza negative.  # Acute on chronic congestive heart failure with preserved EF: Patient had an echocardiogram about 2 weeks ago which was consistent with EF of 55-60%. -Continue IV diuretics as above. Cardiology consult appreciated. -Mild elevation in troponin likely due to congestive heart failure. -Continue potassium chloride, lisinopril, metoprolol.  #Hypokalemia in the setting of diuretics: Potassium level improved. Continue to monitor.   #Atrial fibrillation, paroxysmal:  Continue diltiazem, metoprolol and eliquis for systemic anticoagulation. Currently on telemetry. Continue to monitor closely.  #Lateral lower extremity edema: Probably exacerbated by CHF. On Lasix.  -Unna boot, wound care  #Diabetes: Continue sliding scale. Monitor blood sugar level.  #hypertension: Monitor blood pressure.  #History of bowel obstruction: Continue with the bowel regimen. Watch for bowel movement closely.  PT, OT evaluation. Discharge to SNF when patient is medically stable. Social worker consulted.  Principal Problem:   Acute respiratory failure with hypoxia (HCC) Active Problems:   Chronic diastolic heart failure (HCC)   Atrial fibrillation (HCC)   Hypokalemia   Diabetes mellitus (HCC)   Hypothyroidism   Venous insufficiency   Hyperlipidemia   PNA (pneumonia)   Essential hypertension   Bowel obstruction   HCAP (healthcare-associated pneumonia)   Bilateral leg edema   Diabetes mellitus with complication (HCC)   Goals of care, counseling/discussion  DVT prophylaxis:Systemic anticoagulation  Code Status:Partial DO NOT RESUSCITATE.  Family Communication: Discussed with the patient's daughter at bedside at length today. Disposition Plan: Likely discharge to SNF in 2-3 days.   Consultants:   Cardiologist  Procedures:None  Antimicrobials:Vancomycin discontinued on 2/2 Continue cefepime.  Subjective:Patient was seen and examined at bedside. Reported feeling somewhat better but he still has weakness, mild shortness of breath. Denied chest pain, nausea, vomiting or abdominal pain. Daughter at bedside.  Objective: Vitals:   08/03/16 0817 08/03/16 0819 08/03/16 0824 08/03/16 0847  BP:  126/83 126/83   Pulse:   (!) 106   Resp:      Temp:    97.5 F (36.4 C)  TempSrc:    Axillary  SpO2: 100% 98%    Weight:      Height:        Intake/Output Summary (Last 24 hours) at 08/03/16 1305 Last data filed at 08/03/16 1048  Gross per 24 hour  Intake               180 ml  Output             2300 ml  Net            -2120 ml   Filed Weights   07/30/16 2344 08/02/16 0409 08/03/16 0500  Weight: 99.8 kg (220 lb) 97.5 kg (215 lb) 95.7 kg (210 lb 14.4 oz)    Examination:  General exam: Elderly female sitting on chair today, looks more alert and comfortable.  Respiratory system: Bibasal decreased breath sound, no wheezing. Cardiovascular system: Irregular heart rate, S1-S2 normal. Unchanged Gastrointestinal system: Abdomen soft, nontender, nondistended. Bowel sound positive. Central nervous system: Alert awake, following commands.  Extremities:Lateral lower extremity with dressing on, edema+ Skin: No rashes, lesions or ulcers Psychiatry: Judgement and insight appear normal.   Foley catheter with clear urine.    Data Reviewed: I have personally reviewed following labs and imaging studies  CBC:  Recent Labs Lab 07/30/16 1459 07/31/16 0623 08/01/16 0642 08/02/16 0400  WBC 5.5 3.3* 8.9 7.0  NEUTROABS 5.0  --   --   --   HGB 13.8 13.0 14.3 13.7  HCT 43.4 41.1 46.6* 44.4  MCV 94.3 95.1 97.9 95.5  PLT 118* 125* 130* [id number]*   Basic Metabolic Panel:  Recent Labs Lab 07/30/16 1459 07/31/16 0623 08/01/16 0642 08/02/16 0400 08/02/16 0416 08/03/16 0515  NA 141 143 141  --  141 141  K 3.2* 3.6 3.9  --  3.3* 4.3  CL 102 105 102  --  93* 94*  CO2 27 30 29   --  39* 38*  GLUCOSE 196* 128* 117*  --  102* 108*  BUN 11 11 18   --  15 18  CREATININE 0.78 0.61 0.68  --  0.70 0.71  CALCIUM 8.5* 8.7* 8.9  --  8.1* 8.5*  MG  --   --   --  1.8  --  2.0  PHOS  --   --  3.5  --  3.1 3.0   GFR: Estimated Creatinine Clearance: 45.5 mL/min (by C-G formula based on SCr of 0.71 mg/dL). Liver Function Tests:  Recent Labs Lab 07/30/16 1459 08/01/16 0642 08/02/16 0416 08/03/16 0515  AST 22  --   --   --   ALT 11*  --   --   --   ALKPHOS 47  --   --   --   BILITOT 0.5  --   --   --   PROT 6.2*  --   --   --   ALBUMIN 3.7 3.6 3.3* 3.4*   No  results for input(s): LIPASE, AMYLASE in the last 168 hours. No results for input(s): AMMONIA in the last 168 hours. Coagulation Profile: No results for input(s): INR, PROTIME in the last 168 hours. Cardiac Enzymes: No results for input(s): CKTOTAL, CKMB, CKMBINDEX, TROPONINI in the last 168 hours. BNP (last 3 results) No results for input(s): PROBNP in the last 8760 hours. HbA1C: No results for input(s): HGBA1C in the last 72 hours. CBG:  Recent Labs Lab 08/02/16 1116 08/02/16 1617 08/02/16 2035 08/03/16 0752 08/03/16 1133  GLUCAP 80 91 128* 102* 118*   Lipid Profile: No results for input(s): CHOL, HDL, LDLCALC, TRIG, CHOLHDL, LDLDIRECT in the last 72 hours. Thyroid Function Tests: No results for input(s): TSH, T4TOTAL, FREET4, T3FREE, THYROIDAB in the last 72 hours. Anemia Panel: No results for input(s): VITAMINB12, FOLATE, FERRITIN, TIBC, IRON, RETICCTPCT  in the last 72 hours. Sepsis Labs:  Recent Labs Lab 07/30/16 1524  LATICACIDVEN 1.73    Recent Results (from the past 240 hour(s))  Blood Culture (routine x 2)     Status: None (Preliminary result)   Collection Time: 07/30/16  3:20 PM  Result Value Ref Range Status   Specimen Description BLOOD RIGHT ARM  Final   Special Requests BOTTLES DRAWN AEROBIC AND ANAEROBIC 5CC  Final   Culture NO GROWTH 3 DAYS  Final   Report Status PENDING  Incomplete  Blood Culture (routine x 2)     Status: None (Preliminary result)   Collection Time: 07/30/16  3:50 PM  Result Value Ref Range Status   Specimen Description BLOOD RIGHT HAND  Final   Special Requests BOTTLES DRAWN AEROBIC AND ANAEROBIC 5CC  Final   Culture NO GROWTH 3 DAYS  Final   Report Status PENDING  Incomplete  MRSA PCR Screening     Status: None   Collection Time: 07/31/16 12:06 AM  Result Value Ref Range Status   MRSA by PCR NEGATIVE NEGATIVE Final    Comment:        The GeneXpert MRSA Assay (FDA approved for NASAL specimens only), is one component of  a comprehensive MRSA colonization surveillance program. It is not intended to diagnose MRSA infection nor to guide or monitor treatment for MRSA infections.          Radiology Studies: Dg Chest Port 1 View  Result Date: 08/01/2016 CLINICAL DATA:  Right PICC placement.  Initial encounter. EXAM: PORTABLE CHEST 1 VIEW COMPARISON:  Chest radiograph performed earlier today at 9:27 a.m. FINDINGS: The patient's right PICC is noted ending about the mid SVC. A small right pleural effusion is noted, with persistent right basilar airspace opacification, which may reflect pneumonia or possibly asymmetric pulmonary edema. This is relatively similar in appearance to the prior study. No pneumothorax is seen. The cardiomediastinal silhouette remains enlarged. No acute osseous abnormalities are identified. A sclerotic focus at the left humeral neck likely reflects a bone island. IMPRESSION: 1. Right PICC noted ending about the mid SVC. 2. Small right pleural effusion, with persistent right basilar airspace opacification, which may reflect pneumonia or possibly asymmetric pulmonary edema. This is relatively similar in appearance to the prior study. 3. Cardiomegaly. Electronically Signed   By: Garald Balding M.D.   On: 08/01/2016 16:37        Scheduled Meds: . apixaban  5 mg Oral BID  . bisacodyl  5 mg Oral Daily  . ceFEPime (MAXIPIME) IV  1 g Intravenous Q24H  . diltiazem  360 mg Oral Daily  . ferrous sulfate  325 mg Oral Q breakfast  . furosemide  60 mg Intravenous BID  . insulin aspart  0-15 Units Subcutaneous TID WC  . insulin aspart  0-5 Units Subcutaneous QHS  . ipratropium-albuterol  3 mL Nebulization TID  . levothyroxine  125 mcg Oral QAC breakfast  . lisinopril  5 mg Oral Daily  . metoprolol tartrate  25 mg Oral BID  . potassium chloride SA  40 mEq Oral BID  . senna-docusate  1 tablet Oral BID  . sodium chloride flush  10-40 mL Intracatheter Q12H  . sodium chloride flush  3 mL  Intravenous Q12H   Continuous Infusions:   LOS: 3 days    Vennesa Bastedo Tanna Furry, MD [facility] Pager [phone]  If 7PM-7AM, please contact night-coverage [email or web address] Password [email or web address] 08/03/2016, 1:05 PM

## 2016-08-03 NOTE — Clinical Social Work Note (Signed)
Clinical Social Work Assessment  Patient Details  Name: [name] MRN: [id number] Date of Birth: 07-Feb-1920  Date of referral:  08/03/16               Reason for consult:  Facility Placement                Permission sought to share information with:  Chartered certified accountant granted to share information::  Yes, Verbal Permission Granted  Name::     [name]  Agency::  SNF  Relationship::  dtr  Contact Information:     Housing/Transportation Living arrangements for the past 2 months:  [place] ([place]) Source of Information:  Patient Patient Interpreter Needed:  None Criminal Activity/Legal Involvement Pertinent to Current Situation/Hospitalization:  No - Comment as needed Significant Relationships:  Adult Children Lives with:  Facility Resident Do you feel safe going back to the place where you live?  Yes Need for family participation in patient care:  No (Coment)  Care giving concerns:  Pt feels good about her care at [facility] but [facility] unable to take back at [facility]   Facilities manager / plan:  CSW spoke with pt about PT recommendation for SNF and SNF referral process.   CSW spoke with [facility] who cannot take patient back as weak as she is.  Employment status:  Retired Forensic scientist:  Medicare PT Recommendations:  [place] / Referral to community resources:  [place]  Patient/Family's Response to care: Pt and pt dtr [name] agreeable to SNF placement- [name] would prefer [facility] which is close to home and pt has been before.  Patient/Family's Understanding of and Emotional Response to Diagnosis, Current Treatment, and Prognosis:  Pt dtr [name] is Therapist, sports and has good understanding of pt condition- hopeful pt will continue to improves  Emotional Assessment Appearance:  Appears younger than stated age Attitude/Demeanor/Rapport:     Affect (typically observed):  Pleasant, Appropriate Orientation:  Oriented to Self, Oriented to Place, Oriented to  Time, Oriented to Situation Alcohol / Substance use:  Not Applicable Psych involvement (Current and /or in the community):  No (Comment)  Discharge Needs  Concerns to be addressed:  Care Coordination Readmission within the last 30 days:  No Current discharge risk:  Physical Impairment Barriers to Discharge:  Continued Medical Work up   Jorge Ny, LCSW 08/03/2016, 12:11 PM

## 2016-08-04 ENCOUNTER — Inpatient Hospital Stay (HOSPITAL_COMMUNITY): Payer: Medicare Other

## 2016-08-04 DIAGNOSIS — I5031 Acute diastolic (congestive) heart failure: Secondary | ICD-10-CM

## 2016-08-04 DIAGNOSIS — J189 Pneumonia, unspecified organism: Secondary | ICD-10-CM

## 2016-08-04 DIAGNOSIS — I5033 Acute on chronic diastolic (congestive) heart failure: Secondary | ICD-10-CM

## 2016-08-04 DIAGNOSIS — L89152 Pressure ulcer of sacral region, stage 2: Secondary | ICD-10-CM

## 2016-08-04 LAB — RENAL FUNCTION PANEL
Albumin: 3.3 g/dL — ABNORMAL LOW (ref 3.5–5.0)
Anion gap: 6 (ref 5–15)
BUN: 17 mg/dL (ref 6–20)
CALCIUM: 8.7 mg/dL — AB (ref 8.9–10.3)
CHLORIDE: 96 mmol/L — AB (ref 101–111)
CO2: 38 mmol/L — ABNORMAL HIGH (ref 22–32)
CREATININE: 0.77 mg/dL (ref 0.44–1.00)
GFR calc Af Amer: >60 mL/min (ref >60)
GFR calc non Af Amer: >60 mL/min (ref >60)
Glucose, Bld: 102 mg/dL — ABNORMAL HIGH (ref 65–99)
Phosphorus: 3 mg/dL (ref 2.5–4.6)
Potassium: 4.8 mmol/L (ref 3.5–5.1)
SODIUM: 140 mmol/L (ref 135–145)

## 2016-08-04 LAB — CULTURE, BLOOD (ROUTINE X 2)
CULTURE: NO GROWTH
Culture: NO GROWTH

## 2016-08-04 LAB — GLUCOSE, CAPILLARY
GLUCOSE-CAPILLARY: 96 mg/dL (ref 65–99)
Glucose-Capillary: 107 mg/dL — ABNORMAL HIGH (ref 65–99)

## 2016-08-04 MED ORDER — POTASSIUM CHLORIDE CRYS ER 20 MEQ PO TBCR: 20.0000 meq | EXTENDED_RELEASE_TABLET | Freq: Every day | ORAL | Status: DC

## 2016-08-04 NOTE — Progress Notes (Signed)
PROGRESS NOTE    [name]  [id number] DOB: Jan 09, 1920 DOA: 07/30/2016 PCP: [name], MD   Brief Narrative: 83 y.o. female with medical history significant   diastolic heart failure, A. fib, obstructive sleep apnea on C Pap, 16 bowel obstructions in the last 5 years, ovarian cancer, bladder cancer, hypertension, hypothyroidism venous insufficiency presents to the emergency department from the facility with chief complaint sudden worsening shortness of breath and gradual worsening lower extremity edema. So evaluation yields acute respiratory failure hypoxia likely related to healthcare associated pneumonia in the setting of mild CHF exacerbation.  Assessment & Plan:  # Acute hypoxic respiratory failure likely in the setting of healthcare associated pneumonia and acute on chronic diastolic congestive heart failure: -CT chest with cardiomegaly and moderate pericardial effusion. Also has bilateral pleural effusion. -Patient is responding well with IV diuretics. She is net negative by about 7.2 L. I'll continue IV Lasix today. Repeat chest x-ray today. -Continue Foley catheter, strict in's and out and monitoring of kidney function. -MRSA PCR screening negative therefore discontinue vancomycin and continue cefepime for possible healthcare associated pneumonia. -Culture results are negative so far.  -Continue bronchodilators.  #Healthcare associated pneumonia: Continue cefepime, off vancomycin Follow culture. Influenza negative.  # Acute on chronic congestive heart failure with preserved EF: Patient had an echocardiogram about 2 weeks ago which was consistent with EF of 55-60%. -Continue IV diuretics as above. Cardiology consult appreciated. -Mild elevation in troponin likely due to congestive heart failure. -Continue potassium chloride, lisinopril, metoprolol.  #Hypokalemia in the setting of diuretics: Potassium level improved. Serum potassium level 4.8 today. Discontinue  potassium chloride oral supplement. Continue to monitor.   #Atrial fibrillation, paroxysmal: Continue diltiazem, metoprolol and eliquis for systemic anticoagulation. Currently on telemetry. Continue to monitor closely.  #Lateral lower extremity edema: Probably exacerbated by CHF. On Lasix.  -Unna boot, wound care  #Diabetes: Continue sliding scale. Monitor blood sugar level.  #hypertension: Monitor blood pressure.  #History of bowel obstruction: Continue with the bowel regimen. Watch for bowel movement closely.  #Pressure injury of the skin around sacrum, unspecified: I will consult wound care team.  #Goals of care discussion: I discussed advanced directive and CODE STATUS with the patient at bedside. She verified and agreed to DO NOT RESUSCITATE status.  PT, OT evaluation. Discharge to SNF when patient is medically stable. Social worker consulted.  Principal Problem:   Acute respiratory failure with hypoxia (HCC) Active Problems:   Chronic diastolic heart failure (HCC)   Atrial fibrillation (HCC)   Hypokalemia   Diabetes mellitus (HCC)   Hypothyroidism   Venous insufficiency   Hyperlipidemia   PNA (pneumonia)   Essential hypertension   Bowel obstruction   HCAP (healthcare-associated pneumonia)   Bilateral leg edema   Diabetes mellitus with complication (HCC)   Goals of care, counseling/discussion   Pressure injury of skin  DVT prophylaxis:Systemic anticoagulation  Code Status:Partial DO NOT RESUSCITATE.  Family Communication: No family present at bedside today. I discussed with patient's daughter yesterday. Disposition Plan: Likely discharge to SNF in 2-3 days. Social worker evaluation ongoing.   Consultants:   Cardiologist  Procedures:None  Antimicrobials:Vancomycin discontinued on 2/2 Continue cefepime.  Subjective:Patient was seen and examined at bedside. Sitting on chair today. Still has shortness of breath and dry cough. Denied nausea vomiting chest pain,  nausea or vomiting. Reported discomfort around the sacral area.   Objective: Vitals:   08/04/16 0500 08/04/16 0733 08/04/16 0905 08/04/16 0906  BP: 125/80  (!) 135/92   Pulse:  88   (!) 105  Resp: 13     Temp: 98.1 F (36.7 C)     TempSrc: Oral     SpO2: 99% 94%    Weight: 95.6 kg (210 lb 11.2 oz)     Height:        Intake/Output Summary (Last 24 hours) at 08/04/16 1053 Last data filed at 08/04/16 1040  Gross per 24 hour  Intake              500 ml  Output              901 ml  Net             -401 ml   Filed Weights   08/02/16 0409 08/03/16 0500 08/04/16 0500  Weight: 97.5 kg (215 lb) 95.7 kg (210 lb 14.4 oz) 95.6 kg (210 lb 11.2 oz)    Examination:  General exam: Elderly female sitting on chair, looks more comfortable.  Respiratory system: Bibasal decreased breath sound, no wheezing Cardiovascular system: Irregular heart rate, S1-S2 normal. Unchanged Gastrointestinal system: Abdomen soft, nontender. Bowel sound positive. Central nervous system: Alert awake, following commands.  Extremities:Lateral lower extremity with dressing on, edema+ Skin: No rashes, lesions or ulcers Psychiatry: Judgement and insight appear normal..   Foley catheter with clear urine.    Data Reviewed: I have personally reviewed following labs and imaging studies  CBC:  Recent Labs Lab 07/30/16 1459 07/31/16 0623 08/01/16 0642 08/02/16 0400  WBC 5.5 3.3* 8.9 7.0  NEUTROABS 5.0  --   --   --   HGB 13.8 13.0 14.3 13.7  HCT 43.4 41.1 46.6* 44.4  MCV 94.3 95.1 97.9 95.5  PLT 118* 125* 130* [id number]*   Basic Metabolic Panel:  Recent Labs Lab 07/31/16 0623 08/01/16 0642 08/02/16 0400 08/02/16 0416 08/03/16 0515 08/04/16 0420  NA 143 141  --  141 141 140  K 3.6 3.9  --  3.3* 4.3 4.8  CL 105 102  --  93* 94* 96*  CO2 30 29  --  39* 38* 38*  GLUCOSE 128* 117*  --  102* 108* 102*  BUN 11 18  --  15 18 17   CREATININE 0.61 0.68  --  0.70 0.71 0.77  CALCIUM 8.7* 8.9  --  8.1* 8.5* 8.7*    MG  --   --  1.8  --  2.0  --   PHOS  --  3.5  --  3.1 3.0 3.0   GFR: Estimated Creatinine Clearance: 45.4 mL/min (by C-G formula based on SCr of 0.77 mg/dL). Liver Function Tests:  Recent Labs Lab 07/30/16 1459 08/01/16 [id number] 08/02/16 0416 08/03/16 0515 08/04/16 0420  AST 22  --   --   --   --   ALT 11*  --   --   --   --   ALKPHOS 47  --   --   --   --   BILITOT 0.5  --   --   --   --   PROT 6.2*  --   --   --   --   ALBUMIN 3.7 3.6 3.3* 3.4* 3.3*   No results for input(s): LIPASE, AMYLASE in the last 168 hours. No results for input(s): AMMONIA in the last 168 hours. Coagulation Profile: No results for input(s): INR, PROTIME in the last 168 hours. Cardiac Enzymes: No results for input(s): CKTOTAL, CKMB, CKMBINDEX, TROPONINI in the last 168 hours. BNP (last 3 results) No results for  input(s): PROBNP in the last 8760 hours. HbA1C: No results for input(s): HGBA1C in the last 72 hours. CBG:  Recent Labs Lab 08/02/16 2035 08/03/16 0752 08/03/16 1133 08/03/16 1636 08/03/16 2151  GLUCAP 128* 102* 118* 91 110*   Lipid Profile: No results for input(s): CHOL, HDL, LDLCALC, TRIG, CHOLHDL, LDLDIRECT in the last 72 hours. Thyroid Function Tests: No results for input(s): TSH, T4TOTAL, FREET4, T3FREE, THYROIDAB in the last 72 hours. Anemia Panel: No results for input(s): VITAMINB12, FOLATE, FERRITIN, TIBC, IRON, RETICCTPCT in the last 72 hours. Sepsis Labs:  Recent Labs Lab 07/30/16 1524  LATICACIDVEN 1.73    Recent Results (from the past 240 hour(s))  Blood Culture (routine x 2)     Status: None (Preliminary result)   Collection Time: 07/30/16  3:20 PM  Result Value Ref Range Status   Specimen Description BLOOD RIGHT ARM  Final   Special Requests BOTTLES DRAWN AEROBIC AND ANAEROBIC 5CC  Final   Culture NO GROWTH 4 DAYS  Final   Report Status PENDING  Incomplete  Blood Culture (routine x 2)     Status: None (Preliminary result)   Collection Time: 07/30/16  3:50 PM   Result Value Ref Range Status   Specimen Description BLOOD RIGHT HAND  Final   Special Requests BOTTLES DRAWN AEROBIC AND ANAEROBIC 5CC  Final   Culture NO GROWTH 4 DAYS  Final   Report Status PENDING  Incomplete  MRSA PCR Screening     Status: None   Collection Time: 07/31/16 12:06 AM  Result Value Ref Range Status   MRSA by PCR NEGATIVE NEGATIVE Final    Comment:        The GeneXpert MRSA Assay (FDA approved for NASAL specimens only), is one component of a comprehensive MRSA colonization surveillance program. It is not intended to diagnose MRSA infection nor to guide or monitor treatment for MRSA infections.          Radiology Studies: No results found.      Scheduled Meds: . apixaban  5 mg Oral BID  . bisacodyl  5 mg Oral Daily  . ceFEPime (MAXIPIME) IV  1 g Intravenous Q24H  . diltiazem  360 mg Oral Daily  . ferrous sulfate  325 mg Oral Q breakfast  . furosemide  60 mg Intravenous BID  . insulin aspart  0-15 Units Subcutaneous TID WC  . insulin aspart  0-5 Units Subcutaneous QHS  . ipratropium-albuterol  3 mL Nebulization TID  . levothyroxine  125 mcg Oral QAC breakfast  . lisinopril  5 mg Oral Daily  . metoprolol tartrate  25 mg Oral BID  . potassium chloride SA  40 mEq Oral BID  . senna-docusate  1 tablet Oral BID  . sodium chloride flush  10-40 mL Intracatheter Q12H  . sodium chloride flush  3 mL Intravenous Q12H   Continuous Infusions:   LOS: 4 days    Dron Tanna Furry, MD [facility] Pager [phone]  If 7PM-7AM, please contact night-coverage [email or web address] Password [email or web address] 08/04/2016, 10:53 AM

## 2016-08-04 NOTE — Progress Notes (Signed)
Orthopedic Tech Progress Note Patient Details:  [name] 1919/11/14 [id number]  Ortho Devices Type of Ortho Device: Louretta Parma boot Ortho Device/Splint Location: (B) LE Ortho Device/Splint Interventions: Ordered, Application   Braulio Bosch 08/04/2016, 9:00 PM

## 2016-08-04 NOTE — Consult Note (Signed)
[facility] Nurse wound consult note Reason for Consult: intertriginous dermatitis secondary to moisture in the gluteal cleft, sub pannicular and right inguinal areas. Suspect fungal overgrowth. Patient also noted to be wearing bilateral compression wraps (Unna's Boots).  Will order for routine ortho tech changes. Wound type:Moisture associated skin damage, specifically intertriginous dermatitis. Venous stasis with no wounds, edema only. Clinical CEAP 3. Pressure Injury POA: No Measurement:N/A Wound bed:N/A Drainage (amount, consistency, odor) Scant serous in the right inguinal and intragluteal skin folds. Periwound:Intact, dry Dressing procedure/placement/frequency: Patient is on a therapeutic mattress replacement with low air loss feature. Patient with intertriginous dermatitis with suspected fungal overgrowth in the areas identified above (right inguinal, gluteal cleft and sub pannicular skin folds), no pressure injuries. If you agree, please order one dose of oral antifungal, eg., Diflucan. Nursing has been provided with guidance via the orders for use of our house antimicrobial textile for wicking away of moisture in the skin folds and routine care (twice weekly) of existing Unna's Boots for venous insufficiency without ulceration. [facility] nursing team will not follow, but will remain available to this patient, the nursing and medical teams.  Please re-consult if needed. Thanks, Maudie Flakes, MSN, RN, [place], [facility]  Pager# [phone]

## 2016-08-04 NOTE — Progress Notes (Signed)
Progress Note  Patient Name: [name] Date of Encounter: 08/04/2016  Primary Cardiologist: Allred  Subjective   Mostly complains of a sore backside  (Stage 2 pressure ulcer). Net 1 L diuresis in last 24 hours, but weight not really changed. Still orthopneic. Atrial fibrillation appears generally well rate controlled without significant pauses (max 2 seconds).  Inpatient Medications    Scheduled Meds: . apixaban  5 mg Oral BID  . bisacodyl  5 mg Oral Daily  . ceFEPime (MAXIPIME) IV  1 g Intravenous Q24H  . diltiazem  360 mg Oral Daily  . ferrous sulfate  325 mg Oral Q breakfast  . furosemide  60 mg Intravenous BID  . insulin aspart  0-15 Units Subcutaneous TID WC  . insulin aspart  0-5 Units Subcutaneous QHS  . ipratropium-albuterol  3 mL Nebulization TID  . levothyroxine  125 mcg Oral QAC breakfast  . lisinopril  5 mg Oral Daily  . metoprolol tartrate  25 mg Oral BID  . potassium chloride SA  40 mEq Oral BID  . senna-docusate  1 tablet Oral BID  . sodium chloride flush  10-40 mL Intracatheter Q12H  . sodium chloride flush  3 mL Intravenous Q12H   Continuous Infusions:  PRN Meds: acetaminophen **OR** acetaminophen, albuterol, HYDROcodone-acetaminophen, ondansetron **OR** ondansetron (ZOFRAN) IV, sodium chloride flush, sodium phosphate   Vital Signs    Vitals:   08/04/16 0500 08/04/16 0733 08/04/16 0905 08/04/16 0906  BP: 125/80  (!) 135/92   Pulse: 88   (!) 105  Resp: 13     Temp: 98.1 F (36.7 C)     TempSrc: Oral     SpO2: 99% 94%    Weight: 95.6 kg (210 lb 11.2 oz)     Height:        Intake/Output Summary (Last 24 hours) at 08/04/16 1040 Last data filed at 08/04/16 0933  Gross per 24 hour  Intake              500 ml  Output             1500 ml  Net            -1000 ml   Filed Weights   08/02/16 0409 08/03/16 0500 08/04/16 0500  Weight: 97.5 kg (215 lb) 95.7 kg (210 lb 14.4 oz) 95.6 kg (210 lb 11.2 oz)    Telemetry    AFib rate 70-80s at  night, 90-100 during day - Personally Reviewed  ECG    atrial fibrillation, minor T wave changes - Personally Reviewed  Physical Exam  Smiling, comfortable GEN: No acute distress.   Neck: JVP 6-7 cm Cardiac: irregular, no murmurs, rubs, or gallops.  Respiratory: Clear to auscultation bilaterally. GI: Soft, nontender, non-distended  MS: 1-2+ calf edema; No deformity. Neuro:  Nonfocal  Psych: Normal affect   Labs    Chemistry Recent Labs Lab 07/30/16 1459  08/02/16 0416 08/03/16 0515 08/04/16 0420  NA 141  < > 141 141 140  K 3.2*  < > 3.3* 4.3 4.8  CL 102  < > 93* 94* 96*  CO2 27  < > 39* 38* 38*  GLUCOSE 196*  < > 102* 108* 102*  BUN 11  < > 15 18 17   CREATININE 0.78  < > 0.70 0.71 0.77  CALCIUM 8.5*  < > 8.1* 8.5* 8.7*  PROT 6.2*  --   --   --   --   ALBUMIN 3.7  < >  3.3* 3.4* 3.3*  AST 22  --   --   --   --   ALT 11*  --   --   --   --   ALKPHOS 47  --   --   --   --   BILITOT 0.5  --   --   --   --   GFRNONAA >60  < > >60 >60 >60  GFRAA >60  < > >60 >60 >60  ANIONGAP 12  < > 9 9 6   < > = values in this interval not displayed.   Hematology Recent Labs Lab 07/31/16 [id number] 08/01/16 0642 08/02/16 0400  WBC 3.3* 8.9 7.0  RBC 4.32 4.76 4.65  HGB 13.0 14.3 13.7  HCT 41.1 46.6* 44.4  MCV 95.1 97.9 95.5  MCH 30.1 30.0 29.5  MCHC 31.6 30.7 30.9  RDW 15.3 15.7* 15.2  PLT 125* 130* 124*    Cardiac EnzymesNo results for input(s): TROPONINI in the last 168 hours. No results for input(s): TROPIPOC in the last 168 hours.   BNP Recent Labs Lab 07/30/16 1512  BNP 184.2*     DDimer No results for input(s): DDIMER in the last 168 hours.   Radiology    No results found.  Cardiac Studies   07/20/2015 Echo - Left ventricle: The cavity size was mildly dilated. Wall   thickness was increased in a pattern of mild LVH. Systolic   function was normal. The estimated ejection fraction was in the   range of 55% to 60%. Wall motion was normal; there were no    regional wall motion abnormalities. - Aortic valve: Valve mobility was restricted. There was moderate   stenosis. - Mitral valve: Calcified annulus. Mildly thickened leaflets .   There was moderate regurgitation. - Left atrium: The atrium was severely dilated. - Right atrium: The atrium was mildly dilated. - Pulmonary arteries: Systolic pressure was moderately increased.   PA peak pressure: 61 mm Hg (S). - Pericardium, extracardiac: A small pericardial effusion was   identified.  Impressions:  - Normal LV systolic function; biatrial enlargement; heavily   calcified aortic valve with moderate AS by mean gradient;   moderate MR; mild TR with moderately elevated pulmonary pressure;   small pericardial effusion.   Patient Profile     83 y.o. female with permanent atrial fibrillation, chronic diastolic heart failure, obstructive sleep apnea and moderate pulmonary hypertension, diabetes mellitus, presenting with acute exacerbation of chronic heart failure in the setting of health care acquired pneumonia  Assessment & Plan    1. CHF: She is still hypervolemic and has orthopnea. I think she needs to continue intravenous diuretics. Actually increase the dose today. 2. AFib: Fair rate control on current medications. On appropriate anticoagulation. 3. HTN: Well controlled 4. OSA: Recommend CPAP at night 5. Pressure ulcer: Procedures this hospitalization, but will likely worsen unless she is mobilized more frequently. He to start getting her up.  Signed, Sanda Klein, MD  08/04/2016, 10:40 AM

## 2016-08-05 ENCOUNTER — Inpatient Hospital Stay (HOSPITAL_COMMUNITY): Payer: Medicare Other

## 2016-08-05 LAB — GLUCOSE, CAPILLARY
GLUCOSE-CAPILLARY: 136 mg/dL — AB (ref 65–99)
GLUCOSE-CAPILLARY: 96 mg/dL (ref 65–99)
Glucose-Capillary: 105 mg/dL — ABNORMAL HIGH (ref 65–99)
Glucose-Capillary: 104 mg/dL — ABNORMAL HIGH (ref 65–99)

## 2016-08-05 LAB — RENAL FUNCTION PANEL
ANION GAP: 12 (ref 5–15)
Albumin: 3.5 g/dL (ref 3.5–5.0)
BUN: 25 mg/dL — ABNORMAL HIGH (ref 6–20)
CHLORIDE: 92 mmol/L — AB (ref 101–111)
CO2: 35 mmol/L — AB (ref 22–32)
CREATININE: 0.8 mg/dL (ref 0.44–1.00)
Calcium: 9.2 mg/dL (ref 8.9–10.3)
Glucose, Bld: 111 mg/dL — ABNORMAL HIGH (ref 65–99)
POTASSIUM: 4.6 mmol/L (ref 3.5–5.1)
Phosphorus: 3.1 mg/dL (ref 2.5–4.6)
Sodium: 139 mmol/L (ref 135–145)

## 2016-08-05 LAB — CBC
HCT: 41.2 % (ref 36.0–46.0)
Hemoglobin: 13.1 g/dL (ref 12.0–15.0)
MCH: 29.8 pg (ref 26.0–34.0)
MCHC: 31.8 g/dL (ref 30.0–36.0)
MCV: 93.6 fL (ref 78.0–100.0)
PLATELETS: 131 10*3/uL — AB (ref 150–400)
RBC: 4.4 MIL/uL (ref 3.87–5.11)
RDW: 14.7 % (ref 11.5–15.5)
WBC: 7.7 10*3/uL (ref 4.0–10.5)

## 2016-08-05 LAB — MAGNESIUM: MAGNESIUM: 2.2 mg/dL (ref 1.7–2.4)

## 2016-08-05 MED ORDER — SODIUM CHLORIDE 0.9% FLUSH
10.0000 mL | Freq: Two times a day (BID) | INTRAVENOUS | Status: DC
  Administered 2016-08-05 – 2016-08-07 (×6): 10 mL

## 2016-08-05 MED ORDER — FLUCONAZOLE 150 MG PO TABS
150.0000 mg | ORAL_TABLET | Freq: Once | ORAL | Status: AC
  Administered 2016-08-05: 150 mg via ORAL
  Filled 2016-08-05 (×2): qty 1

## 2016-08-05 MED ORDER — APIXABAN 5 MG PO TABS
5.0000 mg | ORAL_TABLET | Freq: Two times a day (BID) | ORAL | Status: DC
  Administered 2016-08-06 – 2016-08-08 (×5): 5 mg via ORAL
  Filled 2016-08-05 (×5): qty 1

## 2016-08-05 MED ORDER — SODIUM CHLORIDE 0.9% FLUSH: 10.0000 mL | INTRAVENOUS | Status: DC | PRN

## 2016-08-05 MED ORDER — METOPROLOL TARTRATE 50 MG PO TABS
50.0000 mg | ORAL_TABLET | Freq: Two times a day (BID) | ORAL | Status: DC
  Administered 2016-08-05 – 2016-08-07 (×4): 50 mg via ORAL
  Filled 2016-08-05 (×4): qty 1

## 2016-08-05 NOTE — Progress Notes (Signed)
Pharmacy Antibiotic Note [name] is a 83 y.o. female admitted on 07/30/2016 with pneumonia vs CHF exacerbation. Currently on day 7 of Cefepime.  Vancomycin stopped on 08/03/16 after 4 days of therapy.  Afebrile, WBC 7.7. CXR noted improving, and may change IV to PO Lasix tomorrow.  Plan:  Continue Cefepime 1 gram IV q24hrs.  Will follow up for length of therapy.  Height: 5\' 1"  (154.9 cm) Weight: 210 lb 11.2 oz (95.6 kg) IBW/kg (Calculated) : 47.8  Temp (24hrs), Avg:97.5 F (36.4 C), Min:97.3 F (36.3 C), Max:97.6 F (36.4 C)   Recent Labs Lab 07/30/16 1459 07/30/16 1524 07/31/16 0623 08/01/16 0642 08/02/16 0400 08/02/16 0416 08/02/16 1800 08/03/16 0515 08/04/16 0420 08/05/16 0500 08/05/16 1130  WBC 5.5  --  3.3* 8.9 7.0  --   --   --   --   --  7.7  CREATININE 0.78  --  0.61 0.68  --  0.70  --  0.71 0.77 0.80  --   LATICACIDVEN  --  1.73  --   --   --   --   --   --   --   --   --   VANCOTROUGH  --   --   --   --   --   --  9*  --   --   --   --     Estimated Creatinine Clearance: 45.4 mL/min (by C-G formula based on SCr of 0.8 mg/dL).    Allergies  Allergen Reactions  . Morphine And Related Other (See Comments)    sick  . Statins Other (See Comments)    sick  . Zetia [Ezetimibe] Other (See Comments)    Side effect too strong   . Codeine Other (See Comments)    Antimicrobials this admission:  Vancomycin 1/29 >> 2/2  Cefepime 1/29 >>  Dose changes:   2/1: Vancomycin trough = 9 mcg/ml on Vanc 1 gram IV q24hrs ->increased to 1250 mg IV q24hrs (target troughs 15-20 mcg/ml)  Microbiology results: 1/29 blood x2 - negative 1/29 MRSA PCR neg  Thank you for allowing pharmacy to be a part of this patient's care.  Arty Baumgartner, [place] Pager: [phone] 08/05/2016, 2:54 PM

## 2016-08-05 NOTE — Progress Notes (Signed)
PROGRESS NOTE    [name]  [id number] DOB: December 22, 1919 DOA: 07/30/2016 PCP: [name], MD   Brief Narrative: 83 y.o. female with medical history significant   diastolic heart failure, A. fib, obstructive sleep apnea on C Pap, 16 bowel obstructions in the last 5 years, ovarian cancer, bladder cancer, hypertension, hypothyroidism venous insufficiency presents to the emergency department from the facility with chief complaint sudden worsening shortness of breath and gradual worsening lower extremity edema. So evaluation yields acute respiratory failure hypoxia likely related to healthcare associated pneumonia in the setting of mild CHF exacerbation.  Assessment & Plan:  # Acute hypoxic respiratory failure likely in the setting of healthcare associated pneumonia and acute on chronic diastolic congestive heart failure: -CT chest with cardiomegaly and moderate pericardial effusion. Also has bilateral pleural effusion. -Patient is responding well with IV diuretics. She is net negative by about 8.5 L. continue IV Lasix 60 mg twice a day today and probably switch to oral tomorrow. Chest x-ray with improving right pleural effusion and opacity. -Continue cefepime for now. Cultures negative so far. -Continue bronchodilators.  #Healthcare associated pneumonia: Continue cefepime, off vancomycin Follow culture. Influenza negative.  # Acute on chronic congestive heart failure with preserved EF: Patient had an echocardiogram about 2 weeks ago which was consistent with EF of 55-60%. -Continue IV diuretics as above. Cardiology consult appreciated. -Mild elevation in troponin likely due to congestive heart failure. -Continue potassium chloride, lisinopril, metoprolol.  #Hypokalemia in the setting of diuretics: Potassium acceptable. Not on supplement now. Monitor BMP.  #Atrial fibrillation, paroxysmal: Continue diltiazem, metoprolol. Patient was tachycardic this morning. -She pulled out  PICC line this morning and has surrounding ecchymosis. I'm holding eliquis today. Check CBC.  #Lateral lower extremity edema: Probably exacerbated by CHF. On Lasix.  -Unna boot, wound care  #Diabetes: Continue sliding scale. Monitor blood sugar level.  #hypertension: Monitor blood pressure. Continue current medications  #History of bowel obstruction: Continue with the bowel regimen. Watch for bowel movement closely.  #Pressure injury of the skin around sacrum, unspecified: Patient also with lower extremities chronic dermatitis and edema. Wound care consult appreciated. Ordered a dose of Diflucan. Encourage more activities and out of bed.  #Goals of care discussion: She is DO NOT RESUSCITATE.  PT, OT evaluation pending. Likely discharge to SNF when patient is medically stable. Social worker consulted.  Principal Problem:   Acute respiratory failure with hypoxia (HCC) Active Problems:   Chronic diastolic heart failure (HCC)   Atrial fibrillation (HCC)   Hypokalemia   Diabetes mellitus (HCC)   Hypothyroidism   Venous insufficiency   Hyperlipidemia   PNA (pneumonia)   Essential hypertension   Bowel obstruction   HCAP (healthcare-associated pneumonia)   Bilateral leg edema   Diabetes mellitus with complication (HCC)   Goals of care, counseling/discussion   Pressure injury of skin  DVT prophylaxis:Systemic anticoagulation  Code Status:Partial DO NOT RESUSCITATE.  Family Communication: No family present at bedside. Discussed with the patient's daughter on  2/2 Disposition Plan: Likely discharge to SNF in 2-3 days. Social worker evaluation ongoing.   Consultants:   Cardiologist  Procedures:None  Antimicrobials:Vancomycin discontinued on 2/2 Continue cefepime.  Subjective:Patient was seen and examined at bedside. Sitting on chair. Patient pulled out PICC line last night. Currently has gauze no bleeding noticed. He reported feeling better. Denied shortness of breath or chest  pain. Cough is better. No nausea or vomiting. She has weakness.  Objective: Vitals:   08/05/16 [id number] 08/05/16 0500 08/05/16 [id number] 08/05/16 [id number]  BP: 124/80  131/75 131/75  Pulse: 87  (!) 125   Resp: 18     Temp: 97.6 F (36.4 C)     TempSrc: Oral     SpO2: 96%     Weight:  95.6 kg (210 lb 11.2 oz)    Height:        Intake/Output Summary (Last 24 hours) at 08/05/16 1104 Last data filed at 08/05/16 0300  Gross per 24 hour  Intake              120 ml  Output             1350 ml  Net            -1230 ml   Filed Weights   08/03/16 0500 08/04/16 0500 08/05/16 0500  Weight: 95.7 kg (210 lb 14.4 oz) 95.6 kg (210 lb 11.2 oz) 95.6 kg (210 lb 11.2 oz)    Examination:  General exam: Elderly female, not in distress Respiratory system: Clear bilateral, no wheezing Cardiovascular system: Irregular heart rate, tachycardic, S1-S2 normal. Gastrointestinal system: Abdomen soft, nontender. Bowel sound positive. Central nervous system: Alert awake, following commands.  Extremities:Lateral lower extremity with dressing on, edema+. Right upper extremity PICC line site has a small bruises, no bleeding noticed. Skin: No rashes, lesions or ulcers Psychiatry: Judgement and insight appear normal..   Foley catheter with clear urine.    Data Reviewed: I have personally reviewed following labs and imaging studies  CBC:  Recent Labs Lab 07/30/16 1459 07/31/16 0623 08/01/16 0642 08/02/16 0400  WBC 5.5 3.3* 8.9 7.0  NEUTROABS 5.0  --   --   --   HGB 13.8 13.0 14.3 13.7  HCT 43.4 41.1 46.6* 44.4  MCV 94.3 95.1 97.9 95.5  PLT 118* 125* 130* [id number]*   Basic Metabolic Panel:  Recent Labs Lab 08/01/16 0642 08/02/16 0400 08/02/16 0416 08/03/16 0515 08/04/16 0420 08/05/16 0500  NA 141  --  141 141 140 139  K 3.9  --  3.3* 4.3 4.8 4.6  CL 102  --  93* 94* 96* 92*  CO2 29  --  39* 38* 38* 35*  GLUCOSE 117*  --  102* 108* 102* 111*  BUN 18  --  15 18 17  25*  CREATININE 0.68  --  0.70 0.71  0.77 0.80  CALCIUM 8.9  --  8.1* 8.5* 8.7* 9.2  MG  --  1.8  --  2.0  --  2.2  PHOS 3.5  --  3.1 3.0 3.0 3.1   GFR: Estimated Creatinine Clearance: 45.4 mL/min (by C-G formula based on SCr of 0.8 mg/dL). Liver Function Tests:  Recent Labs Lab 07/30/16 1459 08/01/16 [id number] 08/02/16 0416 08/03/16 0515 08/04/16 0420 08/05/16 0500  AST 22  --   --   --   --   --   ALT 11*  --   --   --   --   --   ALKPHOS 47  --   --   --   --   --   BILITOT 0.5  --   --   --   --   --   PROT 6.2*  --   --   --   --   --   ALBUMIN 3.7 3.6 3.3* 3.4* 3.3* 3.5   No results for input(s): LIPASE, AMYLASE in the last 168 hours. No results for input(s): AMMONIA in the last 168 hours. Coagulation Profile: No results for input(s): INR, PROTIME  in the last 168 hours. Cardiac Enzymes: No results for input(s): CKTOTAL, CKMB, CKMBINDEX, TROPONINI in the last 168 hours. BNP (last 3 results) No results for input(s): PROBNP in the last 8760 hours. HbA1C: No results for input(s): HGBA1C in the last 72 hours. CBG:  Recent Labs Lab 08/03/16 1636 08/03/16 2151 08/04/16 1704 08/04/16 2212 08/05/16 0900  GLUCAP 91 110* 96 107* 136*   Lipid Profile: No results for input(s): CHOL, HDL, LDLCALC, TRIG, CHOLHDL, LDLDIRECT in the last 72 hours. Thyroid Function Tests: No results for input(s): TSH, T4TOTAL, FREET4, T3FREE, THYROIDAB in the last 72 hours. Anemia Panel: No results for input(s): VITAMINB12, FOLATE, FERRITIN, TIBC, IRON, RETICCTPCT in the last 72 hours. Sepsis Labs:  Recent Labs Lab 07/30/16 1524  LATICACIDVEN 1.73    Recent Results (from the past 240 hour(s))  Blood Culture (routine x 2)     Status: None   Collection Time: 07/30/16  3:20 PM  Result Value Ref Range Status   Specimen Description BLOOD RIGHT ARM  Final   Special Requests BOTTLES DRAWN AEROBIC AND ANAEROBIC 5CC  Final   Culture NO GROWTH 5 DAYS  Final   Report Status 08/04/2016 FINAL  Final  Blood Culture (routine x 2)      Status: None   Collection Time: 07/30/16  3:50 PM  Result Value Ref Range Status   Specimen Description BLOOD RIGHT HAND  Final   Special Requests BOTTLES DRAWN AEROBIC AND ANAEROBIC 5CC  Final   Culture NO GROWTH 5 DAYS  Final   Report Status 08/04/2016 FINAL  Final  MRSA PCR Screening     Status: None   Collection Time: 07/31/16 12:06 AM  Result Value Ref Range Status   MRSA by PCR NEGATIVE NEGATIVE Final    Comment:        The GeneXpert MRSA Assay (FDA approved for NASAL specimens only), is one component of a comprehensive MRSA colonization surveillance program. It is not intended to diagnose MRSA infection nor to guide or monitor treatment for MRSA infections.          Radiology Studies: Dg Chest Port 1 View  Result Date: 08/04/2016 CLINICAL DATA:  Shortness of breath. Bilateral lower extremity edema. EXAM: PORTABLE CHEST 1 VIEW COMPARISON:  August 01, 2016 FINDINGS: No pneumothorax. The right PICC line is stable. Stable cardiomegaly. The right pleural effusion and underlying opacity has decreased. No overt edema. Stable sclerotic lesion in the left humeral head. No other acute abnormalities. IMPRESSION: The right pleural effusion and underlying opacity is smaller in the interval. Stable cardiomegaly. Electronically Signed   By: Dorise Bullion III M.D   On: 08/04/2016 12:20        Scheduled Meds: . Derrill Memo ON 08/06/2016] apixaban  5 mg Oral BID  . bisacodyl  5 mg Oral Daily  . ceFEPime (MAXIPIME) IV  1 g Intravenous Q24H  . diltiazem  360 mg Oral Daily  . ferrous sulfate  325 mg Oral Q breakfast  . fluconazole  150 mg Oral Once  . furosemide  60 mg Intravenous BID  . insulin aspart  0-15 Units Subcutaneous TID WC  . insulin aspart  0-5 Units Subcutaneous QHS  . ipratropium-albuterol  3 mL Nebulization TID  . levothyroxine  125 mcg Oral QAC breakfast  . lisinopril  5 mg Oral Daily  . metoprolol tartrate  25 mg Oral BID  . senna-docusate  1 tablet Oral BID  .  sodium chloride flush  10-40 mL Intracatheter Q12H  . sodium  chloride flush  3 mL Intravenous Q12H   Continuous Infusions:   LOS: 5 days    Koa Zoeller Tanna Furry, MD [facility] Pager [phone]  If 7PM-7AM, please contact night-coverage [email or web address] Password [email or web address] 08/05/2016, 11:04 AM

## 2016-08-05 NOTE — Progress Notes (Signed)
Peripherally Inserted Central Catheter/Midline Placement  The IV Nurse has discussed with the patient and/or persons authorized to consent for the patient, the purpose of this procedure and the potential benefits and risks involved with this procedure.  The benefits include less needle sticks, lab draws from the catheter, and the patient may be discharged home with the catheter. Risks include, but not limited to, infection, bleeding, blood clot (thrombus formation), and puncture of an artery; nerve damage and irregular heartbeat and possibility to perform a PICC exchange if needed/ordered by physician.  Alternatives to this procedure were also discussed.  Bard Power PICC patient education guide, fact sheet on infection prevention and patient information card has been provided to patient /or left at bedside.    PICC/Midline Placement Documentation  PICC Double Lumen [id number] PICC Right Basilic 40 cm 0 cm (Active)  Indication for Insertion or Continuance of Line Limited venous access - need for IV therapy >5 days (PICC only);Prolonged intravenous therapies;Chronic illness with exacerbations (CF, Sickle Cell, etc.) 08/05/2016 11:02 AM  Exposed Catheter (cm) 0 cm 08/05/2016 11:02 AM  Site Assessment Clean;Dry;Intact;Ecchymotic 08/05/2016 11:02 AM  Lumen #1 Status Flushed;Saline locked;Blood return noted 08/05/2016 11:02 AM  Lumen #2 Status Flushed;Saline locked;Blood return noted 08/05/2016 11:02 AM  Dressing Type Transparent 08/05/2016 11:02 AM  Dressing Status Clean;Dry;Intact;Antimicrobial disc in place 08/05/2016 11:02 AM  Line Care Connections checked and tightened 08/05/2016 11:02 AM  Line Adjustment (NICU/IV Team Only) No 08/05/2016 11:02 AM  Dressing Intervention New dressing 08/05/2016 11:02 AM  Dressing Change Due 08/12/16 08/05/2016 11:02 AM       [name] 08/05/2016, 11:03 AM

## 2016-08-05 NOTE — Progress Notes (Signed)
Progress Note  Patient Name: [name] Date of Encounter: 08/05/2016  Primary Cardiologist: Allred  Subjective   Another 1 L of net diuresis in the last 24 hours, but weight remains stubborn Lee unchanged from last couple of days. Sodium level and renal function remain normal. Atrial fibrillation with good rate control at night and no significant pauses. In the morning, the ventricular rate is a little high, suggesting that the previous dose of diltiazem may be wearing off before 24 hours. Chest x-ray shows reduction in the pleural effusion and overall pulmonary congestion  Inpatient Medications    Scheduled Meds: . apixaban  5 mg Oral BID  . bisacodyl  5 mg Oral Daily  . ceFEPime (MAXIPIME) IV  1 g Intravenous Q24H  . diltiazem  360 mg Oral Daily  . ferrous sulfate  325 mg Oral Q breakfast  . fluconazole  150 mg Oral Once  . furosemide  60 mg Intravenous BID  . insulin aspart  0-15 Units Subcutaneous TID WC  . insulin aspart  0-5 Units Subcutaneous QHS  . ipratropium-albuterol  3 mL Nebulization TID  . levothyroxine  125 mcg Oral QAC breakfast  . lisinopril  5 mg Oral Daily  . metoprolol tartrate  25 mg Oral BID  . senna-docusate  1 tablet Oral BID  . sodium chloride flush  10-40 mL Intracatheter Q12H  . sodium chloride flush  3 mL Intravenous Q12H   Continuous Infusions:  PRN Meds: acetaminophen **OR** acetaminophen, albuterol, HYDROcodone-acetaminophen, ondansetron **OR** ondansetron (ZOFRAN) IV, sodium chloride flush, sodium phosphate   Vital Signs    Vitals:   08/05/16 0349 08/05/16 0500 08/05/16 0937 08/05/16 0938  BP: 124/80  131/75 131/75  Pulse: 87  (!) 125   Resp: 18     Temp: 97.6 F (36.4 C)     TempSrc: Oral     SpO2: 96%     Weight:  95.6 kg (210 lb 11.2 oz)    Height:        Intake/Output Summary (Last 24 hours) at 08/05/16 1050 Last data filed at 08/05/16 0300  Gross per 24 hour  Intake              120 ml  Output             1350 ml    Net            -1230 ml   Filed Weights   08/03/16 0500 08/04/16 0500 08/05/16 0500  Weight: 95.7 kg (210 lb 14.4 oz) 95.6 kg (210 lb 11.2 oz) 95.6 kg (210 lb 11.2 oz)    Telemetry    Atrial fibrillation with generally good ventricular rate control, rare RVR - Personally Reviewed   Physical Exam  Appears elderly and frail, but alert, oriented and comfortable GEN: No acute distress.   Neck:  JVP 6 cm Cardiac:  irregular, no murmurs, rubs, or gallops.  Respiratory: Clear to auscultation bilaterally. GI: Soft, nontender, non-distended  MS:  1+ calf edema edema; No deformity. Neuro:  Nonfocal  Psych: Normal affect   Labs    Chemistry Recent Labs Lab 07/30/16 1459  08/03/16 0515 08/04/16 0420 08/05/16 0500  NA 141  < > 141 140 139  K 3.2*  < > 4.3 4.8 4.6  CL 102  < > 94* 96* 92*  CO2 27  < > 38* 38* 35*  GLUCOSE 196*  < > 108* 102* 111*  BUN 11  < > 18 17 25*  CREATININE 0.78  < > 0.71 0.77 0.80  CALCIUM 8.5*  < > 8.5* 8.7* 9.2  PROT 6.2*  --   --   --   --   ALBUMIN 3.7  < > 3.4* 3.3* 3.5  AST 22  --   --   --   --   ALT 11*  --   --   --   --   ALKPHOS 47  --   --   --   --   BILITOT 0.5  --   --   --   --   GFRNONAA >60  < > >60 >60 >60  GFRAA >60  < > >60 >60 >60  ANIONGAP 12  < > 9 6 12   < > = values in this interval not displayed.   Hematology Recent Labs Lab 07/31/16 [id number] 08/01/16 0642 08/02/16 0400  WBC 3.3* 8.9 7.0  RBC 4.32 4.76 4.65  HGB 13.0 14.3 13.7  HCT 41.1 46.6* 44.4  MCV 95.1 97.9 95.5  MCH 30.1 30.0 29.5  MCHC 31.6 30.7 30.9  RDW 15.3 15.7* 15.2  PLT 125* 130* 124*    Cardiac EnzymesNo results for input(s): TROPONINI in the last 168 hours. No results for input(s): TROPIPOC in the last 168 hours.   BNP Recent Labs Lab 07/30/16 1512  BNP 184.2*     DDimer No results for input(s): DDIMER in the last 168 hours.   Radiology    Dg Chest Port 1 View  Result Date: 08/04/2016 CLINICAL DATA:  Shortness of breath. Bilateral lower  extremity edema. EXAM: PORTABLE CHEST 1 VIEW COMPARISON:  August 01, 2016 FINDINGS: No pneumothorax. The right PICC line is stable. Stable cardiomegaly. The right pleural effusion and underlying opacity has decreased. No overt edema. Stable sclerotic lesion in the left humeral head. No other acute abnormalities. IMPRESSION: The right pleural effusion and underlying opacity is smaller in the interval. Stable cardiomegaly. Electronically Signed   By: Dorise Bullion III M.D   On: 08/04/2016 12:20    Cardiac Studies   07/20/2015 Echo - Left ventricle: The cavity size was mildly dilated. Wall thickness was increased in a pattern of mild LVH. Systolic function was normal. The estimated ejection fraction was in the range of 55% to 60%. Wall motion was normal; there were no regional wall motion abnormalities. - Aortic valve: Valve mobility was restricted. There was moderate stenosis. - Mitral valve: Calcified annulus. Mildly thickened leaflets . There was moderate regurgitation. - Left atrium: The atrium was severely dilated. - Right atrium: The atrium was mildly dilated. - Pulmonary arteries: Systolic pressure was moderately increased. PA peak pressure: 61 mm Hg (S). - Pericardium, extracardiac: A small pericardial effusion was identified.  Impressions:  - Normal LV systolic function; biatrial enlargement; heavily calcified aortic valve with moderate AS by mean gradient; moderate MR; mild TR with moderately elevated pulmonary pressure; small pericardial effusion.   Patient Profile     83 y.o. female with permanent atrial fibrillation, Moderate aortic stenosis, chronic diastolic heart failure, obstructive sleep apnea and moderate pulmonary hypertension, diabetes mellitus, presenting with acute exacerbation of chronic heart failure in the setting of health care acquired pneumonia   Assessment & Plan      1. CHF: She is still hypervolemic. I think she needs to  continue intravenous diuretics one more day. 2. AFib: Fair rate control on current medications, but the diltiazem seems to be wearing off by the following morning. Will increase the beta blocker dose. On appropriate  anticoagulation. 3. HTN: Well controlled. There is a little room to increase rate control medications. 4. OSA: Recommend CPAP at night 5. Pressure ulcer: Precedes this hospitalization, but will likely worsen unless she is mobilized more frequently. Have to start getting her up.  Signed, Sanda Klein, MD  08/05/2016, 10:50 AM

## 2016-08-06 ENCOUNTER — Telehealth: Payer: Self-pay | Admitting: *Deleted

## 2016-08-06 DIAGNOSIS — I5043 Acute on chronic combined systolic (congestive) and diastolic (congestive) heart failure: Secondary | ICD-10-CM

## 2016-08-06 LAB — RENAL FUNCTION PANEL
Albumin: 3.1 g/dL — ABNORMAL LOW (ref 3.5–5.0)
Anion gap: 6 (ref 5–15)
BUN: 26 mg/dL — ABNORMAL HIGH (ref 6–20)
CALCIUM: 8.5 mg/dL — AB (ref 8.9–10.3)
CHLORIDE: 97 mmol/L — AB (ref 101–111)
CO2: 37 mmol/L — AB (ref 22–32)
Creatinine, Ser: 0.8 mg/dL (ref 0.44–1.00)
GFR calc non Af Amer: >60 mL/min (ref >60)
GLUCOSE: 97 mg/dL (ref 65–99)
POTASSIUM: 3.9 mmol/L (ref 3.5–5.1)
Phosphorus: 3.9 mg/dL (ref 2.5–4.6)
SODIUM: 140 mmol/L (ref 135–145)

## 2016-08-06 LAB — CBC
HEMATOCRIT: 42.1 % (ref 36.0–46.0)
HEMOGLOBIN: 13.1 g/dL (ref 12.0–15.0)
MCH: 28.9 pg (ref 26.0–34.0)
MCHC: 31.1 g/dL (ref 30.0–36.0)
MCV: 92.7 fL (ref 78.0–100.0)
Platelets: 138 10*3/uL — ABNORMAL LOW (ref 150–400)
RBC: 4.54 MIL/uL (ref 3.87–5.11)
RDW: 14.9 % (ref 11.5–15.5)
WBC: 7.8 10*3/uL (ref 4.0–10.5)

## 2016-08-06 LAB — GLUCOSE, CAPILLARY
GLUCOSE-CAPILLARY: 127 mg/dL — AB (ref 65–99)
Glucose-Capillary: 94 mg/dL (ref 65–99)
Glucose-Capillary: 97 mg/dL (ref 65–99)
Glucose-Capillary: 119 mg/dL — ABNORMAL HIGH (ref 65–99)

## 2016-08-06 LAB — TSH: TSH: 9.573 u[IU]/mL — ABNORMAL HIGH (ref 0.350–4.500)

## 2016-08-06 LAB — T4, FREE: FREE T4: 1.09 ng/dL (ref 0.61–1.12)

## 2016-08-06 MED ORDER — FUROSEMIDE 40 MG PO TABS
60.0000 mg | ORAL_TABLET | Freq: Every day | ORAL | Status: DC
  Administered 2016-08-06 – 2016-08-07 (×2): 60 mg via ORAL
  Filled 2016-08-06 (×2): qty 1

## 2016-08-06 NOTE — Progress Notes (Addendum)
Progress Note  Patient Name: [name] Date of Encounter: 08/06/2016  Primary Cardiologist: Allred  Subjective   Patient feeling well today, asking about going home. Continues to have productive cough with clear sputum, unchanged. Breathing improved, nausea resolved. Feels fluid level has decreased - daughter says her legs look "totally deflated." She says for years her mom has been told to elevate her legs but is noncompliant with this. Legs now look back to baseline. Continues to have chronic orthopnea, states she has propped herself up while sleeping for the past year. No longer uses CPAP for OSA. Denies palpitations or chest pain.   Patient states has not walked since admission. Unsure if this would produce symptoms.   Inpatient Medications    Scheduled Meds: . apixaban  5 mg Oral BID  . bisacodyl  5 mg Oral Daily  . ceFEPime (MAXIPIME) IV  1 g Intravenous Q24H  . diltiazem  360 mg Oral Daily  . ferrous sulfate  325 mg Oral Q breakfast  . furosemide  60 mg Intravenous BID  . insulin aspart  0-15 Units Subcutaneous TID WC  . insulin aspart  0-5 Units Subcutaneous QHS  . ipratropium-albuterol  3 mL Nebulization TID  . levothyroxine  125 mcg Oral QAC breakfast  . lisinopril  5 mg Oral Daily  . metoprolol tartrate  50 mg Oral BID  . senna-docusate  1 tablet Oral BID  . sodium chloride flush  10-40 mL Intracatheter Q12H  . sodium chloride flush  10-40 mL Intracatheter Q12H  . sodium chloride flush  3 mL Intravenous Q12H   Continuous Infusions:  PRN Meds: acetaminophen **OR** acetaminophen, albuterol, HYDROcodone-acetaminophen, ondansetron **OR** ondansetron (ZOFRAN) IV, sodium chloride flush, sodium chloride flush, sodium phosphate   Vital Signs    Vitals:   08/05/16 1546 08/05/16 1946 08/05/16 2200 08/06/16 0427  BP: (!) 92/55  101/63 120/67  Pulse: 66  90 79  Resp: 15  20 (!) 31  Temp:   97.4 F (36.3 C) 97.5 F (36.4 C)  TempSrc:   Oral Oral  SpO2: 96% 98%  95% 96%  Weight:    95.3 kg (210 lb)  Height:        Intake/Output Summary (Last 24 hours) at 08/06/16 0845 Last data filed at 08/06/16 0516  Gross per 24 hour  Intake              620 ml  Output             1600 ml  Net             -980 ml   Filed Weights   08/04/16 0500 08/05/16 0500 08/06/16 0427  Weight: 95.6 kg (210 lb 11.2 oz) 95.6 kg (210 lb 11.2 oz) 95.3 kg (210 lb)    Telemetry    Atrial fibrillation with generally good ventricular rate control. Some nocturnal bradycardia- Personally Reviewed  Physical Exam   GEN: Elderly female, orientated and in no acute distress.  HEENT: Normocephalic, atraumatic, sclera non-icteric. Neck: Mild JVD present. No bruits. Cardiac: Regular rate, irregular rhythm. no murmurs, rubs, or gallops.  Respiratory: Decreased breath sounds bilaterally. Breathing is unlabored. GI: Soft, nontender. Mild distension, BS +x 4. MS: no deformity. Extremities: No clubbing or cyanosis. Chronic lower extremity edema present with wrapping present. Neuro:  AAOx3. Follows commands. Psych:  Responds to questions appropriately with a normal affect.  Labs    Chemistry Recent Labs Lab 07/30/16 1459  08/04/16 0420 08/05/16 0500 08/06/16 0448  NA 141  < > 140 139 140  K 3.2*  < > 4.8 4.6 3.9  CL 102  < > 96* 92* 97*  CO2 27  < > 38* 35* 37*  GLUCOSE 196*  < > 102* 111* 97  BUN 11  < > 17 25* 26*  CREATININE 0.78  < > 0.77 0.80 0.80  CALCIUM 8.5*  < > 8.7* 9.2 8.5*  PROT 6.2*  --   --   --   --   ALBUMIN 3.7  < > 3.3* 3.5 3.1*  AST 22  --   --   --   --   ALT 11*  --   --   --   --   ALKPHOS 47  --   --   --   --   BILITOT 0.5  --   --   --   --   GFRNONAA >60  < > >60 >60 >60  GFRAA >60  < > >60 >60 >60  ANIONGAP 12  < > 6 12 6   < > = values in this interval not displayed.   Hematology Recent Labs Lab 08/02/16 0400 08/05/16 1130 08/06/16 0448  WBC 7.0 7.7 7.8  RBC 4.65 4.40 4.54  HGB 13.7 13.1 13.1  HCT 44.4 41.2 42.1  MCV 95.5 93.6  92.7  MCH 29.5 29.8 28.9  MCHC 30.9 31.8 31.1  RDW 15.2 14.7 14.9  PLT 124* 131* 138*    BNP Recent Labs Lab 07/30/16 1512  BNP 184.2*     Radiology    Dg Chest Port 1 View  Result Date: 08/05/2016 CLINICAL DATA:  Confirm line placement. EXAM: PORTABLE CHEST 1 VIEW COMPARISON:  Chest x-ray dated 08/04/2016. FINDINGS: Cardiomegaly is stable. Right-sided PICC line in place with tip adequately positioned at the level of the mid/ upper SVC. Atherosclerotic changes noted at the aortic arch. No evidence of pulmonary edema or pneumonia. No pleural effusion or pneumothorax seen. IMPRESSION: 1. Right-sided PICC line appears adequately positioned with tip at the level of the mid/upper SVC. 2. Stable cardiomegaly. No evidence of active CHF or pulmonary edema. 3. No significant change compared to yesterday's chest x-ray. Improved aeration at the lung bases compared to the earlier chest x-ray of 08/01/2016. Electronically Signed   By: Franki Cabot M.D.   On: 08/05/2016 11:43   Dg Chest Port 1 View  Result Date: 08/04/2016 CLINICAL DATA:  Shortness of breath. Bilateral lower extremity edema. EXAM: PORTABLE CHEST 1 VIEW COMPARISON:  August 01, 2016 FINDINGS: No pneumothorax. The right PICC line is stable. Stable cardiomegaly. The right pleural effusion and underlying opacity has decreased. No overt edema. Stable sclerotic lesion in the left humeral head. No other acute abnormalities. IMPRESSION: The right pleural effusion and underlying opacity is smaller in the interval. Stable cardiomegaly. Electronically Signed   By: Dorise Bullion III M.D   On: 08/04/2016 12:20    Cardiac Studies    07/20/2015 Echo - Left ventricle: The cavity size was mildly dilated. Wall thickness was increased in a pattern of mild LVH. Systolic function was normal. The estimated ejection fraction was in the range of 55% to 60%. Wall motion was normal; there were no regional wall motion abnormalities. - Aortic  valve: Valve mobility was restricted. There was moderate stenosis. - Mitral valve: Calcified annulus. Mildly thickened leaflets . There was moderate regurgitation. - Left atrium: The atrium was severely dilated. - Right atrium: The atrium was mildly dilated. - Pulmonary arteries: Systolic pressure  was moderately increased. PA peak pressure: 61 mm Hg (S). - Pericardium, extracardiac: A small pericardial effusion was identified.  Impressions:  - Normal LV systolic function; biatrial enlargement; heavily calcified aortic valve with moderate AS by mean gradient; moderate MR; mild TR with moderately elevated pulmonary pressure; small pericardial effusion.   Patient Profile     83 y.o.femalewith permanent atrial fibrillation, moderate aortic stenosis, chronic diastolic heart failure, obstructive sleep apnea and moderate pulmonary hypertension, diabetes mellitus, hypothyroidism, presenting with acute exacerbation of chronic heart failure in the setting of health care acquired pneumonia.  Assessment & Plan    1. Acute on chronic diastolic CHF: Breathing improving. Chronic LEE persists but back to baseline per patient and daughter. Baseline outpatient weight 219. Current weight 210. She is down 1L and 11.2oz since yesterday. 9.5L, 10lbs since admission. BUN/CO2 beginning to bump. CXR yesterday improved. Will d/c IV Lasix and start Lasix 60mg  daily (prior home dose before recent increase was 40mg  daily). She was previously instructed to take 1 extra tablet daily as needed for weight gain and this still seems appropriate.  2. Permanent AFib: Improved control on current medications during day - metoprolol increased yesterday due to RVR. Still with some bradycardia at night. Patient is not symptomatic from this. Follow on current regimen. On appropriate anticoagulation.  3. HTN: BP soft yesterday, improved this AM. Change Lasix as above.    4. OSA: Recommend CPAP at night. She  states she d/c this herself at her assisted living facility.   5. Pressure ulcer: Precedes this hospitalization, but will likely worsen unless she is mobilized more frequently. Have to start getting her up. States she has not got up or walked around since admission.   6. DM: A1c 5.6 on 07/31/16  7. Hypothyroidism: Last TSH 07/19/15, 0.248. Recheck in AM if she is still admitted.  8. HCAP: Per IM  Patient personally seen/examined after PA student. Note formulated per our discussion. Multiple changes made to note where appropriate. Tentatively made TOC F/u on 08/15/16 at 3pm (sent message to triage for phone call.   Dayna Dunn PA-C 08/06/2016 9:07 AM  I have personally seen and examined this patient with Melina Copa, PA-C. I agree with the assessment and plan as outlined above. She has diuresed well. Will change to po Lasix today. HR is controlled. She should be ready for d/c tomorrow. Cardiology will sign off. Follow up with cardiology has been arranged. Please call with questions.   Lauree Chandler 08/06/2016 10:04 AM

## 2016-08-06 NOTE — Progress Notes (Signed)
Pt has been having pauses while sleeping. Longest pauses 2.82, pt asymptomatic. Will cont to monitor pt.

## 2016-08-06 NOTE — Progress Notes (Signed)
Occupational Therapy Treatment Patient Details Name: [name] MRN: [id number] DOB: [birth year] Today's Date: 08/06/2016    History of present illness patient is a 83 y/o female with hx of HF, A-fib, ovarian ca, bladder cancer, sleep apnea, DM, breast ca presents with acute on chronic diastolic CHF and HCAP   OT comments  Pt demonstrates improved activity tolerance and improved functional transfers.  She was incontinent of urine and assisted with peri care and clean up in standing position.  Sp02 remained >88% on 2L.  Pt is very motivated to work with therapies   Follow Up Recommendations  SNF;Supervision/Assistance - 24 hour    Equipment Recommendations  None recommended by OT    Recommendations for Other Services      Precautions / Restrictions Precautions Precautions: Fall Precaution Comments: watch 02 Restrictions Weight Bearing Restrictions: No       Mobility Bed Mobility               General bed mobility comments: up in chair   Transfers Overall transfer level: Needs assistance Equipment used: Rolling walker (2 wheeled) Transfers: Sit to/from Stand Sit to Stand: Mod assist         General transfer comment: verbal cues for hand placement, and assist to move into standing     Balance Overall balance assessment: Needs assistance Sitting-balance support: Feet supported Sitting balance-Leahy Scale: Fair Sitting balance - Comments: sitting EOC. Postural control: Right lateral lean Standing balance support: During functional activity;Bilateral upper extremity supported Standing balance-Leahy Scale: Poor Standing balance comment: requires bil. UE support and min guard assist                    ADL Overall ADL's : Needs assistance/impaired                         Toilet Transfer: Moderate assistance;Stand-pivot;BSC;RW   Toileting- Clothing Manipulation and Hygiene: Total assistance;Sit to/from stand Toileting - Clothing Manipulation  Details (indicate cue type and reason): Pt incontinent of urine.  Assisted her with clean up and peri care      Functional mobility during ADLs: Moderate assistance;Rolling walker        Vision                     Perception     Praxis      Cognition   Behavior During Therapy: WFL for tasks assessed/performed Overall Cognitive Status: No family/caregiver present to determine baseline cognitive functioning                  General Comments: Pt with apparent memory deficit     Extremity/Trunk Assessment               Exercises     Shoulder Instructions       General Comments      Pertinent Vitals/ Pain       Pain Assessment: Faces Faces Pain Scale: Hurts little more Pain Location: groin area during peri care  Pain Descriptors / Indicators: Grimacing;Burning Pain Intervention(s): Monitored during session;Other (comment) (applied barrier cream )  Home Living                                          Prior Functioning/Environment              Frequency  Min  2X/week        Progress Toward Goals  OT Goals(current goals can now be found in the care plan section)  Progress towards OT goals: Progressing toward goals     Plan Discharge plan remains appropriate    Co-evaluation                 End of Session Equipment Utilized During Treatment: Rolling walker;Oxygen   Activity Tolerance Patient tolerated treatment well   Patient Left in chair;with call bell/phone within reach;with chair alarm set   Nurse Communication Mobility status        Time: [id number] OT Time Calculation (min): 33 min  Charges: OT General Charges $OT Visit: 1 Procedure OT Treatments $Self Care/Home Management : 23-37 mins  Brier Reid M 08/06/2016, 6:24 PM

## 2016-08-06 NOTE — Progress Notes (Addendum)
PROGRESS NOTE    [name]  [id number] DOB: 07-28-1919 DOA: 07/30/2016 PCP: [name], MD   Brief Narrative: 83 y.o. female with medical history significant   diastolic heart failure, A. fib, obstructive sleep apnea on C Pap, 16 bowel obstructions in the last 5 years, ovarian cancer, bladder cancer, hypertension, hypothyroidism venous insufficiency presents to the emergency department from the facility with chief complaint sudden worsening shortness of breath and gradual worsening lower extremity edema. So evaluation yields acute respiratory failure hypoxia likely related to healthcare associated pneumonia in the setting of mild CHF exacerbation.  Assessment & Plan:  # Acute hypoxic respiratory failure likely in the setting of healthcare associated pneumonia and acute on chronic diastolic congestive heart failure: -CT chest with cardiomegaly and moderate pericardial effusion. Also has bilateral pleural effusion. -Patient is responding well with IV diuretics. She is net negative by about 9.5 L. patient is clinically improved. Changing to oral Lasix today.  -Planned to discontinue Foley catheter and bladder scan later on. Discussed with the patient starts.  -Patient is currently on 6 L of oxygen, plan to wean down oxygen gradually. She does not use oxygen at home.  -Continue cefepime for now, likely dc tomorrow. Cultures negative so far. -Continue bronchodilators.  #Healthcare associated pneumonia: Continue cefepime, off vancomycin Follow culture. Influenza negative.  # Acute on chronic congestive heart failure with preserved EF: Patient had an echocardiogram about 2 weeks ago which was consistent with EF of 55-60%. -oral lasix as above. Cardiology consult appreciated. -Mild elevation in troponin likely due to congestive heart failure. -Continue potassium chloride, lisinopril, metoprolol.  #Hypokalemia in the setting of diuretics: Potassium acceptable. Not on supplement  now. Monitor BMP.  #Atrial fibrillation, paroxysmal: Continue diltiazem, metoprolol. eliquis for systemic anticoagulation  #Lateral lower extremity edema: Probably exacerbated by CHF. On Lasix.  -Unna boot, wound care -Edema improved significantly  #Diabetes: Continue sliding scale. Monitor blood sugar level.  #hypertension: Monitor blood pressure. Continue current medications  #History of bowel obstruction: Continue with the bowel regimen. Watch for bowel movement closely.  #Pressure injury of the skin around sacrum, unspecified: Patient also with lower extremities chronic dermatitis and edema. Wound care consult appreciated. Ordered a dose of Diflucan. Encourage more activities and out of bed.  #Goals of care discussion: She is DO NOT RESUSCITATE.  PT, OT evaluation . Discussed with the social worker regarding evaluation for SNF and possible discharge tomorrow. Since daughter at bedside. I discussed with her in detail.  Principal Problem:   Acute respiratory failure with hypoxia (HCC) Active Problems:   Acute on chronic combined systolic and diastolic CHF (congestive heart failure) (HCC)   Permanent atrial fibrillation (HCC)   Hypokalemia   Diabetes mellitus (HCC)   Hypothyroidism   Venous insufficiency   Hyperlipidemia   PNA (pneumonia)   Essential hypertension   Bowel obstruction   HCAP (healthcare-associated pneumonia)   Bilateral leg edema   Diabetes mellitus with complication (HCC)   Goals of care, counseling/discussion   Pressure injury of skin  DVT prophylaxis:Systemic anticoagulation  Code Status:Partial DO NOT RESUSCITATE.  Family Communication: Patient's daughter at bedside. Disposition Plan: Likely discharge to SNF tomorrow. Social worker evaluation ongoing.   Consultants:   Cardiologist  Procedures:None  Antimicrobials:Vancomycin discontinued on 2/2 Continue cefepime.  Subjective:Patient was seen and examined at bedside. Sitting on chair. Reported  feeling much better. Denied headache, dizziness, nausea, vomiting, chest pain. Has dry cough.  Objective: Vitals:   08/05/16 2200 08/06/16 0427 08/06/16 0921 08/06/16 1014  BP: 101/63 120/67  (!) 115/59  Pulse: 90 79  64  Resp: 20 (!) 31    Temp: 97.4 F (36.3 C) 97.5 F (36.4 C)    TempSrc: Oral Oral    SpO2: 95% 96% 92%   Weight:  95.3 kg (210 lb)    Height:        Intake/Output Summary (Last 24 hours) at 08/06/16 1134 Last data filed at 08/06/16 1100  Gross per 24 hour  Intake              860 ml  Output             1300 ml  Net             -440 ml   Filed Weights   08/04/16 0500 08/05/16 0500 08/06/16 0427  Weight: 95.6 kg (210 lb 11.2 oz) 95.6 kg (210 lb 11.2 oz) 95.3 kg (210 lb)    Examination:  General exam: Pleasant elderly female sitting on chair, not in distress Respiratory system: Clear bilaterally, no wheezing or crackle Cardiovascular system: Irregular heart rate, S1-S2 heard. Gastrointestinal system: Abdomen soft, nontender. Bowel sound positive. Central nervous system: Alert awake, following commands.  Extremities: Bilateral lower extremity edema improved. Skin: No rashes, lesions or ulcers Psychiatry: Judgement and insight appear normal..   Foley catheter with clear urine.    Data Reviewed: I have personally reviewed following labs and imaging studies  CBC:  Recent Labs Lab 07/30/16 1459 07/31/16 0623 08/01/16 0642 08/02/16 0400 08/05/16 1130 08/06/16 0448  WBC 5.5 3.3* 8.9 7.0 7.7 7.8  NEUTROABS 5.0  --   --   --   --   --   HGB 13.8 13.0 14.3 13.7 13.1 13.1  HCT 43.4 41.1 46.6* 44.4 41.2 42.1  MCV 94.3 95.1 97.9 95.5 93.6 92.7  PLT 118* 125* 130* 124* 131* 0000000*   Basic Metabolic Panel:  Recent Labs Lab 08/02/16 0400 08/02/16 0416 08/03/16 0515 08/04/16 0420 08/05/16 0500 08/06/16 0448  NA  --  141 141 140 139 140  K  --  3.3* 4.3 4.8 4.6 3.9  CL  --  93* 94* 96* 92* 97*  CO2  --  39* 38* 38* 35* 37*  GLUCOSE  --  102* 108*  102* 111* 97  BUN  --  15 18 17  25* 26*  CREATININE  --  0.70 0.71 0.77 0.80 0.80  CALCIUM  --  8.1* 8.5* 8.7* 9.2 8.5*  MG 1.8  --  2.0  --  2.2  --   PHOS  --  3.1 3.0 3.0 3.1 3.9   GFR: Estimated Creatinine Clearance: 45.3 mL/min (by C-G formula based on SCr of 0.8 mg/dL). Liver Function Tests:  Recent Labs Lab 07/30/16 1459  08/02/16 0416 08/03/16 0515 08/04/16 0420 08/05/16 0500 08/06/16 0448  AST 22  --   --   --   --   --   --   ALT 11*  --   --   --   --   --   --   ALKPHOS 47  --   --   --   --   --   --   BILITOT 0.5  --   --   --   --   --   --   PROT 6.2*  --   --   --   --   --   --   ALBUMIN 3.7  < > 3.3* 3.4* 3.3* 3.5 3.1*  < > =  values in this interval not displayed. No results for input(s): LIPASE, AMYLASE in the last 168 hours. No results for input(s): AMMONIA in the last 168 hours. Coagulation Profile: No results for input(s): INR, PROTIME in the last 168 hours. Cardiac Enzymes: No results for input(s): CKTOTAL, CKMB, CKMBINDEX, TROPONINI in the last 168 hours. BNP (last 3 results) No results for input(s): PROBNP in the last 8760 hours. HbA1C: No results for input(s): HGBA1C in the last 72 hours. CBG:  Recent Labs Lab 08/05/16 0900 08/05/16 1128 08/05/16 1708 08/05/16 2201 08/06/16 0750  GLUCAP 136* 105* 96 104* 94   Lipid Profile: No results for input(s): CHOL, HDL, LDLCALC, TRIG, CHOLHDL, LDLDIRECT in the last 72 hours. Thyroid Function Tests: No results for input(s): TSH, T4TOTAL, FREET4, T3FREE, THYROIDAB in the last 72 hours. Anemia Panel: No results for input(s): VITAMINB12, FOLATE, FERRITIN, TIBC, IRON, RETICCTPCT in the last 72 hours. Sepsis Labs:  Recent Labs Lab 07/30/16 1524  LATICACIDVEN 1.73    Recent Results (from the past 240 hour(s))  Blood Culture (routine x 2)     Status: None   Collection Time: 07/30/16  3:20 PM  Result Value Ref Range Status   Specimen Description BLOOD RIGHT ARM  Final   Special Requests BOTTLES  DRAWN AEROBIC AND ANAEROBIC 5CC  Final   Culture NO GROWTH 5 DAYS  Final   Report Status 08/04/2016 FINAL  Final  Blood Culture (routine x 2)     Status: None   Collection Time: 07/30/16  3:50 PM  Result Value Ref Range Status   Specimen Description BLOOD RIGHT HAND  Final   Special Requests BOTTLES DRAWN AEROBIC AND ANAEROBIC 5CC  Final   Culture NO GROWTH 5 DAYS  Final   Report Status 08/04/2016 FINAL  Final  MRSA PCR Screening     Status: None   Collection Time: 07/31/16 12:06 AM  Result Value Ref Range Status   MRSA by PCR NEGATIVE NEGATIVE Final    Comment:        The GeneXpert MRSA Assay (FDA approved for NASAL specimens only), is one component of a comprehensive MRSA colonization surveillance program. It is not intended to diagnose MRSA infection nor to guide or monitor treatment for MRSA infections.          Radiology Studies: Dg Chest Port 1 View  Result Date: 08/05/2016 CLINICAL DATA:  Confirm line placement. EXAM: PORTABLE CHEST 1 VIEW COMPARISON:  Chest x-ray dated 08/04/2016. FINDINGS: Cardiomegaly is stable. Right-sided PICC line in place with tip adequately positioned at the level of the mid/ upper SVC. Atherosclerotic changes noted at the aortic arch. No evidence of pulmonary edema or pneumonia. No pleural effusion or pneumothorax seen. IMPRESSION: 1. Right-sided PICC line appears adequately positioned with tip at the level of the mid/upper SVC. 2. Stable cardiomegaly. No evidence of active CHF or pulmonary edema. 3. No significant change compared to yesterday's chest x-ray. Improved aeration at the lung bases compared to the earlier chest x-ray of 08/01/2016. Electronically Signed   By: Franki Cabot M.D.   On: 08/05/2016 11:43        Scheduled Meds: . apixaban  5 mg Oral BID  . bisacodyl  5 mg Oral Daily  . ceFEPime (MAXIPIME) IV  1 g Intravenous Q24H  . diltiazem  360 mg Oral Daily  . ferrous sulfate  325 mg Oral Q breakfast  . furosemide  60 mg Oral  Daily  . insulin aspart  0-15 Units Subcutaneous TID WC  .  insulin aspart  0-5 Units Subcutaneous QHS  . ipratropium-albuterol  3 mL Nebulization TID  . levothyroxine  125 mcg Oral QAC breakfast  . lisinopril  5 mg Oral Daily  . metoprolol tartrate  50 mg Oral BID  . senna-docusate  1 tablet Oral BID  . sodium chloride flush  10-40 mL Intracatheter Q12H  . sodium chloride flush  10-40 mL Intracatheter Q12H  . sodium chloride flush  3 mL Intravenous Q12H   Continuous Infusions:   LOS: 6 days    Suhana Wilner Tanna Furry, MD [facility] Pager [phone]  If 7PM-7AM, please contact night-coverage [email or web address] Password [email or web address] 08/06/2016, 11:34 AM

## 2016-08-06 NOTE — Progress Notes (Signed)
Received a call from OT who stated pt voided and they provided peri care. Will cont to monitor pt.

## 2016-08-06 NOTE — Telephone Encounter (Signed)
-----   Message from Charlie Pitter, [place] sent at 08/06/2016  9:39 AM EST ----- Regarding: TOC appt FYI - this patient may be going home in the next day or so. I scheduled them for a f/u appt with Vin on 08/15/16. Will need a TOC phone call when she goes home. Thanks.  Dayna Dunn PA-C

## 2016-08-06 NOTE — Progress Notes (Signed)
Physical Therapy Treatment Patient Details Name: [name] MRN: [id number] DOB: Jun 25, 1920 Today's Date: 08/06/2016    History of Present Illness patient is a 83 y/o female with hx of HF, A-fib, ovarian ca, bladder cancer, sleep apnea, DM, breast ca presents with acute on chronic diastolic CHF and HCAP    PT Comments    Patient progressing well towards PT goals. Tolerated gait training today with min A for balance/safety. Pt with drop in Sp02 to mid-low 80s on RA but able to recover with cues for pursed lip breathing. Will continue to wean supplemental 02 as tolerated. Continues to require assist for standing from low surfaces due to weakness. Eager to return to [facility]. Will follow.  Follow Up Recommendations  SNF     Equipment Recommendations  None recommended by PT    Recommendations for Other Services       Precautions / Restrictions Precautions Precautions: Fall Precaution Comments: watch 02 Restrictions Weight Bearing Restrictions: No    Mobility  Bed Mobility               General bed mobility comments: Up in chair upon PT arrival.   Transfers Overall transfer level: Needs assistance Equipment used: Rolling walker (2 wheeled) Transfers: Sit to/from Stand Sit to Stand: Mod assist;+2 physical assistance;Min assist         General transfer comment: Cues for hand placement/technique. Stood from chair x3; Mod A with posterior lean upon standing progressing to Min A on last stand with posterior lean requring Mod A to prevent fall backwards.  Ambulation/Gait Ambulation/Gait assistance: Min assist Ambulation Distance (Feet): 12 Feet (+ 40' + 35') Assistive device: Rolling walker (2 wheeled) Gait Pattern/deviations: Step-through pattern;Decreased stride length;Trunk flexed;Decreased step length - right;Decreased step length - left Gait velocity: decreased   General Gait Details: Slow, unsteady gait with close chair follow. Cues for RW proximity. 3 seated rest  breaks. Sp02 ranged from 80-93% on RA. Cues for pursed lip breathing and a few standing rest breaks.    Stairs            Wheelchair Mobility    Modified Rankin (Stroke Patients Only)       Balance Overall balance assessment: Needs assistance Sitting-balance support: Feet supported Sitting balance-Leahy Scale: Poor     Standing balance support: During functional activity;Bilateral upper extremity supported Standing balance-Leahy Scale: Poor Standing balance comment: Requires UE support in standing.                    Cognition Arousal/Alertness: Awake/alert Behavior During Therapy: WFL for tasks assessed/performed Overall Cognitive Status: Within Functional Limits for tasks assessed                      Exercises      General Comments General comments (skin integrity, edema, etc.): Sp02 ranged from 80s-90s on RA.      Pertinent Vitals/Pain Pain Assessment: No/denies pain    Home Living                      Prior Function            PT Goals (current goals can now be found in the care plan section) Progress towards PT goals: Progressing toward goals    Frequency    Min 2X/week      PT Plan Current plan remains appropriate    Co-evaluation             End  of Session Equipment Utilized During Treatment: Gait belt;Oxygen Activity Tolerance: Patient tolerated treatment well Patient left: in chair;with call bell/phone within reach;with chair alarm set     Time: 1346-1413 PT Time Calculation (min) (ACUTE ONLY): 27 min  Charges:  $Gait Training: 8-22 mins $Therapeutic Activity: 8-22 mins                    G Codes:      [name] A [name] 08/06/2016, 3:29 PM Wray Kearns, [place], DPT [phone]

## 2016-08-06 NOTE — Care Management Important Message (Signed)
Important Message  Patient Details  Name: TEYA SPAGNOLI MRN: [id number] Date of Birth: 08-22-1919   Medicare Important Message Given:  Yes    Odette Watanabe 08/06/2016, 4:20 PM

## 2016-08-06 NOTE — Consult Note (Signed)
[facility] CM Primary Care Navigator  08/06/2016  [name] [birth year] [id number]  Met with patient and daughter [name]) at the bedside to identify possible discharge needs. Daughter reports difficulty breathing and swelling to bilateral lower extremities that led to this admission.  Patient's daughter endorses Dr. Hulan Fess with [facility] as the primary care provider.    Patient shared using [facility] service and [facility] to obtain medications without difficulty.   Daughter reports that patient currently resides at [facility] for about 2 years and staff (med techs) manages her medications there, as well as providing all care needs.  Daughter provides transportation to her doctors' appointments.  Discharge plan is skilled nursing facility for rehabilitation prior to going back to [facility] according to daughter (with preference for [facility] which is currently in process).   Daughter and patient voiced understanding to call primary care provider's office when she goes back to [facility] for a post discharge follow-up appointment within a week or sooner if needs arise. Patient letter (with PCP's contact number) was provided as a reminder.  Daughter denies any current health needs or concerns at this time since it is being managed at [facility] and her provider as stated.   Will notify M. Niemczura for post acute follow-up at the skilled nursing facility (possibly [facility]).    For additional questions please contact:  Edwena Felty A. Shawneen Deetz, BSN, RN-BC [facility] PRIMARY CARE Navigator Cell: [phone]

## 2016-08-06 NOTE — Progress Notes (Signed)
Pt transition to 2l [place]. 02 sat 93%. Pt denied feeling SOB. Will cont to monitor pt.

## 2016-08-06 NOTE — Progress Notes (Signed)
Updated pt's daughter [name], per pt's request. Will cont to monitor pt.

## 2016-08-06 NOTE — Progress Notes (Signed)
Patient had 2.2 seconds pause. Patient is asymptomatic. Will continue to monitor patient.

## 2016-08-07 LAB — GLUCOSE, CAPILLARY
GLUCOSE-CAPILLARY: 104 mg/dL — AB (ref 65–99)
Glucose-Capillary: 108 mg/dL — ABNORMAL HIGH (ref 65–99)
Glucose-Capillary: 97 mg/dL (ref 65–99)
Glucose-Capillary: 112 mg/dL — ABNORMAL HIGH (ref 65–99)

## 2016-08-07 MED ORDER — METOPROLOL TARTRATE 25 MG PO TABS: 25.0000 mg | ORAL_TABLET | Freq: Two times a day (BID) | ORAL | Status: DC

## 2016-08-07 MED ORDER — ALBUTEROL SULFATE (2.5 MG/3ML) 0.083% IN NEBU: 2.5000 mg | INHALATION_SOLUTION | RESPIRATORY_TRACT | 12 refills | Status: DC | PRN

## 2016-08-07 MED ORDER — METOPROLOL TARTRATE 50 MG PO TABS: 50.0000 mg | ORAL_TABLET | Freq: Two times a day (BID) | ORAL | Status: DC

## 2016-08-07 MED ORDER — INSULIN ASPART 100 UNIT/ML ~~LOC~~ SOLN: 0.0000 [IU] | Freq: Three times a day (TID) | SUBCUTANEOUS | 0 refills | Status: DC

## 2016-08-07 NOTE — Progress Notes (Signed)
Patient's heart rate down to 40's sustained.  Patient stating she does not feel well.  EKG obtained.  Dr. Carolin Sicks paged and notified.  Orders received to cancel discharge.  Primary nurse Dawn aware.  Sanda Linger

## 2016-08-07 NOTE — Progress Notes (Addendum)
The discharge plan and order done.  Later received call from the nurse regarding low HR to 40s.  I will reduce the dose of metoprolol to 25 bid with parameters to hold if HR <65 -will hold discharge today - monitor today - Informed cardiology team to re-evaluate the patient.  I called and discussed with patient's daughter [name] to update.

## 2016-08-07 NOTE — Progress Notes (Signed)
Progress Note  Patient Name: [name] Date of Encounter: 08/07/2016  Primary Cardiologist: Allred  Subjective   Patient was feeling well today and ready to go home. However, this afternoon she became weak and dizzy and was noted to have HR in the upper 30s/low 40s. She also reports a very stressful day making some decisions about disposition. She presently denies any symptoms. HR 47-52.  Inpatient Medications    Scheduled Meds: . apixaban  5 mg Oral BID  . bisacodyl  5 mg Oral Daily  . ceFEPime (MAXIPIME) IV  1 g Intravenous Q24H  . ferrous sulfate  325 mg Oral Q breakfast  . furosemide  60 mg Oral Daily  . insulin aspart  0-15 Units Subcutaneous TID WC  . insulin aspart  0-5 Units Subcutaneous QHS  . levothyroxine  125 mcg Oral QAC breakfast  . senna-docusate  1 tablet Oral BID  . sodium chloride flush  10-40 mL Intracatheter Q12H  . sodium chloride flush  10-40 mL Intracatheter Q12H  . sodium chloride flush  3 mL Intravenous Q12H   Continuous Infusions:  PRN Meds: acetaminophen **OR** acetaminophen, albuterol, HYDROcodone-acetaminophen, ondansetron **OR** ondansetron (ZOFRAN) IV, sodium chloride flush, sodium chloride flush, sodium phosphate   Vital Signs    Vitals:   08/07/16 0838 08/07/16 1111 08/07/16 1126 08/07/16 1300  BP: (!) 109/49 (!) 101/55 (!) 101/55 109/61  Pulse: 78 (!) 41  (!) 42  Resp:  (!) 28  19  Temp:    98 F (36.7 C)  TempSrc:    Oral  SpO2:  90%  94%  Weight:      Height:        Intake/Output Summary (Last 24 hours) at 08/07/16 1633 Last data filed at 08/07/16 1600  Gross per 24 hour  Intake             1093 ml  Output              400 ml  Net              693 ml   Filed Weights   08/04/16 0500 08/05/16 0500 08/06/16 0427  Weight: 210 lb 11.2 oz (95.6 kg) 210 lb 11.2 oz (95.6 kg) 210 lb (95.3 kg)    Telemetry    Atrial fibrillation with generally good ventricular rate control. Some nocturnal bradycardia- Personally  Reviewed  Physical Exam   GEN: Elderly female, orientated and in no acute distress.  HEENT: Normocephalic, atraumatic, sclera non-icteric. Neck: Mild JVD present. No bruits. Cardiac: Irregular rhythm, borderline bradycardic rate. no murmurs, rubs, or gallops.  Respiratory: Clear anteriorly bilaterally. Breathing is unlabored. GI: Soft, nontender. Mild distension, BS +x 4. MS: no deformity. Extremities: No clubbing or cyanosis. Chronic lower extremity edema present with wrapping present but overall seems smaller than prior assessment. Neuro:  AAOx3. Follows commands. Psych:  Responds to questions appropriately with a normal affect.  Labs    Chemistry  Recent Labs Lab 08/04/16 0420 08/05/16 0500 08/06/16 0448  NA 140 139 140  K 4.8 4.6 3.9  CL 96* 92* 97*  CO2 38* 35* 37*  GLUCOSE 102* 111* 97  BUN 17 25* 26*  CREATININE 0.77 0.80 0.80  CALCIUM 8.7* 9.2 8.5*  ALBUMIN 3.3* 3.5 3.1*  GFRNONAA >60 >60 >60  GFRAA >60 >60 >60  ANIONGAP 6 12 6      Hematology  Recent Labs Lab 08/02/16 0400 08/05/16 1130 08/06/16 0448  WBC 7.0 7.7 7.8  RBC 4.65 4.40  4.54  HGB 13.7 13.1 13.1  HCT 44.4 41.2 42.1  MCV 95.5 93.6 92.7  MCH 29.5 29.8 28.9  MCHC 30.9 31.8 31.1  RDW 15.2 14.7 14.9  PLT 124* 131* 138*    BNPNo results for input(s): BNP, PROBNP in the last 168 hours.   Radiology    No results found.  Cardiac Studies    07/20/2015 Echo - Left ventricle: The cavity size was mildly dilated. Wall thickness was increased in a pattern of mild LVH. Systolic function was normal. The estimated ejection fraction was in the range of 55% to 60%. Wall motion was normal; there were no regional wall motion abnormalities. - Aortic valve: Valve mobility was restricted. There was moderate stenosis. - Mitral valve: Calcified annulus. Mildly thickened leaflets . There was moderate regurgitation. - Left atrium: The atrium was severely dilated. - Right atrium: The atrium  was mildly dilated. - Pulmonary arteries: Systolic pressure was moderately increased. PA peak pressure: 61 mm Hg (S). - Pericardium, extracardiac: A small pericardial effusion was identified. Impressions: - Normal LV systolic function; biatrial enlargement; heavily calcified aortic valve with moderate AS by mean gradient; moderate MR; mild TR with moderately elevated pulmonary pressure; small pericardial effusion.   Patient Profile     83 y.o.femalewith permanent atrial fibrillation, moderate aortic stenosis, chronic diastolic heart failure, obstructive sleep apnea and moderate pulmonary hypertension, diabetes mellitus, hypothyroidism, presenting with acute exacerbation of chronic heart failure in the setting of health care acquired pneumonia.  Assessment & Plan    1. Acute on chronic diastolic CHF: clinically improved. Was ready for discharge today but had episode above. Given softer BP will hold lisinopril and Lasix pending further observation. Anticipate we may be able to resume these in AM (she was set to go home on Lasix 60mg  daily and lisinopril 5mg  daily. Lasix 60mg  daily was a higher dose than traditional home dose.).   2. Permanent AFib: She had RVR earlier this admission which was treated with addition of metoprolol. I suspect her bradycardia stems from the fact that her underlying driving illness has improved. Will d/c metoprolol. Will also hold diltiazem for now, pending re-assessment by cardiology in AM to clear for resumption based on telemetry.  3. HTN: soft BP. Have instructed nurse to follow closely this afternoon. See above regarding med adjustment.  4. Hypothyroidism: abnormal TSH. Further per IM.  5. HCAP: Per IM.  Will f/u telemetry in AM. TOC F/u on 08/15/16 at 3pm (sent message to triage for phone call).  Lisbeth Renshaw Dunn PA-C 08/07/2016 4:38 PM   I have personally seen and examined this patient with Melina Copa, PA-C. I agree with the assessment and plan  as outlined above. [name] was doing well but had bradycardia this afternoon with weakness. Metoprolol held. Cardizem held. BP is ok. HR is better. Would monitor here tonight. Likely resume home dose of diltiazem tomorrow but not restart beta blocker.   Lauree Chandler 08/07/2016 5:41 PM

## 2016-08-07 NOTE — Discharge Summary (Addendum)
Physician Discharge Summary  [name] [id number] DOB: [birth year] DOA: 07/30/2016  PCP: [name], MD  Admit date: 07/30/2016 Discharge date: 08/07/2016  Admitted From:SNF Disposition:SNF  Recommendations for Outpatient Follow-up:  1. Follow up with PCP in 1-2 weeks 2. Please obtain BMP/CBC in one week  Home Health:SNF Equipment/Devices:oxygen Discharge Condition:stable CODE STATUS:full Diet recommendation:heart healthy  Brief/Interim Summary:83 y.o.femalewith medical history significant diastolic heart failure, A. fib, obstructive sleep apnea on C Pap, 16 bowel obstructions in the last 5 years, ovarian cancer, bladder cancer, hypertension, hypothyroidism venous insufficiency presents to the emergency department from the facility with chief complaint sudden worsening shortness of breath and gradual worsening lower extremity edema. So evaluation yields acute respiratory failure hypoxia likely related to healthcare associated pneumonia in the setting of mild CHF exacerbation.  # Acute hypoxic respiratory failure likely in the setting of healthcare associated pneumonia and acute on chronic diastolic congestive heart failure: -CT chest with cardiomegaly and moderate pericardial effusion. Also has bilateral pleural effusion. -Patient Was treated with IV Lasix with significant clinical improvement. Patient is net negative by almost 9.5 L. She feels completely better. Able to wean down oxygen requirement to 3 L today. Plan to discharge patient to SNF with 3 L of oxygen with outpatient follow-up. Patient was evaluated by cardiologist in the hospital.  -Completed course of antibiotics today.  #Healthcare associated pneumonia: Clinically improved. Completed antibiotics course in the hospital. Influenza negative.  # Acute on chronic congestive heart failure with preserved EF: Patient had an echocardiogram about 2 weeks ago which was consistent with EF of 55-60%. -Currently on  oral Lasix. Education provided to take low-salt diet and daily weight. Outpatient follow-up with the cardiologist. -Mild elevation in troponin likely due to congestive heart failure. -Continue potassium chloride, lisinopril, metoprolol.  #Hypokalemia in the setting of diuretics: Potassium acceptable. Monitor BMP.  #Atrial fibrillation, paroxysmal: Continue diltiazem, metoprolol. eliquis for systemic anticoagulation  #Lateral lower extremity edema: Probably exacerbated by CHF. On Lasix.  -Unna boot, wound care -Edema improved significantly  #Diabetes: Continue sliding scale. Monitor blood sugar level.  #hypertension: Monitor blood pressure. Continue current medications  #History of bowel obstruction: Continue with the bowel regimen. Watch for bowel movement closely.  #Pressure injury of the skin around sacrum, unspecified: Patient also with lower extremities chronic dermatitis and edema. Wound care consult appreciated. Ordered a dose of Diflucan. Encourage more activities and out of bed.  #Goals of care discussion: She is DO NOT RESUSCITATE.  Patient with significant clinical improvement. Reported that her shortness of breath and cough has improved. No chest pain. Able to wean down oxygen requirement to 3 L today. I discussed with the social worker today regarding discharge planning. Also discussed with the nurse to remove PICC line before discharge. At this time patient is medically stable to discharge/transfer her care to outpatient. Patient will be followed up by cardiologist and PCP as an outpatient.  Discharge Diagnoses:  Principal Problem:   Acute respiratory failure with hypoxia (HCC) Active Problems:   Acute on chronic combined systolic and diastolic CHF (congestive heart failure) (HCC)   Permanent atrial fibrillation (HCC)   Hypokalemia   Diabetes mellitus (HCC)   Hypothyroidism   Venous insufficiency   Hyperlipidemia   PNA (pneumonia)   Essential hypertension    Bowel obstruction   HCAP (healthcare-associated pneumonia)   Bilateral leg edema   Diabetes mellitus with complication (HCC)   Goals of care, counseling/discussion   Pressure injury of skin    Discharge Instructions  Discharge  Instructions    (HEART FAILURE PATIENTS) Call MD:  Anytime you have any of the following symptoms: 1) 3 pound weight gain in 24 hours or 5 pounds in 1 week 2) shortness of breath, with or without a dry hacking cough 3) swelling in the hands, feet or stomach 4) if you have to sleep on extra pillows at night in order to breathe.    Complete by:  As directed    Call MD for:  difficulty breathing, headache or visual disturbances    Complete by:  As directed    Call MD for:  extreme fatigue    Complete by:  As directed    Call MD for:  hives    Complete by:  As directed    Call MD for:  persistant dizziness or light-headedness    Complete by:  As directed    Call MD for:  persistant nausea and vomiting    Complete by:  As directed    Call MD for:  temperature >100.4    Complete by:  As directed    Diet - low sodium heart healthy    Complete by:  As directed    Discharge instructions    Complete by:  As directed    F/u PCP and cardiologist   Increase activity slowly    Complete by:  As directed      Allergies as of 08/07/2016      Reactions   Morphine And Related Other (See Comments)   sick   Statins Other (See Comments)   sick   Zetia [ezetimibe] Other (See Comments)   Side effect too strong    Codeine Other (See Comments)      Medication List    STOP taking these medications   amoxicillin-clavulanate 875-125 MG tablet Commonly known as:  AUGMENTIN     TAKE these medications   albuterol (2.5 MG/3ML) 0.083% nebulizer solution Commonly known as:  PROVENTIL Take 3 mLs (2.5 mg total) by nebulization every 4 (four) hours as needed for wheezing or shortness of breath.   apixaban 5 MG Tabs tablet Commonly known as:  ELIQUIS Take 1 tablet (5 mg  total) by mouth 2 (two) times daily.   bisacodyl 10 MG suppository Commonly known as:  DULCOLAX Place 10 mg rectally as needed for moderate constipation.   bisacodyl 5 MG EC tablet Commonly known as:  DULCOLAX Take 5 mg by mouth daily.   clobetasol cream 0.05 % Commonly known as:  TEMOVATE Apply 1 application topically daily as needed (rash).   diltiazem 360 MG 24 hr capsule Commonly known as:  TIAZAC Take 360 mg by mouth daily.   ferrous sulfate 325 (65 FE) MG tablet Take 325 mg by mouth daily with breakfast.   furosemide 40 MG tablet Commonly known as:  LASIX Take 60 mg by mouth daily.   insulin aspart 100 UNIT/ML injection Commonly known as:  novoLOG Inject 0-15 Units into the skin 3 (three) times daily with meals.   levothyroxine 125 MCG tablet Commonly known as:  SYNTHROID, LEVOTHROID Take 125 mcg by mouth daily before breakfast.   lisinopril 5 MG tablet Commonly known as:  PRINIVIL,ZESTRIL Take 5 mg by mouth daily.   metoprolol tartrate 25 MG tablet Commonly known as:  LOPRESSOR Take 1 tablet (25 mg total) by mouth 2 (two) times daily. Hold if HR <65   potassium chloride SA 20 MEQ tablet Commonly known as:  K-DUR,KLOR-CON Take 20 mEq by mouth daily.   senna-docusate 8.6-50  MG tablet Commonly known as:  Senokot-S Take 1 tablet by mouth 2 (two) times daily.   sodium phosphate 7-19 GM/118ML Enem Place 133 mLs (1 enema total) rectally daily as needed for severe constipation.            Durable Medical Equipment        Start     Ordered   08/07/16 1152  DME Oxygen  Once    Question Answer Comment  Mode or (Route) Nasal cannula   Liters per Minute 3   Frequency Continuous (stationary and portable oxygen unit needed)   Oxygen conserving device Yes   Oxygen delivery system Gas      08/07/16 1152      Contact information for follow-up providers    Bhagat,Bhavinkumar, PA Follow up.   Specialty:  Cardiology Why:  Follow-up appointment on 08/15/16  at 3pm. Vin is one of the PAs that works with Dr. Rayann Heman. Contact information: [address] [place] [address] [phone]        [name], MD. Schedule an appointment as soon as possible for a visit in 1 week(s).   Specialty:  Family Medicine Contact information: [place] [address] [phone]            Contact information for after-discharge care    Destination    [facility] Follow up.   Specialty:  [place] information: [place] [phone]                 Allergies  Allergen Reactions  . Morphine And Related Other (See Comments)    sick  . Statins Other (See Comments)    sick  . Zetia [Ezetimibe] Other (See Comments)    Side effect too strong   . Codeine Other (See Comments)    Consultations: Cardiologist  Procedures/Studies: None  Subjective: Patient was seen and examined at the bedside. Reported feeling much better. Sitting on chair. Denied chest pain, shortness of breath, nausea vomiting or abdominal pain. No headache or dizziness.   Discharge Exam: Vitals:   08/07/16 1111 08/07/16 1126  BP: (!) 101/55 (!) 101/55  Pulse: (!) 41   Resp: (!) 28   Temp:     Vitals:   08/07/16 0837 08/07/16 0838 08/07/16 1111 08/07/16 1126  BP: (!) 109/49 (!) 109/49 (!) 101/55 (!) 101/55  Pulse:  78 (!) 41   Resp:   (!) 28   Temp:      TempSrc:      SpO2:   90%   Weight:      Height:        General: Pt is alert, awake, not in acute distress Cardiovascular: RRR, S1/S2 +, no rubs, no gallops Respiratory: CTA bilaterally, no wheezing, no rhonchi Abdominal: Soft, NT, ND, bowel sounds + Extremities: no edema, no cyanosis Neurology: Alert, awake, following commands.   The results of significant diagnostics from this hospitalization (including imaging, microbiology, ancillary and laboratory) are listed below for  reference.     Microbiology: Recent Results (from the past 240 hour(s))  Blood Culture (routine x 2)     Status: None   Collection Time: 07/30/16  3:20 PM  Result Value Ref Range Status   Specimen Description BLOOD RIGHT ARM  Final   Special Requests BOTTLES DRAWN AEROBIC AND ANAEROBIC 5CC  Final   Culture NO GROWTH 5 DAYS  Final   Report Status 08/04/2016 FINAL  Final  Blood Culture (routine x 2)     Status: None   Collection Time: 07/30/16  3:50 PM  Result Value Ref Range Status   Specimen Description BLOOD RIGHT HAND  Final   Special Requests BOTTLES DRAWN AEROBIC AND ANAEROBIC 5CC  Final   Culture NO GROWTH 5 DAYS  Final   Report Status 08/04/2016 FINAL  Final  MRSA PCR Screening     Status: None   Collection Time: 07/31/16 12:06 AM  Result Value Ref Range Status   MRSA by PCR NEGATIVE NEGATIVE Final    Comment:        The GeneXpert MRSA Assay (FDA approved for NASAL specimens only), is one component of a comprehensive MRSA colonization surveillance program. It is not intended to diagnose MRSA infection nor to guide or monitor treatment for MRSA infections.      Labs: BNP (last 3 results)  Recent Labs  07/30/16 1512  BNP 123456*   Basic Metabolic Panel:  Recent Labs Lab 08/02/16 0400 08/02/16 0416 08/03/16 0515 08/04/16 0420 08/05/16 0500 08/06/16 0448  NA  --  141 141 140 139 140  K  --  3.3* 4.3 4.8 4.6 3.9  CL  --  93* 94* 96* 92* 97*  CO2  --  39* 38* 38* 35* 37*  GLUCOSE  --  102* 108* 102* 111* 97  BUN  --  15 18 17  25* 26*  CREATININE  --  0.70 0.71 0.77 0.80 0.80  CALCIUM  --  8.1* 8.5* 8.7* 9.2 8.5*  MG 1.8  --  2.0  --  2.2  --   PHOS  --  3.1 3.0 3.0 3.1 3.9   Liver Function Tests:  Recent Labs Lab 08/02/16 0416 08/03/16 0515 08/04/16 0420 08/05/16 0500 08/06/16 0448  ALBUMIN 3.3* 3.4* 3.3* 3.5 3.1*   No results for input(s): LIPASE, AMYLASE in the last 168 hours. No results for input(s): AMMONIA in the last 168  hours. CBC:  Recent Labs Lab 08/01/16 0642 08/02/16 0400 08/05/16 1130 08/06/16 0448  WBC 8.9 7.0 7.7 7.8  HGB 14.3 13.7 13.1 13.1  HCT 46.6* 44.4 41.2 42.1  MCV 97.9 95.5 93.6 92.7  PLT 130* 124* 131* 138*   Cardiac Enzymes: No results for input(s): CKTOTAL, CKMB, CKMBINDEX, TROPONINI in the last 168 hours. BNP: Invalid input(s): POCBNP CBG:  Recent Labs Lab 08/06/16 1124 08/06/16 1627 08/06/16 2049 08/07/16 0738 08/07/16 1154  GLUCAP 97 119* 127* 108* 97   D-Dimer No results for input(s): DDIMER in the last 72 hours. Hgb A1c No results for input(s): HGBA1C in the last 72 hours. Lipid Profile No results for input(s): CHOL, HDL, LDLCALC, TRIG, CHOLHDL, LDLDIRECT in the last 72 hours. Thyroid function studies  Recent Labs  08/06/16 1830  TSH 9.573*   Anemia work up No results for input(s): VITAMINB12, FOLATE, FERRITIN, TIBC, IRON, RETICCTPCT in the last 72 hours. Urinalysis    Component Value Date/Time   COLORURINE AMBER (A) 07/19/2015 1359   APPEARANCEUR TURBID (A) 07/19/2015 1359   LABSPEC >1.046 (H) 07/19/2015 1359   PHURINE 6.5 07/19/2015 1359   GLUCOSEU NEGATIVE 07/19/2015 1359   HGBUR MODERATE (A) 07/19/2015 1359   BILIRUBINUR NEGATIVE 07/19/2015 1359   KETONESUR NEGATIVE 07/19/2015 1359   PROTEINUR 100 (A) 07/19/2015 1359   UROBILINOGEN 0.2 08/25/2014 1246   NITRITE NEGATIVE 07/19/2015 1359   LEUKOCYTESUR LARGE (A) 07/19/2015 1359   Sepsis Labs Invalid input(s): PROCALCITONIN,  WBC,  LACTICIDVEN Microbiology Recent Results (from the past  240 hour(s))  Blood Culture (routine x 2)     Status: None   Collection Time: 07/30/16  3:20 PM  Result Value Ref Range Status   Specimen Description BLOOD RIGHT ARM  Final   Special Requests BOTTLES DRAWN AEROBIC AND ANAEROBIC 5CC  Final   Culture NO GROWTH 5 DAYS  Final   Report Status 08/04/2016 FINAL  Final  Blood Culture (routine x 2)     Status: None   Collection Time: 07/30/16  3:50 PM  Result  Value Ref Range Status   Specimen Description BLOOD RIGHT HAND  Final   Special Requests BOTTLES DRAWN AEROBIC AND ANAEROBIC 5CC  Final   Culture NO GROWTH 5 DAYS  Final   Report Status 08/04/2016 FINAL  Final  MRSA PCR Screening     Status: None   Collection Time: 07/31/16 12:06 AM  Result Value Ref Range Status   MRSA by PCR NEGATIVE NEGATIVE Final    Comment:        The GeneXpert MRSA Assay (FDA approved for NASAL specimens only), is one component of a comprehensive MRSA colonization surveillance program. It is not intended to diagnose MRSA infection nor to guide or monitor treatment for MRSA infections.      Time coordinating discharge: 33 minutes  SIGNED:   Rosita Fire, MD  [facility] 08/07/2016, 2:22 PM  If 7PM-7AM, please contact night-coverage [email or web address] Password [email or web address]

## 2016-08-07 NOTE — Progress Notes (Signed)
SATURATION QUALIFICATIONS: (This note is used to comply with regulatory documentation for home oxygen)  Patient Saturations on Room Air at Rest = 88 %  Patient Saturations on Room Air while Ambulating = 83 %  Patient Saturations on 3 Liters of oxygen while Ambulating = 91-92 %  Please briefly explain why patient needs home oxygen:patinnts O2 sats drop with excertion

## 2016-08-07 NOTE — Progress Notes (Signed)
NP on call notified that pt had a 2.15s pause. Pt was asymptomatic & stable. Will continue to monitor the pt. Hoover Brunette, RN

## 2016-08-07 NOTE — Care Management Note (Addendum)
Case Management Note  Patient Details  Name: [name] MRN: [id number] Date of Birth: [birth year]  Subjective/Objective:   Pt presented for Acute Respiratory Failure. Plan will be for SNF placement. CSW assisting with disposition needs.                  Action/Plan: Pt for d/c today to [facility] No further needs from CM at this time.   Expected Discharge Date:  08/07/16               Expected Discharge Plan:  [place]  In-House Referral:  Clinical Social Work  Discharge planning Services  CM Consult  Post Acute Care Choice:  NA Choice offered to:  NA  DME Arranged:  N/A DME Agency:  NA  HH Arranged:  NA HH Agency:  NA  Status of Service:  Completed, signed off  If discussed at [employer] of Stay Meetings, dates discussed:  08-07-16  Additional Comments: [id number] 08-07-16 Jacqlyn Krauss, RN,BSN [phone] Discharge cancelled due to patient's HR in the 40's. Will continue to monitor.  Bethena Roys, RN 08/07/2016, 12:05 PM

## 2016-08-08 DIAGNOSIS — R600 Localized edema: Secondary | ICD-10-CM | POA: Diagnosis not present

## 2016-08-08 DIAGNOSIS — N183 Chronic kidney disease, stage 3 (moderate): Secondary | ICD-10-CM | POA: Diagnosis not present

## 2016-08-08 DIAGNOSIS — R2689 Other abnormalities of gait and mobility: Secondary | ICD-10-CM | POA: Diagnosis not present

## 2016-08-08 DIAGNOSIS — R001 Bradycardia, unspecified: Secondary | ICD-10-CM

## 2016-08-08 DIAGNOSIS — I509 Heart failure, unspecified: Secondary | ICD-10-CM | POA: Diagnosis not present

## 2016-08-08 DIAGNOSIS — B372 Candidiasis of skin and nail: Secondary | ICD-10-CM | POA: Diagnosis not present

## 2016-08-08 DIAGNOSIS — Z79899 Other long term (current) drug therapy: Secondary | ICD-10-CM | POA: Diagnosis not present

## 2016-08-08 DIAGNOSIS — N189 Chronic kidney disease, unspecified: Secondary | ICD-10-CM | POA: Diagnosis not present

## 2016-08-08 DIAGNOSIS — I10 Essential (primary) hypertension: Secondary | ICD-10-CM | POA: Diagnosis not present

## 2016-08-08 DIAGNOSIS — R278 Other lack of coordination: Secondary | ICD-10-CM | POA: Diagnosis not present

## 2016-08-08 DIAGNOSIS — I4891 Unspecified atrial fibrillation: Secondary | ICD-10-CM | POA: Diagnosis not present

## 2016-08-08 DIAGNOSIS — E785 Hyperlipidemia, unspecified: Secondary | ICD-10-CM | POA: Diagnosis not present

## 2016-08-08 DIAGNOSIS — I5043 Acute on chronic combined systolic (congestive) and diastolic (congestive) heart failure: Secondary | ICD-10-CM | POA: Diagnosis not present

## 2016-08-08 DIAGNOSIS — E118 Type 2 diabetes mellitus with unspecified complications: Secondary | ICD-10-CM | POA: Diagnosis not present

## 2016-08-08 DIAGNOSIS — I482 Chronic atrial fibrillation: Secondary | ICD-10-CM | POA: Diagnosis not present

## 2016-08-08 DIAGNOSIS — J9601 Acute respiratory failure with hypoxia: Secondary | ICD-10-CM | POA: Diagnosis not present

## 2016-08-08 DIAGNOSIS — R531 Weakness: Secondary | ICD-10-CM | POA: Diagnosis not present

## 2016-08-08 DIAGNOSIS — E039 Hypothyroidism, unspecified: Secondary | ICD-10-CM | POA: Diagnosis not present

## 2016-08-08 DIAGNOSIS — I5032 Chronic diastolic (congestive) heart failure: Secondary | ICD-10-CM | POA: Diagnosis not present

## 2016-08-08 DIAGNOSIS — E119 Type 2 diabetes mellitus without complications: Secondary | ICD-10-CM | POA: Diagnosis not present

## 2016-08-08 DIAGNOSIS — R41841 Cognitive communication deficit: Secondary | ICD-10-CM | POA: Diagnosis not present

## 2016-08-08 DIAGNOSIS — Z7901 Long term (current) use of anticoagulants: Secondary | ICD-10-CM | POA: Diagnosis not present

## 2016-08-08 DIAGNOSIS — I872 Venous insufficiency (chronic) (peripheral): Secondary | ICD-10-CM | POA: Diagnosis not present

## 2016-08-08 DIAGNOSIS — J189 Pneumonia, unspecified organism: Secondary | ICD-10-CM | POA: Diagnosis not present

## 2016-08-08 LAB — GLUCOSE, CAPILLARY
GLUCOSE-CAPILLARY: 100 mg/dL — AB (ref 65–99)
Glucose-Capillary: 101 mg/dL — ABNORMAL HIGH (ref 65–99)
Glucose-Capillary: 93 mg/dL (ref 65–99)

## 2016-08-08 MED ORDER — FUROSEMIDE 40 MG PO TABS: 40.0000 mg | ORAL_TABLET | Freq: Two times a day (BID) | ORAL | 0 refills | Status: DC

## 2016-08-08 MED ORDER — DILTIAZEM HCL ER COATED BEADS 120 MG PO CP24
120.0000 mg | ORAL_CAPSULE | Freq: Every day | ORAL | Status: DC
  Administered 2016-08-08: 120 mg via ORAL
  Filled 2016-08-08: qty 1

## 2016-08-08 MED ORDER — FUROSEMIDE 40 MG PO TABS: 60.0000 mg | ORAL_TABLET | Freq: Every day | ORAL | 0 refills | Status: DC

## 2016-08-08 MED ORDER — DILTIAZEM HCL ER COATED BEADS 120 MG PO CP24: 120.0000 mg | ORAL_CAPSULE | Freq: Every day | ORAL | 2 refills | Status: DC

## 2016-08-08 NOTE — Progress Notes (Signed)
Instructed pt on procedure. HOB less than 45 degrees. Pt held breath upon line removal. Pressure held for 23min, no s/sx of bleeding, pressure drsg applied and pt was instructed to leave drsg CDI for 24 hours. Also instructed to report any s/sx of bleeding, and to remain in bed for 30 min. Pt VU also notified floor RN. Fran Lowes, RN VAST

## 2016-08-08 NOTE — Clinical Social Work Placement (Signed)
   CLINICAL SOCIAL WORK PLACEMENT  NOTE  Date:  08/08/2016  Patient Details  Name: [name] MRN: [id number] Date of Birth: April 01, 1920  Clinical Social Work is seeking post-discharge placement for this patient at the   level of care (*CSW will initial, date and re-position this form in  chart as items are completed):  Yes   Patient/family provided with [facility] Work Department's list of facilities offering this level of care within the geographic area requested by the patient (or if unable, by the patient's family).  Yes   Patient/family informed of their freedom to choose among providers that offer the needed level of care, that participate in Medicare, Medicaid or managed care program needed by the patient, have an available bed and are willing to accept the patient.  Yes   Patient/family informed of [facility]'s ownership interest in [facility] and [facility], as well as of the fact that they are under no obligation to receive care at these facilities.  PASRR submitted to EDS on       PASRR number received on       Existing PASRR number confirmed on 08/02/16     FL2 transmitted to all facilities in geographic area requested by pt/family on 08/03/16     FL2 transmitted to all facilities within larger geographic area on       Patient informed that his/her managed care company has contracts with or will negotiate with certain facilities, including the following:        Yes   Patient/family informed of bed offers received.  Patient chooses bed at [facility]     Physician recommends and patient chooses bed at      Patient to be transferred to [facility] on 08/08/16.  Patient to be transferred to facility by [facility]     Patient family notified on 08/08/16 of transfer.  Name of family member notified:  Pt's daughter, [name]     PHYSICIAN Please sign FL2, Please sign DNR     Additional Comment:     _______________________________________________ Darden Dates, LCSW 08/08/2016, 3:33 PM

## 2016-08-08 NOTE — Clinical Social Work Note (Signed)
Pt is ready for discharge today and will go to [facility]. Facility is ready to admit pt as they have received discharge information and family completed paperwork. CSW spoke with pt's daughter to update her on discharge. Pt and daughter are aware and agreeable to discharge plan. RN will call report. [facility] will provide transportation. CSW is signing off as no further needs identified.   Darden Dates, MSW, LCSW  Clinical Social Worker  [phone]

## 2016-08-08 NOTE — Progress Notes (Signed)
Attempted to call report to RN at [facility], operator paged Nurse with no response. I left my name and contact number for nurse to call to get report.

## 2016-08-08 NOTE — Discharge Summary (Signed)
Physician Discharge Summary  [name] [id number] DOB: Nov 25, 1919 DOA: 07/30/2016  PCP: [name] Pac, MD  Admit date: 07/30/2016 Discharge date: 08/08/2016    For the rest of the hospitalization please refer to prior discharge summary dictated by Dr.Bhandari of 08/07/2016.  Time spent: 40 minutes  Recommendations for Outpatient Follow-up:  1. Follow-up with Leanor Kail, [place] [facility] 08/15/2016 at 3 PM. Patient will need a basic metabolic profile done on follow-up to follow-up on electrolytes and renal function. 2. Follow-up with M.D. at skilled nursing facility. 3. Follow-up with [name] Pac, MD in 1 week. Patient will need repeat thyroid function studies done in about 4-6 weeks to follow-up on TFTs as patient was noted to have a elevated TSH and a normal free T4 likely euthyroid sick syndrome.   Discharge Diagnoses:  Principal Problem:   Acute respiratory failure with hypoxia (HCC) Active Problems:   Bradycardia   Acute on chronic combined systolic and diastolic CHF (congestive heart failure) (HCC)   Permanent atrial fibrillation (HCC)   Hypokalemia   Diabetes mellitus (HCC)   Hypothyroidism   Venous insufficiency   Hyperlipidemia   PNA (pneumonia)   Essential hypertension   Bowel obstruction   HCAP (healthcare-associated pneumonia)   Bilateral leg edema   Diabetes mellitus with complication (HCC)   Goals of care, counseling/discussion   Pressure injury of skin   Discharge Condition: Stable and improved.  Diet recommendation: Heart healthy.  Filed Weights   08/05/16 0500 08/06/16 0427 08/08/16 0409  Weight: 95.6 kg (210 lb 11.2 oz) 95.3 kg (210 lb) 96.8 kg (213 lb 6.5 oz)    History of present illness:  See prior d/c summary dictated by [name] Course:  Patient was to be discharged on 08/07/2016 however when ready to be discharged patient became weak and dizzy and noted to have heart rate in the high 30s to low 40s and a  such discharge was discontinued and cardiology consulted.  #1 Bradycardia/permanent atrial fibrillation Patient noted to be bradycardic just prior to discharge and discharge was discontinued cardiology consulted. It was noted that metoprolol had been added to patient's regimen for her permanent A. fib and was felt patient's bradycardia stemmed from an underlying driving illness as well as addition of metoprolol. As patient's illness improved bradycardia worsened. Metoprolol was subsequently discontinued patient's Cardizem held as well as her lisinopril as patient's blood pressure was soft. Patient was followed by cardiology and patient improved clinically. Patient did state on day of discharge that she had an excellent night of rest and was ready for discharge. Cardiology recommended resuming patient back on Cardizem CD 120 mg daily which is a lower dose on her home regimen and not to resume patient's beta blocker. It was recommended that patient be discharged on Lasix 40 mg twice daily 1 week and then 60 mg daily after that. Outpatient cardiology follow-up visit has been scheduled for patient on 08/15/2016 at which point in time further recommendations on her cardiac medications will be made.  For the rest of the hospitalization please see prior discharge summary dictated by [name] 08/07/2016.    Procedures:  CT chest 07/31/2016 Chest x-ray 07/30/2016, 08/01/2016, 08/04/2016, 08/05/2016 Picc line Consultations:  Cardiology: Dr [name] 08/01/2016  [place]  Discharge Exam: Vitals:   08/08/16 1349 08/08/16 1519  BP: 111/61   Pulse:    Resp:    Temp:  98.1 F (36.7 C)    General: NAD Cardiovascular: RRR Respiratory: IRREGULARLY IRREGULAR, 2/6 Holosystoic murmur.  Discharge Instructions  Discharge Instructions    (HEART FAILURE PATIENTS) Call MD:  Anytime you have any of the following symptoms: 1) 3 pound weight gain in 24 hours or 5 pounds in 1 week 2) shortness of breath, with  or without a dry hacking cough 3) swelling in the hands, feet or stomach 4) if you have to sleep on extra pillows at night in order to breathe.    Complete by:  As directed    Call MD for:  difficulty breathing, headache or visual disturbances    Complete by:  As directed    Call MD for:  extreme fatigue    Complete by:  As directed    Call MD for:  hives    Complete by:  As directed    Call MD for:  persistant dizziness or light-headedness    Complete by:  As directed    Call MD for:  persistant nausea and vomiting    Complete by:  As directed    Call MD for:  temperature >100.4    Complete by:  As directed    Diet - low sodium heart healthy    Complete by:  As directed    Discharge instructions    Complete by:  As directed    F/u PCP and cardiologist   Increase activity slowly    Complete by:  As directed      Current Discharge Medication List    START taking these medications   Details  albuterol (PROVENTIL) (2.5 MG/3ML) 0.083% nebulizer solution Take 3 mLs (2.5 mg total) by nebulization every 4 (four) hours as needed for wheezing or shortness of breath. Qty: 75 mL, Refills: 12    diltiazem (CARDIZEM CD) 120 MG 24 hr capsule Take 1 capsule (120 mg total) by mouth daily. Qty: 30 capsule, Refills: 2    insulin aspart (NOVOLOG) 100 UNIT/ML injection Inject 0-15 Units into the skin 3 (three) times daily with meals. Qty: 10 mL, Refills: 0      CONTINUE these medications which have CHANGED   Details  !! furosemide (LASIX) 40 MG tablet Take 1 tablet (40 mg total) by mouth 2 (two) times daily. Take lasix 40 mg BID x 1 week. Qty: 14 tablet, Refills: 0    !! furosemide (LASIX) 40 MG tablet Take 1.5 tablets (60 mg total) by mouth daily. Take 1.5 tablets(60mg ) daily starting 08/17/2015. Qty: 60 tablet, Refills: 0     !! - Potential duplicate medications found. Please discuss with provider.    CONTINUE these medications which have NOT CHANGED   Details  apixaban (ELIQUIS) 5 MG  TABS tablet Take 1 tablet (5 mg total) by mouth 2 (two) times daily. Qty: 180 tablet, Refills: 3   Associated Diagnoses: Atrial fibrillation, unspecified    bisacodyl (DULCOLAX) 10 MG suppository Place 10 mg rectally as needed for moderate constipation.    bisacodyl (DULCOLAX) 5 MG EC tablet Take 5 mg by mouth daily.     clobetasol cream (TEMOVATE) [id number] % Apply 1 application topically daily as needed (rash).    ferrous sulfate 325 (65 FE) MG tablet Take 325 mg by mouth daily with breakfast.    levothyroxine (SYNTHROID, LEVOTHROID) 125 MCG tablet Take 125 mcg by mouth daily before breakfast.    potassium chloride SA (K-DUR,KLOR-CON) 20 MEQ tablet Take 20 mEq by mouth daily.    senna-docusate (SENOKOT-S) 8.6-50 MG per tablet Take 1 tablet by mouth 2 (two) times daily.    sodium phosphate (FLEET) 7-19 GM/118ML ENEM Place  133 mLs (1 enema total) rectally daily as needed for severe constipation. Refills: 0      STOP taking these medications     amoxicillin-clavulanate (AUGMENTIN) 875-125 MG tablet      diltiazem (TIAZAC) 360 MG 24 hr capsule      lisinopril (PRINIVIL,ZESTRIL) 5 MG tablet        Allergies  Allergen Reactions  . Morphine And Related Other (See Comments)    sick  . Statins Other (See Comments)    sick  . Zetia [Ezetimibe] Other (See Comments)    Side effect too strong   . Codeine Other (See Comments)    Contact information for follow-up providers    Bhagat,Bhavinkumar, PA Follow up.   Specialty:  Cardiology Why:  Follow-up appointment on 08/15/16 at 3pm. Vin is one of the PAs that works with Dr. Rayann Heman. Contact information: [address] [place] [address] [phone]        [name] Pac, MD. Schedule an appointment as soon as possible for a visit in 1 week(s).   Specialty:  Family Medicine Contact information: [place] [address] [phone]            Contact information for after-discharge care     Destination    [facility] Follow up.   Specialty:  [place] information: [address] [place] [phone]                   The results of significant diagnostics from this hospitalization (including imaging, microbiology, ancillary and laboratory) are listed below for reference.    Significant Diagnostic Studies: Ct Chest Wo Contrast  Result Date: 07/31/2016 CLINICAL DATA:  Cough, RIGHT lung atelectasis EXAM: CT CHEST WITHOUT CONTRAST TECHNIQUE: Multidetector CT imaging of the chest was performed following the standard protocol without IV contrast. Sagittal and coronal MPR images reconstructed from axial data set. COMPARISON:  07/19/2015; chest radiograph 07/30/2016 FINDINGS: Cardiovascular: Atherosclerotic calcifications aorta, proximal great vessels and coronary arteries. Moderate pericardial effusion. Enlargement of cardiac chambers. Pulmonary vascular congestion and dilatation of central pulmonary arteries. Normal caliber thoracic aorta. Mediastinum/Nodes: Unremarkable esophagus. No thoracic adenopathy, with scattered normal sized mediastinal lymph nodes noted. Base of cervical region unremarkable. Lungs/Pleura: Small BILATERAL pleural effusions larger on RIGHT. Peribronchial thickening. Scattered atelectasis in BILATERAL lower lobes. No acute infiltrate or pneumothorax. Upper Abdomen: Cirrhotic liver.  Otherwise unremarkable. Musculoskeletal: Scattered degenerative changes thoracic spine. Several bone islands lower thoracic spine stable. Diffuse osseous demineralization. IMPRESSION: Enlargement of cardiac chambers with moderate pericardial effusion. Suspect pulmonary arterial hypertension. Aortic atherosclerosis and coronary arterial calcifications. BILATERAL pleural effusions with scattered atelectasis in the lower lobes bilaterally. Cirrhotic liver. Electronically Signed   By: Lavonia Dana M.D.   On:  07/31/2016 17:59   Dg Chest Port 1 View  Result Date: 08/05/2016 CLINICAL DATA:  Confirm line placement. EXAM: PORTABLE CHEST 1 VIEW COMPARISON:  Chest x-ray dated 08/04/2016. FINDINGS: Cardiomegaly is stable. Right-sided PICC line in place with tip adequately positioned at the level of the mid/ upper SVC. Atherosclerotic changes noted at the aortic arch. No evidence of pulmonary edema or pneumonia. No pleural effusion or pneumothorax seen. IMPRESSION: 1. Right-sided PICC line appears adequately positioned with tip at the level of the mid/upper SVC. 2. Stable cardiomegaly. No evidence of active CHF or pulmonary edema. 3. No significant change compared to yesterday's chest x-ray. Improved aeration at the lung bases compared to the earlier chest x-ray of  08/01/2016. Electronically Signed   By: Franki Cabot M.D.   On: 08/05/2016 11:43   Dg Chest Port 1 View  Result Date: 08/04/2016 CLINICAL DATA:  Shortness of breath. Bilateral lower extremity edema. EXAM: PORTABLE CHEST 1 VIEW COMPARISON:  August 01, 2016 FINDINGS: No pneumothorax. The right PICC line is stable. Stable cardiomegaly. The right pleural effusion and underlying opacity has decreased. No overt edema. Stable sclerotic lesion in the left humeral head. No other acute abnormalities. IMPRESSION: The right pleural effusion and underlying opacity is smaller in the interval. Stable cardiomegaly. Electronically Signed   By: Dorise Bullion III M.D   On: 08/04/2016 12:20   Dg Chest Port 1 View  Result Date: 08/01/2016 CLINICAL DATA:  Right PICC placement.  Initial encounter. EXAM: PORTABLE CHEST 1 VIEW COMPARISON:  Chest radiograph performed earlier today at 9:27 a.m. FINDINGS: The patient's right PICC is noted ending about the mid SVC. A small right pleural effusion is noted, with persistent right basilar airspace opacification, which may reflect pneumonia or possibly asymmetric pulmonary edema. This is relatively similar in appearance to the prior  study. No pneumothorax is seen. The cardiomediastinal silhouette remains enlarged. No acute osseous abnormalities are identified. A sclerotic focus at the left humeral neck likely reflects a bone island. IMPRESSION: 1. Right PICC noted ending about the mid SVC. 2. Small right pleural effusion, with persistent right basilar airspace opacification, which may reflect pneumonia or possibly asymmetric pulmonary edema. This is relatively similar in appearance to the prior study. 3. Cardiomegaly. Electronically Signed   By: Garald Balding M.D.   On: 08/01/2016 16:37   Dg Chest Port 1 View  Result Date: 08/01/2016 CLINICAL DATA:  Shortness of breath.  Wheezing. EXAM: PORTABLE CHEST 1 VIEW COMPARISON:  CT 07/31/2016.  Chest x-ray 07/30/2016 . FINDINGS: Cardiomegaly with pulmonary vascular prominence and bilateral interstitial prominence with with small bilateral pleural effusions. Low lung volumes. Bibasilar atelectasis. Bibasilar pneumonia cannot be excluded. No pneumothorax. IMPRESSION: 1. Cardiomegaly with pulmonary vascular prominence and bilateral interstitial prominence with bilateral small pleural effusions. Findings consistent with congestive heart failure . 2. Low lung volumes with bibasilar atelectasis. Bibasilar pneumonia cannot be excluded . Electronically Signed   By: Marcello Moores  Register   On: 08/01/2016 09:40   Dg Chest Port 1 View  Result Date: 07/30/2016 CLINICAL DATA:  Cough and shortness of breath EXAM: PORTABLE CHEST 1 VIEW COMPARISON:  Chest x-ray of July 19, 2015 and chest CT scan of the same date. FINDINGS: The lungs are well-expanded. There is increased density obscuring the right heart border more conspicuous than on the previous study. The interstitial markings elsewhere are coarse. There is no significant pleural effusion. The cardiac silhouette remains enlarged. The pulmonary vascularity is not engorged. IMPRESSION: Abnormal density in the right lower hemithorax more conspicuous than in the  past. The right heart border is obscured. This may reflect postobstructive atelectasis or pneumonia. Repeat chest CT scanning is recommended in an effort to exclude malignancy. Electronically Signed   By: David  Martinique M.D.   On: 07/30/2016 13:10    Microbiology: Recent Results (from the past 240 hour(s))  Blood Culture (routine x 2)     Status: None   Collection Time: 07/30/16  3:20 PM  Result Value Ref Range Status   Specimen Description BLOOD RIGHT ARM  Final   Special Requests BOTTLES DRAWN AEROBIC AND ANAEROBIC 5CC  Final   Culture NO GROWTH 5 DAYS  Final   Report Status 08/04/2016 FINAL  Final  Blood  Culture (routine x 2)     Status: None   Collection Time: 07/30/16  3:50 PM  Result Value Ref Range Status   Specimen Description BLOOD RIGHT HAND  Final   Special Requests BOTTLES DRAWN AEROBIC AND ANAEROBIC 5CC  Final   Culture NO GROWTH 5 DAYS  Final   Report Status 08/04/2016 FINAL  Final  MRSA PCR Screening     Status: None   Collection Time: 07/31/16 12:06 AM  Result Value Ref Range Status   MRSA by PCR NEGATIVE NEGATIVE Final    Comment:        The GeneXpert MRSA Assay (FDA approved for NASAL specimens only), is one component of a comprehensive MRSA colonization surveillance program. It is not intended to diagnose MRSA infection nor to guide or monitor treatment for MRSA infections.      Labs: Basic Metabolic Panel:  Recent Labs Lab 08/02/16 0400 08/02/16 0416 08/03/16 0515 08/04/16 0420 08/05/16 0500 08/06/16 0448  NA  --  141 141 140 139 140  K  --  3.3* 4.3 4.8 4.6 3.9  CL  --  93* 94* 96* 92* 97*  CO2  --  39* 38* 38* 35* 37*  GLUCOSE  --  102* 108* 102* 111* 97  BUN  --  15 18 17  25* 26*  CREATININE  --  0.70 0.71 0.77 0.80 0.80  CALCIUM  --  8.1* 8.5* 8.7* 9.2 8.5*  MG 1.8  --  2.0  --  2.2  --   PHOS  --  3.1 3.0 3.0 3.1 3.9   Liver Function Tests:  Recent Labs Lab 08/02/16 0416 08/03/16 0515 08/04/16 0420 08/05/16 0500 08/06/16 0448   ALBUMIN 3.3* 3.4* 3.3* 3.5 3.1*   No results for input(s): LIPASE, AMYLASE in the last 168 hours. No results for input(s): AMMONIA in the last 168 hours. CBC:  Recent Labs Lab 08/02/16 0400 08/05/16 1130 08/06/16 0448  WBC 7.0 7.7 7.8  HGB 13.7 13.1 13.1  HCT 44.4 41.2 42.1  MCV 95.5 93.6 92.7  PLT 124* 131* 138*   Cardiac Enzymes: No results for input(s): CKTOTAL, CKMB, CKMBINDEX, TROPONINI in the last 168 hours. BNP: BNP (last 3 results)  Recent Labs  07/30/16 1512  BNP 184.2*    ProBNP (last 3 results) No results for input(s): PROBNP in the last 8760 hours.  CBG:  Recent Labs Lab 08/07/16 1154 08/07/16 1630 08/07/16 2027 08/08/16 0717 08/08/16 1103  GLUCAP 97 104* 112* 100* 101*       Signed:  Xane Amsden MD.  [facility] 08/08/2016, 3:32 PM

## 2016-08-08 NOTE — Progress Notes (Signed)
Progress Note  Patient Name: [name] Date of Encounter: 08/08/2016  Primary Cardiologist: Dr. Rayann Heman    Subjective   Pt had an excellent night of rest and is ready for discharge. Her only complaint is for residual cough following URI.  Inpatient Medications    Scheduled Meds: . apixaban  5 mg Oral BID  . bisacodyl  5 mg Oral Daily  . ceFEPime (MAXIPIME) IV  1 g Intravenous Q24H  . ferrous sulfate  325 mg Oral Q breakfast  . insulin aspart  0-15 Units Subcutaneous TID WC  . insulin aspart  0-5 Units Subcutaneous QHS  . levothyroxine  125 mcg Oral QAC breakfast  . senna-docusate  1 tablet Oral BID  . sodium chloride flush  10-40 mL Intracatheter Q12H  . sodium chloride flush  10-40 mL Intracatheter Q12H  . sodium chloride flush  3 mL Intravenous Q12H   Continuous Infusions:  PRN Meds: acetaminophen **OR** acetaminophen, albuterol, HYDROcodone-acetaminophen, ondansetron **OR** ondansetron (ZOFRAN) IV, sodium chloride flush, sodium chloride flush, sodium phosphate   Vital Signs    Vitals:   08/07/16 2024 08/07/16 2025 08/08/16 0409 08/08/16 0413  BP:  109/61  124/77  Pulse:  69 85 74  Resp:  20 16 (!) 23  Temp: 97.8 F (36.6 C)  97.7 F (36.5 C)   TempSrc: Oral  Oral   SpO2:  95% (!) 89% 92%  Weight:   213 lb 6.5 oz (96.8 kg)   Height:        Intake/Output Summary (Last 24 hours) at 08/08/16 1101 Last data filed at 08/08/16 1035  Gross per 24 hour  Intake              970 ml  Output              775 ml  Net              195 ml   Filed Weights   08/05/16 0500 08/06/16 0427 08/08/16 0409  Weight: 210 lb 11.2 oz (95.6 kg) 210 lb (95.3 kg) 213 lb 6.5 oz (96.8 kg)    Telemetry    Afib/flutter with occassional 2-2.5 sec pauses, ventricular rate in the 80s  - Personally Reviewed  ECG    No new tracings  Physical Exam   General: Well developed, well nourished, female appearing in no acute distress. Head: Normocephalic, atraumatic.  Neck: Supple  without bruits, no JVD in chair Lungs:  Resp regular and unlabored, scattered coarse sounds throughout Heart: Irregular rhythm and regular rate, S1, S2, 2/6 holosystolic murmur Abdomen: Soft, non-tender, non-distended with normoactive bowel sounds. No hepatomegaly. No rebound/guarding. No obvious abdominal masses. Extremities: No clubbing, cyanosis, 1+ B edema, wraps in place. Distal pedal pulses are 1+ bilaterally. Neuro: Alert and oriented X 3. Moves all extremities spontaneously. Psych: Normal affect.  Labs    Chemistry Recent Labs Lab 08/04/16 0420 08/05/16 0500 08/06/16 0448  NA 140 139 140  K 4.8 4.6 3.9  CL 96* 92* 97*  CO2 38* 35* 37*  GLUCOSE 102* 111* 97  BUN 17 25* 26*  CREATININE 0.77 0.80 0.80  CALCIUM 8.7* 9.2 8.5*  ALBUMIN 3.3* 3.5 3.1*  GFRNONAA >60 >60 >60  GFRAA >60 >60 >60  ANIONGAP 6 12 6      Hematology Recent Labs Lab 08/02/16 0400 08/05/16 1130 08/06/16 0448  WBC 7.0 7.7 7.8  RBC 4.65 4.40 4.54  HGB 13.7 13.1 13.1  HCT 44.4 41.2 42.1  MCV 95.5 93.6 92.7  MCH 29.5 29.8 28.9  MCHC 30.9 31.8 31.1  RDW 15.2 14.7 14.9  PLT 124* 131* 138*    Cardiac EnzymesNo results for input(s): TROPONINI in the last 168 hours. No results for input(s): TROPIPOC in the last 168 hours.   BNPNo results for input(s): BNP, PROBNP in the last 168 hours.   DDimer No results for input(s): DDIMER in the last 168 hours.   Radiology    No results found.  Cardiac Studies   07/20/2015 Echo - Left ventricle: The cavity size was mildly dilated. Wall thickness was increased in a pattern of mild LVH. Systolic function was normal. The estimated ejection fraction was in the range of 55% to 60%. Wall motion was normal; there were no regional wall motion abnormalities. - Aortic valve: Valve mobility was restricted. There was moderate stenosis. - Mitral valve: Calcified annulus. Mildly thickened leaflets . There was moderate regurgitation. - Left atrium:  The atrium was severely dilated. - Right atrium: The atrium was mildly dilated. - Pulmonary arteries: Systolic pressure was moderately increased. PA peak pressure: 61 mm Hg (S). - Pericardium, extracardiac: A small pericardial effusion was identified. Impressions: - Normal LV systolic function; biatrial enlargement; heavily calcified aortic valve with moderate AS by mean gradient; moderate MR; mild TR with moderately elevated pulmonary pressure; small pericardial effusion.  Patient Profile     83 y.o. female with permanent atrial fibrillation, moderate aortic stenosis, chronic diastolic heart failure, obstructive sleep apnea and moderate pulmonary hypertension, diabetes mellitus, hypothyroidism, presenting with acute exacerbation of chronic heart failure in the setting of health care acquired pneumonia.  Assessment & Plan    1. Acute on chronic diastolic CHF: clinically improved. Was ready for discharge yesterday but she became weak and dizzy and was noted to have HR in the upper 30s/low 40s. She also reports a very stressful day making some decisions about disposition. Given softer BP will hold lisinopril and Lasix pending further observation. Anticipate we may be able to resume these in AM (she was set to go home on Lasix 60mg  daily and lisinopril 5mg  daily. Lasix 60mg  daily was a higher dose than traditional home dose.).  - sBP 110s - lasix held yesterday for soft pressure, with corresponding increase in weight: 213 lbs (210 lbs); 400 cc output with a net negative 8.4L overall - continue lasix at discharge, would recommend 7 days of 40 mg BID, then decrease to daily 60 mg lasix - pt not currently on lopressor or home diltiazem - given infrequent pauses, consider adding back lower dose of home diltiazem for rate control - add back lisinopril outpatient as pressure tolerates following 1 week of increased lasix regimen and diltiazem  2. Permanent AFib: She had RVR earlier this  admission which was treated with addition of metoprolol. I suspect her bradycardia stems from the fact that her underlying driving illness has improved. Will d/c metoprolol. Consider adding back lower dose of diltiazem (as above)  3. HTN:  - marginal pressures yesterday; pressure seems to have improved this morning - medication management as above   4. Hypothyroidism: abnormal TSH. Further per IM.  5. HCAP: Per IM.   Signed, Minette Brine , PA-C 11:01 AM 08/08/2016   I have personally seen and examined this patient with Doreene Adas, PA-C. I agree with the assessment and plan as outlined above. She is stable for d/c today. Agree with adding back Cardizem CD 120 mg Qdaily. Would not restart beta blocker. Agree with Lasix 40 mg po BID for  one week. She will need follow up in our office in 1 week. OK to d/c home. We will arrange f/u in our office 08/15/16. She is followed by Dr. Rayann Heman.   Lauree Chandler 08/08/2016 12:21 PM

## 2016-08-08 NOTE — Progress Notes (Signed)
Spoke with Adela Lank at [facility] and gave report on Mrs. Carkhuff, Pt will travel by [facility].

## 2016-08-09 ENCOUNTER — Encounter: Payer: Self-pay | Admitting: Physician Assistant

## 2016-08-09 DIAGNOSIS — I10 Essential (primary) hypertension: Secondary | ICD-10-CM | POA: Diagnosis not present

## 2016-08-09 DIAGNOSIS — I4891 Unspecified atrial fibrillation: Secondary | ICD-10-CM | POA: Diagnosis not present

## 2016-08-09 DIAGNOSIS — J189 Pneumonia, unspecified organism: Secondary | ICD-10-CM | POA: Diagnosis not present

## 2016-08-09 DIAGNOSIS — I5043 Acute on chronic combined systolic (congestive) and diastolic (congestive) heart failure: Secondary | ICD-10-CM | POA: Diagnosis not present

## 2016-08-09 NOTE — Telephone Encounter (Signed)
Patient's daughter, [name], contacted regarding discharge from [facility] on 2/7.  Patient's daughter understands to follow up with provider Ferol Luz, PA on 2/14 at 3 pm at [facility]. She states patient is at [facility] and advised that she will review d/c instructions and call back with questions or concerns. She thanked me for the call.

## 2016-08-10 DIAGNOSIS — J189 Pneumonia, unspecified organism: Secondary | ICD-10-CM | POA: Diagnosis not present

## 2016-08-10 DIAGNOSIS — R600 Localized edema: Secondary | ICD-10-CM | POA: Diagnosis not present

## 2016-08-10 DIAGNOSIS — I872 Venous insufficiency (chronic) (peripheral): Secondary | ICD-10-CM | POA: Diagnosis not present

## 2016-08-10 DIAGNOSIS — I10 Essential (primary) hypertension: Secondary | ICD-10-CM | POA: Diagnosis not present

## 2016-08-10 DIAGNOSIS — R001 Bradycardia, unspecified: Secondary | ICD-10-CM | POA: Diagnosis not present

## 2016-08-10 DIAGNOSIS — I4891 Unspecified atrial fibrillation: Secondary | ICD-10-CM | POA: Diagnosis not present

## 2016-08-10 DIAGNOSIS — E039 Hypothyroidism, unspecified: Secondary | ICD-10-CM | POA: Diagnosis not present

## 2016-08-10 DIAGNOSIS — N183 Chronic kidney disease, stage 3 (moderate): Secondary | ICD-10-CM | POA: Diagnosis not present

## 2016-08-10 DIAGNOSIS — I509 Heart failure, unspecified: Secondary | ICD-10-CM | POA: Diagnosis not present

## 2016-08-15 ENCOUNTER — Telehealth: Payer: Self-pay | Admitting: Physician Assistant

## 2016-08-15 ENCOUNTER — Encounter: Payer: Self-pay | Admitting: Physician Assistant

## 2016-08-15 ENCOUNTER — Ambulatory Visit (INDEPENDENT_AMBULATORY_CARE_PROVIDER_SITE_OTHER): Payer: Medicare Other | Admitting: Physician Assistant

## 2016-08-15 VITALS — BP 128/60 | HR 80 | Ht 61.0 in | Wt 219.0 lb

## 2016-08-15 DIAGNOSIS — I10 Essential (primary) hypertension: Secondary | ICD-10-CM

## 2016-08-15 DIAGNOSIS — I482 Chronic atrial fibrillation: Secondary | ICD-10-CM | POA: Diagnosis not present

## 2016-08-15 DIAGNOSIS — I5032 Chronic diastolic (congestive) heart failure: Secondary | ICD-10-CM

## 2016-08-15 DIAGNOSIS — Z7901 Long term (current) use of anticoagulants: Secondary | ICD-10-CM

## 2016-08-15 DIAGNOSIS — Z79899 Other long term (current) drug therapy: Secondary | ICD-10-CM | POA: Diagnosis not present

## 2016-08-15 DIAGNOSIS — I4821 Permanent atrial fibrillation: Secondary | ICD-10-CM

## 2016-08-15 NOTE — Progress Notes (Signed)
Cardiology Office Note    Date:  08/15/2016   ID:  TYNISE BUFFA, DOB 06/29/1920, MRN [id number]  PCP:  [name], MD  Cardiologist/ Electrophysiologist: Dr. Rayann Heman  Chief Complaint: Hospital follow up for CHF and afib   History of Present Illness:   [name] is a 83 y.o. female with a past medical history of permanent Afib (On Eliquis), HTN, moderate aortic stenosis, chronic diastolic CHF, HLD, OSA, and DM. Also withhistory of cancer - bladder, breast and ovarian. Recently admitted with acute on chronic diastolic CHF and HCAP presents for follow up.   Her last Echo 07/2016  with a LVEF of 55-60%, no wall motion abnormalities, Moderate AS, and biatrial enlargement.   The patient was admitted 07/30/16-08/07/16 for acute hypoxic respiratory failure in setting of pneumonia and CHF. Patient Was treated with IV Lasix with significant clinical improvement. Patient is net negative by almost 9.5 L. Blood pressure was intermittently soft during admission.She had afib RVR earlier in admission which was treated with addition of metoprolol. However, later developed symptomatic bradycardia in 30s to 40s. Discontinued metoprolol. Continued cardizem CD 120mg  qd. She was discharged on Lasix 40 MG twice a day for one week and then cut back to 40 MG once a day.  Today for follow-up. Discharge weight was 213 lb. Weight today 219lb. She has gained weight at skilled nursing facility as well. Her lower extremity edema has been getting worse for the past few days. She denies orthopnea, PND, syncope, or dizziness, chest pain, abdominal pain, nausea, vomiting melena or blood in her stool or urine. No extra salt.   Past Medical History:  Diagnosis Date  . Bladder cancer ([place])   . Breast cancer ([place])   . Cervical cancer ([place])   . Chronic diastolic heart failure ([place])    a. Echo 5/12: Mild LVH, EF 55-60%, mild AI, mild MR, severe LAE, mild RAE, PASP 31, small pericardial effusion  . Colon cancer  ([place])    "polyp" (12/08/2012)  . Diastolic CHF, chronic ([place]) 11/11/2012   Class 2b-3 2 d echo 5/12 mild LVH EF 55-50%, MILD AI, MILD MR, SEVERE LAE, mild RAE, PASP 31, SMALL PERICRADIAL EFFUSION  . DM2 (diabetes mellitus, type 2) ([place])    TYPE 2  . H/O ovarian cancer 11/11/2012    MUCINOUS CYST S/P RESECTION 35 YEARS AGO  . HLD (hyperlipidemia)   . Hx of bladder cancer 11/11/2012  . Hx of cervical cancer 11/11/2012  . Hypertension   . Hypothyroidism   . OSA (obstructive sleep apnea)   . Ovarian cancer ([place])   . Permanent atrial fibrillation ([facility])    a. coumadin d/c'd => Pradaxa in 05/2012  . S/P hysterectomy 11/11/2012  . SBO (small bowel obstruction)   . SBO (small bowel obstruction) 08/25/2014  . Venous insufficiency    chronic LE edema    Past Surgical History:  Procedure Laterality Date  . APPENDECTOMY    . BLADDER SURGERY    . BREAST BIOPSY Bilateral   . BREAST LUMPECTOMY Right   . CATARACT EXTRACTION W/ INTRAOCULAR LENS  IMPLANT, BILATERAL    . EXPLORATORY LAPAROTOMY WITH ABDOMINAL MASS EXCISION     "21# ovarian tumor; benign" (12/08/2012)  . I&D EXTREMITY Right 12/31/2012   Procedure: IRRIGATION AND DEBRIDEMENT RIGHT KNEE ULCER WITH PLACEMENT OF A CELL AND VAC ;  Surgeon: Theodoro Kos, DO;  Location: [facility];  Service: Plastics;  Laterality: Right;  . MASTECTOMY, RADICAL Left   . VAGINAL HYSTERECTOMY  Current Medications: Prior to Admission medications   Medication Sig Start Date End Date Taking? Authorizing Provider  albuterol (PROVENTIL) (2.5 MG/3ML) 0.083% nebulizer solution Take 3 mLs (2.5 mg total) by nebulization every 4 (four) hours as needed for wheezing or shortness of breath. 08/07/16   Dron Tanna Furry, MD  apixaban (ELIQUIS) 5 MG TABS tablet Take 1 tablet (5 mg total) by mouth 2 (two) times daily. 01/18/14   Thompson Grayer, MD  bisacodyl (DULCOLAX) 10 MG suppository Place 10 mg rectally as needed for moderate constipation.    Historical Provider, MD  bisacodyl  (DULCOLAX) 5 MG EC tablet Take 5 mg by mouth daily.     Historical Provider, MD  clobetasol cream (TEMOVATE) [id number] % Apply 1 application topically daily as needed (rash).    Historical Provider, MD  diltiazem (CARDIZEM CD) 120 MG 24 hr capsule Take 1 capsule (120 mg total) by mouth daily. 08/09/16   Eugenie Filler, MD  ferrous sulfate 325 (65 FE) MG tablet Take 325 mg by mouth daily with breakfast.    Historical Provider, MD  furosemide (LASIX) 40 MG tablet Take 1 tablet (40 mg total) by mouth 2 (two) times daily. Take lasix 40 mg BID x 1 week. 08/08/16 08/15/16  Eugenie Filler, MD  furosemide (LASIX) 40 MG tablet Take 1.5 tablets (60 mg total) by mouth daily. Take 1.5 tablets(60mg ) daily starting 08/17/2015. 08/16/16   Eugenie Filler, MD  insulin aspart (NOVOLOG) 100 UNIT/ML injection Inject 0-15 Units into the skin 3 (three) times daily with meals. 08/07/16   Dron Tanna Furry, MD  levothyroxine (SYNTHROID, LEVOTHROID) 125 MCG tablet Take 125 mcg by mouth daily before breakfast.    Historical Provider, MD  potassium chloride SA (K-DUR,KLOR-CON) 20 MEQ tablet Take 20 mEq by mouth daily.    Historical Provider, MD  senna-docusate (SENOKOT-S) 8.6-50 MG per tablet Take 1 tablet by mouth 2 (two) times daily. 08/31/14   Geradine Girt, DO  sodium phosphate (FLEET) 7-19 GM/118ML ENEM Place 133 mLs (1 enema total) rectally daily as needed for severe constipation. 08/31/14   Geradine Girt, DO    Allergies:   Morphine and related; Statins; Zetia [ezetimibe]; Atorvastatin; Morphine; and Codeine   Social History   Social History  . Marital status: Widowed    Spouse name: N/A  . Number of children: N/A  . Years of education: N/A   Social History Main Topics  . Smoking status: Former Smoker    Packs/day: 3.00    Years: 25.00    Types: Cigarettes    Quit date: 07/02/1966  . Smokeless tobacco: Never Used  . Alcohol use No  . Drug use: No  . Sexual activity: No   Other Topics Concern  . None    Social History Narrative  . None     Family History:  The patient's Family history is unknown by patient.   ROS:   Please see the history of present illness.    ROS All other systems reviewed and are negative.   PHYSICAL EXAM:   VS:  BP 128/60   Pulse 80   Ht 5\' 1"  (1.549 m)   Wt 219 lb (99.3 kg)   SpO2 95%   BMI 41.38 kg/m    GEN: Well nourished, well developed, in no acute distress  HEENT: normal  Neck: no JVD, carotid bruits, or masses Cardiac: Irregular ; no murmurs, rubs, or gallops, 1-2+ BL LE edema with unna boot Respiratory:  clear to auscultation  bilaterally, normal work of breathing GI: soft, nontender, distended, + BS MS: no deformity or atrophy  Skin: warm and dry, no rash Neuro:  Alert and Oriented x 3, Strength and sensation are intact Psych: euthymic mood, full affect  Wt Readings from Last 3 Encounters:  08/15/16 219 lb (99.3 kg)  08/08/16 213 lb 6.5 oz (96.8 kg)  04/12/16 219 lb (99.3 kg)      Studies/Labs Reviewed:   EKG:  EKG is not ordered today.    Recent Labs: 07/30/2016: ALT 11; B Natriuretic Peptide 184.2 08/05/2016: Magnesium 2.2 08/06/2016: BUN 26; Creatinine, Ser 0.80; Hemoglobin 13.1; Platelets 138; Potassium 3.9; Sodium 140; TSH 9.573   Lipid Panel    Component Value Date/Time   CHOL 173 11/04/2010 0430   TRIG 100 11/04/2010 0430   HDL 45 11/04/2010 0430   CHOLHDL 3.8 11/04/2010 0430   VLDL 20 11/04/2010 0430   LDLCALC (H) 11/04/2010 0430    108        Total Cholesterol/HDL:CHD Risk Coronary Heart Disease Risk Table                     Men   Women  1/2 Average Risk   3.4   3.3  Average Risk       5.0   4.4  2 X Average Risk   9.6   7.1  3 X Average Risk  23.4   11.0        Use the calculated Patient Ratio above and the CHD Risk Table to determine the patient's CHD Risk.        ATP III CLASSIFICATION (LDL):  <100     mg/dL   Optimal  100-129  mg/dL   Near or Above                    Optimal  130-159  mg/dL    Borderline  160-189  mg/dL   High  >190     mg/dL   Very High    Additional studies/ records that were reviewed today include:   Echocardiogram: 07/20/15 Study Conclusions  - Left ventricle: The cavity size was mildly dilated. Wall   thickness was increased in a pattern of mild LVH. Systolic   function was normal. The estimated ejection fraction was in the   range of 55% to 60%. Wall motion was normal; there were no   regional wall motion abnormalities. - Aortic valve: Valve mobility was restricted. There was moderate   stenosis. - Mitral valve: Calcified annulus. Mildly thickened leaflets .   There was moderate regurgitation. - Left atrium: The atrium was severely dilated. - Right atrium: The atrium was mildly dilated. - Pulmonary arteries: Systolic pressure was moderately increased.   PA peak pressure: 61 mm Hg (S). - Pericardium, extracardiac: A small pericardial effusion was   identified.  Impressions:  - Normal LV systolic function; biatrial enlargement; heavily   calcified aortic valve with moderate AS by mean gradient;   moderate MR; mild TR with moderately elevated pulmonary pressure;   small pericardial effusion.    ASSESSMENT & PLAN:    1. Permanent atrial fibrillation - Initially during admission heart rate was elevated leading to addition of metoprolol. However later developing symptomatic bradycardia leading to discontinuation of beta blocker. She continued on Cardizem CD 120 mg. Continue Eliquis  for anticoagulation.  2. Chronic diastolic CHF -She has gained 6 pounds since discharge. Evidence of overload on exam. Continue Unna boot. Continue Lasix  40 mg twice a day for another week and  reevaluate in a skilled nursing facility at that time. IF edema improves, cut back lasix to 60mg  qd. BMET today. Advised to elevate legs above heart level for 30 minutes twice a day. Heart healthy diet.   *Weigh yourself on the same scale at same time of day and keep a  log. *Report weight gain of  3 lbs in 1 day or 5 lbs over the course of a week and/or symptoms of excess fluid (shortness of breath, difficulty lying flat, swelling, poor appetite, abdominal fullness/bloating, etc) to your doctor immediately. *Avoid foods that are high in sodium (processed, pre-packaged/canned goods, fast foods, etc). *Please attend all scheduled and reccommended follow up appointments   3. HTN - Stable and well controlled on current medications.   4. Hypothyroidism - Abnormal TSH during admission. Pt will follow up with PCP.     Medication Adjustments/Labs and Tests Ordered: Current medicines are reviewed at length with the patient today.  Concerns regarding medicines are outlined above.  Medication changes, Labs and Tests ordered today are listed in the Patient Instructions below. Patient Instructions  Your physician has recommended you make the following change in your medication:  CONTINUE  FUROSEMIDE  40 MG  TWICE A DAY X 1 WEEK THEN  DECREASE FUROSEMIDE  TO   60 MG  DAILY STARTING   08-23-16 [facility] BMET  Your physician recommends that you return for lab work in:  [place] 7 DAYS    Your physician recommends that you schedule a follow-up appointment in: 2-3  MONTHS  WITH DR Rema Jasmine, [place]  08/15/2016 3:36 PM    [facility] [address], [place], [place]  [address] Phone: [phone]; Fax: [phone]

## 2016-08-15 NOTE — Telephone Encounter (Signed)
New Message:   Question about whether she can draw from  pt's left hand

## 2016-08-15 NOTE — Telephone Encounter (Signed)
I spoke with [name] at [facility] and she called the office to clarify what arm to draw blood from.  She said the pt recently had a PICC line removed from right arm and had left mastectomy.  I spoke with [name] and pt can have blood drawn from right hand.  I made [facility] aware of this information.

## 2016-08-15 NOTE — Patient Instructions (Addendum)
Your physician has recommended you make the following change in your medication:  CONTINUE  FUROSEMIDE  40 MG  TWICE A DAY X 1 WEEK THEN  DECREASE FUROSEMIDE  TO   60 MG  DAILY STARTING   08-23-16 [facility] BMET  Your physician recommends that you return for lab work in:  [place] 7 DAYS    Your physician recommends that you schedule a follow-up appointment in: 2-3  [place]

## 2016-08-16 LAB — BASIC METABOLIC PANEL
BUN / CREAT RATIO: 20 (ref 12–28)
BUN: 16 mg/dL (ref 10–36)
CHLORIDE: 99 mmol/L (ref 96–106)
CO2: 33 mmol/L — AB (ref 18–29)
CREATININE: 0.8 mg/dL (ref 0.57–1.00)
Calcium: 8.9 mg/dL (ref 8.7–10.3)
GFR calc Af Amer: 73 mL/min/{1.73_m2} (ref >59)
GFR calc non Af Amer: 63 mL/min/{1.73_m2} (ref >59)
GLUCOSE: 92 mg/dL (ref 65–99)
Potassium: 4.1 mmol/L (ref 3.5–5.2)
SODIUM: 134 mmol/L (ref 134–144)

## 2016-08-21 ENCOUNTER — Other Ambulatory Visit: Payer: Self-pay | Admitting: *Deleted

## 2016-08-21 DIAGNOSIS — I5043 Acute on chronic combined systolic (congestive) and diastolic (congestive) heart failure: Secondary | ICD-10-CM | POA: Diagnosis not present

## 2016-08-21 DIAGNOSIS — I4891 Unspecified atrial fibrillation: Secondary | ICD-10-CM | POA: Diagnosis not present

## 2016-08-21 DIAGNOSIS — E119 Type 2 diabetes mellitus without complications: Secondary | ICD-10-CM | POA: Diagnosis not present

## 2016-08-21 DIAGNOSIS — J189 Pneumonia, unspecified organism: Secondary | ICD-10-CM | POA: Diagnosis not present

## 2016-08-21 NOTE — Patient Outreach (Signed)
[facility]) Care Management  08/21/2016  [name] 06/20/1920 [id number]   Met with patient at bedside.  Per patient she has history of cancer, Atrial Fibrillation, and pneumonia.  Patient reports she is going back to her ALF at end of week. She states she has been at the facility for several years. She reports they assist with ADLs, IADLs, medications and transportation.  Patient denies any care management needs, she states her ALF care is covered by [facility] for life. She reports her spouse served in the [employer] during [place].  Patient has a daughter who lives in [place], but checks in on her often.   Patient given brochure regarding [facility] care management for any future needs.  Plan to sign off case as no needs identified at this time.   Unable to meet with SW at facility as they were not on site.  Royetta Crochet. Laymond Purser, RN, BSN, [place] [phone]) Business Cell  [phone]) Toll Free Office

## 2016-08-22 ENCOUNTER — Encounter: Payer: Self-pay | Admitting: *Deleted

## 2016-08-24 DIAGNOSIS — B372 Candidiasis of skin and nail: Secondary | ICD-10-CM | POA: Diagnosis not present

## 2016-08-24 DIAGNOSIS — E119 Type 2 diabetes mellitus without complications: Secondary | ICD-10-CM | POA: Diagnosis not present

## 2016-08-24 DIAGNOSIS — J189 Pneumonia, unspecified organism: Secondary | ICD-10-CM | POA: Diagnosis not present

## 2016-08-24 DIAGNOSIS — I5043 Acute on chronic combined systolic (congestive) and diastolic (congestive) heart failure: Secondary | ICD-10-CM | POA: Diagnosis not present

## 2016-08-29 DIAGNOSIS — R238 Other skin changes: Secondary | ICD-10-CM | POA: Diagnosis not present

## 2016-08-29 DIAGNOSIS — L03116 Cellulitis of left lower limb: Secondary | ICD-10-CM | POA: Diagnosis not present

## 2016-08-29 DIAGNOSIS — L03115 Cellulitis of right lower limb: Secondary | ICD-10-CM | POA: Diagnosis not present

## 2016-08-29 DIAGNOSIS — I5033 Acute on chronic diastolic (congestive) heart failure: Secondary | ICD-10-CM | POA: Diagnosis not present

## 2016-08-29 DIAGNOSIS — I110 Hypertensive heart disease with heart failure: Secondary | ICD-10-CM | POA: Diagnosis not present

## 2016-08-29 DIAGNOSIS — I4891 Unspecified atrial fibrillation: Secondary | ICD-10-CM | POA: Diagnosis not present

## 2016-08-30 DIAGNOSIS — I10 Essential (primary) hypertension: Secondary | ICD-10-CM | POA: Diagnosis not present

## 2016-08-30 DIAGNOSIS — I509 Heart failure, unspecified: Secondary | ICD-10-CM | POA: Diagnosis not present

## 2016-08-30 DIAGNOSIS — R609 Edema, unspecified: Secondary | ICD-10-CM | POA: Diagnosis not present

## 2016-08-30 DIAGNOSIS — D696 Thrombocytopenia, unspecified: Secondary | ICD-10-CM | POA: Diagnosis not present

## 2016-08-30 DIAGNOSIS — E039 Hypothyroidism, unspecified: Secondary | ICD-10-CM | POA: Diagnosis not present

## 2016-08-31 DIAGNOSIS — I5033 Acute on chronic diastolic (congestive) heart failure: Secondary | ICD-10-CM | POA: Diagnosis not present

## 2016-08-31 DIAGNOSIS — I4891 Unspecified atrial fibrillation: Secondary | ICD-10-CM | POA: Diagnosis not present

## 2016-08-31 DIAGNOSIS — L03115 Cellulitis of right lower limb: Secondary | ICD-10-CM | POA: Diagnosis not present

## 2016-08-31 DIAGNOSIS — I110 Hypertensive heart disease with heart failure: Secondary | ICD-10-CM | POA: Diagnosis not present

## 2016-08-31 DIAGNOSIS — R238 Other skin changes: Secondary | ICD-10-CM | POA: Diagnosis not present

## 2016-08-31 DIAGNOSIS — L03116 Cellulitis of left lower limb: Secondary | ICD-10-CM | POA: Diagnosis not present

## 2016-09-04 ENCOUNTER — Encounter (HOSPITAL_COMMUNITY): Payer: Self-pay

## 2016-09-04 ENCOUNTER — Inpatient Hospital Stay (HOSPITAL_COMMUNITY)
Admission: EM | Admit: 2016-09-04 | Discharge: 2016-09-06 | DRG: 291 | Disposition: A | Discharge status: Skilled Nursing Facility | Payer: Medicare Other | Attending: Internal Medicine | Admitting: Internal Medicine

## 2016-09-04 ENCOUNTER — Emergency Department (HOSPITAL_COMMUNITY): Payer: Medicare Other

## 2016-09-04 DIAGNOSIS — I5033 Acute on chronic diastolic (congestive) heart failure: Secondary | ICD-10-CM | POA: Diagnosis present

## 2016-09-04 DIAGNOSIS — Z6841 Body Mass Index (BMI) 40.0 and over, adult: Secondary | ICD-10-CM

## 2016-09-04 DIAGNOSIS — J209 Acute bronchitis, unspecified: Secondary | ICD-10-CM | POA: Diagnosis not present

## 2016-09-04 DIAGNOSIS — I878 Other specified disorders of veins: Secondary | ICD-10-CM | POA: Diagnosis present

## 2016-09-04 DIAGNOSIS — R0602 Shortness of breath: Secondary | ICD-10-CM | POA: Diagnosis not present

## 2016-09-04 DIAGNOSIS — R0902 Hypoxemia: Secondary | ICD-10-CM | POA: Diagnosis not present

## 2016-09-04 DIAGNOSIS — I5043 Acute on chronic combined systolic (congestive) and diastolic (congestive) heart failure: Secondary | ICD-10-CM | POA: Diagnosis not present

## 2016-09-04 DIAGNOSIS — Z87891 Personal history of nicotine dependence: Secondary | ICD-10-CM

## 2016-09-04 DIAGNOSIS — R112 Nausea with vomiting, unspecified: Secondary | ICD-10-CM | POA: Diagnosis present

## 2016-09-04 DIAGNOSIS — I482 Chronic atrial fibrillation, unspecified: Secondary | ICD-10-CM

## 2016-09-04 DIAGNOSIS — G4733 Obstructive sleep apnea (adult) (pediatric): Secondary | ICD-10-CM | POA: Diagnosis present

## 2016-09-04 DIAGNOSIS — Z794 Long term (current) use of insulin: Secondary | ICD-10-CM

## 2016-09-04 DIAGNOSIS — E039 Hypothyroidism, unspecified: Secondary | ICD-10-CM | POA: Diagnosis present

## 2016-09-04 DIAGNOSIS — Z9841 Cataract extraction status, right eye: Secondary | ICD-10-CM

## 2016-09-04 DIAGNOSIS — J9601 Acute respiratory failure with hypoxia: Secondary | ICD-10-CM | POA: Diagnosis present

## 2016-09-04 DIAGNOSIS — Z90710 Acquired absence of both cervix and uterus: Secondary | ICD-10-CM

## 2016-09-04 DIAGNOSIS — Z7901 Long term (current) use of anticoagulants: Secondary | ICD-10-CM

## 2016-09-04 DIAGNOSIS — Z22322 Carrier or suspected carrier of Methicillin resistant Staphylococcus aureus: Secondary | ICD-10-CM

## 2016-09-04 DIAGNOSIS — Z85038 Personal history of other malignant neoplasm of large intestine: Secondary | ICD-10-CM

## 2016-09-04 DIAGNOSIS — Z79899 Other long term (current) drug therapy: Secondary | ICD-10-CM

## 2016-09-04 DIAGNOSIS — L97909 Non-pressure chronic ulcer of unspecified part of unspecified lower leg with unspecified severity: Secondary | ICD-10-CM | POA: Diagnosis present

## 2016-09-04 DIAGNOSIS — E785 Hyperlipidemia, unspecified: Secondary | ICD-10-CM | POA: Diagnosis present

## 2016-09-04 DIAGNOSIS — I872 Venous insufficiency (chronic) (peripheral): Secondary | ICD-10-CM

## 2016-09-04 DIAGNOSIS — I509 Heart failure, unspecified: Secondary | ICD-10-CM

## 2016-09-04 DIAGNOSIS — I110 Hypertensive heart disease with heart failure: Principal | ICD-10-CM | POA: Diagnosis present

## 2016-09-04 DIAGNOSIS — Z8541 Personal history of malignant neoplasm of cervix uteri: Secondary | ICD-10-CM

## 2016-09-04 DIAGNOSIS — I4821 Permanent atrial fibrillation: Secondary | ICD-10-CM | POA: Diagnosis present

## 2016-09-04 DIAGNOSIS — I10 Essential (primary) hypertension: Secondary | ICD-10-CM | POA: Diagnosis present

## 2016-09-04 DIAGNOSIS — Z8543 Personal history of malignant neoplasm of ovary: Secondary | ICD-10-CM

## 2016-09-04 DIAGNOSIS — E118 Type 2 diabetes mellitus with unspecified complications: Secondary | ICD-10-CM | POA: Diagnosis present

## 2016-09-04 DIAGNOSIS — Z885 Allergy status to narcotic agent status: Secondary | ICD-10-CM

## 2016-09-04 DIAGNOSIS — Z8551 Personal history of malignant neoplasm of bladder: Secondary | ICD-10-CM

## 2016-09-04 DIAGNOSIS — Z9842 Cataract extraction status, left eye: Secondary | ICD-10-CM

## 2016-09-04 DIAGNOSIS — E11622 Type 2 diabetes mellitus with other skin ulcer: Secondary | ICD-10-CM | POA: Diagnosis present

## 2016-09-04 DIAGNOSIS — Z853 Personal history of malignant neoplasm of breast: Secondary | ICD-10-CM

## 2016-09-04 DIAGNOSIS — J189 Pneumonia, unspecified organism: Secondary | ICD-10-CM | POA: Diagnosis not present

## 2016-09-04 DIAGNOSIS — Z888 Allergy status to other drugs, medicaments and biological substances status: Secondary | ICD-10-CM

## 2016-09-04 DIAGNOSIS — Z9012 Acquired absence of left breast and nipple: Secondary | ICD-10-CM

## 2016-09-04 DIAGNOSIS — Z66 Do not resuscitate: Secondary | ICD-10-CM | POA: Diagnosis present

## 2016-09-04 DIAGNOSIS — Z961 Presence of intraocular lens: Secondary | ICD-10-CM | POA: Diagnosis present

## 2016-09-04 DIAGNOSIS — I83009 Varicose veins of unspecified lower extremity with ulcer of unspecified site: Secondary | ICD-10-CM | POA: Diagnosis present

## 2016-09-04 HISTORY — DX: Pneumonia, unspecified organism: J18.9

## 2016-09-04 HISTORY — DX: Unspecified intestinal obstruction, unspecified as to partial versus complete obstruction: K56.609

## 2016-09-04 NOTE — ED Triage Notes (Signed)
Pt BIB [facility] from [facility]. Pt was seen at PCP today and PCP wanted her to be on O2. PCP called facility to see if pt was on O2 and they reported that she was not. [facility] was not able to deliver O2 to facility until the morning so PCP advised pt come to hospital until O2 was delivered. Per daughter at bedside while pt was at PCP her O2 was in the 80s and upon ambulation she was at 77% RA. Daughter also reports pt has recent hx of pneumonia.

## 2016-09-04 NOTE — ED Notes (Signed)
Patient transported to X-ray 

## 2016-09-04 NOTE — ED Provider Notes (Signed)
[facility] DEPT Provider Note   CSN: [id number] Arrival date & time: 09/04/16  2212   By signing my name below, I, Neta Mends, attest that this documentation has been prepared under the direction and in the presence of [facility], PA-C. Electronically Signed: Neta Mends, ED Scribe. 09/04/2016. 10:54 PM.   History   Chief Complaint Chief Complaint  Patient presents with  . Shortness of Breath    The history is provided by the patient and medical records. No language interpreter was used.   HPI Comments:  [name] is a 83 y.o. female with a history of chronic A. fib, anticoagulated, CHF, breast cancer, colon cancer, type 2 diabetes, pressure ulcer who presents to the Emergency Department complaining of worsening SOB x 1 month. Daughter reports that pt was seen here on 07/30/16 for pneumonia and was discharged 2 weeks later. She was d/c to rehab Facility on oxygen. She was weaned off of the oxygen proximal 0.2 weeks ago and went back to [facility] assisted living 8 days ago (off oxygen). She was seen by her PCP Thursday and her O2 sat was in [place].  Chest x-ray at that time showed improving pneumonia.  Family reports that patient has had worsening fatigue and shortness of breath over the weekend and she was taken to equal walk-in clinic today where she was found to have oxygen saturations in the mid-80s and dropped to 77% after walking. Her SOB is made worse with exertion and laying flat. Daughter notes that pt has been having difficulty ambulating due to worsening SOB, but is ambulatory with a walker.  Pt has been taking albuterol PRN regularly with no relief.  Pt denies fever.   Past Medical History:  Diagnosis Date  . Bladder cancer ([place])   . Breast cancer ([place])   . Cervical cancer ([place])   . Chronic diastolic heart failure ([place])    a. Echo 5/12: Mild LVH, EF 55-60%, mild AI, mild MR, severe LAE, mild RAE, PASP 31, small pericardial effusion  . Colon  cancer ([place])    "polyp" (12/08/2012)  . Diastolic CHF, chronic ([place]) 11/11/2012   Class 2b-3 2 d echo 5/12 mild LVH EF 55-50%, MILD AI, MILD MR, SEVERE LAE, mild RAE, PASP 31, SMALL PERICRADIAL EFFUSION  . DM2 (diabetes mellitus, type 2) ([place])    TYPE 2  . H/O ovarian cancer 11/11/2012    MUCINOUS CYST S/P RESECTION 35 YEARS AGO  . HLD (hyperlipidemia)   . Hx of bladder cancer 11/11/2012  . Hx of cervical cancer 11/11/2012  . Hypertension   . Hypothyroidism   . OSA (obstructive sleep apnea)   . Ovarian cancer ([place])   . Permanent atrial fibrillation (HCC)    a. coumadin d/c'd => Pradaxa in 05/2012  . S/P hysterectomy 11/11/2012  . SBO (small bowel obstruction)   . SBO (small bowel obstruction) 08/25/2014  . Venous insufficiency    chronic LE edema    Patient Active Problem List   Diagnosis Date Noted  . CHF (congestive heart failure) ([place]) 09/05/2016  . Bradycardia   . Pressure injury of skin 08/03/2016  . Goals of care, counseling/discussion   . Diabetes mellitus with complication ([place])   . Bilateral leg edema 07/30/2016  . Acute respiratory failure with hypoxia ([place]) 07/30/2016  . Type A influenza 07/26/2015  . HCAP (healthcare-associated pneumonia) 07/19/2015  . Bowel obstruction 08/25/2014  . Atrial fibrillation with RVR ([place]) 08/25/2014  . Acute renal failure ([place]) 08/25/2014  . Acute  diastolic congestive heart failure ([place]) 08/25/2014  . AKI (acute kidney injury) ([place]) 12/29/2013  . Small bowel obstruction 12/28/2013  . Essential hypertension 03/23/2013  . Hematoma 12/08/2012  . Nausea & vomiting 11/11/2012  . Diabetes mellitus ([place]) 11/11/2012  . Hypothyroidism 11/11/2012  . Hx SBO 11/11/2012  . Colon cancer ([place]) 11/11/2012  . Acute on chronic diastolic CHF (congestive heart failure) ([place]) 11/11/2012  . Venous insufficiency 11/11/2012  . Hyperlipidemia 11/11/2012  . OSA (obstructive sleep apnea) 11/11/2012  . HX: breast cancer 11/11/2012  . Hx of cervical cancer  11/11/2012  . H/O ovarian cancer 11/11/2012  . Hx of bladder cancer 11/11/2012  . S/P hysterectomy 11/11/2012  . PNA (pneumonia) 11/11/2012  . Hypokalemia 12/09/2011  . SBO (small bowel obstruction) 12/06/2011  . Acute on chronic combined systolic and diastolic CHF (congestive heart failure) ([place]) 02/17/2011  . Permanent atrial fibrillation ([place]) 02/17/2011    Past Surgical History:  Procedure Laterality Date  . APPENDECTOMY    . BLADDER SURGERY    . BREAST BIOPSY Bilateral   . BREAST LUMPECTOMY Right   . CATARACT EXTRACTION W/ INTRAOCULAR LENS  IMPLANT, BILATERAL    . EXPLORATORY LAPAROTOMY WITH ABDOMINAL MASS EXCISION     "21# ovarian tumor; benign" (12/08/2012)  . I&D EXTREMITY Right 12/31/2012   Procedure: IRRIGATION AND DEBRIDEMENT RIGHT KNEE ULCER WITH PLACEMENT OF A CELL AND VAC ;  Surgeon: Theodoro Kos, DO;  Location: [facility];  Service: Plastics;  Laterality: Right;  . MASTECTOMY, RADICAL Left   . VAGINAL HYSTERECTOMY      OB History    No data available       Home Medications    Prior to Admission medications   Medication Sig Start Date End Date Taking? Authorizing Provider  albuterol (PROVENTIL) (2.5 MG/3ML) 0.083% nebulizer solution Take 3 mLs (2.5 mg total) by nebulization every 4 (four) hours as needed for wheezing or shortness of breath. 08/07/16   Dron Tanna Furry, MD  apixaban (ELIQUIS) 5 MG TABS tablet Take 1 tablet (5 mg total) by mouth 2 (two) times daily. 01/18/14   [place] Grayer, MD  bisacodyl (DULCOLAX) 10 MG suppository Place 10 mg rectally as needed for moderate constipation.    Historical Provider, MD  bisacodyl (DULCOLAX) 5 MG EC tablet Take 5 mg by mouth daily.     Historical Provider, MD  clobetasol cream (TEMOVATE) [id number] % Apply 1 application topically daily as needed (rash).    Historical Provider, MD  diltiazem (CARDIZEM CD) 120 MG 24 hr capsule Take 1 capsule (120 mg total) by mouth daily. 08/09/16   Eugenie Filler, MD  ferrous sulfate 325 (65 FE)  MG tablet Take 325 mg by mouth daily with breakfast.    Historical Provider, MD  furosemide (LASIX) 40 MG tablet Take 1.5 tablets (60 mg total) by mouth daily. Take 1.5 tablets(60mg ) daily starting 08/17/2015. 08/16/16   Eugenie Filler, MD  insulin aspart (NOVOLOG) 100 UNIT/ML injection Inject 0-15 Units into the skin 3 (three) times daily with meals. 08/07/16   Dron Tanna Furry, MD  levothyroxine (SYNTHROID, LEVOTHROID) 125 MCG tablet Take 125 mcg by mouth daily before breakfast.    Historical Provider, MD  potassium chloride SA (K-DUR,KLOR-CON) 20 MEQ tablet Take 20 mEq by mouth daily.    Historical Provider, MD  senna-docusate (SENOKOT-S) 8.6-50 MG per tablet Take 1 tablet by mouth 2 (two) times daily. 08/31/14   Geradine Girt, DO  sodium phosphate (FLEET) 7-19 GM/118ML ENEM Place 133 mLs (  1 enema total) rectally daily as needed for severe constipation. 08/31/14   Geradine Girt, DO    Family History Family History  Problem Relation Age of Onset  . Family history unknown: Yes    Social History Social History  Substance Use Topics  . Smoking status: Former Smoker    Packs/day: 3.00    Years: 25.00    Types: Cigarettes    Quit date: 07/02/1966  . Smokeless tobacco: Never Used  . Alcohol use No     Allergies   Morphine and related; Statins; Zetia [ezetimibe]; Atorvastatin; Morphine; and Codeine   Review of Systems Review of Systems  Constitutional: Negative for fever.  Respiratory: Positive for shortness of breath. Negative for cough.        Hypoxia  Neurological: Positive for weakness ( generalized).  All other systems reviewed and are negative.    Physical Exam Updated Vital Signs BP 139/94   Pulse 101   Temp 97.6 F (36.4 C) (Oral)   Resp 25   Ht 5\' 1"  (1.549 m)   Wt 217 lb (98.4 kg)   SpO2 95%   BMI 41.00 kg/m   Physical Exam  Constitutional: She appears well-developed and well-nourished. No distress.  Awake, alert, nontoxic appearance  HENT:  Head:  Normocephalic and atraumatic.  Mouth/Throat: Oropharynx is clear and moist. Mucous membranes are dry. No oropharyngeal exudate.  Eyes: Conjunctivae are normal. No scleral icterus.  Neck: Normal range of motion. Neck supple.  Cardiovascular: Regular rhythm and intact distal pulses.  Tachycardia present.   Pulmonary/Chest: Effort normal. Tachypnea noted. No respiratory distress. She has decreased breath sounds in the left upper field, the left middle field and the left lower field. She has rales in the left middle field and the left lower field.  Equal chest expansion  Abdominal: Soft. Bowel sounds are normal. She exhibits distension. She exhibits no mass. There is no tenderness. There is no rigidity, no rebound and no guarding.  Musculoskeletal: Normal range of motion.  Unna boot to BLE, no pitting edema above the [employer] boot  Neurological: She is alert.  Speech is clear and goal oriented Moves extremities without ataxia  Skin: Skin is warm and dry. She is not diaphoretic.  Psychiatric: She has a normal mood and affect.  Nursing note and vitals reviewed.    ED Treatments / Results  DIAGNOSTIC STUDIES:  Oxygen Saturation is 95% on NCO2 at 4LPM, adequate by my interpretation.    COORDINATION OF CARE:  10:53 PM Discussed treatment plan with pt at bedside and pt agreed to plan.   Labs (all labs ordered are listed, but only abnormal results are displayed) Labs Reviewed  CBC WITH DIFFERENTIAL/PLATELET - Abnormal; Notable for the following:       Result Value   RDW 18.2 (*)    Platelets 135 (*)    All other components within normal limits  COMPREHENSIVE METABOLIC PANEL - Abnormal; Notable for the following:    CO2 33 (*)    Glucose, Bld 100 (*)    BUN 23 (*)    Total Protein 6.1 (*)    Albumin 3.4 (*)    ALT 8 (*)    GFR calc non Af Amer 52 (*)    GFR calc Af Amer 60 (*)    All other components within normal limits  BRAIN NATRIURETIC PEPTIDE - Abnormal; Notable for the following:     B Natriuretic Peptide 305.5 (*)    All other components within normal limits  URINALYSIS, ROUTINE W REFLEX MICROSCOPIC  I-STAT TROPOININ, ED    EKG  EKG Interpretation  Date/Time:  Tuesday September 04 2016 23:40:54 EST Ventricular Rate:  90 PR Interval:    QRS Duration: 82 QT Interval:  369 QTC Calculation: 452 R Axis:   42 Text Interpretation:  Atrial fibrillation Anterior infarct, old rate faster than previous tracing, otherwise no significant change Confirmed by LITTLE MD, RACHEL [id number]) on 09/05/2016 12:04:32 AM       Radiology Dg Chest 2 View  Result Date: 09/05/2016 CLINICAL DATA:  Wheezing shortness of breath and hypoxia EXAM: CHEST  2 VIEW COMPARISON:  08/30/2016 FINDINGS: Small bilateral pleural effusions, increased on the right side and probably layering. Moderate severe cardiomegaly with central vascular congestion. Atelectasis left lung base. Hazy opacity, new compared to prior, within the right lung base, may relate to combination of layering effusion, edema and or infiltrate. No pneumothorax. Sclerotic lesion in the proximal left humerus unchanged IMPRESSION: 1. Cardiomegaly. 2. Increased hazy opacity in the right lower lung, could relate to layering pleural effusion although difficult to exclude asymmetric edema or infiltrates 3. Small left pleural effusion. Electronically Signed   By: Donavan Foil M.D.   On: 09/05/2016 00:14    Procedures Procedures (including critical care time)  Medications Ordered in ED Medications  furosemide (LASIX) injection 40 mg (not administered)     Initial Impression / Assessment and Plan / ED Course  I have reviewed the triage vital signs and the nursing notes.  Pertinent labs & imaging results that were available during my care of the patient were reviewed by me and considered in my medical decision making (see chart for details).  Clinical Course as of Sep 05 200  Tue Sep 04, 2016  2344 The patient was discussed with and seen by  Dr. Rex Kras who agrees with the treatment plan.   [HM]  Wed Sep 05, 2016  0150 Discussed with Dr. Hal Hope who will admit  [HM]    Clinical Course User Index [HM] Jarrett Soho Athziry Millican, PA-C    Pt presents with Hypoxia, tachypnea and shortness of breath.  Patient with new oxygen requirement. Elevated BNP with evidence of acute on chronic heart failure. Will need admission for further diuresis and evaluation.  Final Clinical Impressions(s) / ED Diagnoses   Final diagnoses:  Hypoxia  SOB (shortness of breath)  Acute on chronic congestive heart failure, unspecified congestive heart failure type [facility])    New Prescriptions New Prescriptions   No medications on file    I personally performed the services described in this documentation, which was scribed in my presence. The recorded information has been reviewed and is accurate.     Jarrett Soho Essa Wenk, PA-C 09/05/16 [place], MD 09/07/16 1726

## 2016-09-05 ENCOUNTER — Encounter (HOSPITAL_COMMUNITY): Payer: Self-pay | Admitting: Internal Medicine

## 2016-09-05 DIAGNOSIS — J9601 Acute respiratory failure with hypoxia: Secondary | ICD-10-CM

## 2016-09-05 DIAGNOSIS — Z961 Presence of intraocular lens: Secondary | ICD-10-CM | POA: Diagnosis present

## 2016-09-05 DIAGNOSIS — Z9012 Acquired absence of left breast and nipple: Secondary | ICD-10-CM | POA: Diagnosis not present

## 2016-09-05 DIAGNOSIS — I5033 Acute on chronic diastolic (congestive) heart failure: Secondary | ICD-10-CM

## 2016-09-05 DIAGNOSIS — I10 Essential (primary) hypertension: Secondary | ICD-10-CM

## 2016-09-05 DIAGNOSIS — I482 Chronic atrial fibrillation, unspecified: Secondary | ICD-10-CM

## 2016-09-05 DIAGNOSIS — E118 Type 2 diabetes mellitus with unspecified complications: Secondary | ICD-10-CM

## 2016-09-05 DIAGNOSIS — I5043 Acute on chronic combined systolic (congestive) and diastolic (congestive) heart failure: Secondary | ICD-10-CM | POA: Diagnosis present

## 2016-09-05 DIAGNOSIS — Z90710 Acquired absence of both cervix and uterus: Secondary | ICD-10-CM | POA: Diagnosis not present

## 2016-09-05 DIAGNOSIS — Z794 Long term (current) use of insulin: Secondary | ICD-10-CM | POA: Diagnosis not present

## 2016-09-05 DIAGNOSIS — Z8551 Personal history of malignant neoplasm of bladder: Secondary | ICD-10-CM | POA: Diagnosis not present

## 2016-09-05 DIAGNOSIS — E039 Hypothyroidism, unspecified: Secondary | ICD-10-CM

## 2016-09-05 DIAGNOSIS — I110 Hypertensive heart disease with heart failure: Secondary | ICD-10-CM | POA: Diagnosis present

## 2016-09-05 DIAGNOSIS — Z87891 Personal history of nicotine dependence: Secondary | ICD-10-CM | POA: Diagnosis not present

## 2016-09-05 DIAGNOSIS — I509 Heart failure, unspecified: Secondary | ICD-10-CM

## 2016-09-05 DIAGNOSIS — G4733 Obstructive sleep apnea (adult) (pediatric): Secondary | ICD-10-CM

## 2016-09-05 DIAGNOSIS — Z885 Allergy status to narcotic agent status: Secondary | ICD-10-CM | POA: Diagnosis not present

## 2016-09-05 DIAGNOSIS — R0602 Shortness of breath: Secondary | ICD-10-CM | POA: Diagnosis not present

## 2016-09-05 DIAGNOSIS — Z9841 Cataract extraction status, right eye: Secondary | ICD-10-CM | POA: Diagnosis not present

## 2016-09-05 DIAGNOSIS — Z79899 Other long term (current) drug therapy: Secondary | ICD-10-CM | POA: Diagnosis not present

## 2016-09-05 DIAGNOSIS — I872 Venous insufficiency (chronic) (peripheral): Secondary | ICD-10-CM

## 2016-09-05 DIAGNOSIS — Z853 Personal history of malignant neoplasm of breast: Secondary | ICD-10-CM | POA: Diagnosis not present

## 2016-09-05 DIAGNOSIS — Z85038 Personal history of other malignant neoplasm of large intestine: Secondary | ICD-10-CM | POA: Diagnosis not present

## 2016-09-05 DIAGNOSIS — Z9842 Cataract extraction status, left eye: Secondary | ICD-10-CM | POA: Diagnosis not present

## 2016-09-05 DIAGNOSIS — I83009 Varicose veins of unspecified lower extremity with ulcer of unspecified site: Secondary | ICD-10-CM

## 2016-09-05 DIAGNOSIS — Z22322 Carrier or suspected carrier of Methicillin resistant Staphylococcus aureus: Secondary | ICD-10-CM

## 2016-09-05 DIAGNOSIS — Z6841 Body Mass Index (BMI) 40.0 and over, adult: Secondary | ICD-10-CM | POA: Diagnosis not present

## 2016-09-05 DIAGNOSIS — L97909 Non-pressure chronic ulcer of unspecified part of unspecified lower leg with unspecified severity: Secondary | ICD-10-CM | POA: Diagnosis present

## 2016-09-05 DIAGNOSIS — Z8543 Personal history of malignant neoplasm of ovary: Secondary | ICD-10-CM | POA: Diagnosis not present

## 2016-09-05 DIAGNOSIS — Z7901 Long term (current) use of anticoagulants: Secondary | ICD-10-CM | POA: Diagnosis not present

## 2016-09-05 DIAGNOSIS — Z888 Allergy status to other drugs, medicaments and biological substances status: Secondary | ICD-10-CM | POA: Diagnosis not present

## 2016-09-05 LAB — GLUCOSE, CAPILLARY
GLUCOSE-CAPILLARY: 94 mg/dL (ref 65–99)
GLUCOSE-CAPILLARY: 90 mg/dL (ref 65–99)
GLUCOSE-CAPILLARY: 91 mg/dL (ref 65–99)
Glucose-Capillary: 84 mg/dL (ref 65–99)

## 2016-09-05 LAB — CBC
HEMATOCRIT: 38.2 % (ref 36.0–46.0)
HEMOGLOBIN: 11.9 g/dL — AB (ref 12.0–15.0)
MCH: 29.7 pg (ref 26.0–34.0)
MCHC: 31.2 g/dL (ref 30.0–36.0)
MCV: 95.3 fL (ref 78.0–100.0)
Platelets: 139 10*3/uL — ABNORMAL LOW (ref 150–400)
RBC: 4.01 MIL/uL (ref 3.87–5.11)
RDW: 17.9 % — ABNORMAL HIGH (ref 11.5–15.5)
WBC: 4.9 10*3/uL (ref 4.0–10.5)

## 2016-09-05 LAB — URINALYSIS, ROUTINE W REFLEX MICROSCOPIC
Bilirubin Urine: NEGATIVE
Glucose, UA: NEGATIVE mg/dL
HGB URINE DIPSTICK: NEGATIVE
KETONES UR: NEGATIVE mg/dL
Leukocytes, UA: NEGATIVE
Nitrite: NEGATIVE
PROTEIN: NEGATIVE mg/dL
Specific Gravity, Urine: 1.008 (ref 1.005–1.030)
pH: 7 (ref 5.0–8.0)

## 2016-09-05 LAB — COMPREHENSIVE METABOLIC PANEL
ALT: 8 U/L — ABNORMAL LOW (ref 14–54)
AST: 16 U/L (ref 15–41)
Albumin: 3.4 g/dL — ABNORMAL LOW (ref 3.5–5.0)
Alkaline Phosphatase: 47 U/L (ref 38–126)
Anion gap: 9 (ref 5–15)
BUN: 23 mg/dL — ABNORMAL HIGH (ref 6–20)
CHLORIDE: 102 mmol/L (ref 101–111)
CO2: 33 mmol/L — ABNORMAL HIGH (ref 22–32)
CREATININE: 0.92 mg/dL (ref 0.44–1.00)
Calcium: 9.1 mg/dL (ref 8.9–10.3)
GFR, EST AFRICAN AMERICAN: 60 mL/min — AB (ref >60)
GFR, EST NON AFRICAN AMERICAN: 52 mL/min — AB (ref >60)
Glucose, Bld: 100 mg/dL — ABNORMAL HIGH (ref 65–99)
POTASSIUM: 3.6 mmol/L (ref 3.5–5.1)
Sodium: 144 mmol/L (ref 135–145)
TOTAL PROTEIN: 6.1 g/dL — AB (ref 6.5–8.1)
Total Bilirubin: 1 mg/dL (ref 0.3–1.2)

## 2016-09-05 LAB — CBC WITH DIFFERENTIAL/PLATELET
Basophils Absolute: 0 10*3/uL (ref 0.0–0.1)
Basophils Relative: 0 %
EOS PCT: 3 %
Eosinophils Absolute: 0.2 10*3/uL (ref 0.0–0.7)
HCT: 39 % (ref 36.0–46.0)
Hemoglobin: 12.3 g/dL (ref 12.0–15.0)
LYMPHS ABS: 1 10*3/uL (ref 0.7–4.0)
LYMPHS PCT: 20 %
MCH: 30.1 pg (ref 26.0–34.0)
MCHC: 31.5 g/dL (ref 30.0–36.0)
MCV: 95.6 fL (ref 78.0–100.0)
MONO ABS: 0.7 10*3/uL (ref 0.1–1.0)
MONOS PCT: 14 %
Neutro Abs: 3.2 10*3/uL (ref 1.7–7.7)
Neutrophils Relative %: 63 %
PLATELETS: 135 10*3/uL — AB (ref 150–400)
RBC: 4.08 MIL/uL (ref 3.87–5.11)
RDW: 18.2 % — AB (ref 11.5–15.5)
WBC: 5 10*3/uL (ref 4.0–10.5)

## 2016-09-05 LAB — MRSA PCR SCREENING: MRSA BY PCR: POSITIVE — AB

## 2016-09-05 LAB — I-STAT TROPONIN, ED: TROPONIN I, POC: 0 ng/mL (ref 0.00–0.08)

## 2016-09-05 LAB — MAGNESIUM: Magnesium: 2.1 mg/dL (ref 1.7–2.4)

## 2016-09-05 LAB — PROCALCITONIN: Procalcitonin: <0.1 ng/mL

## 2016-09-05 LAB — BRAIN NATRIURETIC PEPTIDE: B Natriuretic Peptide: 305.5 pg/mL — ABNORMAL HIGH (ref 0.0–100.0)

## 2016-09-05 MED ORDER — LISINOPRIL 5 MG PO TABS
5.0000 mg | ORAL_TABLET | Freq: Every day | ORAL | Status: DC
  Administered 2016-09-05 – 2016-09-06 (×2): 5 mg via ORAL
  Filled 2016-09-05 (×2): qty 1

## 2016-09-05 MED ORDER — DILTIAZEM HCL ER COATED BEADS 360 MG PO CP24
360.0000 mg | ORAL_CAPSULE | Freq: Every day | ORAL | Status: DC
  Administered 2016-09-05 – 2016-09-06 (×2): 360 mg via ORAL
  Filled 2016-09-05: qty 1
  Filled 2016-09-05: qty 2
  Filled 2016-09-05: qty 1
  Filled 2016-09-05: qty 2

## 2016-09-05 MED ORDER — CHLORHEXIDINE GLUCONATE CLOTH 2 % EX PADS
6.0000 | MEDICATED_PAD | Freq: Every day | CUTANEOUS | Status: DC
  Administered 2016-09-05 – 2016-09-06 (×2): 6 via TOPICAL

## 2016-09-05 MED ORDER — BISACODYL 5 MG PO TBEC
5.0000 mg | DELAYED_RELEASE_TABLET | Freq: Every day | ORAL | Status: DC
  Administered 2016-09-05 – 2016-09-06 (×2): 5 mg via ORAL
  Filled 2016-09-05 (×2): qty 1

## 2016-09-05 MED ORDER — FUROSEMIDE 10 MG/ML IJ SOLN
40.0000 mg | Freq: Once | INTRAMUSCULAR | Status: DC
  Administered 2016-09-05: 40 mg via INTRAVENOUS

## 2016-09-05 MED ORDER — ONDANSETRON HCL 4 MG/2ML IJ SOLN: 4.0000 mg | Freq: Four times a day (QID) | INTRAMUSCULAR | Status: DC | PRN

## 2016-09-05 MED ORDER — ACETAMINOPHEN 325 MG PO TABS: 650.0000 mg | ORAL_TABLET | Freq: Four times a day (QID) | ORAL | Status: DC | PRN

## 2016-09-05 MED ORDER — ACETAMINOPHEN 650 MG RE SUPP: 650.0000 mg | Freq: Four times a day (QID) | RECTAL | Status: DC | PRN

## 2016-09-05 MED ORDER — BISACODYL 10 MG RE SUPP: 10.0000 mg | RECTAL | Status: DC | PRN

## 2016-09-05 MED ORDER — ONDANSETRON HCL 4 MG PO TABS: 4.0000 mg | ORAL_TABLET | Freq: Four times a day (QID) | ORAL | Status: DC | PRN

## 2016-09-05 MED ORDER — MUPIROCIN 2 % EX OINT
1.0000 "application " | TOPICAL_OINTMENT | Freq: Two times a day (BID) | CUTANEOUS | Status: DC
  Administered 2016-09-05 – 2016-09-06 (×3): 1 via NASAL
  Filled 2016-09-05: qty 22

## 2016-09-05 MED ORDER — APIXABAN 5 MG PO TABS
5.0000 mg | ORAL_TABLET | Freq: Two times a day (BID) | ORAL | Status: DC
  Administered 2016-09-05 – 2016-09-06 (×3): 5 mg via ORAL
  Filled 2016-09-05 (×3): qty 1

## 2016-09-05 MED ORDER — FERROUS SULFATE 325 (65 FE) MG PO TABS
325.0000 mg | ORAL_TABLET | Freq: Every day | ORAL | Status: DC
  Administered 2016-09-05 – 2016-09-06 (×2): 325 mg via ORAL
  Filled 2016-09-05 (×2): qty 1

## 2016-09-05 MED ORDER — INSULIN ASPART 100 UNIT/ML ~~LOC~~ SOLN: 0.0000 [IU] | Freq: Three times a day (TID) | SUBCUTANEOUS | Status: DC

## 2016-09-05 MED ORDER — LEVOTHYROXINE SODIUM 125 MCG PO TABS
125.0000 ug | ORAL_TABLET | Freq: Every day | ORAL | Status: DC
  Administered 2016-09-06: 125 ug via ORAL
  Filled 2016-09-05 (×2): qty 1

## 2016-09-05 MED ORDER — SENNOSIDES-DOCUSATE SODIUM 8.6-50 MG PO TABS
1.0000 | ORAL_TABLET | Freq: Two times a day (BID) | ORAL | Status: DC
  Administered 2016-09-05 – 2016-09-06 (×3): 1 via ORAL
  Filled 2016-09-05 (×3): qty 1

## 2016-09-05 MED ORDER — POTASSIUM CHLORIDE CRYS ER 20 MEQ PO TBCR
20.0000 meq | EXTENDED_RELEASE_TABLET | Freq: Every day | ORAL | Status: DC
  Administered 2016-09-05 – 2016-09-06 (×2): 20 meq via ORAL
  Filled 2016-09-05 (×2): qty 1

## 2016-09-05 MED ORDER — FUROSEMIDE 10 MG/ML IJ SOLN
40.0000 mg | Freq: Two times a day (BID) | INTRAMUSCULAR | Status: DC
  Administered 2016-09-05 – 2016-09-06 (×3): 40 mg via INTRAVENOUS
  Filled 2016-09-05 (×4): qty 4

## 2016-09-05 NOTE — ED Notes (Signed)
Attempted IV stick x 2  

## 2016-09-05 NOTE — ED Notes (Signed)
DAUGHTER CHRIS PETERSON [phone] CALL WITH UPDATES

## 2016-09-05 NOTE — H&P (Signed)
History and Physical    [name] [id number] DOB: [birth year] DOA: 09/04/2016  PCP: [name], MD  Patient coming from: Assisted living.  Chief Complaint: Shortness of breath and hypoxia.  HPI: [name] is a 83 y.o. female with admitted last month for pneumonia and CHF was referred from assisted living or shortness of breath and hypoxia. Patient was admitted last month for pneumonia and eventually discharged to rehabilitation and back to assisted living last 8 days started having exertional shortness of breath and was becoming hypoxic last few days. Patient initially had discharge last month was on 3 L oxygen and was eventually weaned off.  ED Course - in the ER patient is requiring 4 L oxygen. Chest x-ray shows right-sided opacity concerning for layering of effusion versus infiltrates. There was also left pleural effusion. BNP is around 300 more than what patient was during last admission. Patient does not have any chest pain or productive cough suggesting of pneumonia. Patient is being admitted for CHF and was given Lasix. Patient also has bilateral lower extremity UNNA boots so unable to assess peripheral edema. JVD is elevated.  Review of Systems: As per HPI, rest all negative.   Past Medical History:  Diagnosis Date  . Bladder cancer ([place])   . Breast cancer ([place])   . Cervical cancer ([place])   . Chronic diastolic heart failure ([place])    a. Echo 5/12: Mild LVH, EF 55-60%, mild AI, mild MR, severe LAE, mild RAE, PASP 31, small pericardial effusion  . Colon cancer ([place])    "polyp" (12/08/2012)  . Diastolic CHF, chronic ([place]) 11/11/2012   Class 2b-3 2 d echo 5/12 mild LVH EF 55-50%, MILD AI, MILD MR, SEVERE LAE, mild RAE, PASP 31, SMALL PERICRADIAL EFFUSION  . DM2 (diabetes mellitus, type 2) ([place])    TYPE 2  . H/O ovarian cancer 11/11/2012    MUCINOUS CYST S/P RESECTION 35 YEARS AGO  . HLD (hyperlipidemia)   . Hx of bladder cancer 11/11/2012  . Hx of cervical cancer  11/11/2012  . Hypertension   . Hypothyroidism   . OSA (obstructive sleep apnea)   . Ovarian cancer ([place])   . Permanent atrial fibrillation (HCC)    a. coumadin d/c'd => Pradaxa in 05/2012  . S/P hysterectomy 11/11/2012  . SBO (small bowel obstruction)   . SBO (small bowel obstruction) 08/25/2014  . Venous insufficiency    chronic LE edema    Past Surgical History:  Procedure Laterality Date  . APPENDECTOMY    . BLADDER SURGERY    . BREAST BIOPSY Bilateral   . BREAST LUMPECTOMY Right   . CATARACT EXTRACTION W/ INTRAOCULAR LENS  IMPLANT, BILATERAL    . EXPLORATORY LAPAROTOMY WITH ABDOMINAL MASS EXCISION     "21# ovarian tumor; benign" (12/08/2012)  . I&D EXTREMITY Right 12/31/2012   Procedure: IRRIGATION AND DEBRIDEMENT RIGHT KNEE ULCER WITH PLACEMENT OF A CELL AND VAC ;  Surgeon: Theodoro Kos, DO;  Location: [facility];  Service: Plastics;  Laterality: Right;  . MASTECTOMY, RADICAL Left   . VAGINAL HYSTERECTOMY       reports that she quit smoking about 50 years ago. Her smoking use included Cigarettes. She has a 75.00 pack-year smoking history. She has never used smokeless tobacco. She reports that she does not drink alcohol or use drugs.  Allergies  Allergen Reactions  . Morphine And Related Other (See Comments)    sick  . Statins Other (See Comments)    sick  .  Zetia [Ezetimibe] Other (See Comments)    Side effect too strong   . Atorvastatin     Other reaction(s): Other (See Comments) Leg swelling  . Morphine     Other reaction(s): Vomiting (intolerance)  . Codeine Other (See Comments)    unknown    Family History  Problem Relation Age of Onset  . Hypertension Other     Prior to Admission medications   Medication Sig Start Date End Date Taking? Authorizing Provider  apixaban (ELIQUIS) 5 MG TABS tablet Take 1 tablet (5 mg total) by mouth 2 (two) times daily. 01/18/14  Yes Thompson Grayer, MD  bisacodyl (DULCOLAX) 10 MG suppository Place 10 mg rectally as needed for moderate  constipation.   Yes Historical Provider, MD  bisacodyl (DULCOLAX) 5 MG EC tablet Take 5 mg by mouth daily.    Yes Historical Provider, MD  clobetasol cream (TEMOVATE) 0.62 % Apply 1 application topically daily as needed (rash).   Yes Historical Provider, MD  Cyanocobalamin (VITAMIN B-12 IJ) Inject 1,000 Units as directed every 30 (thirty) days.   Yes Historical Provider, MD  diltiazem (CARDIZEM CD) 120 MG 24 hr capsule Take 1 capsule (120 mg total) by mouth daily. Patient taking differently: Take 360 mg by mouth daily.  08/09/16  Yes Eugenie Filler, MD  ferrous sulfate 325 (65 FE) MG tablet Take 325 mg by mouth daily with breakfast.   Yes Historical Provider, MD  furosemide (LASIX) 40 MG tablet Take 1.5 tablets (60 mg total) by mouth daily. Take 1.5 tablets(60mg ) daily starting 08/17/2015. 08/16/16  Yes Eugenie Filler, MD  furosemide (LASIX) 40 MG tablet Take 40 mg by mouth daily as needed for fluid.   Yes Historical Provider, MD  levothyroxine (SYNTHROID, LEVOTHROID) 125 MCG tablet Take 125 mcg by mouth daily before breakfast.   Yes Historical Provider, MD  lisinopril (PRINIVIL,ZESTRIL) 5 MG tablet Take 5 mg by mouth daily.   Yes Historical Provider, MD  potassium chloride SA (K-DUR,KLOR-CON) 20 MEQ tablet Take 20 mEq by mouth daily.   Yes Historical Provider, MD  senna-docusate (SENOKOT-S) 8.6-50 MG per tablet Take 1 tablet by mouth 2 (two) times daily. 08/31/14  Yes Geradine Girt, DO  sodium phosphate (FLEET) 7-19 GM/118ML ENEM Place 133 mLs (1 enema total) rectally daily as needed for severe constipation. 08/31/14  Yes Jessica U Vann, DO  albuterol (PROVENTIL) (2.5 MG/3ML) 0.083% nebulizer solution Take 3 mLs (2.5 mg total) by nebulization every 4 (four) hours as needed for wheezing or shortness of breath. Patient not taking: Reported on 09/05/2016 08/07/16   Dron Tanna Furry, MD  insulin aspart (NOVOLOG) 100 UNIT/ML injection Inject 0-15 Units into the skin 3 (three) times daily with  meals. Patient not taking: Reported on 09/05/2016 08/07/16   Rosita Fire, MD    Physical Exam: Vitals:   09/04/16 2224 09/04/16 2229 09/04/16 2230 09/04/16 2300  BP: 117/76  139/94 (!) 149/104  Pulse: 96  101 103  Resp: 25     Temp: 97.6 F (36.4 C)     TempSrc: Oral     SpO2: 94% 98% 95% 97%  Weight:  98.4 kg (217 lb)    Height:  5\' 1"  (1.549 m)        ConstConstitutional - Moderately built and nourished. Vitals:   09/04/16 2224 09/04/16 2229 09/04/16 2230 09/04/16 2300  BP: 117/76  139/94 (!) 149/104  Pulse: 96  101 103  Resp: 25     Temp: 97.6 F (36.4 C)  TempSrc: Oral     SpO2: 94% 98% 95% 97%  Weight:  98.4 kg (217 lb)    Height:  5\' 1"  (1.549 m)     Eyes: Anicteric no pallor. ENMT: No discharge from the ears eyes nose and mouth. Neck: JVD elevated. Respiratory: No rhonchi basal crepitations. Cardiovascular: S1-S2 no murmurs appreciated. Abdomen: Soft nontender bowel sounds present. Musculoskeletal: Bilateral lower extremity dressing. Skin: Bilateral lower extremity dressing. Neurologic: Alert awake oriented to time place and person. Moves all extremities. Psychiatric: Appears normal. Normal affect.   Labs on Admission: I have personally reviewed following labs and imaging studies  CBC:  Recent Labs Lab 09/05/16 0019  WBC 5.0  NEUTROABS 3.2  HGB 12.3  HCT 39.0  MCV 95.6  PLT 226*   Basic Metabolic Panel:  Recent Labs Lab 09/05/16 0019  NA 144  K 3.6  CL 102  CO2 33*  GLUCOSE 100*  BUN 23*  CREATININE 0.92  CALCIUM 9.1   GFR: Estimated Creatinine Clearance: 40.1 mL/min (by C-G formula based on SCr of 0.92 mg/dL). Liver Function Tests:  Recent Labs Lab 09/05/16 0019  AST 16  ALT 8*  ALKPHOS 47  BILITOT 1.0  PROT 6.1*  ALBUMIN 3.4*   No results for input(s): LIPASE, AMYLASE in the last 168 hours. No results for input(s): AMMONIA in the last 168 hours. Coagulation Profile: No results for input(s): INR, PROTIME in the  last 168 hours. Cardiac Enzymes: No results for input(s): CKTOTAL, CKMB, CKMBINDEX, TROPONINI in the last 168 hours. BNP (last 3 results) No results for input(s): PROBNP in the last 8760 hours. HbA1C: No results for input(s): HGBA1C in the last 72 hours. CBG: No results for input(s): GLUCAP in the last 168 hours. Lipid Profile: No results for input(s): CHOL, HDL, LDLCALC, TRIG, CHOLHDL, LDLDIRECT in the last 72 hours. Thyroid Function Tests: No results for input(s): TSH, T4TOTAL, FREET4, T3FREE, THYROIDAB in the last 72 hours. Anemia Panel: No results for input(s): VITAMINB12, FOLATE, FERRITIN, TIBC, IRON, RETICCTPCT in the last 72 hours. Urine analysis:    Component Value Date/Time   COLORURINE AMBER (A) 07/19/2015 1359   APPEARANCEUR TURBID (A) 07/19/2015 1359   LABSPEC >1.046 (H) 07/19/2015 1359   PHURINE 6.5 07/19/2015 1359   GLUCOSEU NEGATIVE 07/19/2015 1359   HGBUR MODERATE (A) 07/19/2015 1359   BILIRUBINUR NEGATIVE 07/19/2015 1359   KETONESUR NEGATIVE 07/19/2015 1359   PROTEINUR 100 (A) 07/19/2015 1359   UROBILINOGEN 0.2 08/25/2014 1246   NITRITE NEGATIVE 07/19/2015 1359   LEUKOCYTESUR LARGE (A) 07/19/2015 1359   Sepsis Labs: @LABRCNTIP (procalcitonin:4,lacticidven:4) )No results found for this or any previous visit (from the past 240 hour(s)).   Radiological Exams on Admission: Dg Chest 2 View  Result Date: 09/05/2016 CLINICAL DATA:  Wheezing shortness of breath and hypoxia EXAM: CHEST  2 VIEW COMPARISON:  08/30/2016 FINDINGS: Small bilateral pleural effusions, increased on the right side and probably layering. Moderate severe cardiomegaly with central vascular congestion. Atelectasis left lung base. Hazy opacity, new compared to prior, within the right lung base, may relate to combination of layering effusion, edema and or infiltrate. No pneumothorax. Sclerotic lesion in the proximal left humerus unchanged IMPRESSION: 1. Cardiomegaly. 2. Increased hazy opacity in the  right lower lung, could relate to layering pleural effusion although difficult to exclude asymmetric edema or infiltrates 3. Small left pleural effusion. Electronically Signed   By: Donavan Foil M.D.   On: 09/05/2016 00:14    EKG: Independently reviewed. A. fib rate controlled.  Assessment/Plan Active  Problems:   Hypothyroidism   Acute on chronic diastolic CHF (congestive heart failure) (HCC)   OSA (obstructive sleep apnea)   Diabetes mellitus with complication (HCC)   Acute on chronic congestive heart failure ([place])    1. Acute on chronic diastolic CHF last EF measured was 55 to 60% in January 2017 - patient's exact weight is not known during last admission. At this time patient is weighing about 98 kg. I have placed patient on Lasix 40 mg IV every 12. Closely follow daily weights intake output. Chest x-ray shows possibility of infiltrates but patient does not have any cough. Check procalcitonin levels. Patient is on lisinopril. 2. Chronic atrial fibrillation - patient is on Apixaban Cardizem. Rate is controlled. Chads2 vasc score is more than 2. 3. Diabetes mellitus type 2 on sliding scale coverage. 4. Hypothyroidism on Synthroid.   DVT prophylaxis: Apixaban. Code Status: DO NOT RESUSCITATE.  Family Communication: No family at the bedside.  Disposition Plan: To be determined.  Consults called: None.  Admission status: Inpatient.    Rise Patience MD [facility] Pager [phone].  If 7PM-7AM, please contact night-coverage [email or web address] Password [email or web address]  09/05/2016, 3:13 AM

## 2016-09-05 NOTE — Progress Notes (Signed)
Progress Note    [name]  NFA:[id number] DOB: 1919-10-28  DOA: 09/04/2016 PCP: [name], MD    Brief Narrative:   Chief complaint: Follow-up CHF exacerbation.  [name] is an 83 y.o. female with a PMH of chronic atrial fibrillation on apixiban, history of breast cancer and colon cancer, type 2 diabetes, OSA, hypothyroidism, and recent admission to the hospital 07/30/16-08/08/16 where she was treated for acute respiratory failure with hypoxia secondary to pneumonia, discharged to a SNF on oxygen who was readmitted 09/04/16 with worsening oxygen saturation accompanied by fatigue and shortness of breath. On admission, BNP was 305.5, and chest x-ray showed a small left pleural effusion, cardiomegaly, and increased hazy opacity in the right lower lung. Last 2-D echo done 07/20/15 showed an EF of 55-60 percent with moderate left ear, moderate MR, and mild TR with moderately elevated pulmonary pressure.  Assessment/Plan:   Principal Problem:   Acute respiratory failure with hypoxia/Acute on chronic diastolic CHF (congestive heart failure) (HCC) Lasix 40 mg IV every 12 hours ordered on admission, appears to be breathing better. Continue supplemental oxygen. Screening troponin negative. Continue supplemental oxygen.Chest x-ray personally reviewed. Clearly shows right sided changes consistent with asymmetric pulmonary edema versus infiltrate when compared to films done 08/30/16. Suspect this is edema rather than infection. Continue Lasix. Active Problems:   Hypothyroidism Continue Synthroid.    OSA (obstructive sleep apnea) Refuses CPAP.    Essential hypertension Continue Cardizem and Lasix.    Diabetes mellitus with complication (HCC) Currently being managed with insulin sensitive SSI. Follow CBGs and adjust as needed to maintain euglycemia.    Chronic atrial fibrillation (HCC) Anticoagulated and on Cardizem for rate control. Heart rate reasonable.    Severe obesity (BMI  >= 40) (HCC) Body mass index is 40.86 kg/m.    Chronic venous stasis/cutaneous venous stasis ulcer Wound care nurse to inspect lower extremities and reapply Unna boots.    MRSA carrier Contact precautions. Decontamination therapy ordered.  Family Communication/Anticipated D/C date and plan/Code Status   DVT prophylaxis: On Apixiban. Code Status: Full Code.  Family Communication: Daughter updated at the bedside. Disposition Plan: Anticipate discharge to [facility] 09/06/16.   Medical Consultants:    None.   Procedures:    None  Anti-Infectives:    None  Subjective:   Last BM was last night.  Feels much better. Sitting up in chair.  Objective:    Vitals:   09/05/16 0430 09/05/16 0547 09/05/16 1006 09/05/16 1405  BP: 127/96 124/77 126/78 (!) 120/56  Pulse: (!) 58 97  84  Resp: (!) 27 (!) 21  19  Temp:  98 F (36.7 C)    TempSrc:  Oral    SpO2: 90% 93%  93%  Weight:  98.1 kg (216 lb 4.3 oz)    Height:  5\' 1"  (1.549 m)      Intake/Output Summary (Last 24 hours) at 09/05/16 1508 Last data filed at 09/05/16 1207  Gross per 24 hour  Intake              200 ml  Output             1250 ml  Net            -1050 ml   Filed Weights   09/04/16 2229 09/05/16 0547  Weight: 98.4 kg (217 lb) 98.1 kg (216 lb 4.3 oz)    Exam: General exam: Appears calm and comfortable. Sitting up in chair. Respiratory system: Decreased  breath sounds right base with a few crackles. Respiratory effort mildly increased. Cardiovascular system: HSIR. No JVD,  rubs, gallops or clicks. II/VI murmur. Gastrointestinal system: Abdomen is nondistended, soft and nontender. No organomegaly or masses felt. Normal bowel sounds heard. Central nervous system: Alert. No focal neurological deficits. Extremities: Unna boots bilateral lower extremities.. Skin: Venous stasis changes to the feet. Psychiatry: Judgement and insight appear normal. Mood & affect appropriate.   Data Reviewed:   I have  personally reviewed following labs and imaging studies:  Labs: Basic Metabolic Panel:  Recent Labs Lab 09/05/16 0019 09/05/16 0344  NA 144  --   K 3.6  --   CL 102  --   CO2 33*  --   GLUCOSE 100*  --   BUN 23*  --   CREATININE 0.92  --   CALCIUM 9.1  --   MG  --  2.1   GFR Estimated Creatinine Clearance: 40.1 mL/min (by C-G formula based on SCr of 0.92 mg/dL). Liver Function Tests:  Recent Labs Lab 09/05/16 0019  AST 16  ALT 8*  ALKPHOS 47  BILITOT 1.0  PROT 6.1*  ALBUMIN 3.4*   CBC:  Recent Labs Lab 09/05/16 0019 09/05/16 0344  WBC 5.0 4.9  NEUTROABS 3.2  --   HGB 12.3 11.9*  HCT 39.0 38.2  MCV 95.6 95.3  PLT 135* 139*   CBG:  Recent Labs Lab 09/05/16 0558 09/05/16 1132  GLUCAP 94 90   Sepsis Labs:  Recent Labs Lab 09/05/16 0019 09/05/16 0344 09/05/16 0745  PROCALCITON  --   --  <0.10  WBC 5.0 4.9  --     Microbiology Recent Results (from the past 240 hour(s))  MRSA PCR Screening     Status: Abnormal   Collection Time: 09/05/16  6:01 AM  Result Value Ref Range Status   MRSA by PCR POSITIVE (A) NEGATIVE Final    Comment:        The GeneXpert MRSA Assay (FDA approved for NASAL specimens only), is one component of a comprehensive MRSA colonization surveillance program. It is not intended to diagnose MRSA infection nor to guide or monitor treatment for MRSA infections. RESULT CALLED TO, READ BACK BY AND VERIFIED WITH: Gertie Exon [id number] 03.07.2018 N. MORRIS     Radiology: Dg Chest 2 View  Result Date: 09/05/2016 CLINICAL DATA:  Wheezing shortness of breath and hypoxia EXAM: CHEST  2 VIEW COMPARISON:  08/30/2016 FINDINGS: Small bilateral pleural effusions, increased on the right side and probably layering. Moderate severe cardiomegaly with central vascular congestion. Atelectasis left lung base. Hazy opacity, new compared to prior, within the right lung base, may relate to combination of layering effusion, edema and or infiltrate. No  pneumothorax. Sclerotic lesion in the proximal left humerus unchanged IMPRESSION: 1. Cardiomegaly. 2. Increased hazy opacity in the right lower lung, could relate to layering pleural effusion although difficult to exclude asymmetric edema or infiltrates 3. Small left pleural effusion. Electronically Signed   By: Donavan Foil M.D.   On: 09/05/2016 00:14    Medications:   . apixaban  5 mg Oral BID  . bisacodyl  5 mg Oral Daily  . Chlorhexidine Gluconate Cloth  6 each Topical Q0600  . diltiazem  360 mg Oral Daily  . ferrous sulfate  325 mg Oral Q breakfast  . furosemide  40 mg Intravenous Q12H  . insulin aspart  0-9 Units Subcutaneous TID WC  . levothyroxine  125 mcg Oral QAC breakfast  . lisinopril  5 mg Oral Daily  . mupirocin ointment  1 application Nasal BID  . potassium chloride SA  20 mEq Oral Daily  . senna-docusate  1 tablet Oral BID   Continuous Infusions:  No charge, patient admitted after midnight.  LOS: 0 days   Garfield Coiner  [facility] Pager [phone]. If unable to reach me by pager, please call my cell phone at [phone].  *Please refer to [email or web address], password [email or web address] to get updated schedule on who will round on this patient, as hospitalists switch teams weekly. If 7PM-7AM, please contact night-coverage at [email or web address], password [email or web address] for any overnight needs.  09/05/2016, 3:08 PM

## 2016-09-05 NOTE — Consult Note (Signed)
[place] Nurse wound consult note Reason for Consult: buttocks and bilateral Unna's boots Discussed LE compression with patient, she reports that they are changed 1x wk by her [facility] that comes to the [place] where she resides. She can not recall exact date last changed but that it has been 4-5 days.  She reports only using Unna's boots for compression for edema control. No open ulcers or weeping.  Wound type: Buttocks, healed area left buttock, discolored but not open. Some redness of the buttocks but blanches Dressing procedure/placement/frequency: Continue Unna's boots bilaterally change 1x wk on Wednesdays.  Bedside nurse to notify orthopedic tech when [facility]an arrives to the department.   Barrier cream to the buttocks to protect, apply at least each shift.  Discussed POC with patient and bedside nurse.  Re consult if needed, will not follow at this time. Thanks  Cassadie Pankonin [employer], RN,CWOCN, CNS [phone])

## 2016-09-05 NOTE — Progress Notes (Signed)
Orthopedic Tech Progress Note Patient Details:  [name] January 21, 1920 [id number]  Ortho Devices Type of Ortho Device: Louretta Parma boot Ortho Device/Splint Location: bilateral Ortho Device/Splint Interventions: Application   Hildred Priest 09/05/2016, 1:52 PM

## 2016-09-06 LAB — URINALYSIS, ROUTINE W REFLEX MICROSCOPIC
Bilirubin Urine: NEGATIVE
Glucose, UA: NEGATIVE mg/dL
Hgb urine dipstick: NEGATIVE
Ketones, ur: NEGATIVE mg/dL
Leukocytes, UA: NEGATIVE
NITRITE: NEGATIVE
PH: 5 (ref 5.0–8.0)
Protein, ur: NEGATIVE mg/dL
SPECIFIC GRAVITY, URINE: 1.013 (ref 1.005–1.030)

## 2016-09-06 LAB — GLUCOSE, CAPILLARY
Glucose-Capillary: 91 mg/dL (ref 65–99)
Glucose-Capillary: 93 mg/dL (ref 65–99)

## 2016-09-06 LAB — BASIC METABOLIC PANEL
Anion gap: 8 (ref 5–15)
BUN: 18 mg/dL (ref 6–20)
CALCIUM: 8.6 mg/dL — AB (ref 8.9–10.3)
CO2: 36 mmol/L — ABNORMAL HIGH (ref 22–32)
Chloride: 99 mmol/L — ABNORMAL LOW (ref 101–111)
Creatinine, Ser: 0.75 mg/dL (ref 0.44–1.00)
GFR calc Af Amer: >60 mL/min (ref >60)
GLUCOSE: 99 mg/dL (ref 65–99)
POTASSIUM: 3.6 mmol/L (ref 3.5–5.1)
Sodium: 143 mmol/L (ref 135–145)

## 2016-09-06 MED ORDER — FUROSEMIDE 40 MG PO TABS: 60.0000 mg | ORAL_TABLET | Freq: Two times a day (BID) | ORAL | 0 refills | Status: DC

## 2016-09-06 MED ORDER — POTASSIUM CHLORIDE CRYS ER 20 MEQ PO TBCR: 20.0000 meq | EXTENDED_RELEASE_TABLET | Freq: Two times a day (BID) | ORAL | Status: AC

## 2016-09-06 MED ORDER — UNABLE TO FIND: Status: AC

## 2016-09-06 MED ORDER — DILTIAZEM HCL ER COATED BEADS 120 MG PO CP24: 360.0000 mg | ORAL_CAPSULE | Freq: Every day | ORAL | Status: AC

## 2016-09-06 NOTE — Discharge Summary (Signed)
Physician Discharge Summary  [name] ZOX:[id number] DOB: 1919/10/22 DOA: 09/04/2016  PCP: [name], MD  Admit date: 09/04/2016 Discharge date: 09/06/2016  Admitted From: [facility] Discharge disposition: [facility]   Recommendations for Outpatient Follow-Up:   1. Needs close F/U with PCP to ensure that her heart failure remains well compensated.  Lasix adjusted.  Would re-check electrolytes in 3 days to ensure potassium remains WNL.   Discharge Diagnosis:   Principal Problem:    Acute on chronic diastolic CHF (congestive heart failure) (HCC) Active Problems:    Permanent atrial fibrillation (HCC)    Nausea & vomiting    Hypothyroidism    OSA (obstructive sleep apnea)    Essential hypertension    Acute respiratory failure with hypoxia (HCC)    Diabetes mellitus with complication (HCC)    Chronic cutaneous venous stasis ulcer (HCC)    Severe obesity (BMI >= 40) (HCC)    Chronic venous stasis dermatitis of both lower extremities    MRSA carrier   Discharge Condition: Improved.  Diet recommendation: Low sodium, heart healthy.  Carbohydrate-modified.    History of Present Illness:   [name] is an 83 y.o. female with a PMH of chronic atrial fibrillation on apixiban, history of breast cancer and colon cancer, type 2 diabetes, OSA, hypothyroidism, and recent admission to the hospital 07/30/16-08/08/16 where she was treated for acute respiratory failure with hypoxia secondary to pneumonia, discharged to a SNF on oxygen who was readmitted 09/04/16 with worsening oxygen saturation accompanied by fatigue and shortness of breath. On admission, BNP was 305.5, and chest x-ray showed a small left pleural effusion, cardiomegaly, and increased hazy opacity in the right lower lung. Last 2-D echo done 07/20/15 showed an EF of 55-60 percent with moderate left ear, moderate MR, and mild TR with moderately elevated pulmonary pressure.   Hospital Course by Problem:    Principal Problem:   Acute respiratory failure with hypoxia/Acute on chronic diastolic CHF (congestive heart failure) (HCC) Lasix 40 mg IV every 12 hours ordered on admission. Continue supplemental oxygen. Screening troponin negative. Chest x-ray personally reviewed. Clearly shows right sided changes consistent with asymmetric pulmonary edema versus infiltrate when compared to films done 08/30/16. Suspect this is edema rather than infection. I/O balance -900 mL. Weight down 11 pounds. Discharge on Lasix 60 mg PO BID, respiratory status much improved at discharge.   Active Problems:   Hypothyroidism Continue Synthroid.    OSA (obstructive sleep apnea) Refuses CPAP.    Essential hypertension Continue Cardizem and Lasix.    Diabetes mellitus with complication (HCC) Currently being managed with insulin sensitive SSI. CBGs well-controlled at 84-94. OK to resume home regimen at discharge.    Chronic atrial fibrillation (HCC) Anticoagulated and on Cardizem for rate control. Heart rate reasonable.    Severe obesity (BMI >= 40) (HCC) Body mass index is 40.86 kg/m.    Chronic venous stasis/cutaneous venous stasis ulcer Continue Unna boots.    MRSA carrier Contact precautions. Decontamination therapy provided.  Medical Consultants:    None.   Discharge Exam:   Vitals:   09/05/16 2100 09/06/16 0514  BP: (!) 142/78 124/62  Pulse: 84 83  Resp: 18 18  Temp: 97.3 F (36.3 C) 97.4 F (36.3 C)   Vitals:   09/05/16 1006 09/05/16 1405 09/05/16 2100 09/06/16 0514  BP: 126/78 (!) 120/56 (!) 142/78 124/62  Pulse:  84 84 83  Resp:  19 18 18   Temp:   97.3 F (36.3 C) 97.4 F (36.3  C)  TempSrc:   Oral Oral  SpO2:  93% 100% 100%  Weight:    93.6 kg (206 lb 6.4 oz)  Height:        General exam: Appears calm and comfortable. Sitting up in chair. Respiratory system: Decreased breath sounds right base. Respiratory effort unlabored. Cardiovascular system: HSIR. No JVD,  rubs,  gallops or clicks. II/VI murmur. Gastrointestinal system: Abdomen is nondistended, soft and nontender. No organomegaly or masses felt. Normal bowel sounds heard. Central nervous system: Alert. No focal neurological deficits. Extremities: Unna boots bilateral lower extremities.. Skin: Venous stasis changes to the feet. Psychiatry: Judgement and insight appear normal. Mood & affect appropriate.  The results of significant diagnostics from this hospitalization (including imaging, microbiology, ancillary and laboratory) are listed below for reference.     Procedures and Diagnostic Studies:   Dg Chest 2 View  Result Date: 09/05/2016 CLINICAL DATA:  Wheezing shortness of breath and hypoxia EXAM: CHEST  2 VIEW COMPARISON:  08/30/2016 FINDINGS: Small bilateral pleural effusions, increased on the right side and probably layering. Moderate severe cardiomegaly with central vascular congestion. Atelectasis left lung base. Hazy opacity, new compared to prior, within the right lung base, may relate to combination of layering effusion, edema and or infiltrate. No pneumothorax. Sclerotic lesion in the proximal left humerus unchanged IMPRESSION: 1. Cardiomegaly. 2. Increased hazy opacity in the right lower lung, could relate to layering pleural effusion although difficult to exclude asymmetric edema or infiltrates 3. Small left pleural effusion. Electronically Signed   By: Donavan Foil M.D.   On: 09/05/2016 00:14     Labs:   Basic Metabolic Panel:  Recent Labs Lab 09/05/16 0019 09/05/16 0344 09/06/16 0300  NA 144  --  143  K 3.6  --  3.6  CL 102  --  99*  CO2 33*  --  36*  GLUCOSE 100*  --  99  BUN 23*  --  18  CREATININE 0.92  --  0.75  CALCIUM 9.1  --  8.6*  MG  --  2.1  --    GFR Estimated Creatinine Clearance: 44.9 mL/min (by C-G formula based on SCr of 0.75 mg/dL). Liver Function Tests:  Recent Labs Lab 09/05/16 0019  AST 16  ALT 8*  ALKPHOS 47  BILITOT 1.0  PROT 6.1*  ALBUMIN  3.4*   CBC:  Recent Labs Lab 09/05/16 0019 09/05/16 0344  WBC 5.0 4.9  NEUTROABS 3.2  --   HGB 12.3 11.9*  HCT 39.0 38.2  MCV 95.6 95.3  PLT 135* 139*   Cardiac Enzymes: No results for input(s): CKTOTAL, CKMB, CKMBINDEX, TROPONINI in the last 168 hours. BNP: Invalid input(s): POCBNP CBG:  Recent Labs Lab 09/05/16 0558 09/05/16 1132 09/05/16 1619 09/05/16 2119 09/06/16 0627  GLUCAP 94 90 91 84 91   D-Dimer No results for input(s): DDIMER in the last 72 hours. Hgb A1c No results for input(s): HGBA1C in the last 72 hours. Lipid Profile No results for input(s): CHOL, HDL, LDLCALC, TRIG, CHOLHDL, LDLDIRECT in the last 72 hours. Thyroid function studies No results for input(s): TSH, T4TOTAL, T3FREE, THYROIDAB in the last 72 hours.  Invalid input(s): FREET3 Anemia work up No results for input(s): VITAMINB12, FOLATE, FERRITIN, TIBC, IRON, RETICCTPCT in the last 72 hours. Microbiology Recent Results (from the past 240 hour(s))  MRSA PCR Screening     Status: Abnormal   Collection Time: 09/05/16  6:01 AM  Result Value Ref Range Status   MRSA by PCR POSITIVE (A) NEGATIVE  Final    Comment:        The GeneXpert MRSA Assay (FDA approved for NASAL specimens only), is one component of a comprehensive MRSA colonization surveillance program. It is not intended to diagnose MRSA infection nor to guide or monitor treatment for MRSA infections. RESULT CALLED TO, READ BACK BY AND VERIFIED WITH: Gertie Exon 0811 03.07.2018 N. MORRIS      Discharge Instructions:   Discharge Instructions    (HEART FAILURE PATIENTS) Call MD:  Anytime you have any of the following symptoms: 1) 3 pound weight gain in 24 hours or 5 pounds in 1 week 2) shortness of breath, with or without a dry hacking cough 3) swelling in the hands, feet or stomach 4) if you have to sleep on extra pillows at night in order to breathe.    Complete by:  As directed    Diet - low sodium heart healthy    Complete by:   As directed    Diet Carb Modified    Complete by:  As directed    Increase activity slowly    Complete by:  As directed      Allergies as of 09/06/2016      Reactions   Morphine And Related Other (See Comments)   sick   Statins Other (See Comments)   sick   Zetia [ezetimibe] Other (See Comments)   Side effect too strong    Atorvastatin    Other reaction(s): Other (See Comments) Leg swelling   Morphine    Other reaction(s): Vomiting (intolerance)   Codeine Other (See Comments)   unknown      Medication List    STOP taking these medications   albuterol (2.5 MG/3ML) 0.083% nebulizer solution Commonly known as:  PROVENTIL     TAKE these medications   apixaban 5 MG Tabs tablet Commonly known as:  ELIQUIS Take 1 tablet (5 mg total) by mouth 2 (two) times daily.   bisacodyl 10 MG suppository Commonly known as:  DULCOLAX Place 10 mg rectally as needed for moderate constipation.   bisacodyl 5 MG EC tablet Commonly known as:  DULCOLAX Take 5 mg by mouth daily.   clobetasol cream 0.05 % Commonly known as:  TEMOVATE Apply 1 application topically daily as needed (rash).   diltiazem 120 MG 24 hr capsule Commonly known as:  CARDIZEM CD Take 3 capsules (360 mg total) by mouth daily.   ferrous sulfate 325 (65 FE) MG tablet Take 325 mg by mouth daily with breakfast.   furosemide 40 MG tablet Commonly known as:  LASIX Take 1.5 tablets (60 mg total) by mouth 2 (two) times daily. Take 1.5 tablets(60mg ) daily starting 08/17/2015. What changed:  when to take this  Another medication with the same name was removed. Continue taking this medication, and follow the directions you see here.   insulin aspart 100 UNIT/ML injection Commonly known as:  novoLOG Inject 0-15 Units into the skin 3 (three) times daily with meals.   levothyroxine 125 MCG tablet Commonly known as:  SYNTHROID, LEVOTHROID Take 125 mcg by mouth daily before breakfast.   lisinopril 5 MG tablet Commonly  known as:  PRINIVIL,ZESTRIL Take 5 mg by mouth daily.   potassium chloride SA 20 MEQ tablet Commonly known as:  K-DUR,KLOR-CON Take 1 tablet (20 mEq total) by mouth 2 (two) times daily. What changed:  when to take this   senna-docusate 8.6-50 MG tablet Commonly known as:  Senokot-S Take 1 tablet by mouth 2 (two) times daily.  sodium phosphate 7-19 GM/118ML Enem Place 133 mLs (1 enema total) rectally daily as needed for severe constipation.   VITAMIN B-12 IJ Inject 1,000 Units as directed every 30 (thirty) days.            Durable Medical Equipment        Start     Ordered   09/06/16 0919  DME Oxygen  Once    Question Answer Comment  Mode or (Route) Nasal cannula   Frequency Continuous (stationary and portable oxygen unit needed)   Oxygen conserving device Yes   Oxygen delivery system Gas      09/06/16 0918   09/05/16 1127  For home use only DME oxygen  Once    Question Answer Comment  Mode or (Route) Nasal cannula   Frequency Continuous (stationary and portable oxygen unit needed)   Oxygen conserving device Yes   Oxygen delivery system Gas      09/05/16 1126     Follow-up Information    [name], MD. Schedule an appointment as soon as possible for a visit in 1 week(s).   Specialty:  Family Medicine Why:  Hospital follow up. Contact information: [place] [address] [phone]            Time coordinating discharge: 35 minutes.  Signed:  RAMA,CHRISTINA  Pager [phone] [facility] 09/06/2016, 9:19 AM

## 2016-09-06 NOTE — Evaluation (Signed)
Physical Therapy Evaluation Patient Details Name: [name] MRN: [id number] DOB: 01/13/1920 Today's Date: 09/06/2016   History of Present Illness  83 yo admitted with CHF who has been at [facility] since last admission. PMHx: ovarian CA, Bladder CA, Breast CA, Afib, HF, HCAP  Clinical Impression  Pt very pleasant and eager to return to ALF. Pt with assist needed to stand from low chair and cues for gait but overall doing well with ambulation and activity. Pt with decreased balance, activity tolerance and gait who will benefit from acute therapy to maximize mobility and independence as well as HHPT at ALF. Encouraged daily ambulation with nursing acutely.  HR 93, on 2L with inability to gain pulse ox reading throughout session     Follow Up Recommendations Home health PT    Equipment Recommendations  None recommended by PT    Recommendations for Other Services       Precautions / Restrictions Precautions Precautions: Fall Restrictions Weight Bearing Restrictions: No      Mobility  Bed Mobility               General bed mobility comments: in chair on arrival  Transfers Overall transfer level: Needs assistance   Transfers: Sit to/from Stand Sit to Stand: Min assist         General transfer comment: assist to rise from low recliner  Ambulation/Gait Ambulation/Gait assistance: Min guard Ambulation Distance (Feet): 120 Feet Assistive device: Rolling walker (2 wheeled) Gait Pattern/deviations: Step-through pattern;Trunk flexed   Gait velocity interpretation: Below normal speed for age/gender General Gait Details: pt maintains self posterior to walker despite mod cues to step into RW and extend trunk, pt without LOB and flexed trunk  Stairs            Wheelchair Mobility    Modified Rankin (Stroke Patients Only)       Balance Overall balance assessment: Needs assistance   Sitting balance-Leahy Scale: Good       Standing balance-Leahy Scale:  Poor                               Pertinent Vitals/Pain Pain Assessment: No/denies pain    Home Living Family/patient expects to be discharged to:: Assisted living               Home Equipment: Walker - 2 wheels;Shower seat      Prior Function Level of Independence: Needs assistance   Gait / Transfers Assistance Needed: Ambulates with RW; walks around loop daily.  ADL's / Homemaking Assistance Needed: Assist with showers, able to dress self, meals provided by facility        Hand Dominance        Extremity/Trunk Assessment   Upper Extremity Assessment Upper Extremity Assessment: Generalized weakness    Lower Extremity Assessment Lower Extremity Assessment: Generalized weakness    Cervical / Trunk Assessment Cervical / Trunk Assessment: Kyphotic  Communication   Communication: No difficulties  Cognition Arousal/Alertness: Awake/alert Behavior During Therapy: WFL for tasks assessed/performed Overall Cognitive Status: Within Functional Limits for tasks assessed                      General Comments      Exercises     Assessment/Plan    PT Assessment Patient needs continued PT services  PT Problem List Decreased mobility;Decreased strength;Decreased activity tolerance;Decreased knowledge of use of DME;Decreased balance  PT Treatment Interventions Gait training;Therapeutic exercise;Patient/family education;Functional mobility training;DME instruction;Therapeutic activities    PT Goals (Current goals can be found in the Care Plan section)  Acute Rehab PT Goals Patient Stated Goal: return to [facility] PT Goal Formulation: With patient Time For Goal Achievement: 09/20/16 Potential to Achieve Goals: Good    Frequency Min 3X/week   Barriers to discharge        Co-evaluation               End of Session Equipment Utilized During Treatment: Gait belt;Oxygen Activity Tolerance: Patient tolerated treatment  well Patient left: in chair;with call bell/phone within reach;with chair alarm set Nurse Communication: Mobility status PT Visit Diagnosis: Other abnormalities of gait and mobility (R26.89);Muscle weakness (generalized) (M62.81)         Time: 2336-1224 PT Time Calculation (min) (ACUTE ONLY): 22 min   Charges:   PT Evaluation $PT Eval Moderate Complexity: 1 Procedure     PT G Codes:         Catarina Huntley B Vadie Principato Sep 10, 2016, 1:00 PM  Elwyn Reach, [place]

## 2016-09-06 NOTE — [place] FL2 (Signed)
[place] MEDICAID FL2 LEVEL OF CARE SCREENING TOOL     IDENTIFICATION  Patient Name: [name] Birthdate: 1920/06/21 Sex: female Admission Date (Current Location): 09/04/2016  [facility] and [place] Number:  Herbalist and Address:  [facility] [place], [place] [address], [place], [place] [address]      Provider Number: [id number]  Attending Physician Name and Address:  Venetia Maxon Rama, MD  Relative Name and Phone Number:  Audria Nine, [phone]    Current Level of Care: Hospital Recommended Level of Care: [place] Prior Approval Number:    Date Approved/Denied:   PASRR Number:    Discharge Plan: Home (ALF)    Current Diagnoses: Patient Active Problem List   Diagnosis Date Noted  . Severe obesity (BMI >= 40) ([place]) 09/05/2016  . Chronic venous stasis dermatitis of both lower extremities 09/05/2016  . MRSA carrier 09/05/2016  . Chronic cutaneous venous stasis ulcer ([place]) 08/03/2016  . Goals of care, counseling/discussion   . Diabetes mellitus with complication ([place])   . Bilateral leg edema 07/30/2016  . Acute respiratory failure with hypoxia ([place]) 07/30/2016  . HCAP (healthcare-associated pneumonia) 07/19/2015  . Essential hypertension 03/23/2013  . Nausea & vomiting 11/11/2012  . Hypothyroidism 11/11/2012  . Hx SBO 11/11/2012  . Colon cancer ([place]) 11/11/2012  . Acute on chronic diastolic CHF (congestive heart failure) ([place]) 11/11/2012  . Venous insufficiency 11/11/2012  . Hyperlipidemia 11/11/2012  . OSA (obstructive sleep apnea) 11/11/2012  . HX: breast cancer 11/11/2012  . Hx of cervical cancer 11/11/2012  . H/O ovarian cancer 11/11/2012  . Hx of bladder cancer 11/11/2012  . S/P hysterectomy 11/11/2012  . SBO (small bowel obstruction) 12/06/2011  . Permanent atrial fibrillation ([place]) 02/17/2011    Orientation RESPIRATION BLADDER Height & Weight     Self, Time, Situation, Place  O2 Incontinent Weight: 206  lb 6.4 oz (93.6 kg) Height:  5\' 1"  (154.9 cm)  BEHAVIORAL SYMPTOMS/MOOD NEUROLOGICAL BOWEL NUTRITION STATUS      Continent Diet (Heart Healthy )  AMBULATORY STATUS COMMUNICATION OF NEEDS Skin   Limited Assist Verbally Normal                       Personal Care Assistance Level of Assistance  Bathing, Dressing, Feeding Bathing Assistance: Limited assistance Feeding assistance: Independent Dressing Assistance: Limited assistance     Functional Limitations Info  Sight, Hearing, Speech Sight Info: Adequate Hearing Info: Adequate Speech Info: Adequate    SPECIAL CARE FACTORS FREQUENCY  PT (By licensed PT), OT (By licensed OT)     PT Frequency: 3x week OT Frequency: 3x week            Contractures Contractures Info: Not present    Additional Factors Info  Code Status Code Status Info: DNR             Current Medications (09/06/2016):  This is the current hospital active medication list Current Facility-Administered Medications  Medication Dose Route Frequency Provider Last Rate Last Dose  . acetaminophen (TYLENOL) tablet 650 mg  650 mg Oral Q6H PRN Rise Patience, MD       Or  . acetaminophen (TYLENOL) suppository 650 mg  650 mg Rectal Q6H PRN Rise Patience, MD      . apixaban (ELIQUIS) tablet 5 mg  5 mg Oral BID Rise Patience, MD   5 mg at 09/06/16 1042  . bisacodyl (DULCOLAX) EC tablet 5 mg  5  mg Oral Daily Rise Patience, MD   5 mg at 09/06/16 1000  . bisacodyl (DULCOLAX) suppository 10 mg  10 mg Rectal PRN Rise Patience, MD      . Chlorhexidine Gluconate Cloth 2 % PADS 6 each  6 each Topical Q0600 Venetia Maxon Rama, MD   6 each at 09/06/16 0510  . diltiazem (CARDIZEM CD) 24 hr capsule 360 mg  360 mg Oral Daily Rise Patience, MD   360 mg at 09/06/16 1042  . ferrous sulfate tablet 325 mg  325 mg Oral Q breakfast Rise Patience, MD   325 mg at 09/06/16 1042  . furosemide (LASIX) injection 40 mg  40 mg Intravenous Q12H Rise Patience, MD   40 mg at 09/06/16 1042  . insulin aspart (novoLOG) injection 0-9 Units  0-9 Units Subcutaneous TID WC Rise Patience, MD      . levothyroxine (SYNTHROID, LEVOTHROID) tablet 125 mcg  125 mcg Oral QAC breakfast Rise Patience, MD   125 mcg at 09/06/16 1042  . lisinopril (PRINIVIL,ZESTRIL) tablet 5 mg  5 mg Oral Daily Rise Patience, MD   5 mg at 09/06/16 1043  . mupirocin ointment (BACTROBAN) 2 % 1 application  1 application Nasal BID Venetia Maxon Rama, MD   1 application at 06/00/45 1044  . ondansetron (ZOFRAN) tablet 4 mg  4 mg Oral Q6H PRN Rise Patience, MD       Or  . ondansetron [facility]) injection 4 mg  4 mg Intravenous Q6H PRN Rise Patience, MD      . potassium chloride SA (K-DUR,KLOR-CON) CR tablet 20 mEq  20 mEq Oral Daily Rise Patience, MD   20 mEq at 09/06/16 1043  . senna-docusate (Senokot-S) tablet 1 tablet  1 tablet Oral BID Rise Patience, MD   1 tablet at 09/06/16 1042     Discharge Medications: Please see discharge summary for a list of discharge medications.  Relevant Imaging Results:  Relevant Lab Results:   Additional Information SSN:[id number]  Wende Neighbors, LCSW

## 2016-09-06 NOTE — Progress Notes (Signed)
Progress Note    [name]  KYH:[id number] DOB: March 19, 1920  DOA: 09/04/2016 PCP: [name], MD    Brief Narrative:   Chief complaint: Follow-up CHF exacerbation.  [name] is an 83 y.o. female with a PMH of chronic atrial fibrillation on apixiban, history of breast cancer and colon cancer, type 2 diabetes, OSA, hypothyroidism, and recent admission to the hospital 07/30/16-08/08/16 where she was treated for acute respiratory failure with hypoxia secondary to pneumonia, discharged to a SNF on oxygen who was readmitted 09/04/16 with worsening oxygen saturation accompanied by fatigue and shortness of breath. On admission, BNP was 305.5, and chest x-ray showed a small left pleural effusion, cardiomegaly, and increased hazy opacity in the right lower lung. Last 2-D echo done 07/20/15 showed an EF of 55-60 percent with moderate left ear, moderate MR, and mild TR with moderately elevated pulmonary pressure.  Assessment/Plan:   Principal Problem:   Acute respiratory failure with hypoxia/Acute on chronic diastolic CHF (congestive heart failure) (HCC) Lasix 40 mg IV every 12 hours ordered on admission, appears to be breathing better. Continue supplemental oxygen. Screening troponin negative. Continue supplemental oxygen.Chest x-ray personally reviewed. Clearly shows right sided changes consistent with asymmetric pulmonary edema versus infiltrate when compared to films done 08/30/16. Suspect this is edema rather than infection. I/O balance -900 mL. Weight down 11 pounds. Continue Lasix. Active Problems:   Hypothyroidism Continue Synthroid.    OSA (obstructive sleep apnea) Refuses CPAP.    Essential hypertension Continue Cardizem and Lasix.    Diabetes mellitus with complication (HCC) Currently being managed with insulin sensitive SSI. CBGs well-controlled at 84-94. No need to continue SSI.    Chronic atrial fibrillation (HCC) Anticoagulated and on Cardizem for rate control.  Heart rate reasonable.    Severe obesity (BMI >= 40) (HCC) Body mass index is 40.86 kg/m.    Chronic venous stasis/cutaneous venous stasis ulcer Wound care nurse to inspect lower extremities and reapply Unna boots.    MRSA carrier Contact precautions. Decontamination therapy ordered.  Family Communication/Anticipated D/C date and plan/Code Status   DVT prophylaxis: On Apixiban. Code Status: Full Code.  Family Communication: Daughter updated at the bedside. Disposition Plan: Anticipate discharge to [facility] 09[facility]06/16.   Medical Consultants:    None.   Procedures:    None  Anti-Infectives:    None  Subjective:   Last BM was last night.  Feels much better. Sitting up in chair.  Objective:    Vitals:   09/05/16 1006 09/05/16 1405 09/05/16 2100 09/06/16 0514  BP: 126/78 (!) 120/56 (!) 142/78 124/62  Pulse:  84 84 83  Resp:  19 18 18   Temp:   97.3 F (36.3 C) 97.4 F (36.3 C)  TempSrc:   Oral Oral  SpO2:  93% 100% 100%  Weight:    93.6 kg (206 lb 6.4 oz)  Height:        Intake/Output Summary (Last 24 hours) at 09/06/16 1137 Last data filed at 09/06/16 0835  Gross per 24 hour  Intake                0 ml  Output              800 ml  Net             -800 ml   Filed Weights   09/04/16 2229 09/05/16 0547 09/06/16 0514  Weight: 98.4 kg (217 lb) 98.1 kg (216 lb 4.3 oz) 93.6 kg (206 lb 6.4  oz)    Exam: General exam: Appears calm and comfortable. Sitting up in chair. Respiratory system: Decreased breath sounds right base with a few crackles. Respiratory effort mildly increased. Cardiovascular system: HSIR. No JVD,  rubs, gallops or clicks. II/VI murmur. Gastrointestinal system: Abdomen is nondistended, soft and nontender. No organomegaly or masses felt. Normal bowel sounds heard. Central nervous system: Alert. No focal neurological deficits. Extremities: Unna boots bilateral lower extremities.. Skin: Venous stasis changes to the feet. Psychiatry: Judgement  and insight appear normal. Mood & affect appropriate.   Data Reviewed:   I have personally reviewed following labs and imaging studies:  Labs: Basic Metabolic Panel:  Recent Labs Lab 09/05/16 0019 09/05/16 0344 09/06/16 0300  NA 144  --  143  K 3.6  --  3.6  CL 102  --  99*  CO2 33*  --  36*  GLUCOSE 100*  --  99  BUN 23*  --  18  CREATININE 0.92  --  0.75  CALCIUM 9.1  --  8.6*  MG  --  2.1  --    GFR Estimated Creatinine Clearance: 44.9 mL/min (by C-G formula based on SCr of 0.75 mg/dL). Liver Function Tests:  Recent Labs Lab 09/05/16 0019  AST 16  ALT 8*  ALKPHOS 47  BILITOT 1.0  PROT 6.1*  ALBUMIN 3.4*   CBC:  Recent Labs Lab 09/05/16 0019 09/05/16 0344  WBC 5.0 4.9  NEUTROABS 3.2  --   HGB 12.3 11.9*  HCT 39.0 38.2  MCV 95.6 95.3  PLT 135* 139*   CBG:  Recent Labs Lab 09/05/16 1132 09/05/16 1619 09/05/16 2119 09/06/16 0627 09/06/16 1130  GLUCAP 90 91 84 91 93   Sepsis Labs:  Recent Labs Lab 09/05/16 0019 09/05/16 0344 09/05/16 0745  PROCALCITON  --   --  <0.10  WBC 5.0 4.9  --     Microbiology Recent Results (from the past 240 hour(s))  MRSA PCR Screening     Status: Abnormal   Collection Time: 09/05/16  6:01 AM  Result Value Ref Range Status   MRSA by PCR POSITIVE (A) NEGATIVE Final    Comment:        The GeneXpert MRSA Assay (FDA approved for NASAL specimens only), is one component of a comprehensive MRSA colonization surveillance program. It is not intended to diagnose MRSA infection nor to guide or monitor treatment for MRSA infections. RESULT CALLED TO, READ BACK BY AND VERIFIED WITH: Gertie Exon [id number] 03.07.2018 N. MORRIS     Radiology: Dg Chest 2 View  Result Date: 09/05/2016 CLINICAL DATA:  Wheezing shortness of breath and hypoxia EXAM: CHEST  2 VIEW COMPARISON:  08/30/2016 FINDINGS: Small bilateral pleural effusions, increased on the right side and probably layering. Moderate severe cardiomegaly with central  vascular congestion. Atelectasis left lung base. Hazy opacity, new compared to prior, within the right lung base, may relate to combination of layering effusion, edema and or infiltrate. No pneumothorax. Sclerotic lesion in the proximal left humerus unchanged IMPRESSION: 1. Cardiomegaly. 2. Increased hazy opacity in the right lower lung, could relate to layering pleural effusion although difficult to exclude asymmetric edema or infiltrates 3. Small left pleural effusion. Electronically Signed   By: Donavan Foil M.D.   On: 09/05/2016 00:14    Medications:   . apixaban  5 mg Oral BID  . bisacodyl  5 mg Oral Daily  . Chlorhexidine Gluconate Cloth  6 each Topical Q0600  . diltiazem  360 mg Oral Daily  .  ferrous sulfate  325 mg Oral Q breakfast  . furosemide  40 mg Intravenous Q12H  . insulin aspart  0-9 Units Subcutaneous TID WC  . levothyroxine  125 mcg Oral QAC breakfast  . lisinopril  5 mg Oral Daily  . mupirocin ointment  1 application Nasal BID  . potassium chloride SA  20 mEq Oral Daily  . senna-docusate  1 tablet Oral BID   Continuous Infusions:  No charge, patient admitted after midnight.  LOS: 1 day   Kacy Hegna  [facility] Pager [phone]. If unable to reach me by pager, please call my cell phone at [phone].  *Please refer to [email or web address], password [email or web address] to get updated schedule on who will round on this patient, as hospitalists switch teams weekly. If 7PM-7AM, please contact night-coverage at [email or web address], password [email or web address] for any overnight needs.  09/06/2016, 11:37 AM

## 2016-09-06 NOTE — Progress Notes (Signed)
Clinical Social Worker facilitated patient discharge including contacting patient family and facility to confirm patient discharge plans.  Clinical information faxed to facility and family agreeable with plan.  Patients daughter will pick patient up from  and transport her back to facility .  RN Katrina to call [phone] report prior to discharge.  Clinical Social Worker will sign off for now as social work intervention is no longer needed. Please consult [place] again if new need arises.  Rhea Pink, MSW, [place]

## 2016-09-06 NOTE — Clinical Social Work Placement (Signed)
   CLINICAL SOCIAL WORK PLACEMENT  NOTE  Date:  09/06/2016  Patient Details  Name: [name] MRN: [id number] Date of Birth: 1920/02/23  Clinical Social Work is seeking post-discharge placement for this patient at the [place] level of care (*CSW will initial, date and re-position this form in  chart as items are completed):  No   Patient/family provided with [place] Work Department's list of facilities offering this level of care within the geographic area requested by the patient (or if unable, by the patient's family).  Yes   Patient/family informed of their freedom to choose among providers that offer the needed level of care, that participate in Medicare, Medicaid or managed care program needed by the patient, have an available bed and are willing to accept the patient.  Yes   Patient/family informed of [facility]'s ownership interest in [facility] and [facility], as well as of the fact that they are under no obligation to receive care at these facilities.  PASRR submitted to EDS on       PASRR number received on       Existing PASRR number confirmed on       FL2 transmitted to all facilities in geographic area requested by pt/family on       FL2 transmitted to all facilities within larger geographic area on       Patient informed that his/her managed care company has contracts with or will negotiate with certain facilities, including the following:            Patient/family informed of bed offers received.  Patient chooses bed at  (pt going back to alf)     Physician recommends and patient chooses bed at      Patient to be transferred to   on 09/06/16.  Patient to be transferred to facility by daughter     Patient family notified on 09/06/16 of transfer.  Name of family member notified:        PHYSICIAN Please sign FL2     Additional Comment:    _______________________________________________ Wende Neighbors,  LCSW 09/06/2016, 11:04 AM

## 2016-09-06 NOTE — Progress Notes (Signed)
Caryl Pina SW stated that her daughter will provide transportation to the ALF; Brad with [facility] [employer] called to deliver a portable oxygen tank to the room; patient is not to be discharged prior to receiving oxygen tank; B Pennie Rushing [phone]

## 2016-09-06 NOTE — Discharge Instructions (Signed)
Heart Failure °Heart failure is a condition in which the heart has trouble pumping blood because it has become weak or stiff. This means that the heart does not pump blood efficiently for the body to work well. For some people with heart failure, fluid may back up into the lungs and there may be swelling (edema) in the lower legs. Heart failure is usually a long-term (chronic) condition. It is important for you to take good care of yourself and follow the treatment plan from your health care provider. °What are the causes? °This condition is caused by some health problems, including: °· High blood pressure (hypertension). Hypertension causes the heart muscle to work harder than normal. High blood pressure eventually causes the heart to become stiff and weak. °· Coronary artery disease (CAD). CAD is the buildup of cholesterol and fat (plaques) in the arteries of the heart. °· Heart attack (myocardial infarction). Injured tissue, which is caused by the heart attack, does not contract as well and the heart's ability to pump blood is weakened. °· Abnormal heart valves. When the heart valves do not open and close properly, the heart muscle must pump harder to keep the blood flowing. °· Heart muscle disease (cardiomyopathy or myocarditis). Heart muscle disease is damage to the heart muscle from a variety of causes, such as drug or alcohol abuse, infections, or unknown causes. These can increase the risk of heart failure. °· Lung disease. When the lungs do not work properly, the heart must work harder. ° °What increases the risk? °Risk of heart failure increases as a person ages. This condition is also more likely to develop in people who: °· Are overweight. °· Are female. °· Smoke or chew tobacco. °· Abuse alcohol or illegal drugs. °· Have taken medicines that can damage the heart, such as chemotherapy drugs. °· Have diabetes. °? High blood sugar (glucose) is associated with high fat (lipid) levels in the blood. °? Diabetes  can also damage tiny blood vessels that carry nutrients to the heart muscle. °· Have abnormal heart rhythms. °· Have thyroid problems. °· Have low blood counts (anemia). ° °What are the signs or symptoms? °Symptoms of this condition include: °· Shortness of breath with activity, such as when climbing stairs. °· Persistent cough. °· Swelling of the feet, ankles, legs, or abdomen. °· Unexplained weight gain. °· Difficulty breathing when lying flat (orthopnea). °· Waking from sleep because of the need to sit up and get more air. °· Rapid heartbeat. °· Fatigue and loss of energy. °· Feeling light-headed, dizzy, or close to fainting. °· Loss of appetite. °· Nausea. °· Increased urination during the night (nocturia). °· Confusion. ° °How is this diagnosed? °This condition is diagnosed based on: °· Medical history, symptoms, and a physical exam. °· Diagnostic tests, which may include: °? Echocardiogram. °? Electrocardiogram (ECG). °? Chest X-ray. °? Blood tests. °? Exercise stress test. °? Radionuclide scans. °? Cardiac catheterization and angiogram. ° °How is this treated? °Treatment for this condition is aimed at managing the symptoms of heart failure. Medicines, behavioral changes, or other treatments may be necessary to treat heart failure. °Medicines °These may include: °· Angiotensin-converting enzyme (ACE) inhibitors. This type of medicine blocks the effects of a blood protein called angiotensin-converting enzyme. ACE inhibitors relax (dilate) the blood vessels and help to lower blood pressure. °· Angiotensin receptor blockers (ARBs). This type of medicine blocks the actions of a blood protein called angiotensin. ARBs dilate the blood vessels and help to lower blood pressure. °· Water   pills (diuretics). Diuretics cause the kidneys to remove salt and water from the blood. The extra fluid is removed through urination, leaving a lower volume of blood that the heart has to pump. °· Beta blockers. These improve heart  muscle strength and they prevent the heart from beating too quickly. °· Digoxin. This increases the force of the heartbeat. ° °Healthy behavior changes °These may include: °· Reaching and maintaining a healthy weight. °· Stopping smoking or chewing tobacco. °· Eating heart-healthy foods. °· Limiting or avoiding alcohol. °· Stopping use of street drugs (illegal drugs). °· Physical activity. ° °Other treatments °These may include: °· Surgery to open blocked coronary arteries or repair damaged heart valves. °· Placement of a biventricular pacemaker to improve heart muscle function (cardiac resynchronization therapy). This device paces both the right ventricle and left ventricle. °· Placement of a device to treat serious abnormal heart rhythms (implantable cardioverter defibrillator, or ICD). °· Placement of a device to improve the pumping ability of the heart (left ventricular assist device, or LVAD). °· Heart transplant. This can cure heart failure, and it is considered for certain patients who do not improve with other therapies. ° °Follow these instructions at home: °Medicines °· Take over-the-counter and prescription medicines only as told by your health care provider. Medicines are important in reducing the workload of your heart, slowing the progression of heart failure, and improving your symptoms. °? Do not stop taking your medicine unless your health care provider told you to do that. °? Do not skip any dose of medicine. °? Refill your prescriptions before you run out of medicine. You need your medicines every day. °Eating and drinking ° °· Eat heart-healthy foods. Talk with a dietitian to make an eating plan that is right for you. °? Choose foods that contain no trans fat and are low in saturated fat and cholesterol. Healthy choices include fresh or frozen fruits and vegetables, fish, lean meats, legumes, fat-free or low-fat dairy products, and whole-grain or high-fiber foods. °? Limit salt (sodium) if  directed by your health care provider. Sodium restriction may reduce symptoms of heart failure. Ask a dietitian to recommend heart-healthy seasonings. °? Use healthy cooking methods instead of frying. Healthy methods include roasting, grilling, broiling, baking, poaching, steaming, and stir-frying. °· Limit your fluid intake if directed by your health care provider. Fluid restriction may reduce symptoms of heart failure. °Lifestyle °· Stop smoking or using chewing tobacco. Nicotine and tobacco can damage your heart and your blood vessels. Do not use nicotine gum or patches before talking to your health care provider. °· Limit alcohol intake to no more than 1 drink per day for non-pregnant women and 2 drinks per day for men. One drink equals 12 oz of beer, 5 oz of wine, or 1½ oz of hard liquor. °? Drinking more than that is harmful to your heart. Tell your health care provider if you drink alcohol several times a week. °? Talk with your health care provider about whether any level of alcohol use is safe for you. °? If your heart has already been damaged by alcohol or you have severe heart failure, drinking alcohol should be stopped completely. °· Stop use of illegal drugs. °· Lose weight if directed by your health care provider. Weight loss may reduce symptoms of heart failure. °· Do moderate physical activity if directed by your health care provider. People who are elderly and people with severe heart failure should consult with a health care provider for physical activity recommendations. °  Monitor important information °· Weigh yourself every day. Keeping track of your weight daily helps you to notice excess fluid sooner. °? Weigh yourself every morning after you urinate and before you eat breakfast. °? Wear the same amount of clothing each time you weigh yourself. °? Record your daily weight. Provide your health care provider with your weight record. °· Monitor and record your blood pressure as told by your health  care provider. °· Check your pulse as told by your health care provider. °Dealing with extreme temperatures °· If the weather is extremely hot: °? Avoid vigorous physical activity. °? Use air conditioning or fans or seek a cooler location. °? Avoid caffeine and alcohol. °? Wear loose-fitting, lightweight, and light-colored clothing. °· If the weather is extremely cold: °? Avoid vigorous physical activity. °? Layer your clothes. °? Wear mittens or gloves, a hat, and a scarf when you go outside. °? Avoid alcohol. °General instructions °· Manage other health conditions such as hypertension, diabetes, thyroid disease, or abnormal heart rhythms as told by your health care provider. °· Learn to manage stress. If you need help to do this, ask your health care provider. °· Plan rest periods when fatigued. °· Get ongoing education and support as needed. °· Participate in or seek rehabilitation as needed to maintain or improve independence and quality of life. °· Stay up to date with immunizations. Keeping current on pneumococcal and influenza immunizations is especially important to prevent respiratory infections. °· Keep all follow-up visits as told by your health care provider. This is important. °Contact a health care provider if: °· You have a rapid weight gain. °· You have increasing shortness of breath that is unusual for you. °· You are unable to participate in your usual physical activities. °· You tire easily. °· You cough more than normal, especially with physical activity. °· You have any swelling or more swelling in areas such as your hands, feet, ankles, or abdomen. °· You are unable to sleep because it is hard to breathe. °· You feel like your heart is beating quickly (palpitations). °· You become dizzy or light-headed when you stand up. °Get help right away if: °· You have difficulty breathing. °· You notice or your family notices a change in your awareness, such as having trouble staying awake or having  difficulty with concentration. °· You have pain or discomfort in your chest. °· You have an episode of fainting (syncope). °This information is not intended to replace advice given to you by your health care provider. Make sure you discuss any questions you have with your health care provider. °Document Released: 06/18/2005 Document Revised: 02/21/2016 Document Reviewed: 01/11/2016 °Elsevier Interactive Patient Education © 2017 [employer] ° °

## 2016-09-06 NOTE — Progress Notes (Signed)
Patient is discharged back to [facility]F; CM talked to [facility] with [place], her oxygen was delivered to [facility] yesterday. If patient is transported back to the ALF via car she will need portable oxygen for transportation; if she goes via ambulance, the ambulance will provide oxygen. Caryl Pina SW made aware; Mindi Slicker [facility] [phone]

## 2016-09-06 NOTE — Clinical Social Work Note (Signed)
Clinical Social Work Assessment  Patient Details  Name: [name] MRN: [id number] Date of Birth: 1920-05-30  Date of referral:  09/06/16               Reason for consult:  Discharge Planning                Permission sought to share information with:  Family Supports Permission granted to share information::     Name::     Juanetta Gosling, [phone]  Agency::     Relationship::  daughter  Contact Information:  [phone]  Housing/Transportation Living arrangements for the past 2 months:  [place] of Information:  Adult Children Patient Interpreter Needed:  None Criminal Activity/Legal Involvement Pertinent to Current Situation/Hospitalization:  No - Comment as needed Significant Relationships:  Adult Children Lives with:  Other (Comment) Do you feel safe going back to the place where you live?  Yes Need for family participation in patient care:  Yes (Comment)  Care giving concerns:  patient has family support   Facilities manager / plan:  CSW met patient at bedside but was unable to do assessment with patient. CSW contacted daughter and daughter stated that patient lives in an ALF from [place] in [place]. CSW contacted facility and facility stated they have patients O2 tank at facility. CSW contacted NCM and stated that daughter would like to transport patient to facility and NCM will order a portable O2 tank to go with patient.   Employment status:  Retired Forensic scientist:  Medicare PT Recommendations:  Not assessed at this time [place] / Referral to community resources:  Other (Comment Required) (ALF)  Patient/Family's Response to care: Patient verbalized appreciation and understanding for CSW role and involvement in care.  Patient/Family's Understanding of and Emotional Response to Diagnosis, Current Treatment, and Prognosis:  **Patient with good understanding of current medical state and limitations around most  recent hospitalization. Patient is agreeable with ALF placement so she can be home.  Emotional Assessment Appearance:  Appears stated age Attitude/Demeanor/Rapport:  Unable to Assess Affect (typically observed):  Unable to Assess Orientation:  Oriented to Self, Oriented to Place, Oriented to Situation, Oriented to  Time Alcohol / Substance use:  Not Applicable Psych involvement (Current and /or in the community):  No (Comment)  Discharge Needs  Concerns to be addressed:  No discharge needs identified Readmission within the last 30 days:  No Current discharge risk:  None Barriers to Discharge:  No Barriers Identified   Wende Neighbors, LCSW 09/06/2016, 10:56 AM

## 2016-09-08 DIAGNOSIS — L03115 Cellulitis of right lower limb: Secondary | ICD-10-CM | POA: Diagnosis not present

## 2016-09-08 DIAGNOSIS — L03116 Cellulitis of left lower limb: Secondary | ICD-10-CM | POA: Diagnosis not present

## 2016-09-08 DIAGNOSIS — I4891 Unspecified atrial fibrillation: Secondary | ICD-10-CM | POA: Diagnosis not present

## 2016-09-08 DIAGNOSIS — I110 Hypertensive heart disease with heart failure: Secondary | ICD-10-CM | POA: Diagnosis not present

## 2016-09-08 DIAGNOSIS — I5033 Acute on chronic diastolic (congestive) heart failure: Secondary | ICD-10-CM | POA: Diagnosis not present

## 2016-09-08 DIAGNOSIS — R238 Other skin changes: Secondary | ICD-10-CM | POA: Diagnosis not present

## 2016-09-11 DIAGNOSIS — R238 Other skin changes: Secondary | ICD-10-CM | POA: Diagnosis not present

## 2016-09-11 DIAGNOSIS — L03115 Cellulitis of right lower limb: Secondary | ICD-10-CM | POA: Diagnosis not present

## 2016-09-11 DIAGNOSIS — I4891 Unspecified atrial fibrillation: Secondary | ICD-10-CM | POA: Diagnosis not present

## 2016-09-11 DIAGNOSIS — L03116 Cellulitis of left lower limb: Secondary | ICD-10-CM | POA: Diagnosis not present

## 2016-09-11 DIAGNOSIS — I110 Hypertensive heart disease with heart failure: Secondary | ICD-10-CM | POA: Diagnosis not present

## 2016-09-11 DIAGNOSIS — I5033 Acute on chronic diastolic (congestive) heart failure: Secondary | ICD-10-CM | POA: Diagnosis not present

## 2016-09-12 DIAGNOSIS — I5033 Acute on chronic diastolic (congestive) heart failure: Secondary | ICD-10-CM | POA: Diagnosis not present

## 2016-09-12 DIAGNOSIS — R238 Other skin changes: Secondary | ICD-10-CM | POA: Diagnosis not present

## 2016-09-12 DIAGNOSIS — L03115 Cellulitis of right lower limb: Secondary | ICD-10-CM | POA: Diagnosis not present

## 2016-09-12 DIAGNOSIS — I4891 Unspecified atrial fibrillation: Secondary | ICD-10-CM | POA: Diagnosis not present

## 2016-09-12 DIAGNOSIS — L03116 Cellulitis of left lower limb: Secondary | ICD-10-CM | POA: Diagnosis not present

## 2016-09-12 DIAGNOSIS — I110 Hypertensive heart disease with heart failure: Secondary | ICD-10-CM | POA: Diagnosis not present

## 2016-09-13 DIAGNOSIS — I509 Heart failure, unspecified: Secondary | ICD-10-CM | POA: Diagnosis not present

## 2016-09-13 DIAGNOSIS — R238 Other skin changes: Secondary | ICD-10-CM | POA: Diagnosis not present

## 2016-09-13 DIAGNOSIS — I5033 Acute on chronic diastolic (congestive) heart failure: Secondary | ICD-10-CM | POA: Diagnosis not present

## 2016-09-13 DIAGNOSIS — L03116 Cellulitis of left lower limb: Secondary | ICD-10-CM | POA: Diagnosis not present

## 2016-09-13 DIAGNOSIS — I10 Essential (primary) hypertension: Secondary | ICD-10-CM | POA: Diagnosis not present

## 2016-09-13 DIAGNOSIS — I110 Hypertensive heart disease with heart failure: Secondary | ICD-10-CM | POA: Diagnosis not present

## 2016-09-13 DIAGNOSIS — I4891 Unspecified atrial fibrillation: Secondary | ICD-10-CM | POA: Diagnosis not present

## 2016-09-13 DIAGNOSIS — L03115 Cellulitis of right lower limb: Secondary | ICD-10-CM | POA: Diagnosis not present

## 2016-09-14 DIAGNOSIS — R238 Other skin changes: Secondary | ICD-10-CM | POA: Diagnosis not present

## 2016-09-14 DIAGNOSIS — L03115 Cellulitis of right lower limb: Secondary | ICD-10-CM | POA: Diagnosis not present

## 2016-09-14 DIAGNOSIS — L03116 Cellulitis of left lower limb: Secondary | ICD-10-CM | POA: Diagnosis not present

## 2016-09-14 DIAGNOSIS — I4891 Unspecified atrial fibrillation: Secondary | ICD-10-CM | POA: Diagnosis not present

## 2016-09-14 DIAGNOSIS — I5033 Acute on chronic diastolic (congestive) heart failure: Secondary | ICD-10-CM | POA: Diagnosis not present

## 2016-09-14 DIAGNOSIS — I110 Hypertensive heart disease with heart failure: Secondary | ICD-10-CM | POA: Diagnosis not present

## 2016-09-15 DIAGNOSIS — L03115 Cellulitis of right lower limb: Secondary | ICD-10-CM | POA: Diagnosis not present

## 2016-09-15 DIAGNOSIS — R238 Other skin changes: Secondary | ICD-10-CM | POA: Diagnosis not present

## 2016-09-15 DIAGNOSIS — L03116 Cellulitis of left lower limb: Secondary | ICD-10-CM | POA: Diagnosis not present

## 2016-09-15 DIAGNOSIS — I4891 Unspecified atrial fibrillation: Secondary | ICD-10-CM | POA: Diagnosis not present

## 2016-09-15 DIAGNOSIS — I110 Hypertensive heart disease with heart failure: Secondary | ICD-10-CM | POA: Diagnosis not present

## 2016-09-15 DIAGNOSIS — I5033 Acute on chronic diastolic (congestive) heart failure: Secondary | ICD-10-CM | POA: Diagnosis not present

## 2016-09-17 DIAGNOSIS — I110 Hypertensive heart disease with heart failure: Secondary | ICD-10-CM | POA: Diagnosis not present

## 2016-09-17 DIAGNOSIS — R238 Other skin changes: Secondary | ICD-10-CM | POA: Diagnosis not present

## 2016-09-17 DIAGNOSIS — I4891 Unspecified atrial fibrillation: Secondary | ICD-10-CM | POA: Diagnosis not present

## 2016-09-17 DIAGNOSIS — L03115 Cellulitis of right lower limb: Secondary | ICD-10-CM | POA: Diagnosis not present

## 2016-09-17 DIAGNOSIS — I5033 Acute on chronic diastolic (congestive) heart failure: Secondary | ICD-10-CM | POA: Diagnosis not present

## 2016-09-17 DIAGNOSIS — L03116 Cellulitis of left lower limb: Secondary | ICD-10-CM | POA: Diagnosis not present

## 2016-09-18 DIAGNOSIS — I110 Hypertensive heart disease with heart failure: Secondary | ICD-10-CM | POA: Diagnosis not present

## 2016-09-18 DIAGNOSIS — I5033 Acute on chronic diastolic (congestive) heart failure: Secondary | ICD-10-CM | POA: Diagnosis not present

## 2016-09-18 DIAGNOSIS — L03115 Cellulitis of right lower limb: Secondary | ICD-10-CM | POA: Diagnosis not present

## 2016-09-18 DIAGNOSIS — L03116 Cellulitis of left lower limb: Secondary | ICD-10-CM | POA: Diagnosis not present

## 2016-09-18 DIAGNOSIS — I4891 Unspecified atrial fibrillation: Secondary | ICD-10-CM | POA: Diagnosis not present

## 2016-09-18 DIAGNOSIS — R238 Other skin changes: Secondary | ICD-10-CM | POA: Diagnosis not present

## 2016-09-19 DIAGNOSIS — I110 Hypertensive heart disease with heart failure: Secondary | ICD-10-CM | POA: Diagnosis not present

## 2016-09-19 DIAGNOSIS — L03116 Cellulitis of left lower limb: Secondary | ICD-10-CM | POA: Diagnosis not present

## 2016-09-19 DIAGNOSIS — R238 Other skin changes: Secondary | ICD-10-CM | POA: Diagnosis not present

## 2016-09-19 DIAGNOSIS — I4891 Unspecified atrial fibrillation: Secondary | ICD-10-CM | POA: Diagnosis not present

## 2016-09-19 DIAGNOSIS — L03115 Cellulitis of right lower limb: Secondary | ICD-10-CM | POA: Diagnosis not present

## 2016-09-19 DIAGNOSIS — I5033 Acute on chronic diastolic (congestive) heart failure: Secondary | ICD-10-CM | POA: Diagnosis not present

## 2016-09-20 DIAGNOSIS — I5033 Acute on chronic diastolic (congestive) heart failure: Secondary | ICD-10-CM | POA: Diagnosis not present

## 2016-09-20 DIAGNOSIS — L03115 Cellulitis of right lower limb: Secondary | ICD-10-CM | POA: Diagnosis not present

## 2016-09-20 DIAGNOSIS — I110 Hypertensive heart disease with heart failure: Secondary | ICD-10-CM | POA: Diagnosis not present

## 2016-09-20 DIAGNOSIS — L03116 Cellulitis of left lower limb: Secondary | ICD-10-CM | POA: Diagnosis not present

## 2016-09-20 DIAGNOSIS — I4891 Unspecified atrial fibrillation: Secondary | ICD-10-CM | POA: Diagnosis not present

## 2016-09-20 DIAGNOSIS — R238 Other skin changes: Secondary | ICD-10-CM | POA: Diagnosis not present

## 2016-09-21 DIAGNOSIS — L03116 Cellulitis of left lower limb: Secondary | ICD-10-CM | POA: Diagnosis not present

## 2016-09-21 DIAGNOSIS — I4891 Unspecified atrial fibrillation: Secondary | ICD-10-CM | POA: Diagnosis not present

## 2016-09-21 DIAGNOSIS — R238 Other skin changes: Secondary | ICD-10-CM | POA: Diagnosis not present

## 2016-09-21 DIAGNOSIS — L03115 Cellulitis of right lower limb: Secondary | ICD-10-CM | POA: Diagnosis not present

## 2016-09-21 DIAGNOSIS — I5033 Acute on chronic diastolic (congestive) heart failure: Secondary | ICD-10-CM | POA: Diagnosis not present

## 2016-09-21 DIAGNOSIS — I110 Hypertensive heart disease with heart failure: Secondary | ICD-10-CM | POA: Diagnosis not present

## 2016-09-24 DIAGNOSIS — I110 Hypertensive heart disease with heart failure: Secondary | ICD-10-CM | POA: Diagnosis not present

## 2016-09-24 DIAGNOSIS — R238 Other skin changes: Secondary | ICD-10-CM | POA: Diagnosis not present

## 2016-09-24 DIAGNOSIS — L03115 Cellulitis of right lower limb: Secondary | ICD-10-CM | POA: Diagnosis not present

## 2016-09-24 DIAGNOSIS — I5033 Acute on chronic diastolic (congestive) heart failure: Secondary | ICD-10-CM | POA: Diagnosis not present

## 2016-09-24 DIAGNOSIS — L03116 Cellulitis of left lower limb: Secondary | ICD-10-CM | POA: Diagnosis not present

## 2016-09-24 DIAGNOSIS — I4891 Unspecified atrial fibrillation: Secondary | ICD-10-CM | POA: Diagnosis not present

## 2016-09-25 DIAGNOSIS — I110 Hypertensive heart disease with heart failure: Secondary | ICD-10-CM | POA: Diagnosis not present

## 2016-09-25 DIAGNOSIS — G4733 Obstructive sleep apnea (adult) (pediatric): Secondary | ICD-10-CM | POA: Diagnosis not present

## 2016-09-25 DIAGNOSIS — I5033 Acute on chronic diastolic (congestive) heart failure: Secondary | ICD-10-CM | POA: Diagnosis not present

## 2016-09-25 DIAGNOSIS — I4891 Unspecified atrial fibrillation: Secondary | ICD-10-CM | POA: Diagnosis not present

## 2016-09-25 DIAGNOSIS — Z87891 Personal history of nicotine dependence: Secondary | ICD-10-CM | POA: Diagnosis not present

## 2016-09-25 DIAGNOSIS — Z4800 Encounter for change or removal of nonsurgical wound dressing: Secondary | ICD-10-CM | POA: Diagnosis not present

## 2016-09-25 DIAGNOSIS — S80821D Blister (nonthermal), right lower leg, subsequent encounter: Secondary | ICD-10-CM | POA: Diagnosis not present

## 2016-09-25 DIAGNOSIS — Z7901 Long term (current) use of anticoagulants: Secondary | ICD-10-CM | POA: Diagnosis not present

## 2016-09-25 DIAGNOSIS — D649 Anemia, unspecified: Secondary | ICD-10-CM | POA: Diagnosis not present

## 2016-09-25 DIAGNOSIS — E1169 Type 2 diabetes mellitus with other specified complication: Secondary | ICD-10-CM | POA: Diagnosis not present

## 2016-09-28 DIAGNOSIS — I4891 Unspecified atrial fibrillation: Secondary | ICD-10-CM | POA: Diagnosis not present

## 2016-09-28 DIAGNOSIS — D649 Anemia, unspecified: Secondary | ICD-10-CM | POA: Diagnosis not present

## 2016-09-28 DIAGNOSIS — E1169 Type 2 diabetes mellitus with other specified complication: Secondary | ICD-10-CM | POA: Diagnosis not present

## 2016-09-28 DIAGNOSIS — I110 Hypertensive heart disease with heart failure: Secondary | ICD-10-CM | POA: Diagnosis not present

## 2016-09-28 DIAGNOSIS — I5033 Acute on chronic diastolic (congestive) heart failure: Secondary | ICD-10-CM | POA: Diagnosis not present

## 2016-09-28 DIAGNOSIS — S80821D Blister (nonthermal), right lower leg, subsequent encounter: Secondary | ICD-10-CM | POA: Diagnosis not present

## 2016-10-02 DIAGNOSIS — S80821D Blister (nonthermal), right lower leg, subsequent encounter: Secondary | ICD-10-CM | POA: Diagnosis not present

## 2016-10-02 DIAGNOSIS — I4891 Unspecified atrial fibrillation: Secondary | ICD-10-CM | POA: Diagnosis not present

## 2016-10-02 DIAGNOSIS — I110 Hypertensive heart disease with heart failure: Secondary | ICD-10-CM | POA: Diagnosis not present

## 2016-10-02 DIAGNOSIS — I5033 Acute on chronic diastolic (congestive) heart failure: Secondary | ICD-10-CM | POA: Diagnosis not present

## 2016-10-02 DIAGNOSIS — E1169 Type 2 diabetes mellitus with other specified complication: Secondary | ICD-10-CM | POA: Diagnosis not present

## 2016-10-02 DIAGNOSIS — D649 Anemia, unspecified: Secondary | ICD-10-CM | POA: Diagnosis not present

## 2016-10-04 DIAGNOSIS — I4891 Unspecified atrial fibrillation: Secondary | ICD-10-CM | POA: Diagnosis not present

## 2016-10-04 DIAGNOSIS — D649 Anemia, unspecified: Secondary | ICD-10-CM | POA: Diagnosis not present

## 2016-10-04 DIAGNOSIS — E1169 Type 2 diabetes mellitus with other specified complication: Secondary | ICD-10-CM | POA: Diagnosis not present

## 2016-10-04 DIAGNOSIS — S80821D Blister (nonthermal), right lower leg, subsequent encounter: Secondary | ICD-10-CM | POA: Diagnosis not present

## 2016-10-04 DIAGNOSIS — I5033 Acute on chronic diastolic (congestive) heart failure: Secondary | ICD-10-CM | POA: Diagnosis not present

## 2016-10-04 DIAGNOSIS — I110 Hypertensive heart disease with heart failure: Secondary | ICD-10-CM | POA: Diagnosis not present

## 2016-10-05 DIAGNOSIS — I4891 Unspecified atrial fibrillation: Secondary | ICD-10-CM | POA: Diagnosis not present

## 2016-10-05 DIAGNOSIS — E1169 Type 2 diabetes mellitus with other specified complication: Secondary | ICD-10-CM | POA: Diagnosis not present

## 2016-10-05 DIAGNOSIS — S80821D Blister (nonthermal), right lower leg, subsequent encounter: Secondary | ICD-10-CM | POA: Diagnosis not present

## 2016-10-05 DIAGNOSIS — I110 Hypertensive heart disease with heart failure: Secondary | ICD-10-CM | POA: Diagnosis not present

## 2016-10-05 DIAGNOSIS — D649 Anemia, unspecified: Secondary | ICD-10-CM | POA: Diagnosis not present

## 2016-10-05 DIAGNOSIS — I5033 Acute on chronic diastolic (congestive) heart failure: Secondary | ICD-10-CM | POA: Diagnosis not present

## 2016-10-08 ENCOUNTER — Encounter: Payer: Self-pay | Admitting: Internal Medicine

## 2016-10-08 ENCOUNTER — Ambulatory Visit (INDEPENDENT_AMBULATORY_CARE_PROVIDER_SITE_OTHER): Payer: Medicare Other | Admitting: Internal Medicine

## 2016-10-08 VITALS — BP 130/72 | HR 98 | Ht 61.0 in | Wt 206.8 lb

## 2016-10-08 DIAGNOSIS — I5032 Chronic diastolic (congestive) heart failure: Secondary | ICD-10-CM | POA: Diagnosis not present

## 2016-10-08 DIAGNOSIS — I4821 Permanent atrial fibrillation: Secondary | ICD-10-CM

## 2016-10-08 DIAGNOSIS — I482 Chronic atrial fibrillation: Secondary | ICD-10-CM | POA: Diagnosis not present

## 2016-10-08 NOTE — Progress Notes (Signed)
PCP: [name], MD  The patient presents today for routine cardiology followup.  She has had several hospitalizations recently, inculding 09/06/16 for CHF.  She has diuresed substantially and is now doing much better.   Today, she denies symptoms of palpitations, chest pain, shortness of breath, orthopnea, PND, dizziness, presyncope, syncope, or neurologic sequela.   Edema is stable. The patient feels that she is tolerating medications without difficulties and is otherwise without complaint today.   Past Medical History:  Diagnosis Date  . Bladder cancer ([place])   . Bowel obstruction 08/25/2014  . Breast cancer ([place])   . Cervical cancer ([place])   . Chronic diastolic heart failure ([place])    a. Echo 5/12: Mild LVH, EF 55-60%, mild AI, mild MR, severe LAE, mild RAE, PASP 31, small pericardial effusion  . Colon cancer ([place])    "polyp" (12/08/2012)  . Diastolic CHF, chronic ([place]) 11/11/2012   Class 2b-3 2 d echo 5/12 mild LVH EF 55-50%, MILD AI, MILD MR, SEVERE LAE, mild RAE, PASP 31, SMALL PERICRADIAL EFFUSION  . DM2 (diabetes mellitus, type 2) ([place])    TYPE 2  . H/O ovarian cancer 11/11/2012    MUCINOUS CYST S/P RESECTION 35 YEARS AGO  . HCAP (healthcare-associated pneumonia) 07/19/2015  . HLD (hyperlipidemia)   . Hx of bladder cancer 11/11/2012  . Hx of cervical cancer 11/11/2012  . Hypertension   . Hypothyroidism   . OSA (obstructive sleep apnea)   . Ovarian cancer ([place])   . Permanent atrial fibrillation ([facility])    a. coumadin d/c'd => Pradaxa in 05/2012  . S/P hysterectomy 11/11/2012  . SBO (small bowel obstruction)   . SBO (small bowel obstruction) 08/25/2014  . Venous insufficiency    chronic LE edema   Past Surgical History:  Procedure Laterality Date  . APPENDECTOMY    . BLADDER SURGERY    . BREAST BIOPSY Bilateral   . BREAST LUMPECTOMY Right   . CATARACT EXTRACTION W/ INTRAOCULAR LENS  IMPLANT, BILATERAL    . EXPLORATORY LAPAROTOMY WITH ABDOMINAL MASS EXCISION     "21#  ovarian tumor; benign" (12/08/2012)  . I&D EXTREMITY Right 12/31/2012   Procedure: IRRIGATION AND DEBRIDEMENT RIGHT KNEE ULCER WITH PLACEMENT OF A CELL AND VAC ;  Surgeon: Theodoro Kos, DO;  Location: [facility];  Service: Plastics;  Laterality: Right;  . MASTECTOMY, RADICAL Left   . VAGINAL HYSTERECTOMY      Current Outpatient Prescriptions  Medication Sig Dispense Refill  . apixaban (ELIQUIS) 5 MG TABS tablet Take 1 tablet (5 mg total) by mouth 2 (two) times daily. 180 tablet 3  . bisacodyl (DULCOLAX) 10 MG suppository Place 10 mg rectally as needed for moderate constipation.    . bisacodyl (DULCOLAX) 5 MG EC tablet Take 5 mg by mouth daily.     . clobetasol cream (TEMOVATE) 2.02 % Apply 1 application topically daily as needed (rash).    Marland Kitchen diltiazem (CARDIZEM CD) 120 MG 24 hr capsule Take 3 capsules (360 mg total) by mouth daily.    . ferrous sulfate 325 (65 FE) MG tablet Take 325 mg by mouth daily with breakfast.    . furosemide (LASIX) 40 MG tablet Take 40 mg by mouth 2 (two) times daily.    Marland Kitchen levothyroxine (SYNTHROID, LEVOTHROID) 125 MCG tablet Take 125 mcg by mouth daily before breakfast.    . lisinopril (PRINIVIL,ZESTRIL) 5 MG tablet Take 5 mg by mouth daily.    . potassium chloride SA (K-DUR,KLOR-CON) 20 MEQ tablet Take  1 tablet (20 mEq total) by mouth 2 (two) times daily.    Marland Kitchen senna-docusate (SENOKOT-S) 8.6-50 MG per tablet Take 1 tablet by mouth 2 (two) times daily.    . sodium phosphate (FLEET) 7-19 GM/118ML ENEM Place 133 mLs (1 enema total) rectally daily as needed for severe constipation.  0  . UNABLE TO FIND Oxygen at 2 liters nasal cannula     No current facility-administered medications for this visit.     Allergies  Allergen Reactions  . Morphine And Related Other (See Comments)    sick  . Statins Other (See Comments)    sick  . Zetia [Ezetimibe] Other (See Comments)    Side effect too strong   . Atorvastatin     Other reaction(s): Other (See Comments) Leg swelling  .  Morphine     Other reaction(s): Vomiting (intolerance)  . Codeine Other (See Comments)    unknown    Social History   Social History  . Marital status: Widowed    Spouse name: N/A  . Number of children: N/A  . Years of education: N/A   Occupational History  . Not on file.   Social History Main Topics  . Smoking status: Former Smoker    Packs/day: 3.00    Years: 25.00    Types: Cigarettes    Quit date: 07/02/1966  . Smokeless tobacco: Never Used  . Alcohol use No  . Drug use: No  . Sexual activity: No   Other Topics Concern  . Not on file   Social History Narrative  . No narrative on file     Physical Exam: Vitals:   10/08/16 1054  BP: 130/72  Pulse: 98  SpO2: 97%  Weight: 206 lb 12.8 oz (93.8 kg)  Height: 5\' 1"  (1.549 m)    GEN- The patient is elderly appearing, alert and oriented x 3 today.   Head- normocephalic, atraumatic Eyes-  Sclera clear, conjunctiva pink Ears- hearing intact Oropharynx- clear Neck- supple, no JVD Lungs- Clear to ausculation bilaterally, normal work of breathing Heart- irregular rate and rhythm, 2/6 SEM LUSB which is late peaking, A2 is still audible GI- soft, NT, ND, + BS Extremities- no clubbing, cyanosis, 1+ BLE edema with legs wrapped today Neuro- strength and sensation are intact  ekg today reveals afib, V rate 98 bpm Echo 2017 is reviewed  Assessment and Plan:  1. Permanent afib Doing well on eliquis Labs from primary care reviewed  2. Chronic diastolic dysfunction Stable No change required today 2 gram sodium diet bmet today  3. HTN Stable No change required today  Return in 2 months to see me  Thompson Grayer MD, [facility] 10/08/2016 11:10 AM

## 2016-10-08 NOTE — Patient Instructions (Addendum)
Medication Instructions:  Your physician recommends that you continue on your current medications as directed. Please refer to the Current Medication list given to you today.   Labwork: Your physician recommends that you return for lab work today: BMP   Testing/Procedures: None ordered   Follow-Up: Your physician recommends that you schedule a follow-up appointment as scheduled   Any Other Special Instructions Will Be Listed Below (If Applicable).    Low-Sodium Eating Plan Sodium, which is an element that makes up salt, helps you maintain a healthy balance of fluids in your body. Too much sodium can increase your blood pressure and cause fluid and waste to be held in your body. Your health care provider or dietitian may recommend following this plan if you have high blood pressure (hypertension), kidney disease, liver disease, or heart failure. Eating less sodium can help lower your blood pressure, reduce swelling, and protect your heart, liver, and kidneys. What are tips for following this plan? General guidelines   Most people on this plan should limit their sodium intake to 1,500-2,000 mg (milligrams) of sodium each day. Reading food labels   The Nutrition Facts label lists the amount of sodium in one serving of the food. If you eat more than one serving, you must multiply the listed amount of sodium by the number of servings.  Choose foods with less than 140 mg of sodium per serving.  Avoid foods with 300 mg of sodium or more per serving. Shopping   Look for lower-sodium products, often labeled as "low-sodium" or "no salt added."  Always check the sodium content even if foods are labeled as "unsalted" or "no salt added".  Buy fresh foods.  Avoid canned foods and premade or frozen meals.  Avoid canned, cured, or processed meats  Buy breads that have less than 80 mg of sodium per slice. Cooking   Eat more home-cooked food and less restaurant, buffet, and fast  food.  Avoid adding salt when cooking. Use salt-free seasonings or herbs instead of table salt or sea salt. Check with your health care provider or pharmacist before using salt substitutes.  Cook with plant-based oils, such as canola, sunflower, or olive oil. Meal planning   When eating at a restaurant, ask that your food be prepared with less salt or no salt, if possible.  Avoid foods that contain MSG (monosodium glutamate). MSG is sometimes added to [place] food, bouillon, and some canned foods. What foods are recommended? The items listed may not be a complete list. Talk with your dietitian about what dietary choices are best for you. Grains  Low-sodium cereals, including oats, puffed wheat and rice, and shredded wheat. Low-sodium crackers. Unsalted rice. Unsalted pasta. Low-sodium bread. Whole-grain breads and whole-grain pasta. Vegetables  Fresh or frozen vegetables. "No salt added" canned vegetables. "No salt added" tomato sauce and paste. Low-sodium or reduced-sodium tomato and vegetable juice. Fruits  Fresh, frozen, or canned fruit. Fruit juice. Meats and other protein foods  Fresh or frozen (no salt added) meat, poultry, seafood, and fish. Low-sodium canned tuna and salmon. Unsalted nuts. Dried peas, beans, and lentils without added salt. Unsalted canned beans. Eggs. Unsalted nut butters. Dairy  Milk. Soy milk. Cheese that is naturally low in sodium, such as ricotta cheese, fresh mozzarella, or Swiss cheese Low-sodium or reduced-sodium cheese. Cream cheese. Yogurt. Fats and oils  Unsalted butter. Unsalted margarine with no trans fat. Vegetable oils such as canola or olive oils. Seasonings and other foods  Fresh and dried herbs and spices.  Salt-free seasonings. Low-sodium mustard and ketchup. Sodium-free salad dressing. Sodium-free light mayonnaise. Fresh or refrigerated horseradish. Lemon juice. Vinegar. Homemade, reduced-sodium, or low-sodium soups. Unsalted popcorn and pretzels.  Low-salt or salt-free chips. What foods are not recommended? The items listed may not be a complete list. Talk with your dietitian about what dietary choices are best for you. Grains  Instant hot cereals. Bread stuffing, pancake, and biscuit mixes. Croutons. Seasoned rice or pasta mixes. Noodle soup cups. Boxed or frozen macaroni and cheese. Regular salted crackers. Self-rising flour. Vegetables  Sauerkraut, pickled vegetables, and relishes. Olives. [place] fries. Onion rings. Regular canned vegetables (not low-sodium or reduced-sodium). Regular canned tomato sauce and paste (not low-sodium or reduced-sodium). Regular tomato and vegetable juice (not low-sodium or reduced-sodium). Frozen vegetables in sauces. Meats and other protein foods  Meat or fish that is salted, canned, smoked, spiced, or pickled. Bacon, ham, sausage, hotdogs, corned beef, chipped beef, packaged lunch meats, salt pork, jerky, pickled herring, anchovies, regular canned tuna, sardines, salted nuts. Dairy  Processed cheese and cheese spreads. Cheese curds. Blue cheese. Feta cheese. String cheese. Regular cottage cheese. Buttermilk. Canned milk. Fats and oils  Salted butter. Regular margarine. Ghee. Bacon fat. Seasonings and other foods  Onion salt, garlic salt, seasoned salt, table salt, and sea salt. Canned and packaged gravies. Worcestershire sauce. Tartar sauce. Barbecue sauce. Teriyaki sauce. Soy sauce, including reduced-sodium. Steak sauce. Fish sauce. Oyster sauce. Cocktail sauce. Horseradish that you find on the shelf. Regular ketchup and mustard. Meat flavorings and tenderizers. Bouillon cubes. Hot sauce and Tabasco sauce. Premade or packaged marinades. Premade or packaged taco seasonings. Relishes. Regular salad dressings. Salsa. Potato and tortilla chips. Corn chips and puffs. Salted popcorn and pretzels. Canned or dried soups. Pizza. Frozen entrees and pot pies. Summary  Eating less sodium can help lower your blood  pressure, reduce swelling, and protect your heart, liver, and kidneys.  Most people on this plan should limit their sodium intake to 1,500-2,000 mg (milligrams) of sodium each day.  Canned, boxed, and frozen foods are high in sodium. Restaurant foods, fast foods, and pizza are also very high in sodium. You also get sodium by adding salt to food.  Try to cook at home, eat more fresh fruits and vegetables, and eat less fast food, canned, processed, or prepared foods. This information is not intended to replace advice given to you by your health care provider. Make sure you discuss any questions you have with your health care provider. Document Released: 12/08/2001 Document Revised: 06/11/2016 Document Reviewed: 06/11/2016 Elsevier Interactive Patient Education  2017 [employer].

## 2016-10-09 DIAGNOSIS — I110 Hypertensive heart disease with heart failure: Secondary | ICD-10-CM | POA: Diagnosis not present

## 2016-10-09 DIAGNOSIS — I4891 Unspecified atrial fibrillation: Secondary | ICD-10-CM | POA: Diagnosis not present

## 2016-10-09 DIAGNOSIS — S80821D Blister (nonthermal), right lower leg, subsequent encounter: Secondary | ICD-10-CM | POA: Diagnosis not present

## 2016-10-09 DIAGNOSIS — I5033 Acute on chronic diastolic (congestive) heart failure: Secondary | ICD-10-CM | POA: Diagnosis not present

## 2016-10-09 DIAGNOSIS — D649 Anemia, unspecified: Secondary | ICD-10-CM | POA: Diagnosis not present

## 2016-10-09 DIAGNOSIS — E1169 Type 2 diabetes mellitus with other specified complication: Secondary | ICD-10-CM | POA: Diagnosis not present

## 2016-10-09 LAB — BASIC METABOLIC PANEL
BUN/Creatinine Ratio: 21 (ref 12–28)
BUN: 16 mg/dL (ref 10–36)
CALCIUM: 9 mg/dL (ref 8.7–10.3)
CO2: 28 mmol/L (ref 18–29)
CREATININE: 0.76 mg/dL (ref 0.57–1.00)
Chloride: 97 mmol/L (ref 96–106)
GFR, EST AFRICAN AMERICAN: 78 mL/min/{1.73_m2} (ref >59)
GFR, EST NON AFRICAN AMERICAN: 67 mL/min/{1.73_m2} (ref >59)
Glucose: 76 mg/dL (ref 65–99)
POTASSIUM: 3.5 mmol/L (ref 3.5–5.2)
Sodium: 144 mmol/L (ref 134–144)

## 2016-10-11 DIAGNOSIS — I5033 Acute on chronic diastolic (congestive) heart failure: Secondary | ICD-10-CM | POA: Diagnosis not present

## 2016-10-11 DIAGNOSIS — I110 Hypertensive heart disease with heart failure: Secondary | ICD-10-CM | POA: Diagnosis not present

## 2016-10-11 DIAGNOSIS — S80821D Blister (nonthermal), right lower leg, subsequent encounter: Secondary | ICD-10-CM | POA: Diagnosis not present

## 2016-10-11 DIAGNOSIS — D649 Anemia, unspecified: Secondary | ICD-10-CM | POA: Diagnosis not present

## 2016-10-11 DIAGNOSIS — E1169 Type 2 diabetes mellitus with other specified complication: Secondary | ICD-10-CM | POA: Diagnosis not present

## 2016-10-11 DIAGNOSIS — I4891 Unspecified atrial fibrillation: Secondary | ICD-10-CM | POA: Diagnosis not present

## 2016-10-12 DIAGNOSIS — I5033 Acute on chronic diastolic (congestive) heart failure: Secondary | ICD-10-CM | POA: Diagnosis not present

## 2016-10-12 DIAGNOSIS — I110 Hypertensive heart disease with heart failure: Secondary | ICD-10-CM | POA: Diagnosis not present

## 2016-10-12 DIAGNOSIS — D649 Anemia, unspecified: Secondary | ICD-10-CM | POA: Diagnosis not present

## 2016-10-12 DIAGNOSIS — S80821D Blister (nonthermal), right lower leg, subsequent encounter: Secondary | ICD-10-CM | POA: Diagnosis not present

## 2016-10-12 DIAGNOSIS — I4891 Unspecified atrial fibrillation: Secondary | ICD-10-CM | POA: Diagnosis not present

## 2016-10-12 DIAGNOSIS — E1169 Type 2 diabetes mellitus with other specified complication: Secondary | ICD-10-CM | POA: Diagnosis not present

## 2016-10-16 DIAGNOSIS — I5033 Acute on chronic diastolic (congestive) heart failure: Secondary | ICD-10-CM | POA: Diagnosis not present

## 2016-10-16 DIAGNOSIS — D649 Anemia, unspecified: Secondary | ICD-10-CM | POA: Diagnosis not present

## 2016-10-16 DIAGNOSIS — I4891 Unspecified atrial fibrillation: Secondary | ICD-10-CM | POA: Diagnosis not present

## 2016-10-16 DIAGNOSIS — E1169 Type 2 diabetes mellitus with other specified complication: Secondary | ICD-10-CM | POA: Diagnosis not present

## 2016-10-16 DIAGNOSIS — S80821D Blister (nonthermal), right lower leg, subsequent encounter: Secondary | ICD-10-CM | POA: Diagnosis not present

## 2016-10-16 DIAGNOSIS — I110 Hypertensive heart disease with heart failure: Secondary | ICD-10-CM | POA: Diagnosis not present

## 2016-10-17 DIAGNOSIS — D649 Anemia, unspecified: Secondary | ICD-10-CM | POA: Diagnosis not present

## 2016-10-17 DIAGNOSIS — I5033 Acute on chronic diastolic (congestive) heart failure: Secondary | ICD-10-CM | POA: Diagnosis not present

## 2016-10-17 DIAGNOSIS — E1169 Type 2 diabetes mellitus with other specified complication: Secondary | ICD-10-CM | POA: Diagnosis not present

## 2016-10-17 DIAGNOSIS — I4891 Unspecified atrial fibrillation: Secondary | ICD-10-CM | POA: Diagnosis not present

## 2016-10-17 DIAGNOSIS — S80821D Blister (nonthermal), right lower leg, subsequent encounter: Secondary | ICD-10-CM | POA: Diagnosis not present

## 2016-10-17 DIAGNOSIS — I110 Hypertensive heart disease with heart failure: Secondary | ICD-10-CM | POA: Diagnosis not present

## 2016-10-19 DIAGNOSIS — I5033 Acute on chronic diastolic (congestive) heart failure: Secondary | ICD-10-CM | POA: Diagnosis not present

## 2016-10-19 DIAGNOSIS — D649 Anemia, unspecified: Secondary | ICD-10-CM | POA: Diagnosis not present

## 2016-10-19 DIAGNOSIS — E1169 Type 2 diabetes mellitus with other specified complication: Secondary | ICD-10-CM | POA: Diagnosis not present

## 2016-10-19 DIAGNOSIS — I110 Hypertensive heart disease with heart failure: Secondary | ICD-10-CM | POA: Diagnosis not present

## 2016-10-19 DIAGNOSIS — I4891 Unspecified atrial fibrillation: Secondary | ICD-10-CM | POA: Diagnosis not present

## 2016-10-19 DIAGNOSIS — S80821D Blister (nonthermal), right lower leg, subsequent encounter: Secondary | ICD-10-CM | POA: Diagnosis not present

## 2016-10-23 DIAGNOSIS — I5033 Acute on chronic diastolic (congestive) heart failure: Secondary | ICD-10-CM | POA: Diagnosis not present

## 2016-10-23 DIAGNOSIS — I4891 Unspecified atrial fibrillation: Secondary | ICD-10-CM | POA: Diagnosis not present

## 2016-10-23 DIAGNOSIS — D649 Anemia, unspecified: Secondary | ICD-10-CM | POA: Diagnosis not present

## 2016-10-23 DIAGNOSIS — I110 Hypertensive heart disease with heart failure: Secondary | ICD-10-CM | POA: Diagnosis not present

## 2016-10-23 DIAGNOSIS — S80821D Blister (nonthermal), right lower leg, subsequent encounter: Secondary | ICD-10-CM | POA: Diagnosis not present

## 2016-10-23 DIAGNOSIS — E1169 Type 2 diabetes mellitus with other specified complication: Secondary | ICD-10-CM | POA: Diagnosis not present

## 2016-10-25 DIAGNOSIS — I4891 Unspecified atrial fibrillation: Secondary | ICD-10-CM | POA: Diagnosis not present

## 2016-10-25 DIAGNOSIS — D649 Anemia, unspecified: Secondary | ICD-10-CM | POA: Diagnosis not present

## 2016-10-25 DIAGNOSIS — I5033 Acute on chronic diastolic (congestive) heart failure: Secondary | ICD-10-CM | POA: Diagnosis not present

## 2016-10-25 DIAGNOSIS — I110 Hypertensive heart disease with heart failure: Secondary | ICD-10-CM | POA: Diagnosis not present

## 2016-10-25 DIAGNOSIS — E1169 Type 2 diabetes mellitus with other specified complication: Secondary | ICD-10-CM | POA: Diagnosis not present

## 2016-10-25 DIAGNOSIS — S80821D Blister (nonthermal), right lower leg, subsequent encounter: Secondary | ICD-10-CM | POA: Diagnosis not present

## 2016-10-26 DIAGNOSIS — I5033 Acute on chronic diastolic (congestive) heart failure: Secondary | ICD-10-CM | POA: Diagnosis not present

## 2016-10-26 DIAGNOSIS — I4891 Unspecified atrial fibrillation: Secondary | ICD-10-CM | POA: Diagnosis not present

## 2016-10-26 DIAGNOSIS — I110 Hypertensive heart disease with heart failure: Secondary | ICD-10-CM | POA: Diagnosis not present

## 2016-10-26 DIAGNOSIS — D649 Anemia, unspecified: Secondary | ICD-10-CM | POA: Diagnosis not present

## 2016-10-26 DIAGNOSIS — S80821D Blister (nonthermal), right lower leg, subsequent encounter: Secondary | ICD-10-CM | POA: Diagnosis not present

## 2016-10-26 DIAGNOSIS — E1169 Type 2 diabetes mellitus with other specified complication: Secondary | ICD-10-CM | POA: Diagnosis not present

## 2016-10-30 DIAGNOSIS — I4891 Unspecified atrial fibrillation: Secondary | ICD-10-CM | POA: Diagnosis not present

## 2016-10-30 DIAGNOSIS — S80821D Blister (nonthermal), right lower leg, subsequent encounter: Secondary | ICD-10-CM | POA: Diagnosis not present

## 2016-10-30 DIAGNOSIS — F419 Anxiety disorder, unspecified: Secondary | ICD-10-CM | POA: Diagnosis not present

## 2016-10-30 DIAGNOSIS — I5033 Acute on chronic diastolic (congestive) heart failure: Secondary | ICD-10-CM | POA: Diagnosis not present

## 2016-10-30 DIAGNOSIS — F321 Major depressive disorder, single episode, moderate: Secondary | ICD-10-CM | POA: Diagnosis not present

## 2016-10-30 DIAGNOSIS — I110 Hypertensive heart disease with heart failure: Secondary | ICD-10-CM | POA: Diagnosis not present

## 2016-10-30 DIAGNOSIS — E1169 Type 2 diabetes mellitus with other specified complication: Secondary | ICD-10-CM | POA: Diagnosis not present

## 2016-10-30 DIAGNOSIS — D649 Anemia, unspecified: Secondary | ICD-10-CM | POA: Diagnosis not present

## 2016-11-01 DIAGNOSIS — I4891 Unspecified atrial fibrillation: Secondary | ICD-10-CM | POA: Diagnosis not present

## 2016-11-01 DIAGNOSIS — S80821D Blister (nonthermal), right lower leg, subsequent encounter: Secondary | ICD-10-CM | POA: Diagnosis not present

## 2016-11-01 DIAGNOSIS — D649 Anemia, unspecified: Secondary | ICD-10-CM | POA: Diagnosis not present

## 2016-11-01 DIAGNOSIS — I110 Hypertensive heart disease with heart failure: Secondary | ICD-10-CM | POA: Diagnosis not present

## 2016-11-01 DIAGNOSIS — I5033 Acute on chronic diastolic (congestive) heart failure: Secondary | ICD-10-CM | POA: Diagnosis not present

## 2016-11-01 DIAGNOSIS — E1169 Type 2 diabetes mellitus with other specified complication: Secondary | ICD-10-CM | POA: Diagnosis not present

## 2016-11-02 DIAGNOSIS — I110 Hypertensive heart disease with heart failure: Secondary | ICD-10-CM | POA: Diagnosis not present

## 2016-11-02 DIAGNOSIS — D649 Anemia, unspecified: Secondary | ICD-10-CM | POA: Diagnosis not present

## 2016-11-02 DIAGNOSIS — E1169 Type 2 diabetes mellitus with other specified complication: Secondary | ICD-10-CM | POA: Diagnosis not present

## 2016-11-02 DIAGNOSIS — I5033 Acute on chronic diastolic (congestive) heart failure: Secondary | ICD-10-CM | POA: Diagnosis not present

## 2016-11-02 DIAGNOSIS — S80821D Blister (nonthermal), right lower leg, subsequent encounter: Secondary | ICD-10-CM | POA: Diagnosis not present

## 2016-11-02 DIAGNOSIS — I4891 Unspecified atrial fibrillation: Secondary | ICD-10-CM | POA: Diagnosis not present

## 2016-11-06 DIAGNOSIS — D649 Anemia, unspecified: Secondary | ICD-10-CM | POA: Diagnosis not present

## 2016-11-06 DIAGNOSIS — E1169 Type 2 diabetes mellitus with other specified complication: Secondary | ICD-10-CM | POA: Diagnosis not present

## 2016-11-06 DIAGNOSIS — I4891 Unspecified atrial fibrillation: Secondary | ICD-10-CM | POA: Diagnosis not present

## 2016-11-06 DIAGNOSIS — F321 Major depressive disorder, single episode, moderate: Secondary | ICD-10-CM | POA: Diagnosis not present

## 2016-11-06 DIAGNOSIS — I110 Hypertensive heart disease with heart failure: Secondary | ICD-10-CM | POA: Diagnosis not present

## 2016-11-06 DIAGNOSIS — F419 Anxiety disorder, unspecified: Secondary | ICD-10-CM | POA: Diagnosis not present

## 2016-11-06 DIAGNOSIS — S80821D Blister (nonthermal), right lower leg, subsequent encounter: Secondary | ICD-10-CM | POA: Diagnosis not present

## 2016-11-06 DIAGNOSIS — I5033 Acute on chronic diastolic (congestive) heart failure: Secondary | ICD-10-CM | POA: Diagnosis not present

## 2016-11-07 DIAGNOSIS — E118 Type 2 diabetes mellitus with unspecified complications: Secondary | ICD-10-CM | POA: Diagnosis not present

## 2016-11-07 DIAGNOSIS — I509 Heart failure, unspecified: Secondary | ICD-10-CM | POA: Diagnosis not present

## 2016-11-07 DIAGNOSIS — E7800 Pure hypercholesterolemia, unspecified: Secondary | ICD-10-CM | POA: Diagnosis not present

## 2016-11-07 DIAGNOSIS — E039 Hypothyroidism, unspecified: Secondary | ICD-10-CM | POA: Diagnosis not present

## 2016-11-07 DIAGNOSIS — D696 Thrombocytopenia, unspecified: Secondary | ICD-10-CM | POA: Diagnosis not present

## 2016-11-07 DIAGNOSIS — I10 Essential (primary) hypertension: Secondary | ICD-10-CM | POA: Diagnosis not present

## 2016-11-07 DIAGNOSIS — I4891 Unspecified atrial fibrillation: Secondary | ICD-10-CM | POA: Diagnosis not present

## 2016-11-07 DIAGNOSIS — D510 Vitamin B12 deficiency anemia due to intrinsic factor deficiency: Secondary | ICD-10-CM | POA: Diagnosis not present

## 2016-11-07 DIAGNOSIS — R609 Edema, unspecified: Secondary | ICD-10-CM | POA: Diagnosis not present

## 2016-11-08 DIAGNOSIS — E1169 Type 2 diabetes mellitus with other specified complication: Secondary | ICD-10-CM | POA: Diagnosis not present

## 2016-11-08 DIAGNOSIS — D649 Anemia, unspecified: Secondary | ICD-10-CM | POA: Diagnosis not present

## 2016-11-08 DIAGNOSIS — I110 Hypertensive heart disease with heart failure: Secondary | ICD-10-CM | POA: Diagnosis not present

## 2016-11-08 DIAGNOSIS — I4891 Unspecified atrial fibrillation: Secondary | ICD-10-CM | POA: Diagnosis not present

## 2016-11-08 DIAGNOSIS — I5033 Acute on chronic diastolic (congestive) heart failure: Secondary | ICD-10-CM | POA: Diagnosis not present

## 2016-11-08 DIAGNOSIS — F341 Dysthymic disorder: Secondary | ICD-10-CM | POA: Diagnosis not present

## 2016-11-08 DIAGNOSIS — S80821D Blister (nonthermal), right lower leg, subsequent encounter: Secondary | ICD-10-CM | POA: Diagnosis not present

## 2016-12-04 DIAGNOSIS — F419 Anxiety disorder, unspecified: Secondary | ICD-10-CM | POA: Diagnosis not present

## 2016-12-04 DIAGNOSIS — F321 Major depressive disorder, single episode, moderate: Secondary | ICD-10-CM | POA: Diagnosis not present

## 2016-12-13 ENCOUNTER — Ambulatory Visit (INDEPENDENT_AMBULATORY_CARE_PROVIDER_SITE_OTHER): Payer: Medicare Other | Admitting: Internal Medicine

## 2016-12-13 ENCOUNTER — Encounter: Payer: Self-pay | Admitting: Internal Medicine

## 2016-12-13 VITALS — BP 132/78 | HR 87 | Ht 61.0 in | Wt 213.0 lb

## 2016-12-13 DIAGNOSIS — I5032 Chronic diastolic (congestive) heart failure: Secondary | ICD-10-CM | POA: Diagnosis not present

## 2016-12-13 DIAGNOSIS — I5033 Acute on chronic diastolic (congestive) heart failure: Secondary | ICD-10-CM | POA: Diagnosis not present

## 2016-12-13 DIAGNOSIS — I482 Chronic atrial fibrillation: Secondary | ICD-10-CM

## 2016-12-13 DIAGNOSIS — I10 Essential (primary) hypertension: Secondary | ICD-10-CM | POA: Diagnosis not present

## 2016-12-13 DIAGNOSIS — I4821 Permanent atrial fibrillation: Secondary | ICD-10-CM

## 2016-12-13 NOTE — Progress Notes (Signed)
PCP: Hulan Fess, MD  [name] ZARI CLY is a 83 y.o. female who presents today for routine electrophysiology followup.  Since last being seen in our clinic, the patient reports doing reasonably well.  She has gained about 5 lbs recently.  Her daughter feels that she has increasing edema.   Today, she denies symptoms of palpitations, chest pain, shortness of breath,  dizziness, presyncope, or syncope.  The patient is otherwise without complaint today.   Past Medical History:  Diagnosis Date  . Bladder cancer ([place])   . Bowel obstruction ([place]) 08/25/2014  . Breast cancer ([place])   . Cervical cancer ([place])   . Chronic diastolic heart failure ([place])    a. Echo 5/12: Mild LVH, EF 55-60%, mild AI, mild MR, severe LAE, mild RAE, PASP 31, small pericardial effusion  . Colon cancer ([place])    "polyp" (12/08/2012)  . Diastolic CHF, chronic ([place]) 11/11/2012   Class 2b-3 2 d echo 5/12 mild LVH EF 55-50%, MILD AI, MILD MR, SEVERE LAE, mild RAE, PASP 31, SMALL PERICRADIAL EFFUSION  . DM2 (diabetes mellitus, type 2) ([place])    TYPE 2  . H/O ovarian cancer 11/11/2012    MUCINOUS CYST S/P RESECTION 35 YEARS AGO  . HCAP (healthcare-associated pneumonia) 07/19/2015  . HLD (hyperlipidemia)   . Hx of bladder cancer 11/11/2012  . Hx of cervical cancer 11/11/2012  . Hypertension   . Hypothyroidism   . OSA (obstructive sleep apnea)   . Ovarian cancer ([place])   . Permanent atrial fibrillation ([facility])    a. coumadin d/c'd => Pradaxa in 05/2012  . S/P hysterectomy 11/11/2012  . SBO (small bowel obstruction) ([place])   . SBO (small bowel obstruction) ([place]) 08/25/2014  . Venous insufficiency    chronic LE edema   Past Surgical History:  Procedure Laterality Date  . APPENDECTOMY    . BLADDER SURGERY    . BREAST BIOPSY Bilateral   . BREAST LUMPECTOMY Right   . CATARACT EXTRACTION W/ INTRAOCULAR LENS  IMPLANT, BILATERAL    . EXPLORATORY LAPAROTOMY WITH ABDOMINAL MASS EXCISION     "21# ovarian tumor; benign" (12/08/2012)  . I&D  EXTREMITY Right 12/31/2012   Procedure: IRRIGATION AND DEBRIDEMENT RIGHT KNEE ULCER WITH PLACEMENT OF A CELL AND VAC ;  Surgeon: Theodoro Kos, DO;  Location: [facility];  Service: Plastics;  Laterality: Right;  . MASTECTOMY, RADICAL Left   . VAGINAL HYSTERECTOMY      ROS- all systems are reviewed and negatives except as per HPI above  Current Outpatient Prescriptions  Medication Sig Dispense Refill  . apixaban (ELIQUIS) 5 MG TABS tablet Take 1 tablet (5 mg total) by mouth 2 (two) times daily. 180 tablet 3  . bisacodyl (DULCOLAX) 10 MG suppository Place 10 mg rectally as needed for moderate constipation.    . bisacodyl (DULCOLAX) 5 MG EC tablet Take 5 mg by mouth daily.     . clobetasol cream (TEMOVATE) 1.96 % Apply 1 application topically daily as needed (rash).    . cyanocobalamin (,VITAMIN B-12,) 1000 MCG/ML injection Inject 1,000 mcg into the muscle as directed.    . diltiazem (CARDIZEM CD) 120 MG 24 hr capsule Take 3 capsules (360 mg total) by mouth daily.    . ferrous sulfate 325 (65 FE) MG tablet Take 325 mg by mouth daily with breakfast.    . furosemide (LASIX) 40 MG tablet Take 40 mg by mouth 2 (two) times daily.    Marland Kitchen levothyroxine (SYNTHROID, LEVOTHROID) 125 MCG tablet Take  137 mcg by mouth daily before breakfast.     . lisinopril (PRINIVIL,ZESTRIL) 5 MG tablet Take 5 mg by mouth daily.    . potassium chloride SA (K-DUR,KLOR-CON) 20 MEQ tablet Take 1 tablet (20 mEq total) by mouth 2 (two) times daily.    Marland Kitchen senna-docusate (SENOKOT-S) 8.6-50 MG per tablet Take 1 tablet by mouth 2 (two) times daily.    . sodium phosphate (FLEET) 7-19 GM/118ML ENEM Place 133 mLs (1 enema total) rectally daily as needed for severe constipation.  0  . UNABLE TO FIND Oxygen at 2 liters nasal cannula    . Zinc Oxide (BALMEX EX) Apply topically as directed.     No current facility-administered medications for this visit.     Physical Exam: Vitals:   12/13/16 1408  BP: 132/78  Pulse: 87  Weight: 213 lb  (96.6 kg)  Height: 5\' 1"  (1.549 m)    GEN- The patient is elderly appearing, alert and oriented x 3 today.   Head- normocephalic, atraumatic Eyes-  Sclera clear, conjunctiva pink Ears- hearing intact Oropharynx- clear Lungs- few basilar rales, normal work of breathing Heart- Regular rate and rhythm, 2/6 SEM LUSB which is late peaking, A2 is audible GI- soft, NT, ND, + BS Extremities- no clubbing, cyanosis, +1 edema (legs are wrapped again today)  EKG tracing ordered today is personally reviewed and shows afib,  V rate 87 bpm, nonspecific ST/T changes  Assessment and Plan:  1. Permanent afib Doing well with eliquis No changes today  2. Acute on chronic diastolic dysfunction Increased weight and volume on exam Avoid salt Will increase lasix to 80mg  BID x 4 days then return to 40mg  bid Elevate legs  3. HTN Stable No change required today  Return in 2-4 weeks to see Cecille Rubin for further assessment of CHF.  If improved, I will see again in 3 months   Thompson Grayer MD, [facility] 12/13/2016 2:38 PM

## 2016-12-13 NOTE — Patient Instructions (Signed)
Medication Instructions:  INCREASE Lasix (Furosemide)to 80 mg for 4 days then return to 40 mg twice daily   Labwork: TODAY - BMET, BNP (ok to draw in left arm per patient)   Testing/Procedures: None Ordered   Follow-Up: Your physician recommends that you schedule a follow-up appointment in: 3 weeks with Truitt Merle, NP    For your  leg edema you  should do  the following 1. Leg elevation - I recommend the Lounge Dr. Leg rest.  See below for details  2. Salt restriction  -  Use potassium chloride instead of regular salt as a salt substitute. 3. Walk regularly 4. Compression hose - [facility] 5. Weight loss     Go to [email or web address]       Low-Sodium Eating Plan Sodium, which is an element that makes up salt, helps you maintain a healthy balance of fluids in your body. Too much sodium can increase your blood pressure and cause fluid and waste to be held in your body. Your health care provider or dietitian may recommend following this plan if you have high blood pressure (hypertension), kidney disease, liver disease, or heart failure. Eating less sodium can help lower your blood pressure, reduce swelling, and protect your heart, liver, and kidneys. What are tips for following this plan? General guidelines  Most people on this plan should limit their sodium intake to 1,500-2,000 mg (milligrams) of sodium each day. Reading food labels  The Nutrition Facts label lists the amount of sodium in one serving of the food. If you eat more than one serving, you must multiply the listed amount of sodium by the number of servings.  Choose foods with less than 140 mg of sodium per serving.  Avoid foods with 300 mg of sodium or more per serving. Shopping  Look for lower-sodium products, often labeled as "low-sodium" or "no salt added."  Always check the sodium content even if foods are labeled as "unsalted" or "no salt added".  Buy fresh foods. ? Avoid canned foods  and premade or frozen meals. ? Avoid canned, cured, or processed meats  Buy breads that have less than 80 mg of sodium per slice. Cooking  Eat more home-cooked food and less restaurant, buffet, and fast food.  Avoid adding salt when cooking. Use salt-free seasonings or herbs instead of table salt or sea salt. Check with your health care provider or pharmacist before using salt substitutes.  Cook with plant-based oils, such as canola, sunflower, or olive oil. Meal planning  When eating at a restaurant, ask that your food be prepared with less salt or no salt, if possible.  Avoid foods that contain MSG (monosodium glutamate). MSG is sometimes added to [place] food, bouillon, and some canned foods. What foods are recommended? The items listed may not be a complete list. Talk with your dietitian about what dietary choices are best for you. Grains Low-sodium cereals, including oats, puffed wheat and rice, and shredded wheat. Low-sodium crackers. Unsalted rice. Unsalted pasta. Low-sodium bread. Whole-grain breads and whole-grain pasta. Vegetables Fresh or frozen vegetables. "No salt added" canned vegetables. "No salt added" tomato sauce and paste. Low-sodium or reduced-sodium tomato and vegetable juice. Fruits Fresh, frozen, or canned fruit. Fruit juice. Meats and other protein foods Fresh or frozen (no salt added) meat, poultry, seafood, and fish. Low-sodium canned tuna and salmon. Unsalted nuts. Dried peas, beans, and lentils without added salt. Unsalted canned beans. Eggs. Unsalted nut butters. Dairy Milk. Soy milk. Cheese that is naturally low in  sodium, such as ricotta cheese, fresh mozzarella, or Swiss cheese Low-sodium or reduced-sodium cheese. Cream cheese. Yogurt. Fats and oils Unsalted butter. Unsalted margarine with no trans fat. Vegetable oils such as canola or olive oils. Seasonings and other foods Fresh and dried herbs and spices. Salt-free seasonings. Low-sodium mustard and  ketchup. Sodium-free salad dressing. Sodium-free light mayonnaise. Fresh or refrigerated horseradish. Lemon juice. Vinegar. Homemade, reduced-sodium, or low-sodium soups. Unsalted popcorn and pretzels. Low-salt or salt-free chips. What foods are not recommended? The items listed may not be a complete list. Talk with your dietitian about what dietary choices are best for you. Grains Instant hot cereals. Bread stuffing, pancake, and biscuit mixes. Croutons. Seasoned rice or pasta mixes. Noodle soup cups. Boxed or frozen macaroni and cheese. Regular salted crackers. Self-rising flour. Vegetables Sauerkraut, pickled vegetables, and relishes. Olives. [place] fries. Onion rings. Regular canned vegetables (not low-sodium or reduced-sodium). Regular canned tomato sauce and paste (not low-sodium or reduced-sodium). Regular tomato and vegetable juice (not low-sodium or reduced-sodium). Frozen vegetables in sauces. Meats and other protein foods Meat or fish that is salted, canned, smoked, spiced, or pickled. Bacon, ham, sausage, hotdogs, corned beef, chipped beef, packaged lunch meats, salt pork, jerky, pickled herring, anchovies, regular canned tuna, sardines, salted nuts. Dairy Processed cheese and cheese spreads. Cheese curds. Blue cheese. Feta cheese. String cheese. Regular cottage cheese. Buttermilk. Canned milk. Fats and oils Salted butter. Regular margarine. Ghee. Bacon fat. Seasonings and other foods Onion salt, garlic salt, seasoned salt, table salt, and sea salt. Canned and packaged gravies. Worcestershire sauce. Tartar sauce. Barbecue sauce. Teriyaki sauce. Soy sauce, including reduced-sodium. Steak sauce. Fish sauce. Oyster sauce. Cocktail sauce. Horseradish that you find on the shelf. Regular ketchup and mustard. Meat flavorings and tenderizers. Bouillon cubes. Hot sauce and Tabasco sauce. Premade or packaged marinades. Premade or packaged taco seasonings. Relishes. Regular salad dressings. Salsa.  Potato and tortilla chips. Corn chips and puffs. Salted popcorn and pretzels. Canned or dried soups. Pizza. Frozen entrees and pot pies. Summary  Eating less sodium can help lower your blood pressure, reduce swelling, and protect your heart, liver, and kidneys.  Most people on this plan should limit their sodium intake to 1,500-2,000 mg (milligrams) of sodium each day.  Canned, boxed, and frozen foods are high in sodium. Restaurant foods, fast foods, and pizza are also very high in sodium. You also get sodium by adding salt to food.  Try to cook at home, eat more fresh fruits and vegetables, and eat less fast food, canned, processed, or prepared foods. This information is not intended to replace advice given to you by your health care provider. Make sure you discuss any questions you have with your health care provider. Document Released: 12/08/2001 Document Revised: 06/11/2016 Document Reviewed: 06/11/2016 Elsevier Interactive Patient Education  2017 [employer].    If you need a refill on your cardiac medications before your next appointment, please call your pharmacy.   Thank you for choosing [facility]! Christen Bame, RN [phone]

## 2016-12-14 LAB — BASIC METABOLIC PANEL
BUN/Creatinine Ratio: 25 (ref 12–28)
BUN: 19 mg/dL (ref 10–36)
CO2: 24 mmol/L (ref 20–29)
Calcium: 8.9 mg/dL (ref 8.7–10.3)
Chloride: 101 mmol/L (ref 96–106)
Creatinine, Ser: 0.76 mg/dL (ref 0.57–1.00)
GFR calc Af Amer: 77 mL/min/{1.73_m2} (ref >59)
GFR, EST NON AFRICAN AMERICAN: 67 mL/min/{1.73_m2} (ref >59)
Glucose: 88 mg/dL (ref 65–99)
POTASSIUM: 3.8 mmol/L (ref 3.5–5.2)
SODIUM: 143 mmol/L (ref 134–144)

## 2016-12-14 LAB — PRO B NATRIURETIC PEPTIDE: NT-Pro BNP: 1174 pg/mL — ABNORMAL HIGH (ref 0–738)

## 2016-12-17 ENCOUNTER — Telehealth: Payer: Self-pay | Admitting: Internal Medicine

## 2016-12-17 NOTE — Telephone Encounter (Signed)
New message     Pt c/o medication issue:  1. Name of Medication: laxis  2. How are you currently taking this medication (dosage and times per day)? unknown  3. Are you having a reaction (difficulty breathing--STAT)? no  4. What is your medication issue? rn is calling because she needs clarity on the mdication laxis   Os it 80mg  2x day and after 4 days will she continue

## 2016-12-17 NOTE — Telephone Encounter (Signed)
Patient daughter [name]) calling, would not go into details but is requesting a call from you. Thanks.

## 2016-12-17 NOTE — Telephone Encounter (Signed)
Please increase patient's Furosemide to 80mg  bid  at 8am and 2pm for 4 days then resume previous bid dosing.  [place]: [name]

## 2016-12-17 NOTE — Telephone Encounter (Signed)
Left message for [name] in regards to her Furosemide dosing

## 2016-12-18 DIAGNOSIS — F321 Major depressive disorder, single episode, moderate: Secondary | ICD-10-CM | POA: Diagnosis not present

## 2016-12-18 DIAGNOSIS — F419 Anxiety disorder, unspecified: Secondary | ICD-10-CM | POA: Diagnosis not present

## 2016-12-18 NOTE — Telephone Encounter (Signed)
Faxed over Furosemide dosing.  See previous phone note

## 2016-12-19 ENCOUNTER — Encounter: Payer: Self-pay | Admitting: Nurse Practitioner

## 2016-12-19 ENCOUNTER — Telehealth: Payer: Self-pay | Admitting: Internal Medicine

## 2016-12-19 NOTE — Telephone Encounter (Signed)
Called and spoke with [facility] and clarified order again.  Pt has only been doing once daily as this is what the AVS said.  I re-faxed order on 12/17/16 to increase to 80 mg bid and they still did not increase.  Patient will start increase dose today for 4 days.

## 2016-12-19 NOTE — Telephone Encounter (Signed)
Follow up   12-17-16 she needs clarification for pt laxis and she was seen on 12-13-16   And she needs a call back from rn,  she is still confused with the order   She said its a lot of laxis that is ordered and she just want to speak to rn to make sure its correct

## 2016-12-25 DIAGNOSIS — F321 Major depressive disorder, single episode, moderate: Secondary | ICD-10-CM | POA: Diagnosis not present

## 2016-12-25 DIAGNOSIS — F419 Anxiety disorder, unspecified: Secondary | ICD-10-CM | POA: Diagnosis not present

## 2017-01-01 DIAGNOSIS — F419 Anxiety disorder, unspecified: Secondary | ICD-10-CM | POA: Diagnosis not present

## 2017-01-01 DIAGNOSIS — F321 Major depressive disorder, single episode, moderate: Secondary | ICD-10-CM | POA: Diagnosis not present

## 2017-01-07 ENCOUNTER — Encounter: Payer: Self-pay | Admitting: Nurse Practitioner

## 2017-01-07 ENCOUNTER — Encounter (INDEPENDENT_AMBULATORY_CARE_PROVIDER_SITE_OTHER): Payer: Self-pay

## 2017-01-07 ENCOUNTER — Ambulatory Visit (INDEPENDENT_AMBULATORY_CARE_PROVIDER_SITE_OTHER): Payer: Medicare Other | Admitting: Nurse Practitioner

## 2017-01-07 VITALS — BP 130/90 | HR 69

## 2017-01-07 DIAGNOSIS — I482 Chronic atrial fibrillation: Secondary | ICD-10-CM | POA: Diagnosis not present

## 2017-01-07 DIAGNOSIS — I5032 Chronic diastolic (congestive) heart failure: Secondary | ICD-10-CM | POA: Diagnosis not present

## 2017-01-07 DIAGNOSIS — I4821 Permanent atrial fibrillation: Secondary | ICD-10-CM

## 2017-01-07 NOTE — Patient Instructions (Addendum)
We will be checking the following labs today - NONE   Medication Instructions:    Continue with your current medicines.     Testing/Procedures To Be Arranged:  N/A  Follow-Up:   See me in 6 months.    Other Special Instructions:   N/A    If you need a refill on your cardiac medications before your next appointment, please call your pharmacy.   Call the [facility] office at [phone] if you have any questions, problems or concerns.      

## 2017-01-07 NOTE — Progress Notes (Addendum)
CARDIOLOGY OFFICE NOTE  Date:  01/07/2017    [name] Date of Birth: 18-Feb-1920 Medical Record #[id number]  PCP:  Hulan Fess, MD  Cardiologist:  Allred  Chief Complaint  Patient presents with  . Congestive Heart Failure    Follow up visit - seen for Dr. Rayann Heman    History of Present Illness: [name] is a 83 y.o. female who presents today for a follow up visit. Seen for Dr. Rayann Heman.   She has a past medical history of permanent Afib (on Eliquis), HTN, moderate aortic stenosis, chronic diastolic CHF, HLD, OSA, and DM. Also with history of cancer - bladder, breast and ovarian.   Her last Echo 07/2015 with a LVEF of 55-60%, no wall motion abnormalities, Moderate AS, and biatrial enlargement.   The patient was admitted 07/30/16-08/07/16 for acute hypoxic respiratory failure in setting of pneumonia and CHF. Patient Was treated with IV Lasix with significant clinical improvement. Patient is net negative by almost 9.5 L. Blood pressure was intermittently soft during admission.She had afib RVR earlier in admission which was treated with addition of metoprolol. However, later developed symptomatic bradycardia in 30s to 40s. Discontinued metoprolol. Continued cardizem CD 120mg  qd. She was discharged on Lasix 40 MG twice a day for one week and then cut back to 40 MG once a day.  Saw Vin back for a post hospital visit and then has seen Dr. Rayann Heman back several times - last back about 6 weeks ago - felt to be doing ok but weight still an issue. Diuretics were increased for a few days I haven't seen here in over 2 years.   Comes in today. Here in a wheelchair. She is not able to stand here. Here with her daughter Gerald Stabs. She had labs by PCP back in May - TSh was 5.6.  Most recent BUN 19 with creatinine 0.7 from June noted. She refuses to wear her oxygen as prescribed - she does not wish to wear and does not wish to pay for it but wants to wear" just prn". Needing an order saying this  is ok. She has a power chair and has big pillows - she just refuses to elevate her legs. Her edema is progressive. Chris notes "total non compliance". Oxygen sats can be in the 80's. Getting her second dose of Lasix at 8pm. Trying to get that dose moved up but then the patient really just wants to keep everything as is. Overall, she says she is feeling ok. She is happy with how she is doing and does not wish to change anything. Tells me that "I'm probably happier inside than the 2 of you are".   Past Medical History:  Diagnosis Date  . Bladder cancer ([place])   . Bowel obstruction ([place]) 08/25/2014  . Breast cancer ([place])   . Cervical cancer ([place])   . Chronic diastolic heart failure ([place])    a. Echo 5/12: Mild LVH, EF 55-60%, mild AI, mild MR, severe LAE, mild RAE, PASP 31, small pericardial effusion  . Colon cancer ([place])    "polyp" (12/08/2012)  . Diastolic CHF, chronic ([place]) 11/11/2012   Class 2b-3 2 d echo 5/12 mild LVH EF 55-50%, MILD AI, MILD MR, SEVERE LAE, mild RAE, PASP 31, SMALL PERICRADIAL EFFUSION  . DM2 (diabetes mellitus, type 2) ([place])    TYPE 2  . H/O ovarian cancer 11/11/2012    MUCINOUS CYST S/P RESECTION 35 YEARS AGO  . HCAP (healthcare-associated pneumonia) 07/19/2015  .  HLD (hyperlipidemia)   . Hx of bladder cancer 11/11/2012  . Hx of cervical cancer 11/11/2012  . Hypertension   . Hypothyroidism   . OSA (obstructive sleep apnea)   . Ovarian cancer ([place])   . Permanent atrial fibrillation ([facility])    a. coumadin d/c'd => Pradaxa in 05/2012  . S/P hysterectomy 11/11/2012  . SBO (small bowel obstruction) ([place])   . SBO (small bowel obstruction) ([place]) 08/25/2014  . Venous insufficiency    chronic LE edema    Past Surgical History:  Procedure Laterality Date  . APPENDECTOMY    . BLADDER SURGERY    . BREAST BIOPSY Bilateral   . BREAST LUMPECTOMY Right   . CATARACT EXTRACTION W/ INTRAOCULAR LENS  IMPLANT, BILATERAL    . EXPLORATORY LAPAROTOMY WITH ABDOMINAL MASS EXCISION     "21#  ovarian tumor; benign" (12/08/2012)  . I&D EXTREMITY Right 12/31/2012   Procedure: IRRIGATION AND DEBRIDEMENT RIGHT KNEE ULCER WITH PLACEMENT OF A CELL AND VAC ;  Surgeon: Theodoro Kos, DO;  Location: [facility];  Service: Plastics;  Laterality: Right;  . MASTECTOMY, RADICAL Left   . VAGINAL HYSTERECTOMY       Medications: Current Meds  Medication Sig  . apixaban (ELIQUIS) 5 MG TABS tablet Take 1 tablet (5 mg total) by mouth 2 (two) times daily.  . bisacodyl (DULCOLAX) 10 MG suppository Place 10 mg rectally as needed for moderate constipation.  . bisacodyl (DULCOLAX) 5 MG EC tablet Take 5 mg by mouth daily.   . clobetasol cream (TEMOVATE) 6.07 % Apply 1 application topically daily as needed (rash).  . cyanocobalamin (,VITAMIN B-12,) 1000 MCG/ML injection Inject 1,000 mcg into the muscle as directed.  . diltiazem (CARDIZEM CD) 120 MG 24 hr capsule Take 3 capsules (360 mg total) by mouth daily.  . ferrous sulfate 325 (65 FE) MG tablet Take 325 mg by mouth daily with breakfast.  . furosemide (LASIX) 20 MG tablet Take 60 mg by mouth 2 (two) times daily.  Marland Kitchen levothyroxine (SYNTHROID, LEVOTHROID) 125 MCG tablet Take 137 mcg by mouth daily before breakfast.   . lisinopril (PRINIVIL,ZESTRIL) 5 MG tablet Take 5 mg by mouth daily.  . potassium chloride SA (K-DUR,KLOR-CON) 20 MEQ tablet Take 1 tablet (20 mEq total) by mouth 2 (two) times daily.  Marland Kitchen senna-docusate (SENOKOT-S) 8.6-50 MG per tablet Take 1 tablet by mouth 2 (two) times daily.  . sodium phosphate (FLEET) 7-19 GM/118ML ENEM Place 133 mLs (1 enema total) rectally daily as needed for severe constipation.  Marland Kitchen UNABLE TO FIND Oxygen at 2 liters nasal cannula  . Zinc Oxide (BALMEX EX) Apply topically as directed.     Allergies: Allergies  Allergen Reactions  . Morphine And Related Other (See Comments)    sick  . Statins Other (See Comments)    sick  . Zetia [Ezetimibe] Other (See Comments)    Side effect too strong   . Atorvastatin Nausea Only      Leg swelling  . Crestor [Rosuvastatin Calcium]     Myalgia   . Metoprolol Succinate [Metoprolol]     Bradycardia  . Morphine     Vomiting (intolerance)  . Nsaids     unknown  . Codeine Nausea And Vomiting    Social History: The patient  reports that she quit smoking about 50 years ago. Her smoking use included Cigarettes. She has a 75.00 pack-year smoking history. She has never used smokeless tobacco. She reports that she does not drink alcohol or use drugs.  Family History: The patient's family history includes Hypertension in her other.   Review of Systems: Please see the history of present illness.   Otherwise, the review of systems is positive for none.   All other systems are reviewed and negative.   Physical Exam: VS:  BP 130/90 (BP Location: Right Arm, Patient Position: Sitting, Cuff Size: Large)   Pulse 69   SpO2 94% Comment: while sitting drops to 78 .  BMI There is no height or weight on file to calculate BMI.  Wt Readings from Last 3 Encounters:  12/13/16 213 lb (96.6 kg)  10/08/16 206 lb 12.8 oz (93.8 kg)  09/06/16 206 lb 6.4 oz (93.6 kg)    General: Elderly female. Morbidly obese. She is alert and in no acute distress.  In a wheelchair.  HEENT: Normal.  Neck: Supple, no JVD, carotid bruits, or masses noted.  Cardiac: Irregular irregular rhythm. Her rate is ok. No murmurs, rubs, or gallops. Legs are wrapped and quite full with edema.  Respiratory:  Lungs are fairly clear to auscultation bilaterally with normal work of breathing.  GI: Soft and nontender.  MS: No deformity or atrophy. Gait not tested Skin: Warm and dry. Color is normal.  Neuro:  Strength and sensation are intact and no gross focal deficits noted.  Psych: Alert, appropriate and with normal affect.   LABORATORY DATA:  EKG:  EKG is not ordered today.  Lab Results  Component Value Date   WBC 4.9 09/05/2016   HGB 11.9 (L) 09/05/2016   HCT 38.2 09/05/2016   PLT 139 (L) 09/05/2016    GLUCOSE 88 12/13/2016   CHOL 173 11/04/2010   TRIG 100 11/04/2010   HDL 45 11/04/2010   LDLCALC (H) 11/04/2010    108        Total Cholesterol/HDL:CHD Risk Coronary Heart Disease Risk Table                     Men   Women  1/2 Average Risk   3.4   3.3  Average Risk       5.0   4.4  2 X Average Risk   9.6   7.1  3 X Average Risk  23.4   11.0        Use the calculated Patient Ratio above and the CHD Risk Table to determine the patient's CHD Risk.        ATP III CLASSIFICATION (LDL):  <100     mg/dL   Optimal  100-129  mg/dL   Near or Above                    Optimal  130-159  mg/dL   Borderline  160-189  mg/dL   High  >190     mg/dL   Very High   ALT 8 (L) 09/05/2016   AST 16 09/05/2016   NA 143 12/13/2016   K 3.8 12/13/2016   CL 101 12/13/2016   CREATININE 0.76 12/13/2016   BUN 19 12/13/2016   CO2 24 12/13/2016   TSH 9.573 (H) 08/06/2016   INR 1.30 12/29/2013   HGBA1C 5.6 07/31/2016     BNP (last 3 results)  Recent Labs  07/30/16 1512 09/05/16 0020  BNP 184.2* 305.5*    ProBNP (last 3 results)  Recent Labs  12/13/16 [place]*     Other Studies Reviewed Today:  Echo Study Conclusions 07/2015  - Left ventricle: The cavity size was mildly dilated.  Wall   thickness was increased in a pattern of mild LVH. Systolic   function was normal. The estimated ejection fraction was in the   range of 55% to 60%. Wall motion was normal; there were no   regional wall motion abnormalities. - Aortic valve: Valve mobility was restricted. There was moderate   stenosis. - Mitral valve: Calcified annulus. Mildly thickened leaflets .   There was moderate regurgitation. - Left atrium: The atrium was severely dilated. - Right atrium: The atrium was mildly dilated. - Pulmonary arteries: Systolic pressure was moderately increased.   PA peak pressure: 61 mm Hg (S). - Pericardium, extracardiac: A small pericardial effusion was   identified.  Impressions:  -  Normal LV systolic function; biatrial enlargement; heavily   calcified aortic valve with moderate AS by mean gradient;   moderate MR; mild TR with moderately elevated pulmonary pressure;   small pericardial effusion.   Assessment/Plan:  1. Permanent afib - managed with rate control and anticoagulation  2. Chronic diastolic HF - she is quite non compliant. Has elected to continue with current plan of care. Overall prognosis is poor given her age but she seems to be holding her own. Really nothing to add. She wishes to stay on her current dosing and time schedule of her Lasix. She is content with how she is doing. Would favor continued conservative management. Ok to use her oxygen prn - its what she is doing anyway.   3. Chronic anticoagulation - no active problems noted. CBC from March noted.   4. HTN - BP ok on current regimen.   5. Advanced age     Current medicines are reviewed with the patient today.  The patient does not have concerns regarding medicines other than what has been noted above.  The following changes have been made:  See above.  Labs/ tests ordered today include:   No orders of the defined types were placed in this encounter.    Disposition:   FU with me in 6 months. They would like to see me going forward.  Patient is agreeable to this plan and will call if any problems develop in the interim.   SignedTruitt Merle, NP  01/07/2017 3:12 PM  [facility] [address] [employer] [place], [place]  [address] Phone: [phone] Fax: [phone]

## 2017-01-08 ENCOUNTER — Telehealth: Payer: Self-pay | Admitting: *Deleted

## 2017-01-08 DIAGNOSIS — F419 Anxiety disorder, unspecified: Secondary | ICD-10-CM | POA: Diagnosis not present

## 2017-01-08 DIAGNOSIS — F321 Major depressive disorder, single episode, moderate: Secondary | ICD-10-CM | POA: Diagnosis not present

## 2017-01-08 NOTE — Telephone Encounter (Signed)
Sending [name]'s ov note from yesterday's appointment to [facility] assisted living @ [phone] phone # is [phone].

## 2017-01-10 ENCOUNTER — Telehealth: Payer: Self-pay | Admitting: Internal Medicine

## 2017-01-10 NOTE — Telephone Encounter (Signed)
Returned call and she was in a residence room.  I was asked to call back in 10 min

## 2017-01-10 NOTE — Telephone Encounter (Signed)
New message    Shawn from [facility] is calling. She needs pt oxygen order to be more specific. She ask the order state to change to use oxygen at night while asleep and during the day as needed for exertion or SOB. She also states she needs clarification on her lasix. She said they need an order that says lasix 60 mg twice a day.

## 2017-01-15 DIAGNOSIS — F321 Major depressive disorder, single episode, moderate: Secondary | ICD-10-CM | POA: Diagnosis not present

## 2017-01-15 DIAGNOSIS — F419 Anxiety disorder, unspecified: Secondary | ICD-10-CM | POA: Diagnosis not present

## 2017-01-18 NOTE — Telephone Encounter (Signed)
Follow up   . [facility] needs a order for PRN oxygen to be more specific and she also needs clarification on the order for the patients Lasix .

## 2017-01-18 NOTE — Telephone Encounter (Signed)
   May use Oxygen when ambulating and on exertion as needed  Discontinue Furosemide 40mg  and change to Furosemide 60 mg twice daily  [place]

## 2017-01-30 DIAGNOSIS — M25551 Pain in right hip: Secondary | ICD-10-CM | POA: Diagnosis not present

## 2017-01-30 DIAGNOSIS — I482 Chronic atrial fibrillation: Secondary | ICD-10-CM | POA: Diagnosis not present

## 2017-01-30 DIAGNOSIS — E119 Type 2 diabetes mellitus without complications: Secondary | ICD-10-CM | POA: Diagnosis not present

## 2017-01-30 DIAGNOSIS — Z7901 Long term (current) use of anticoagulants: Secondary | ICD-10-CM | POA: Diagnosis not present

## 2017-01-30 DIAGNOSIS — I5042 Chronic combined systolic (congestive) and diastolic (congestive) heart failure: Secondary | ICD-10-CM | POA: Diagnosis not present

## 2017-01-30 DIAGNOSIS — Z9181 History of falling: Secondary | ICD-10-CM | POA: Diagnosis not present

## 2017-01-30 DIAGNOSIS — M79604 Pain in right leg: Secondary | ICD-10-CM | POA: Diagnosis not present

## 2017-01-30 DIAGNOSIS — Z9981 Dependence on supplemental oxygen: Secondary | ICD-10-CM | POA: Diagnosis not present

## 2017-01-30 DIAGNOSIS — I110 Hypertensive heart disease with heart failure: Secondary | ICD-10-CM | POA: Diagnosis not present

## 2017-01-30 DIAGNOSIS — Z993 Dependence on wheelchair: Secondary | ICD-10-CM | POA: Diagnosis not present

## 2017-02-02 ENCOUNTER — Inpatient Hospital Stay (HOSPITAL_COMMUNITY)
Admission: EM | Admit: 2017-02-02 | Discharge: 2017-02-03 | DRG: 580 | Disposition: A | Discharge status: Home Health | Payer: Medicare Other | Attending: Family Medicine | Admitting: Family Medicine

## 2017-02-02 ENCOUNTER — Encounter (HOSPITAL_COMMUNITY): Payer: Self-pay | Admitting: *Deleted

## 2017-02-02 DIAGNOSIS — I10 Essential (primary) hypertension: Secondary | ICD-10-CM | POA: Diagnosis present

## 2017-02-02 DIAGNOSIS — L02212 Cutaneous abscess of back [any part, except buttock]: Secondary | ICD-10-CM | POA: Diagnosis not present

## 2017-02-02 DIAGNOSIS — L89159 Pressure ulcer of sacral region, unspecified stage: Secondary | ICD-10-CM | POA: Diagnosis not present

## 2017-02-02 DIAGNOSIS — Z8541 Personal history of malignant neoplasm of cervix uteri: Secondary | ICD-10-CM | POA: Diagnosis not present

## 2017-02-02 DIAGNOSIS — L02219 Cutaneous abscess of trunk, unspecified: Secondary | ICD-10-CM | POA: Diagnosis not present

## 2017-02-02 DIAGNOSIS — L03312 Cellulitis of back [any part except buttock]: Principal | ICD-10-CM | POA: Diagnosis present

## 2017-02-02 DIAGNOSIS — I482 Chronic atrial fibrillation: Secondary | ICD-10-CM

## 2017-02-02 DIAGNOSIS — Z8543 Personal history of malignant neoplasm of ovary: Secondary | ICD-10-CM | POA: Diagnosis not present

## 2017-02-02 DIAGNOSIS — Z8551 Personal history of malignant neoplasm of bladder: Secondary | ICD-10-CM

## 2017-02-02 DIAGNOSIS — E785 Hyperlipidemia, unspecified: Secondary | ICD-10-CM | POA: Diagnosis present

## 2017-02-02 DIAGNOSIS — Z85038 Personal history of other malignant neoplasm of large intestine: Secondary | ICD-10-CM

## 2017-02-02 DIAGNOSIS — Z9012 Acquired absence of left breast and nipple: Secondary | ICD-10-CM

## 2017-02-02 DIAGNOSIS — Z8249 Family history of ischemic heart disease and other diseases of the circulatory system: Secondary | ICD-10-CM | POA: Diagnosis not present

## 2017-02-02 DIAGNOSIS — L089 Local infection of the skin and subcutaneous tissue, unspecified: Secondary | ICD-10-CM | POA: Diagnosis not present

## 2017-02-02 DIAGNOSIS — Z9841 Cataract extraction status, right eye: Secondary | ICD-10-CM | POA: Diagnosis not present

## 2017-02-02 DIAGNOSIS — L8990 Pressure ulcer of unspecified site, unspecified stage: Secondary | ICD-10-CM | POA: Diagnosis present

## 2017-02-02 DIAGNOSIS — I4821 Permanent atrial fibrillation: Secondary | ICD-10-CM | POA: Diagnosis present

## 2017-02-02 DIAGNOSIS — G4733 Obstructive sleep apnea (adult) (pediatric): Secondary | ICD-10-CM | POA: Diagnosis present

## 2017-02-02 DIAGNOSIS — E118 Type 2 diabetes mellitus with unspecified complications: Secondary | ICD-10-CM | POA: Diagnosis present

## 2017-02-02 DIAGNOSIS — Z853 Personal history of malignant neoplasm of breast: Secondary | ICD-10-CM | POA: Diagnosis not present

## 2017-02-02 DIAGNOSIS — L03319 Cellulitis of trunk, unspecified: Secondary | ICD-10-CM

## 2017-02-02 DIAGNOSIS — L0390 Cellulitis, unspecified: Secondary | ICD-10-CM | POA: Diagnosis present

## 2017-02-02 DIAGNOSIS — I5032 Chronic diastolic (congestive) heart failure: Secondary | ICD-10-CM | POA: Diagnosis present

## 2017-02-02 DIAGNOSIS — Z9842 Cataract extraction status, left eye: Secondary | ICD-10-CM

## 2017-02-02 DIAGNOSIS — Z9119 Patient's noncompliance with other medical treatment and regimen: Secondary | ICD-10-CM

## 2017-02-02 DIAGNOSIS — Z885 Allergy status to narcotic agent status: Secondary | ICD-10-CM

## 2017-02-02 DIAGNOSIS — Z90710 Acquired absence of both cervix and uterus: Secondary | ICD-10-CM

## 2017-02-02 DIAGNOSIS — I110 Hypertensive heart disease with heart failure: Secondary | ICD-10-CM | POA: Diagnosis present

## 2017-02-02 DIAGNOSIS — Z888 Allergy status to other drugs, medicaments and biological substances status: Secondary | ICD-10-CM

## 2017-02-02 DIAGNOSIS — L723 Sebaceous cyst: Secondary | ICD-10-CM | POA: Diagnosis present

## 2017-02-02 DIAGNOSIS — Z7901 Long term (current) use of anticoagulants: Secondary | ICD-10-CM

## 2017-02-02 DIAGNOSIS — L89151 Pressure ulcer of sacral region, stage 1: Secondary | ICD-10-CM | POA: Diagnosis present

## 2017-02-02 DIAGNOSIS — Z961 Presence of intraocular lens: Secondary | ICD-10-CM | POA: Diagnosis present

## 2017-02-02 DIAGNOSIS — E039 Hypothyroidism, unspecified: Secondary | ICD-10-CM | POA: Diagnosis not present

## 2017-02-02 DIAGNOSIS — Z87891 Personal history of nicotine dependence: Secondary | ICD-10-CM

## 2017-02-02 DIAGNOSIS — E119 Type 2 diabetes mellitus without complications: Secondary | ICD-10-CM | POA: Diagnosis present

## 2017-02-02 DIAGNOSIS — Z22322 Carrier or suspected carrier of Methicillin resistant Staphylococcus aureus: Secondary | ICD-10-CM

## 2017-02-02 DIAGNOSIS — Z993 Dependence on wheelchair: Secondary | ICD-10-CM

## 2017-02-02 DIAGNOSIS — E7800 Pure hypercholesterolemia, unspecified: Secondary | ICD-10-CM | POA: Diagnosis not present

## 2017-02-02 LAB — GLUCOSE, CAPILLARY: Glucose-Capillary: 128 mg/dL — ABNORMAL HIGH (ref 65–99)

## 2017-02-02 LAB — CBC WITH DIFFERENTIAL/PLATELET
BASOS ABS: 0 10*3/uL (ref 0.0–0.1)
BASOS PCT: 0 %
EOS ABS: 0 10*3/uL (ref 0.0–0.7)
EOS PCT: 0 %
HCT: 36 % (ref 36.0–46.0)
Hemoglobin: 11.7 g/dL — ABNORMAL LOW (ref 12.0–15.0)
Lymphocytes Relative: 13 %
Lymphs Abs: 1.4 10*3/uL (ref 0.7–4.0)
MCH: 30 pg (ref 26.0–34.0)
MCHC: 32.5 g/dL (ref 30.0–36.0)
MCV: 92.3 fL (ref 78.0–100.0)
MONO ABS: 1.1 10*3/uL — AB (ref 0.1–1.0)
MONOS PCT: 10 %
Neutro Abs: 8.2 10*3/uL — ABNORMAL HIGH (ref 1.7–7.7)
Neutrophils Relative %: 77 %
PLATELETS: 174 10*3/uL (ref 150–400)
RBC: 3.9 MIL/uL (ref 3.87–5.11)
RDW: 15.3 % (ref 11.5–15.5)
WBC: 10.7 10*3/uL — AB (ref 4.0–10.5)

## 2017-02-02 LAB — COMPREHENSIVE METABOLIC PANEL
ALBUMIN: 3.3 g/dL — AB (ref 3.5–5.0)
ALK PHOS: 69 U/L (ref 38–126)
ALT: 13 U/L — ABNORMAL LOW (ref 14–54)
AST: 18 U/L (ref 15–41)
Anion gap: 10 (ref 5–15)
BILIRUBIN TOTAL: 1 mg/dL (ref 0.3–1.2)
BUN: 29 mg/dL — ABNORMAL HIGH (ref 6–20)
CO2: 24 mmol/L (ref 22–32)
Calcium: 8.9 mg/dL (ref 8.9–10.3)
Chloride: 104 mmol/L (ref 101–111)
Creatinine, Ser: 0.95 mg/dL (ref 0.44–1.00)
GFR calc Af Amer: 57 mL/min — ABNORMAL LOW (ref >60)
GFR, EST NON AFRICAN AMERICAN: 49 mL/min — AB (ref >60)
Glucose, Bld: 103 mg/dL — ABNORMAL HIGH (ref 65–99)
Potassium: 4.1 mmol/L (ref 3.5–5.1)
Sodium: 138 mmol/L (ref 135–145)
TOTAL PROTEIN: 6.5 g/dL (ref 6.5–8.1)

## 2017-02-02 LAB — I-STAT CG4 LACTIC ACID, ED: LACTIC ACID, VENOUS: 1.5 mmol/L (ref 0.5–1.9)

## 2017-02-02 MED ORDER — FLEET ENEMA 7-19 GM/118ML RE ENEM: 1.0000 | ENEMA | Freq: Every day | RECTAL | Status: DC | PRN

## 2017-02-02 MED ORDER — LEVOTHYROXINE SODIUM 25 MCG PO TABS
137.0000 ug | ORAL_TABLET | Freq: Every day | ORAL | Status: DC
  Administered 2017-02-03: 08:00:00 137 ug via ORAL
  Filled 2017-02-02: qty 1

## 2017-02-02 MED ORDER — ONDANSETRON HCL 4 MG/2ML IJ SOLN: 4.0000 mg | Freq: Once | INTRAMUSCULAR | Status: DC

## 2017-02-02 MED ORDER — INSULIN ASPART 100 UNIT/ML ~~LOC~~ SOLN: 0.0000 [IU] | Freq: Three times a day (TID) | SUBCUTANEOUS | Status: DC

## 2017-02-02 MED ORDER — VANCOMYCIN HCL IN DEXTROSE 1-5 GM/200ML-% IV SOLN
1000.0000 mg | INTRAVENOUS | Status: DC
Start: 2017-02-03 — End: 2017-02-03
  Filled 2017-02-02: qty 200

## 2017-02-02 MED ORDER — DILTIAZEM HCL ER COATED BEADS 180 MG PO CP24
360.0000 mg | ORAL_CAPSULE | Freq: Every day | ORAL | Status: DC
  Filled 2017-02-02: qty 2

## 2017-02-02 MED ORDER — LIDOCAINE-EPINEPHRINE (PF) 2 %-1:200000 IJ SOLN
20.0000 mL | Freq: Once | INTRAMUSCULAR | Status: AC
  Administered 2017-02-02: 20 mL via INTRADERMAL
  Filled 2017-02-02: qty 20

## 2017-02-02 MED ORDER — SODIUM CHLORIDE 0.9% FLUSH: 3.0000 mL | INTRAVENOUS | Status: DC | PRN

## 2017-02-02 MED ORDER — BISACODYL 5 MG PO TBEC
5.0000 mg | DELAYED_RELEASE_TABLET | Freq: Every day | ORAL | Status: DC
  Administered 2017-02-03: 5 mg via ORAL
  Filled 2017-02-02: qty 1

## 2017-02-02 MED ORDER — LIDOCAINE-PRILOCAINE 2.5-2.5 % EX CREA
TOPICAL_CREAM | Freq: Once | CUTANEOUS | Status: AC
  Administered 2017-02-02: 18:00:00 via TOPICAL
  Filled 2017-02-02: qty 5

## 2017-02-02 MED ORDER — ACETAMINOPHEN 325 MG PO TABS: 650.0000 mg | ORAL_TABLET | Freq: Four times a day (QID) | ORAL | Status: DC | PRN

## 2017-02-02 MED ORDER — ACETAMINOPHEN 650 MG RE SUPP: 650.0000 mg | Freq: Four times a day (QID) | RECTAL | Status: DC | PRN

## 2017-02-02 MED ORDER — FUROSEMIDE 40 MG PO TABS
60.0000 mg | ORAL_TABLET | Freq: Two times a day (BID) | ORAL | Status: DC
  Administered 2017-02-03: 08:00:00 60 mg via ORAL
  Filled 2017-02-02: qty 1

## 2017-02-02 MED ORDER — HYDROCODONE-ACETAMINOPHEN 5-325 MG PO TABS: 1.0000 | ORAL_TABLET | ORAL | Status: DC | PRN

## 2017-02-02 MED ORDER — POTASSIUM CHLORIDE CRYS ER 20 MEQ PO TBCR
20.0000 meq | EXTENDED_RELEASE_TABLET | Freq: Two times a day (BID) | ORAL | Status: DC
  Administered 2017-02-03 (×2): 20 meq via ORAL
  Filled 2017-02-02 (×2): qty 1

## 2017-02-02 MED ORDER — APIXABAN 5 MG PO TABS
5.0000 mg | ORAL_TABLET | Freq: Two times a day (BID) | ORAL | Status: DC
  Administered 2017-02-03 (×2): 5 mg via ORAL
  Filled 2017-02-02 (×2): qty 1

## 2017-02-02 MED ORDER — ONDANSETRON HCL 4 MG PO TABS: 4.0000 mg | ORAL_TABLET | Freq: Four times a day (QID) | ORAL | Status: DC | PRN

## 2017-02-02 MED ORDER — SODIUM CHLORIDE 0.9 % IV SOLN: 250.0000 mL | INTRAVENOUS | Status: DC | PRN

## 2017-02-02 MED ORDER — SENNOSIDES-DOCUSATE SODIUM 8.6-50 MG PO TABS
1.0000 | ORAL_TABLET | Freq: Two times a day (BID) | ORAL | Status: DC
  Administered 2017-02-03 (×2): 1 via ORAL
  Filled 2017-02-02 (×2): qty 1

## 2017-02-02 MED ORDER — FENTANYL CITRATE (PF) 100 MCG/2ML IJ SOLN
100.0000 ug | Freq: Once | INTRAMUSCULAR | Status: AC
  Administered 2017-02-02: 100 ug via INTRAVENOUS
  Filled 2017-02-02: qty 2

## 2017-02-02 MED ORDER — INSULIN ASPART 100 UNIT/ML ~~LOC~~ SOLN: 0.0000 [IU] | Freq: Every day | SUBCUTANEOUS | Status: DC

## 2017-02-02 MED ORDER — BISACODYL 10 MG RE SUPP: 10.0000 mg | RECTAL | Status: DC | PRN

## 2017-02-02 MED ORDER — ONDANSETRON HCL 4 MG/2ML IJ SOLN: 4.0000 mg | Freq: Four times a day (QID) | INTRAMUSCULAR | Status: DC | PRN

## 2017-02-02 MED ORDER — VANCOMYCIN HCL IN DEXTROSE 1-5 GM/200ML-% IV SOLN
1000.0000 mg | Freq: Once | INTRAVENOUS | Status: AC
  Administered 2017-02-02: 1000 mg via INTRAVENOUS
  Filled 2017-02-02: qty 200

## 2017-02-02 MED ORDER — SODIUM CHLORIDE 0.9% FLUSH
3.0000 mL | Freq: Two times a day (BID) | INTRAVENOUS | Status: DC
  Administered 2017-02-03: 3 mL via INTRAVENOUS

## 2017-02-02 NOTE — ED Notes (Signed)
Dressing covering lanced abscess on back saturated in blood. Dressing removed and redressed; will continue to assess.

## 2017-02-02 NOTE — ED Notes (Signed)
Attempted PIV x 1. 2nd RN to attempt.

## 2017-02-02 NOTE — H&P (Signed)
[name]:[id number] DOB: [birth year] DOA: 02/02/2017     PCP: Hulan Fess, MD   Outpatient Specialists: Cardiology Patient coming from:  From facility assisted living [place]  Chief Complaint: Redness and swelling possible abscess on her back  HPI: [name] is a 83 y.o. female with medical history significant of bladder cancer, breast cancer, cervical cancer, chronic diastolic CHF, DM 2, HLD, HTN,  OSA, atrial fibrillation on chronic anticoagulation    Presented with one-week history of swelling and redness and pain of her back she had a sebaceous cyst there that has been aggravated by a fall. She states it caused her to have it burst. Since then she have had some drainage and redness and warmth she was sent to [facility] ER from assisted living for IV antibiotics In ED patient undergone I&D of a lesion with copious some moderate purulent drainage wound was packed and patient was started on vancomycin with plan to admit for father IV antibiotics.  She denies any fevers or chills chest pain shortness of breath at home.  Regarding pertinent Chronic problems: Regarding diastolic CHF last echo gram 2016 showed mild LVH preserved EF by cardiology. Maintained on Lasix  A.fib on eliquis and diltiazem last admission was from January 8 26 of February 2018 she required diuresis during that time was noted to be bradycardic down to 30s 40s and her metoprolol was discontinued that she continue on Cardizem. Patient has history of noncompliance refuses to wear her oxygen prescribed oxygen sometimes runs down and 80s IN ER:  Temp (24hrs), Avg:97.4 F (36.3 C), Min:97.4 F (36.3 C), Max:97.4 F (36.3 C)      on arrival  ED Triage Vitals [02/02/17 1532]  Enc Vitals Group     BP 112/90     Pulse Rate 81     Resp (!) 22     Temp (!) 97.4 F (36.3 C)     Temp Source Oral     SpO2 94 %     Weight      Height      Head Circumference      Peak Flow      Pain Score 9     Pain  Loc      Pain Edu?      Excl. in [place]?   RR 21 97% HR 68 BP  95/49  Lactic acid 1.5 Na 138 K 4.1 BUN 29 Cr 0.95 Alb 3.3 WBC 10.7 Hg 11.7 Plt 174  Following Medications were ordered in ER: Medications  vancomycin (VANCOCIN) IVPB 1000 mg/200 mL premix (1,000 mg Intravenous New Bag/Given 02/02/17 1855)  lidocaine-prilocaine (EMLA) cream ( Topical Given 02/02/17 1731)  lidocaine-EPINEPHrine (XYLOCAINE W/EPI) 2 %-1:200000 (PF) injection 20 mL (20 mLs Intradermal Given 02/02/17 1731)  fentaNYL (SUBLIMAZE) injection 100 mcg (100 mcg Intravenous Given 02/02/17 1727)      Hospitalist was called for admission for Abscess cellulitis of of the upper back  Review of Systems:    Pertinent positives include: fatigue,Bilateral lower extremity swelling   Constitutional:  No weight loss, night sweats, Fevers, chills,  weight loss  HEENT:  No headaches, Difficulty swallowing,Tooth/dental problems,Sore throat,  No sneezing, itching, ear ache, nasal congestion, post nasal drip,  Cardio-vascular:  No chest pain, Orthopnea, PND, anasarca, dizziness, palpitations.no  GI:  No heartburn, indigestion, abdominal pain, nausea, vomiting, diarrhea, change in bowel habits, loss of appetite, melena, blood in stool, hematemesis Resp:  no shortness of breath at rest. No dyspnea on exertion,  No excess mucus, no productive cough, No non-productive cough, No coughing up of blood.No change in color of mucus.No wheezing. Skin:  no rash or lesions. No jaundice GU:  no dysuria, change in color of urine, no urgency or frequency. No straining to urinate.  No flank pain.  Musculoskeletal:  No joint pain or no joint swelling. No decreased range of motion. No back pain.  Psych:  No change in mood or affect. No depression or anxiety. No memory loss.  Neuro: no localizing neurological complaints, no tingling, no weakness, no double vision, no gait abnormality, no slurred speech, no confusion  As per HPI otherwise 10 point review  of systems negative.   Past Medical History: Past Medical History:  Diagnosis Date  . Bladder cancer ([place])   . Bowel obstruction ([place]) 08/25/2014  . Breast cancer ([place])   . Cervical cancer ([place])   . Chronic diastolic heart failure ([place])    a. Echo 5/12: Mild LVH, EF 55-60%, mild AI, mild MR, severe LAE, mild RAE, PASP 31, small pericardial effusion  . Colon cancer ([place])    "polyp" (12/08/2012)  . Diastolic CHF, chronic ([place]) 11/11/2012   Class 2b-3 2 d echo 5/12 mild LVH EF 55-50%, MILD AI, MILD MR, SEVERE LAE, mild RAE, PASP 31, SMALL PERICRADIAL EFFUSION  . DM2 (diabetes mellitus, type 2) ([place])    TYPE 2  . H/O ovarian cancer 11/11/2012    MUCINOUS CYST S/P RESECTION 35 YEARS AGO  . HCAP (healthcare-associated pneumonia) 07/19/2015  . HLD (hyperlipidemia)   . Hx of bladder cancer 11/11/2012  . Hx of cervical cancer 11/11/2012  . Hypertension   . Hypothyroidism   . OSA (obstructive sleep apnea)   . Ovarian cancer ([place])   . Permanent atrial fibrillation (HCC)    a. coumadin d/c'd => Pradaxa in 05/2012  . S/P hysterectomy 11/11/2012  . SBO (small bowel obstruction) ([place])   . SBO (small bowel obstruction) ([place]) 08/25/2014  . Venous insufficiency    chronic LE edema   Past Surgical History:  Procedure Laterality Date  . APPENDECTOMY    . BLADDER SURGERY    . BREAST BIOPSY Bilateral   . BREAST LUMPECTOMY Right   . CATARACT EXTRACTION W/ INTRAOCULAR LENS  IMPLANT, BILATERAL    . EXPLORATORY LAPAROTOMY WITH ABDOMINAL MASS EXCISION     "21# ovarian tumor; benign" (12/08/2012)  . I&D EXTREMITY Right 12/31/2012   Procedure: IRRIGATION AND DEBRIDEMENT RIGHT KNEE ULCER WITH PLACEMENT OF A CELL AND VAC ;  Surgeon: Theodoro Kos, DO;  Location: [facility]S;  Service: Plastics;  Laterality: Right;  . MASTECTOMY, RADICAL Left   . VAGINAL HYSTERECTOMY       Social History:  Ambulatory    Walker or wheelchair bound     reports that she quit smoking about 50 years ago. Her smoking use included  Cigarettes. She has a 75.00 pack-year smoking history. She has never used smokeless tobacco. She reports that she does not drink alcohol or use drugs.  Allergies:   Allergies  Allergen Reactions  . Morphine And Related Other (See Comments)    sick  . Statins Other (See Comments)    sick  . Zetia [Ezetimibe] Other (See Comments)    Side effect too strong   . Atorvastatin Nausea Only    Leg swelling  . Crestor [Rosuvastatin Calcium]     Myalgia   . Metoprolol Succinate [Metoprolol]     Bradycardia  . Morphine     Vomiting (intolerance)  .  Nsaids     unknown  . Codeine Nausea And Vomiting       Family History:   Family History  Problem Relation Age of Onset  . Hypertension Other     Medications: Prior to Admission medications   Medication Sig Start Date End Date Taking? Authorizing Provider  apixaban (ELIQUIS) 5 MG TABS tablet Take 1 tablet (5 mg total) by mouth 2 (two) times daily. 01/18/14  Yes Allred, Jeneen Rinks, MD  bisacodyl (DULCOLAX) 10 MG suppository Place 10 mg rectally as needed for moderate constipation.   Yes [provider]  bisacodyl (DULCOLAX) 5 MG EC tablet Take 5 mg by mouth daily.    Yes [provider]  clobetasol cream (TEMOVATE) 7.84 % Apply 1 application topically daily as needed (rash).   Yes [provider]  diltiazem (CARDIZEM CD) 120 MG 24 hr capsule Take 3 capsules (360 mg total) by mouth daily. 09/06/16  Yes Rama, Venetia Maxon, MD  ferrous sulfate 325 (65 FE) MG tablet Take 325 mg by mouth daily with breakfast.   Yes [provider]  furosemide (LASIX) 20 MG tablet Take 60 mg by mouth 2 (two) times daily.   Yes [provider]  levothyroxine (SYNTHROID, LEVOTHROID) 137 MCG tablet Take 137 mcg by mouth daily before breakfast.   Yes [provider]  lisinopril (PRINIVIL,ZESTRIL) 5 MG tablet Take 5 mg by mouth daily.   Yes [provider]  potassium chloride SA (K-DUR,KLOR-CON) 20 MEQ tablet Take  1 tablet (20 mEq total) by mouth 2 (two) times daily. 09/06/16  Yes Rama, Venetia Maxon, MD  senna-docusate (SENOKOT-S) 8.6-50 MG per tablet Take 1 tablet by mouth 2 (two) times daily. 08/31/14  Yes Vann, Jessica U, DO  sodium phosphate (FLEET) 7-19 GM/118ML ENEM Place 133 mLs (1 enema total) rectally daily as needed for severe constipation. 08/31/14  Yes Geradine Girt, DO  UNABLE TO FIND Oxygen at 2 liters nasal cannula 09/06/16  Yes Rama, Venetia Maxon, MD  Zinc Oxide (BALMEX EX) Apply topically as directed.   Yes [provider]  cyanocobalamin (,VITAMIN B-12,) 1000 MCG/ML injection Inject 1,000 mcg into the muscle as directed.    [provider]    Physical Exam: Patient Vitals for the past 24 hrs:  BP Temp Temp src Pulse Resp SpO2  02/02/17 1730 - - - 72 (!) 24 100 %  02/02/17 1600 - - - - - 100 %  02/02/17 1532 112/90 (!) 97.4 F (36.3 C) Oral 81 (!) 22 94 %    1. General:  in No Acute distress 2. Psychological: Alert and   Oriented 3. Head/ENT:   Moist   Mucous Membranes                          Head Non traumatic, neck supple                           Poor Dentition 4. SKIN: normal   Skin turgor,  Skin clean Dry dressing over the recently drained abscess on the patient's back, sacral pressure ulcer drssed  5. Heart: Regular rate and rhythm    Murmur, Rub or gallop 6. Lungs: no wheezes or crackles   7. Abdomen: Soft,  non-tender, Non distended 8. Lower extremities: no clubbing, cyanosis, or edema 9. Neurologically Grossly intact, moving all 4 extremities equally  10. MSK: Normal range of motion   body mass  index is unknown because there is no height or weight on file.  Labs on Admission:   Labs on Admission: I have personally reviewed following labs and imaging studies  CBC:  Recent Labs Lab 02/02/17 1537  WBC 10.7*  NEUTROABS 8.2*  HGB 11.7*  HCT 36.0  MCV 92.3  PLT 185   Basic Metabolic Panel:  Recent Labs Lab 02/02/17 1537  NA 138  K 4.1  CL  104  CO2 24  GLUCOSE 103*  BUN 29*  CREATININE 0.95  CALCIUM 8.9   GFR: CrCl cannot be calculated (Unknown ideal weight.). Liver Function Tests:  Recent Labs Lab 02/02/17 1537  AST 18  ALT 13*  ALKPHOS 69  BILITOT 1.0  PROT 6.5  ALBUMIN 3.3*   No results for input(s): LIPASE, AMYLASE in the last 168 hours. No results for input(s): AMMONIA in the last 168 hours. Coagulation Profile: No results for input(s): INR, PROTIME in the last 168 hours. Cardiac Enzymes: No results for input(s): CKTOTAL, CKMB, CKMBINDEX, TROPONINI in the last 168 hours. BNP (last 3 results)  Recent Labs  12/13/16 1457  PROBNP 1,174*   HbA1C: No results for input(s): HGBA1C in the last 72 hours. CBG: No results for input(s): GLUCAP in the last 168 hours. Lipid Profile: No results for input(s): CHOL, HDL, LDLCALC, TRIG, CHOLHDL, LDLDIRECT in the last 72 hours. Thyroid Function Tests: No results for input(s): TSH, T4TOTAL, FREET4, T3FREE, THYROIDAB in the last 72 hours. Anemia Panel: No results for input(s): VITAMINB12, FOLATE, FERRITIN, TIBC, IRON, RETICCTPCT in the last 72 hours. Urine analysis:  Sepsis Labs: @LABRCNTIP (procalcitonin:4,lacticidven:4) )No results found for this or any previous visit (from the past 240 hour(s)).    UA  not ordered  Lab Results  Component Value Date   HGBA1C 5.6 07/31/2016    CrCl cannot be calculated (Unknown ideal weight.).  BNP (last 3 results)  Recent Labs  12/13/16 1457  PROBNP 1,174*     ECG REPORT Not obtained  There were no vitals filed for this visit.   Cultures:    Component Value Date/Time   SDES BLOOD RIGHT HAND 07/30/2016 1550   SPECREQUEST BOTTLES DRAWN AEROBIC AND ANAEROBIC 5CC 07/30/2016 1550   CULT NO GROWTH 5 DAYS 07/30/2016 1550   REPTSTATUS 08/04/2016 FINAL 07/30/2016 1550     Radiological Exams on Admission: No results found.  Chart has been reviewed    Assessment/Plan  83 y.o. female with medical history  significant of bladder cancer, breast cancer, cervical cancer, chronic diastolic CHF, DM 2, HLD, HTN,  OSA, atrial fibrillation on chronic anticoagulation Admitted for  Abscess cellulitis of of the upper back  Present on Admission: . Cellulitis and abscess of trunk - continue IV antibiotics, -admit per cellulitis protocol will       vancomycin given purulent discharge,     Will obtain MRSA screening,       obtain blood cultures if febrile or septic     further antibiotic adjustment pending above results  . Diabetes mellitus with complication (HCC) - SSI ordered  . Essential hypertension - hold home medications given soft blood pressure  . Hyperlipidemia - chronic currently stable . OSA (obstructive sleep apnea) - does not want to use CPAP   .Chronic Atrial fibrillation (HCC)        - CHA2DS2 vas score 7 : continue current anticoagulation with Eliquis           -  Rate control:  Currently controlled with  Diltiazem will continue  Diastolic CHF restart Lasix - monitor for fluid overload. Has chronic leg edema at baseline . Hypothyroidism - synthroid ordered check TSH   Pressure ulcers - sacrum, order wound consult  Constipation - bowel regimen ordered  Other plan as per orders.  DVT prophylaxis:  Eliquis  Code Status:  FULL CODE as per patient    Family Communication:   Family not at  Bedside    Disposition Plan:                             Back to current facility when stable                                             Social Work    consulted                          Consults called: none    Admission status:    inpatient       Level of care     medical floor          I have spent a total of 56 min on this admission     Tauheedah Bok 02/02/2017, 9:22 PM    [facility]  Pager [phone]   after 2 AM please page floor coverage PA If 7AM-7PM, please contact the day team taking care of the patient  [email or web address]  Password [email or web address]

## 2017-02-02 NOTE — ED Notes (Signed)
Pt brief changed and bottom clean. Pressure dressing placed over sacrum and areas of breakdown. Barrier cream applied. External female catheter placed. Pt repositioned in bed and pulled up to eat dinner tray. Pt states she can eat independently. Door opened to monitor pt while she eats. Will continue to assess.

## 2017-02-02 NOTE — Progress Notes (Signed)
Pharmacy Antibiotic Note  [name] is a 83 y.o. female admitted on 02/02/2017 with cellulitis.  Pharmacy has been consulted for Vancomycin dosing. WBC 10.7. Renal function age appropriate. Purulent discharge from possible abscess on back.   Plan: Vancomycin 1000 mg IV q24h Trend WBC, temp, renal function  F/U infectious work-up Drug levels as indicated   Temp (24hrs), Avg:97.4 F (36.3 C), Min:97.4 F (36.3 C), Max:97.4 F (36.3 C)   Recent Labs Lab 02/02/17 1537 02/02/17 1556  WBC 10.7*  --   CREATININE 0.95  --   LATICACIDVEN  --  1.50    CrCl cannot be calculated (Unknown ideal weight.).    Allergies  Allergen Reactions  . Morphine And Related Other (See Comments)    sick  . Statins Other (See Comments)    sick  . Zetia [Ezetimibe] Other (See Comments)    Side effect too strong   . Atorvastatin Nausea Only    Leg swelling  . Crestor [Rosuvastatin Calcium]     Myalgia   . Metoprolol Succinate [Metoprolol]     Bradycardia  . Morphine     Vomiting (intolerance)  . Nsaids     unknown  . Codeine Nausea And Vomiting    Narda Bonds 02/02/2017 10:32 PM

## 2017-02-02 NOTE — ED Notes (Signed)
Next room!!

## 2017-02-02 NOTE — ED Triage Notes (Signed)
Family reports pt having abscess on her back for extended amount of time. Had recent fall that caused her abscess to burst. Now has increased in size, pain and redness. Was sent here for possible IV antibiotics.

## 2017-02-02 NOTE — ED Notes (Signed)
Pt daughter/POA updated on plan of care.

## 2017-02-02 NOTE — ED Notes (Signed)
ED Provider at bedside. 

## 2017-02-02 NOTE — ED Notes (Signed)
Meal tray brought to pt.

## 2017-02-02 NOTE — ED Provider Notes (Signed)
[facility] DEPT Provider Note   CSN: [id number] Arrival date & time: 02/02/17  1523     History   Chief Complaint Chief Complaint  Patient presents with  . Abscess    HPI [name] is a 83 y.o. female.  The history is provided by the patient.  Abscess  Abscess location: upper back. Size:  2 x 2 cm Abscess quality: fluctuance, induration, painful, redness and warmth   Duration:  1 week Progression:  Worsening Pain details:    Quality:  Aching   Severity:  Moderate   Duration:  1 week   Timing:  Constant   Progression:  Worsening Chronicity:  New Context: diabetes   Relieved by:  Nothing Worsened by:  Nothing Ineffective treatments:  None tried Associated symptoms: no fatigue, no fever, no nausea and no vomiting    83 year old female who presents with abscess cellulitis to upper back. History of DM, afib on eliquis, diastolic heart failure with chronic respiratory failure on 2L home oxygen. Has had sebacious cyst on back for a while now. [place] one week ago, and since with gradually worsening redness swelling and mild bloody drainage from back. No fever or chills.    Past Medical History:  Diagnosis Date  . Bladder cancer ([place])   . Bowel obstruction ([place]) 08/25/2014  . Breast cancer ([place])   . Cervical cancer ([place])   . Chronic diastolic heart failure ([place])    a. Echo 5/12: Mild LVH, EF 55-60%, mild AI, mild MR, severe LAE, mild RAE, PASP 31, small pericardial effusion  . Colon cancer ([place])    "polyp" (12/08/2012)  . Diastolic CHF, chronic ([place]) 11/11/2012   Class 2b-3 2 d echo 5/12 mild LVH EF 55-50%, MILD AI, MILD MR, SEVERE LAE, mild RAE, PASP 31, SMALL PERICRADIAL EFFUSION  . DM2 (diabetes mellitus, type 2) ([place])    TYPE 2  . H/O ovarian cancer 11/11/2012    MUCINOUS CYST S/P RESECTION 35 YEARS AGO  . HCAP (healthcare-associated pneumonia) 07/19/2015  . HLD (hyperlipidemia)   . Hx of bladder cancer 11/11/2012  . Hx of cervical cancer 11/11/2012  .  Hypertension   . Hypothyroidism   . OSA (obstructive sleep apnea)   . Ovarian cancer ([place])   . Permanent atrial fibrillation ([facility])    a. coumadin d/c'd => Pradaxa in 05/2012  . S/P hysterectomy 11/11/2012  . SBO (small bowel obstruction) ([place])   . SBO (small bowel obstruction) ([place]) 08/25/2014  . Venous insufficiency    chronic LE edema    Patient Active Problem List   Diagnosis Date Noted  . Cellulitis and abscess of trunk 02/02/2017  . Severe obesity (BMI >= 40) ([place]) 09/05/2016  . Chronic venous stasis dermatitis of both lower extremities 09/05/2016  . MRSA carrier 09/05/2016  . Chronic cutaneous venous stasis ulcer ([place]) 08/03/2016  . Goals of care, counseling/discussion   . Diabetes mellitus with complication ([place])   . Bilateral leg edema 07/30/2016  . Acute respiratory failure with hypoxia ([place]) 07/30/2016  . HCAP (healthcare-associated pneumonia) 07/19/2015  . Essential hypertension 03/23/2013  . Nausea & vomiting 11/11/2012  . Hypothyroidism 11/11/2012  . Hx SBO 11/11/2012  . Colon cancer ([place]) 11/11/2012  . Acute on chronic diastolic CHF (congestive heart failure) ([place]) 11/11/2012  . Venous insufficiency 11/11/2012  . Hyperlipidemia 11/11/2012  . OSA (obstructive sleep apnea) 11/11/2012  . HX: breast cancer 11/11/2012  . Hx of cervical cancer 11/11/2012  . H/O ovarian cancer 11/11/2012  . Hx  of bladder cancer 11/11/2012  . S/P hysterectomy 11/11/2012  . SBO (small bowel obstruction) ([place]) 12/06/2011  . Permanent atrial fibrillation ([place]) 02/17/2011    Past Surgical History:  Procedure Laterality Date  . APPENDECTOMY    . BLADDER SURGERY    . BREAST BIOPSY Bilateral   . BREAST LUMPECTOMY Right   . CATARACT EXTRACTION W/ INTRAOCULAR LENS  IMPLANT, BILATERAL    . EXPLORATORY LAPAROTOMY WITH ABDOMINAL MASS EXCISION     "21# ovarian tumor; benign" (12/08/2012)  . I&D EXTREMITY Right 12/31/2012   Procedure: IRRIGATION AND DEBRIDEMENT RIGHT KNEE ULCER WITH PLACEMENT  OF A CELL AND VAC ;  Surgeon: Theodoro Kos, DO;  Location: [facility]S;  Service: Plastics;  Laterality: Right;  . MASTECTOMY, RADICAL Left   . VAGINAL HYSTERECTOMY      OB History    No data available       Home Medications    Prior to Admission medications   Medication Sig Start Date End Date Taking? Authorizing Provider  apixaban (ELIQUIS) 5 MG TABS tablet Take 1 tablet (5 mg total) by mouth 2 (two) times daily. 01/18/14  Yes Allred, Jeneen Rinks, MD  bisacodyl (DULCOLAX) 10 MG suppository Place 10 mg rectally as needed for moderate constipation.   Yes [provider]  bisacodyl (DULCOLAX) 5 MG EC tablet Take 5 mg by mouth daily.    Yes [provider]  clobetasol cream (TEMOVATE) 1.32 % Apply 1 application topically daily as needed (rash).   Yes [provider]  diltiazem (CARDIZEM CD) 120 MG 24 hr capsule Take 3 capsules (360 mg total) by mouth daily. 09/06/16  Yes Rama, Venetia Maxon, MD  ferrous sulfate 325 (65 FE) MG tablet Take 325 mg by mouth daily with breakfast.   Yes [provider]  furosemide (LASIX) 20 MG tablet Take 60 mg by mouth 2 (two) times daily.   Yes [provider]  levothyroxine (SYNTHROID, LEVOTHROID) 137 MCG tablet Take 137 mcg by mouth daily before breakfast.   Yes [provider]  lisinopril (PRINIVIL,ZESTRIL) 5 MG tablet Take 5 mg by mouth daily.   Yes [provider]  potassium chloride SA (K-DUR,KLOR-CON) 20 MEQ tablet Take 1 tablet (20 mEq total) by mouth 2 (two) times daily. 09/06/16  Yes Rama, Venetia Maxon, MD  senna-docusate (SENOKOT-S) 8.6-50 MG per tablet Take 1 tablet by mouth 2 (two) times daily. 08/31/14  Yes Vann, Jessica U, DO  sodium phosphate (FLEET) 7-19 GM/118ML ENEM Place 133 mLs (1 enema total) rectally daily as needed for severe constipation. 08/31/14  Yes Geradine Girt, DO  UNABLE TO FIND Oxygen at 2 liters nasal cannula 09/06/16  Yes Rama, Venetia Maxon, MD  Zinc Oxide (BALMEX EX) Apply topically as  directed.   Yes [provider]  cyanocobalamin (,VITAMIN B-12,) 1000 MCG/ML injection Inject 1,000 mcg into the muscle as directed.    [provider]    Family History Family History  Problem Relation Age of Onset  . Hypertension Other     Social History Social History  Substance Use Topics  . Smoking status: Former Smoker    Packs/day: 3.00    Years: 25.00    Types: Cigarettes    Quit date: 07/02/1966  . Smokeless tobacco: Never Used  . Alcohol use No     Allergies   Morphine and related; Statins; Zetia [ezetimibe]; Atorvastatin; Crestor [rosuvastatin calcium]; Metoprolol succinate [metoprolol]; Morphine; Nsaids; and Codeine   Review of Systems Review of Systems  Constitutional: Negative  for fatigue and fever.  Respiratory: Negative for shortness of breath.   Cardiovascular: Negative for chest pain.  Gastrointestinal: Negative for nausea and vomiting.  All other systems reviewed and are negative.    Physical Exam Updated Vital Signs BP (!) 95/49   Pulse 68   Temp (!) 97.4 F (36.3 C) (Oral)   Resp (!) 21   SpO2 97%   Physical Exam Physical Exam  Nursing note and vitals reviewed. Constitutional: non-toxic, and in no acute distress Head: Normocephalic and atraumatic.  Mouth/Throat: Oropharynx is clear and moist.  Neck: Normal range of motion. Neck supple.  Cardiovascular: Normal rate and regular rhythm.   Pulmonary/Chest: Effort normal and breath sounds normal.  Abdominal: Soft. There is no tenderness. There is no rebound and no guarding.  Musculoskeletal: Normal range of motion.  Neurological: Alert, no facial droop, fluent speech, moves all extremities symmetrically Skin: Skin is warm and dry. Large area of erythema, warmth, and induration to the upper back, tender to palpation. Psychiatric: Cooperative   ED Treatments / Results  Labs (all labs ordered are listed, but only abnormal results are displayed) Labs Reviewed  COMPREHENSIVE  METABOLIC PANEL - Abnormal; Notable for the following:       Result Value   Glucose, Bld 103 (*)    BUN 29 (*)    Albumin 3.3 (*)    ALT 13 (*)    GFR calc non Af Amer 49 (*)    GFR calc Af Amer 57 (*)    All other components within normal limits  CBC WITH DIFFERENTIAL/PLATELET - Abnormal; Notable for the following:    WBC 10.7 (*)    Hemoglobin 11.7 (*)    Neutro Abs 8.2 (*)    Monocytes Absolute 1.1 (*)    All other components within normal limits  I-STAT CG4 LACTIC ACID, ED  I-STAT CG4 LACTIC ACID, ED    EKG  EKG Interpretation None       Radiology No results found.  Procedures .Marland KitchenIncision and Drainage Date/Time: 02/02/2017 7:22 PM Performed by: Brantley Stage DUO Authorized by: Brantley Stage DUO   Consent:    Consent obtained:  Verbal   Consent given by:  Patient   Risks discussed:  Bleeding, incomplete drainage and pain   Alternatives discussed:  No treatment Location:    Type:  Abscess   Size:  2 x 2 cm   Location:  Trunk Pre-procedure details:    Skin preparation:  Betadine Anesthesia (see MAR for exact dosages):    Anesthesia method:  Local infiltration   Local anesthetic:  Lidocaine 2% WITH epi Procedure type:    Complexity:  Simple Procedure details:    Needle aspiration: no     Incision types:  Single straight   Incision depth:  Submucosal   Scalpel blade:  11   Wound management:  Probed and deloculated   Drainage:  Bloody and purulent   Drainage amount:  Copious   Wound treatment:  Wound left open   Packing materials:  1/2 in gauze Post-procedure details:    Patient tolerance of procedure:  Tolerated well, no immediate complications    (including critical care time) EMERGENCY DEPARTMENT US SOFT TISSUE INTERPRETATION "Study: Limited Soft Tissue Ultrasound"  INDICATIONS: Pain Multiple views of the body part were obtained in real-time with a multi-frequency linear probe  PERFORMED BY: Myself IMAGES ARCHIVED?: No SIDE:Midline BODY PART:Upper  back INTERPRETATION:  Abcess present and Cellulitis present    Medications Ordered in ED Medications  lidocaine-prilocaine (  EMLA) cream ( Topical Given 02/02/17 1731)  lidocaine-EPINEPHrine (XYLOCAINE W/EPI) 2 %-1:200000 (PF) injection 20 mL (20 mLs Intradermal Given 02/02/17 1731)  fentaNYL (SUBLIMAZE) injection 100 mcg (100 mcg Intravenous Given 02/02/17 1727)  vancomycin (VANCOCIN) IVPB 1000 mg/200 mL premix (1,000 mg Intravenous New Bag/Given 02/02/17 1855)     Initial Impression / Assessment and Plan / ED Course  I have reviewed the triage vital signs and the nursing notes.  Pertinent labs & imaging results that were available during my care of the patient were reviewed by me and considered in my medical decision making (see chart for details).     Patient on Eliquis in history of diabetes. Presents with abscess and cellulitis of the upper back. Afebrile and hemodynamically stable. Blood work with mild leukocytosis 10.7 but normal lactate.  I&D is performed at bedside with copious amount of bloody and purulent drainage. Her daughter is concerned that patient at this time is staying by herself, does not have home health nurses to come in visit her to monitor for bleeding or worsening infection. Discussed with Dr. Roel Cluck who admit for observation. Started on vancomycin.  Final Clinical Impressions(s) / ED Diagnoses   Final diagnoses:  Cellulitis and abscess of trunk    New Prescriptions New Prescriptions   No medications on file     Forde Dandy, MD 02/02/17 2004

## 2017-02-03 DIAGNOSIS — L723 Sebaceous cyst: Secondary | ICD-10-CM

## 2017-02-03 DIAGNOSIS — Z8249 Family history of ischemic heart disease and other diseases of the circulatory system: Secondary | ICD-10-CM | POA: Diagnosis not present

## 2017-02-03 DIAGNOSIS — I10 Essential (primary) hypertension: Secondary | ICD-10-CM | POA: Diagnosis not present

## 2017-02-03 DIAGNOSIS — L03312 Cellulitis of back [any part except buttock]: Secondary | ICD-10-CM | POA: Diagnosis not present

## 2017-02-03 DIAGNOSIS — L03319 Cellulitis of trunk, unspecified: Secondary | ICD-10-CM | POA: Diagnosis not present

## 2017-02-03 DIAGNOSIS — I5032 Chronic diastolic (congestive) heart failure: Secondary | ICD-10-CM | POA: Diagnosis not present

## 2017-02-03 DIAGNOSIS — Z8543 Personal history of malignant neoplasm of ovary: Secondary | ICD-10-CM | POA: Diagnosis not present

## 2017-02-03 DIAGNOSIS — I110 Hypertensive heart disease with heart failure: Secondary | ICD-10-CM | POA: Diagnosis not present

## 2017-02-03 DIAGNOSIS — Z8541 Personal history of malignant neoplasm of cervix uteri: Secondary | ICD-10-CM | POA: Diagnosis not present

## 2017-02-03 DIAGNOSIS — Z85038 Personal history of other malignant neoplasm of large intestine: Secondary | ICD-10-CM | POA: Diagnosis not present

## 2017-02-03 DIAGNOSIS — L089 Local infection of the skin and subcutaneous tissue, unspecified: Secondary | ICD-10-CM

## 2017-02-03 DIAGNOSIS — G4733 Obstructive sleep apnea (adult) (pediatric): Secondary | ICD-10-CM | POA: Diagnosis not present

## 2017-02-03 DIAGNOSIS — E118 Type 2 diabetes mellitus with unspecified complications: Secondary | ICD-10-CM

## 2017-02-03 DIAGNOSIS — L02212 Cutaneous abscess of back [any part, except buttock]: Secondary | ICD-10-CM | POA: Diagnosis not present

## 2017-02-03 DIAGNOSIS — E7800 Pure hypercholesterolemia, unspecified: Secondary | ICD-10-CM

## 2017-02-03 DIAGNOSIS — Z853 Personal history of malignant neoplasm of breast: Secondary | ICD-10-CM | POA: Diagnosis not present

## 2017-02-03 DIAGNOSIS — E119 Type 2 diabetes mellitus without complications: Secondary | ICD-10-CM | POA: Diagnosis not present

## 2017-02-03 LAB — COMPREHENSIVE METABOLIC PANEL
ALBUMIN: 2.6 g/dL — AB (ref 3.5–5.0)
ALK PHOS: 56 U/L (ref 38–126)
ALT: 11 U/L — AB (ref 14–54)
ANION GAP: 9 (ref 5–15)
AST: 15 U/L (ref 15–41)
BUN: 30 mg/dL — ABNORMAL HIGH (ref 6–20)
CALCIUM: 8.4 mg/dL — AB (ref 8.9–10.3)
CO2: 26 mmol/L (ref 22–32)
Chloride: 102 mmol/L (ref 101–111)
Creatinine, Ser: 0.88 mg/dL (ref 0.44–1.00)
GFR calc Af Amer: >60 mL/min (ref >60)
GFR calc non Af Amer: 54 mL/min — ABNORMAL LOW (ref >60)
GLUCOSE: 106 mg/dL — AB (ref 65–99)
Potassium: 3.8 mmol/L (ref 3.5–5.1)
SODIUM: 137 mmol/L (ref 135–145)
Total Bilirubin: 0.6 mg/dL (ref 0.3–1.2)
Total Protein: 5.2 g/dL — ABNORMAL LOW (ref 6.5–8.1)

## 2017-02-03 LAB — GLUCOSE, CAPILLARY
GLUCOSE-CAPILLARY: 89 mg/dL (ref 65–99)
Glucose-Capillary: 96 mg/dL (ref 65–99)

## 2017-02-03 LAB — MRSA PCR SCREENING: MRSA by PCR: NEGATIVE

## 2017-02-03 LAB — PHOSPHORUS: Phosphorus: 4.2 mg/dL (ref 2.5–4.6)

## 2017-02-03 LAB — CBC
HCT: 34.3 % — ABNORMAL LOW (ref 36.0–46.0)
HEMOGLOBIN: 10.8 g/dL — AB (ref 12.0–15.0)
MCH: 28.9 pg (ref 26.0–34.0)
MCHC: 31.5 g/dL (ref 30.0–36.0)
MCV: 91.7 fL (ref 78.0–100.0)
Platelets: 156 10*3/uL (ref 150–400)
RBC: 3.74 MIL/uL — ABNORMAL LOW (ref 3.87–5.11)
RDW: 14.9 % (ref 11.5–15.5)
WBC: 7.1 10*3/uL (ref 4.0–10.5)

## 2017-02-03 LAB — MAGNESIUM: MAGNESIUM: 2.3 mg/dL (ref 1.7–2.4)

## 2017-02-03 LAB — TSH: TSH: 13.294 u[IU]/mL — ABNORMAL HIGH (ref 0.350–4.500)

## 2017-02-03 MED ORDER — DOXYCYCLINE HYCLATE 100 MG PO TABS
100.0000 mg | ORAL_TABLET | Freq: Two times a day (BID) | ORAL | Status: DC
  Administered 2017-02-03: 100 mg via ORAL
  Filled 2017-02-03: qty 1

## 2017-02-03 MED ORDER — DOXYCYCLINE HYCLATE 100 MG PO TABS: 100.0000 mg | ORAL_TABLET | Freq: Two times a day (BID) | ORAL | 0 refills | Status: AC

## 2017-02-03 NOTE — Care Management Note (Signed)
Case Management Note  Patient Details  Name: [name] MRN: [id number] Date of Birth: January 26, 1920  Subjective/Objective: 83 y.o. Resident of [facility] [address], already active with [facility]. Ronalee Belts, the rep for that agency is aware of discharge and will coordinate resumption of services.                    Action/Plan:CM will sign off for now but will be available should additional discharge needs arise or disposition change.    Expected Discharge Date:  02/03/17               Expected Discharge Plan:  [place]  In-House Referral:  NA  Discharge planning Services  CM Consult  Post Acute Care Choice:  Home Health Choice offered to:   (active with [facility])  DME Arranged:  N/A DME Agency:  NA  HH Arranged:  RN, Social Work [place] Agency:   ([facility])  Status of Service:  Completed, signed off  If discussed at [employer] [facility] [employer], dates discussed:    Additional Comments:  Delrae Sawyers, RN 02/03/2017, 4:37 PM

## 2017-02-03 NOTE — Discharge Summary (Signed)
Physician Discharge Summary  [name] LOV:[id number] DOB: 1919-10-25 DOA: 02/02/2017  PCP: Hulan Fess, MD  Admit date: 02/02/2017 Discharge date: 02/03/2017  Admitted From: ALF Disposition: ALF   Recommendations for Outpatient Follow-up:  1. Follow up with PCP in 1 weeks 2. Please repeat TSH on outpatient follow up 2-4 weeks 3. Please remove packing from wound in 24 hours  Home Health: YES, RN for wound care, remove packing in 24 hours  Discharge Condition: STABLE   CODE STATUS: FULL   Brief Hospitalization Summary: Please see all hospital notes, images, labs for full details of the hospitalization.  [name] is a 83 y.o. female with medical history significant of bladder cancer, breast cancer, cervical cancer, chronic diastolic CHF, DM 2, HLD, HTN,  OSA, atrial fibrillation on chronic anticoagulation presented with one-week history of swelling and redness and pain of her back she had a sebaceous cyst there that has been aggravated by a fall. She states it caused her to have it burst. Since then she have had some drainage and redness and warmth she was sent to [facility] ER from assisted living for IV antibiotics.  In ED patient undergone I&D of a lesion with copious some moderate purulent drainage wound was packed and patient was started on vancomycin with plan to admit for father IV antibiotics.  She denies any fevers or chills chest pain shortness of breath at home.  Regarding pertinent Chronic problems: Regarding diastolic CHF last echo gram 2016 showed mild LVH preserved EF by cardiology. Maintained on Lasix  A.fib on eliquis and diltiazem last admission was from January 8 26 of February 2018 she required diuresis during that time was noted to be bradycardic down to 30s 40s and her metoprolol was discontinued that she continue on Cardizem. Patient has history of noncompliance refuses to wear her oxygen prescribed oxygen sometimes runs down and 80s IN ER:  Temp (24hrs),  Avg:97.4 F (36.3 C), Min:97.4 F (36.3 C), Max:97.4 F (36.3 C)  Pt had an infected sebaceous cyst on the back that was incised and drained in ED. It had some surrounding cellulitis.  She had a mild bump in WBC.  She was admitted for observation and given IV vancomycin and the WBC came down to normal and she noticed a big improvement.  She will be discharged home with a home health nurse for wound care.  She lives in an ALF and will be going back there.   I spoke with her daughter and she says that patient has difficulties with compliance with medical recommendations and struggles to follow the recs.  A home health nurse will be a good idea to help her see this wound to healing.  I warned her that there is a high probability that this cyst will reform and come back.  She verbalized understanding.  She will be discharged on 7 days of oral doxycycline.    Also of note, her TSH was checked and it came back elevated at 13. Given she is having this acute illness, I thought it best to have her TSH rechecked with her PCP after she is recovered from the acute illness before making major medication adjustments.  Given her history of noncompliance I wonder if she is taking her thyroid replacement medication as prescribed.  I did not adjust her dose of thyroid medication and will defer to her PCP on follow up.    Discharge Diagnoses:  Active Problems:   Permanent atrial fibrillation (HCC)   Hypothyroidism  Hyperlipidemia   OSA (obstructive sleep apnea)   Essential hypertension   Diabetes mellitus with complication (HCC)   MRSA carrier   Cellulitis and abscess of trunk   Cellulitis   Pressure ulcer    Discharge Instructions: Discharge Instructions    Call MD for:  difficulty breathing, headache or visual disturbances    Complete by:  As directed    Call MD for:  extreme fatigue    Complete by:  As directed    Call MD for:  persistant dizziness or light-headedness    Complete by:  As directed     Call MD for:  persistant nausea and vomiting    Complete by:  As directed    Call MD for:  redness, tenderness, or signs of infection (pain, swelling, redness, odor or green/yellow discharge around incision site)    Complete by:  As directed    Call MD for:  severe uncontrolled pain    Complete by:  As directed    Call MD for:  temperature >100.4    Complete by:  As directed      Allergies as of 02/03/2017      Reactions   Morphine And Related Other (See Comments)   sick   Statins Other (See Comments)   sick   Zetia [ezetimibe] Other (See Comments)   Side effect too strong    Atorvastatin Nausea Only   Leg swelling   Crestor [rosuvastatin Calcium]    Myalgia    Metoprolol Succinate [metoprolol]    Bradycardia   Morphine    Vomiting (intolerance)   Nsaids    unknown   Codeine Nausea And Vomiting      Medication List    TAKE these medications   apixaban 5 MG Tabs tablet Commonly known as:  ELIQUIS Take 1 tablet (5 mg total) by mouth 2 (two) times daily.   BALMEX EX Apply topically as directed.   bisacodyl 10 MG suppository Commonly known as:  DULCOLAX Place 10 mg rectally as needed for moderate constipation.   bisacodyl 5 MG EC tablet Commonly known as:  DULCOLAX Take 5 mg by mouth daily.   clobetasol cream 0.05 % Commonly known as:  TEMOVATE Apply 1 application topically daily as needed (rash).   cyanocobalamin 1000 MCG/ML injection Commonly known as:  (VITAMIN B-12) Inject 1,000 mcg into the muscle as directed.   diltiazem 120 MG 24 hr capsule Commonly known as:  CARDIZEM CD Take 3 capsules (360 mg total) by mouth daily.   doxycycline 100 MG tablet Commonly known as:  VIBRA-TABS Take 1 tablet (100 mg total) by mouth every 12 (twelve) hours.   ferrous sulfate 325 (65 FE) MG tablet Take 325 mg by mouth daily with breakfast.   furosemide 20 MG tablet Commonly known as:  LASIX Take 60 mg by mouth 2 (two) times daily.   levothyroxine 137 MCG  tablet Commonly known as:  SYNTHROID, LEVOTHROID Take 137 mcg by mouth daily before breakfast.   lisinopril 5 MG tablet Commonly known as:  PRINIVIL,ZESTRIL Take 5 mg by mouth daily.   potassium chloride SA 20 MEQ tablet Commonly known as:  K-DUR,KLOR-CON Take 1 tablet (20 mEq total) by mouth 2 (two) times daily.   senna-docusate 8.6-50 MG tablet Commonly known as:  Senokot-S Take 1 tablet by mouth 2 (two) times daily.   sodium phosphate 7-19 GM/118ML Enem Place 133 mLs (1 enema total) rectally daily as needed for severe constipation.   UNABLE TO FIND Oxygen at 2  liters nasal cannula      Follow-up Information    Little, Lennette Bihari, MD. Schedule an appointment as soon as possible for a visit in 5 day(s).   Specialty:  Family Medicine Why:  Hospital Follow Up  Contact information: [place] [address] [phone]          Allergies  Allergen Reactions  . Morphine And Related Other (See Comments)    sick  . Statins Other (See Comments)    sick  . Zetia [Ezetimibe] Other (See Comments)    Side effect too strong   . Atorvastatin Nausea Only    Leg swelling  . Crestor [Rosuvastatin Calcium]     Myalgia   . Metoprolol Succinate [Metoprolol]     Bradycardia  . Morphine     Vomiting (intolerance)  . Nsaids     unknown  . Codeine Nausea And Vomiting   Current Discharge Medication List    START taking these medications   Details  doxycycline (VIBRA-TABS) 100 MG tablet Take 1 tablet (100 mg total) by mouth every 12 (twelve) hours. Qty: 14 tablet, Refills: 0      CONTINUE these medications which have NOT CHANGED   Details  apixaban (ELIQUIS) 5 MG TABS tablet Take 1 tablet (5 mg total) by mouth 2 (two) times daily. Qty: 180 tablet, Refills: 3   Associated Diagnoses: Atrial fibrillation, unspecified    bisacodyl (DULCOLAX) 10 MG suppository Place 10 mg rectally as needed for moderate constipation.    bisacodyl (DULCOLAX) 5 MG EC tablet Take  5 mg by mouth daily.     clobetasol cream (TEMOVATE) 3.57 % Apply 1 application topically daily as needed (rash).    diltiazem (CARDIZEM CD) 120 MG 24 hr capsule Take 3 capsules (360 mg total) by mouth daily.    ferrous sulfate 325 (65 FE) MG tablet Take 325 mg by mouth daily with breakfast.    furosemide (LASIX) 20 MG tablet Take 60 mg by mouth 2 (two) times daily.    levothyroxine (SYNTHROID, LEVOTHROID) 137 MCG tablet Take 137 mcg by mouth daily before breakfast.    lisinopril (PRINIVIL,ZESTRIL) 5 MG tablet Take 5 mg by mouth daily.    potassium chloride SA (K-DUR,KLOR-CON) 20 MEQ tablet Take 1 tablet (20 mEq total) by mouth 2 (two) times daily.    senna-docusate (SENOKOT-S) 8.6-50 MG per tablet Take 1 tablet by mouth 2 (two) times daily.    sodium phosphate (FLEET) 7-19 GM/118ML ENEM Place 133 mLs (1 enema total) rectally daily as needed for severe constipation. Refills: 0    UNABLE TO FIND Oxygen at 2 liters nasal cannula    Zinc Oxide (BALMEX EX) Apply topically as directed.   Associated Diagnoses: Chronic diastolic CHF (congestive heart failure) ([place]); Permanent atrial fibrillation ([place]); Acute on chronic congestive heart failure with left ventricular diastolic dysfunction ([place]); Essential hypertension    cyanocobalamin (,VITAMIN B-12,) 1000 MCG/ML injection Inject 1,000 mcg into the muscle as directed.   Associated Diagnoses: Chronic diastolic CHF (congestive heart failure) ([place]); Permanent atrial fibrillation ([place]); Acute on chronic congestive heart failure with left ventricular diastolic dysfunction ([place]); Essential hypertension        Procedures/Studies:  No results found.   Subjective: Pt says the cyst feels a lot better now that it is drained, some itching at times, no fever or chills.    Discharge Exam: Vitals:   02/03/17 0443 02/03/17 1100  BP: (!) 90/50 (!) 86/62  Pulse: 97   Resp: 10  Temp: 98.2 F (36.8 C) 98.3 F (36.8 C)   Vitals:   02/02/17 2100  02/02/17 2254 02/03/17 0443 02/03/17 1100  BP: 99/74 (!) 96/55 (!) 90/50 (!) 86/62  Pulse: 78 61 97   Resp: (!) 22 19 10    Temp:  97.6 F (36.4 C) 98.2 F (36.8 C) 98.3 F (36.8 C)  TempSrc:  Oral Oral Oral  SpO2: 100% 100% 100% 100%   General: Pt is alert, awake, not in acute distress Skin: wound clean and dry, packing in place, no signs of cellulitis.  Cardiovascular:  S1/S2 +, no rubs, no gallops Respiratory: CTA bilaterally, no wheezing, no rhonchi Abdominal: Soft, NT, ND, bowel sounds + Extremities: bilateral pitting edema LEs   The results of significant diagnostics from this hospitalization (including imaging, microbiology, ancillary and laboratory) are listed below for reference.     Microbiology: Recent Results (from the past 240 hour(s))  MRSA PCR Screening     Status: None   Collection Time: 02/02/17 11:09 PM  Result Value Ref Range Status   MRSA by PCR NEGATIVE NEGATIVE Final    Comment:        The GeneXpert MRSA Assay (FDA approved for NASAL specimens only), is one component of a comprehensive MRSA colonization surveillance program. It is not intended to diagnose MRSA infection nor to guide or monitor treatment for MRSA infections.      Labs: BNP (last 3 results)  Recent Labs  07/30/16 1512 09/05/16 0020  BNP 184.2* 478.2*   Basic Metabolic Panel:  Recent Labs Lab 02/02/17 1537 02/03/17 0547  NA 138 137  K 4.1 3.8  CL 104 102  CO2 24 26  GLUCOSE 103* 106*  BUN 29* 30*  CREATININE 0.95 0.88  CALCIUM 8.9 8.4*  MG  --  2.3  PHOS  --  4.2   Liver Function Tests:  Recent Labs Lab 02/02/17 1537 02/03/17 0547  AST 18 15  ALT 13* 11*  ALKPHOS 69 56  BILITOT 1.0 0.6  PROT 6.5 5.2*  ALBUMIN 3.3* 2.6*   No results for input(s): LIPASE, AMYLASE in the last 168 hours. No results for input(s): AMMONIA in the last 168 hours. CBC:  Recent Labs Lab 02/02/17 1537 02/03/17 0547  WBC 10.7* 7.1  NEUTROABS 8.2*  --   HGB 11.7* 10.8*   HCT 36.0 34.3*  MCV 92.3 91.7  PLT 174 156   Cardiac Enzymes: No results for input(s): CKTOTAL, CKMB, CKMBINDEX, TROPONINI in the last 168 hours. BNP: Invalid input(s): POCBNP CBG:  Recent Labs Lab 02/02/17 2300 02/03/17 0834  GLUCAP 128* 89   D-Dimer No results for input(s): DDIMER in the last 72 hours. Hgb A1c No results for input(s): HGBA1C in the last 72 hours. Lipid Profile No results for input(s): CHOL, HDL, LDLCALC, TRIG, CHOLHDL, LDLDIRECT in the last 72 hours. Thyroid function studies  Recent Labs  02/03/17 0547  TSH 13.294*   Anemia work up No results for input(s): VITAMINB12, FOLATE, FERRITIN, TIBC, IRON, RETICCTPCT in the last 72 hours. Urinalysis    Component Value Date/Time   COLORURINE YELLOW 09/06/2016 [place] 09/06/2016 0851   LABSPEC 1.013 09/06/2016 0851   PHURINE 5.0 09/06/2016 0851   GLUCOSEU NEGATIVE 09/06/2016 0851   HGBUR NEGATIVE 09/06/2016 [place] 09/06/2016 [place] 09/06/2016 0851   PROTEINUR NEGATIVE 09/06/2016 0851   UROBILINOGEN 0.2 08/25/2014 1246   NITRITE NEGATIVE 09/06/2016 0851   LEUKOCYTESUR NEGATIVE 09/06/2016 0851   Sepsis  Labs Invalid input(s): PROCALCITONIN,  WBC,  LACTICIDVEN Microbiology Recent Results (from the past 240 hour(s))  MRSA PCR Screening     Status: None   Collection Time: 02/02/17 11:09 PM  Result Value Ref Range Status   MRSA by PCR NEGATIVE NEGATIVE Final    Comment:        The GeneXpert MRSA Assay (FDA approved for NASAL specimens only), is one component of a comprehensive MRSA colonization surveillance program. It is not intended to diagnose MRSA infection nor to guide or monitor treatment for MRSA infections.    Time coordinating discharge:   SIGNED:  Irwin Brakeman, MD  [facility] 02/03/2017, 1:24 PM Pager [phone]  If 7PM-7AM, please contact night-coverage [email or web address] Password [email or web address]

## 2017-02-03 NOTE — Clinical Social Work Note (Signed)
Pt is from [facility]. CSW spoke with facility and pt can return. CSW sent ALF d/c summary. Pt's daughter will transport pt to ALF.   [place], [place]

## 2017-02-03 NOTE — NC FL2 (Signed)
[place] MEDICAID FL2 LEVEL OF CARE SCREENING TOOL     IDENTIFICATION  Patient Name: [name] Birthdate: October 27, 1919 Sex: female Admission Date (Current Location): 02/02/2017  [facility] and [place] Number:  Herbalist and Address:  The [place], [place] [address], [place], [place] [address]      Provider Number: [id number]  Attending Physician Name and Address:  Murlean Iba, MD  Relative Name and Phone Number:       Current Level of Care: Hospital Recommended Level of Care: [place] Prior Approval Number:    Date Approved/Denied:   PASRR Number:    Discharge Plan:  (ALF)    Current Diagnoses: Patient Active Problem List   Diagnosis Date Noted  . Cellulitis and abscess of trunk 02/02/2017  . Cellulitis 02/02/2017  . Pressure ulcer 02/02/2017  . Severe obesity (BMI >= 40) ([place]) 09/05/2016  . Chronic venous stasis dermatitis of both lower extremities 09/05/2016  . MRSA carrier 09/05/2016  . Chronic cutaneous venous stasis ulcer ([place]) 08/03/2016  . Goals of care, counseling/discussion   . Diabetes mellitus with complication ([place])   . Bilateral leg edema 07/30/2016  . Acute respiratory failure with hypoxia ([place]) 07/30/2016  . HCAP (healthcare-associated pneumonia) 07/19/2015  . Essential hypertension 03/23/2013  . Nausea & vomiting 11/11/2012  . Hypothyroidism 11/11/2012  . Hx SBO 11/11/2012  . Colon cancer ([place]) 11/11/2012  . Acute on chronic diastolic CHF (congestive heart failure) ([place]) 11/11/2012  . Venous insufficiency 11/11/2012  . Hyperlipidemia 11/11/2012  . OSA (obstructive sleep apnea) 11/11/2012  . HX: breast cancer 11/11/2012  . Hx of cervical cancer 11/11/2012  . H/O ovarian cancer 11/11/2012  . Hx of bladder cancer 11/11/2012  . S/P hysterectomy 11/11/2012  . SBO (small bowel obstruction) ([place]) 12/06/2011  . Permanent atrial fibrillation ([place]) 02/17/2011    Orientation RESPIRATION BLADDER  Height & Weight     Self, Place, Situation  Normal Continent Weight:   Height:     BEHAVIORAL SYMPTOMS/MOOD NEUROLOGICAL BOWEL NUTRITION STATUS      Continent  (Please see d/c summary)  AMBULATORY STATUS COMMUNICATION OF NEEDS Skin   Limited Assist Verbally PU Stage and Appropriate Care PU Stage 1 Dressing:  (Sacrum and buttocks, Foam dressing)                     Personal Care Assistance Level of Assistance  Bathing, Feeding, Dressing Bathing Assistance: Limited assistance Feeding assistance: Independent Dressing Assistance: Limited assistance     Functional Limitations Info  Sight, Hearing, Speech Sight Info: Adequate Hearing Info: Adequate Speech Info: Adequate    SPECIAL CARE FACTORS FREQUENCY  PT (By licensed PT), OT (By licensed OT)     PT Frequency: 3x OT Frequency: 3x            Contractures Contractures Info: Not present    Additional Factors Info  Code Status, Allergies, Isolation Precautions Code Status Info: Full Code Allergies Info: Morphine And Related, Statins, Zetia Ezetimibe, Atorvastatin, Crestor Rosuvastatin Calcium, Metoprolol Succinate Metoprolol, Morphine, Nsaids, Codeine     Isolation Precautions Info: MRSA     Current Medications (02/03/2017):  This is the current hospital active medication list Current Facility-Administered Medications  Medication Dose Route Frequency Provider Last Rate Last Dose  . 0.9 %  sodium chloride infusion  250 mL Intravenous PRN Doutova, Anastassia, MD      . acetaminophen (TYLENOL) tablet 650 mg  650 mg Oral Q6H PRN Doutova, Anastassia,  MD       Or  . acetaminophen (TYLENOL) suppository 650 mg  650 mg Rectal Q6H PRN Doutova, Anastassia, MD      . apixaban (ELIQUIS) tablet 5 mg  5 mg Oral BID Toy Baker, MD   5 mg at 02/03/17 1044  . bisacodyl (DULCOLAX) EC tablet 5 mg  5 mg Oral Daily Doutova, Anastassia, MD   5 mg at 02/03/17 1044  . bisacodyl (DULCOLAX) suppository 10 mg  10 mg Rectal PRN Toy Baker, MD      . diltiazem (CARDIZEM CD) 24 hr capsule 360 mg  360 mg Oral Daily Doutova, Anastassia, MD      . doxycycline (VIBRA-TABS) tablet 100 mg  100 mg Oral Q12H Johnson, Clanford L, MD      . furosemide (LASIX) tablet 60 mg  60 mg Oral BID Toy Baker, MD   60 mg at 02/03/17 0801  . HYDROcodone-acetaminophen (NORCO/VICODIN) 5-325 MG per tablet 1-2 tablet  1-2 tablet Oral Q4H PRN Doutova, Anastassia, MD      . insulin aspart (novoLOG) injection 0-15 Units  0-15 Units Subcutaneous TID WC Doutova, Anastassia, MD      . insulin aspart (novoLOG) injection 0-5 Units  0-5 Units Subcutaneous QHS Doutova, Anastassia, MD      . levothyroxine (SYNTHROID, LEVOTHROID) tablet 137 mcg  137 mcg Oral QAC breakfast Toy Baker, MD   137 mcg at 02/03/17 0801  . ondansetron (ZOFRAN) tablet 4 mg  4 mg Oral Q6H PRN Toy Baker, MD       Or  . ondansetron (ZOFRAN) injection 4 mg  4 mg Intravenous Q6H PRN Doutova, Anastassia, MD      . potassium chloride SA (K-DUR,KLOR-CON) CR tablet 20 mEq  20 mEq Oral BID Toy Baker, MD   20 mEq at 02/03/17 1044  . senna-docusate (Senokot-S) tablet 1 tablet  1 tablet Oral BID Toy Baker, MD   1 tablet at 02/03/17 1044  . sodium chloride flush (NS) 0.9 % injection 3 mL  3 mL Intravenous Q12H Doutova, Anastassia, MD   3 mL at 02/03/17 0005  . sodium chloride flush (NS) 0.9 % injection 3 mL  3 mL Intravenous PRN Doutova, Anastassia, MD      . sodium phosphate (FLEET) 7-19 GM/118ML enema 1 enema  1 enema Rectal Daily PRN Toy Baker, MD         Discharge Medications: Please see discharge summary for a list of discharge medications.  Relevant Imaging Results:  Relevant Lab Results:   Additional Information SSN: [id number]  Eileen Stanford, LCSW

## 2017-02-04 LAB — HEMOGLOBIN A1C
HEMOGLOBIN A1C: 5.3 % (ref 4.8–5.6)
Mean Plasma Glucose: 105 mg/dL

## 2017-02-05 DIAGNOSIS — M25551 Pain in right hip: Secondary | ICD-10-CM | POA: Diagnosis not present

## 2017-02-05 DIAGNOSIS — E119 Type 2 diabetes mellitus without complications: Secondary | ICD-10-CM | POA: Diagnosis not present

## 2017-02-05 DIAGNOSIS — M79604 Pain in right leg: Secondary | ICD-10-CM | POA: Diagnosis not present

## 2017-02-05 DIAGNOSIS — I5042 Chronic combined systolic (congestive) and diastolic (congestive) heart failure: Secondary | ICD-10-CM | POA: Diagnosis not present

## 2017-02-05 DIAGNOSIS — I110 Hypertensive heart disease with heart failure: Secondary | ICD-10-CM | POA: Diagnosis not present

## 2017-02-05 DIAGNOSIS — I482 Chronic atrial fibrillation: Secondary | ICD-10-CM | POA: Diagnosis not present

## 2017-02-07 ENCOUNTER — Telehealth: Payer: Self-pay | Admitting: Nurse Practitioner

## 2017-02-07 DIAGNOSIS — E039 Hypothyroidism, unspecified: Secondary | ICD-10-CM | POA: Diagnosis not present

## 2017-02-07 DIAGNOSIS — R296 Repeated falls: Secondary | ICD-10-CM | POA: Diagnosis not present

## 2017-02-07 DIAGNOSIS — L723 Sebaceous cyst: Secondary | ICD-10-CM | POA: Diagnosis not present

## 2017-02-07 DIAGNOSIS — I482 Chronic atrial fibrillation: Secondary | ICD-10-CM | POA: Diagnosis not present

## 2017-02-07 DIAGNOSIS — E119 Type 2 diabetes mellitus without complications: Secondary | ICD-10-CM | POA: Diagnosis not present

## 2017-02-07 DIAGNOSIS — M25551 Pain in right hip: Secondary | ICD-10-CM | POA: Diagnosis not present

## 2017-02-07 DIAGNOSIS — E118 Type 2 diabetes mellitus with unspecified complications: Secondary | ICD-10-CM | POA: Diagnosis not present

## 2017-02-07 DIAGNOSIS — I5042 Chronic combined systolic (congestive) and diastolic (congestive) heart failure: Secondary | ICD-10-CM | POA: Diagnosis not present

## 2017-02-07 DIAGNOSIS — I110 Hypertensive heart disease with heart failure: Secondary | ICD-10-CM | POA: Diagnosis not present

## 2017-02-07 DIAGNOSIS — I509 Heart failure, unspecified: Secondary | ICD-10-CM | POA: Diagnosis not present

## 2017-02-07 DIAGNOSIS — M79604 Pain in right leg: Secondary | ICD-10-CM | POA: Diagnosis not present

## 2017-02-07 NOTE — Telephone Encounter (Signed)
Patient has had 3 falls over the past month.  Saw Dr Little(PCP) today and he suggested stopping Eliquis due to this.  They have also called in Hospice.  [name], patient's daughter, says they understand the risk with afib and stroke but are worried about her falling and hitting her head.

## 2017-02-07 NOTE — Telephone Encounter (Signed)
Patient daughter [name]) calling and requests a call back, would not go into detail.

## 2017-02-08 DIAGNOSIS — I110 Hypertensive heart disease with heart failure: Secondary | ICD-10-CM | POA: Diagnosis not present

## 2017-02-08 DIAGNOSIS — M25551 Pain in right hip: Secondary | ICD-10-CM | POA: Diagnosis not present

## 2017-02-08 DIAGNOSIS — I482 Chronic atrial fibrillation: Secondary | ICD-10-CM | POA: Diagnosis not present

## 2017-02-08 DIAGNOSIS — M79604 Pain in right leg: Secondary | ICD-10-CM | POA: Diagnosis not present

## 2017-02-08 DIAGNOSIS — E119 Type 2 diabetes mellitus without complications: Secondary | ICD-10-CM | POA: Diagnosis not present

## 2017-02-08 DIAGNOSIS — I5042 Chronic combined systolic (congestive) and diastolic (congestive) heart failure: Secondary | ICD-10-CM | POA: Diagnosis not present

## 2017-02-08 NOTE — Telephone Encounter (Signed)
Discussed with Dr Rayann Heman and he said that it is okay stop at this point understanding the risk and try to keep her comfortable.  As Dr Rex Kras saw her and made this recommendation he would like for him to handle.  I have spoken to her daughter and she understands the risk associated with stopping and says that Dr Rex Kras has also placed a Hospice referral.  She was going to call Dr Eddie Dibbles office and discuss.

## 2017-02-09 DIAGNOSIS — M79604 Pain in right leg: Secondary | ICD-10-CM | POA: Diagnosis not present

## 2017-02-09 DIAGNOSIS — I110 Hypertensive heart disease with heart failure: Secondary | ICD-10-CM | POA: Diagnosis not present

## 2017-02-09 DIAGNOSIS — M25551 Pain in right hip: Secondary | ICD-10-CM | POA: Diagnosis not present

## 2017-02-09 DIAGNOSIS — I5042 Chronic combined systolic (congestive) and diastolic (congestive) heart failure: Secondary | ICD-10-CM | POA: Diagnosis not present

## 2017-02-09 DIAGNOSIS — E119 Type 2 diabetes mellitus without complications: Secondary | ICD-10-CM | POA: Diagnosis not present

## 2017-02-09 DIAGNOSIS — I482 Chronic atrial fibrillation: Secondary | ICD-10-CM | POA: Diagnosis not present

## 2017-02-10 DIAGNOSIS — I482 Chronic atrial fibrillation: Secondary | ICD-10-CM | POA: Diagnosis not present

## 2017-02-10 DIAGNOSIS — M25551 Pain in right hip: Secondary | ICD-10-CM | POA: Diagnosis not present

## 2017-02-10 DIAGNOSIS — M79604 Pain in right leg: Secondary | ICD-10-CM | POA: Diagnosis not present

## 2017-02-10 DIAGNOSIS — I110 Hypertensive heart disease with heart failure: Secondary | ICD-10-CM | POA: Diagnosis not present

## 2017-02-10 DIAGNOSIS — I5042 Chronic combined systolic (congestive) and diastolic (congestive) heart failure: Secondary | ICD-10-CM | POA: Diagnosis not present

## 2017-02-10 DIAGNOSIS — E119 Type 2 diabetes mellitus without complications: Secondary | ICD-10-CM | POA: Diagnosis not present

## 2017-02-11 DIAGNOSIS — E119 Type 2 diabetes mellitus without complications: Secondary | ICD-10-CM | POA: Diagnosis not present

## 2017-02-11 DIAGNOSIS — I482 Chronic atrial fibrillation: Secondary | ICD-10-CM | POA: Diagnosis not present

## 2017-02-11 DIAGNOSIS — I5042 Chronic combined systolic (congestive) and diastolic (congestive) heart failure: Secondary | ICD-10-CM | POA: Diagnosis not present

## 2017-02-11 DIAGNOSIS — I110 Hypertensive heart disease with heart failure: Secondary | ICD-10-CM | POA: Diagnosis not present

## 2017-02-11 DIAGNOSIS — M25551 Pain in right hip: Secondary | ICD-10-CM | POA: Diagnosis not present

## 2017-02-11 DIAGNOSIS — M79604 Pain in right leg: Secondary | ICD-10-CM | POA: Diagnosis not present

## 2017-02-12 DIAGNOSIS — I482 Chronic atrial fibrillation: Secondary | ICD-10-CM | POA: Diagnosis not present

## 2017-02-12 DIAGNOSIS — I5042 Chronic combined systolic (congestive) and diastolic (congestive) heart failure: Secondary | ICD-10-CM | POA: Diagnosis not present

## 2017-02-12 DIAGNOSIS — M25551 Pain in right hip: Secondary | ICD-10-CM | POA: Diagnosis not present

## 2017-02-12 DIAGNOSIS — E119 Type 2 diabetes mellitus without complications: Secondary | ICD-10-CM | POA: Diagnosis not present

## 2017-02-12 DIAGNOSIS — M79604 Pain in right leg: Secondary | ICD-10-CM | POA: Diagnosis not present

## 2017-02-12 DIAGNOSIS — I110 Hypertensive heart disease with heart failure: Secondary | ICD-10-CM | POA: Diagnosis not present

## 2017-02-13 DIAGNOSIS — M79604 Pain in right leg: Secondary | ICD-10-CM | POA: Diagnosis not present

## 2017-02-13 DIAGNOSIS — I110 Hypertensive heart disease with heart failure: Secondary | ICD-10-CM | POA: Diagnosis not present

## 2017-02-13 DIAGNOSIS — I482 Chronic atrial fibrillation: Secondary | ICD-10-CM | POA: Diagnosis not present

## 2017-02-13 DIAGNOSIS — M25551 Pain in right hip: Secondary | ICD-10-CM | POA: Diagnosis not present

## 2017-02-13 DIAGNOSIS — I5042 Chronic combined systolic (congestive) and diastolic (congestive) heart failure: Secondary | ICD-10-CM | POA: Diagnosis not present

## 2017-02-13 DIAGNOSIS — E119 Type 2 diabetes mellitus without complications: Secondary | ICD-10-CM | POA: Diagnosis not present

## 2017-02-14 DIAGNOSIS — I110 Hypertensive heart disease with heart failure: Secondary | ICD-10-CM | POA: Diagnosis not present

## 2017-02-14 DIAGNOSIS — E119 Type 2 diabetes mellitus without complications: Secondary | ICD-10-CM | POA: Diagnosis not present

## 2017-02-14 DIAGNOSIS — I5042 Chronic combined systolic (congestive) and diastolic (congestive) heart failure: Secondary | ICD-10-CM | POA: Diagnosis not present

## 2017-02-14 DIAGNOSIS — M79604 Pain in right leg: Secondary | ICD-10-CM | POA: Diagnosis not present

## 2017-02-14 DIAGNOSIS — M25551 Pain in right hip: Secondary | ICD-10-CM | POA: Diagnosis not present

## 2017-02-14 DIAGNOSIS — I482 Chronic atrial fibrillation: Secondary | ICD-10-CM | POA: Diagnosis not present

## 2017-02-15 DIAGNOSIS — M79604 Pain in right leg: Secondary | ICD-10-CM | POA: Diagnosis not present

## 2017-02-15 DIAGNOSIS — I482 Chronic atrial fibrillation: Secondary | ICD-10-CM | POA: Diagnosis not present

## 2017-02-15 DIAGNOSIS — E119 Type 2 diabetes mellitus without complications: Secondary | ICD-10-CM | POA: Diagnosis not present

## 2017-02-15 DIAGNOSIS — I110 Hypertensive heart disease with heart failure: Secondary | ICD-10-CM | POA: Diagnosis not present

## 2017-02-15 DIAGNOSIS — I5042 Chronic combined systolic (congestive) and diastolic (congestive) heart failure: Secondary | ICD-10-CM | POA: Diagnosis not present

## 2017-02-15 DIAGNOSIS — M25551 Pain in right hip: Secondary | ICD-10-CM | POA: Diagnosis not present

## 2017-02-17 DIAGNOSIS — I482 Chronic atrial fibrillation: Secondary | ICD-10-CM | POA: Diagnosis not present

## 2017-02-17 DIAGNOSIS — M25551 Pain in right hip: Secondary | ICD-10-CM | POA: Diagnosis not present

## 2017-02-17 DIAGNOSIS — I5042 Chronic combined systolic (congestive) and diastolic (congestive) heart failure: Secondary | ICD-10-CM | POA: Diagnosis not present

## 2017-02-17 DIAGNOSIS — E119 Type 2 diabetes mellitus without complications: Secondary | ICD-10-CM | POA: Diagnosis not present

## 2017-02-17 DIAGNOSIS — M79604 Pain in right leg: Secondary | ICD-10-CM | POA: Diagnosis not present

## 2017-02-17 DIAGNOSIS — I110 Hypertensive heart disease with heart failure: Secondary | ICD-10-CM | POA: Diagnosis not present

## 2017-02-18 DIAGNOSIS — M79604 Pain in right leg: Secondary | ICD-10-CM | POA: Diagnosis not present

## 2017-02-18 DIAGNOSIS — E119 Type 2 diabetes mellitus without complications: Secondary | ICD-10-CM | POA: Diagnosis not present

## 2017-02-18 DIAGNOSIS — M25551 Pain in right hip: Secondary | ICD-10-CM | POA: Diagnosis not present

## 2017-02-18 DIAGNOSIS — I5042 Chronic combined systolic (congestive) and diastolic (congestive) heart failure: Secondary | ICD-10-CM | POA: Diagnosis not present

## 2017-02-18 DIAGNOSIS — I110 Hypertensive heart disease with heart failure: Secondary | ICD-10-CM | POA: Diagnosis not present

## 2017-02-18 DIAGNOSIS — I482 Chronic atrial fibrillation: Secondary | ICD-10-CM | POA: Diagnosis not present

## 2017-02-19 DIAGNOSIS — E119 Type 2 diabetes mellitus without complications: Secondary | ICD-10-CM | POA: Diagnosis not present

## 2017-02-19 DIAGNOSIS — I482 Chronic atrial fibrillation: Secondary | ICD-10-CM | POA: Diagnosis not present

## 2017-02-19 DIAGNOSIS — M25551 Pain in right hip: Secondary | ICD-10-CM | POA: Diagnosis not present

## 2017-02-19 DIAGNOSIS — I5042 Chronic combined systolic (congestive) and diastolic (congestive) heart failure: Secondary | ICD-10-CM | POA: Diagnosis not present

## 2017-02-19 DIAGNOSIS — I110 Hypertensive heart disease with heart failure: Secondary | ICD-10-CM | POA: Diagnosis not present

## 2017-02-19 DIAGNOSIS — M79604 Pain in right leg: Secondary | ICD-10-CM | POA: Diagnosis not present

## 2017-02-20 DIAGNOSIS — M79604 Pain in right leg: Secondary | ICD-10-CM | POA: Diagnosis not present

## 2017-02-20 DIAGNOSIS — I482 Chronic atrial fibrillation: Secondary | ICD-10-CM | POA: Diagnosis not present

## 2017-02-20 DIAGNOSIS — M25551 Pain in right hip: Secondary | ICD-10-CM | POA: Diagnosis not present

## 2017-02-20 DIAGNOSIS — E119 Type 2 diabetes mellitus without complications: Secondary | ICD-10-CM | POA: Diagnosis not present

## 2017-02-20 DIAGNOSIS — I110 Hypertensive heart disease with heart failure: Secondary | ICD-10-CM | POA: Diagnosis not present

## 2017-02-20 DIAGNOSIS — I5042 Chronic combined systolic (congestive) and diastolic (congestive) heart failure: Secondary | ICD-10-CM | POA: Diagnosis not present

## 2017-02-21 DIAGNOSIS — I519 Heart disease, unspecified: Secondary | ICD-10-CM | POA: Diagnosis not present

## 2017-02-21 DIAGNOSIS — I5030 Unspecified diastolic (congestive) heart failure: Secondary | ICD-10-CM | POA: Diagnosis not present

## 2017-02-21 DIAGNOSIS — Z853 Personal history of malignant neoplasm of breast: Secondary | ICD-10-CM | POA: Diagnosis not present

## 2017-02-21 DIAGNOSIS — E039 Hypothyroidism, unspecified: Secondary | ICD-10-CM | POA: Diagnosis not present

## 2017-02-21 DIAGNOSIS — I10 Essential (primary) hypertension: Secondary | ICD-10-CM | POA: Diagnosis not present

## 2017-02-21 DIAGNOSIS — I359 Nonrheumatic aortic valve disorder, unspecified: Secondary | ICD-10-CM | POA: Diagnosis not present

## 2017-02-21 DIAGNOSIS — I4891 Unspecified atrial fibrillation: Secondary | ICD-10-CM | POA: Diagnosis not present

## 2017-02-21 DIAGNOSIS — L209 Atopic dermatitis, unspecified: Secondary | ICD-10-CM | POA: Diagnosis not present

## 2017-02-21 DIAGNOSIS — Z85038 Personal history of other malignant neoplasm of large intestine: Secondary | ICD-10-CM | POA: Diagnosis not present

## 2017-02-21 DIAGNOSIS — E1159 Type 2 diabetes mellitus with other circulatory complications: Secondary | ICD-10-CM | POA: Diagnosis not present

## 2017-02-21 DIAGNOSIS — E785 Hyperlipidemia, unspecified: Secondary | ICD-10-CM | POA: Diagnosis not present

## 2017-02-21 DIAGNOSIS — N183 Chronic kidney disease, stage 3 (moderate): Secondary | ICD-10-CM | POA: Diagnosis not present

## 2017-02-21 DIAGNOSIS — Z8551 Personal history of malignant neoplasm of bladder: Secondary | ICD-10-CM | POA: Diagnosis not present

## 2017-02-21 DIAGNOSIS — R0902 Hypoxemia: Secondary | ICD-10-CM | POA: Diagnosis not present

## 2017-02-22 DIAGNOSIS — I4891 Unspecified atrial fibrillation: Secondary | ICD-10-CM | POA: Diagnosis not present

## 2017-02-22 DIAGNOSIS — I5030 Unspecified diastolic (congestive) heart failure: Secondary | ICD-10-CM | POA: Diagnosis not present

## 2017-02-22 DIAGNOSIS — I359 Nonrheumatic aortic valve disorder, unspecified: Secondary | ICD-10-CM | POA: Diagnosis not present

## 2017-02-22 DIAGNOSIS — N183 Chronic kidney disease, stage 3 (moderate): Secondary | ICD-10-CM | POA: Diagnosis not present

## 2017-02-22 DIAGNOSIS — I519 Heart disease, unspecified: Secondary | ICD-10-CM | POA: Diagnosis not present

## 2017-02-22 DIAGNOSIS — E1159 Type 2 diabetes mellitus with other circulatory complications: Secondary | ICD-10-CM | POA: Diagnosis not present

## 2017-02-25 DIAGNOSIS — N183 Chronic kidney disease, stage 3 (moderate): Secondary | ICD-10-CM | POA: Diagnosis not present

## 2017-02-25 DIAGNOSIS — I4891 Unspecified atrial fibrillation: Secondary | ICD-10-CM | POA: Diagnosis not present

## 2017-02-25 DIAGNOSIS — I5030 Unspecified diastolic (congestive) heart failure: Secondary | ICD-10-CM | POA: Diagnosis not present

## 2017-02-25 DIAGNOSIS — I519 Heart disease, unspecified: Secondary | ICD-10-CM | POA: Diagnosis not present

## 2017-02-25 DIAGNOSIS — I359 Nonrheumatic aortic valve disorder, unspecified: Secondary | ICD-10-CM | POA: Diagnosis not present

## 2017-02-25 DIAGNOSIS — E1159 Type 2 diabetes mellitus with other circulatory complications: Secondary | ICD-10-CM | POA: Diagnosis not present

## 2017-02-27 DIAGNOSIS — N183 Chronic kidney disease, stage 3 (moderate): Secondary | ICD-10-CM | POA: Diagnosis not present

## 2017-02-27 DIAGNOSIS — I359 Nonrheumatic aortic valve disorder, unspecified: Secondary | ICD-10-CM | POA: Diagnosis not present

## 2017-02-27 DIAGNOSIS — I519 Heart disease, unspecified: Secondary | ICD-10-CM | POA: Diagnosis not present

## 2017-02-27 DIAGNOSIS — I5030 Unspecified diastolic (congestive) heart failure: Secondary | ICD-10-CM | POA: Diagnosis not present

## 2017-02-27 DIAGNOSIS — I4891 Unspecified atrial fibrillation: Secondary | ICD-10-CM | POA: Diagnosis not present

## 2017-02-27 DIAGNOSIS — E1159 Type 2 diabetes mellitus with other circulatory complications: Secondary | ICD-10-CM | POA: Diagnosis not present

## 2017-02-28 DIAGNOSIS — I4891 Unspecified atrial fibrillation: Secondary | ICD-10-CM | POA: Diagnosis not present

## 2017-02-28 DIAGNOSIS — I359 Nonrheumatic aortic valve disorder, unspecified: Secondary | ICD-10-CM | POA: Diagnosis not present

## 2017-02-28 DIAGNOSIS — N183 Chronic kidney disease, stage 3 (moderate): Secondary | ICD-10-CM | POA: Diagnosis not present

## 2017-02-28 DIAGNOSIS — I519 Heart disease, unspecified: Secondary | ICD-10-CM | POA: Diagnosis not present

## 2017-02-28 DIAGNOSIS — E1159 Type 2 diabetes mellitus with other circulatory complications: Secondary | ICD-10-CM | POA: Diagnosis not present

## 2017-02-28 DIAGNOSIS — I5030 Unspecified diastolic (congestive) heart failure: Secondary | ICD-10-CM | POA: Diagnosis not present

## 2017-03-01 DIAGNOSIS — I519 Heart disease, unspecified: Secondary | ICD-10-CM | POA: Diagnosis not present

## 2017-03-01 DIAGNOSIS — E1159 Type 2 diabetes mellitus with other circulatory complications: Secondary | ICD-10-CM | POA: Diagnosis not present

## 2017-03-01 DIAGNOSIS — N183 Chronic kidney disease, stage 3 (moderate): Secondary | ICD-10-CM | POA: Diagnosis not present

## 2017-03-01 DIAGNOSIS — I359 Nonrheumatic aortic valve disorder, unspecified: Secondary | ICD-10-CM | POA: Diagnosis not present

## 2017-03-01 DIAGNOSIS — I5030 Unspecified diastolic (congestive) heart failure: Secondary | ICD-10-CM | POA: Diagnosis not present

## 2017-03-01 DIAGNOSIS — I4891 Unspecified atrial fibrillation: Secondary | ICD-10-CM | POA: Diagnosis not present

## 2017-03-02 DIAGNOSIS — E1159 Type 2 diabetes mellitus with other circulatory complications: Secondary | ICD-10-CM | POA: Diagnosis not present

## 2017-03-02 DIAGNOSIS — E039 Hypothyroidism, unspecified: Secondary | ICD-10-CM | POA: Diagnosis not present

## 2017-03-02 DIAGNOSIS — I4891 Unspecified atrial fibrillation: Secondary | ICD-10-CM | POA: Diagnosis not present

## 2017-03-02 DIAGNOSIS — I359 Nonrheumatic aortic valve disorder, unspecified: Secondary | ICD-10-CM | POA: Diagnosis not present

## 2017-03-02 DIAGNOSIS — Z8551 Personal history of malignant neoplasm of bladder: Secondary | ICD-10-CM | POA: Diagnosis not present

## 2017-03-02 DIAGNOSIS — L209 Atopic dermatitis, unspecified: Secondary | ICD-10-CM | POA: Diagnosis not present

## 2017-03-02 DIAGNOSIS — Z85038 Personal history of other malignant neoplasm of large intestine: Secondary | ICD-10-CM | POA: Diagnosis not present

## 2017-03-02 DIAGNOSIS — N183 Chronic kidney disease, stage 3 (moderate): Secondary | ICD-10-CM | POA: Diagnosis not present

## 2017-03-02 DIAGNOSIS — Z853 Personal history of malignant neoplasm of breast: Secondary | ICD-10-CM | POA: Diagnosis not present

## 2017-03-02 DIAGNOSIS — R0902 Hypoxemia: Secondary | ICD-10-CM | POA: Diagnosis not present

## 2017-03-02 DIAGNOSIS — E785 Hyperlipidemia, unspecified: Secondary | ICD-10-CM | POA: Diagnosis not present

## 2017-03-02 DIAGNOSIS — I10 Essential (primary) hypertension: Secondary | ICD-10-CM | POA: Diagnosis not present

## 2017-03-02 DIAGNOSIS — I5030 Unspecified diastolic (congestive) heart failure: Secondary | ICD-10-CM | POA: Diagnosis not present

## 2017-03-02 DIAGNOSIS — I519 Heart disease, unspecified: Secondary | ICD-10-CM | POA: Diagnosis not present

## 2017-03-05 DIAGNOSIS — E1159 Type 2 diabetes mellitus with other circulatory complications: Secondary | ICD-10-CM | POA: Diagnosis not present

## 2017-03-05 DIAGNOSIS — I359 Nonrheumatic aortic valve disorder, unspecified: Secondary | ICD-10-CM | POA: Diagnosis not present

## 2017-03-05 DIAGNOSIS — I4891 Unspecified atrial fibrillation: Secondary | ICD-10-CM | POA: Diagnosis not present

## 2017-03-05 DIAGNOSIS — N183 Chronic kidney disease, stage 3 (moderate): Secondary | ICD-10-CM | POA: Diagnosis not present

## 2017-03-05 DIAGNOSIS — I5030 Unspecified diastolic (congestive) heart failure: Secondary | ICD-10-CM | POA: Diagnosis not present

## 2017-03-05 DIAGNOSIS — I519 Heart disease, unspecified: Secondary | ICD-10-CM | POA: Diagnosis not present

## 2017-03-07 DIAGNOSIS — E1159 Type 2 diabetes mellitus with other circulatory complications: Secondary | ICD-10-CM | POA: Diagnosis not present

## 2017-03-07 DIAGNOSIS — N183 Chronic kidney disease, stage 3 (moderate): Secondary | ICD-10-CM | POA: Diagnosis not present

## 2017-03-07 DIAGNOSIS — I359 Nonrheumatic aortic valve disorder, unspecified: Secondary | ICD-10-CM | POA: Diagnosis not present

## 2017-03-07 DIAGNOSIS — I519 Heart disease, unspecified: Secondary | ICD-10-CM | POA: Diagnosis not present

## 2017-03-07 DIAGNOSIS — I5030 Unspecified diastolic (congestive) heart failure: Secondary | ICD-10-CM | POA: Diagnosis not present

## 2017-03-07 DIAGNOSIS — I4891 Unspecified atrial fibrillation: Secondary | ICD-10-CM | POA: Diagnosis not present

## 2017-03-08 DIAGNOSIS — E1159 Type 2 diabetes mellitus with other circulatory complications: Secondary | ICD-10-CM | POA: Diagnosis not present

## 2017-03-08 DIAGNOSIS — I519 Heart disease, unspecified: Secondary | ICD-10-CM | POA: Diagnosis not present

## 2017-03-08 DIAGNOSIS — I5030 Unspecified diastolic (congestive) heart failure: Secondary | ICD-10-CM | POA: Diagnosis not present

## 2017-03-08 DIAGNOSIS — I4891 Unspecified atrial fibrillation: Secondary | ICD-10-CM | POA: Diagnosis not present

## 2017-03-08 DIAGNOSIS — N183 Chronic kidney disease, stage 3 (moderate): Secondary | ICD-10-CM | POA: Diagnosis not present

## 2017-03-08 DIAGNOSIS — I359 Nonrheumatic aortic valve disorder, unspecified: Secondary | ICD-10-CM | POA: Diagnosis not present

## 2017-03-11 DIAGNOSIS — E1159 Type 2 diabetes mellitus with other circulatory complications: Secondary | ICD-10-CM | POA: Diagnosis not present

## 2017-03-11 DIAGNOSIS — I5030 Unspecified diastolic (congestive) heart failure: Secondary | ICD-10-CM | POA: Diagnosis not present

## 2017-03-11 DIAGNOSIS — I4891 Unspecified atrial fibrillation: Secondary | ICD-10-CM | POA: Diagnosis not present

## 2017-03-11 DIAGNOSIS — I519 Heart disease, unspecified: Secondary | ICD-10-CM | POA: Diagnosis not present

## 2017-03-11 DIAGNOSIS — I359 Nonrheumatic aortic valve disorder, unspecified: Secondary | ICD-10-CM | POA: Diagnosis not present

## 2017-03-11 DIAGNOSIS — N183 Chronic kidney disease, stage 3 (moderate): Secondary | ICD-10-CM | POA: Diagnosis not present

## 2017-03-12 DIAGNOSIS — I4891 Unspecified atrial fibrillation: Secondary | ICD-10-CM | POA: Diagnosis not present

## 2017-03-12 DIAGNOSIS — I519 Heart disease, unspecified: Secondary | ICD-10-CM | POA: Diagnosis not present

## 2017-03-12 DIAGNOSIS — I359 Nonrheumatic aortic valve disorder, unspecified: Secondary | ICD-10-CM | POA: Diagnosis not present

## 2017-03-12 DIAGNOSIS — N183 Chronic kidney disease, stage 3 (moderate): Secondary | ICD-10-CM | POA: Diagnosis not present

## 2017-03-12 DIAGNOSIS — E1159 Type 2 diabetes mellitus with other circulatory complications: Secondary | ICD-10-CM | POA: Diagnosis not present

## 2017-03-12 DIAGNOSIS — I5030 Unspecified diastolic (congestive) heart failure: Secondary | ICD-10-CM | POA: Diagnosis not present

## 2017-03-13 DIAGNOSIS — I5030 Unspecified diastolic (congestive) heart failure: Secondary | ICD-10-CM | POA: Diagnosis not present

## 2017-03-13 DIAGNOSIS — N183 Chronic kidney disease, stage 3 (moderate): Secondary | ICD-10-CM | POA: Diagnosis not present

## 2017-03-13 DIAGNOSIS — E1159 Type 2 diabetes mellitus with other circulatory complications: Secondary | ICD-10-CM | POA: Diagnosis not present

## 2017-03-13 DIAGNOSIS — I4891 Unspecified atrial fibrillation: Secondary | ICD-10-CM | POA: Diagnosis not present

## 2017-03-13 DIAGNOSIS — I359 Nonrheumatic aortic valve disorder, unspecified: Secondary | ICD-10-CM | POA: Diagnosis not present

## 2017-03-13 DIAGNOSIS — I519 Heart disease, unspecified: Secondary | ICD-10-CM | POA: Diagnosis not present

## 2017-03-14 DIAGNOSIS — I5030 Unspecified diastolic (congestive) heart failure: Secondary | ICD-10-CM | POA: Diagnosis not present

## 2017-03-14 DIAGNOSIS — I359 Nonrheumatic aortic valve disorder, unspecified: Secondary | ICD-10-CM | POA: Diagnosis not present

## 2017-03-14 DIAGNOSIS — I519 Heart disease, unspecified: Secondary | ICD-10-CM | POA: Diagnosis not present

## 2017-03-14 DIAGNOSIS — E1159 Type 2 diabetes mellitus with other circulatory complications: Secondary | ICD-10-CM | POA: Diagnosis not present

## 2017-03-14 DIAGNOSIS — N183 Chronic kidney disease, stage 3 (moderate): Secondary | ICD-10-CM | POA: Diagnosis not present

## 2017-03-14 DIAGNOSIS — I4891 Unspecified atrial fibrillation: Secondary | ICD-10-CM | POA: Diagnosis not present

## 2017-03-15 DIAGNOSIS — I519 Heart disease, unspecified: Secondary | ICD-10-CM | POA: Diagnosis not present

## 2017-03-15 DIAGNOSIS — I359 Nonrheumatic aortic valve disorder, unspecified: Secondary | ICD-10-CM | POA: Diagnosis not present

## 2017-03-15 DIAGNOSIS — E1159 Type 2 diabetes mellitus with other circulatory complications: Secondary | ICD-10-CM | POA: Diagnosis not present

## 2017-03-15 DIAGNOSIS — I5030 Unspecified diastolic (congestive) heart failure: Secondary | ICD-10-CM | POA: Diagnosis not present

## 2017-03-15 DIAGNOSIS — N183 Chronic kidney disease, stage 3 (moderate): Secondary | ICD-10-CM | POA: Diagnosis not present

## 2017-03-15 DIAGNOSIS — I4891 Unspecified atrial fibrillation: Secondary | ICD-10-CM | POA: Diagnosis not present

## 2017-03-18 DIAGNOSIS — L853 Xerosis cutis: Secondary | ICD-10-CM | POA: Diagnosis not present

## 2017-03-18 DIAGNOSIS — I519 Heart disease, unspecified: Secondary | ICD-10-CM | POA: Diagnosis not present

## 2017-03-18 DIAGNOSIS — I5030 Unspecified diastolic (congestive) heart failure: Secondary | ICD-10-CM | POA: Diagnosis not present

## 2017-03-18 DIAGNOSIS — Q845 Enlarged and hypertrophic nails: Secondary | ICD-10-CM | POA: Diagnosis not present

## 2017-03-18 DIAGNOSIS — I359 Nonrheumatic aortic valve disorder, unspecified: Secondary | ICD-10-CM | POA: Diagnosis not present

## 2017-03-18 DIAGNOSIS — M2010 Hallux valgus (acquired), unspecified foot: Secondary | ICD-10-CM | POA: Diagnosis not present

## 2017-03-18 DIAGNOSIS — I4891 Unspecified atrial fibrillation: Secondary | ICD-10-CM | POA: Diagnosis not present

## 2017-03-18 DIAGNOSIS — L603 Nail dystrophy: Secondary | ICD-10-CM | POA: Diagnosis not present

## 2017-03-18 DIAGNOSIS — I739 Peripheral vascular disease, unspecified: Secondary | ICD-10-CM | POA: Diagnosis not present

## 2017-03-18 DIAGNOSIS — E1159 Type 2 diabetes mellitus with other circulatory complications: Secondary | ICD-10-CM | POA: Diagnosis not present

## 2017-03-18 DIAGNOSIS — N183 Chronic kidney disease, stage 3 (moderate): Secondary | ICD-10-CM | POA: Diagnosis not present

## 2017-03-18 DIAGNOSIS — B351 Tinea unguium: Secondary | ICD-10-CM | POA: Diagnosis not present

## 2017-03-19 DIAGNOSIS — I359 Nonrheumatic aortic valve disorder, unspecified: Secondary | ICD-10-CM | POA: Diagnosis not present

## 2017-03-19 DIAGNOSIS — N183 Chronic kidney disease, stage 3 (moderate): Secondary | ICD-10-CM | POA: Diagnosis not present

## 2017-03-19 DIAGNOSIS — I519 Heart disease, unspecified: Secondary | ICD-10-CM | POA: Diagnosis not present

## 2017-03-19 DIAGNOSIS — I5030 Unspecified diastolic (congestive) heart failure: Secondary | ICD-10-CM | POA: Diagnosis not present

## 2017-03-19 DIAGNOSIS — I4891 Unspecified atrial fibrillation: Secondary | ICD-10-CM | POA: Diagnosis not present

## 2017-03-19 DIAGNOSIS — E1159 Type 2 diabetes mellitus with other circulatory complications: Secondary | ICD-10-CM | POA: Diagnosis not present

## 2017-03-20 DIAGNOSIS — I5030 Unspecified diastolic (congestive) heart failure: Secondary | ICD-10-CM | POA: Diagnosis not present

## 2017-03-20 DIAGNOSIS — I4891 Unspecified atrial fibrillation: Secondary | ICD-10-CM | POA: Diagnosis not present

## 2017-03-20 DIAGNOSIS — I519 Heart disease, unspecified: Secondary | ICD-10-CM | POA: Diagnosis not present

## 2017-03-20 DIAGNOSIS — I359 Nonrheumatic aortic valve disorder, unspecified: Secondary | ICD-10-CM | POA: Diagnosis not present

## 2017-03-20 DIAGNOSIS — E1159 Type 2 diabetes mellitus with other circulatory complications: Secondary | ICD-10-CM | POA: Diagnosis not present

## 2017-03-20 DIAGNOSIS — N183 Chronic kidney disease, stage 3 (moderate): Secondary | ICD-10-CM | POA: Diagnosis not present

## 2017-03-21 DIAGNOSIS — N183 Chronic kidney disease, stage 3 (moderate): Secondary | ICD-10-CM | POA: Diagnosis not present

## 2017-03-21 DIAGNOSIS — I5030 Unspecified diastolic (congestive) heart failure: Secondary | ICD-10-CM | POA: Diagnosis not present

## 2017-03-21 DIAGNOSIS — I4891 Unspecified atrial fibrillation: Secondary | ICD-10-CM | POA: Diagnosis not present

## 2017-03-21 DIAGNOSIS — I519 Heart disease, unspecified: Secondary | ICD-10-CM | POA: Diagnosis not present

## 2017-03-21 DIAGNOSIS — E1159 Type 2 diabetes mellitus with other circulatory complications: Secondary | ICD-10-CM | POA: Diagnosis not present

## 2017-03-21 DIAGNOSIS — I359 Nonrheumatic aortic valve disorder, unspecified: Secondary | ICD-10-CM | POA: Diagnosis not present

## 2017-03-22 ENCOUNTER — Encounter: Payer: Self-pay | Admitting: *Deleted

## 2017-03-22 DIAGNOSIS — I4891 Unspecified atrial fibrillation: Secondary | ICD-10-CM | POA: Diagnosis not present

## 2017-03-22 DIAGNOSIS — I359 Nonrheumatic aortic valve disorder, unspecified: Secondary | ICD-10-CM | POA: Diagnosis not present

## 2017-03-22 DIAGNOSIS — I5030 Unspecified diastolic (congestive) heart failure: Secondary | ICD-10-CM | POA: Diagnosis not present

## 2017-03-22 DIAGNOSIS — I519 Heart disease, unspecified: Secondary | ICD-10-CM | POA: Diagnosis not present

## 2017-03-22 DIAGNOSIS — N183 Chronic kidney disease, stage 3 (moderate): Secondary | ICD-10-CM | POA: Diagnosis not present

## 2017-03-22 DIAGNOSIS — E1159 Type 2 diabetes mellitus with other circulatory complications: Secondary | ICD-10-CM | POA: Diagnosis not present

## 2017-03-25 DIAGNOSIS — I359 Nonrheumatic aortic valve disorder, unspecified: Secondary | ICD-10-CM | POA: Diagnosis not present

## 2017-03-25 DIAGNOSIS — I5030 Unspecified diastolic (congestive) heart failure: Secondary | ICD-10-CM | POA: Diagnosis not present

## 2017-03-25 DIAGNOSIS — I519 Heart disease, unspecified: Secondary | ICD-10-CM | POA: Diagnosis not present

## 2017-03-25 DIAGNOSIS — N183 Chronic kidney disease, stage 3 (moderate): Secondary | ICD-10-CM | POA: Diagnosis not present

## 2017-03-25 DIAGNOSIS — I4891 Unspecified atrial fibrillation: Secondary | ICD-10-CM | POA: Diagnosis not present

## 2017-03-25 DIAGNOSIS — E1159 Type 2 diabetes mellitus with other circulatory complications: Secondary | ICD-10-CM | POA: Diagnosis not present

## 2017-03-26 DIAGNOSIS — I359 Nonrheumatic aortic valve disorder, unspecified: Secondary | ICD-10-CM | POA: Diagnosis not present

## 2017-03-26 DIAGNOSIS — I4891 Unspecified atrial fibrillation: Secondary | ICD-10-CM | POA: Diagnosis not present

## 2017-03-26 DIAGNOSIS — E1159 Type 2 diabetes mellitus with other circulatory complications: Secondary | ICD-10-CM | POA: Diagnosis not present

## 2017-03-26 DIAGNOSIS — N183 Chronic kidney disease, stage 3 (moderate): Secondary | ICD-10-CM | POA: Diagnosis not present

## 2017-03-26 DIAGNOSIS — I519 Heart disease, unspecified: Secondary | ICD-10-CM | POA: Diagnosis not present

## 2017-03-26 DIAGNOSIS — I5030 Unspecified diastolic (congestive) heart failure: Secondary | ICD-10-CM | POA: Diagnosis not present

## 2017-03-27 DIAGNOSIS — I519 Heart disease, unspecified: Secondary | ICD-10-CM | POA: Diagnosis not present

## 2017-03-27 DIAGNOSIS — I359 Nonrheumatic aortic valve disorder, unspecified: Secondary | ICD-10-CM | POA: Diagnosis not present

## 2017-03-27 DIAGNOSIS — N183 Chronic kidney disease, stage 3 (moderate): Secondary | ICD-10-CM | POA: Diagnosis not present

## 2017-03-27 DIAGNOSIS — I5030 Unspecified diastolic (congestive) heart failure: Secondary | ICD-10-CM | POA: Diagnosis not present

## 2017-03-27 DIAGNOSIS — I4891 Unspecified atrial fibrillation: Secondary | ICD-10-CM | POA: Diagnosis not present

## 2017-03-27 DIAGNOSIS — E1159 Type 2 diabetes mellitus with other circulatory complications: Secondary | ICD-10-CM | POA: Diagnosis not present

## 2017-03-28 DIAGNOSIS — I5030 Unspecified diastolic (congestive) heart failure: Secondary | ICD-10-CM | POA: Diagnosis not present

## 2017-03-28 DIAGNOSIS — I519 Heart disease, unspecified: Secondary | ICD-10-CM | POA: Diagnosis not present

## 2017-03-28 DIAGNOSIS — N183 Chronic kidney disease, stage 3 (moderate): Secondary | ICD-10-CM | POA: Diagnosis not present

## 2017-03-28 DIAGNOSIS — E1159 Type 2 diabetes mellitus with other circulatory complications: Secondary | ICD-10-CM | POA: Diagnosis not present

## 2017-03-28 DIAGNOSIS — I359 Nonrheumatic aortic valve disorder, unspecified: Secondary | ICD-10-CM | POA: Diagnosis not present

## 2017-03-28 DIAGNOSIS — I4891 Unspecified atrial fibrillation: Secondary | ICD-10-CM | POA: Diagnosis not present

## 2017-03-29 DIAGNOSIS — I359 Nonrheumatic aortic valve disorder, unspecified: Secondary | ICD-10-CM | POA: Diagnosis not present

## 2017-03-29 DIAGNOSIS — N183 Chronic kidney disease, stage 3 (moderate): Secondary | ICD-10-CM | POA: Diagnosis not present

## 2017-03-29 DIAGNOSIS — E1159 Type 2 diabetes mellitus with other circulatory complications: Secondary | ICD-10-CM | POA: Diagnosis not present

## 2017-03-29 DIAGNOSIS — I519 Heart disease, unspecified: Secondary | ICD-10-CM | POA: Diagnosis not present

## 2017-03-29 DIAGNOSIS — I5030 Unspecified diastolic (congestive) heart failure: Secondary | ICD-10-CM | POA: Diagnosis not present

## 2017-03-29 DIAGNOSIS — I4891 Unspecified atrial fibrillation: Secondary | ICD-10-CM | POA: Diagnosis not present

## 2017-04-01 DIAGNOSIS — Z85038 Personal history of other malignant neoplasm of large intestine: Secondary | ICD-10-CM | POA: Diagnosis not present

## 2017-04-01 DIAGNOSIS — E1159 Type 2 diabetes mellitus with other circulatory complications: Secondary | ICD-10-CM | POA: Diagnosis not present

## 2017-04-01 DIAGNOSIS — I359 Nonrheumatic aortic valve disorder, unspecified: Secondary | ICD-10-CM | POA: Diagnosis not present

## 2017-04-01 DIAGNOSIS — I4891 Unspecified atrial fibrillation: Secondary | ICD-10-CM | POA: Diagnosis not present

## 2017-04-01 DIAGNOSIS — E039 Hypothyroidism, unspecified: Secondary | ICD-10-CM | POA: Diagnosis not present

## 2017-04-01 DIAGNOSIS — Z8551 Personal history of malignant neoplasm of bladder: Secondary | ICD-10-CM | POA: Diagnosis not present

## 2017-04-01 DIAGNOSIS — I10 Essential (primary) hypertension: Secondary | ICD-10-CM | POA: Diagnosis not present

## 2017-04-01 DIAGNOSIS — N183 Chronic kidney disease, stage 3 (moderate): Secondary | ICD-10-CM | POA: Diagnosis not present

## 2017-04-01 DIAGNOSIS — Z853 Personal history of malignant neoplasm of breast: Secondary | ICD-10-CM | POA: Diagnosis not present

## 2017-04-01 DIAGNOSIS — E785 Hyperlipidemia, unspecified: Secondary | ICD-10-CM | POA: Diagnosis not present

## 2017-04-01 DIAGNOSIS — I519 Heart disease, unspecified: Secondary | ICD-10-CM | POA: Diagnosis not present

## 2017-04-01 DIAGNOSIS — R0902 Hypoxemia: Secondary | ICD-10-CM | POA: Diagnosis not present

## 2017-04-01 DIAGNOSIS — L209 Atopic dermatitis, unspecified: Secondary | ICD-10-CM | POA: Diagnosis not present

## 2017-04-01 DIAGNOSIS — I5030 Unspecified diastolic (congestive) heart failure: Secondary | ICD-10-CM | POA: Diagnosis not present

## 2017-04-03 DIAGNOSIS — I5030 Unspecified diastolic (congestive) heart failure: Secondary | ICD-10-CM | POA: Diagnosis not present

## 2017-04-03 DIAGNOSIS — E1159 Type 2 diabetes mellitus with other circulatory complications: Secondary | ICD-10-CM | POA: Diagnosis not present

## 2017-04-03 DIAGNOSIS — I4891 Unspecified atrial fibrillation: Secondary | ICD-10-CM | POA: Diagnosis not present

## 2017-04-03 DIAGNOSIS — I359 Nonrheumatic aortic valve disorder, unspecified: Secondary | ICD-10-CM | POA: Diagnosis not present

## 2017-04-03 DIAGNOSIS — N183 Chronic kidney disease, stage 3 (moderate): Secondary | ICD-10-CM | POA: Diagnosis not present

## 2017-04-03 DIAGNOSIS — I519 Heart disease, unspecified: Secondary | ICD-10-CM | POA: Diagnosis not present

## 2017-04-04 DIAGNOSIS — I519 Heart disease, unspecified: Secondary | ICD-10-CM | POA: Diagnosis not present

## 2017-04-04 DIAGNOSIS — I359 Nonrheumatic aortic valve disorder, unspecified: Secondary | ICD-10-CM | POA: Diagnosis not present

## 2017-04-04 DIAGNOSIS — E1159 Type 2 diabetes mellitus with other circulatory complications: Secondary | ICD-10-CM | POA: Diagnosis not present

## 2017-04-04 DIAGNOSIS — N183 Chronic kidney disease, stage 3 (moderate): Secondary | ICD-10-CM | POA: Diagnosis not present

## 2017-04-04 DIAGNOSIS — I4891 Unspecified atrial fibrillation: Secondary | ICD-10-CM | POA: Diagnosis not present

## 2017-04-04 DIAGNOSIS — I5030 Unspecified diastolic (congestive) heart failure: Secondary | ICD-10-CM | POA: Diagnosis not present

## 2017-04-05 DIAGNOSIS — I519 Heart disease, unspecified: Secondary | ICD-10-CM | POA: Diagnosis not present

## 2017-04-05 DIAGNOSIS — I359 Nonrheumatic aortic valve disorder, unspecified: Secondary | ICD-10-CM | POA: Diagnosis not present

## 2017-04-05 DIAGNOSIS — I4891 Unspecified atrial fibrillation: Secondary | ICD-10-CM | POA: Diagnosis not present

## 2017-04-05 DIAGNOSIS — E1159 Type 2 diabetes mellitus with other circulatory complications: Secondary | ICD-10-CM | POA: Diagnosis not present

## 2017-04-05 DIAGNOSIS — N183 Chronic kidney disease, stage 3 (moderate): Secondary | ICD-10-CM | POA: Diagnosis not present

## 2017-04-05 DIAGNOSIS — I5030 Unspecified diastolic (congestive) heart failure: Secondary | ICD-10-CM | POA: Diagnosis not present

## 2017-04-08 DIAGNOSIS — I5030 Unspecified diastolic (congestive) heart failure: Secondary | ICD-10-CM | POA: Diagnosis not present

## 2017-04-08 DIAGNOSIS — I519 Heart disease, unspecified: Secondary | ICD-10-CM | POA: Diagnosis not present

## 2017-04-08 DIAGNOSIS — I359 Nonrheumatic aortic valve disorder, unspecified: Secondary | ICD-10-CM | POA: Diagnosis not present

## 2017-04-08 DIAGNOSIS — E1159 Type 2 diabetes mellitus with other circulatory complications: Secondary | ICD-10-CM | POA: Diagnosis not present

## 2017-04-08 DIAGNOSIS — N183 Chronic kidney disease, stage 3 (moderate): Secondary | ICD-10-CM | POA: Diagnosis not present

## 2017-04-08 DIAGNOSIS — I4891 Unspecified atrial fibrillation: Secondary | ICD-10-CM | POA: Diagnosis not present

## 2017-04-09 DIAGNOSIS — I4891 Unspecified atrial fibrillation: Secondary | ICD-10-CM | POA: Diagnosis not present

## 2017-04-09 DIAGNOSIS — E1159 Type 2 diabetes mellitus with other circulatory complications: Secondary | ICD-10-CM | POA: Diagnosis not present

## 2017-04-09 DIAGNOSIS — I519 Heart disease, unspecified: Secondary | ICD-10-CM | POA: Diagnosis not present

## 2017-04-09 DIAGNOSIS — I5030 Unspecified diastolic (congestive) heart failure: Secondary | ICD-10-CM | POA: Diagnosis not present

## 2017-04-09 DIAGNOSIS — I359 Nonrheumatic aortic valve disorder, unspecified: Secondary | ICD-10-CM | POA: Diagnosis not present

## 2017-04-09 DIAGNOSIS — N183 Chronic kidney disease, stage 3 (moderate): Secondary | ICD-10-CM | POA: Diagnosis not present

## 2017-04-10 DIAGNOSIS — E1159 Type 2 diabetes mellitus with other circulatory complications: Secondary | ICD-10-CM | POA: Diagnosis not present

## 2017-04-10 DIAGNOSIS — I5030 Unspecified diastolic (congestive) heart failure: Secondary | ICD-10-CM | POA: Diagnosis not present

## 2017-04-10 DIAGNOSIS — I519 Heart disease, unspecified: Secondary | ICD-10-CM | POA: Diagnosis not present

## 2017-04-10 DIAGNOSIS — I4891 Unspecified atrial fibrillation: Secondary | ICD-10-CM | POA: Diagnosis not present

## 2017-04-10 DIAGNOSIS — N183 Chronic kidney disease, stage 3 (moderate): Secondary | ICD-10-CM | POA: Diagnosis not present

## 2017-04-10 DIAGNOSIS — I359 Nonrheumatic aortic valve disorder, unspecified: Secondary | ICD-10-CM | POA: Diagnosis not present

## 2017-04-11 ENCOUNTER — Inpatient Hospital Stay (HOSPITAL_COMMUNITY)
Admission: EM | Admit: 2017-04-11 | Discharge: 2017-04-14 | DRG: 309 | Disposition: A | Discharge status: Hospice | Attending: Internal Medicine | Admitting: Internal Medicine

## 2017-04-11 ENCOUNTER — Encounter (HOSPITAL_COMMUNITY): Payer: Self-pay

## 2017-04-11 ENCOUNTER — Emergency Department (HOSPITAL_COMMUNITY)

## 2017-04-11 DIAGNOSIS — R109 Unspecified abdominal pain: Secondary | ICD-10-CM | POA: Diagnosis not present

## 2017-04-11 DIAGNOSIS — I482 Chronic atrial fibrillation: Secondary | ICD-10-CM | POA: Diagnosis not present

## 2017-04-11 DIAGNOSIS — I4891 Unspecified atrial fibrillation: Secondary | ICD-10-CM | POA: Diagnosis present

## 2017-04-11 DIAGNOSIS — E039 Hypothyroidism, unspecified: Secondary | ICD-10-CM | POA: Diagnosis present

## 2017-04-11 DIAGNOSIS — Z9981 Dependence on supplemental oxygen: Secondary | ICD-10-CM

## 2017-04-11 DIAGNOSIS — Z66 Do not resuscitate: Secondary | ICD-10-CM | POA: Diagnosis present

## 2017-04-11 DIAGNOSIS — E785 Hyperlipidemia, unspecified: Secondary | ICD-10-CM | POA: Diagnosis present

## 2017-04-11 DIAGNOSIS — Z853 Personal history of malignant neoplasm of breast: Secondary | ICD-10-CM

## 2017-04-11 DIAGNOSIS — G4733 Obstructive sleep apnea (adult) (pediatric): Secondary | ICD-10-CM | POA: Diagnosis present

## 2017-04-11 DIAGNOSIS — Z79899 Other long term (current) drug therapy: Secondary | ICD-10-CM

## 2017-04-11 DIAGNOSIS — Z85038 Personal history of other malignant neoplasm of large intestine: Secondary | ICD-10-CM

## 2017-04-11 DIAGNOSIS — R002 Palpitations: Secondary | ICD-10-CM | POA: Diagnosis not present

## 2017-04-11 DIAGNOSIS — E119 Type 2 diabetes mellitus without complications: Secondary | ICD-10-CM | POA: Diagnosis present

## 2017-04-11 DIAGNOSIS — R140 Abdominal distension (gaseous): Secondary | ICD-10-CM | POA: Diagnosis not present

## 2017-04-11 DIAGNOSIS — Z8249 Family history of ischemic heart disease and other diseases of the circulatory system: Secondary | ICD-10-CM

## 2017-04-11 DIAGNOSIS — N183 Chronic kidney disease, stage 3 (moderate): Secondary | ICD-10-CM | POA: Diagnosis not present

## 2017-04-11 DIAGNOSIS — I5030 Unspecified diastolic (congestive) heart failure: Secondary | ICD-10-CM | POA: Diagnosis not present

## 2017-04-11 DIAGNOSIS — I872 Venous insufficiency (chronic) (peripheral): Secondary | ICD-10-CM | POA: Diagnosis present

## 2017-04-11 DIAGNOSIS — I110 Hypertensive heart disease with heart failure: Secondary | ICD-10-CM | POA: Diagnosis present

## 2017-04-11 DIAGNOSIS — I359 Nonrheumatic aortic valve disorder, unspecified: Secondary | ICD-10-CM | POA: Diagnosis not present

## 2017-04-11 DIAGNOSIS — Z8551 Personal history of malignant neoplasm of bladder: Secondary | ICD-10-CM

## 2017-04-11 DIAGNOSIS — Z9049 Acquired absence of other specified parts of digestive tract: Secondary | ICD-10-CM

## 2017-04-11 DIAGNOSIS — N390 Urinary tract infection, site not specified: Secondary | ICD-10-CM | POA: Diagnosis present

## 2017-04-11 DIAGNOSIS — Z87891 Personal history of nicotine dependence: Secondary | ICD-10-CM

## 2017-04-11 DIAGNOSIS — I878 Other specified disorders of veins: Secondary | ICD-10-CM | POA: Diagnosis present

## 2017-04-11 DIAGNOSIS — Z90710 Acquired absence of both cervix and uterus: Secondary | ICD-10-CM

## 2017-04-11 DIAGNOSIS — Y95 Nosocomial condition: Secondary | ICD-10-CM | POA: Diagnosis present

## 2017-04-11 DIAGNOSIS — E1159 Type 2 diabetes mellitus with other circulatory complications: Secondary | ICD-10-CM | POA: Diagnosis not present

## 2017-04-11 DIAGNOSIS — I10 Essential (primary) hypertension: Secondary | ICD-10-CM | POA: Diagnosis present

## 2017-04-11 DIAGNOSIS — N281 Cyst of kidney, acquired: Secondary | ICD-10-CM | POA: Diagnosis not present

## 2017-04-11 DIAGNOSIS — E860 Dehydration: Secondary | ICD-10-CM | POA: Diagnosis present

## 2017-04-11 DIAGNOSIS — B9620 Unspecified Escherichia coli [E. coli] as the cause of diseases classified elsewhere: Secondary | ICD-10-CM | POA: Diagnosis present

## 2017-04-11 DIAGNOSIS — I519 Heart disease, unspecified: Secondary | ICD-10-CM | POA: Diagnosis not present

## 2017-04-11 DIAGNOSIS — Z8543 Personal history of malignant neoplasm of ovary: Secondary | ICD-10-CM

## 2017-04-11 DIAGNOSIS — J189 Pneumonia, unspecified organism: Secondary | ICD-10-CM | POA: Diagnosis present

## 2017-04-11 DIAGNOSIS — I5032 Chronic diastolic (congestive) heart failure: Secondary | ICD-10-CM | POA: Diagnosis present

## 2017-04-11 DIAGNOSIS — Z8541 Personal history of malignant neoplasm of cervix uteri: Secondary | ICD-10-CM

## 2017-04-11 DIAGNOSIS — K529 Noninfective gastroenteritis and colitis, unspecified: Secondary | ICD-10-CM | POA: Diagnosis present

## 2017-04-11 DIAGNOSIS — Z7901 Long term (current) use of anticoagulants: Secondary | ICD-10-CM

## 2017-04-11 LAB — COMPREHENSIVE METABOLIC PANEL
ALT: 13 U/L — AB (ref 14–54)
AST: 23 U/L (ref 15–41)
Albumin: 4 g/dL (ref 3.5–5.0)
Alkaline Phosphatase: 70 U/L (ref 38–126)
Anion gap: 12 (ref 5–15)
BUN: 43 mg/dL — AB (ref 6–20)
CHLORIDE: 102 mmol/L (ref 101–111)
CO2: 25 mmol/L (ref 22–32)
CREATININE: 1.14 mg/dL — AB (ref 0.44–1.00)
Calcium: 9.5 mg/dL (ref 8.9–10.3)
GFR calc non Af Amer: 40 mL/min — ABNORMAL LOW (ref >60)
GFR, EST AFRICAN AMERICAN: 46 mL/min — AB (ref >60)
Glucose, Bld: 144 mg/dL — ABNORMAL HIGH (ref 65–99)
Potassium: 4.6 mmol/L (ref 3.5–5.1)
SODIUM: 139 mmol/L (ref 135–145)
Total Bilirubin: 1.1 mg/dL (ref 0.3–1.2)
Total Protein: 7.2 g/dL (ref 6.5–8.1)

## 2017-04-11 LAB — CBC
HCT: 45.7 % (ref 36.0–46.0)
Hemoglobin: 15.2 g/dL — ABNORMAL HIGH (ref 12.0–15.0)
MCH: 30 pg (ref 26.0–34.0)
MCHC: 33.3 g/dL (ref 30.0–36.0)
MCV: 90.3 fL (ref 78.0–100.0)
PLATELETS: 161 10*3/uL (ref 150–400)
RBC: 5.06 MIL/uL (ref 3.87–5.11)
RDW: 16 % — ABNORMAL HIGH (ref 11.5–15.5)
WBC: 7.5 10*3/uL (ref 4.0–10.5)

## 2017-04-11 LAB — LIPASE, BLOOD: LIPASE: 26 U/L (ref 11–51)

## 2017-04-11 MED ORDER — ONDANSETRON HCL 4 MG/2ML IJ SOLN
4.0000 mg | Freq: Once | INTRAMUSCULAR | Status: AC
  Administered 2017-04-11: 4 mg via INTRAVENOUS
  Filled 2017-04-11: qty 2

## 2017-04-11 MED ORDER — SODIUM CHLORIDE 0.9 % IV BOLUS (SEPSIS)
500.0000 mL | Freq: Once | INTRAVENOUS | Status: AC
  Administered 2017-04-11: 500 mL via INTRAVENOUS

## 2017-04-11 MED ORDER — SODIUM CHLORIDE 0.9 % IV SOLN
INTRAVENOUS | Status: DC
  Administered 2017-04-12: 07:00:00 via INTRAVENOUS

## 2017-04-11 NOTE — ED Provider Notes (Signed)
[facility] DEPT Provider Note   CSN: [id number] Arrival date & time: 04/11/17  2203     History   Chief Complaint Chief Complaint  Patient presents with  . Nausea  . Emesis  . Abdominal Pain    HPI [name] is a 83 y.o. female.  83 year old female who presents with several days of nonbilious vomiting along with diffuse abdominal discomfort. She does have a history of bowel obstruction in the past. She also has a history of colon cancer as well as bladder cancer. No reported fever or chills. Nodiarrhea. Has had decreased oral intake and notes increased weakness.She is currently on hospice therapy. Family has noted increased abdominal distention.Nothing makes the symptoms worse and no treatment use prior to arrival.      Past Medical History:  Diagnosis Date  . Bladder cancer ([place])   . Bowel obstruction ([place]) 08/25/2014  . Breast cancer ([place])   . Cervical cancer ([place])   . Chronic diastolic heart failure ([place])    a. Echo 5/12: Mild LVH, EF 55-60%, mild AI, mild MR, severe LAE, mild RAE, PASP 31, small pericardial effusion  . Colon cancer ([place])    "polyp" (12/08/2012)  . Diastolic CHF, chronic ([place]) 11/11/2012   Class 2b-3 2 d echo 5/12 mild LVH EF 55-50%, MILD AI, MILD MR, SEVERE LAE, mild RAE, PASP 31, SMALL PERICRADIAL EFFUSION  . DM2 (diabetes mellitus, type 2) ([place])    TYPE 2  . H/O ovarian cancer 11/11/2012    MUCINOUS CYST S/P RESECTION 35 YEARS AGO  . HCAP (healthcare-associated pneumonia) 07/19/2015  . HLD (hyperlipidemia)   . Hx of bladder cancer 11/11/2012  . Hx of cervical cancer 11/11/2012  . Hypertension   . Hypothyroidism   . OSA (obstructive sleep apnea)   . Ovarian cancer ([place])   . Permanent atrial fibrillation ([facility])    a. coumadin d/c'd => Pradaxa in 05/2012  . S/P hysterectomy 11/11/2012  . SBO (small bowel obstruction) ([place])   . SBO (small bowel obstruction) ([place]) 08/25/2014  . Venous insufficiency    chronic LE edema    Patient Active  Problem List   Diagnosis Date Noted  . Cellulitis and abscess of trunk 02/02/2017  . Cellulitis 02/02/2017  . Pressure ulcer 02/02/2017  . Severe obesity (BMI >= 40) ([place]) 09/05/2016  . Chronic venous stasis dermatitis of both lower extremities 09/05/2016  . MRSA carrier 09/05/2016  . Chronic cutaneous venous stasis ulcer ([place]) 08/03/2016  . Goals of care, counseling/discussion   . Diabetes mellitus with complication ([place])   . Bilateral leg edema 07/30/2016  . Acute respiratory failure with hypoxia ([place]) 07/30/2016  . HCAP (healthcare-associated pneumonia) 07/19/2015  . Essential hypertension 03/23/2013  . Nausea & vomiting 11/11/2012  . Hypothyroidism 11/11/2012  . Hx SBO 11/11/2012  . Colon cancer ([place]) 11/11/2012  . Acute on chronic diastolic CHF (congestive heart failure) ([place]) 11/11/2012  . Venous insufficiency 11/11/2012  . Hyperlipidemia 11/11/2012  . OSA (obstructive sleep apnea) 11/11/2012  . HX: breast cancer 11/11/2012  . Hx of cervical cancer 11/11/2012  . H/O ovarian cancer 11/11/2012  . Hx of bladder cancer 11/11/2012  . S/P hysterectomy 11/11/2012  . SBO (small bowel obstruction) ([place]) 12/06/2011  . Permanent atrial fibrillation ([place]) 02/17/2011    Past Surgical History:  Procedure Laterality Date  . APPENDECTOMY    . BLADDER SURGERY    . BREAST BIOPSY Bilateral   . BREAST LUMPECTOMY Right   . CATARACT EXTRACTION W/ INTRAOCULAR LENS  IMPLANT,  BILATERAL    . EXPLORATORY LAPAROTOMY WITH ABDOMINAL MASS EXCISION     "21# ovarian tumor; benign" (12/08/2012)  . I&D EXTREMITY Right 12/31/2012   Procedure: IRRIGATION AND DEBRIDEMENT RIGHT KNEE ULCER WITH PLACEMENT OF A CELL AND VAC ;  Surgeon: Theodoro Kos, DO;  Location: [facility];  Service: Plastics;  Laterality: Right;  . MASTECTOMY, RADICAL Left   . VAGINAL HYSTERECTOMY      OB History    No data available       Home Medications    Prior to Admission medications   Medication Sig Start Date End Date Taking?  Authorizing Provider  acetaminophen (TYLENOL) 325 MG tablet Take 650 mg by mouth every 6 (six) hours as needed (for pain).   Yes [provider]  bisacodyl (DULCOLAX) 10 MG suppository Place 10 mg rectally daily as needed (for constipation).    Yes [provider]  bisacodyl (DULCOLAX) 5 MG EC tablet Take 5 mg by mouth daily.    Yes [provider]  clobetasol cream (TEMOVATE) 8.67 % Apply 1 application topically daily as needed (rash).   Yes [provider]  cyanocobalamin (,VITAMIN B-12,) 1000 MCG/ML injection Inject 1,000 mcg into the muscle every 30 (thirty) days. GIVEN ON THE LAST DAY OF EACH MONTH   Yes [provider]  diltiazem (CARDIZEM CD) 120 MG 24 hr capsule Take 3 capsules (360 mg total) by mouth daily. 09/06/16  Yes Rama, Venetia Maxon, MD  ferrous sulfate 325 (65 FE) MG tablet Take 325 mg by mouth daily with breakfast.   Yes [provider]  furosemide (LASIX) 40 MG tablet Take 20-60 mg by mouth See admin instructions. 60 mg two times a day SCHEDULED and an additional 20 mg once a day AS NEEDED for a weight gain of 2 pounds or greater in a period of 24 hours   Yes [provider]  levothyroxine (SYNTHROID, LEVOTHROID) 150 MCG tablet Take 150 mcg by mouth daily before breakfast.   Yes [provider]  lisinopril (PRINIVIL,ZESTRIL) 5 MG tablet Take 5 mg by mouth daily.   Yes [provider]  Neomycin-Bacitracin-Polymyxin (TRIPLE ANTIBIOTIC) [phone] OINT Apply 1 application topically See admin instructions. APPLY TO BILATERAL LEGS IN THE MORNING   Yes [provider]  OXYGEN Inhale 2 L into the lungs See admin instructions. AS NEEDED WHEN AMBULATING OR WHEN EXERTED   Yes [provider]  potassium chloride SA (K-DUR,KLOR-CON) 20 MEQ tablet Take 1 tablet (20 mEq total) by mouth 2 (two) times daily. 09/06/16  Yes Rama, Venetia Maxon, MD  senna-docusate (SENOKOT-S) 8.6-50 MG per tablet Take 1 tablet  by mouth 2 (two) times daily. 08/31/14  Yes Vann, Jessica U, DO  sodium phosphate (FLEET) 7-19 GM/118ML ENEM Place 133 mLs (1 enema total) rectally daily as needed for severe constipation. 08/31/14  Yes Geradine Girt, DO  Zinc Oxide (DESITIN) 40 % PSTE Apply 1 application topically 3 (three) times daily. APPLY TO BUTTOCKS   Yes [provider]  apixaban (ELIQUIS) 5 MG TABS tablet Take 1 tablet (5 mg total) by mouth 2 (two) times daily. Patient not taking: Reported on 04/11/2017 01/18/14   Thompson Grayer, MD  UNABLE TO FIND Oxygen at 2 liters nasal cannula 09/06/16   Rama, Venetia Maxon, MD    Family History Family History  Problem Relation Age of Onset  . Hypertension Other     Social History Social History  Substance Use Topics  . Smoking status: Former Smoker  Packs/day: 3.00    Years: 25.00    Types: Cigarettes    Quit date: 07/02/1966  . Smokeless tobacco: Never Used  . Alcohol use No     Allergies   Morphine and related; Statins; Zetia [ezetimibe]; 5-alpha reductase inhibitors; Atorvastatin; Crestor [rosuvastatin calcium]; Metoprolol succinate [metoprolol]; Morphine; Nsaids; and Codeine   Review of Systems Review of Systems  All other systems reviewed and are negative.    Physical Exam Updated Vital Signs Pulse (!) 118   Temp 98.1 F (36.7 C) (Oral)   Resp 18   Ht 1.549 m (5\' 1" )   Wt 92.1 kg (203 lb)   SpO2 96%   BMI 38.36 kg/m   Physical Exam  Constitutional: She is oriented to person, place, and time. She appears well-developed and well-nourished.  Non-toxic appearance. No distress.  HENT:  Head: Normocephalic and atraumatic.  Eyes: Pupils are equal, round, and reactive to light. Conjunctivae, EOM and lids are normal.  Neck: Normal range of motion. Neck supple. No tracheal deviation present. No thyroid mass present.  Cardiovascular: Normal rate, regular rhythm and normal heart sounds.  Exam reveals no gallop.   No murmur heard. Pulmonary/Chest: Effort  normal and breath sounds normal. No stridor. No respiratory distress. She has no decreased breath sounds. She has no wheezes. She has no rhonchi. She has no rales.  Abdominal: Soft. Normal appearance and bowel sounds are normal. She exhibits distension. There is generalized tenderness. There is guarding. There is no rigidity, no rebound and no CVA tenderness.    Musculoskeletal: Normal range of motion. She exhibits no edema or tenderness.  Neurological: She is alert and oriented to person, place, and time. She has normal strength. No cranial nerve deficit or sensory deficit. GCS eye subscore is 4. GCS verbal subscore is 5. GCS motor subscore is 6.  Skin: Skin is warm and dry. No abrasion and no rash noted.  Psychiatric: Her affect is blunt. Her speech is delayed. She is slowed.  Nursing note and vitals reviewed.    ED Treatments / Results  Labs (all labs ordered are listed, but only abnormal results are displayed) Labs Reviewed  COMPREHENSIVE METABOLIC PANEL - Abnormal; Notable for the following:       Result Value   Glucose, Bld 144 (*)    BUN 43 (*)    Creatinine, Ser 1.14 (*)    ALT 13 (*)    GFR calc non Af Amer 40 (*)    GFR calc Af Amer 46 (*)    All other components within normal limits  CBC - Abnormal; Notable for the following:    Hemoglobin 15.2 (*)    RDW 16.0 (*)    All other components within normal limits  LIPASE, BLOOD  URINALYSIS, ROUTINE W REFLEX MICROSCOPIC    EKG  EKG Interpretation None       Radiology No results found.  Procedures Procedures (including critical care time)  Medications Ordered in ED Medications  0.9 %  sodium chloride infusion (not administered)  sodium chloride 0.9 % bolus 500 mL (not administered)  ondansetron (ZOFRAN) injection 4 mg (not administered)     Initial Impression / Assessment and Plan / ED Course  I have reviewed the triage vital signs and the nursing notes.  Pertinent labs & imaging results that were  available during my care of the patient were reviewed by me and considered in my medical decision making (see chart for details).     Patient presented initially with concerns  for possible dehydration and small bowel obstruction. Was even IV hydration here as well as Zofran. Patient did have emesis prior to receiving Zofran Urinalysis positive for infection and abdominal CT positive for pneumonia. Will consult hospitalist for admission  Final Clinical Impressions(s) / ED Diagnoses   Final diagnoses:  None    New Prescriptions New Prescriptions   No medications on file     Lacretia Leigh, MD 04/12/17 [phone]

## 2017-04-11 NOTE — ED Notes (Signed)
Per EMS, hospice and pt family aware opt is at [facility] ED.

## 2017-04-11 NOTE — ED Notes (Signed)
Patient transported to X-ray 

## 2017-04-11 NOTE — ED Triage Notes (Signed)
Pt from [facility] via EMS for N/V and abd pain x 2-3 days. Per staff at [facility], pt abd more distended than usual. Pt reports abd pain worse with palpation, abd distention noted. Pt reports last BM yesterday. Denies fever, chills, diarrhea. A&Ox4. 20 G R wrist. EMS VS: 119/48, HR 120, 96% on 2L O2, 18 RR. Pt on 2L O2 at baseline.

## 2017-04-12 ENCOUNTER — Emergency Department (HOSPITAL_COMMUNITY)

## 2017-04-12 DIAGNOSIS — I10 Essential (primary) hypertension: Secondary | ICD-10-CM | POA: Diagnosis not present

## 2017-04-12 DIAGNOSIS — I4891 Unspecified atrial fibrillation: Secondary | ICD-10-CM

## 2017-04-12 DIAGNOSIS — K529 Noninfective gastroenteritis and colitis, unspecified: Secondary | ICD-10-CM

## 2017-04-12 DIAGNOSIS — N390 Urinary tract infection, site not specified: Secondary | ICD-10-CM

## 2017-04-12 DIAGNOSIS — E860 Dehydration: Secondary | ICD-10-CM

## 2017-04-12 DIAGNOSIS — I872 Venous insufficiency (chronic) (peripheral): Secondary | ICD-10-CM

## 2017-04-12 DIAGNOSIS — Z7901 Long term (current) use of anticoagulants: Secondary | ICD-10-CM | POA: Diagnosis not present

## 2017-04-12 DIAGNOSIS — Z85038 Personal history of other malignant neoplasm of large intestine: Secondary | ICD-10-CM | POA: Diagnosis not present

## 2017-04-12 DIAGNOSIS — G4733 Obstructive sleep apnea (adult) (pediatric): Secondary | ICD-10-CM | POA: Diagnosis present

## 2017-04-12 DIAGNOSIS — Z8541 Personal history of malignant neoplasm of cervix uteri: Secondary | ICD-10-CM | POA: Diagnosis not present

## 2017-04-12 DIAGNOSIS — Z8543 Personal history of malignant neoplasm of ovary: Secondary | ICD-10-CM | POA: Diagnosis not present

## 2017-04-12 DIAGNOSIS — Z853 Personal history of malignant neoplasm of breast: Secondary | ICD-10-CM | POA: Diagnosis not present

## 2017-04-12 DIAGNOSIS — Z9981 Dependence on supplemental oxygen: Secondary | ICD-10-CM | POA: Diagnosis not present

## 2017-04-12 DIAGNOSIS — E1159 Type 2 diabetes mellitus with other circulatory complications: Secondary | ICD-10-CM | POA: Diagnosis not present

## 2017-04-12 DIAGNOSIS — I5032 Chronic diastolic (congestive) heart failure: Secondary | ICD-10-CM | POA: Diagnosis present

## 2017-04-12 DIAGNOSIS — E119 Type 2 diabetes mellitus without complications: Secondary | ICD-10-CM | POA: Diagnosis present

## 2017-04-12 DIAGNOSIS — I110 Hypertensive heart disease with heart failure: Secondary | ICD-10-CM | POA: Diagnosis present

## 2017-04-12 DIAGNOSIS — N183 Chronic kidney disease, stage 3 (moderate): Secondary | ICD-10-CM | POA: Diagnosis not present

## 2017-04-12 DIAGNOSIS — I482 Chronic atrial fibrillation: Secondary | ICD-10-CM | POA: Diagnosis present

## 2017-04-12 DIAGNOSIS — E039 Hypothyroidism, unspecified: Secondary | ICD-10-CM | POA: Diagnosis not present

## 2017-04-12 DIAGNOSIS — Z79899 Other long term (current) drug therapy: Secondary | ICD-10-CM | POA: Diagnosis not present

## 2017-04-12 DIAGNOSIS — I5030 Unspecified diastolic (congestive) heart failure: Secondary | ICD-10-CM | POA: Diagnosis not present

## 2017-04-12 DIAGNOSIS — Z8249 Family history of ischemic heart disease and other diseases of the circulatory system: Secondary | ICD-10-CM | POA: Diagnosis not present

## 2017-04-12 DIAGNOSIS — I519 Heart disease, unspecified: Secondary | ICD-10-CM | POA: Diagnosis not present

## 2017-04-12 DIAGNOSIS — N281 Cyst of kidney, acquired: Secondary | ICD-10-CM | POA: Diagnosis not present

## 2017-04-12 DIAGNOSIS — Z90710 Acquired absence of both cervix and uterus: Secondary | ICD-10-CM | POA: Diagnosis not present

## 2017-04-12 DIAGNOSIS — I878 Other specified disorders of veins: Secondary | ICD-10-CM | POA: Diagnosis present

## 2017-04-12 DIAGNOSIS — R002 Palpitations: Secondary | ICD-10-CM | POA: Diagnosis present

## 2017-04-12 DIAGNOSIS — Z8551 Personal history of malignant neoplasm of bladder: Secondary | ICD-10-CM | POA: Diagnosis not present

## 2017-04-12 DIAGNOSIS — I359 Nonrheumatic aortic valve disorder, unspecified: Secondary | ICD-10-CM | POA: Diagnosis not present

## 2017-04-12 DIAGNOSIS — Z66 Do not resuscitate: Secondary | ICD-10-CM | POA: Diagnosis present

## 2017-04-12 DIAGNOSIS — R140 Abdominal distension (gaseous): Secondary | ICD-10-CM | POA: Diagnosis not present

## 2017-04-12 DIAGNOSIS — J189 Pneumonia, unspecified organism: Secondary | ICD-10-CM | POA: Diagnosis not present

## 2017-04-12 DIAGNOSIS — Y95 Nosocomial condition: Secondary | ICD-10-CM | POA: Diagnosis present

## 2017-04-12 DIAGNOSIS — Z87891 Personal history of nicotine dependence: Secondary | ICD-10-CM | POA: Diagnosis not present

## 2017-04-12 DIAGNOSIS — E785 Hyperlipidemia, unspecified: Secondary | ICD-10-CM | POA: Diagnosis present

## 2017-04-12 LAB — COMPREHENSIVE METABOLIC PANEL
ALBUMIN: 1.3 g/dL — AB (ref 3.5–5.0)
ALK PHOS: 24 U/L — AB (ref 38–126)
ALT: 7 U/L — ABNORMAL LOW (ref 14–54)
AST: 9 U/L — ABNORMAL LOW (ref 15–41)
Anion gap: 3 — ABNORMAL LOW (ref 5–15)
BILIRUBIN TOTAL: 0.6 mg/dL (ref 0.3–1.2)
BUN: 22 mg/dL — AB (ref 6–20)
CO2: 15 mmol/L — ABNORMAL LOW (ref 22–32)
Chloride: 126 mmol/L — ABNORMAL HIGH (ref 101–111)
Creatinine, Ser: 0.36 mg/dL — ABNORMAL LOW (ref 0.44–1.00)
GFR calc Af Amer: >60 mL/min (ref >60)
GFR calc non Af Amer: >60 mL/min (ref >60)
GLUCOSE: 123 mg/dL — AB (ref 65–99)
Sodium: 144 mmol/L (ref 135–145)

## 2017-04-12 LAB — BASIC METABOLIC PANEL
Anion gap: 8 (ref 5–15)
BUN: 39 mg/dL — AB (ref 6–20)
CALCIUM: 8.2 mg/dL — AB (ref 8.9–10.3)
CO2: 26 mmol/L (ref 22–32)
Chloride: 104 mmol/L (ref 101–111)
Creatinine, Ser: 0.93 mg/dL (ref 0.44–1.00)
GFR calc Af Amer: 59 mL/min — ABNORMAL LOW (ref >60)
GFR, EST NON AFRICAN AMERICAN: 51 mL/min — AB (ref >60)
GLUCOSE: 130 mg/dL — AB (ref 65–99)
Potassium: 3.8 mmol/L (ref 3.5–5.1)
Sodium: 138 mmol/L (ref 135–145)

## 2017-04-12 LAB — MRSA PCR SCREENING: MRSA by PCR: POSITIVE — AB

## 2017-04-12 LAB — URINALYSIS, ROUTINE W REFLEX MICROSCOPIC
Bilirubin Urine: NEGATIVE
GLUCOSE, UA: NEGATIVE mg/dL
HGB URINE DIPSTICK: NEGATIVE
Ketones, ur: NEGATIVE mg/dL
NITRITE: NEGATIVE
PH: 5 (ref 5.0–8.0)
PROTEIN: 100 mg/dL — AB
RBC / HPF: NONE SEEN RBC/hpf (ref 0–5)
SPECIFIC GRAVITY, URINE: 1.013 (ref 1.005–1.030)

## 2017-04-12 LAB — CBC
HCT: 39.6 % (ref 36.0–46.0)
Hemoglobin: 12.9 g/dL (ref 12.0–15.0)
MCH: 29.9 pg (ref 26.0–34.0)
MCHC: 32.6 g/dL (ref 30.0–36.0)
MCV: 91.7 fL (ref 78.0–100.0)
Platelets: 125 10*3/uL — ABNORMAL LOW (ref 150–400)
RBC: 4.32 MIL/uL (ref 3.87–5.11)
RDW: 16.2 % — AB (ref 11.5–15.5)
WBC: 10.4 10*3/uL (ref 4.0–10.5)

## 2017-04-12 MED ORDER — ACETAMINOPHEN 325 MG PO TABS: 650.0000 mg | ORAL_TABLET | Freq: Four times a day (QID) | ORAL | Status: DC | PRN

## 2017-04-12 MED ORDER — DILTIAZEM HCL ER COATED BEADS 180 MG PO CP24
360.0000 mg | ORAL_CAPSULE | Freq: Every day | ORAL | Status: DC
  Administered 2017-04-12 – 2017-04-14 (×3): 360 mg via ORAL
  Filled 2017-04-12 (×2): qty 1
  Filled 2017-04-12: qty 2

## 2017-04-12 MED ORDER — CHLORHEXIDINE GLUCONATE CLOTH 2 % EX PADS
6.0000 | MEDICATED_PAD | Freq: Every day | CUTANEOUS | Status: DC
  Administered 2017-04-12 – 2017-04-14 (×2): 6 via TOPICAL

## 2017-04-12 MED ORDER — VANCOMYCIN HCL IN DEXTROSE 1-5 GM/200ML-% IV SOLN
1000.0000 mg | Freq: Once | INTRAVENOUS | Status: AC
  Administered 2017-04-12: 1000 mg via INTRAVENOUS
  Filled 2017-04-12: qty 200

## 2017-04-12 MED ORDER — LEVOTHYROXINE SODIUM 75 MCG PO TABS
150.0000 ug | ORAL_TABLET | Freq: Every day | ORAL | Status: DC
  Administered 2017-04-12 – 2017-04-14 (×3): 150 ug via ORAL
  Filled 2017-04-12: qty 2
  Filled 2017-04-12: qty 6
  Filled 2017-04-12 (×2): qty 2

## 2017-04-12 MED ORDER — CEFEPIME HCL 2 G IJ SOLR
2.0000 g | Freq: Once | INTRAMUSCULAR | Status: AC
  Administered 2017-04-12: 2 g via INTRAVENOUS
  Filled 2017-04-12: qty 2

## 2017-04-12 MED ORDER — MUPIROCIN 2 % EX OINT
1.0000 "application " | TOPICAL_OINTMENT | Freq: Two times a day (BID) | CUTANEOUS | Status: DC
  Administered 2017-04-12 – 2017-04-14 (×5): 1 via NASAL
  Filled 2017-04-12: qty 22

## 2017-04-12 MED ORDER — APIXABAN 5 MG PO TABS
5.0000 mg | ORAL_TABLET | Freq: Two times a day (BID) | ORAL | Status: DC
  Administered 2017-04-12 – 2017-04-14 (×5): 5 mg via ORAL
  Filled 2017-04-12 (×5): qty 1

## 2017-04-12 MED ORDER — VANCOMYCIN HCL IN DEXTROSE 1-5 GM/200ML-% IV SOLN: 1000.0000 mg | Freq: Once | INTRAVENOUS | Status: DC

## 2017-04-12 MED ORDER — ACETAMINOPHEN 650 MG RE SUPP: 650.0000 mg | Freq: Four times a day (QID) | RECTAL | Status: DC | PRN

## 2017-04-12 MED ORDER — DILTIAZEM HCL 100 MG IV SOLR
5.0000 mg/h | INTRAVENOUS | Status: DC
  Administered 2017-04-12: 5 mg/h via INTRAVENOUS
  Filled 2017-04-12: qty 100

## 2017-04-12 MED ORDER — ONDANSETRON HCL 4 MG PO TABS: 4.0000 mg | ORAL_TABLET | Freq: Four times a day (QID) | ORAL | Status: DC | PRN

## 2017-04-12 MED ORDER — ONDANSETRON HCL 4 MG/2ML IJ SOLN: 4.0000 mg | Freq: Four times a day (QID) | INTRAMUSCULAR | Status: DC | PRN

## 2017-04-12 MED ORDER — VANCOMYCIN HCL IN DEXTROSE 750-5 MG/150ML-% IV SOLN: 750.0000 mg | INTRAVENOUS | Status: DC

## 2017-04-12 MED ORDER — IOPAMIDOL (ISOVUE-300) INJECTION 61%
INTRAVENOUS | Status: AC
  Administered 2017-04-12: 100 mL
  Filled 2017-04-12: qty 100

## 2017-04-12 MED ORDER — VANCOMYCIN HCL IN DEXTROSE 750-5 MG/150ML-% IV SOLN
750.0000 mg | INTRAVENOUS | Status: DC
  Administered 2017-04-13: 750 mg via INTRAVENOUS
  Filled 2017-04-12: qty 150

## 2017-04-12 MED ORDER — CEFTRIAXONE SODIUM 2 G IJ SOLR: 2.0000 g | Freq: Once | INTRAMUSCULAR | Status: DC

## 2017-04-12 MED ORDER — DEXTROSE 5 % IV SOLN
2.0000 g | INTRAVENOUS | Status: DC
  Administered 2017-04-13 – 2017-04-14 (×2): 2 g via INTRAVENOUS
  Filled 2017-04-12 (×2): qty 2

## 2017-04-12 NOTE — ED Notes (Signed)
Pt cleaned, linens changed, and chux pad placed under pt. PureWick catheter placed on pt after in and out catheter. Peri care performed. Mepilex dressing applied to skin breakdown on pt L buttock, per pt family, skin breakdown on buttock is chronic. Pt repositioned.

## 2017-04-12 NOTE — ED Notes (Signed)
Pt had large amount of watery stool; pt and bed cleaned and changed.  Pure Wick replaced.

## 2017-04-12 NOTE — Progress Notes (Signed)
Patient bp is low,82/60 but patient is alert oriented and asymptomatic. Paged MD. Received order to stop iv cardizem and wait 1 hour to see if bp stabilizes. If bp comes up then give po cardizem per order. Iv cardizem off and will continue to monitor.

## 2017-04-12 NOTE — Clinical Social Work Note (Signed)
Clinical Social Work Assessment  Patient Details  Name: [name] MRN: [id number] Date of Birth: 06-Jun-1920  Date of referral:  04/12/17               Reason for consult:  Discharge Planning                Permission sought to share information with:  Chartered certified accountant granted to share information::  Yes, Verbal Permission Granted  Name::        Agency::  [facility]  Relationship::     Contact Information:     Housing/Transportation Living arrangements for the past 2 months:  [place] of Information:  Patient, Medical Team Patient Interpreter Needed:  None Criminal Activity/Legal Involvement Pertinent to Current Situation/Hospitalization:  No - Comment as needed Significant Relationships:  Adult Children Lives with:  Facility Resident Do you feel safe going back to the place where you live?  Yes Need for family participation in patient care:  Yes (Comment)  Care giving concerns:  Patient is from [facility] with hospice follow up.   Social Worker assessment / plan:  CSW met with patient. No supports at bedside. CSW introduced role and explained that discharge planning would be discussed. Patient confirmed that she is from [facility] and plans to return once stable for discharge. Per documentation, patient is getting hospice services there. No further concerns. CSW encouraged patient to contact CSW as needed. CSW will continue to follow patient for support and facilitate discharge back to ALF once medically stable.  Employment status:  Retired Forensic scientist:  Other (Comment Required) ([facility]/[employer]) PT Recommendations:  Not assessed at this time Information / Referral to community resources:  Other (Comment Required) (Plan is to return to ALF)  Patient/Family's Response to care:  Patient agreeable to return to ALF. Patient's daughter supportive and involved  in patient's care. Patient appreciated social work intervention.  Patient/Family's Understanding of and Emotional Response to Diagnosis, Current Treatment, and Prognosis:  Patient has a good understanding of the reason for admission and her need to return to ALF once stable. Patient appears happy with hospital care.  Emotional Assessment Appearance:  Appears stated age Attitude/Demeanor/Rapport:  Other (Pleasant) Affect (typically observed):  Accepting, Appropriate, Calm, Pleasant Orientation:  Oriented to Self, Oriented to Place, Oriented to  Time, Oriented to Situation Alcohol / Substance use:  Never Used Psych involvement (Current and /or in the community):  No (Comment)  Discharge Needs  Concerns to be addressed:  Care Coordination Readmission within the last 30 days:  No Current discharge risk:  None Barriers to Discharge:  Continued Medical Work up   Candie Chroman, LCSW 04/12/2017, 1:01 PM

## 2017-04-12 NOTE — Consult Note (Addendum)
[facility] Nurse wound consult note Reason for Consult: Consult requested for buttocks and wound between shoulders.  Pt states home health changes this dressing Q M/W/F where she previously had an abscess drained. Wound type: full thickness wound located between posterior shoulder blades; .2X.2X.4cm; unable to visualize wound bed since it is so narrow, but it appears to be clean. Measurement: Lower buttocks with patchy areas of partial thickness skin loss; approx 3X3X.1cm, red moist wound bed with shaggy, irregular wound bed and edges.  Appearance is consistent with chronic moisture associated skin damage and shear. This is NOT a pressure injury. Dressing procedure/placement/frequency: Foam dressing to protect buttocks from further injury.  Continue present plan of care to posterior middle upper back with Iodoform packing strip and foam dressing Q M/W/F. No family members present to discuss plan of care. Please re-consult if further assistance is needed.  Thank-you,  Julien Girt MSN, [place], [place], [place], [place]

## 2017-04-12 NOTE — Progress Notes (Signed)
Patients daughter called and discussed patients old abcess site on patients back. Site is upper mid back between shoulder blades. Patient has someone come and change the site every Monday, Wednesday, and Friday at [facility]. I assessed site and put in wound consult.

## 2017-04-12 NOTE — Progress Notes (Signed)
PROGRESS NOTE    DEAN WONDER  LNL:[id number] DOB: December 07, 1919 DOA: 04/11/2017 PCP: Hulan Fess, MD     Brief Narrative:  LEON GOODNOW is a 83 y.o. female with medical history significant of chronic diastolic CHF, hypertension, atrial fibrillation, bowel obstructions in the past, presented to the hospital with a 2 day history of persistent vomiting and diarrhea. She is a resident of an assisted living facility and is followed by hospice services. When her symptoms of vomiting persisted, she was brought to the ER for further evaluation. In the ED, patient was found to be tachycardic in rapid atrial fibrillation, workup revealed right lower lobe pneumonia as well as urinary tract infection. Patient was started on broad-spectrum antibiotics, Cardizem drip and admitted to stepdown unit.  Assessment & Plan:   Active Problems:   Dehydration   Hypothyroidism   Venous insufficiency   Hyperlipidemia   Essential hypertension   HCAP (healthcare-associated pneumonia)   Chronic venous stasis dermatitis of both lower extremities   Atrial fibrillation with RVR (HCC)   Chronic diastolic CHF (congestive heart failure) (HCC)   Gastroenteritis   A Fib RVR -In setting of dehydration and acute infection -Rate controlled with IV Cardizem drip, transition back to oral Cardizem today as long as blood pressure stable  Urinary tract infection, present on admission -Urine culture pending -Continue cefepime until culture results  Nausea, vomiting, diarrhea -Patient with history of polyps junction in the past -CT abdomen and pelvis without acute abdominal etiology -Resolved  Right lobar pneumonia, HCAP -Continue IV vanco, cefepime  Chronic diastolic heart failure -Euvolemic at this time  Essential hypertension -Blood pressure low this morning, hold lisinopril  Hypothyroidism -Continue Synthroid   DVT prophylaxis: Eliquis  Code Status: DNR Family Communication: Updated daughter over  the phone today. She is agreeable to medical treatment for infections at this time.  Disposition Plan: Back to [facility]   Consultants:   None  Procedures:   None   Antimicrobials:  Anti-infectives    Start     Dose/Rate Route Frequency Ordered Stop   04/13/17 0900  vancomycin (VANCOCIN) IVPB 750 mg/150 ml premix     750 mg 150 mL/hr over 60 Minutes Intravenous Every 24 hours 04/12/17 1000     04/13/17 0600  ceFEPIme (MAXIPIME) 2 g in dextrose 5 % 50 mL IVPB     2 g 100 mL/hr over 30 Minutes Intravenous Every 24 hours 04/12/17 0508     04/13/17 0600  vancomycin (VANCOCIN) IVPB 750 mg/150 ml premix  Status:  Discontinued     750 mg 150 mL/hr over 60 Minutes Intravenous Every 24 hours 04/12/17 0508 04/12/17 1000   04/12/17 1030  vancomycin (VANCOCIN) IVPB 1000 mg/200 mL premix     1,000 mg 200 mL/hr over 60 Minutes Intravenous  Once 04/12/17 1000 04/12/17 1225   04/12/17 0345  cefTRIAXone (ROCEPHIN) 2 g in dextrose 5 % 50 mL IVPB  Status:  Discontinued     2 g 100 mL/hr over 30 Minutes Intravenous  Once 04/12/17 0336 04/12/17 0345   04/12/17 0345  ceFEPIme (MAXIPIME) 2 g in dextrose 5 % 50 mL IVPB     2 g 100 mL/hr over 30 Minutes Intravenous  Once 04/12/17 0336 04/12/17 0512   04/12/17 0345  vancomycin (VANCOCIN) IVPB 1000 mg/200 mL premix  Status:  Discontinued     1,000 mg 200 mL/hr over 60 Minutes Intravenous  Once 04/12/17 0336 04/12/17 1000       Subjective: Patient states that  she is feeling much better. She denies any further nausea, vomiting or abdominal pain. She states that she has an appetite, but mostly thirsty. She denies any chest pain or shortness of breath. Had some lower abdominal pain but that has resolved as well. Mostly in good spirits today.  Objective: Vitals:   04/12/17 0524 04/12/17 0752 04/12/17 1127 04/12/17 1200  BP: 100/65 (!) 128/51 (!) 94/58 (!) 87/41  Pulse: 86  (!) 105 76  Resp: 18  17 17   Temp:  (!) 97.2 F (36.2 C) (!) 97.3 F (36.3  C)   TempSrc:  Oral Oral   SpO2: 98% 91% 100%   Weight:      Height:        Intake/Output Summary (Last 24 hours) at 04/12/17 1248 Last data filed at 04/12/17 1127  Gross per 24 hour  Intake          1100.17 ml  Output              250 ml  Net           850.17 ml   Filed Weights   04/11/17 2217  Weight: 92.1 kg (203 lb)    Examination:  General exam: Appears calm and comfortable  Respiratory system: Clear to auscultation. Respiratory effort normal. Cardiovascular system: S1 & S2 heard, Irreg rhythm, rate 90s. No JVD, murmurs, rubs, gallops or clicks. Trace pedal edema. Gastrointestinal system: Abdomen is nondistended, soft and nontender. No organomegaly or masses felt. Normal bowel sounds heard. Central nervous system: Alert and oriented. No focal neurological deficits. Extremities: Symmetric  Psychiatry: Judgement and insight appear normal. Mood & affect appropriate.   Data Reviewed: I have personally reviewed following labs and imaging studies  CBC:  Recent Labs Lab 04/11/17 2211  WBC 7.5  HGB 15.2*  HCT 45.7  MCV 90.3  PLT 921   Basic Metabolic Panel:  Recent Labs Lab 04/11/17 2211 04/12/17 0808  NA 139 144  K 4.6 QUESTIONABLE RESULTS, RECOMMEND RECOLLECT TO VERIFY  CL 102 126*  CO2 25 15*  GLUCOSE 144* 123*  BUN 43* 22*  CREATININE 1.14* 0.36*  CALCIUM 9.5 QUESTIONABLE RESULTS, RECOMMEND RECOLLECT TO VERIFY   GFR: Estimated Creatinine Clearance: 43.5 mL/min (A) (by C-G formula based on SCr of 0.36 mg/dL (L)). Liver Function Tests:  Recent Labs Lab 04/11/17 2211 04/12/17 0808  AST 23 9*  ALT 13* 7*  ALKPHOS 70 24*  BILITOT 1.1 0.6  PROT 7.2 QUESTIONABLE RESULTS, RECOMMEND RECOLLECT TO VERIFY  ALBUMIN 4.0 1.3*    Recent Labs Lab 04/11/17 2211  LIPASE 26   No results for input(s): AMMONIA in the last 168 hours. Coagulation Profile: No results for input(s): INR, PROTIME in the last 168 hours. Cardiac Enzymes: No results for input(s):  CKTOTAL, CKMB, CKMBINDEX, TROPONINI in the last 168 hours. BNP (last 3 results)  Recent Labs  12/13/16 1457  PROBNP 1,174*   HbA1C: No results for input(s): HGBA1C in the last 72 hours. CBG: No results for input(s): GLUCAP in the last 168 hours. Lipid Profile: No results for input(s): CHOL, HDL, LDLCALC, TRIG, CHOLHDL, LDLDIRECT in the last 72 hours. Thyroid Function Tests: No results for input(s): TSH, T4TOTAL, FREET4, T3FREE, THYROIDAB in the last 72 hours. Anemia Panel: No results for input(s): VITAMINB12, FOLATE, FERRITIN, TIBC, IRON, RETICCTPCT in the last 72 hours. Sepsis Labs: No results for input(s): PROCALCITON, LATICACIDVEN in the last 168 hours.  Recent Results (from the past 240 hour(s))  MRSA PCR Screening  Status: Abnormal   Collection Time: 04/12/17  6:03 AM  Result Value Ref Range Status   MRSA by PCR POSITIVE (A) NEGATIVE Final    Comment:        The GeneXpert MRSA Assay (FDA approved for NASAL specimens only), is one component of a comprehensive MRSA colonization surveillance program. It is not intended to diagnose MRSA infection nor to guide or monitor treatment for MRSA infections. RESULT CALLED TO, READ BACK BY AND VERIFIED WITH: H.MATTHEWS RN AT 0848 04/12/17 BY A.DAVIS        Radiology Studies: Ct Abdomen Pelvis W Contrast  Result Date: 04/12/2017 CLINICAL DATA:  Acute onset of nausea, vomiting and generalized abdominal pain. Abdominal distention. Initial encounter. EXAM: CT ABDOMEN AND PELVIS WITH CONTRAST TECHNIQUE: Multidetector CT imaging of the abdomen and pelvis was performed using the standard protocol following bolus administration of intravenous contrast. CONTRAST:  48mL ISOVUE-300 IOPAMIDOL (ISOVUE-300) INJECTION 61% COMPARISON:  CT of the abdomen and pelvis performed 08/25/2014 FINDINGS: Lower chest: A small pericardial effusion is noted. Scattered coronary artery calcifications are seen. Calcification is noted at the aortic valve.  Right basilar airspace opacity is compatible with pneumonia. A 2.2 cm heterogeneous nodule is noted at the right breast; this appears to have increased in size from 2016. Hepatobiliary: The nodular contour of the liver is compatible with hepatic cirrhosis. The gallbladder is unremarkable in appearance. The common bile duct remains normal in caliber. Pancreas: The pancreas is within normal limits. Spleen: The spleen is unremarkable in appearance. Adrenals/Urinary Tract: The adrenal glands are unremarkable in appearance. A small angiomyolipoma is noted at the posterior aspect of the left kidney. Small bilateral renal cysts are noted. A 2.1 cm heterogeneous nodule at the interpole region of the left kidney is grossly stable from 2016 and likely benign. A 5 mm nonobstructing stone is noted at the interpole region of the left kidney. There is no evidence of hydronephrosis. No obstructing ureteral stones are identified. Stomach/Bowel: The stomach is unremarkable in appearance. The small bowel is within normal limits. The patient is status post appendectomy. The colon is unremarkable in appearance. Vascular/Lymphatic: Diffuse calcification is seen along the abdominal aorta and its branches. The abdominal aorta is otherwise grossly unremarkable. The inferior vena cava is grossly unremarkable. No retroperitoneal lymphadenopathy is seen. No pelvic sidewall lymphadenopathy is identified. Reproductive: The bladder is mildly distended. Apparent bladder wall thickening could reflect cystitis. The patient is status post hysterectomy. No suspicious adnexal masses are seen. Other: No additional soft tissue abnormalities are seen. Musculoskeletal: No acute osseous abnormalities are identified. Facet disease is noted at the lower lumbar spine. The visualized musculature is unremarkable in appearance. IMPRESSION: 1. Right basilar pneumonia noted. 2. Apparent bladder wall thickening could reflect cystitis. Would correlate for associated  symptoms. 3. 2.2 cm heterogeneous nodule at the right breast appears to have increased in size from 2016. Malignancy cannot be excluded. Would correlate with prior mammograms, and consider diagnostic mammogram for further evaluation. 4. Small pericardial effusion. 5. Scattered coronary artery calcifications. 6. Findings of hepatic cirrhosis. 7. Small bilateral renal cysts noted. Small angiomyolipoma at the posterior aspect of the left kidney. 5 mm nonobstructing stone at the interpole region of the left kidney. 2.1 cm heterogeneous nodule at the interpole region of the left kidney is grossly stable from 2016 and likely benign. 8. Diffuse aortic atherosclerosis. Electronically Signed   By: Garald Balding M.D.   On: 04/12/2017 03:06   Dg Abd Acute W/chest  Result Date: 04/12/2017 CLINICAL DATA:  Nausea.  Abdominal distention. EXAM: DG ABDOMEN ACUTE W/ 1V CHEST COMPARISON:  09/04/2016 FINDINGS: Supine and left lateral decubitus views of the abdomen are negative for bowel obstruction or perforation. No biliary or urinary calculi are evident. The upright view of the chest demonstrates unchanged marked enlargement of the cardiac silhouette. No airspace consolidation or large effusion. IMPRESSION: Negative abdominal radiographs. Unchanged cardiomegaly. No acute findings in the chest. Electronically Signed   By: Andreas Newport M.D.   On: 04/12/2017 00:54      Scheduled Meds: . apixaban  5 mg Oral BID  . Chlorhexidine Gluconate Cloth  6 each Topical Q0600  . diltiazem  360 mg Oral Daily  . levothyroxine  150 mcg Oral QAC breakfast  . mupirocin ointment  1 application Nasal BID   Continuous Infusions: . [START ON 04/13/2017] ceFEPime (MAXIPIME) IV    . [START ON 04/13/2017] vancomycin       LOS: 0 days    Time spent: 40 minutes   Dessa Phi, DO [facility] [email or web address] Password [email or web address] 04/12/2017, 12:48 PM
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
## 2017-04-12 NOTE — H&P (Signed)
History and Physical    [name] OAC:[id number] DOB: 1920/05/05 DOA: 04/11/2017  PCP: [name] Fess, MD  [name]  I have personally briefly reviewed patient's old medical records in [place]  Chief Complaint: vomiting  HPI: [name] is a 83 y.o. female with medical history significant of chronic diastolic CHF, hypertension, atrial fibrillation, bowel obstructions in the past, presented to the hospital with a 2 day history of persistent vomiting and diarrhea. No reported history of fever. No worsening shortness of breath. She is increasingly weak. Patient normally ambulates between a walker and a wheelchair. She is a resident of an assisted living facility and is followed by hospice services. When her symptoms of vomiting persisted, she was brought to the ER for further evaluation.  ED Course: Patient was noted to be tachycardic in rapid atrial fibrillation. CT scan indicated right lower lobe pneumonia as well as possible cystitis. Urinalysis indicated possible infection. Basic labs indicated elevated BUN/creatinine consistent with dehydration. CT of the abdomen and pelvis did not indicate any underlying bowel obstruction. The patient received intravenous antibiotics and IV fluids in the emergency room. She's been referred for admission.  Review of Systems: unable to assess due to mental status  Past Medical History:  Diagnosis Date  . Bladder cancer ([place])   . Bowel obstruction ([place]) 08/25/2014  . Breast cancer ([place])   . Cervical cancer ([place])   . Chronic diastolic heart failure ([place])    a. Echo 5/12: Mild LVH, EF 55-60%, mild AI, mild MR, severe LAE, mild RAE, PASP 31, small pericardial effusion  . Colon cancer ([place])    "polyp" (12/08/2012)  . Diastolic CHF, chronic ([place]) 11/11/2012   Class 2b-3 2 d echo 5/12 mild LVH EF 55-50%, MILD AI, MILD MR, SEVERE LAE, mild RAE, PASP 31, SMALL PERICRADIAL EFFUSION  . DM2 (diabetes mellitus, type 2) ([place])     TYPE 2  . H/O ovarian cancer 11/11/2012    MUCINOUS CYST S/P RESECTION 35 YEARS AGO  . HCAP (healthcare-associated pneumonia) 07/19/2015  . HLD (hyperlipidemia)   . Hx of bladder cancer 11/11/2012  . Hx of cervical cancer 11/11/2012  . Hypertension   . Hypothyroidism   . OSA (obstructive sleep apnea)   . Ovarian cancer ([place])   . Permanent atrial fibrillation ([facility])    a. coumadin d/c'd => Pradaxa in 05/2012  . S/P hysterectomy 11/11/2012  . SBO (small bowel obstruction) ([place])   . SBO (small bowel obstruction) ([place]) 08/25/2014  . Venous insufficiency    chronic LE edema    Past Surgical History:  Procedure Laterality Date  . APPENDECTOMY    . BLADDER SURGERY    . BREAST BIOPSY Bilateral   . BREAST LUMPECTOMY Right   . CATARACT EXTRACTION W/ INTRAOCULAR LENS  IMPLANT, BILATERAL    . EXPLORATORY LAPAROTOMY WITH ABDOMINAL MASS EXCISION     "21# ovarian tumor; benign" (12/08/2012)  . I&D EXTREMITY Right 12/31/2012   Procedure: IRRIGATION AND DEBRIDEMENT RIGHT KNEE ULCER WITH PLACEMENT OF A CELL AND VAC ;  Surgeon: Theodoro Kos, DO;  Location: [facility];  Service: Plastics;  Laterality: Right;  . MASTECTOMY, RADICAL Left   . VAGINAL HYSTERECTOMY       reports that she quit smoking about 50 years ago. Her smoking use included Cigarettes. She has a 75.00 pack-year smoking history. She has never used smokeless tobacco. She reports that she does not drink alcohol or use drugs.  Allergies  Allergen Reactions  .  Morphine And Related Other (See Comments)    "Sick"  . Statins Other (See Comments)    "Sick," muscle aches, and leg swelling  . Zetia [Ezetimibe] Other (See Comments)    Side effect too strong   . 5-Alpha Reductase Inhibitors Other (See Comments)    Listed on MAR as an "allergy"  . Atorvastatin Nausea Only, Swelling and Other (See Comments)    Leg swelling also and muscle aches  . Crestor [Rosuvastatin Calcium]     Myalgia   . Metoprolol Succinate [Metoprolol]     Bradycardia    . Morphine     Vomiting (intolerance)  . Nsaids Other (See Comments)    Not noted on the patient's MAR, but listed with "allergies"  . Codeine Nausea And Vomiting    Family History  Problem Relation Age of Onset  . Hypertension Other      Prior to Admission medications   Medication Sig Start Date End Date Taking? Authorizing Provider  acetaminophen (TYLENOL) 325 MG tablet Take 650 mg by mouth every 6 (six) hours as needed (for pain).   Yes [provider]  bisacodyl (DULCOLAX) 10 MG suppository Place 10 mg rectally daily as needed (for constipation).    Yes [provider]  bisacodyl (DULCOLAX) 5 MG EC tablet Take 5 mg by mouth daily.    Yes [provider]  clobetasol cream (TEMOVATE) 0.45 % Apply 1 application topically daily as needed (rash).   Yes [provider]  cyanocobalamin (,VITAMIN B-12,) 1000 MCG/ML injection Inject 1,000 mcg into the muscle every 30 (thirty) days. GIVEN ON THE LAST DAY OF EACH MONTH   Yes [provider]  diltiazem (CARDIZEM CD) 120 MG 24 hr capsule Take 3 capsules (360 mg total) by mouth daily. 09/06/16  Yes Rama, Venetia Maxon, MD  ferrous sulfate 325 (65 FE) MG tablet Take 325 mg by mouth daily with breakfast.   Yes [provider]  furosemide (LASIX) 40 MG tablet Take 20-60 mg by mouth See admin instructions. 60 mg two times a day SCHEDULED and an additional 20 mg once a day AS NEEDED for a weight gain of 2 pounds or greater in a period of 24 hours   Yes [provider]  levothyroxine (SYNTHROID, LEVOTHROID) 150 MCG tablet Take 150 mcg by mouth daily before breakfast.   Yes [provider]  lisinopril (PRINIVIL,ZESTRIL) 5 MG tablet Take 5 mg by mouth daily.   Yes [provider]  Neomycin-Bacitracin-Polymyxin (TRIPLE ANTIBIOTIC) 3.5-6307880228 OINT Apply 1 application topically See admin instructions. APPLY TO BILATERAL LEGS IN THE MORNING   Yes [provider]  OXYGEN  Inhale 2 L into the lungs See admin instructions. AS NEEDED WHEN AMBULATING OR WHEN EXERTED   Yes [provider]  potassium chloride SA (K-DUR,KLOR-CON) 20 MEQ tablet Take 1 tablet (20 mEq total) by mouth 2 (two) times daily. 09/06/16  Yes Rama, Venetia Maxon, MD  senna-docusate (SENOKOT-S) 8.6-50 MG per tablet Take 1 tablet by mouth 2 (two) times daily. 08/31/14  Yes Vann, Jessica U, DO  sodium phosphate (FLEET) 7-19 GM/118ML ENEM Place 133 mLs (1 enema total) rectally daily as needed for severe constipation. 08/31/14  Yes Geradine Girt, DO  Zinc Oxide (DESITIN) 40 % PSTE Apply 1 application topically 3 (three) times daily. APPLY TO BUTTOCKS   Yes [provider]  apixaban (ELIQUIS) 5 MG TABS tablet Take 1 tablet (5 mg total) by mouth 2 (two) times daily. Patient not taking: Reported on  04/11/2017 01/18/14   Thompson Grayer, MD  UNABLE TO FIND Oxygen at 2 liters nasal cannula 09/06/16   Rama, Venetia Maxon, MD    Physical Exam: Vitals:   04/11/17 2230 04/12/17 0100 04/12/17 0230 04/12/17 0422  BP:  112/64 (!) 109/92 109/79  Pulse: (!) 118 61 65 (!) 109  Resp: 18 19 18 20   Temp: 98.1 F (36.7 C)     TempSrc: Oral     SpO2: 96% 97% 95% 97%  Weight:      Height:        Constitutional: NAD, calm, comfortable Vitals:   04/11/17 2230 04/12/17 0100 04/12/17 0230 04/12/17 0422  BP:  112/64 (!) 109/92 109/79  Pulse: (!) 118 61 65 (!) 109  Resp: 18 19 18 20   Temp: 98.1 F (36.7 C)     TempSrc: Oral     SpO2: 96% 97% 95% 97%  Weight:      Height:       Eyes: PERRL, lids and conjunctivae normal ENMT: Mucous membranes are dry. Posterior pharynx clear of any exudate or lesions.Normal dentition.  Neck: normal, supple, no masses, no thyromegaly Respiratory: clear to auscultation bilaterally, no wheezing, no crackles. Normal respiratory effort. No accessory muscle use.  Cardiovascular: irregular. Chronic 1-2+ extremity edema. 2+ pedal pulses. No carotid bruits.  Abdomen: no  tenderness, no masses palpated. No hepatosplenomegaly. Bowel sounds positive.  Musculoskeletal: no clubbing / cyanosis. No joint deformity upper and lower extremities. Good ROM, no contractures. Normal muscle tone.  Skin: venous stasis changes in LE bilaterally Neurologic: CN 2-12 grossly intact. Sensation intact, DTR normal. Strength 5/5 in all 4.  Psychiatric: somnolent   Labs on Admission: I have personally reviewed following labs and imaging studies  CBC:  Recent Labs Lab 04/11/17 2211  WBC 7.5  HGB 15.2*  HCT 45.7  MCV 90.3  PLT 989   Basic Metabolic Panel:  Recent Labs Lab 04/11/17 2211  NA 139  K 4.6  CL 102  CO2 25  GLUCOSE 144*  BUN 43*  CREATININE 1.14*  CALCIUM 9.5   GFR: Estimated Creatinine Clearance: 30.5 mL/min (A) (by C-G formula based on SCr of 1.14 mg/dL (H)). Liver Function Tests:  Recent Labs Lab 04/11/17 2211  AST 23  ALT 13*  ALKPHOS 70  BILITOT 1.1  PROT 7.2  ALBUMIN 4.0    Recent Labs Lab 04/11/17 2211  LIPASE 26   No results for input(s): AMMONIA in the last 168 hours. Coagulation Profile: No results for input(s): INR, PROTIME in the last 168 hours. Cardiac Enzymes: No results for input(s): CKTOTAL, CKMB, CKMBINDEX, TROPONINI in the last 168 hours. BNP (last 3 results)  Recent Labs  12/13/16 1457  PROBNP 1,174*   HbA1C: No results for input(s): HGBA1C in the last 72 hours. CBG: No results for input(s): GLUCAP in the last 168 hours. Lipid Profile: No results for input(s): CHOL, HDL, LDLCALC, TRIG, CHOLHDL, LDLDIRECT in the last 72 hours. Thyroid Function Tests: No results for input(s): TSH, T4TOTAL, FREET4, T3FREE, THYROIDAB in the last 72 hours. Anemia Panel: No results for input(s): VITAMINB12, FOLATE, FERRITIN, TIBC, IRON, RETICCTPCT in the last 72 hours. Urine analysis:    Component Value Date/Time   COLORURINE AMBER (A) 04/12/2017 0136   APPEARANCEUR TURBID (A) 04/12/2017 0136   LABSPEC 1.013 04/12/2017 0136    PHURINE 5.0 04/12/2017 0136   GLUCOSEU NEGATIVE 04/12/2017 0136   HGBUR NEGATIVE 04/12/2017 0136   BILIRUBINUR NEGATIVE 04/12/2017 0136   KETONESUR NEGATIVE 04/12/2017 0136  PROTEINUR 100 (A) 04/12/2017 0136   UROBILINOGEN 0.2 08/25/2014 1246   NITRITE NEGATIVE 04/12/2017 0136   LEUKOCYTESUR LARGE (A) 04/12/2017 0136    Radiological Exams on Admission: Ct Abdomen Pelvis W Contrast  Result Date: 04/12/2017 CLINICAL DATA:  Acute onset of nausea, vomiting and generalized abdominal pain. Abdominal distention. Initial encounter. EXAM: CT ABDOMEN AND PELVIS WITH CONTRAST TECHNIQUE: Multidetector CT imaging of the abdomen and pelvis was performed using the standard protocol following bolus administration of intravenous contrast. CONTRAST:  8mL ISOVUE-300 IOPAMIDOL (ISOVUE-300) INJECTION 61% COMPARISON:  CT of the abdomen and pelvis performed 08/25/2014 FINDINGS: Lower chest: A small pericardial effusion is noted. Scattered coronary artery calcifications are seen. Calcification is noted at the aortic valve. Right basilar airspace opacity is compatible with pneumonia. A 2.2 cm heterogeneous nodule is noted at the right breast; this appears to have increased in size from 2016. Hepatobiliary: The nodular contour of the liver is compatible with hepatic cirrhosis. The gallbladder is unremarkable in appearance. The common bile duct remains normal in caliber. Pancreas: The pancreas is within normal limits. Spleen: The spleen is unremarkable in appearance. Adrenals/Urinary Tract: The adrenal glands are unremarkable in appearance. A small angiomyolipoma is noted at the posterior aspect of the left kidney. Small bilateral renal cysts are noted. A 2.1 cm heterogeneous nodule at the interpole region of the left kidney is grossly stable from 2016 and likely benign. A 5 mm nonobstructing stone is noted at the interpole region of the left kidney. There is no evidence of hydronephrosis. No obstructing ureteral stones  are identified. Stomach/Bowel: The stomach is unremarkable in appearance. The small bowel is within normal limits. The patient is status post appendectomy. The colon is unremarkable in appearance. Vascular/Lymphatic: Diffuse calcification is seen along the abdominal aorta and its branches. The abdominal aorta is otherwise grossly unremarkable. The inferior vena cava is grossly unremarkable. No retroperitoneal lymphadenopathy is seen. No pelvic sidewall lymphadenopathy is identified. Reproductive: The bladder is mildly distended. Apparent bladder wall thickening could reflect cystitis. The patient is status post hysterectomy. No suspicious adnexal masses are seen. Other: No additional soft tissue abnormalities are seen. Musculoskeletal: No acute osseous abnormalities are identified. Facet disease is noted at the lower lumbar spine. The visualized musculature is unremarkable in appearance. IMPRESSION: 1. Right basilar pneumonia noted. 2. Apparent bladder wall thickening could reflect cystitis. Would correlate for associated symptoms. 3. 2.2 cm heterogeneous nodule at the right breast appears to have increased in size from 2016. Malignancy cannot be excluded. Would correlate with prior mammograms, and consider diagnostic mammogram for further evaluation. 4. Small pericardial effusion. 5. Scattered coronary artery calcifications. 6. Findings of hepatic cirrhosis. 7. Small bilateral renal cysts noted. Small angiomyolipoma at the posterior aspect of the left kidney. 5 mm nonobstructing stone at the interpole region of the left kidney. 2.1 cm heterogeneous nodule at the interpole region of the left kidney is grossly stable from 2016 and likely benign. 8. Diffuse aortic atherosclerosis. Electronically Signed   By: Garald Balding M.D.   On: 04/12/2017 03:06   Dg Abd Acute W/chest  Result Date: 04/12/2017 CLINICAL DATA:  Nausea.  Abdominal distention. EXAM: DG ABDOMEN ACUTE W/ 1V CHEST COMPARISON:  09/04/2016 FINDINGS:  Supine and left lateral decubitus views of the abdomen are negative for bowel obstruction or perforation. No biliary or urinary calculi are evident. The upright view of the chest demonstrates unchanged marked enlargement of the cardiac silhouette. No airspace consolidation or large effusion. IMPRESSION: Negative abdominal radiographs. Unchanged cardiomegaly. No acute  findings in the chest. Electronically Signed   By: Andreas Newport M.D.   On: 04/12/2017 00:54    EKG: not ordered in ED. Ordered on admission  Assessment/Plan Active Problems:   Dehydration   Hypothyroidism   Venous insufficiency   Hyperlipidemia   Essential hypertension   HCAP (healthcare-associated pneumonia)   Chronic venous stasis dermatitis of both lower extremities   Atrial fibrillation with RVR (HCC)   Chronic diastolic CHF (congestive heart failure) (HCC)   Gastroenteritis    1. Atrial fibrillation with rapid original response. Likely precipitated by underlying dehydration and infection. The patient is somewhat somnolent and staff does not feel the patient can tolerate by mouth meds at this time. Start on Cardizem infusion. We'll also continue oral medications. When she is able to take this, will hopefully wean off Cardizem infusion. She is anticoagulated with apixaban 2. Vomiting and diarrhea. No evidence of bowel obstruction on CT scan. Suspect gastroenteritis. Treat supportively. 3. Possible urinary tract infection. Urine culture has been sent. She is on IV antibiotics. 4. Pneumonia. Treat for healthcare associated pneumonia with IV antibiotics. De-escalate to orals as her clinical condition improves. 5. Chronic diastolic CHF. Appears compensated at this time. Hold off on further Lasix and she appears dehydrated. 6. Hypertension. Blood pressures are running. Hold lisinopril. Continue diltiazem for heart rate control. 7. Chronic venous stasis lower extremities. Appears to be at baseline. 8. Hypothyroidism.  Continue on Synthroid 9. Goals of care. Since patient is followed by hospice services, Palliative care consult may be beneficial to readdress goals of care with patient and her daughter.  DVT prophylaxis: apixaban Code Status: DNR Family Communication: discussed with daughter over the phone Disposition Plan: discharge back to [facility] with hospice Consults called:  Admission status: stepdown, inpatient   [facility] MD [facility] Pager [phone]  If 7PM-7AM, please contact night-coverage [email or web address] Password [email or web address]  04/12/2017, 4:29 AM

## 2017-04-12 NOTE — Progress Notes (Signed)
Admitted to Room 15 Heart rate afib, 120;s alert and oriented, asking for water. Drinking water and tolerating well. Denies abdominal pain, nausea.  No c/o pain or chest pain.

## 2017-04-12 NOTE — Progress Notes (Signed)
Pharmacy Antibiotic Note  [name] is a 83 y.o. female admitted on 04/11/2017 with pneumonia and UTI.  Pharmacy has been consulted for Vancomycin/Cefepime dosing. WBC WNL. CrCl ~30.   Plan: Vancomycin 750 mg IV q24h Cefepime 2g IV q24h Trend WBC, temp, renal function  F/U infectious work-up Drug levels as indicated   Height: 5\' 1"  (154.9 cm) Weight: 203 lb (92.1 kg) IBW/kg (Calculated) : 47.8  Temp (24hrs), Avg:98.1 F (36.7 C), Min:98.1 F (36.7 C), Max:98.1 F (36.7 C)   Recent Labs Lab 04/11/17 2211  WBC 7.5  CREATININE 1.14*    Estimated Creatinine Clearance: 30.5 mL/min (A) (by C-G formula based on SCr of 1.14 mg/dL (H)).    Allergies  Allergen Reactions  . Morphine And Related Other (See Comments)    "Sick"  . Statins Other (See Comments)    "Sick," muscle aches, and leg swelling  . Zetia [Ezetimibe] Other (See Comments)    Side effect too strong   . 5-Alpha Reductase Inhibitors Other (See Comments)    Listed on MAR as an "allergy"  . Atorvastatin Nausea Only, Swelling and Other (See Comments)    Leg swelling also and muscle aches  . Crestor [Rosuvastatin Calcium]     Myalgia   . Metoprolol Succinate [Metoprolol]     Bradycardia  . Morphine     Vomiting (intolerance)  . Nsaids Other (See Comments)    Not noted on the patient's MAR, but listed with "allergies"  . Codeine Nausea And Vomiting     Narda Bonds 04/12/2017 5:06 AM

## 2017-04-12 NOTE — Progress Notes (Signed)
Palliative Medicine RN Note: Discussed pt in team rounds and spoke with [facility]. Pt is active with them, and this is a GIP admission. For GIP patients, goals are set during conversations between [facility] and patients, so there is not a place for PMT to engage in those discussions. Thus, PMT will not see this patient. Please reconsult or call us if new needs arise not related to goals, and we will be happy to assist as appropriate.  Marjie Skiff Telvin Reinders, RN, BSN, [facility] 04/12/2017 9:10 AM Cell [phone] 8:00-4:00 Monday-Friday Office [phone]

## 2017-04-13 LAB — BASIC METABOLIC PANEL
Anion gap: 7 (ref 5–15)
BUN: 37 mg/dL — AB (ref 6–20)
CHLORIDE: 105 mmol/L (ref 101–111)
CO2: 24 mmol/L (ref 22–32)
CREATININE: 0.86 mg/dL (ref 0.44–1.00)
Calcium: 8.1 mg/dL — ABNORMAL LOW (ref 8.9–10.3)
GFR calc Af Amer: >60 mL/min (ref >60)
GFR calc non Af Amer: 56 mL/min — ABNORMAL LOW (ref >60)
Glucose, Bld: 89 mg/dL (ref 65–99)
Potassium: 4.2 mmol/L (ref 3.5–5.1)
SODIUM: 136 mmol/L (ref 135–145)

## 2017-04-13 LAB — CBC
HCT: 38.2 % (ref 36.0–46.0)
HEMOGLOBIN: 12.1 g/dL (ref 12.0–15.0)
MCH: 29 pg (ref 26.0–34.0)
MCHC: 31.7 g/dL (ref 30.0–36.0)
MCV: 91.6 fL (ref 78.0–100.0)
PLATELETS: 133 10*3/uL — AB (ref 150–400)
RBC: 4.17 MIL/uL (ref 3.87–5.11)
RDW: 16.4 % — AB (ref 11.5–15.5)
WBC: 7.5 10*3/uL (ref 4.0–10.5)

## 2017-04-13 MED ORDER — BISACODYL 5 MG PO TBEC
5.0000 mg | DELAYED_RELEASE_TABLET | Freq: Every day | ORAL | Status: DC
  Administered 2017-04-13 – 2017-04-14 (×2): 5 mg via ORAL
  Filled 2017-04-13 (×2): qty 1

## 2017-04-13 NOTE — Progress Notes (Signed)
PROGRESS NOTE  [name] TDV:[id number] DOB: 1920-02-14 DOA: 04/11/2017 PCP: Hulan Fess, MD   LOS: 1 day   Brief Narrative / Interim history: [name] a 83 y.o.femalewith medical history significant of chronic diastolic CHF, hypertension, atrial fibrillation, bowel obstructions in the past, presented to the hospital with a 2 day history of persistent vomiting and diarrhea. She is a resident of an assisted living facility and is followed by hospice services. When her symptoms of vomiting persisted, she was brought to the ER for further evaluation. In the ED, patient was found to be tachycardic in rapid atrial fibrillation, workup revealed right lower lobe pneumonia as well as urinary tract infection. Patient was started on broad-spectrum antibiotics, Cardizem drip and admitted to stepdown unit.  Assessment & Plan: Active Problems:   Dehydration   Hypothyroidism   Venous insufficiency   Hyperlipidemia   Essential hypertension   HCAP (healthcare-associated pneumonia)   Chronic venous stasis dermatitis of both lower extremities   Atrial fibrillation with RVR (HCC)   Chronic diastolic CHF (congestive heart failure) (HCC)   Gastroenteritis   A Fib RVR -In setting of dehydration and acute infection -She is off the Cardizem infusion, back to Cardizem p.o., rate controlled  Urinary tract infection, present on admission -Urine culture pending, currently gram-negative rods -Continue cefepime until culture results, discontinue vancomycin today  Nausea, vomiting, diarrhea -Patient with history of polyps junction in the past -CT abdomen and pelvis without acute abdominal etiology -Resolved  Right lobar pneumonia, HCAP -Continue IV cefepime, discontinue vancomycin today  Chronic diastolic heart failure -Euvolemic at this time  Essential hypertension -Continue to hold lisinopril  Hypothyroidism -Continue Synthroid  DVT prophylaxis: Eliquis Code Status:  DNR Family Communication: no family at bedside Disposition Plan: D/c 1-2 days  Consultants:   Non e  Procedures:   None   Antimicrobials:  Vancomycin 10/11 >>  Cefepime 10/11 >>   Subjective: - no chest pain, shortness of breath, no abdominal pain, nausea or vomiting.  Feeling well overall  Objective: Vitals:   04/13/17 0000 04/13/17 0305 04/13/17 0400 04/13/17 0803  BP:  106/62    Pulse: 73 78 74   Resp: 20 19 19    Temp:  98.2 F (36.8 C)  (!) 97.3 F (36.3 C)  TempSrc:  Oral  Oral  SpO2:  94%    Weight:      Height:        Intake/Output Summary (Last 24 hours) at 04/13/17 1120 Last data filed at 04/13/17 0000  Gross per 24 hour  Intake           378.33 ml  Output              600 ml  Net          -221.67 ml   Filed Weights   04/11/17 2217  Weight: 92.1 kg (203 lb)    Examination:  Constitutional: NAD Eyes: lids and conjunctivae normal ENMT: Mucous membranes are moist. No oropharyngeal exudates Respiratory: clear to auscultation bilaterally, no wheezing, no crackles. Normal respiratory effort. Cardiovascular: irregular, no MRG, no edema Abdomen: no tenderness. Bowel sounds positive.  Skin: no rashes, lesions, ulcers. No induration Neurologic: nonfocal   Data Reviewed: I have independently reviewed following labs and imaging studies   CBC:  Recent Labs Lab 04/11/17 2211 04/12/17 1320 04/13/17 0224  WBC 7.5 10.4 7.5  HGB 15.2* 12.9 12.1  HCT 45.7 39.6 38.2  MCV 90.3 91.7 91.6  PLT 161 125* 133*  Basic Metabolic Panel:  Recent Labs Lab 04/11/17 2211 04/12/17 0808 04/12/17 1249 04/13/17 0224  NA 139 144 138 136  K 4.6 QUESTIONABLE RESULTS, RECOMMEND RECOLLECT TO VERIFY 3.8 4.2  CL 102 126* 104 105  CO2 25 15* 26 24  GLUCOSE 144* 123* 130* 89  BUN 43* 22* 39* 37*  CREATININE 1.14* 0.36* 0.93 0.86  CALCIUM 9.5 QUESTIONABLE RESULTS, RECOMMEND RECOLLECT TO VERIFY 8.2* 8.1*   GFR: Estimated Creatinine Clearance: 40.5 mL/min (by C-G  formula based on SCr of 0.86 mg/dL). Liver Function Tests:  Recent Labs Lab 04/11/17 2211 04/12/17 0808  AST 23 9*  ALT 13* 7*  ALKPHOS 70 24*  BILITOT 1.1 0.6  PROT 7.2 QUESTIONABLE RESULTS, RECOMMEND RECOLLECT TO VERIFY  ALBUMIN 4.0 1.3*    Recent Labs Lab 04/11/17 2211  LIPASE 26   No results for input(s): AMMONIA in the last 168 hours. Coagulation Profile: No results for input(s): INR, PROTIME in the last 168 hours. Cardiac Enzymes: No results for input(s): CKTOTAL, CKMB, CKMBINDEX, TROPONINI in the last 168 hours. BNP (last 3 results)  Recent Labs  12/13/16 1457  PROBNP 1,174*   HbA1C: No results for input(s): HGBA1C in the last 72 hours. CBG: No results for input(s): GLUCAP in the last 168 hours. Lipid Profile: No results for input(s): CHOL, HDL, LDLCALC, TRIG, CHOLHDL, LDLDIRECT in the last 72 hours. Thyroid Function Tests: No results for input(s): TSH, T4TOTAL, FREET4, T3FREE, THYROIDAB in the last 72 hours. Anemia Panel: No results for input(s): VITAMINB12, FOLATE, FERRITIN, TIBC, IRON, RETICCTPCT in the last 72 hours. Urine analysis:    Component Value Date/Time   COLORURINE AMBER (A) 04/12/2017 0136   APPEARANCEUR TURBID (A) 04/12/2017 0136   LABSPEC 1.013 04/12/2017 0136   PHURINE 5.0 04/12/2017 0136   GLUCOSEU NEGATIVE 04/12/2017 0136   HGBUR NEGATIVE 04/12/2017 0136   BILIRUBINUR NEGATIVE 04/12/2017 0136   KETONESUR NEGATIVE 04/12/2017 0136   PROTEINUR 100 (A) 04/12/2017 0136   UROBILINOGEN 0.2 08/25/2014 1246   NITRITE NEGATIVE 04/12/2017 0136   LEUKOCYTESUR LARGE (A) 04/12/2017 0136   Sepsis Labs: Invalid input(s): PROCALCITONIN, LACTICIDVEN  Recent Results (from the past 240 hour(s))  Urine culture     Status: Abnormal (Preliminary result)   Collection Time: 04/12/17  1:36 AM  Result Value Ref Range Status   Specimen Description URINE, CATHETERIZED  Final   Special Requests NONE  Final   Culture >=100,000 COLONIES/mL GRAM NEGATIVE  RODS (A)  Final   Report Status PENDING  Incomplete  MRSA PCR Screening     Status: Abnormal   Collection Time: 04/12/17  6:03 AM  Result Value Ref Range Status   MRSA by PCR POSITIVE (A) NEGATIVE Final    Comment:        The GeneXpert MRSA Assay (FDA approved for NASAL specimens only), is one component of a comprehensive MRSA colonization surveillance program. It is not intended to diagnose MRSA infection nor to guide or monitor treatment for MRSA infections. RESULT CALLED TO, READ BACK BY AND VERIFIED WITH: H.MATTHEWS RN AT 0848 04/12/17 BY A.DAVIS       Radiology Studies: Ct Abdomen Pelvis W Contrast  Result Date: 04/12/2017 CLINICAL DATA:  Acute onset of nausea, vomiting and generalized abdominal pain. Abdominal distention. Initial encounter. EXAM: CT ABDOMEN AND PELVIS WITH CONTRAST TECHNIQUE: Multidetector CT imaging of the abdomen and pelvis was performed using the standard protocol following bolus administration of intravenous contrast. CONTRAST:  52mL ISOVUE-300 IOPAMIDOL (ISOVUE-300) INJECTION 61% COMPARISON:  CT of the  abdomen and pelvis performed 08/25/2014 FINDINGS: Lower chest: A small pericardial effusion is noted. Scattered coronary artery calcifications are seen. Calcification is noted at the aortic valve. Right basilar airspace opacity is compatible with pneumonia. A 2.2 cm heterogeneous nodule is noted at the right breast; this appears to have increased in size from 2016. Hepatobiliary: The nodular contour of the liver is compatible with hepatic cirrhosis. The gallbladder is unremarkable in appearance. The common bile duct remains normal in caliber. Pancreas: The pancreas is within normal limits. Spleen: The spleen is unremarkable in appearance. Adrenals/Urinary Tract: The adrenal glands are unremarkable in appearance. A small angiomyolipoma is noted at the posterior aspect of the left kidney. Small bilateral renal cysts are noted. A 2.1 cm heterogeneous nodule at the  interpole region of the left kidney is grossly stable from 2016 and likely benign. A 5 mm nonobstructing stone is noted at the interpole region of the left kidney. There is no evidence of hydronephrosis. No obstructing ureteral stones are identified. Stomach/Bowel: The stomach is unremarkable in appearance. The small bowel is within normal limits. The patient is status post appendectomy. The colon is unremarkable in appearance. Vascular/Lymphatic: Diffuse calcification is seen along the abdominal aorta and its branches. The abdominal aorta is otherwise grossly unremarkable. The inferior vena cava is grossly unremarkable. No retroperitoneal lymphadenopathy is seen. No pelvic sidewall lymphadenopathy is identified. Reproductive: The bladder is mildly distended. Apparent bladder wall thickening could reflect cystitis. The patient is status post hysterectomy. No suspicious adnexal masses are seen. Other: No additional soft tissue abnormalities are seen. Musculoskeletal: No acute osseous abnormalities are identified. Facet disease is noted at the lower lumbar spine. The visualized musculature is unremarkable in appearance. IMPRESSION: 1. Right basilar pneumonia noted. 2. Apparent bladder wall thickening could reflect cystitis. Would correlate for associated symptoms. 3. 2.2 cm heterogeneous nodule at the right breast appears to have increased in size from 2016. Malignancy cannot be excluded. Would correlate with prior mammograms, and consider diagnostic mammogram for further evaluation. 4. Small pericardial effusion. 5. Scattered coronary artery calcifications. 6. Findings of hepatic cirrhosis. 7. Small bilateral renal cysts noted. Small angiomyolipoma at the posterior aspect of the left kidney. 5 mm nonobstructing stone at the interpole region of the left kidney. 2.1 cm heterogeneous nodule at the interpole region of the left kidney is grossly stable from 2016 and likely benign. 8. Diffuse aortic atherosclerosis.  Electronically Signed   By: Garald Balding M.D.   On: 04/12/2017 03:06   Dg Abd Acute W/chest  Result Date: 04/12/2017 CLINICAL DATA:  Nausea.  Abdominal distention. EXAM: DG ABDOMEN ACUTE W/ 1V CHEST COMPARISON:  09/04/2016 FINDINGS: Supine and left lateral decubitus views of the abdomen are negative for bowel obstruction or perforation. No biliary or urinary calculi are evident. The upright view of the chest demonstrates unchanged marked enlargement of the cardiac silhouette. No airspace consolidation or large effusion. IMPRESSION: Negative abdominal radiographs. Unchanged cardiomegaly. No acute findings in the chest. Electronically Signed   By: Andreas Newport M.D.   On: 04/12/2017 00:54     Scheduled Meds: . apixaban  5 mg Oral BID  . Chlorhexidine Gluconate Cloth  6 each Topical Q0600  . diltiazem  360 mg Oral Daily  . levothyroxine  150 mcg Oral QAC breakfast  . mupirocin ointment  1 application Nasal BID   Continuous Infusions: . ceFEPime (MAXIPIME) IV Stopped (04/13/17 0724)  . vancomycin 750 mg (04/13/17 6967)    Marzetta Board, MD, PhD [facility] Pager [phone]  If 7PM-7AM, please contact night-coverage [email or web address] Password [email or web address] 04/13/2017, 11:20 AM

## 2017-04-13 NOTE — Evaluation (Addendum)
Physical Therapy Evaluation Patient Details Name: [name] MRN: [id number] DOB: 03-Mar-1920 Today's Date: 04/13/2017   History of Present Illness  Pt adm with dehydration. Pt hospice pt at ALF. PMH - chf, afib, breast CA, bladder CA, overian CA, dm, htn, venous stasis  Clinical Impression  Pt presents to PT dependent with mobility. Pt is hospice pt at ALF. Pt will need total bed level care or possibly OOB with lift at ALF. No further PT recommended.    Follow Up Recommendations No PT follow up    Equipment Recommendations  None recommended by PT    Recommendations for Other Services       Precautions / Restrictions Precautions Precautions: Fall Restrictions Weight Bearing Restrictions: No      Mobility  Bed Mobility Overal bed mobility: Needs Assistance Bed Mobility: Supine to Sit;Sit to Supine;Rolling Rolling: Total assist   Supine to sit: +2 for physical assistance;Total assist Sit to supine: +2 for physical assistance;Total assist   General bed mobility comments: Assist for all aspect  Transfers                    Ambulation/Gait                Stairs            Wheelchair Mobility    Modified Rankin (Stroke Patients Only)       Balance Overall balance assessment: Needs assistance Sitting-balance support: Bilateral upper extremity supported;Feet supported Sitting balance-Leahy Scale: Zero Sitting balance - Comments: Pt required max assist to maintain sitting EOB for 5 minutes Postural control: Right lateral lean;Posterior lean                                   Pertinent Vitals/Pain Pain Assessment: No/denies pain    Home Living Family/patient expects to be discharged to:: Assisted living               Home Equipment: Walker - 2 wheels;Shower seat Additional Comments: equipment per old chart    Prior Function Level of Independence: Needs assistance   Gait / Transfers Assistance Needed: Pt reports  she uses walker but doubt this is accurate for recent mobility.           Hand Dominance   Dominant Hand: Right    Extremity/Trunk Assessment   Upper Extremity Assessment Upper Extremity Assessment: Generalized weakness    Lower Extremity Assessment Lower Extremity Assessment: RLE deficits/detail;LLE deficits/detail RLE Deficits / Details: Strength <3/5 LLE Deficits / Details: Strength <3/5       Communication   Communication: No difficulties  Cognition Arousal/Alertness: Awake/alert Behavior During Therapy: WFL for tasks assessed/performed Overall Cognitive Status: No family/caregiver present to determine baseline cognitive functioning                                 General Comments: unsure of accuracy of pt relative to functional level      General Comments      Exercises     Assessment/Plan    PT Assessment Patent does not need any further PT services  PT Problem List         PT Treatment Interventions      PT Goals (Current goals can be found in the Care Plan section)  Acute Rehab PT Goals PT Goal Formulation: All assessment and education complete,  DC therapy    Frequency     Barriers to discharge        Co-evaluation               AM-PAC PT "6 Clicks" Daily Activity  Outcome Measure Difficulty turning over in bed (including adjusting bedclothes, sheets and blankets)?: Unable Difficulty moving from lying on back to sitting on the side of the bed? : Unable Difficulty sitting down on and standing up from a chair with arms (e.g., wheelchair, bedside commode, etc,.)?: Unable Help needed moving to and from a bed to chair (including a wheelchair)?: Total Help needed walking in hospital room?: Total Help needed climbing 3-5 steps with a railing? : Total 6 Click Score: 6    End of Session   Activity Tolerance: Patient tolerated treatment well Patient left: in bed;with call bell/phone within reach;with nursing/sitter in  room Nurse Communication: Mobility status (pt present for mobility) PT Visit Diagnosis: Other abnormalities of gait and mobility (R26.89)    Time: 3953-2023 PT Time Calculation (min) (ACUTE ONLY): 15 min   Charges:   PT Evaluation $PT Eval Low Complexity: 1 Low     PT G CodesMarland Kitchen        [facility] PT [place] 04/13/2017, 6:04 PM
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
## 2017-04-14 LAB — CBC
HCT: 39 % (ref 36.0–46.0)
HEMOGLOBIN: 13 g/dL (ref 12.0–15.0)
MCH: 30.4 pg (ref 26.0–34.0)
MCHC: 33.3 g/dL (ref 30.0–36.0)
MCV: 91.3 fL (ref 78.0–100.0)
PLATELETS: 119 10*3/uL — AB (ref 150–400)
RBC: 4.27 MIL/uL (ref 3.87–5.11)
RDW: 16.1 % — AB (ref 11.5–15.5)
WBC: 7.1 10*3/uL (ref 4.0–10.5)

## 2017-04-14 LAB — BASIC METABOLIC PANEL
Anion gap: 6 (ref 5–15)
BUN: 25 mg/dL — AB (ref 6–20)
CALCIUM: 8.1 mg/dL — AB (ref 8.9–10.3)
CHLORIDE: 105 mmol/L (ref 101–111)
CO2: 27 mmol/L (ref 22–32)
CREATININE: 0.69 mg/dL (ref 0.44–1.00)
GFR calc non Af Amer: >60 mL/min (ref >60)
Glucose, Bld: 107 mg/dL — ABNORMAL HIGH (ref 65–99)
Potassium: 3.8 mmol/L (ref 3.5–5.1)
SODIUM: 138 mmol/L (ref 135–145)

## 2017-04-14 LAB — URINE CULTURE

## 2017-04-14 MED ORDER — CEPHALEXIN 500 MG PO CAPS: 500.0000 mg | ORAL_CAPSULE | Freq: Four times a day (QID) | ORAL | 0 refills | Status: AC

## 2017-04-14 NOTE — Clinical Social Work Note (Signed)
CSW notified by MD pt is ready to DC back to ALF. CSW contacted ALF, spoke to [place], ok for pt to come back today.  Clinical Social Worker facilitated patient discharge including contacting patient family and facility to confirm patient discharge plans.  Clinical information faxed to facility and family agreeable with plan. CSW arranged ambulance transport via [facility] to [facility]. RN to call report prior to discharge.  Clinical Social Worker will sign off for now as social work intervention is no longer needed. Please consult [place] again if new need arises.  Christropher Gintz B. Joline Maxcy Clinical Social Work Dept Weekend Social Worker [phone] 10:41 AM
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
## 2017-04-14 NOTE — Discharge Summary (Signed)
Physician Discharge Summary  [name] JKD:[id number] DOB: 1919/08/14 DOA: 04/11/2017  PCP: Hulan Fess, MD  Admit date: 04/11/2017 Discharge date: 04/14/2017  Admitted From: ALF Disposition:  ALF  Recommendations for Outpatient Follow-up:  1. Follow up with PCP in 1-2 weeks 2. Continue Keflex for 7 days  Home Health: none Equipment/Devices: stable  Discharge Condition: stable CODE STATUS: DNR Diet recommendation: regular  HPI: Per Dr. Reino Bellis [name] is a 83 y.o. female with medical history significant of chronic diastolic CHF, hypertension, atrial fibrillation, bowel obstructions in the past, presented to the hospital with a 2 day history of persistent vomiting and diarrhea. No reported history of fever. No worsening shortness of breath. She is increasingly weak. Patient normally ambulates between a walker and a wheelchair. She is a resident of an assisted living facility and is followed by hospice services. When her symptoms of vomiting persisted, she was brought to the ER for further evaluation. ED Course: Patient was noted to be tachycardic in rapid atrial fibrillation. CT scan indicated right lower lobe pneumonia as well as possible cystitis. Urinalysis indicated possible infection. Basic labs indicated elevated BUN/creatinine consistent with dehydration. CT of the abdomen and pelvis did not indicate any underlying bowel obstruction. The patient received intravenous antibiotics and IV fluids in the emergency room. She's been referred for admission.  Hospital Course: Discharge Diagnoses:  Active Problems:   Dehydration   Hypothyroidism   Venous insufficiency   Hyperlipidemia   Essential hypertension   HCAP (healthcare-associated pneumonia)   Chronic venous stasis dermatitis of both lower extremities   Atrial fibrillation with RVR (HCC)   Chronic diastolic CHF (congestive heart failure) (HCC)   Gastroenteritis    A Fib RVR -In setting of dehydration and  acute infection with a UTI, she was admitted to stepdown and placed on Cardizem infusion with improvement in her rates. She is off the Cardizem infusion for 2 days now, she is rate controlled with oral Cardizem Urinary tract infection, present on admission -Urine cultures with E coli, place on Keflex based on sensitivities, continue for 7 more days Nausea, vomiting, diarrhea -due to UTI, CT abdomen and pelvis without acute abdominal findings. Resolved Right lobar pneumonia, HCAP -no respiratory symptoms, less likely pneumonia Chronic diastolic heart failure -Euvolemic at this time Essential hypertension -resume home medications Hypothyroidism -Continue Synthroid   Discharge Instructions   Allergies as of 04/14/2017      Reactions   Morphine And Related Other (See Comments)   "Sick"   Statins Other (See Comments)   "Sick," muscle aches, and leg swelling   Zetia [ezetimibe] Other (See Comments)   Side effect too strong    5-alpha Reductase Inhibitors Other (See Comments)   Listed on MAR as an "allergy"   Atorvastatin Nausea Only, Swelling, Other (See Comments)   Leg swelling also and muscle aches   Crestor [rosuvastatin Calcium]    Myalgia    Metoprolol Succinate [metoprolol]    Bradycardia   Morphine    Vomiting (intolerance)   Nsaids Other (See Comments)   Not noted on the patient's MAR, but listed with "allergies"   Codeine Nausea And Vomiting      Medication List    TAKE these medications   acetaminophen 325 MG tablet Commonly known as:  TYLENOL Take 650 mg by mouth every 6 (six) hours as needed (for pain).   apixaban 5 MG Tabs tablet Commonly known as:  ELIQUIS Take 1 tablet (5 mg total) by mouth 2 (two)  times daily.   bisacodyl 10 MG suppository Commonly known as:  DULCOLAX Place 10 mg rectally daily as needed (for constipation).   bisacodyl 5 MG EC tablet Commonly known as:  DULCOLAX Take 5 mg by mouth daily.   cephALEXin 500 MG capsule Commonly known as:   KEFLEX Take 1 capsule (500 mg total) by mouth 4 (four) times daily.   clobetasol cream 0.05 % Commonly known as:  TEMOVATE Apply 1 application topically daily as needed (rash).   cyanocobalamin 1000 MCG/ML injection Commonly known as:  (VITAMIN B-12) Inject 1,000 mcg into the muscle every 30 (thirty) days. GIVEN ON THE LAST DAY OF EACH MONTH   DESITIN 40 % Pste Generic drug:  Zinc Oxide Apply 1 application topically 3 (three) times daily. APPLY TO BUTTOCKS   diltiazem 120 MG 24 hr capsule Commonly known as:  CARDIZEM CD Take 3 capsules (360 mg total) by mouth daily.   ferrous sulfate 325 (65 FE) MG tablet Take 325 mg by mouth daily with breakfast.   furosemide 40 MG tablet Commonly known as:  LASIX Take 20-60 mg by mouth See admin instructions. 60 mg two times a day SCHEDULED and an additional 20 mg once a day AS NEEDED for a weight gain of 2 pounds or greater in a period of 24 hours   levothyroxine 150 MCG tablet Commonly known as:  SYNTHROID, LEVOTHROID Take 150 mcg by mouth daily before breakfast.   lisinopril 5 MG tablet Commonly known as:  PRINIVIL,ZESTRIL Take 5 mg by mouth daily.   OXYGEN Inhale 2 L into the lungs See admin instructions. AS NEEDED WHEN AMBULATING OR WHEN EXERTED   potassium chloride SA 20 MEQ tablet Commonly known as:  K-DUR,KLOR-CON Take 1 tablet (20 mEq total) by mouth 2 (two) times daily.   senna-docusate 8.6-50 MG tablet Commonly known as:  Senokot-S Take 1 tablet by mouth 2 (two) times daily.   sodium phosphate 7-19 GM/118ML Enem Place 133 mLs (1 enema total) rectally daily as needed for severe constipation.   TRIPLE ANTIBIOTIC [phone] Oint Apply 1 application topically See admin instructions. APPLY TO BILATERAL LEGS IN THE MORNING   UNABLE TO FIND Oxygen at 2 liters nasal cannula       Consultations:  None  Procedures/Studies:  Ct Abdomen Pelvis W Contrast  Result Date: 04/12/2017 CLINICAL DATA:  Acute onset of  nausea, vomiting and generalized abdominal pain. Abdominal distention. Initial encounter. EXAM: CT ABDOMEN AND PELVIS WITH CONTRAST TECHNIQUE: Multidetector CT imaging of the abdomen and pelvis was performed using the standard protocol following bolus administration of intravenous contrast. CONTRAST:  25mL ISOVUE-300 IOPAMIDOL (ISOVUE-300) INJECTION 61% COMPARISON:  CT of the abdomen and pelvis performed 08/25/2014 FINDINGS: Lower chest: A small pericardial effusion is noted. Scattered coronary artery calcifications are seen. Calcification is noted at the aortic valve. Right basilar airspace opacity is compatible with pneumonia. A 2.2 cm heterogeneous nodule is noted at the right breast; this appears to have increased in size from 2016. Hepatobiliary: The nodular contour of the liver is compatible with hepatic cirrhosis. The gallbladder is unremarkable in appearance. The common bile duct remains normal in caliber. Pancreas: The pancreas is within normal limits. Spleen: The spleen is unremarkable in appearance. Adrenals/Urinary Tract: The adrenal glands are unremarkable in appearance. A small angiomyolipoma is noted at the posterior aspect of the left kidney. Small bilateral renal cysts are noted. A 2.1 cm heterogeneous nodule at the interpole region of the left kidney is grossly stable from 2016 and likely  benign. A 5 mm nonobstructing stone is noted at the interpole region of the left kidney. There is no evidence of hydronephrosis. No obstructing ureteral stones are identified. Stomach/Bowel: The stomach is unremarkable in appearance. The small bowel is within normal limits. The patient is status post appendectomy. The colon is unremarkable in appearance. Vascular/Lymphatic: Diffuse calcification is seen along the abdominal aorta and its branches. The abdominal aorta is otherwise grossly unremarkable. The inferior vena cava is grossly unremarkable. No retroperitoneal lymphadenopathy is seen. No pelvic sidewall  lymphadenopathy is identified. Reproductive: The bladder is mildly distended. Apparent bladder wall thickening could reflect cystitis. The patient is status post hysterectomy. No suspicious adnexal masses are seen. Other: No additional soft tissue abnormalities are seen. Musculoskeletal: No acute osseous abnormalities are identified. Facet disease is noted at the lower lumbar spine. The visualized musculature is unremarkable in appearance. IMPRESSION: 1. Right basilar pneumonia noted. 2. Apparent bladder wall thickening could reflect cystitis. Would correlate for associated symptoms. 3. 2.2 cm heterogeneous nodule at the right breast appears to have increased in size from 2016. Malignancy cannot be excluded. Would correlate with prior mammograms, and consider diagnostic mammogram for further evaluation. 4. Small pericardial effusion. 5. Scattered coronary artery calcifications. 6. Findings of hepatic cirrhosis. 7. Small bilateral renal cysts noted. Small angiomyolipoma at the posterior aspect of the left kidney. 5 mm nonobstructing stone at the interpole region of the left kidney. 2.1 cm heterogeneous nodule at the interpole region of the left kidney is grossly stable from 2016 and likely benign. 8. Diffuse aortic atherosclerosis. Electronically Signed   By: Garald Balding M.D.   On: 04/12/2017 03:06   Dg Abd Acute W/chest  Result Date: 04/12/2017 CLINICAL DATA:  Nausea.  Abdominal distention. EXAM: DG ABDOMEN ACUTE W/ 1V CHEST COMPARISON:  09/04/2016 FINDINGS: Supine and left lateral decubitus views of the abdomen are negative for bowel obstruction or perforation. No biliary or urinary calculi are evident. The upright view of the chest demonstrates unchanged marked enlargement of the cardiac silhouette. No airspace consolidation or large effusion. IMPRESSION: Negative abdominal radiographs. Unchanged cardiomegaly. No acute findings in the chest. Electronically Signed   By: Andreas Newport M.D.   On:  04/12/2017 00:54      Subjective: - no chest pain, shortness of breath, no abdominal pain, nausea or vomiting.   Discharge Exam: Vitals:   04/13/17 1928 04/14/17 0441  BP: (!) 105/51 (!) 106/48  Pulse: 66 84  Resp: (!) 24 20  Temp: 99.4 F (37.4 C) 98.1 F (36.7 C)  SpO2: 100% 98%    General: Pt is alert, awake, not in acute distress Cardiovascular: irregular, 3/6 SEM Respiratory: CTA bilaterally, no wheezing, no rhonchi Abdominal: Soft, NT, ND, bowel sounds + Extremities: no edema, no cyanosis    The results of significant diagnostics from this hospitalization (including imaging, microbiology, ancillary and laboratory) are listed below for reference.     Microbiology: Recent Results (from the past 240 hour(s))  Urine culture     Status: Abnormal (Preliminary result)   Collection Time: 04/12/17  1:36 AM  Result Value Ref Range Status   Specimen Description URINE, CATHETERIZED  Final   Special Requests NONE  Final   Culture (A)  Final    >=100,000 COLONIES/mL ESCHERICHIA COLI CULTURE REINCUBATED FOR BETTER GROWTH    Report Status PENDING  Incomplete   Organism ID, Bacteria ESCHERICHIA COLI (A)  Final      Susceptibility   Escherichia coli - MIC*    AMPICILLIN >=32 RESISTANT  Resistant     CEFAZOLIN <=4 SENSITIVE Sensitive     CEFTRIAXONE <=1 SENSITIVE Sensitive     CIPROFLOXACIN <=0.25 SENSITIVE Sensitive     GENTAMICIN <=1 SENSITIVE Sensitive     IMIPENEM <=0.25 SENSITIVE Sensitive     NITROFURANTOIN <=16 SENSITIVE Sensitive     TRIMETH/SULFA <=20 SENSITIVE Sensitive     AMPICILLIN/SULBACTAM 16 INTERMEDIATE Intermediate     PIP/TAZO <=4 SENSITIVE Sensitive     Extended ESBL NEGATIVE Sensitive     * >=100,000 COLONIES/mL ESCHERICHIA COLI  MRSA PCR Screening     Status: Abnormal   Collection Time: 04/12/17  6:03 AM  Result Value Ref Range Status   MRSA by PCR POSITIVE (A) NEGATIVE Final    Comment:        The GeneXpert MRSA Assay (FDA approved for NASAL  specimens only), is one component of a comprehensive MRSA colonization surveillance program. It is not intended to diagnose MRSA infection nor to guide or monitor treatment for MRSA infections. RESULT CALLED TO, READ BACK BY AND VERIFIED WITH: H.MATTHEWS RN AT 0848 04/12/17 BY A.DAVIS      Labs: BNP (last 3 results)  Recent Labs  07/30/16 1512 09/05/16 0020  BNP 184.2* 884.1*   Basic Metabolic Panel:  Recent Labs Lab 04/11/17 2211 04/12/17 0808 04/12/17 1249 04/13/17 0224 04/14/17 0411  NA 139 144 138 136 138  K 4.6 QUESTIONABLE RESULTS, RECOMMEND RECOLLECT TO VERIFY 3.8 4.2 3.8  CL 102 126* 104 105 105  CO2 25 15* 26 24 27   GLUCOSE 144* 123* 130* 89 107*  BUN 43* 22* 39* 37* 25*  CREATININE 1.14* 0.36* 0.93 0.86 0.69  CALCIUM 9.5 QUESTIONABLE RESULTS, RECOMMEND RECOLLECT TO VERIFY 8.2* 8.1* 8.1*   Liver Function Tests:  Recent Labs Lab 04/11/17 2211 04/12/17 0808  AST 23 9*  ALT 13* 7*  ALKPHOS 70 24*  BILITOT 1.1 0.6  PROT 7.2 QUESTIONABLE RESULTS, RECOMMEND RECOLLECT TO VERIFY  ALBUMIN 4.0 1.3*    Recent Labs Lab 04/11/17 2211  LIPASE 26   No results for input(s): AMMONIA in the last 168 hours. CBC:  Recent Labs Lab 04/11/17 2211 04/12/17 1320 04/13/17 0224 04/14/17 0411  WBC 7.5 10.4 7.5 7.1  HGB 15.2* 12.9 12.1 13.0  HCT 45.7 39.6 38.2 39.0  MCV 90.3 91.7 91.6 91.3  PLT 161 125* 133* 119*   Cardiac Enzymes: No results for input(s): CKTOTAL, CKMB, CKMBINDEX, TROPONINI in the last 168 hours. BNP: Invalid input(s): POCBNP CBG: No results for input(s): GLUCAP in the last 168 hours. D-Dimer No results for input(s): DDIMER in the last 72 hours. Hgb A1c No results for input(s): HGBA1C in the last 72 hours. Lipid Profile No results for input(s): CHOL, HDL, LDLCALC, TRIG, CHOLHDL, LDLDIRECT in the last 72 hours. Thyroid function studies No results for input(s): TSH, T4TOTAL, T3FREE, THYROIDAB in the last 72 hours.  Invalid input(s):  FREET3 Anemia work up No results for input(s): VITAMINB12, FOLATE, FERRITIN, TIBC, IRON, RETICCTPCT in the last 72 hours. Urinalysis    Component Value Date/Time   COLORURINE AMBER (A) 04/12/2017 0136   APPEARANCEUR TURBID (A) 04/12/2017 0136   LABSPEC 1.013 04/12/2017 0136   PHURINE 5.0 04/12/2017 0136   GLUCOSEU NEGATIVE 04/12/2017 0136   HGBUR NEGATIVE 04/12/2017 0136   BILIRUBINUR NEGATIVE 04/12/2017 0136   KETONESUR NEGATIVE 04/12/2017 0136   PROTEINUR 100 (A) 04/12/2017 0136   UROBILINOGEN 0.2 08/25/2014 1246   NITRITE NEGATIVE 04/12/2017 0136   LEUKOCYTESUR LARGE (A) 04/12/2017 0136   Sepsis  Labs Invalid input(s): PROCALCITONIN,  WBC,  LACTICIDVEN   Time coordinating discharge: 40 minutes  SIGNED:  Marzetta Board, MD  [facility] 04/14/2017, 9:24 AM Pager [phone]  If 7PM-7AM, please contact night-coverage [email or web address] Password [email or web address]

## 2017-04-15 DIAGNOSIS — N183 Chronic kidney disease, stage 3 (moderate): Secondary | ICD-10-CM | POA: Diagnosis not present

## 2017-04-15 DIAGNOSIS — I359 Nonrheumatic aortic valve disorder, unspecified: Secondary | ICD-10-CM | POA: Diagnosis not present

## 2017-04-15 DIAGNOSIS — I4891 Unspecified atrial fibrillation: Secondary | ICD-10-CM | POA: Diagnosis not present

## 2017-04-15 DIAGNOSIS — I519 Heart disease, unspecified: Secondary | ICD-10-CM | POA: Diagnosis not present

## 2017-04-15 DIAGNOSIS — E1159 Type 2 diabetes mellitus with other circulatory complications: Secondary | ICD-10-CM | POA: Diagnosis not present

## 2017-04-15 DIAGNOSIS — I5030 Unspecified diastolic (congestive) heart failure: Secondary | ICD-10-CM | POA: Diagnosis not present

## 2017-04-17 ENCOUNTER — Emergency Department (HOSPITAL_COMMUNITY)

## 2017-04-17 ENCOUNTER — Emergency Department (HOSPITAL_COMMUNITY)
Admission: EM | Admit: 2017-04-17 | Discharge: 2017-04-17 | Disposition: A | Discharge status: Home / Self Care | Attending: Emergency Medicine | Admitting: Emergency Medicine

## 2017-04-17 ENCOUNTER — Encounter (HOSPITAL_COMMUNITY): Payer: Self-pay | Admitting: Emergency Medicine

## 2017-04-17 DIAGNOSIS — E1159 Type 2 diabetes mellitus with other circulatory complications: Secondary | ICD-10-CM | POA: Diagnosis not present

## 2017-04-17 DIAGNOSIS — W19XXXA Unspecified fall, initial encounter: Secondary | ICD-10-CM

## 2017-04-17 DIAGNOSIS — S199XXA Unspecified injury of neck, initial encounter: Secondary | ICD-10-CM | POA: Diagnosis not present

## 2017-04-17 DIAGNOSIS — I359 Nonrheumatic aortic valve disorder, unspecified: Secondary | ICD-10-CM | POA: Diagnosis not present

## 2017-04-17 DIAGNOSIS — Z8543 Personal history of malignant neoplasm of ovary: Secondary | ICD-10-CM | POA: Insufficient documentation

## 2017-04-17 DIAGNOSIS — S0990XA Unspecified injury of head, initial encounter: Secondary | ICD-10-CM | POA: Diagnosis not present

## 2017-04-17 DIAGNOSIS — S098XXA Other specified injuries of head, initial encounter: Secondary | ICD-10-CM | POA: Diagnosis not present

## 2017-04-17 DIAGNOSIS — Z853 Personal history of malignant neoplasm of breast: Secondary | ICD-10-CM | POA: Insufficient documentation

## 2017-04-17 DIAGNOSIS — S0101XA Laceration without foreign body of scalp, initial encounter: Secondary | ICD-10-CM | POA: Diagnosis not present

## 2017-04-17 DIAGNOSIS — I519 Heart disease, unspecified: Secondary | ICD-10-CM | POA: Diagnosis not present

## 2017-04-17 DIAGNOSIS — Y939 Activity, unspecified: Secondary | ICD-10-CM | POA: Insufficient documentation

## 2017-04-17 DIAGNOSIS — R102 Pelvic and perineal pain: Secondary | ICD-10-CM | POA: Diagnosis not present

## 2017-04-17 DIAGNOSIS — E039 Hypothyroidism, unspecified: Secondary | ICD-10-CM | POA: Insufficient documentation

## 2017-04-17 DIAGNOSIS — I5032 Chronic diastolic (congestive) heart failure: Secondary | ICD-10-CM | POA: Diagnosis not present

## 2017-04-17 DIAGNOSIS — Y92121 Bathroom in nursing home as the place of occurrence of the external cause: Secondary | ICD-10-CM | POA: Insufficient documentation

## 2017-04-17 DIAGNOSIS — Z87891 Personal history of nicotine dependence: Secondary | ICD-10-CM | POA: Insufficient documentation

## 2017-04-17 DIAGNOSIS — I110 Hypertensive heart disease with heart failure: Secondary | ICD-10-CM | POA: Insufficient documentation

## 2017-04-17 DIAGNOSIS — S3993XA Unspecified injury of pelvis, initial encounter: Secondary | ICD-10-CM | POA: Diagnosis not present

## 2017-04-17 DIAGNOSIS — W01198A Fall on same level from slipping, tripping and stumbling with subsequent striking against other object, initial encounter: Secondary | ICD-10-CM | POA: Diagnosis not present

## 2017-04-17 DIAGNOSIS — Z79899 Other long term (current) drug therapy: Secondary | ICD-10-CM | POA: Diagnosis not present

## 2017-04-17 DIAGNOSIS — Y999 Unspecified external cause status: Secondary | ICD-10-CM | POA: Diagnosis not present

## 2017-04-17 DIAGNOSIS — I4891 Unspecified atrial fibrillation: Secondary | ICD-10-CM | POA: Diagnosis not present

## 2017-04-17 DIAGNOSIS — I5030 Unspecified diastolic (congestive) heart failure: Secondary | ICD-10-CM | POA: Diagnosis not present

## 2017-04-17 DIAGNOSIS — Z23 Encounter for immunization: Secondary | ICD-10-CM | POA: Insufficient documentation

## 2017-04-17 DIAGNOSIS — N183 Chronic kidney disease, stage 3 (moderate): Secondary | ICD-10-CM | POA: Diagnosis not present

## 2017-04-17 DIAGNOSIS — S59902A Unspecified injury of left elbow, initial encounter: Secondary | ICD-10-CM | POA: Diagnosis not present

## 2017-04-17 DIAGNOSIS — S0181XA Laceration without foreign body of other part of head, initial encounter: Secondary | ICD-10-CM | POA: Diagnosis not present

## 2017-04-17 DIAGNOSIS — Z9049 Acquired absence of other specified parts of digestive tract: Secondary | ICD-10-CM | POA: Diagnosis not present

## 2017-04-17 DIAGNOSIS — S0191XA Laceration without foreign body of unspecified part of head, initial encounter: Secondary | ICD-10-CM | POA: Diagnosis not present

## 2017-04-17 DIAGNOSIS — R079 Chest pain, unspecified: Secondary | ICD-10-CM | POA: Diagnosis not present

## 2017-04-17 DIAGNOSIS — Z8541 Personal history of malignant neoplasm of cervix uteri: Secondary | ICD-10-CM | POA: Diagnosis not present

## 2017-04-17 DIAGNOSIS — M25522 Pain in left elbow: Secondary | ICD-10-CM | POA: Diagnosis not present

## 2017-04-17 MED ORDER — TETANUS-DIPHTH-ACELL PERTUSSIS 5-2.5-18.5 LF-MCG/0.5 IM SUSP
0.5000 mL | Freq: Once | INTRAMUSCULAR | Status: AC
  Administered 2017-04-17: 0.5 mL via INTRAMUSCULAR
  Filled 2017-04-17: qty 0.5

## 2017-04-17 MED ORDER — LIDOCAINE-EPINEPHRINE (PF) 2 %-1:200000 IJ SOLN
10.0000 mL | Freq: Once | INTRAMUSCULAR | Status: AC
  Administered 2017-04-17: 10 mL
  Filled 2017-04-17: qty 20

## 2017-04-17 NOTE — ED Provider Notes (Signed)
[facility] EMERGENCY DEPARTMENT Provider Note   CSN: [id number] Arrival date & time: 04/17/17  1538     History   Chief Complaint Chief Complaint  Patient presents with  . Fall    on blood thinners    HPI [name] is a 83 y.o. female.  83 year old female history of recurrent bowel obstruction, CHF, type 2 diabetes, HTN, hypothyroidism, permanent A. fib not on anticoagulation due to multiple recent falls who presents from [facility] after mechanical fall.  Patient was ambulating in bathroom when she slipped and her head hit the hard floor. Has a scalp laceration to L temporal region, currently hemostatic. Was previously on eliquis but this was stopped several months ago due to fall risk. Patient states her tailbone and known sacral ulcer are sore. Noted to have skin tear to L elbow. Was recently admitted for PNA/UTI, sx are improving on abx.  The history is provided by the patient, a relative, the nursing home and medical records. No language interpreter was used.    Past Medical History:  Diagnosis Date  . Bladder cancer ([place])   . Bowel obstruction ([place]) 08/25/2014  . Breast cancer ([place])   . Cervical cancer ([place])   . Chronic diastolic heart failure ([place])    a. Echo 5/12: Mild LVH, EF 55-60%, mild AI, mild MR, severe LAE, mild RAE, PASP 31, small pericardial effusion  . Colon cancer ([place])    "polyp" (12/08/2012)  . Diastolic CHF, chronic ([place]) 11/11/2012   Class 2b-3 2 d echo 5/12 mild LVH EF 55-50%, MILD AI, MILD MR, SEVERE LAE, mild RAE, PASP 31, SMALL PERICRADIAL EFFUSION  . DM2 (diabetes mellitus, type 2) ([place])    TYPE 2  . H/O ovarian cancer 11/11/2012    MUCINOUS CYST S/P RESECTION 35 YEARS AGO  . HCAP (healthcare-associated pneumonia) 07/19/2015  . HLD (hyperlipidemia)   . Hx of bladder cancer 11/11/2012  . Hx of cervical cancer 11/11/2012  . Hypertension   . Hypothyroidism   . OSA (obstructive sleep apnea)   . Ovarian  cancer ([place])   . Permanent atrial fibrillation ([facility])    a. coumadin d/c'd => Pradaxa in 05/2012  . S/P hysterectomy 11/11/2012  . SBO (small bowel obstruction) ([place])   . SBO (small bowel obstruction) ([place]) 08/25/2014  . Venous insufficiency    chronic LE edema    Patient Active Problem List   Diagnosis Date Noted  . Atrial fibrillation with RVR ([place]) 04/12/2017  . Chronic diastolic CHF (congestive heart failure) ([place]) 04/12/2017  . Gastroenteritis 04/12/2017  . Cellulitis and abscess of trunk 02/02/2017  . Cellulitis 02/02/2017  . Pressure ulcer 02/02/2017  . Severe obesity (BMI >= 40) ([place]) 09/05/2016  . Chronic venous stasis dermatitis of both lower extremities 09/05/2016  . MRSA carrier 09/05/2016  . Chronic cutaneous venous stasis ulcer ([place]) 08/03/2016  . Goals of care, counseling/discussion   . Diabetes mellitus with complication ([place])   . Bilateral leg edema 07/30/2016  . Acute respiratory failure with hypoxia ([place]) 07/30/2016  . HCAP (healthcare-associated pneumonia) 07/19/2015  . Essential hypertension 03/23/2013  . Nausea & vomiting 11/11/2012  . Hypothyroidism 11/11/2012  . Hx SBO 11/11/2012  . Colon cancer ([place]) 11/11/2012  . Acute on chronic diastolic CHF (congestive heart failure) ([place]) 11/11/2012  . Venous insufficiency 11/11/2012  . Hyperlipidemia 11/11/2012  . OSA (obstructive sleep apnea) 11/11/2012  . HX: breast cancer 11/11/2012  . Hx of cervical cancer 11/11/2012  . H/O ovarian  cancer 11/11/2012  . Hx of bladder cancer 11/11/2012  . S/P hysterectomy 11/11/2012  . SBO (small bowel obstruction) ([place]) 12/06/2011  . Dehydration 12/06/2011  . Permanent atrial fibrillation ([place]) 02/17/2011    Past Surgical History:  Procedure Laterality Date  . APPENDECTOMY    . BLADDER SURGERY    . BREAST BIOPSY Bilateral   . BREAST LUMPECTOMY Right   . CATARACT EXTRACTION W/ INTRAOCULAR LENS  IMPLANT, BILATERAL    . EXPLORATORY LAPAROTOMY WITH ABDOMINAL MASS  EXCISION     "21# ovarian tumor; benign" (12/08/2012)  . I&D EXTREMITY Right 12/31/2012   Procedure: IRRIGATION AND DEBRIDEMENT RIGHT KNEE ULCER WITH PLACEMENT OF A CELL AND VAC ;  Surgeon: Theodoro Kos, DO;  Location: [facility];  Service: Plastics;  Laterality: Right;  . MASTECTOMY, RADICAL Left   . VAGINAL HYSTERECTOMY      OB History    No data available       Home Medications    Prior to Admission medications   Medication Sig Start Date End Date Taking? Authorizing Provider  acetaminophen (TYLENOL) 325 MG tablet Take 650 mg by mouth every 6 (six) hours as needed (for pain).   Yes [provider]  bisacodyl (DULCOLAX) 10 MG suppository Place 10 mg rectally once a week. [facility]   Yes [provider]  bisacodyl (DULCOLAX) 5 MG EC tablet Take 5 mg by mouth daily.    Yes [provider]  cephALEXin (KEFLEX) 500 MG capsule Take 1 capsule (500 mg total) by mouth 4 (four) times daily. 04/14/17 04/24/17 Yes Gherghe, Vella Redhead, MD  clobetasol cream (TEMOVATE) 8.84 % Apply 1 application topically daily as needed (rash).   Yes [provider]  cyanocobalamin (,VITAMIN B-12,) 1000 MCG/ML injection Inject 1,000 mcg into the muscle every 30 (thirty) days. GIVEN ON THE LAST DAY OF EACH MONTH   Yes [provider]  diltiazem (CARDIZEM CD) 120 MG 24 hr capsule Take 3 capsules (360 mg total) by mouth daily. 09/06/16  Yes Rama, Venetia Maxon, MD  ferrous sulfate 325 (65 FE) MG tablet Take 325 mg by mouth daily with breakfast.   Yes [provider]  furosemide (LASIX) 40 MG tablet Take 20-60 mg by mouth See admin instructions. 60 mg two times a day SCHEDULED and an additional 20 mg once a day AS NEEDED for a weight gain of 2 pounds or greater in a period of 24 hours   Yes [provider]  levothyroxine (SYNTHROID, LEVOTHROID) 150 MCG tablet Take 150 mcg by mouth daily before breakfast.   Yes [provider]  lisinopril (PRINIVIL,ZESTRIL) 5 MG  tablet Take 5 mg by mouth daily.   Yes [provider]  Neomycin-Bacitracin-Polymyxin (TRIPLE ANTIBIOTIC) 3.5-9317681552 OINT Apply 1 application topically See admin instructions. APPLY TO BILATERAL LEGS IN THE MORNING   Yes [provider]  OXYGEN Inhale 2 L into the lungs See admin instructions. AS NEEDED WHEN AMBULATING OR WHEN EXERTED   Yes [provider]  potassium chloride SA (K-DUR,KLOR-CON) 20 MEQ tablet Take 1 tablet (20 mEq total) by mouth 2 (two) times daily. 09/06/16  Yes Rama, Venetia Maxon, MD  prochlorperazine (COMPAZINE) 25 MG suppository Place 25 mg rectally every 6 (six) hours as needed for vomiting.   Yes [provider]  prochlorperazine (COMPAZINE) 5 MG tablet Take 5 mg by mouth every 6 (six) hours as needed for nausea.   Yes [provider]  senna-docusate (SENOKOT-S) 8.6-50 MG per tablet Take 1 tablet by  mouth 2 (two) times daily. 08/31/14  Yes Vann, Jessica U, DO  sodium phosphate (FLEET) 7-19 GM/118ML ENEM Place 133 mLs (1 enema total) rectally daily as needed for severe constipation. 08/31/14  Yes Geradine Girt, DO  UNABLE TO FIND Oxygen at 2 liters nasal cannula 09/06/16  Yes Rama, Venetia Maxon, MD  Zinc Oxide (DESITIN) 40 % PSTE Apply 1 application topically 3 (three) times daily. APPLY TO BUTTOCKS   Yes [provider]  apixaban (ELIQUIS) 5 MG TABS tablet Take 1 tablet (5 mg total) by mouth 2 (two) times daily. Patient not taking: Reported on 04/17/2017 01/18/14   Thompson Grayer, MD    Family History Family History  Problem Relation Age of Onset  . Hypertension Other     Social History Social History  Substance Use Topics  . Smoking status: Former Smoker    Packs/day: 3.00    Years: 25.00    Types: Cigarettes    Quit date: 07/02/1966  . Smokeless tobacco: Never Used  . Alcohol use No     Allergies   Morphine and related; Statins; Zetia [ezetimibe]; 5-alpha reductase inhibitors; Atorvastatin; Crestor [rosuvastatin  calcium]; Metoprolol succinate [metoprolol]; Morphine; Nsaids; and Codeine   Review of Systems Review of Systems  Constitutional: Negative for chills and fever.  HENT: Negative for ear pain and sore throat.   Eyes: Negative for pain and visual disturbance.  Respiratory: Negative for cough and shortness of breath.   Cardiovascular: Negative for chest pain and palpitations.  Gastrointestinal: Negative for abdominal pain and vomiting.  Genitourinary: Negative for dysuria and hematuria.  Musculoskeletal: Negative for arthralgias and back pain.  Skin: Positive for wound. Negative for color change and rash.  Neurological: Positive for headaches. Negative for seizures and syncope.  All other systems reviewed and are negative.    Physical Exam Updated Vital Signs BP 138/86 (BP Location: Right Arm)   Pulse 74   Temp 97.8 F (36.6 C) (Oral)   Resp 19   SpO2 100%   Physical Exam  Constitutional: She is oriented to person, place, and time. She appears well-developed. No distress.  HENT:  Head: Normocephalic.  Hemostatic L parietal scalp laceration  Eyes: Conjunctivae are normal.  Neck: Neck supple.  Cardiovascular: Normal rate and regular rhythm.   No murmur heard. Pulmonary/Chest: Effort normal and breath sounds normal. No respiratory distress.  Abdominal: Soft. There is no tenderness.  Musculoskeletal: She exhibits no edema.  Neurological: She is alert and oriented to person, place, and time. No cranial nerve deficit. Coordination normal.  No focal neuro deficits. Alert and conversant. Moves all extremities.  Skin: Skin is warm and dry.  Nursing note and vitals reviewed.    ED Treatments / Results  Labs (all labs ordered are listed, but only abnormal results are displayed) Labs Reviewed - No data to display  EKG  EKG Interpretation None       Radiology Dg Chest 1 View  Result Date: 04/17/2017 CLINICAL DATA:  Recent fall with chest pain EXAM: CHEST 1 VIEW  COMPARISON:  04/11/2017 FINDINGS: Cardiac shadow remains enlarged. Mild aortic calcifications are seen. No acute bony abnormality is seen. Patchy changes are noted in the right lung base stable from the prior exam. IMPRESSION: Stable right basilar changes.  No acute bony abnormality is seen. Electronically Signed   By: Inez Catalina M.D.   On: 04/17/2017 17:46   Dg Pelvis 1-2 Views  Result Date: 04/17/2017 CLINICAL DATA:  Recent fall with pelvic pain, initial encounter EXAM:  PELVIS - 1-2 VIEW COMPARISON:  04/12/2017 FINDINGS: Degenerative changes of the hip joints are noted bilaterally right worse than left. Pelvic ring is intact. Degenerative changes of the pubic symphysis are seen. No soft tissue abnormality is noted. IMPRESSION: Degenerative changes of the hip joints and pubic symphysis. No acute abnormality noted. Electronically Signed   By: Inez Catalina M.D.   On: 04/17/2017 17:47   Dg Elbow Complete Left  Result Date: 04/17/2017 CLINICAL DATA:  Recent fall with elbow pain, initial encounter EXAM: LEFT ELBOW - COMPLETE 3+ VIEW COMPARISON:  None. FINDINGS: There is no evidence of fracture, dislocation, or joint effusion. There is no evidence of arthropathy or other focal bone abnormality. Soft tissues are unremarkable. IMPRESSION: No acute abnormality noted. Electronically Signed   By: Inez Catalina M.D.   On: 04/17/2017 17:48   Ct Head Wo Contrast  Result Date: 04/17/2017 CLINICAL DATA:  Fall.  Minor head trauma.  Head laceration. EXAM: CT HEAD WITHOUT CONTRAST CT CERVICAL SPINE WITHOUT CONTRAST TECHNIQUE: Multidetector CT imaging of the head and cervical spine was performed following the standard protocol without intravenous contrast. Multiplanar CT image reconstructions of the cervical spine were also generated. COMPARISON:  CT head 05/03/2007 FINDINGS: CT HEAD FINDINGS Brain: Moderate atrophy with progression. Extensive white matter disease bilaterally similar to the prior study. Negative for  acute infarct. Negative for intracranial hemorrhage. Partially calcified extra-axial mass or right cerebellar pontine angle cistern measures 10 x 25 mm compatible with meningioma. This is similar to the prior study. Vascular: Negative for hyperdense vessel. Skull: Negative for fracture Sinuses/Orbits: Mild mucosal edema in the ethmoid sinuses. Bilateral cataract removal. Other: None CT CERVICAL SPINE FINDINGS Alignment: Normal Skull base and vertebrae: Negative for fracture Soft tissues and spinal canal: Negative for soft tissue mass in the neck. Carotid artery calcification bilaterally. Disc levels: Multilevel disc and facet degeneration, moderate in degree. Upper chest: Negative Other: None IMPRESSION: No acute intracranial abnormality. Atrophy with chronic microvascular ischemia. Calcified meningioma right cerebellar pontine angle cistern stable from prior study Cervical spondylosis.  Negative for fracture. Electronically Signed   By: Franchot Gallo M.D.   On: 04/17/2017 17:25   Ct Cervical Spine Wo Contrast  Result Date: 04/17/2017 CLINICAL DATA:  Fall.  Minor head trauma.  Head laceration. EXAM: CT HEAD WITHOUT CONTRAST CT CERVICAL SPINE WITHOUT CONTRAST TECHNIQUE: Multidetector CT imaging of the head and cervical spine was performed following the standard protocol without intravenous contrast. Multiplanar CT image reconstructions of the cervical spine were also generated. COMPARISON:  CT head 05/03/2007 FINDINGS: CT HEAD FINDINGS Brain: Moderate atrophy with progression. Extensive white matter disease bilaterally similar to the prior study. Negative for acute infarct. Negative for intracranial hemorrhage. Partially calcified extra-axial mass or right cerebellar pontine angle cistern measures 10 x 25 mm compatible with meningioma. This is similar to the prior study. Vascular: Negative for hyperdense vessel. Skull: Negative for fracture Sinuses/Orbits: Mild mucosal edema in the ethmoid sinuses. Bilateral  cataract removal. Other: None CT CERVICAL SPINE FINDINGS Alignment: Normal Skull base and vertebrae: Negative for fracture Soft tissues and spinal canal: Negative for soft tissue mass in the neck. Carotid artery calcification bilaterally. Disc levels: Multilevel disc and facet degeneration, moderate in degree. Upper chest: Negative Other: None IMPRESSION: No acute intracranial abnormality. Atrophy with chronic microvascular ischemia. Calcified meningioma right cerebellar pontine angle cistern stable from prior study Cervical spondylosis.  Negative for fracture. Electronically Signed   By: Franchot Gallo M.D.   On: 04/17/2017 17:25  Procedures .Marland KitchenLaceration Repair Date/Time: 04/17/2017 5:00 PM Performed by: Payton Emerald Authorized by: Payton Emerald   Consent:    Consent obtained:  Verbal   Consent given by:  Patient   Risks discussed:  Infection, pain, poor wound healing and poor cosmetic result   Alternatives discussed:  No treatment Anesthesia (see MAR for exact dosages):    Anesthesia method:  Local infiltration   Local anesthetic:  Lidocaine 2% WITH epi Laceration details:    Location:  Scalp   Scalp location:  L parietal   Length (cm):  3 Repair type:    Repair type:  Simple Pre-procedure details:    Preparation:  Patient was prepped and draped in usual sterile fashion Treatment:    Area cleansed with:  Saline   Amount of cleaning:  Standard   Irrigation solution:  Sterile saline Skin repair:    Repair method:  Staples   Number of staples:  5 Approximation:    Approximation:  Close   Vermilion border: well-aligned   Post-procedure details:    Dressing:  Antibiotic ointment   Patient tolerance of procedure:  Tolerated well, no immediate complications    (including critical care time)  Medications Ordered in ED Medications  Tdap (BOOSTRIX) injection 0.5 mL (0.5 mLs Intramuscular Given 04/17/17 1744)  lidocaine-EPINEPHrine (XYLOCAINE W/EPI) 2 %-1:200000 (PF) injection 10 mL  (10 mLs Infiltration Given 04/17/17 1748)     Initial Impression / Assessment and Plan / ED Course  I have reviewed the triage vital signs and the nursing notes.  Pertinent labs & imaging results that were available during my care of the patient were reviewed by me and considered in my medical decision making (see chart for details).     83 year old female history of recurrent bowel obstruction, CHF, type 2 diabetes, HTN, hypothyroidism, permanent A. fib not on anticoagulation due to multiple recent falls who p/w L scalp laceration after mechanical fall. AF, VSS. No focal neuro deficits.   No acute injuries on CT or XRs. Scalp laceration irrigated and repaired. Wound care instructions provided. Pt will return to PCP or ED in 7 days for staple removal.  Return precautions provided for worsening symptoms. Pt will f/u with PCP at first availability. Pt verbalized agreement with plan.  Pt care d/w Dr. Sherry Ruffing  Final Clinical Impressions(s) / ED Diagnoses   Final diagnoses:  Fall, initial encounter  Laceration of scalp, initial encounter    New Prescriptions Discharge Medication List as of 04/17/2017  8:03 PM       Payton Emerald, MD 04/18/17 1208    Tegeler, Gwenyth Allegra, MD 04/19/17 2329

## 2017-04-17 NOTE — ED Notes (Signed)
Durenda Age  [phone]

## 2017-04-17 NOTE — ED Notes (Signed)
Report called to Durenda Age.

## 2017-04-17 NOTE — ED Notes (Signed)
ED Provider at bedside. 

## 2017-04-17 NOTE — ED Notes (Signed)
Patient transported to CT 

## 2017-04-17 NOTE — ED Notes (Signed)
This RN and Resident spoke to pt and pt's daughter about labwork.  Pt was just here on Sunday; we decided she did not need extra labs.

## 2017-04-17 NOTE — ED Triage Notes (Addendum)
Pt arrives via [facility] from [facility] on [address]. Ems reports pt got up by herself when she was not supposed to and slipped and fell, striking her head. Pt has laceration to posterior left scalp, skin tear to left elbow, no reported LOC from staff at facility. Pt reports she takes eloquis. A/ox4 resp e/u, nad.

## 2017-04-17 NOTE — Discharge Instructions (Signed)
Please avoid actively scrubbing area with staples. Please followup with primary doctor in 1 week for staple removal

## 2017-04-18 DIAGNOSIS — I519 Heart disease, unspecified: Secondary | ICD-10-CM | POA: Diagnosis not present

## 2017-04-18 DIAGNOSIS — N183 Chronic kidney disease, stage 3 (moderate): Secondary | ICD-10-CM | POA: Diagnosis not present

## 2017-04-18 DIAGNOSIS — I359 Nonrheumatic aortic valve disorder, unspecified: Secondary | ICD-10-CM | POA: Diagnosis not present

## 2017-04-18 DIAGNOSIS — I4891 Unspecified atrial fibrillation: Secondary | ICD-10-CM | POA: Diagnosis not present

## 2017-04-18 DIAGNOSIS — E1159 Type 2 diabetes mellitus with other circulatory complications: Secondary | ICD-10-CM | POA: Diagnosis not present

## 2017-04-18 DIAGNOSIS — I5030 Unspecified diastolic (congestive) heart failure: Secondary | ICD-10-CM | POA: Diagnosis not present

## 2017-04-19 DIAGNOSIS — E1159 Type 2 diabetes mellitus with other circulatory complications: Secondary | ICD-10-CM | POA: Diagnosis not present

## 2017-04-19 DIAGNOSIS — I519 Heart disease, unspecified: Secondary | ICD-10-CM | POA: Diagnosis not present

## 2017-04-19 DIAGNOSIS — I359 Nonrheumatic aortic valve disorder, unspecified: Secondary | ICD-10-CM | POA: Diagnosis not present

## 2017-04-19 DIAGNOSIS — I4891 Unspecified atrial fibrillation: Secondary | ICD-10-CM | POA: Diagnosis not present

## 2017-04-19 DIAGNOSIS — I5030 Unspecified diastolic (congestive) heart failure: Secondary | ICD-10-CM | POA: Diagnosis not present

## 2017-04-19 DIAGNOSIS — N183 Chronic kidney disease, stage 3 (moderate): Secondary | ICD-10-CM | POA: Diagnosis not present

## 2017-04-22 DIAGNOSIS — I519 Heart disease, unspecified: Secondary | ICD-10-CM | POA: Diagnosis not present

## 2017-04-22 DIAGNOSIS — I5030 Unspecified diastolic (congestive) heart failure: Secondary | ICD-10-CM | POA: Diagnosis not present

## 2017-04-22 DIAGNOSIS — E1159 Type 2 diabetes mellitus with other circulatory complications: Secondary | ICD-10-CM | POA: Diagnosis not present

## 2017-04-22 DIAGNOSIS — N183 Chronic kidney disease, stage 3 (moderate): Secondary | ICD-10-CM | POA: Diagnosis not present

## 2017-04-22 DIAGNOSIS — I4891 Unspecified atrial fibrillation: Secondary | ICD-10-CM | POA: Diagnosis not present

## 2017-04-22 DIAGNOSIS — I359 Nonrheumatic aortic valve disorder, unspecified: Secondary | ICD-10-CM | POA: Diagnosis not present

## 2017-04-24 DIAGNOSIS — E1159 Type 2 diabetes mellitus with other circulatory complications: Secondary | ICD-10-CM | POA: Diagnosis not present

## 2017-04-24 DIAGNOSIS — N183 Chronic kidney disease, stage 3 (moderate): Secondary | ICD-10-CM | POA: Diagnosis not present

## 2017-04-24 DIAGNOSIS — I519 Heart disease, unspecified: Secondary | ICD-10-CM | POA: Diagnosis not present

## 2017-04-24 DIAGNOSIS — I359 Nonrheumatic aortic valve disorder, unspecified: Secondary | ICD-10-CM | POA: Diagnosis not present

## 2017-04-24 DIAGNOSIS — I5030 Unspecified diastolic (congestive) heart failure: Secondary | ICD-10-CM | POA: Diagnosis not present

## 2017-04-24 DIAGNOSIS — I4891 Unspecified atrial fibrillation: Secondary | ICD-10-CM | POA: Diagnosis not present

## 2017-04-25 DIAGNOSIS — E1159 Type 2 diabetes mellitus with other circulatory complications: Secondary | ICD-10-CM | POA: Diagnosis not present

## 2017-04-25 DIAGNOSIS — N183 Chronic kidney disease, stage 3 (moderate): Secondary | ICD-10-CM | POA: Diagnosis not present

## 2017-04-25 DIAGNOSIS — I5030 Unspecified diastolic (congestive) heart failure: Secondary | ICD-10-CM | POA: Diagnosis not present

## 2017-04-25 DIAGNOSIS — I359 Nonrheumatic aortic valve disorder, unspecified: Secondary | ICD-10-CM | POA: Diagnosis not present

## 2017-04-25 DIAGNOSIS — I4891 Unspecified atrial fibrillation: Secondary | ICD-10-CM | POA: Diagnosis not present

## 2017-04-25 DIAGNOSIS — I519 Heart disease, unspecified: Secondary | ICD-10-CM | POA: Diagnosis not present

## 2017-04-26 DIAGNOSIS — S0101XD Laceration without foreign body of scalp, subsequent encounter: Secondary | ICD-10-CM | POA: Diagnosis not present

## 2017-04-26 DIAGNOSIS — N183 Chronic kidney disease, stage 3 (moderate): Secondary | ICD-10-CM | POA: Diagnosis not present

## 2017-04-26 DIAGNOSIS — E039 Hypothyroidism, unspecified: Secondary | ICD-10-CM | POA: Diagnosis not present

## 2017-04-26 DIAGNOSIS — I4891 Unspecified atrial fibrillation: Secondary | ICD-10-CM | POA: Diagnosis not present

## 2017-04-26 DIAGNOSIS — R609 Edema, unspecified: Secondary | ICD-10-CM | POA: Diagnosis not present

## 2017-04-26 DIAGNOSIS — I5030 Unspecified diastolic (congestive) heart failure: Secondary | ICD-10-CM | POA: Diagnosis not present

## 2017-04-26 DIAGNOSIS — L98429 Non-pressure chronic ulcer of back with unspecified severity: Secondary | ICD-10-CM | POA: Diagnosis not present

## 2017-04-26 DIAGNOSIS — E1159 Type 2 diabetes mellitus with other circulatory complications: Secondary | ICD-10-CM | POA: Diagnosis not present

## 2017-04-26 DIAGNOSIS — I519 Heart disease, unspecified: Secondary | ICD-10-CM | POA: Diagnosis not present

## 2017-04-26 DIAGNOSIS — S0101XA Laceration without foreign body of scalp, initial encounter: Secondary | ICD-10-CM | POA: Diagnosis not present

## 2017-04-26 DIAGNOSIS — I10 Essential (primary) hypertension: Secondary | ICD-10-CM | POA: Diagnosis not present

## 2017-04-26 DIAGNOSIS — I359 Nonrheumatic aortic valve disorder, unspecified: Secondary | ICD-10-CM | POA: Diagnosis not present

## 2017-04-26 DIAGNOSIS — Z23 Encounter for immunization: Secondary | ICD-10-CM | POA: Diagnosis not present

## 2017-04-26 DIAGNOSIS — N390 Urinary tract infection, site not specified: Secondary | ICD-10-CM | POA: Diagnosis not present

## 2017-04-29 DIAGNOSIS — I5030 Unspecified diastolic (congestive) heart failure: Secondary | ICD-10-CM | POA: Diagnosis not present

## 2017-04-29 DIAGNOSIS — E1159 Type 2 diabetes mellitus with other circulatory complications: Secondary | ICD-10-CM | POA: Diagnosis not present

## 2017-04-29 DIAGNOSIS — I359 Nonrheumatic aortic valve disorder, unspecified: Secondary | ICD-10-CM | POA: Diagnosis not present

## 2017-04-29 DIAGNOSIS — N183 Chronic kidney disease, stage 3 (moderate): Secondary | ICD-10-CM | POA: Diagnosis not present

## 2017-04-29 DIAGNOSIS — I4891 Unspecified atrial fibrillation: Secondary | ICD-10-CM | POA: Diagnosis not present

## 2017-04-29 DIAGNOSIS — I519 Heart disease, unspecified: Secondary | ICD-10-CM | POA: Diagnosis not present

## 2017-05-01 DIAGNOSIS — I4891 Unspecified atrial fibrillation: Secondary | ICD-10-CM | POA: Diagnosis not present

## 2017-05-01 DIAGNOSIS — I359 Nonrheumatic aortic valve disorder, unspecified: Secondary | ICD-10-CM | POA: Diagnosis not present

## 2017-05-01 DIAGNOSIS — I5030 Unspecified diastolic (congestive) heart failure: Secondary | ICD-10-CM | POA: Diagnosis not present

## 2017-05-01 DIAGNOSIS — N183 Chronic kidney disease, stage 3 (moderate): Secondary | ICD-10-CM | POA: Diagnosis not present

## 2017-05-01 DIAGNOSIS — I519 Heart disease, unspecified: Secondary | ICD-10-CM | POA: Diagnosis not present

## 2017-05-01 DIAGNOSIS — E1159 Type 2 diabetes mellitus with other circulatory complications: Secondary | ICD-10-CM | POA: Diagnosis not present

## 2017-05-02 DIAGNOSIS — Z853 Personal history of malignant neoplasm of breast: Secondary | ICD-10-CM | POA: Diagnosis not present

## 2017-05-02 DIAGNOSIS — E785 Hyperlipidemia, unspecified: Secondary | ICD-10-CM | POA: Diagnosis not present

## 2017-05-02 DIAGNOSIS — I359 Nonrheumatic aortic valve disorder, unspecified: Secondary | ICD-10-CM | POA: Diagnosis not present

## 2017-05-02 DIAGNOSIS — L209 Atopic dermatitis, unspecified: Secondary | ICD-10-CM | POA: Diagnosis not present

## 2017-05-02 DIAGNOSIS — N183 Chronic kidney disease, stage 3 (moderate): Secondary | ICD-10-CM | POA: Diagnosis not present

## 2017-05-02 DIAGNOSIS — I5030 Unspecified diastolic (congestive) heart failure: Secondary | ICD-10-CM | POA: Diagnosis not present

## 2017-05-02 DIAGNOSIS — Z8551 Personal history of malignant neoplasm of bladder: Secondary | ICD-10-CM | POA: Diagnosis not present

## 2017-05-02 DIAGNOSIS — R0902 Hypoxemia: Secondary | ICD-10-CM | POA: Diagnosis not present

## 2017-05-02 DIAGNOSIS — I519 Heart disease, unspecified: Secondary | ICD-10-CM | POA: Diagnosis not present

## 2017-05-02 DIAGNOSIS — E039 Hypothyroidism, unspecified: Secondary | ICD-10-CM | POA: Diagnosis not present

## 2017-05-02 DIAGNOSIS — E1159 Type 2 diabetes mellitus with other circulatory complications: Secondary | ICD-10-CM | POA: Diagnosis not present

## 2017-05-02 DIAGNOSIS — I10 Essential (primary) hypertension: Secondary | ICD-10-CM | POA: Diagnosis not present

## 2017-05-02 DIAGNOSIS — I4891 Unspecified atrial fibrillation: Secondary | ICD-10-CM | POA: Diagnosis not present

## 2017-05-02 DIAGNOSIS — Z85038 Personal history of other malignant neoplasm of large intestine: Secondary | ICD-10-CM | POA: Diagnosis not present

## 2017-05-03 DIAGNOSIS — I359 Nonrheumatic aortic valve disorder, unspecified: Secondary | ICD-10-CM | POA: Diagnosis not present

## 2017-05-03 DIAGNOSIS — E1159 Type 2 diabetes mellitus with other circulatory complications: Secondary | ICD-10-CM | POA: Diagnosis not present

## 2017-05-03 DIAGNOSIS — N183 Chronic kidney disease, stage 3 (moderate): Secondary | ICD-10-CM | POA: Diagnosis not present

## 2017-05-03 DIAGNOSIS — I519 Heart disease, unspecified: Secondary | ICD-10-CM | POA: Diagnosis not present

## 2017-05-03 DIAGNOSIS — I5030 Unspecified diastolic (congestive) heart failure: Secondary | ICD-10-CM | POA: Diagnosis not present

## 2017-05-03 DIAGNOSIS — I4891 Unspecified atrial fibrillation: Secondary | ICD-10-CM | POA: Diagnosis not present

## 2017-05-06 DIAGNOSIS — E1159 Type 2 diabetes mellitus with other circulatory complications: Secondary | ICD-10-CM | POA: Diagnosis not present

## 2017-05-06 DIAGNOSIS — I519 Heart disease, unspecified: Secondary | ICD-10-CM | POA: Diagnosis not present

## 2017-05-06 DIAGNOSIS — I4891 Unspecified atrial fibrillation: Secondary | ICD-10-CM | POA: Diagnosis not present

## 2017-05-06 DIAGNOSIS — I5030 Unspecified diastolic (congestive) heart failure: Secondary | ICD-10-CM | POA: Diagnosis not present

## 2017-05-06 DIAGNOSIS — N183 Chronic kidney disease, stage 3 (moderate): Secondary | ICD-10-CM | POA: Diagnosis not present

## 2017-05-06 DIAGNOSIS — I359 Nonrheumatic aortic valve disorder, unspecified: Secondary | ICD-10-CM | POA: Diagnosis not present

## 2017-05-07 DIAGNOSIS — I4891 Unspecified atrial fibrillation: Secondary | ICD-10-CM | POA: Diagnosis not present

## 2017-05-07 DIAGNOSIS — I519 Heart disease, unspecified: Secondary | ICD-10-CM | POA: Diagnosis not present

## 2017-05-07 DIAGNOSIS — N183 Chronic kidney disease, stage 3 (moderate): Secondary | ICD-10-CM | POA: Diagnosis not present

## 2017-05-07 DIAGNOSIS — E1159 Type 2 diabetes mellitus with other circulatory complications: Secondary | ICD-10-CM | POA: Diagnosis not present

## 2017-05-07 DIAGNOSIS — I5030 Unspecified diastolic (congestive) heart failure: Secondary | ICD-10-CM | POA: Diagnosis not present

## 2017-05-07 DIAGNOSIS — I359 Nonrheumatic aortic valve disorder, unspecified: Secondary | ICD-10-CM | POA: Diagnosis not present

## 2017-05-08 DIAGNOSIS — E1159 Type 2 diabetes mellitus with other circulatory complications: Secondary | ICD-10-CM | POA: Diagnosis not present

## 2017-05-08 DIAGNOSIS — I359 Nonrheumatic aortic valve disorder, unspecified: Secondary | ICD-10-CM | POA: Diagnosis not present

## 2017-05-08 DIAGNOSIS — I519 Heart disease, unspecified: Secondary | ICD-10-CM | POA: Diagnosis not present

## 2017-05-08 DIAGNOSIS — I5030 Unspecified diastolic (congestive) heart failure: Secondary | ICD-10-CM | POA: Diagnosis not present

## 2017-05-08 DIAGNOSIS — I4891 Unspecified atrial fibrillation: Secondary | ICD-10-CM | POA: Diagnosis not present

## 2017-05-08 DIAGNOSIS — N183 Chronic kidney disease, stage 3 (moderate): Secondary | ICD-10-CM | POA: Diagnosis not present

## 2017-05-09 DIAGNOSIS — I519 Heart disease, unspecified: Secondary | ICD-10-CM | POA: Diagnosis not present

## 2017-05-09 DIAGNOSIS — I4891 Unspecified atrial fibrillation: Secondary | ICD-10-CM | POA: Diagnosis not present

## 2017-05-09 DIAGNOSIS — I359 Nonrheumatic aortic valve disorder, unspecified: Secondary | ICD-10-CM | POA: Diagnosis not present

## 2017-05-09 DIAGNOSIS — I5030 Unspecified diastolic (congestive) heart failure: Secondary | ICD-10-CM | POA: Diagnosis not present

## 2017-05-09 DIAGNOSIS — E1159 Type 2 diabetes mellitus with other circulatory complications: Secondary | ICD-10-CM | POA: Diagnosis not present

## 2017-05-09 DIAGNOSIS — N183 Chronic kidney disease, stage 3 (moderate): Secondary | ICD-10-CM | POA: Diagnosis not present

## 2017-05-10 DIAGNOSIS — N183 Chronic kidney disease, stage 3 (moderate): Secondary | ICD-10-CM | POA: Diagnosis not present

## 2017-05-10 DIAGNOSIS — I519 Heart disease, unspecified: Secondary | ICD-10-CM | POA: Diagnosis not present

## 2017-05-10 DIAGNOSIS — E1159 Type 2 diabetes mellitus with other circulatory complications: Secondary | ICD-10-CM | POA: Diagnosis not present

## 2017-05-10 DIAGNOSIS — I359 Nonrheumatic aortic valve disorder, unspecified: Secondary | ICD-10-CM | POA: Diagnosis not present

## 2017-05-10 DIAGNOSIS — I5030 Unspecified diastolic (congestive) heart failure: Secondary | ICD-10-CM | POA: Diagnosis not present

## 2017-05-10 DIAGNOSIS — I4891 Unspecified atrial fibrillation: Secondary | ICD-10-CM | POA: Diagnosis not present

## 2017-05-13 DIAGNOSIS — E1159 Type 2 diabetes mellitus with other circulatory complications: Secondary | ICD-10-CM | POA: Diagnosis not present

## 2017-05-13 DIAGNOSIS — I5030 Unspecified diastolic (congestive) heart failure: Secondary | ICD-10-CM | POA: Diagnosis not present

## 2017-05-13 DIAGNOSIS — I519 Heart disease, unspecified: Secondary | ICD-10-CM | POA: Diagnosis not present

## 2017-05-13 DIAGNOSIS — I359 Nonrheumatic aortic valve disorder, unspecified: Secondary | ICD-10-CM | POA: Diagnosis not present

## 2017-05-13 DIAGNOSIS — I4891 Unspecified atrial fibrillation: Secondary | ICD-10-CM | POA: Diagnosis not present

## 2017-05-13 DIAGNOSIS — N183 Chronic kidney disease, stage 3 (moderate): Secondary | ICD-10-CM | POA: Diagnosis not present

## 2017-05-15 DIAGNOSIS — I519 Heart disease, unspecified: Secondary | ICD-10-CM | POA: Diagnosis not present

## 2017-05-15 DIAGNOSIS — I5030 Unspecified diastolic (congestive) heart failure: Secondary | ICD-10-CM | POA: Diagnosis not present

## 2017-05-15 DIAGNOSIS — N183 Chronic kidney disease, stage 3 (moderate): Secondary | ICD-10-CM | POA: Diagnosis not present

## 2017-05-15 DIAGNOSIS — I359 Nonrheumatic aortic valve disorder, unspecified: Secondary | ICD-10-CM | POA: Diagnosis not present

## 2017-05-15 DIAGNOSIS — E1159 Type 2 diabetes mellitus with other circulatory complications: Secondary | ICD-10-CM | POA: Diagnosis not present

## 2017-05-15 DIAGNOSIS — I4891 Unspecified atrial fibrillation: Secondary | ICD-10-CM | POA: Diagnosis not present

## 2017-05-16 DIAGNOSIS — I519 Heart disease, unspecified: Secondary | ICD-10-CM | POA: Diagnosis not present

## 2017-05-16 DIAGNOSIS — I4891 Unspecified atrial fibrillation: Secondary | ICD-10-CM | POA: Diagnosis not present

## 2017-05-16 DIAGNOSIS — E1159 Type 2 diabetes mellitus with other circulatory complications: Secondary | ICD-10-CM | POA: Diagnosis not present

## 2017-05-16 DIAGNOSIS — I359 Nonrheumatic aortic valve disorder, unspecified: Secondary | ICD-10-CM | POA: Diagnosis not present

## 2017-05-16 DIAGNOSIS — N183 Chronic kidney disease, stage 3 (moderate): Secondary | ICD-10-CM | POA: Diagnosis not present

## 2017-05-16 DIAGNOSIS — I5030 Unspecified diastolic (congestive) heart failure: Secondary | ICD-10-CM | POA: Diagnosis not present

## 2017-05-17 DIAGNOSIS — I5030 Unspecified diastolic (congestive) heart failure: Secondary | ICD-10-CM | POA: Diagnosis not present

## 2017-05-17 DIAGNOSIS — I359 Nonrheumatic aortic valve disorder, unspecified: Secondary | ICD-10-CM | POA: Diagnosis not present

## 2017-05-17 DIAGNOSIS — E1159 Type 2 diabetes mellitus with other circulatory complications: Secondary | ICD-10-CM | POA: Diagnosis not present

## 2017-05-17 DIAGNOSIS — N183 Chronic kidney disease, stage 3 (moderate): Secondary | ICD-10-CM | POA: Diagnosis not present

## 2017-05-17 DIAGNOSIS — I4891 Unspecified atrial fibrillation: Secondary | ICD-10-CM | POA: Diagnosis not present

## 2017-05-17 DIAGNOSIS — I519 Heart disease, unspecified: Secondary | ICD-10-CM | POA: Diagnosis not present

## 2017-05-20 DIAGNOSIS — N183 Chronic kidney disease, stage 3 (moderate): Secondary | ICD-10-CM | POA: Diagnosis not present

## 2017-05-20 DIAGNOSIS — I4891 Unspecified atrial fibrillation: Secondary | ICD-10-CM | POA: Diagnosis not present

## 2017-05-20 DIAGNOSIS — I519 Heart disease, unspecified: Secondary | ICD-10-CM | POA: Diagnosis not present

## 2017-05-20 DIAGNOSIS — I5030 Unspecified diastolic (congestive) heart failure: Secondary | ICD-10-CM | POA: Diagnosis not present

## 2017-05-20 DIAGNOSIS — I359 Nonrheumatic aortic valve disorder, unspecified: Secondary | ICD-10-CM | POA: Diagnosis not present

## 2017-05-20 DIAGNOSIS — E1159 Type 2 diabetes mellitus with other circulatory complications: Secondary | ICD-10-CM | POA: Diagnosis not present

## 2017-05-21 DIAGNOSIS — E1159 Type 2 diabetes mellitus with other circulatory complications: Secondary | ICD-10-CM | POA: Diagnosis not present

## 2017-05-21 DIAGNOSIS — N183 Chronic kidney disease, stage 3 (moderate): Secondary | ICD-10-CM | POA: Diagnosis not present

## 2017-05-21 DIAGNOSIS — I519 Heart disease, unspecified: Secondary | ICD-10-CM | POA: Diagnosis not present

## 2017-05-21 DIAGNOSIS — I359 Nonrheumatic aortic valve disorder, unspecified: Secondary | ICD-10-CM | POA: Diagnosis not present

## 2017-05-21 DIAGNOSIS — I4891 Unspecified atrial fibrillation: Secondary | ICD-10-CM | POA: Diagnosis not present

## 2017-05-21 DIAGNOSIS — I5030 Unspecified diastolic (congestive) heart failure: Secondary | ICD-10-CM | POA: Diagnosis not present

## 2017-05-22 DIAGNOSIS — I359 Nonrheumatic aortic valve disorder, unspecified: Secondary | ICD-10-CM | POA: Diagnosis not present

## 2017-05-22 DIAGNOSIS — N183 Chronic kidney disease, stage 3 (moderate): Secondary | ICD-10-CM | POA: Diagnosis not present

## 2017-05-22 DIAGNOSIS — E1159 Type 2 diabetes mellitus with other circulatory complications: Secondary | ICD-10-CM | POA: Diagnosis not present

## 2017-05-22 DIAGNOSIS — I519 Heart disease, unspecified: Secondary | ICD-10-CM | POA: Diagnosis not present

## 2017-05-22 DIAGNOSIS — I4891 Unspecified atrial fibrillation: Secondary | ICD-10-CM | POA: Diagnosis not present

## 2017-05-22 DIAGNOSIS — I5030 Unspecified diastolic (congestive) heart failure: Secondary | ICD-10-CM | POA: Diagnosis not present

## 2017-05-24 DIAGNOSIS — I5030 Unspecified diastolic (congestive) heart failure: Secondary | ICD-10-CM | POA: Diagnosis not present

## 2017-05-24 DIAGNOSIS — I359 Nonrheumatic aortic valve disorder, unspecified: Secondary | ICD-10-CM | POA: Diagnosis not present

## 2017-05-24 DIAGNOSIS — N183 Chronic kidney disease, stage 3 (moderate): Secondary | ICD-10-CM | POA: Diagnosis not present

## 2017-05-24 DIAGNOSIS — I4891 Unspecified atrial fibrillation: Secondary | ICD-10-CM | POA: Diagnosis not present

## 2017-05-24 DIAGNOSIS — I519 Heart disease, unspecified: Secondary | ICD-10-CM | POA: Diagnosis not present

## 2017-05-24 DIAGNOSIS — E1159 Type 2 diabetes mellitus with other circulatory complications: Secondary | ICD-10-CM | POA: Diagnosis not present

## 2017-05-27 DIAGNOSIS — I5030 Unspecified diastolic (congestive) heart failure: Secondary | ICD-10-CM | POA: Diagnosis not present

## 2017-05-27 DIAGNOSIS — I359 Nonrheumatic aortic valve disorder, unspecified: Secondary | ICD-10-CM | POA: Diagnosis not present

## 2017-05-27 DIAGNOSIS — I4891 Unspecified atrial fibrillation: Secondary | ICD-10-CM | POA: Diagnosis not present

## 2017-05-27 DIAGNOSIS — I519 Heart disease, unspecified: Secondary | ICD-10-CM | POA: Diagnosis not present

## 2017-05-27 DIAGNOSIS — E1159 Type 2 diabetes mellitus with other circulatory complications: Secondary | ICD-10-CM | POA: Diagnosis not present

## 2017-05-27 DIAGNOSIS — N183 Chronic kidney disease, stage 3 (moderate): Secondary | ICD-10-CM | POA: Diagnosis not present

## 2017-05-28 DIAGNOSIS — I4891 Unspecified atrial fibrillation: Secondary | ICD-10-CM | POA: Diagnosis not present

## 2017-05-28 DIAGNOSIS — I519 Heart disease, unspecified: Secondary | ICD-10-CM | POA: Diagnosis not present

## 2017-05-28 DIAGNOSIS — I359 Nonrheumatic aortic valve disorder, unspecified: Secondary | ICD-10-CM | POA: Diagnosis not present

## 2017-05-28 DIAGNOSIS — I5030 Unspecified diastolic (congestive) heart failure: Secondary | ICD-10-CM | POA: Diagnosis not present

## 2017-05-28 DIAGNOSIS — E1159 Type 2 diabetes mellitus with other circulatory complications: Secondary | ICD-10-CM | POA: Diagnosis not present

## 2017-05-28 DIAGNOSIS — N183 Chronic kidney disease, stage 3 (moderate): Secondary | ICD-10-CM | POA: Diagnosis not present

## 2017-05-30 DIAGNOSIS — I4891 Unspecified atrial fibrillation: Secondary | ICD-10-CM | POA: Diagnosis not present

## 2017-05-30 DIAGNOSIS — N183 Chronic kidney disease, stage 3 (moderate): Secondary | ICD-10-CM | POA: Diagnosis not present

## 2017-05-30 DIAGNOSIS — E1159 Type 2 diabetes mellitus with other circulatory complications: Secondary | ICD-10-CM | POA: Diagnosis not present

## 2017-05-30 DIAGNOSIS — I519 Heart disease, unspecified: Secondary | ICD-10-CM | POA: Diagnosis not present

## 2017-05-30 DIAGNOSIS — I359 Nonrheumatic aortic valve disorder, unspecified: Secondary | ICD-10-CM | POA: Diagnosis not present

## 2017-05-30 DIAGNOSIS — I5030 Unspecified diastolic (congestive) heart failure: Secondary | ICD-10-CM | POA: Diagnosis not present

## 2017-06-01 DIAGNOSIS — I359 Nonrheumatic aortic valve disorder, unspecified: Secondary | ICD-10-CM | POA: Diagnosis not present

## 2017-06-01 DIAGNOSIS — E039 Hypothyroidism, unspecified: Secondary | ICD-10-CM | POA: Diagnosis not present

## 2017-06-01 DIAGNOSIS — Z853 Personal history of malignant neoplasm of breast: Secondary | ICD-10-CM | POA: Diagnosis not present

## 2017-06-01 DIAGNOSIS — E1159 Type 2 diabetes mellitus with other circulatory complications: Secondary | ICD-10-CM | POA: Diagnosis not present

## 2017-06-01 DIAGNOSIS — I10 Essential (primary) hypertension: Secondary | ICD-10-CM | POA: Diagnosis not present

## 2017-06-01 DIAGNOSIS — I4891 Unspecified atrial fibrillation: Secondary | ICD-10-CM | POA: Diagnosis not present

## 2017-06-01 DIAGNOSIS — N183 Chronic kidney disease, stage 3 (moderate): Secondary | ICD-10-CM | POA: Diagnosis not present

## 2017-06-01 DIAGNOSIS — L209 Atopic dermatitis, unspecified: Secondary | ICD-10-CM | POA: Diagnosis not present

## 2017-06-01 DIAGNOSIS — Z85038 Personal history of other malignant neoplasm of large intestine: Secondary | ICD-10-CM | POA: Diagnosis not present

## 2017-06-01 DIAGNOSIS — I519 Heart disease, unspecified: Secondary | ICD-10-CM | POA: Diagnosis not present

## 2017-06-01 DIAGNOSIS — I5030 Unspecified diastolic (congestive) heart failure: Secondary | ICD-10-CM | POA: Diagnosis not present

## 2017-06-01 DIAGNOSIS — E785 Hyperlipidemia, unspecified: Secondary | ICD-10-CM | POA: Diagnosis not present

## 2017-06-01 DIAGNOSIS — Z8551 Personal history of malignant neoplasm of bladder: Secondary | ICD-10-CM | POA: Diagnosis not present

## 2017-06-01 DIAGNOSIS — R0902 Hypoxemia: Secondary | ICD-10-CM | POA: Diagnosis not present

## 2017-06-03 DIAGNOSIS — I5030 Unspecified diastolic (congestive) heart failure: Secondary | ICD-10-CM | POA: Diagnosis not present

## 2017-06-03 DIAGNOSIS — I359 Nonrheumatic aortic valve disorder, unspecified: Secondary | ICD-10-CM | POA: Diagnosis not present

## 2017-06-03 DIAGNOSIS — I519 Heart disease, unspecified: Secondary | ICD-10-CM | POA: Diagnosis not present

## 2017-06-03 DIAGNOSIS — M25552 Pain in left hip: Secondary | ICD-10-CM | POA: Diagnosis not present

## 2017-06-03 DIAGNOSIS — E1159 Type 2 diabetes mellitus with other circulatory complications: Secondary | ICD-10-CM | POA: Diagnosis not present

## 2017-06-03 DIAGNOSIS — N183 Chronic kidney disease, stage 3 (moderate): Secondary | ICD-10-CM | POA: Diagnosis not present

## 2017-06-03 DIAGNOSIS — I4891 Unspecified atrial fibrillation: Secondary | ICD-10-CM | POA: Diagnosis not present

## 2017-06-04 DIAGNOSIS — I359 Nonrheumatic aortic valve disorder, unspecified: Secondary | ICD-10-CM | POA: Diagnosis not present

## 2017-06-04 DIAGNOSIS — I5030 Unspecified diastolic (congestive) heart failure: Secondary | ICD-10-CM | POA: Diagnosis not present

## 2017-06-04 DIAGNOSIS — I4891 Unspecified atrial fibrillation: Secondary | ICD-10-CM | POA: Diagnosis not present

## 2017-06-04 DIAGNOSIS — E1159 Type 2 diabetes mellitus with other circulatory complications: Secondary | ICD-10-CM | POA: Diagnosis not present

## 2017-06-04 DIAGNOSIS — I519 Heart disease, unspecified: Secondary | ICD-10-CM | POA: Diagnosis not present

## 2017-06-04 DIAGNOSIS — N183 Chronic kidney disease, stage 3 (moderate): Secondary | ICD-10-CM | POA: Diagnosis not present

## 2017-06-06 DIAGNOSIS — E1159 Type 2 diabetes mellitus with other circulatory complications: Secondary | ICD-10-CM | POA: Diagnosis not present

## 2017-06-06 DIAGNOSIS — N183 Chronic kidney disease, stage 3 (moderate): Secondary | ICD-10-CM | POA: Diagnosis not present

## 2017-06-06 DIAGNOSIS — I5030 Unspecified diastolic (congestive) heart failure: Secondary | ICD-10-CM | POA: Diagnosis not present

## 2017-06-06 DIAGNOSIS — I4891 Unspecified atrial fibrillation: Secondary | ICD-10-CM | POA: Diagnosis not present

## 2017-06-06 DIAGNOSIS — I359 Nonrheumatic aortic valve disorder, unspecified: Secondary | ICD-10-CM | POA: Diagnosis not present

## 2017-06-06 DIAGNOSIS — I519 Heart disease, unspecified: Secondary | ICD-10-CM | POA: Diagnosis not present

## 2017-06-07 DIAGNOSIS — I519 Heart disease, unspecified: Secondary | ICD-10-CM | POA: Diagnosis not present

## 2017-06-07 DIAGNOSIS — N183 Chronic kidney disease, stage 3 (moderate): Secondary | ICD-10-CM | POA: Diagnosis not present

## 2017-06-07 DIAGNOSIS — I359 Nonrheumatic aortic valve disorder, unspecified: Secondary | ICD-10-CM | POA: Diagnosis not present

## 2017-06-07 DIAGNOSIS — I5030 Unspecified diastolic (congestive) heart failure: Secondary | ICD-10-CM | POA: Diagnosis not present

## 2017-06-07 DIAGNOSIS — E1159 Type 2 diabetes mellitus with other circulatory complications: Secondary | ICD-10-CM | POA: Diagnosis not present

## 2017-06-07 DIAGNOSIS — I4891 Unspecified atrial fibrillation: Secondary | ICD-10-CM | POA: Diagnosis not present

## 2017-06-11 DIAGNOSIS — I5030 Unspecified diastolic (congestive) heart failure: Secondary | ICD-10-CM | POA: Diagnosis not present

## 2017-06-11 DIAGNOSIS — E1159 Type 2 diabetes mellitus with other circulatory complications: Secondary | ICD-10-CM | POA: Diagnosis not present

## 2017-06-11 DIAGNOSIS — I359 Nonrheumatic aortic valve disorder, unspecified: Secondary | ICD-10-CM | POA: Diagnosis not present

## 2017-06-11 DIAGNOSIS — I4891 Unspecified atrial fibrillation: Secondary | ICD-10-CM | POA: Diagnosis not present

## 2017-06-11 DIAGNOSIS — N183 Chronic kidney disease, stage 3 (moderate): Secondary | ICD-10-CM | POA: Diagnosis not present

## 2017-06-11 DIAGNOSIS — I519 Heart disease, unspecified: Secondary | ICD-10-CM | POA: Diagnosis not present

## 2017-06-13 DIAGNOSIS — I4891 Unspecified atrial fibrillation: Secondary | ICD-10-CM | POA: Diagnosis not present

## 2017-06-13 DIAGNOSIS — N183 Chronic kidney disease, stage 3 (moderate): Secondary | ICD-10-CM | POA: Diagnosis not present

## 2017-06-13 DIAGNOSIS — I359 Nonrheumatic aortic valve disorder, unspecified: Secondary | ICD-10-CM | POA: Diagnosis not present

## 2017-06-13 DIAGNOSIS — I519 Heart disease, unspecified: Secondary | ICD-10-CM | POA: Diagnosis not present

## 2017-06-13 DIAGNOSIS — E1159 Type 2 diabetes mellitus with other circulatory complications: Secondary | ICD-10-CM | POA: Diagnosis not present

## 2017-06-13 DIAGNOSIS — I5030 Unspecified diastolic (congestive) heart failure: Secondary | ICD-10-CM | POA: Diagnosis not present

## 2017-06-14 DIAGNOSIS — I519 Heart disease, unspecified: Secondary | ICD-10-CM | POA: Diagnosis not present

## 2017-06-14 DIAGNOSIS — N183 Chronic kidney disease, stage 3 (moderate): Secondary | ICD-10-CM | POA: Diagnosis not present

## 2017-06-14 DIAGNOSIS — E1159 Type 2 diabetes mellitus with other circulatory complications: Secondary | ICD-10-CM | POA: Diagnosis not present

## 2017-06-14 DIAGNOSIS — I359 Nonrheumatic aortic valve disorder, unspecified: Secondary | ICD-10-CM | POA: Diagnosis not present

## 2017-06-14 DIAGNOSIS — I4891 Unspecified atrial fibrillation: Secondary | ICD-10-CM | POA: Diagnosis not present

## 2017-06-14 DIAGNOSIS — I5030 Unspecified diastolic (congestive) heart failure: Secondary | ICD-10-CM | POA: Diagnosis not present

## 2017-06-17 DIAGNOSIS — I359 Nonrheumatic aortic valve disorder, unspecified: Secondary | ICD-10-CM | POA: Diagnosis not present

## 2017-06-17 DIAGNOSIS — L853 Xerosis cutis: Secondary | ICD-10-CM | POA: Diagnosis not present

## 2017-06-17 DIAGNOSIS — I519 Heart disease, unspecified: Secondary | ICD-10-CM | POA: Diagnosis not present

## 2017-06-17 DIAGNOSIS — Q845 Enlarged and hypertrophic nails: Secondary | ICD-10-CM | POA: Diagnosis not present

## 2017-06-17 DIAGNOSIS — I5030 Unspecified diastolic (congestive) heart failure: Secondary | ICD-10-CM | POA: Diagnosis not present

## 2017-06-17 DIAGNOSIS — I739 Peripheral vascular disease, unspecified: Secondary | ICD-10-CM | POA: Diagnosis not present

## 2017-06-17 DIAGNOSIS — I4891 Unspecified atrial fibrillation: Secondary | ICD-10-CM | POA: Diagnosis not present

## 2017-06-17 DIAGNOSIS — L84 Corns and callosities: Secondary | ICD-10-CM | POA: Diagnosis not present

## 2017-06-17 DIAGNOSIS — N183 Chronic kidney disease, stage 3 (moderate): Secondary | ICD-10-CM | POA: Diagnosis not present

## 2017-06-17 DIAGNOSIS — L603 Nail dystrophy: Secondary | ICD-10-CM | POA: Diagnosis not present

## 2017-06-17 DIAGNOSIS — M2010 Hallux valgus (acquired), unspecified foot: Secondary | ICD-10-CM | POA: Diagnosis not present

## 2017-06-17 DIAGNOSIS — E1159 Type 2 diabetes mellitus with other circulatory complications: Secondary | ICD-10-CM | POA: Diagnosis not present

## 2017-06-17 DIAGNOSIS — B351 Tinea unguium: Secondary | ICD-10-CM | POA: Diagnosis not present

## 2017-06-18 DIAGNOSIS — E1159 Type 2 diabetes mellitus with other circulatory complications: Secondary | ICD-10-CM | POA: Diagnosis not present

## 2017-06-18 DIAGNOSIS — I519 Heart disease, unspecified: Secondary | ICD-10-CM | POA: Diagnosis not present

## 2017-06-18 DIAGNOSIS — I359 Nonrheumatic aortic valve disorder, unspecified: Secondary | ICD-10-CM | POA: Diagnosis not present

## 2017-06-18 DIAGNOSIS — N183 Chronic kidney disease, stage 3 (moderate): Secondary | ICD-10-CM | POA: Diagnosis not present

## 2017-06-18 DIAGNOSIS — I5030 Unspecified diastolic (congestive) heart failure: Secondary | ICD-10-CM | POA: Diagnosis not present

## 2017-06-18 DIAGNOSIS — I4891 Unspecified atrial fibrillation: Secondary | ICD-10-CM | POA: Diagnosis not present

## 2017-06-20 DIAGNOSIS — I359 Nonrheumatic aortic valve disorder, unspecified: Secondary | ICD-10-CM | POA: Diagnosis not present

## 2017-06-20 DIAGNOSIS — I4891 Unspecified atrial fibrillation: Secondary | ICD-10-CM | POA: Diagnosis not present

## 2017-06-20 DIAGNOSIS — I519 Heart disease, unspecified: Secondary | ICD-10-CM | POA: Diagnosis not present

## 2017-06-20 DIAGNOSIS — N183 Chronic kidney disease, stage 3 (moderate): Secondary | ICD-10-CM | POA: Diagnosis not present

## 2017-06-20 DIAGNOSIS — I5030 Unspecified diastolic (congestive) heart failure: Secondary | ICD-10-CM | POA: Diagnosis not present

## 2017-06-20 DIAGNOSIS — E1159 Type 2 diabetes mellitus with other circulatory complications: Secondary | ICD-10-CM | POA: Diagnosis not present

## 2017-06-21 DIAGNOSIS — N183 Chronic kidney disease, stage 3 (moderate): Secondary | ICD-10-CM | POA: Diagnosis not present

## 2017-06-21 DIAGNOSIS — E1159 Type 2 diabetes mellitus with other circulatory complications: Secondary | ICD-10-CM | POA: Diagnosis not present

## 2017-06-21 DIAGNOSIS — I5030 Unspecified diastolic (congestive) heart failure: Secondary | ICD-10-CM | POA: Diagnosis not present

## 2017-06-21 DIAGNOSIS — I4891 Unspecified atrial fibrillation: Secondary | ICD-10-CM | POA: Diagnosis not present

## 2017-06-21 DIAGNOSIS — I519 Heart disease, unspecified: Secondary | ICD-10-CM | POA: Diagnosis not present

## 2017-06-21 DIAGNOSIS — I359 Nonrheumatic aortic valve disorder, unspecified: Secondary | ICD-10-CM | POA: Diagnosis not present

## 2017-06-24 DIAGNOSIS — N183 Chronic kidney disease, stage 3 (moderate): Secondary | ICD-10-CM | POA: Diagnosis not present

## 2017-06-24 DIAGNOSIS — I5030 Unspecified diastolic (congestive) heart failure: Secondary | ICD-10-CM | POA: Diagnosis not present

## 2017-06-24 DIAGNOSIS — E1159 Type 2 diabetes mellitus with other circulatory complications: Secondary | ICD-10-CM | POA: Diagnosis not present

## 2017-06-24 DIAGNOSIS — I519 Heart disease, unspecified: Secondary | ICD-10-CM | POA: Diagnosis not present

## 2017-06-24 DIAGNOSIS — I4891 Unspecified atrial fibrillation: Secondary | ICD-10-CM | POA: Diagnosis not present

## 2017-06-24 DIAGNOSIS — I359 Nonrheumatic aortic valve disorder, unspecified: Secondary | ICD-10-CM | POA: Diagnosis not present

## 2017-06-27 DIAGNOSIS — I4891 Unspecified atrial fibrillation: Secondary | ICD-10-CM | POA: Diagnosis not present

## 2017-06-27 DIAGNOSIS — I5030 Unspecified diastolic (congestive) heart failure: Secondary | ICD-10-CM | POA: Diagnosis not present

## 2017-06-27 DIAGNOSIS — I359 Nonrheumatic aortic valve disorder, unspecified: Secondary | ICD-10-CM | POA: Diagnosis not present

## 2017-06-27 DIAGNOSIS — E1159 Type 2 diabetes mellitus with other circulatory complications: Secondary | ICD-10-CM | POA: Diagnosis not present

## 2017-06-27 DIAGNOSIS — I519 Heart disease, unspecified: Secondary | ICD-10-CM | POA: Diagnosis not present

## 2017-06-27 DIAGNOSIS — N183 Chronic kidney disease, stage 3 (moderate): Secondary | ICD-10-CM | POA: Diagnosis not present

## 2017-06-28 DIAGNOSIS — I359 Nonrheumatic aortic valve disorder, unspecified: Secondary | ICD-10-CM | POA: Diagnosis not present

## 2017-06-28 DIAGNOSIS — N183 Chronic kidney disease, stage 3 (moderate): Secondary | ICD-10-CM | POA: Diagnosis not present

## 2017-06-28 DIAGNOSIS — I4891 Unspecified atrial fibrillation: Secondary | ICD-10-CM | POA: Diagnosis not present

## 2017-06-28 DIAGNOSIS — I519 Heart disease, unspecified: Secondary | ICD-10-CM | POA: Diagnosis not present

## 2017-06-28 DIAGNOSIS — I5030 Unspecified diastolic (congestive) heart failure: Secondary | ICD-10-CM | POA: Diagnosis not present

## 2017-06-28 DIAGNOSIS — E1159 Type 2 diabetes mellitus with other circulatory complications: Secondary | ICD-10-CM | POA: Diagnosis not present

## 2017-07-01 DIAGNOSIS — E1159 Type 2 diabetes mellitus with other circulatory complications: Secondary | ICD-10-CM | POA: Diagnosis not present

## 2017-07-01 DIAGNOSIS — N183 Chronic kidney disease, stage 3 (moderate): Secondary | ICD-10-CM | POA: Diagnosis not present

## 2017-07-01 DIAGNOSIS — I359 Nonrheumatic aortic valve disorder, unspecified: Secondary | ICD-10-CM | POA: Diagnosis not present

## 2017-07-01 DIAGNOSIS — I5030 Unspecified diastolic (congestive) heart failure: Secondary | ICD-10-CM | POA: Diagnosis not present

## 2017-07-01 DIAGNOSIS — I4891 Unspecified atrial fibrillation: Secondary | ICD-10-CM | POA: Diagnosis not present

## 2017-07-01 DIAGNOSIS — I519 Heart disease, unspecified: Secondary | ICD-10-CM | POA: Diagnosis not present

## 2017-07-02 DIAGNOSIS — I519 Heart disease, unspecified: Secondary | ICD-10-CM | POA: Diagnosis not present

## 2017-07-02 DIAGNOSIS — Z85038 Personal history of other malignant neoplasm of large intestine: Secondary | ICD-10-CM | POA: Diagnosis not present

## 2017-07-02 DIAGNOSIS — Z8551 Personal history of malignant neoplasm of bladder: Secondary | ICD-10-CM | POA: Diagnosis not present

## 2017-07-02 DIAGNOSIS — I4891 Unspecified atrial fibrillation: Secondary | ICD-10-CM | POA: Diagnosis not present

## 2017-07-02 DIAGNOSIS — I10 Essential (primary) hypertension: Secondary | ICD-10-CM | POA: Diagnosis not present

## 2017-07-02 DIAGNOSIS — E1159 Type 2 diabetes mellitus with other circulatory complications: Secondary | ICD-10-CM | POA: Diagnosis not present

## 2017-07-02 DIAGNOSIS — E785 Hyperlipidemia, unspecified: Secondary | ICD-10-CM | POA: Diagnosis not present

## 2017-07-02 DIAGNOSIS — I359 Nonrheumatic aortic valve disorder, unspecified: Secondary | ICD-10-CM | POA: Diagnosis not present

## 2017-07-02 DIAGNOSIS — N183 Chronic kidney disease, stage 3 (moderate): Secondary | ICD-10-CM | POA: Diagnosis not present

## 2017-07-02 DIAGNOSIS — I5030 Unspecified diastolic (congestive) heart failure: Secondary | ICD-10-CM | POA: Diagnosis not present

## 2017-07-02 DIAGNOSIS — R0902 Hypoxemia: Secondary | ICD-10-CM | POA: Diagnosis not present

## 2017-07-02 DIAGNOSIS — E039 Hypothyroidism, unspecified: Secondary | ICD-10-CM | POA: Diagnosis not present

## 2017-07-02 DIAGNOSIS — Z853 Personal history of malignant neoplasm of breast: Secondary | ICD-10-CM | POA: Diagnosis not present

## 2017-07-02 DIAGNOSIS — L209 Atopic dermatitis, unspecified: Secondary | ICD-10-CM | POA: Diagnosis not present

## 2017-07-04 DIAGNOSIS — I359 Nonrheumatic aortic valve disorder, unspecified: Secondary | ICD-10-CM | POA: Diagnosis not present

## 2017-07-04 DIAGNOSIS — E1159 Type 2 diabetes mellitus with other circulatory complications: Secondary | ICD-10-CM | POA: Diagnosis not present

## 2017-07-04 DIAGNOSIS — I5030 Unspecified diastolic (congestive) heart failure: Secondary | ICD-10-CM | POA: Diagnosis not present

## 2017-07-04 DIAGNOSIS — I519 Heart disease, unspecified: Secondary | ICD-10-CM | POA: Diagnosis not present

## 2017-07-04 DIAGNOSIS — N183 Chronic kidney disease, stage 3 (moderate): Secondary | ICD-10-CM | POA: Diagnosis not present

## 2017-07-04 DIAGNOSIS — I4891 Unspecified atrial fibrillation: Secondary | ICD-10-CM | POA: Diagnosis not present

## 2017-07-05 ENCOUNTER — Telehealth: Payer: Self-pay | Admitting: *Deleted

## 2017-07-05 ENCOUNTER — Encounter: Payer: Self-pay | Admitting: Nurse Practitioner

## 2017-07-05 ENCOUNTER — Ambulatory Visit (INDEPENDENT_AMBULATORY_CARE_PROVIDER_SITE_OTHER): Admitting: Nurse Practitioner

## 2017-07-05 VITALS — BP 128/70 | HR 71 | Ht 61.0 in

## 2017-07-05 DIAGNOSIS — I359 Nonrheumatic aortic valve disorder, unspecified: Secondary | ICD-10-CM | POA: Diagnosis not present

## 2017-07-05 DIAGNOSIS — E1159 Type 2 diabetes mellitus with other circulatory complications: Secondary | ICD-10-CM | POA: Diagnosis not present

## 2017-07-05 DIAGNOSIS — N183 Chronic kidney disease, stage 3 (moderate): Secondary | ICD-10-CM | POA: Diagnosis not present

## 2017-07-05 DIAGNOSIS — I5030 Unspecified diastolic (congestive) heart failure: Secondary | ICD-10-CM | POA: Diagnosis not present

## 2017-07-05 DIAGNOSIS — I4891 Unspecified atrial fibrillation: Secondary | ICD-10-CM | POA: Diagnosis not present

## 2017-07-05 DIAGNOSIS — I4821 Permanent atrial fibrillation: Secondary | ICD-10-CM

## 2017-07-05 DIAGNOSIS — I519 Heart disease, unspecified: Secondary | ICD-10-CM | POA: Diagnosis not present

## 2017-07-05 DIAGNOSIS — I5032 Chronic diastolic (congestive) heart failure: Secondary | ICD-10-CM

## 2017-07-05 DIAGNOSIS — I10 Essential (primary) hypertension: Secondary | ICD-10-CM | POA: Diagnosis not present

## 2017-07-05 DIAGNOSIS — I482 Chronic atrial fibrillation: Secondary | ICD-10-CM | POA: Diagnosis not present

## 2017-07-05 NOTE — Telephone Encounter (Signed)
Faxing to [facility] pt's ov note form ov today @ [phone], phone # is [phone].

## 2017-07-05 NOTE — Patient Instructions (Signed)
We will be checking the following labs today - NONE   Medication Instructions:    Continue with your current medicines.     Testing/Procedures To Be Arranged:  N/A  Follow-Up:   See us back as needed.     Other Special Instructions:   N/A    If you need a refill on your cardiac medications before your next appointment, please call your pharmacy.   Call the [facility] office at [phone] if you have any questions, problems or concerns.      

## 2017-07-05 NOTE — Progress Notes (Signed)
CARDIOLOGY OFFICE NOTE  Date:  07/05/2017    [name] Date of Birth: 1920-06-18 Medical Record #[id number]  PCP:  Hulan Fess, MD  Cardiologist:  Servando [facility]  Chief Complaint  Patient presents with  . Atrial Fibrillation  . Congestive Heart Failure    Follow up visit - seen for Dr. Rayann Heman    History of Present Illness: [name] is a 84 y.o. female who presents today for a follow up visit. Seen for Dr. Rayann Heman.   She has a past medical history of permanent Afib (on Eliquis), HTN, moderate aortic stenosis, chronic diastolic CHF, HLD, OSA, and DM. Also with history of cancer - bladder, breast and ovarian.   Her last Echo 1/2017with a LVEF of 55-60%, no wall motion abnormalities, Moderate AS, and biatrial enlargement.   The patient was admitted 07/30/16-08/07/16 for acute hypoxic respiratory failure in setting of pneumonia and CHF. Patient Was treated with IV Lasix with significant clinical improvement. Patient is net negative by almost 9.5 L. Blood pressure was intermittently soft during admission.She had afib RVR earlier in admission which was treated with addition of metoprolol. However, later developed symptomatic bradycardia in 30s to 40s. Discontinued metoprolol. Continued cardizem CD 120mg  qd. She was discharged on Lasix 40 MG twice a day for one week and then cut back to 40 MG once a day.  Saw Vin back for a post hospital visit and then has seen Dr. Rayann Heman back several times -  - felt to be doing ok but weight still an issue. Diuretics were increased for a few days.   I then saw her back in July - refused to wear her oxygen. Not able to stand. Edema progressively worse. Sats could be in the 80's. She was actually happy with how she was doing. She is 89.   Comes in today. Here in a wheelchair. Here with her daughter. She is now on Hospice services. Uses oxygen prn. Not as short of breath. Weight has been around 202 to 203. No longer on blood thinners due  to falls - this was stopped by Dr. Rex Kras. Daughter notes that it is quite an ordeal to transport her. Asking whether Hospice could just pick up - too hard to keep coming here and basically we are not really managing anything. Daughter notes "that if she just behaves, she does fine". She is just on oxygen prn.   Past Medical History:  Diagnosis Date  . Bladder cancer ([place])   . Bowel obstruction ([place]) 08/25/2014  . Breast cancer ([place])   . Cervical cancer ([place])   . Chronic diastolic heart failure ([place])    a. Echo 5/12: Mild LVH, EF 55-60%, mild AI, mild MR, severe LAE, mild RAE, PASP 31, small pericardial effusion  . Colon cancer ([place])    "polyp" (12/08/2012)  . Diastolic CHF, chronic ([place]) 11/11/2012   Class 2b-3 2 d echo 5/12 mild LVH EF 55-50%, MILD AI, MILD MR, SEVERE LAE, mild RAE, PASP 31, SMALL PERICRADIAL EFFUSION  . DM2 (diabetes mellitus, type 2) ([place])    TYPE 2  . H/O ovarian cancer 11/11/2012    MUCINOUS CYST S/P RESECTION 35 YEARS AGO  . HCAP (healthcare-associated pneumonia) 07/19/2015  . HLD (hyperlipidemia)   . Hx of bladder cancer 11/11/2012  . Hx of cervical cancer 11/11/2012  . Hypertension   . Hypothyroidism   . OSA (obstructive sleep apnea)   . Ovarian cancer ([place])   . Permanent atrial fibrillation ([place])  a. coumadin d/c'd => Pradaxa in 05/2012  . S/P hysterectomy 11/11/2012  . SBO (small bowel obstruction) ([place])   . SBO (small bowel obstruction) ([place]) 08/25/2014  . Venous insufficiency    chronic LE edema    Past Surgical History:  Procedure Laterality Date  . APPENDECTOMY    . BLADDER SURGERY    . BREAST BIOPSY Bilateral   . BREAST LUMPECTOMY Right   . CATARACT EXTRACTION W/ INTRAOCULAR LENS  IMPLANT, BILATERAL    . EXPLORATORY LAPAROTOMY WITH ABDOMINAL MASS EXCISION     "21# ovarian tumor; benign" (12/08/2012)  . I&D EXTREMITY Right 12/31/2012   Procedure: IRRIGATION AND DEBRIDEMENT RIGHT KNEE ULCER WITH PLACEMENT OF A CELL AND VAC ;  Surgeon: Theodoro Kos, DO;   Location: [facility];  Service: Plastics;  Laterality: Right;  . MASTECTOMY, RADICAL Left   . VAGINAL HYSTERECTOMY       Medications: Current Meds  Medication Sig  . acetaminophen (TYLENOL) 325 MG tablet Take 650 mg by mouth every 6 (six) hours as needed (for pain).  . bisacodyl (DULCOLAX) 10 MG suppository Place 10 mg rectally once a week. WEDNESDAYS  . bisacodyl (DULCOLAX) 5 MG EC tablet Take 5 mg by mouth daily.   . clobetasol cream (TEMOVATE) 8.93 % Apply 1 application topically daily as needed (rash).  . cyanocobalamin (,VITAMIN B-12,) 1000 MCG/ML injection Inject 1,000 mcg into the muscle every 30 (thirty) days. GIVEN ON THE LAST DAY OF EACH MONTH  . diltiazem (CARDIZEM CD) 120 MG 24 hr capsule Take 3 capsules (360 mg total) by mouth daily.  . ferrous sulfate 325 (65 FE) MG tablet Take 325 mg by mouth daily with breakfast.  . furosemide (LASIX) 40 MG tablet Take 20-60 mg by mouth See admin instructions. 60 mg two times a day SCHEDULED and an additional 20 mg once a day AS NEEDED for a weight gain of 2 pounds or greater in a period of 24 hours  . levothyroxine (SYNTHROID, LEVOTHROID) 150 MCG tablet Take 150 mcg by mouth daily before breakfast.  . lisinopril (PRINIVIL,ZESTRIL) 5 MG tablet Take 5 mg by mouth daily.  Marland Kitchen Neomycin-Bacitracin-Polymyxin (TRIPLE ANTIBIOTIC) [phone] OINT Apply 1 application topically See admin instructions. APPLY TO BILATERAL LEGS IN THE MORNING  . OXYGEN Inhale 2 L into the lungs See admin instructions. AS NEEDED WHEN AMBULATING OR WHEN EXERTED  . potassium chloride SA (K-DUR,KLOR-CON) 20 MEQ tablet Take 1 tablet (20 mEq total) by mouth 2 (two) times daily.  . prochlorperazine (COMPAZINE) 25 MG suppository Place 25 mg rectally every 6 (six) hours as needed for vomiting.  . prochlorperazine (COMPAZINE) 5 MG tablet Take 5 mg by mouth every 6 (six) hours as needed for nausea.  Marland Kitchen senna-docusate (SENOKOT-S) 8.6-50 MG per tablet Take 1 tablet by mouth 2 (two) times  daily.  . sodium phosphate (FLEET) 7-19 GM/118ML ENEM Place 133 mLs (1 enema total) rectally daily as needed for severe constipation.  . Sodium Phosphates (FLEET ENEMA RE) Place rectally as needed (for severe constipation).  Marland Kitchen UNABLE TO FIND Oxygen at 2 liters nasal cannula  . Zinc Oxide (DESITIN) 40 % PSTE Apply 1 application topically 3 (three) times daily. APPLY TO BUTTOCKS  . [DISCONTINUED] apixaban (ELIQUIS) 5 MG TABS tablet Take 1 tablet (5 mg total) by mouth 2 (two) times daily.     Allergies: Allergies  Allergen Reactions  . Morphine And Related Other (See Comments)    "Sick"  . Statins Other (See Comments)    "Sick," muscle  aches, and leg swelling  . Zetia [Ezetimibe] Other (See Comments)    Side effect too strong   . 5-Alpha Reductase Inhibitors Other (See Comments)    Listed on MAR as an "allergy"  . Atorvastatin Nausea Only, Swelling and Other (See Comments)    Leg swelling also and muscle aches  . Crestor [Rosuvastatin Calcium]     Myalgia   . Metoprolol Succinate [Metoprolol]     Bradycardia  . Morphine     Vomiting (intolerance)  . Nsaids Other (See Comments)    Not noted on the patient's MAR, but listed with "allergies"  . Codeine Nausea And Vomiting    Social History: The patient  reports that she quit smoking about 51 years ago. Her smoking use included cigarettes. She has a 75.00 pack-year smoking history. she has never used smokeless tobacco. She reports that she does not drink alcohol or use drugs.   Family History: The patient's family history includes Hypertension in her other.   Review of Systems: Please see the history of present illness.   Otherwise, the review of systems is positive for none.   All other systems are reviewed and negative.   Physical Exam: VS:  BP 128/70 (BP Location: Left Arm, Patient Position: Sitting, Cuff Size: Normal)   Pulse 71   Ht 5\' 1"  (1.549 m)   SpO2 91% Comment: at rest  BMI 38.45 kg/m  .  BMI Body mass index is  38.45 kg/m.  Wt Readings from Last 3 Encounters:  04/14/17 203 lb 8 oz (92.3 kg)  12/13/16 213 lb (96.6 kg)  10/08/16 206 lb 12.8 oz (93.8 kg)    General: Massively obese. Elderly. Chronically ill appearing but alert and in no acute distress.  She is not able to stand.  HEENT: Normal but with missing teeth.   Neck: Supple, no JVD, carotid bruits, or masses noted.  Cardiac: Irregular irregular rhythm. Rate is ok. Harsh outflow murmur. Legs remain very full with edema.  Respiratory:  Lungs are fairly clear - decreased due to body habitus.  GI: Obese. Soft and nontender.  MS: No deformity or atrophy. Gait not tested.  Skin: Warm and dry. Color is normal.  Neuro:  Strength and sensation are intact and no gross focal deficits noted.  Psych: Alert, appropriate and with normal affect.   LABORATORY DATA:  EKG:  EKG is not ordered today.  Lab Results  Component Value Date   WBC 7.1 04/14/2017   HGB 13.0 04/14/2017   HCT 39.0 04/14/2017   PLT 119 (L) 04/14/2017   GLUCOSE 107 (H) 04/14/2017   CHOL 173 11/04/2010   TRIG 100 11/04/2010   HDL 45 11/04/2010   LDLCALC (H) 11/04/2010    108        Total Cholesterol/HDL:CHD Risk Coronary Heart Disease Risk Table                     Men   Women  1/2 Average Risk   3.4   3.3  Average Risk       5.0   4.4  2 X Average Risk   9.6   7.1  3 X Average Risk  23.4   11.0        Use the calculated Patient Ratio above and the CHD Risk Table to determine the patient's CHD Risk.        ATP III CLASSIFICATION (LDL):  <100     mg/dL   Optimal  100-129  mg/dL  Near or Above                    Optimal  130-159  mg/dL   Borderline  160-189  mg/dL   High  >190     mg/dL   Very High   ALT 7 (L) 04/12/2017   AST 9 (L) 04/12/2017   NA 138 04/14/2017   K 3.8 04/14/2017   CL 105 04/14/2017   CREATININE 0.69 04/14/2017   BUN 25 (H) 04/14/2017   CO2 27 04/14/2017   TSH 13.294 (H) 02/03/2017   INR 1.30 12/29/2013   HGBA1C 5.3 02/03/2017        BNP (last 3 results) Recent Labs    07/30/16 1512 09/05/16 0020  BNP 184.2* 305.5*    ProBNP (last 3 results) Recent Labs    12/13/16 1457  PROBNP 1,174*     Other Studies Reviewed Today:  Echo Study Conclusions 07/2015  - Left ventricle: The cavity size was mildly dilated. Wall thickness was increased in a pattern of mild LVH. Systolic function was normal. The estimated ejection fraction was in the range of 55% to 60%. Wall motion was normal; there were no regional wall motion abnormalities. - Aortic valve: Valve mobility was restricted. There was moderate stenosis. - Mitral valve: Calcified annulus. Mildly thickened leaflets . There was moderate regurgitation. - Left atrium: The atrium was severely dilated. - Right atrium: The atrium was mildly dilated. - Pulmonary arteries: Systolic pressure was moderately increased. PA peak pressure: 61 mm Hg (S). - Pericardium, extracardiac: A small pericardial effusion was identified.  Impressions:  - Normal LV systolic function; biatrial enlargement; heavily calcified aortic valve with moderate AS by mean gradient; moderate MR; mild TR with moderately elevated pulmonary pressure; small pericardial effusion.   Assessment/Plan:   1. Permanent afib - now managed with just rate control. Off anticoagulation due to continued falls/age, etc.   2. Chronic diastolic HF - she has a history of non compliance - but has actually done surprisingly well to be 84 years of age. On chronic Lasix. Now on Hospice services.   3. Chronic anticoagulation - this has been stopped.   4. HTN - BP ok on current regimen. No changes made today.   5. Advanced age - now on Hospice.     Current medicines are reviewed with the patient today.  The patient does not have concerns regarding medicines other than what has been noted above.  The following changes have been made:  See above.  Labs/ tests ordered  today include:   No orders of the defined types were placed in this encounter.    Disposition:   FU with [place] prn. We will allo Hospice to take over her care. Be available as needed.   Patient is agreeable to this plan and will call if any problems develop in the interim.   SignedTruitt Merle, NP  07/05/2017 3:21 PM  [facility] [address] [place], [place]  [address] Phone: [phone] Fax: [phone]

## 2017-07-08 DIAGNOSIS — I359 Nonrheumatic aortic valve disorder, unspecified: Secondary | ICD-10-CM | POA: Diagnosis not present

## 2017-07-08 DIAGNOSIS — I5030 Unspecified diastolic (congestive) heart failure: Secondary | ICD-10-CM | POA: Diagnosis not present

## 2017-07-08 DIAGNOSIS — I4891 Unspecified atrial fibrillation: Secondary | ICD-10-CM | POA: Diagnosis not present

## 2017-07-08 DIAGNOSIS — N183 Chronic kidney disease, stage 3 (moderate): Secondary | ICD-10-CM | POA: Diagnosis not present

## 2017-07-08 DIAGNOSIS — E1159 Type 2 diabetes mellitus with other circulatory complications: Secondary | ICD-10-CM | POA: Diagnosis not present

## 2017-07-08 DIAGNOSIS — I519 Heart disease, unspecified: Secondary | ICD-10-CM | POA: Diagnosis not present

## 2017-07-09 DIAGNOSIS — I4891 Unspecified atrial fibrillation: Secondary | ICD-10-CM | POA: Diagnosis not present

## 2017-07-09 DIAGNOSIS — E1159 Type 2 diabetes mellitus with other circulatory complications: Secondary | ICD-10-CM | POA: Diagnosis not present

## 2017-07-09 DIAGNOSIS — I519 Heart disease, unspecified: Secondary | ICD-10-CM | POA: Diagnosis not present

## 2017-07-09 DIAGNOSIS — N183 Chronic kidney disease, stage 3 (moderate): Secondary | ICD-10-CM | POA: Diagnosis not present

## 2017-07-09 DIAGNOSIS — I359 Nonrheumatic aortic valve disorder, unspecified: Secondary | ICD-10-CM | POA: Diagnosis not present

## 2017-07-09 DIAGNOSIS — I5030 Unspecified diastolic (congestive) heart failure: Secondary | ICD-10-CM | POA: Diagnosis not present

## 2017-07-11 DIAGNOSIS — I4891 Unspecified atrial fibrillation: Secondary | ICD-10-CM | POA: Diagnosis not present

## 2017-07-11 DIAGNOSIS — I519 Heart disease, unspecified: Secondary | ICD-10-CM | POA: Diagnosis not present

## 2017-07-11 DIAGNOSIS — I5030 Unspecified diastolic (congestive) heart failure: Secondary | ICD-10-CM | POA: Diagnosis not present

## 2017-07-11 DIAGNOSIS — I359 Nonrheumatic aortic valve disorder, unspecified: Secondary | ICD-10-CM | POA: Diagnosis not present

## 2017-07-11 DIAGNOSIS — E1159 Type 2 diabetes mellitus with other circulatory complications: Secondary | ICD-10-CM | POA: Diagnosis not present

## 2017-07-11 DIAGNOSIS — N183 Chronic kidney disease, stage 3 (moderate): Secondary | ICD-10-CM | POA: Diagnosis not present

## 2017-07-12 DIAGNOSIS — I519 Heart disease, unspecified: Secondary | ICD-10-CM | POA: Diagnosis not present

## 2017-07-12 DIAGNOSIS — I4891 Unspecified atrial fibrillation: Secondary | ICD-10-CM | POA: Diagnosis not present

## 2017-07-12 DIAGNOSIS — N183 Chronic kidney disease, stage 3 (moderate): Secondary | ICD-10-CM | POA: Diagnosis not present

## 2017-07-12 DIAGNOSIS — E1159 Type 2 diabetes mellitus with other circulatory complications: Secondary | ICD-10-CM | POA: Diagnosis not present

## 2017-07-12 DIAGNOSIS — I5030 Unspecified diastolic (congestive) heart failure: Secondary | ICD-10-CM | POA: Diagnosis not present

## 2017-07-12 DIAGNOSIS — I359 Nonrheumatic aortic valve disorder, unspecified: Secondary | ICD-10-CM | POA: Diagnosis not present

## 2017-07-15 ENCOUNTER — Ambulatory Visit: Payer: Medicare Other | Admitting: Nurse Practitioner

## 2017-07-15 DIAGNOSIS — I5030 Unspecified diastolic (congestive) heart failure: Secondary | ICD-10-CM | POA: Diagnosis not present

## 2017-07-15 DIAGNOSIS — N183 Chronic kidney disease, stage 3 (moderate): Secondary | ICD-10-CM | POA: Diagnosis not present

## 2017-07-15 DIAGNOSIS — I359 Nonrheumatic aortic valve disorder, unspecified: Secondary | ICD-10-CM | POA: Diagnosis not present

## 2017-07-15 DIAGNOSIS — I4891 Unspecified atrial fibrillation: Secondary | ICD-10-CM | POA: Diagnosis not present

## 2017-07-15 DIAGNOSIS — E1159 Type 2 diabetes mellitus with other circulatory complications: Secondary | ICD-10-CM | POA: Diagnosis not present

## 2017-07-15 DIAGNOSIS — I519 Heart disease, unspecified: Secondary | ICD-10-CM | POA: Diagnosis not present

## 2017-07-18 DIAGNOSIS — I359 Nonrheumatic aortic valve disorder, unspecified: Secondary | ICD-10-CM | POA: Diagnosis not present

## 2017-07-18 DIAGNOSIS — E1159 Type 2 diabetes mellitus with other circulatory complications: Secondary | ICD-10-CM | POA: Diagnosis not present

## 2017-07-18 DIAGNOSIS — I519 Heart disease, unspecified: Secondary | ICD-10-CM | POA: Diagnosis not present

## 2017-07-18 DIAGNOSIS — I5030 Unspecified diastolic (congestive) heart failure: Secondary | ICD-10-CM | POA: Diagnosis not present

## 2017-07-18 DIAGNOSIS — I4891 Unspecified atrial fibrillation: Secondary | ICD-10-CM | POA: Diagnosis not present

## 2017-07-18 DIAGNOSIS — N183 Chronic kidney disease, stage 3 (moderate): Secondary | ICD-10-CM | POA: Diagnosis not present

## 2017-07-19 DIAGNOSIS — N183 Chronic kidney disease, stage 3 (moderate): Secondary | ICD-10-CM | POA: Diagnosis not present

## 2017-07-19 DIAGNOSIS — I359 Nonrheumatic aortic valve disorder, unspecified: Secondary | ICD-10-CM | POA: Diagnosis not present

## 2017-07-19 DIAGNOSIS — I4891 Unspecified atrial fibrillation: Secondary | ICD-10-CM | POA: Diagnosis not present

## 2017-07-19 DIAGNOSIS — I5030 Unspecified diastolic (congestive) heart failure: Secondary | ICD-10-CM | POA: Diagnosis not present

## 2017-07-19 DIAGNOSIS — E1159 Type 2 diabetes mellitus with other circulatory complications: Secondary | ICD-10-CM | POA: Diagnosis not present

## 2017-07-19 DIAGNOSIS — I519 Heart disease, unspecified: Secondary | ICD-10-CM | POA: Diagnosis not present

## 2017-07-22 DIAGNOSIS — N183 Chronic kidney disease, stage 3 (moderate): Secondary | ICD-10-CM | POA: Diagnosis not present

## 2017-07-22 DIAGNOSIS — I359 Nonrheumatic aortic valve disorder, unspecified: Secondary | ICD-10-CM | POA: Diagnosis not present

## 2017-07-22 DIAGNOSIS — I519 Heart disease, unspecified: Secondary | ICD-10-CM | POA: Diagnosis not present

## 2017-07-22 DIAGNOSIS — I5030 Unspecified diastolic (congestive) heart failure: Secondary | ICD-10-CM | POA: Diagnosis not present

## 2017-07-22 DIAGNOSIS — E1159 Type 2 diabetes mellitus with other circulatory complications: Secondary | ICD-10-CM | POA: Diagnosis not present

## 2017-07-22 DIAGNOSIS — I4891 Unspecified atrial fibrillation: Secondary | ICD-10-CM | POA: Diagnosis not present

## 2017-07-25 DIAGNOSIS — E1159 Type 2 diabetes mellitus with other circulatory complications: Secondary | ICD-10-CM | POA: Diagnosis not present

## 2017-07-25 DIAGNOSIS — I4891 Unspecified atrial fibrillation: Secondary | ICD-10-CM | POA: Diagnosis not present

## 2017-07-25 DIAGNOSIS — N183 Chronic kidney disease, stage 3 (moderate): Secondary | ICD-10-CM | POA: Diagnosis not present

## 2017-07-25 DIAGNOSIS — I5030 Unspecified diastolic (congestive) heart failure: Secondary | ICD-10-CM | POA: Diagnosis not present

## 2017-07-25 DIAGNOSIS — I519 Heart disease, unspecified: Secondary | ICD-10-CM | POA: Diagnosis not present

## 2017-07-25 DIAGNOSIS — I359 Nonrheumatic aortic valve disorder, unspecified: Secondary | ICD-10-CM | POA: Diagnosis not present

## 2017-07-26 DIAGNOSIS — I4891 Unspecified atrial fibrillation: Secondary | ICD-10-CM | POA: Diagnosis not present

## 2017-07-26 DIAGNOSIS — I359 Nonrheumatic aortic valve disorder, unspecified: Secondary | ICD-10-CM | POA: Diagnosis not present

## 2017-07-26 DIAGNOSIS — I519 Heart disease, unspecified: Secondary | ICD-10-CM | POA: Diagnosis not present

## 2017-07-26 DIAGNOSIS — N183 Chronic kidney disease, stage 3 (moderate): Secondary | ICD-10-CM | POA: Diagnosis not present

## 2017-07-26 DIAGNOSIS — E1159 Type 2 diabetes mellitus with other circulatory complications: Secondary | ICD-10-CM | POA: Diagnosis not present

## 2017-07-26 DIAGNOSIS — I5030 Unspecified diastolic (congestive) heart failure: Secondary | ICD-10-CM | POA: Diagnosis not present

## 2017-07-29 DIAGNOSIS — I4891 Unspecified atrial fibrillation: Secondary | ICD-10-CM | POA: Diagnosis not present

## 2017-07-29 DIAGNOSIS — E1159 Type 2 diabetes mellitus with other circulatory complications: Secondary | ICD-10-CM | POA: Diagnosis not present

## 2017-07-29 DIAGNOSIS — I359 Nonrheumatic aortic valve disorder, unspecified: Secondary | ICD-10-CM | POA: Diagnosis not present

## 2017-07-29 DIAGNOSIS — I5030 Unspecified diastolic (congestive) heart failure: Secondary | ICD-10-CM | POA: Diagnosis not present

## 2017-07-29 DIAGNOSIS — N183 Chronic kidney disease, stage 3 (moderate): Secondary | ICD-10-CM | POA: Diagnosis not present

## 2017-07-29 DIAGNOSIS — I519 Heart disease, unspecified: Secondary | ICD-10-CM | POA: Diagnosis not present

## 2017-07-30 DIAGNOSIS — I519 Heart disease, unspecified: Secondary | ICD-10-CM | POA: Diagnosis not present

## 2017-07-30 DIAGNOSIS — I4891 Unspecified atrial fibrillation: Secondary | ICD-10-CM | POA: Diagnosis not present

## 2017-07-30 DIAGNOSIS — N183 Chronic kidney disease, stage 3 (moderate): Secondary | ICD-10-CM | POA: Diagnosis not present

## 2017-07-30 DIAGNOSIS — I359 Nonrheumatic aortic valve disorder, unspecified: Secondary | ICD-10-CM | POA: Diagnosis not present

## 2017-07-30 DIAGNOSIS — I5030 Unspecified diastolic (congestive) heart failure: Secondary | ICD-10-CM | POA: Diagnosis not present

## 2017-07-30 DIAGNOSIS — E1159 Type 2 diabetes mellitus with other circulatory complications: Secondary | ICD-10-CM | POA: Diagnosis not present

## 2017-08-01 DIAGNOSIS — I4891 Unspecified atrial fibrillation: Secondary | ICD-10-CM | POA: Diagnosis not present

## 2017-08-01 DIAGNOSIS — I519 Heart disease, unspecified: Secondary | ICD-10-CM | POA: Diagnosis not present

## 2017-08-01 DIAGNOSIS — E1159 Type 2 diabetes mellitus with other circulatory complications: Secondary | ICD-10-CM | POA: Diagnosis not present

## 2017-08-01 DIAGNOSIS — I5030 Unspecified diastolic (congestive) heart failure: Secondary | ICD-10-CM | POA: Diagnosis not present

## 2017-08-01 DIAGNOSIS — N183 Chronic kidney disease, stage 3 (moderate): Secondary | ICD-10-CM | POA: Diagnosis not present

## 2017-08-01 DIAGNOSIS — I359 Nonrheumatic aortic valve disorder, unspecified: Secondary | ICD-10-CM | POA: Diagnosis not present

## 2017-08-02 DIAGNOSIS — N183 Chronic kidney disease, stage 3 (moderate): Secondary | ICD-10-CM | POA: Diagnosis not present

## 2017-08-02 DIAGNOSIS — E039 Hypothyroidism, unspecified: Secondary | ICD-10-CM | POA: Diagnosis not present

## 2017-08-02 DIAGNOSIS — I10 Essential (primary) hypertension: Secondary | ICD-10-CM | POA: Diagnosis not present

## 2017-08-02 DIAGNOSIS — Z8551 Personal history of malignant neoplasm of bladder: Secondary | ICD-10-CM | POA: Diagnosis not present

## 2017-08-02 DIAGNOSIS — E1159 Type 2 diabetes mellitus with other circulatory complications: Secondary | ICD-10-CM | POA: Diagnosis not present

## 2017-08-02 DIAGNOSIS — R0902 Hypoxemia: Secondary | ICD-10-CM | POA: Diagnosis not present

## 2017-08-02 DIAGNOSIS — I519 Heart disease, unspecified: Secondary | ICD-10-CM | POA: Diagnosis not present

## 2017-08-02 DIAGNOSIS — I4891 Unspecified atrial fibrillation: Secondary | ICD-10-CM | POA: Diagnosis not present

## 2017-08-02 DIAGNOSIS — Z85038 Personal history of other malignant neoplasm of large intestine: Secondary | ICD-10-CM | POA: Diagnosis not present

## 2017-08-02 DIAGNOSIS — L209 Atopic dermatitis, unspecified: Secondary | ICD-10-CM | POA: Diagnosis not present

## 2017-08-02 DIAGNOSIS — I359 Nonrheumatic aortic valve disorder, unspecified: Secondary | ICD-10-CM | POA: Diagnosis not present

## 2017-08-02 DIAGNOSIS — Z853 Personal history of malignant neoplasm of breast: Secondary | ICD-10-CM | POA: Diagnosis not present

## 2017-08-02 DIAGNOSIS — E785 Hyperlipidemia, unspecified: Secondary | ICD-10-CM | POA: Diagnosis not present

## 2017-08-02 DIAGNOSIS — I5030 Unspecified diastolic (congestive) heart failure: Secondary | ICD-10-CM | POA: Diagnosis not present

## 2017-08-05 DIAGNOSIS — I5030 Unspecified diastolic (congestive) heart failure: Secondary | ICD-10-CM | POA: Diagnosis not present

## 2017-08-05 DIAGNOSIS — I519 Heart disease, unspecified: Secondary | ICD-10-CM | POA: Diagnosis not present

## 2017-08-05 DIAGNOSIS — I4891 Unspecified atrial fibrillation: Secondary | ICD-10-CM | POA: Diagnosis not present

## 2017-08-05 DIAGNOSIS — E1159 Type 2 diabetes mellitus with other circulatory complications: Secondary | ICD-10-CM | POA: Diagnosis not present

## 2017-08-05 DIAGNOSIS — I359 Nonrheumatic aortic valve disorder, unspecified: Secondary | ICD-10-CM | POA: Diagnosis not present

## 2017-08-05 DIAGNOSIS — N183 Chronic kidney disease, stage 3 (moderate): Secondary | ICD-10-CM | POA: Diagnosis not present

## 2017-08-06 DIAGNOSIS — I4891 Unspecified atrial fibrillation: Secondary | ICD-10-CM | POA: Diagnosis not present

## 2017-08-06 DIAGNOSIS — E1159 Type 2 diabetes mellitus with other circulatory complications: Secondary | ICD-10-CM | POA: Diagnosis not present

## 2017-08-06 DIAGNOSIS — I519 Heart disease, unspecified: Secondary | ICD-10-CM | POA: Diagnosis not present

## 2017-08-06 DIAGNOSIS — I359 Nonrheumatic aortic valve disorder, unspecified: Secondary | ICD-10-CM | POA: Diagnosis not present

## 2017-08-06 DIAGNOSIS — I5030 Unspecified diastolic (congestive) heart failure: Secondary | ICD-10-CM | POA: Diagnosis not present

## 2017-08-06 DIAGNOSIS — N183 Chronic kidney disease, stage 3 (moderate): Secondary | ICD-10-CM | POA: Diagnosis not present

## 2017-08-08 DIAGNOSIS — I5030 Unspecified diastolic (congestive) heart failure: Secondary | ICD-10-CM | POA: Diagnosis not present

## 2017-08-08 DIAGNOSIS — I359 Nonrheumatic aortic valve disorder, unspecified: Secondary | ICD-10-CM | POA: Diagnosis not present

## 2017-08-08 DIAGNOSIS — I4891 Unspecified atrial fibrillation: Secondary | ICD-10-CM | POA: Diagnosis not present

## 2017-08-08 DIAGNOSIS — E1159 Type 2 diabetes mellitus with other circulatory complications: Secondary | ICD-10-CM | POA: Diagnosis not present

## 2017-08-08 DIAGNOSIS — N183 Chronic kidney disease, stage 3 (moderate): Secondary | ICD-10-CM | POA: Diagnosis not present

## 2017-08-08 DIAGNOSIS — I519 Heart disease, unspecified: Secondary | ICD-10-CM | POA: Diagnosis not present

## 2017-08-12 DIAGNOSIS — I519 Heart disease, unspecified: Secondary | ICD-10-CM | POA: Diagnosis not present

## 2017-08-12 DIAGNOSIS — I4891 Unspecified atrial fibrillation: Secondary | ICD-10-CM | POA: Diagnosis not present

## 2017-08-12 DIAGNOSIS — N183 Chronic kidney disease, stage 3 (moderate): Secondary | ICD-10-CM | POA: Diagnosis not present

## 2017-08-12 DIAGNOSIS — E1159 Type 2 diabetes mellitus with other circulatory complications: Secondary | ICD-10-CM | POA: Diagnosis not present

## 2017-08-12 DIAGNOSIS — I359 Nonrheumatic aortic valve disorder, unspecified: Secondary | ICD-10-CM | POA: Diagnosis not present

## 2017-08-12 DIAGNOSIS — I5030 Unspecified diastolic (congestive) heart failure: Secondary | ICD-10-CM | POA: Diagnosis not present

## 2017-08-13 DIAGNOSIS — I359 Nonrheumatic aortic valve disorder, unspecified: Secondary | ICD-10-CM | POA: Diagnosis not present

## 2017-08-13 DIAGNOSIS — I519 Heart disease, unspecified: Secondary | ICD-10-CM | POA: Diagnosis not present

## 2017-08-13 DIAGNOSIS — I5030 Unspecified diastolic (congestive) heart failure: Secondary | ICD-10-CM | POA: Diagnosis not present

## 2017-08-13 DIAGNOSIS — E1159 Type 2 diabetes mellitus with other circulatory complications: Secondary | ICD-10-CM | POA: Diagnosis not present

## 2017-08-13 DIAGNOSIS — N183 Chronic kidney disease, stage 3 (moderate): Secondary | ICD-10-CM | POA: Diagnosis not present

## 2017-08-13 DIAGNOSIS — I4891 Unspecified atrial fibrillation: Secondary | ICD-10-CM | POA: Diagnosis not present

## 2017-08-15 DIAGNOSIS — I5030 Unspecified diastolic (congestive) heart failure: Secondary | ICD-10-CM | POA: Diagnosis not present

## 2017-08-15 DIAGNOSIS — I4891 Unspecified atrial fibrillation: Secondary | ICD-10-CM | POA: Diagnosis not present

## 2017-08-15 DIAGNOSIS — E1159 Type 2 diabetes mellitus with other circulatory complications: Secondary | ICD-10-CM | POA: Diagnosis not present

## 2017-08-15 DIAGNOSIS — I359 Nonrheumatic aortic valve disorder, unspecified: Secondary | ICD-10-CM | POA: Diagnosis not present

## 2017-08-15 DIAGNOSIS — N183 Chronic kidney disease, stage 3 (moderate): Secondary | ICD-10-CM | POA: Diagnosis not present

## 2017-08-15 DIAGNOSIS — I519 Heart disease, unspecified: Secondary | ICD-10-CM | POA: Diagnosis not present

## 2017-08-16 DIAGNOSIS — I4891 Unspecified atrial fibrillation: Secondary | ICD-10-CM | POA: Diagnosis not present

## 2017-08-16 DIAGNOSIS — N183 Chronic kidney disease, stage 3 (moderate): Secondary | ICD-10-CM | POA: Diagnosis not present

## 2017-08-16 DIAGNOSIS — I519 Heart disease, unspecified: Secondary | ICD-10-CM | POA: Diagnosis not present

## 2017-08-16 DIAGNOSIS — I5030 Unspecified diastolic (congestive) heart failure: Secondary | ICD-10-CM | POA: Diagnosis not present

## 2017-08-16 DIAGNOSIS — E1159 Type 2 diabetes mellitus with other circulatory complications: Secondary | ICD-10-CM | POA: Diagnosis not present

## 2017-08-16 DIAGNOSIS — I359 Nonrheumatic aortic valve disorder, unspecified: Secondary | ICD-10-CM | POA: Diagnosis not present

## 2017-08-19 DIAGNOSIS — E1159 Type 2 diabetes mellitus with other circulatory complications: Secondary | ICD-10-CM | POA: Diagnosis not present

## 2017-08-19 DIAGNOSIS — I359 Nonrheumatic aortic valve disorder, unspecified: Secondary | ICD-10-CM | POA: Diagnosis not present

## 2017-08-19 DIAGNOSIS — I4891 Unspecified atrial fibrillation: Secondary | ICD-10-CM | POA: Diagnosis not present

## 2017-08-19 DIAGNOSIS — I519 Heart disease, unspecified: Secondary | ICD-10-CM | POA: Diagnosis not present

## 2017-08-19 DIAGNOSIS — N183 Chronic kidney disease, stage 3 (moderate): Secondary | ICD-10-CM | POA: Diagnosis not present

## 2017-08-19 DIAGNOSIS — I5030 Unspecified diastolic (congestive) heart failure: Secondary | ICD-10-CM | POA: Diagnosis not present

## 2017-08-20 DIAGNOSIS — I359 Nonrheumatic aortic valve disorder, unspecified: Secondary | ICD-10-CM | POA: Diagnosis not present

## 2017-08-20 DIAGNOSIS — N183 Chronic kidney disease, stage 3 (moderate): Secondary | ICD-10-CM | POA: Diagnosis not present

## 2017-08-20 DIAGNOSIS — E1159 Type 2 diabetes mellitus with other circulatory complications: Secondary | ICD-10-CM | POA: Diagnosis not present

## 2017-08-20 DIAGNOSIS — I4891 Unspecified atrial fibrillation: Secondary | ICD-10-CM | POA: Diagnosis not present

## 2017-08-20 DIAGNOSIS — I5030 Unspecified diastolic (congestive) heart failure: Secondary | ICD-10-CM | POA: Diagnosis not present

## 2017-08-20 DIAGNOSIS — I519 Heart disease, unspecified: Secondary | ICD-10-CM | POA: Diagnosis not present

## 2017-08-22 DIAGNOSIS — I4891 Unspecified atrial fibrillation: Secondary | ICD-10-CM | POA: Diagnosis not present

## 2017-08-22 DIAGNOSIS — I5030 Unspecified diastolic (congestive) heart failure: Secondary | ICD-10-CM | POA: Diagnosis not present

## 2017-08-22 DIAGNOSIS — E1159 Type 2 diabetes mellitus with other circulatory complications: Secondary | ICD-10-CM | POA: Diagnosis not present

## 2017-08-22 DIAGNOSIS — I519 Heart disease, unspecified: Secondary | ICD-10-CM | POA: Diagnosis not present

## 2017-08-22 DIAGNOSIS — N183 Chronic kidney disease, stage 3 (moderate): Secondary | ICD-10-CM | POA: Diagnosis not present

## 2017-08-22 DIAGNOSIS — I359 Nonrheumatic aortic valve disorder, unspecified: Secondary | ICD-10-CM | POA: Diagnosis not present

## 2017-08-26 DIAGNOSIS — N183 Chronic kidney disease, stage 3 (moderate): Secondary | ICD-10-CM | POA: Diagnosis not present

## 2017-08-26 DIAGNOSIS — I5030 Unspecified diastolic (congestive) heart failure: Secondary | ICD-10-CM | POA: Diagnosis not present

## 2017-08-26 DIAGNOSIS — I4891 Unspecified atrial fibrillation: Secondary | ICD-10-CM | POA: Diagnosis not present

## 2017-08-26 DIAGNOSIS — I359 Nonrheumatic aortic valve disorder, unspecified: Secondary | ICD-10-CM | POA: Diagnosis not present

## 2017-08-26 DIAGNOSIS — E1159 Type 2 diabetes mellitus with other circulatory complications: Secondary | ICD-10-CM | POA: Diagnosis not present

## 2017-08-26 DIAGNOSIS — I519 Heart disease, unspecified: Secondary | ICD-10-CM | POA: Diagnosis not present

## 2017-08-27 DIAGNOSIS — I519 Heart disease, unspecified: Secondary | ICD-10-CM | POA: Diagnosis not present

## 2017-08-27 DIAGNOSIS — I359 Nonrheumatic aortic valve disorder, unspecified: Secondary | ICD-10-CM | POA: Diagnosis not present

## 2017-08-27 DIAGNOSIS — I5030 Unspecified diastolic (congestive) heart failure: Secondary | ICD-10-CM | POA: Diagnosis not present

## 2017-08-27 DIAGNOSIS — I4891 Unspecified atrial fibrillation: Secondary | ICD-10-CM | POA: Diagnosis not present

## 2017-08-27 DIAGNOSIS — N183 Chronic kidney disease, stage 3 (moderate): Secondary | ICD-10-CM | POA: Diagnosis not present

## 2017-08-27 DIAGNOSIS — E1159 Type 2 diabetes mellitus with other circulatory complications: Secondary | ICD-10-CM | POA: Diagnosis not present

## 2017-08-29 DIAGNOSIS — I359 Nonrheumatic aortic valve disorder, unspecified: Secondary | ICD-10-CM | POA: Diagnosis not present

## 2017-08-29 DIAGNOSIS — N183 Chronic kidney disease, stage 3 (moderate): Secondary | ICD-10-CM | POA: Diagnosis not present

## 2017-08-29 DIAGNOSIS — I5030 Unspecified diastolic (congestive) heart failure: Secondary | ICD-10-CM | POA: Diagnosis not present

## 2017-08-29 DIAGNOSIS — I4891 Unspecified atrial fibrillation: Secondary | ICD-10-CM | POA: Diagnosis not present

## 2017-08-29 DIAGNOSIS — E1159 Type 2 diabetes mellitus with other circulatory complications: Secondary | ICD-10-CM | POA: Diagnosis not present

## 2017-08-29 DIAGNOSIS — I519 Heart disease, unspecified: Secondary | ICD-10-CM | POA: Diagnosis not present

## 2017-08-30 DIAGNOSIS — Z853 Personal history of malignant neoplasm of breast: Secondary | ICD-10-CM | POA: Diagnosis not present

## 2017-08-30 DIAGNOSIS — R0902 Hypoxemia: Secondary | ICD-10-CM | POA: Diagnosis not present

## 2017-08-30 DIAGNOSIS — Z85038 Personal history of other malignant neoplasm of large intestine: Secondary | ICD-10-CM | POA: Diagnosis not present

## 2017-08-30 DIAGNOSIS — I5030 Unspecified diastolic (congestive) heart failure: Secondary | ICD-10-CM | POA: Diagnosis not present

## 2017-08-30 DIAGNOSIS — E1159 Type 2 diabetes mellitus with other circulatory complications: Secondary | ICD-10-CM | POA: Diagnosis not present

## 2017-08-30 DIAGNOSIS — N183 Chronic kidney disease, stage 3 (moderate): Secondary | ICD-10-CM | POA: Diagnosis not present

## 2017-08-30 DIAGNOSIS — I359 Nonrheumatic aortic valve disorder, unspecified: Secondary | ICD-10-CM | POA: Diagnosis not present

## 2017-08-30 DIAGNOSIS — L209 Atopic dermatitis, unspecified: Secondary | ICD-10-CM | POA: Diagnosis not present

## 2017-08-30 DIAGNOSIS — E039 Hypothyroidism, unspecified: Secondary | ICD-10-CM | POA: Diagnosis not present

## 2017-08-30 DIAGNOSIS — I519 Heart disease, unspecified: Secondary | ICD-10-CM | POA: Diagnosis not present

## 2017-08-30 DIAGNOSIS — Z8551 Personal history of malignant neoplasm of bladder: Secondary | ICD-10-CM | POA: Diagnosis not present

## 2017-08-30 DIAGNOSIS — I10 Essential (primary) hypertension: Secondary | ICD-10-CM | POA: Diagnosis not present

## 2017-08-30 DIAGNOSIS — E785 Hyperlipidemia, unspecified: Secondary | ICD-10-CM | POA: Diagnosis not present

## 2017-08-30 DIAGNOSIS — I4891 Unspecified atrial fibrillation: Secondary | ICD-10-CM | POA: Diagnosis not present

## 2017-09-02 DIAGNOSIS — I5030 Unspecified diastolic (congestive) heart failure: Secondary | ICD-10-CM | POA: Diagnosis not present

## 2017-09-02 DIAGNOSIS — I519 Heart disease, unspecified: Secondary | ICD-10-CM | POA: Diagnosis not present

## 2017-09-02 DIAGNOSIS — N183 Chronic kidney disease, stage 3 (moderate): Secondary | ICD-10-CM | POA: Diagnosis not present

## 2017-09-02 DIAGNOSIS — E1159 Type 2 diabetes mellitus with other circulatory complications: Secondary | ICD-10-CM | POA: Diagnosis not present

## 2017-09-02 DIAGNOSIS — I4891 Unspecified atrial fibrillation: Secondary | ICD-10-CM | POA: Diagnosis not present

## 2017-09-02 DIAGNOSIS — I359 Nonrheumatic aortic valve disorder, unspecified: Secondary | ICD-10-CM | POA: Diagnosis not present

## 2017-09-04 DIAGNOSIS — I519 Heart disease, unspecified: Secondary | ICD-10-CM | POA: Diagnosis not present

## 2017-09-04 DIAGNOSIS — E1159 Type 2 diabetes mellitus with other circulatory complications: Secondary | ICD-10-CM | POA: Diagnosis not present

## 2017-09-04 DIAGNOSIS — I4891 Unspecified atrial fibrillation: Secondary | ICD-10-CM | POA: Diagnosis not present

## 2017-09-04 DIAGNOSIS — I5030 Unspecified diastolic (congestive) heart failure: Secondary | ICD-10-CM | POA: Diagnosis not present

## 2017-09-04 DIAGNOSIS — N183 Chronic kidney disease, stage 3 (moderate): Secondary | ICD-10-CM | POA: Diagnosis not present

## 2017-09-04 DIAGNOSIS — I359 Nonrheumatic aortic valve disorder, unspecified: Secondary | ICD-10-CM | POA: Diagnosis not present

## 2017-09-05 DIAGNOSIS — E1159 Type 2 diabetes mellitus with other circulatory complications: Secondary | ICD-10-CM | POA: Diagnosis not present

## 2017-09-05 DIAGNOSIS — I359 Nonrheumatic aortic valve disorder, unspecified: Secondary | ICD-10-CM | POA: Diagnosis not present

## 2017-09-05 DIAGNOSIS — I5030 Unspecified diastolic (congestive) heart failure: Secondary | ICD-10-CM | POA: Diagnosis not present

## 2017-09-05 DIAGNOSIS — I519 Heart disease, unspecified: Secondary | ICD-10-CM | POA: Diagnosis not present

## 2017-09-05 DIAGNOSIS — I4891 Unspecified atrial fibrillation: Secondary | ICD-10-CM | POA: Diagnosis not present

## 2017-09-05 DIAGNOSIS — N183 Chronic kidney disease, stage 3 (moderate): Secondary | ICD-10-CM | POA: Diagnosis not present

## 2017-09-06 DIAGNOSIS — I519 Heart disease, unspecified: Secondary | ICD-10-CM | POA: Diagnosis not present

## 2017-09-06 DIAGNOSIS — I4891 Unspecified atrial fibrillation: Secondary | ICD-10-CM | POA: Diagnosis not present

## 2017-09-06 DIAGNOSIS — I359 Nonrheumatic aortic valve disorder, unspecified: Secondary | ICD-10-CM | POA: Diagnosis not present

## 2017-09-06 DIAGNOSIS — E1159 Type 2 diabetes mellitus with other circulatory complications: Secondary | ICD-10-CM | POA: Diagnosis not present

## 2017-09-06 DIAGNOSIS — I5030 Unspecified diastolic (congestive) heart failure: Secondary | ICD-10-CM | POA: Diagnosis not present

## 2017-09-06 DIAGNOSIS — N183 Chronic kidney disease, stage 3 (moderate): Secondary | ICD-10-CM | POA: Diagnosis not present

## 2017-09-09 DIAGNOSIS — I4891 Unspecified atrial fibrillation: Secondary | ICD-10-CM | POA: Diagnosis not present

## 2017-09-09 DIAGNOSIS — I519 Heart disease, unspecified: Secondary | ICD-10-CM | POA: Diagnosis not present

## 2017-09-09 DIAGNOSIS — N183 Chronic kidney disease, stage 3 (moderate): Secondary | ICD-10-CM | POA: Diagnosis not present

## 2017-09-09 DIAGNOSIS — I5030 Unspecified diastolic (congestive) heart failure: Secondary | ICD-10-CM | POA: Diagnosis not present

## 2017-09-09 DIAGNOSIS — E1159 Type 2 diabetes mellitus with other circulatory complications: Secondary | ICD-10-CM | POA: Diagnosis not present

## 2017-09-09 DIAGNOSIS — I359 Nonrheumatic aortic valve disorder, unspecified: Secondary | ICD-10-CM | POA: Diagnosis not present

## 2017-09-10 DIAGNOSIS — N183 Chronic kidney disease, stage 3 (moderate): Secondary | ICD-10-CM | POA: Diagnosis not present

## 2017-09-10 DIAGNOSIS — I359 Nonrheumatic aortic valve disorder, unspecified: Secondary | ICD-10-CM | POA: Diagnosis not present

## 2017-09-10 DIAGNOSIS — E1159 Type 2 diabetes mellitus with other circulatory complications: Secondary | ICD-10-CM | POA: Diagnosis not present

## 2017-09-10 DIAGNOSIS — I5030 Unspecified diastolic (congestive) heart failure: Secondary | ICD-10-CM | POA: Diagnosis not present

## 2017-09-10 DIAGNOSIS — I519 Heart disease, unspecified: Secondary | ICD-10-CM | POA: Diagnosis not present

## 2017-09-10 DIAGNOSIS — I4891 Unspecified atrial fibrillation: Secondary | ICD-10-CM | POA: Diagnosis not present

## 2017-09-12 DIAGNOSIS — E1159 Type 2 diabetes mellitus with other circulatory complications: Secondary | ICD-10-CM | POA: Diagnosis not present

## 2017-09-12 DIAGNOSIS — I5030 Unspecified diastolic (congestive) heart failure: Secondary | ICD-10-CM | POA: Diagnosis not present

## 2017-09-12 DIAGNOSIS — I519 Heart disease, unspecified: Secondary | ICD-10-CM | POA: Diagnosis not present

## 2017-09-12 DIAGNOSIS — N183 Chronic kidney disease, stage 3 (moderate): Secondary | ICD-10-CM | POA: Diagnosis not present

## 2017-09-12 DIAGNOSIS — I4891 Unspecified atrial fibrillation: Secondary | ICD-10-CM | POA: Diagnosis not present

## 2017-09-12 DIAGNOSIS — I359 Nonrheumatic aortic valve disorder, unspecified: Secondary | ICD-10-CM | POA: Diagnosis not present

## 2017-09-13 DIAGNOSIS — N183 Chronic kidney disease, stage 3 (moderate): Secondary | ICD-10-CM | POA: Diagnosis not present

## 2017-09-13 DIAGNOSIS — I359 Nonrheumatic aortic valve disorder, unspecified: Secondary | ICD-10-CM | POA: Diagnosis not present

## 2017-09-13 DIAGNOSIS — E1159 Type 2 diabetes mellitus with other circulatory complications: Secondary | ICD-10-CM | POA: Diagnosis not present

## 2017-09-13 DIAGNOSIS — I5030 Unspecified diastolic (congestive) heart failure: Secondary | ICD-10-CM | POA: Diagnosis not present

## 2017-09-13 DIAGNOSIS — I4891 Unspecified atrial fibrillation: Secondary | ICD-10-CM | POA: Diagnosis not present

## 2017-09-13 DIAGNOSIS — I519 Heart disease, unspecified: Secondary | ICD-10-CM | POA: Diagnosis not present

## 2017-09-16 DIAGNOSIS — N183 Chronic kidney disease, stage 3 (moderate): Secondary | ICD-10-CM | POA: Diagnosis not present

## 2017-09-16 DIAGNOSIS — I5030 Unspecified diastolic (congestive) heart failure: Secondary | ICD-10-CM | POA: Diagnosis not present

## 2017-09-16 DIAGNOSIS — Q845 Enlarged and hypertrophic nails: Secondary | ICD-10-CM | POA: Diagnosis not present

## 2017-09-16 DIAGNOSIS — E1159 Type 2 diabetes mellitus with other circulatory complications: Secondary | ICD-10-CM | POA: Diagnosis not present

## 2017-09-16 DIAGNOSIS — I519 Heart disease, unspecified: Secondary | ICD-10-CM | POA: Diagnosis not present

## 2017-09-16 DIAGNOSIS — I359 Nonrheumatic aortic valve disorder, unspecified: Secondary | ICD-10-CM | POA: Diagnosis not present

## 2017-09-16 DIAGNOSIS — L84 Corns and callosities: Secondary | ICD-10-CM | POA: Diagnosis not present

## 2017-09-16 DIAGNOSIS — I4891 Unspecified atrial fibrillation: Secondary | ICD-10-CM | POA: Diagnosis not present

## 2017-09-16 DIAGNOSIS — I739 Peripheral vascular disease, unspecified: Secondary | ICD-10-CM | POA: Diagnosis not present

## 2017-09-16 DIAGNOSIS — L603 Nail dystrophy: Secondary | ICD-10-CM | POA: Diagnosis not present

## 2017-09-17 DIAGNOSIS — E1159 Type 2 diabetes mellitus with other circulatory complications: Secondary | ICD-10-CM | POA: Diagnosis not present

## 2017-09-17 DIAGNOSIS — I4891 Unspecified atrial fibrillation: Secondary | ICD-10-CM | POA: Diagnosis not present

## 2017-09-17 DIAGNOSIS — I5030 Unspecified diastolic (congestive) heart failure: Secondary | ICD-10-CM | POA: Diagnosis not present

## 2017-09-17 DIAGNOSIS — N183 Chronic kidney disease, stage 3 (moderate): Secondary | ICD-10-CM | POA: Diagnosis not present

## 2017-09-17 DIAGNOSIS — I359 Nonrheumatic aortic valve disorder, unspecified: Secondary | ICD-10-CM | POA: Diagnosis not present

## 2017-09-17 DIAGNOSIS — I519 Heart disease, unspecified: Secondary | ICD-10-CM | POA: Diagnosis not present

## 2017-09-19 DIAGNOSIS — I359 Nonrheumatic aortic valve disorder, unspecified: Secondary | ICD-10-CM | POA: Diagnosis not present

## 2017-09-19 DIAGNOSIS — I4891 Unspecified atrial fibrillation: Secondary | ICD-10-CM | POA: Diagnosis not present

## 2017-09-19 DIAGNOSIS — I519 Heart disease, unspecified: Secondary | ICD-10-CM | POA: Diagnosis not present

## 2017-09-19 DIAGNOSIS — I5030 Unspecified diastolic (congestive) heart failure: Secondary | ICD-10-CM | POA: Diagnosis not present

## 2017-09-19 DIAGNOSIS — N183 Chronic kidney disease, stage 3 (moderate): Secondary | ICD-10-CM | POA: Diagnosis not present

## 2017-09-19 DIAGNOSIS — E1159 Type 2 diabetes mellitus with other circulatory complications: Secondary | ICD-10-CM | POA: Diagnosis not present

## 2017-09-23 DIAGNOSIS — I359 Nonrheumatic aortic valve disorder, unspecified: Secondary | ICD-10-CM | POA: Diagnosis not present

## 2017-09-23 DIAGNOSIS — E1159 Type 2 diabetes mellitus with other circulatory complications: Secondary | ICD-10-CM | POA: Diagnosis not present

## 2017-09-23 DIAGNOSIS — I4891 Unspecified atrial fibrillation: Secondary | ICD-10-CM | POA: Diagnosis not present

## 2017-09-23 DIAGNOSIS — I5030 Unspecified diastolic (congestive) heart failure: Secondary | ICD-10-CM | POA: Diagnosis not present

## 2017-09-23 DIAGNOSIS — I519 Heart disease, unspecified: Secondary | ICD-10-CM | POA: Diagnosis not present

## 2017-09-23 DIAGNOSIS — N183 Chronic kidney disease, stage 3 (moderate): Secondary | ICD-10-CM | POA: Diagnosis not present

## 2017-09-24 DIAGNOSIS — E1159 Type 2 diabetes mellitus with other circulatory complications: Secondary | ICD-10-CM | POA: Diagnosis not present

## 2017-09-24 DIAGNOSIS — I5030 Unspecified diastolic (congestive) heart failure: Secondary | ICD-10-CM | POA: Diagnosis not present

## 2017-09-24 DIAGNOSIS — I4891 Unspecified atrial fibrillation: Secondary | ICD-10-CM | POA: Diagnosis not present

## 2017-09-24 DIAGNOSIS — I359 Nonrheumatic aortic valve disorder, unspecified: Secondary | ICD-10-CM | POA: Diagnosis not present

## 2017-09-24 DIAGNOSIS — N183 Chronic kidney disease, stage 3 (moderate): Secondary | ICD-10-CM | POA: Diagnosis not present

## 2017-09-24 DIAGNOSIS — I519 Heart disease, unspecified: Secondary | ICD-10-CM | POA: Diagnosis not present

## 2017-09-26 DIAGNOSIS — I519 Heart disease, unspecified: Secondary | ICD-10-CM | POA: Diagnosis not present

## 2017-09-26 DIAGNOSIS — I4891 Unspecified atrial fibrillation: Secondary | ICD-10-CM | POA: Diagnosis not present

## 2017-09-26 DIAGNOSIS — N183 Chronic kidney disease, stage 3 (moderate): Secondary | ICD-10-CM | POA: Diagnosis not present

## 2017-09-26 DIAGNOSIS — I359 Nonrheumatic aortic valve disorder, unspecified: Secondary | ICD-10-CM | POA: Diagnosis not present

## 2017-09-26 DIAGNOSIS — E1159 Type 2 diabetes mellitus with other circulatory complications: Secondary | ICD-10-CM | POA: Diagnosis not present

## 2017-09-26 DIAGNOSIS — I5030 Unspecified diastolic (congestive) heart failure: Secondary | ICD-10-CM | POA: Diagnosis not present

## 2017-09-27 DIAGNOSIS — N183 Chronic kidney disease, stage 3 (moderate): Secondary | ICD-10-CM | POA: Diagnosis not present

## 2017-09-27 DIAGNOSIS — E1159 Type 2 diabetes mellitus with other circulatory complications: Secondary | ICD-10-CM | POA: Diagnosis not present

## 2017-09-27 DIAGNOSIS — I359 Nonrheumatic aortic valve disorder, unspecified: Secondary | ICD-10-CM | POA: Diagnosis not present

## 2017-09-27 DIAGNOSIS — I4891 Unspecified atrial fibrillation: Secondary | ICD-10-CM | POA: Diagnosis not present

## 2017-09-27 DIAGNOSIS — I5030 Unspecified diastolic (congestive) heart failure: Secondary | ICD-10-CM | POA: Diagnosis not present

## 2017-09-27 DIAGNOSIS — I519 Heart disease, unspecified: Secondary | ICD-10-CM | POA: Diagnosis not present

## 2017-09-29 DIAGNOSIS — I5030 Unspecified diastolic (congestive) heart failure: Secondary | ICD-10-CM | POA: Diagnosis not present

## 2017-09-29 DIAGNOSIS — E1159 Type 2 diabetes mellitus with other circulatory complications: Secondary | ICD-10-CM | POA: Diagnosis not present

## 2017-09-29 DIAGNOSIS — I4891 Unspecified atrial fibrillation: Secondary | ICD-10-CM | POA: Diagnosis not present

## 2017-09-29 DIAGNOSIS — I519 Heart disease, unspecified: Secondary | ICD-10-CM | POA: Diagnosis not present

## 2017-09-29 DIAGNOSIS — N183 Chronic kidney disease, stage 3 (moderate): Secondary | ICD-10-CM | POA: Diagnosis not present

## 2017-09-29 DIAGNOSIS — I359 Nonrheumatic aortic valve disorder, unspecified: Secondary | ICD-10-CM | POA: Diagnosis not present

## 2017-09-30 DIAGNOSIS — I519 Heart disease, unspecified: Secondary | ICD-10-CM | POA: Diagnosis not present

## 2017-09-30 DIAGNOSIS — I4891 Unspecified atrial fibrillation: Secondary | ICD-10-CM | POA: Diagnosis not present

## 2017-09-30 DIAGNOSIS — R0902 Hypoxemia: Secondary | ICD-10-CM | POA: Diagnosis not present

## 2017-09-30 DIAGNOSIS — L209 Atopic dermatitis, unspecified: Secondary | ICD-10-CM | POA: Diagnosis not present

## 2017-09-30 DIAGNOSIS — Z8551 Personal history of malignant neoplasm of bladder: Secondary | ICD-10-CM | POA: Diagnosis not present

## 2017-09-30 DIAGNOSIS — E1159 Type 2 diabetes mellitus with other circulatory complications: Secondary | ICD-10-CM | POA: Diagnosis not present

## 2017-09-30 DIAGNOSIS — E039 Hypothyroidism, unspecified: Secondary | ICD-10-CM | POA: Diagnosis not present

## 2017-09-30 DIAGNOSIS — Z85038 Personal history of other malignant neoplasm of large intestine: Secondary | ICD-10-CM | POA: Diagnosis not present

## 2017-09-30 DIAGNOSIS — I359 Nonrheumatic aortic valve disorder, unspecified: Secondary | ICD-10-CM | POA: Diagnosis not present

## 2017-09-30 DIAGNOSIS — N183 Chronic kidney disease, stage 3 (moderate): Secondary | ICD-10-CM | POA: Diagnosis not present

## 2017-09-30 DIAGNOSIS — E785 Hyperlipidemia, unspecified: Secondary | ICD-10-CM | POA: Diagnosis not present

## 2017-09-30 DIAGNOSIS — I5030 Unspecified diastolic (congestive) heart failure: Secondary | ICD-10-CM | POA: Diagnosis not present

## 2017-09-30 DIAGNOSIS — Z853 Personal history of malignant neoplasm of breast: Secondary | ICD-10-CM | POA: Diagnosis not present

## 2017-09-30 DIAGNOSIS — I10 Essential (primary) hypertension: Secondary | ICD-10-CM | POA: Diagnosis not present

## 2017-10-01 DIAGNOSIS — I359 Nonrheumatic aortic valve disorder, unspecified: Secondary | ICD-10-CM | POA: Diagnosis not present

## 2017-10-01 DIAGNOSIS — N183 Chronic kidney disease, stage 3 (moderate): Secondary | ICD-10-CM | POA: Diagnosis not present

## 2017-10-01 DIAGNOSIS — E1159 Type 2 diabetes mellitus with other circulatory complications: Secondary | ICD-10-CM | POA: Diagnosis not present

## 2017-10-01 DIAGNOSIS — I519 Heart disease, unspecified: Secondary | ICD-10-CM | POA: Diagnosis not present

## 2017-10-01 DIAGNOSIS — I5030 Unspecified diastolic (congestive) heart failure: Secondary | ICD-10-CM | POA: Diagnosis not present

## 2017-10-01 DIAGNOSIS — I4891 Unspecified atrial fibrillation: Secondary | ICD-10-CM | POA: Diagnosis not present

## 2017-10-03 DIAGNOSIS — I5030 Unspecified diastolic (congestive) heart failure: Secondary | ICD-10-CM | POA: Diagnosis not present

## 2017-10-03 DIAGNOSIS — I519 Heart disease, unspecified: Secondary | ICD-10-CM | POA: Diagnosis not present

## 2017-10-03 DIAGNOSIS — N183 Chronic kidney disease, stage 3 (moderate): Secondary | ICD-10-CM | POA: Diagnosis not present

## 2017-10-03 DIAGNOSIS — I359 Nonrheumatic aortic valve disorder, unspecified: Secondary | ICD-10-CM | POA: Diagnosis not present

## 2017-10-03 DIAGNOSIS — I4891 Unspecified atrial fibrillation: Secondary | ICD-10-CM | POA: Diagnosis not present

## 2017-10-03 DIAGNOSIS — E1159 Type 2 diabetes mellitus with other circulatory complications: Secondary | ICD-10-CM | POA: Diagnosis not present

## 2017-10-04 DIAGNOSIS — I4891 Unspecified atrial fibrillation: Secondary | ICD-10-CM | POA: Diagnosis not present

## 2017-10-04 DIAGNOSIS — N183 Chronic kidney disease, stage 3 (moderate): Secondary | ICD-10-CM | POA: Diagnosis not present

## 2017-10-04 DIAGNOSIS — I5030 Unspecified diastolic (congestive) heart failure: Secondary | ICD-10-CM | POA: Diagnosis not present

## 2017-10-04 DIAGNOSIS — E1159 Type 2 diabetes mellitus with other circulatory complications: Secondary | ICD-10-CM | POA: Diagnosis not present

## 2017-10-04 DIAGNOSIS — I359 Nonrheumatic aortic valve disorder, unspecified: Secondary | ICD-10-CM | POA: Diagnosis not present

## 2017-10-04 DIAGNOSIS — I519 Heart disease, unspecified: Secondary | ICD-10-CM | POA: Diagnosis not present

## 2017-10-07 DIAGNOSIS — N183 Chronic kidney disease, stage 3 (moderate): Secondary | ICD-10-CM | POA: Diagnosis not present

## 2017-10-07 DIAGNOSIS — E1159 Type 2 diabetes mellitus with other circulatory complications: Secondary | ICD-10-CM | POA: Diagnosis not present

## 2017-10-07 DIAGNOSIS — I359 Nonrheumatic aortic valve disorder, unspecified: Secondary | ICD-10-CM | POA: Diagnosis not present

## 2017-10-07 DIAGNOSIS — I5030 Unspecified diastolic (congestive) heart failure: Secondary | ICD-10-CM | POA: Diagnosis not present

## 2017-10-07 DIAGNOSIS — I4891 Unspecified atrial fibrillation: Secondary | ICD-10-CM | POA: Diagnosis not present

## 2017-10-07 DIAGNOSIS — I519 Heart disease, unspecified: Secondary | ICD-10-CM | POA: Diagnosis not present

## 2017-10-10 DIAGNOSIS — N183 Chronic kidney disease, stage 3 (moderate): Secondary | ICD-10-CM | POA: Diagnosis not present

## 2017-10-10 DIAGNOSIS — I359 Nonrheumatic aortic valve disorder, unspecified: Secondary | ICD-10-CM | POA: Diagnosis not present

## 2017-10-10 DIAGNOSIS — I519 Heart disease, unspecified: Secondary | ICD-10-CM | POA: Diagnosis not present

## 2017-10-10 DIAGNOSIS — E1159 Type 2 diabetes mellitus with other circulatory complications: Secondary | ICD-10-CM | POA: Diagnosis not present

## 2017-10-10 DIAGNOSIS — I5030 Unspecified diastolic (congestive) heart failure: Secondary | ICD-10-CM | POA: Diagnosis not present

## 2017-10-10 DIAGNOSIS — I4891 Unspecified atrial fibrillation: Secondary | ICD-10-CM | POA: Diagnosis not present

## 2017-10-15 DIAGNOSIS — N183 Chronic kidney disease, stage 3 (moderate): Secondary | ICD-10-CM | POA: Diagnosis not present

## 2017-10-15 DIAGNOSIS — I519 Heart disease, unspecified: Secondary | ICD-10-CM | POA: Diagnosis not present

## 2017-10-15 DIAGNOSIS — I4891 Unspecified atrial fibrillation: Secondary | ICD-10-CM | POA: Diagnosis not present

## 2017-10-15 DIAGNOSIS — E1159 Type 2 diabetes mellitus with other circulatory complications: Secondary | ICD-10-CM | POA: Diagnosis not present

## 2017-10-15 DIAGNOSIS — I359 Nonrheumatic aortic valve disorder, unspecified: Secondary | ICD-10-CM | POA: Diagnosis not present

## 2017-10-15 DIAGNOSIS — I5030 Unspecified diastolic (congestive) heart failure: Secondary | ICD-10-CM | POA: Diagnosis not present

## 2017-10-16 DIAGNOSIS — I5030 Unspecified diastolic (congestive) heart failure: Secondary | ICD-10-CM | POA: Diagnosis not present

## 2017-10-16 DIAGNOSIS — I519 Heart disease, unspecified: Secondary | ICD-10-CM | POA: Diagnosis not present

## 2017-10-16 DIAGNOSIS — N183 Chronic kidney disease, stage 3 (moderate): Secondary | ICD-10-CM | POA: Diagnosis not present

## 2017-10-16 DIAGNOSIS — I359 Nonrheumatic aortic valve disorder, unspecified: Secondary | ICD-10-CM | POA: Diagnosis not present

## 2017-10-16 DIAGNOSIS — E1159 Type 2 diabetes mellitus with other circulatory complications: Secondary | ICD-10-CM | POA: Diagnosis not present

## 2017-10-16 DIAGNOSIS — I4891 Unspecified atrial fibrillation: Secondary | ICD-10-CM | POA: Diagnosis not present

## 2017-10-17 DIAGNOSIS — E1159 Type 2 diabetes mellitus with other circulatory complications: Secondary | ICD-10-CM | POA: Diagnosis not present

## 2017-10-17 DIAGNOSIS — I359 Nonrheumatic aortic valve disorder, unspecified: Secondary | ICD-10-CM | POA: Diagnosis not present

## 2017-10-17 DIAGNOSIS — I519 Heart disease, unspecified: Secondary | ICD-10-CM | POA: Diagnosis not present

## 2017-10-17 DIAGNOSIS — I5030 Unspecified diastolic (congestive) heart failure: Secondary | ICD-10-CM | POA: Diagnosis not present

## 2017-10-17 DIAGNOSIS — I4891 Unspecified atrial fibrillation: Secondary | ICD-10-CM | POA: Diagnosis not present

## 2017-10-17 DIAGNOSIS — N183 Chronic kidney disease, stage 3 (moderate): Secondary | ICD-10-CM | POA: Diagnosis not present

## 2017-10-20 DIAGNOSIS — I519 Heart disease, unspecified: Secondary | ICD-10-CM | POA: Diagnosis not present

## 2017-10-20 DIAGNOSIS — N183 Chronic kidney disease, stage 3 (moderate): Secondary | ICD-10-CM | POA: Diagnosis not present

## 2017-10-20 DIAGNOSIS — I5030 Unspecified diastolic (congestive) heart failure: Secondary | ICD-10-CM | POA: Diagnosis not present

## 2017-10-20 DIAGNOSIS — I359 Nonrheumatic aortic valve disorder, unspecified: Secondary | ICD-10-CM | POA: Diagnosis not present

## 2017-10-20 DIAGNOSIS — E1159 Type 2 diabetes mellitus with other circulatory complications: Secondary | ICD-10-CM | POA: Diagnosis not present

## 2017-10-20 DIAGNOSIS — I4891 Unspecified atrial fibrillation: Secondary | ICD-10-CM | POA: Diagnosis not present

## 2017-10-21 DIAGNOSIS — I5030 Unspecified diastolic (congestive) heart failure: Secondary | ICD-10-CM | POA: Diagnosis not present

## 2017-10-21 DIAGNOSIS — N183 Chronic kidney disease, stage 3 (moderate): Secondary | ICD-10-CM | POA: Diagnosis not present

## 2017-10-21 DIAGNOSIS — E1159 Type 2 diabetes mellitus with other circulatory complications: Secondary | ICD-10-CM | POA: Diagnosis not present

## 2017-10-21 DIAGNOSIS — I519 Heart disease, unspecified: Secondary | ICD-10-CM | POA: Diagnosis not present

## 2017-10-21 DIAGNOSIS — I359 Nonrheumatic aortic valve disorder, unspecified: Secondary | ICD-10-CM | POA: Diagnosis not present

## 2017-10-21 DIAGNOSIS — I4891 Unspecified atrial fibrillation: Secondary | ICD-10-CM | POA: Diagnosis not present

## 2017-10-24 DIAGNOSIS — I519 Heart disease, unspecified: Secondary | ICD-10-CM | POA: Diagnosis not present

## 2017-10-24 DIAGNOSIS — N183 Chronic kidney disease, stage 3 (moderate): Secondary | ICD-10-CM | POA: Diagnosis not present

## 2017-10-24 DIAGNOSIS — I5030 Unspecified diastolic (congestive) heart failure: Secondary | ICD-10-CM | POA: Diagnosis not present

## 2017-10-24 DIAGNOSIS — I359 Nonrheumatic aortic valve disorder, unspecified: Secondary | ICD-10-CM | POA: Diagnosis not present

## 2017-10-24 DIAGNOSIS — E1159 Type 2 diabetes mellitus with other circulatory complications: Secondary | ICD-10-CM | POA: Diagnosis not present

## 2017-10-24 DIAGNOSIS — I4891 Unspecified atrial fibrillation: Secondary | ICD-10-CM | POA: Diagnosis not present

## 2017-10-28 DIAGNOSIS — I359 Nonrheumatic aortic valve disorder, unspecified: Secondary | ICD-10-CM | POA: Diagnosis not present

## 2017-10-28 DIAGNOSIS — I519 Heart disease, unspecified: Secondary | ICD-10-CM | POA: Diagnosis not present

## 2017-10-28 DIAGNOSIS — I4891 Unspecified atrial fibrillation: Secondary | ICD-10-CM | POA: Diagnosis not present

## 2017-10-28 DIAGNOSIS — N183 Chronic kidney disease, stage 3 (moderate): Secondary | ICD-10-CM | POA: Diagnosis not present

## 2017-10-28 DIAGNOSIS — E1159 Type 2 diabetes mellitus with other circulatory complications: Secondary | ICD-10-CM | POA: Diagnosis not present

## 2017-10-28 DIAGNOSIS — I5030 Unspecified diastolic (congestive) heart failure: Secondary | ICD-10-CM | POA: Diagnosis not present

## 2017-10-29 ENCOUNTER — Encounter (HOSPITAL_COMMUNITY): Payer: Self-pay | Admitting: Family Medicine

## 2017-10-29 ENCOUNTER — Other Ambulatory Visit: Payer: Self-pay

## 2017-10-29 ENCOUNTER — Emergency Department (HOSPITAL_COMMUNITY)
Admission: EM | Admit: 2017-10-29 | Discharge: 2017-10-30 | Disposition: A | Discharge status: Nursing Home (Includes ICF) | Attending: Emergency Medicine | Admitting: Emergency Medicine

## 2017-10-29 DIAGNOSIS — Z853 Personal history of malignant neoplasm of breast: Secondary | ICD-10-CM | POA: Diagnosis not present

## 2017-10-29 DIAGNOSIS — Z79899 Other long term (current) drug therapy: Secondary | ICD-10-CM | POA: Insufficient documentation

## 2017-10-29 DIAGNOSIS — I359 Nonrheumatic aortic valve disorder, unspecified: Secondary | ICD-10-CM | POA: Diagnosis not present

## 2017-10-29 DIAGNOSIS — I4891 Unspecified atrial fibrillation: Secondary | ICD-10-CM | POA: Diagnosis not present

## 2017-10-29 DIAGNOSIS — Z8551 Personal history of malignant neoplasm of bladder: Secondary | ICD-10-CM | POA: Insufficient documentation

## 2017-10-29 DIAGNOSIS — Z85038 Personal history of other malignant neoplasm of large intestine: Secondary | ICD-10-CM | POA: Diagnosis not present

## 2017-10-29 DIAGNOSIS — E1159 Type 2 diabetes mellitus with other circulatory complications: Secondary | ICD-10-CM | POA: Diagnosis not present

## 2017-10-29 DIAGNOSIS — Z8541 Personal history of malignant neoplasm of cervix uteri: Secondary | ICD-10-CM | POA: Insufficient documentation

## 2017-10-29 DIAGNOSIS — Y929 Unspecified place or not applicable: Secondary | ICD-10-CM | POA: Insufficient documentation

## 2017-10-29 DIAGNOSIS — W04XXXA Fall while being carried or supported by other persons, initial encounter: Secondary | ICD-10-CM | POA: Insufficient documentation

## 2017-10-29 DIAGNOSIS — Y999 Unspecified external cause status: Secondary | ICD-10-CM | POA: Diagnosis not present

## 2017-10-29 DIAGNOSIS — E039 Hypothyroidism, unspecified: Secondary | ICD-10-CM | POA: Diagnosis not present

## 2017-10-29 DIAGNOSIS — W19XXXA Unspecified fall, initial encounter: Secondary | ICD-10-CM

## 2017-10-29 DIAGNOSIS — I110 Hypertensive heart disease with heart failure: Secondary | ICD-10-CM | POA: Insufficient documentation

## 2017-10-29 DIAGNOSIS — Y92128 Other place in nursing home as the place of occurrence of the external cause: Secondary | ICD-10-CM | POA: Diagnosis not present

## 2017-10-29 DIAGNOSIS — I5032 Chronic diastolic (congestive) heart failure: Secondary | ICD-10-CM | POA: Insufficient documentation

## 2017-10-29 DIAGNOSIS — Z87891 Personal history of nicotine dependence: Secondary | ICD-10-CM | POA: Insufficient documentation

## 2017-10-29 DIAGNOSIS — Y939 Activity, unspecified: Secondary | ICD-10-CM | POA: Insufficient documentation

## 2017-10-29 DIAGNOSIS — I5030 Unspecified diastolic (congestive) heart failure: Secondary | ICD-10-CM | POA: Diagnosis not present

## 2017-10-29 DIAGNOSIS — I519 Heart disease, unspecified: Secondary | ICD-10-CM | POA: Diagnosis not present

## 2017-10-29 DIAGNOSIS — E119 Type 2 diabetes mellitus without complications: Secondary | ICD-10-CM | POA: Diagnosis not present

## 2017-10-29 DIAGNOSIS — Z8543 Personal history of malignant neoplasm of ovary: Secondary | ICD-10-CM | POA: Insufficient documentation

## 2017-10-29 DIAGNOSIS — Z515 Encounter for palliative care: Secondary | ICD-10-CM

## 2017-10-29 DIAGNOSIS — N183 Chronic kidney disease, stage 3 (moderate): Secondary | ICD-10-CM | POA: Diagnosis not present

## 2017-10-29 DIAGNOSIS — S0101XA Laceration without foreign body of scalp, initial encounter: Secondary | ICD-10-CM | POA: Diagnosis not present

## 2017-10-29 MED ORDER — BACITRACIN ZINC 500 UNIT/GM EX OINT
TOPICAL_OINTMENT | Freq: Every day | CUTANEOUS | Status: DC
  Administered 2017-10-29: 23:00:00 via TOPICAL
  Filled 2017-10-29: qty 0.9

## 2017-10-29 NOTE — ED Notes (Signed)
Bed: XT06 Expected date:  Expected time:  Means of arrival:  Comments: EMS 84 yo female from Hospice-tripped and fell striking head- no blood thinners

## 2017-10-29 NOTE — ED Triage Notes (Signed)
Patient is from [place] on [address] and transported via [facility]. Patient had a witnessed fall and fell back on the right side of her head. Per EMS, patient has a puncture wound behind the right ear. She does have double vision but this has been ongoing for two years. No LOC or use of blood thinners. Currently, uses 2L via [place] of oxygen at home. Also, a patient of [facility] for underlying dx COPD. EMS reported she has wounds to her lower extremities that are been cared for by Hospice.

## 2017-10-29 NOTE — Discharge Instructions (Addendum)
You declined imaging today.  Your laceration was repaired with 2 staples.  Keep laceration clean with clean water, tap dry and apply thin layer of antibiotic ointment.  Should be removed in the next 7 to 10 days.  Monitor for signs of infection including swelling, redness, warmth, pus, fevers.  Recommend reevaluation by hospice nurse in the morning.

## 2017-10-29 NOTE — ED Provider Notes (Signed)
[name] DEPT Provider Note   CSN: [id number] Arrival date & time: 10/29/17  2012     History   Chief Complaint Chief Complaint  Patient presents with  . Fall    HPI 21 [name] is a 84 y.o. female with history of atrial fibrillation not on anticoagulants due to falls, COPD with chronic respiratory failure on 2 L nasal cannula, hypertension, heart failure, diabetes, cancer here for witnessed mechanical fall at nursing home.   history obtained from patient, daughter at bedside in triage note.  Reportedly, patient was being transferred from commode when she fell backwards landing on her buttocks and striking the right side of her head.  She sustained a small laceration which has stopped bleeding.  Patient reports local pain to area of laceration and mild, now resolved pain in her buttocks.  There was no LOC.  Patient is a hospice patient, daughter states she has fallen every day in the last 4 days, twice today.  She is to see hospice nurse tomorrow.  They are not interested in extensive work-up or imaging today.  No LOC, vomiting, changes in behavior or cognitive baseline, prodromal symptoms.  Patient does not ambulate. HPI  Past Medical History:  Diagnosis Date  . Bladder cancer ([place])   . Bowel obstruction ([place]) 08/25/2014  . Breast cancer ([place])   . Cervical cancer ([place])   . Chronic diastolic heart failure ([place])    a. Echo 5/12: Mild LVH, EF 55-60%, mild AI, mild MR, severe LAE, mild RAE, PASP 31, small pericardial effusion  . Colon cancer ([place])    "polyp" (12/08/2012)  . Diastolic CHF, chronic ([place]) 11/11/2012   Class 2b-3 2 d echo 5/12 mild LVH EF 55-50%, MILD AI, MILD MR, SEVERE LAE, mild RAE, PASP 31, SMALL PERICRADIAL EFFUSION  . DM2 (diabetes mellitus, type 2) ([place])    TYPE 2  . H/O ovarian cancer 11/11/2012    MUCINOUS CYST S/P RESECTION 35 YEARS AGO  . HCAP (healthcare-associated pneumonia) 07/19/2015  . HLD (hyperlipidemia)   . Hx of  bladder cancer 11/11/2012  . Hx of cervical cancer 11/11/2012  . Hypertension   . Hypothyroidism   . OSA (obstructive sleep apnea)   . Ovarian cancer ([place])   . Permanent atrial fibrillation ([facility])    a. coumadin d/c'd => Pradaxa in 05/2012  . S/P hysterectomy 11/11/2012  . SBO (small bowel obstruction) ([place])   . SBO (small bowel obstruction) ([place]) 08/25/2014  . Venous insufficiency    chronic LE edema    Patient Active Problem List   Diagnosis Date Noted  . Atrial fibrillation with RVR ([place]) 04/12/2017  . Chronic diastolic CHF (congestive heart failure) ([place]) 04/12/2017  . Gastroenteritis 04/12/2017  . Cellulitis and abscess of trunk 02/02/2017  . Cellulitis 02/02/2017  . Pressure ulcer 02/02/2017  . Severe obesity (BMI >= 40) ([place]) 09/05/2016  . Chronic venous stasis dermatitis of both lower extremities 09/05/2016  . MRSA carrier 09/05/2016  . Chronic cutaneous venous stasis ulcer ([place]) 08/03/2016  . Goals of care, counseling/discussion   . Diabetes mellitus with complication ([place])   . Bilateral leg edema 07/30/2016  . Acute respiratory failure with hypoxia ([place]) 07/30/2016  . HCAP (healthcare-associated pneumonia) 07/19/2015  . Essential hypertension 03/23/2013  . Nausea & vomiting 11/11/2012  . Hypothyroidism 11/11/2012  . Hx SBO 11/11/2012  . Colon cancer ([place]) 11/11/2012  . Acute on chronic diastolic CHF (congestive heart failure) ([place]) 11/11/2012  . Venous insufficiency 11/11/2012  . Hyperlipidemia  11/11/2012  . OSA (obstructive sleep apnea) 11/11/2012  . HX: breast cancer 11/11/2012  . Hx of cervical cancer 11/11/2012  . H/O ovarian cancer 11/11/2012  . Hx of bladder cancer 11/11/2012  . S/P hysterectomy 11/11/2012  . SBO (small bowel obstruction) ([place]) 12/06/2011  . Dehydration 12/06/2011  . Permanent atrial fibrillation ([place]) 02/17/2011    Past Surgical History:  Procedure Laterality Date  . APPENDECTOMY    . BLADDER SURGERY    . BREAST BIOPSY Bilateral   .  BREAST LUMPECTOMY Right   . CATARACT EXTRACTION W/ INTRAOCULAR LENS  IMPLANT, BILATERAL    . EXPLORATORY LAPAROTOMY WITH ABDOMINAL MASS EXCISION     "21# ovarian tumor; benign" (12/08/2012)  . I&D EXTREMITY Right 12/31/2012   Procedure: IRRIGATION AND DEBRIDEMENT RIGHT KNEE ULCER WITH PLACEMENT OF A CELL AND VAC ;  Surgeon: Theodoro Kos, DO;  Location: [facility];  Service: Plastics;  Laterality: Right;  . MASTECTOMY, RADICAL Left   . VAGINAL HYSTERECTOMY       OB History   None      Home Medications    Prior to Admission medications   Medication Sig Start Date End Date Taking? Authorizing Provider  acetaminophen (TYLENOL) 325 MG tablet Take 650 mg by mouth every 6 (six) hours as needed (for pain).    [provider]  bisacodyl (DULCOLAX) 10 MG suppository Place 10 mg rectally once a week. [facility]    [provider]  bisacodyl (DULCOLAX) 5 MG EC tablet Take 5 mg by mouth daily.     [provider]  clobetasol cream (TEMOVATE) 1.61 % Apply 1 application topically daily as needed (rash).    [provider]  cyanocobalamin (,VITAMIN B-12,) 1000 MCG/ML injection Inject 1,000 mcg into the muscle every 30 (thirty) days. GIVEN ON THE LAST DAY OF [facility]    [provider]  diltiazem (CARDIZEM CD) 120 MG 24 hr capsule Take 3 capsules (360 mg total) by mouth daily. 09/06/16   Rama, Venetia Maxon, MD  ferrous sulfate 325 (65 FE) MG tablet Take 325 mg by mouth daily with breakfast.    [provider]  furosemide (LASIX) 40 MG tablet Take 20-60 mg by mouth See admin instructions. 60 mg two times a day SCHEDULED and an additional 20 mg once a day AS NEEDED for a weight gain of 2 pounds or greater in a period of 24 hours    [provider]  levothyroxine (SYNTHROID, LEVOTHROID) 150 MCG tablet Take 150 mcg by mouth daily before breakfast.    [provider]  lisinopril (PRINIVIL,ZESTRIL) 5 MG tablet Take 5 mg by mouth daily.     [provider]  Neomycin-Bacitracin-Polymyxin (TRIPLE ANTIBIOTIC) [phone] OINT Apply 1 application topically See admin instructions. APPLY TO BILATERAL LEGS IN THE MORNING    [provider]  OXYGEN Inhale 2 L into the lungs See admin instructions. AS NEEDED WHEN AMBULATING OR WHEN EXERTED    [provider]  potassium chloride SA (K-DUR,KLOR-CON) 20 MEQ tablet Take 1 tablet (20 mEq total) by mouth 2 (two) times daily. 09/06/16   Rama, Venetia Maxon, MD  prochlorperazine (COMPAZINE) 25 MG suppository Place 25 mg rectally every 6 (six) hours as needed for vomiting.    [provider]  prochlorperazine (COMPAZINE) 5 MG tablet Take 5 mg by mouth every 6 (six) hours as needed for nausea.    [provider]  senna-docusate (SENOKOT-S) 8.6-50 MG per tablet Take 1 tablet by mouth 2 (two)  times daily. 08/31/14   Geradine Girt, DO  sodium phosphate (FLEET) 7-19 GM/118ML ENEM Place 133 mLs (1 enema total) rectally daily as needed for severe constipation. 08/31/14   Geradine Girt, DO  Sodium Phosphates (FLEET ENEMA RE) Place rectally as needed (for severe constipation).    [provider]  UNABLE TO FIND Oxygen at 2 liters nasal cannula 09/06/16   Rama, Venetia Maxon, MD  Zinc Oxide (DESITIN) 40 % PSTE Apply 1 application topically 3 (three) times daily. APPLY TO BUTTOCKS    [provider]    Family History Family History  Problem Relation Age of Onset  . Hypertension Other     Social History Social History   Tobacco Use  . Smoking status: Former Smoker    Packs/day: 3.00    Years: 25.00    Pack years: 75.00    Types: Cigarettes    Last attempt to quit: 07/02/1966    Years since quitting: 51.3  . Smokeless tobacco: Never Used  Substance Use Topics  . Alcohol use: No  . Drug use: No     Allergies   Morphine and related; Statins; Zetia [ezetimibe]; 5-alpha reductase inhibitors; Atorvastatin; Crestor [rosuvastatin calcium];  Metoprolol succinate [metoprolol]; Morphine; Nsaids; and Codeine   Review of Systems Review of Systems  Musculoskeletal: Positive for back pain.       "buttock pain"  Skin: Positive for wound.  All other systems reviewed and are negative.    Physical Exam Updated Vital Signs BP 111/61 (BP Location: Right Arm)   Pulse 77   Temp 97.7 F (36.5 C) (Oral)   Resp 15   SpO2 94%   Physical Exam  Constitutional: She is oriented to person, place, and time. She appears well-developed and well-nourished. She is cooperative. She is easily aroused. No distress.  HENT:  1 cm laceration to scalp behind right ear No facial, nasal abrasions, lacerations, erythema No facial or nasal bone tenderness No Raccoon's eyes. No Battle's sign. No hemotympanum, bilaterally. No epistaxis, septum midline No intraoral bleeding or injury  Eyes:  Lids normal EOMs and PERRL intact without pain No conjunctival injection  Neck:  No cervical spinous process tenderness No cervical paraspinal muscular tenderness Full active ROM of cervical spine Trachea midline  Cardiovascular: Normal rate, regular rhythm, S1 normal, S2 normal and normal heart sounds. Exam reveals no distant heart sounds and no friction rub.  No murmur heard. Pulses:      Carotid pulses are 2+ on the right side, and 2+ on the left side.      Radial pulses are 2+ on the right side, and 2+ on the left side.       Dorsalis pedis pulses are 2+ on the right side, and 2+ on the left side.  Pulmonary/Chest: Effort normal. No respiratory distress. She has no decreased breath sounds.  No chest wall tenderness Equal and symmetric chest wall expansion   Abdominal:  Abdomen is soft NTND  Musculoskeletal: Normal range of motion. She exhibits tenderness. She exhibits no deformity.  CT spine: no midline tenderness.  L spine: diffuse tenderness to midline and paraspinal musculature.  TTP to SI joints and buttocks.  Pelvis: no instability with AP/L  compression. No pain with hip PROM. No leg shortening or rotation.   Neurological: She is alert, oriented to person, place, and time and easily aroused.  Alert and oriented to self, place, time (day, month, year) and event.  Speech is fluent without obvious dysarthria or dysphasia. Strength  5/5 with hand grip and ankle F/E.   Sensation to light touch intact in hands and feet.Marland Kitchen  CN I and VIII not tested. CN II-XII grossly intact bilaterally.   Skin:  Pressure ulcer to buttocks, unchanged per daughter Lower extremities wrapped with dressing and coban, h/o chronic venous stasis and ulcers managed by hospice      ED Treatments / Results  Labs (all labs ordered are listed, but only abnormal results are displayed) Labs Reviewed - No data to display  EKG None  Radiology No results found.  Procedures .Marland KitchenLaceration Repair Date/Time: 10/29/2017 10:29 PM Performed by: Kinnie Feil, PA-C Authorized by: Kinnie Feil, PA-C   Consent:    Consent obtained:  Verbal   Consent given by:  Patient   Risks discussed:  Pain, poor cosmetic result, poor wound healing and infection   Alternatives discussed: alternative tx such as dermabond. Anesthesia (see MAR for exact dosages):    Anesthesia method:  None Laceration details:    Location:  Scalp   Scalp location:  R temporal   Length (cm):  1 Repair type:    Repair type:  Simple Pre-procedure details:    Preparation:  Patient was prepped and draped in usual sterile fashion Exploration:    Hemostasis achieved with:  Direct pressure   Wound exploration: entire depth of wound probed and visualized     Contaminated: no   Treatment:    Area cleansed with:  Betadine   Amount of cleaning:  Standard   Irrigation method:  Tap   Visualized foreign bodies/material removed: no   Skin repair:    Repair method:  Staples   Number of staples:  2 Approximation:    Approximation:  Loose Post-procedure details:    Dressing:  Antibiotic  ointment   Patient tolerance of procedure:  Tolerated well, no immediate complications   (including critical care time)  Medications Ordered in ED Medications  bacitracin ointment (has no administration in time range)     Initial Impression / Assessment and Plan / ED Course  I have reviewed the triage vital signs and the nursing notes.  Pertinent labs & imaging results that were available during my care of the patient were reviewed by me and considered in my medical decision making (see chart for details).     84 year old female with multiple comorbidities, recurrent falls, here for witnessed mechanical fall at [facility].  She reports local pain to area of laceration behind her right ear.  Has "buttock pain" that is improving since the fall.  No LOC.  No anticoagulants.  On exam, she is very well-appearing.  She is in good spirits and laughing with her daughter.  As noted above, laceration to right aspect of the ear and diffuse lumbar and buttock tenderness.  No ecchymosis.  She is at her baseline per her daughter.  Laceration repaired with 2 staples without difficulty.  Tetanus UTD per daughter. I recommended CT head and cervical spine as well as lumbar/hip x-rays.  Patient and daughter collectively agreed that they are not interested in imaging today.  She is a hospice patient.  I explained without imaging I cannot rule out life-threatening conditions such as intracranial bleed, fractures, unstable neck injury.  They verbalized understanding and would like to defer imaging today.  I think this is reasonable.  Will discharge with wound care instructions.  Patient deemed appropriate for discharge without other invasive or emergent lab work or imaging.  Hospice RN scheduled to see patient  tomorrow morning. Final Clinical Impressions(s) / ED Diagnoses   Final diagnoses:  Fall, initial encounter  Laceration of scalp, initial encounter  Hospice care patient    ED Discharge  Orders    None       Arlean Hopping 10/29/17 2230    Tegeler, Gwenyth Allegra, MD 10/30/17 [phone]

## 2017-10-30 DIAGNOSIS — I359 Nonrheumatic aortic valve disorder, unspecified: Secondary | ICD-10-CM | POA: Diagnosis not present

## 2017-10-30 DIAGNOSIS — L209 Atopic dermatitis, unspecified: Secondary | ICD-10-CM | POA: Diagnosis not present

## 2017-10-30 DIAGNOSIS — E785 Hyperlipidemia, unspecified: Secondary | ICD-10-CM | POA: Diagnosis not present

## 2017-10-30 DIAGNOSIS — R0902 Hypoxemia: Secondary | ICD-10-CM | POA: Diagnosis not present

## 2017-10-30 DIAGNOSIS — Z8551 Personal history of malignant neoplasm of bladder: Secondary | ICD-10-CM | POA: Diagnosis not present

## 2017-10-30 DIAGNOSIS — Z853 Personal history of malignant neoplasm of breast: Secondary | ICD-10-CM | POA: Diagnosis not present

## 2017-10-30 DIAGNOSIS — N183 Chronic kidney disease, stage 3 (moderate): Secondary | ICD-10-CM | POA: Diagnosis not present

## 2017-10-30 DIAGNOSIS — Z85038 Personal history of other malignant neoplasm of large intestine: Secondary | ICD-10-CM | POA: Diagnosis not present

## 2017-10-30 DIAGNOSIS — E039 Hypothyroidism, unspecified: Secondary | ICD-10-CM | POA: Diagnosis not present

## 2017-10-30 DIAGNOSIS — I4891 Unspecified atrial fibrillation: Secondary | ICD-10-CM | POA: Diagnosis not present

## 2017-10-30 DIAGNOSIS — I519 Heart disease, unspecified: Secondary | ICD-10-CM | POA: Diagnosis not present

## 2017-10-30 DIAGNOSIS — I5030 Unspecified diastolic (congestive) heart failure: Secondary | ICD-10-CM | POA: Diagnosis not present

## 2017-10-30 DIAGNOSIS — E1159 Type 2 diabetes mellitus with other circulatory complications: Secondary | ICD-10-CM | POA: Diagnosis not present

## 2017-10-30 DIAGNOSIS — I10 Essential (primary) hypertension: Secondary | ICD-10-CM | POA: Diagnosis not present

## 2017-10-31 DIAGNOSIS — N183 Chronic kidney disease, stage 3 (moderate): Secondary | ICD-10-CM | POA: Diagnosis not present

## 2017-10-31 DIAGNOSIS — E1159 Type 2 diabetes mellitus with other circulatory complications: Secondary | ICD-10-CM | POA: Diagnosis not present

## 2017-10-31 DIAGNOSIS — I519 Heart disease, unspecified: Secondary | ICD-10-CM | POA: Diagnosis not present

## 2017-10-31 DIAGNOSIS — I4891 Unspecified atrial fibrillation: Secondary | ICD-10-CM | POA: Diagnosis not present

## 2017-10-31 DIAGNOSIS — I359 Nonrheumatic aortic valve disorder, unspecified: Secondary | ICD-10-CM | POA: Diagnosis not present

## 2017-10-31 DIAGNOSIS — I5030 Unspecified diastolic (congestive) heart failure: Secondary | ICD-10-CM | POA: Diagnosis not present

## 2017-11-01 DIAGNOSIS — I4891 Unspecified atrial fibrillation: Secondary | ICD-10-CM | POA: Diagnosis not present

## 2017-11-01 DIAGNOSIS — N183 Chronic kidney disease, stage 3 (moderate): Secondary | ICD-10-CM | POA: Diagnosis not present

## 2017-11-01 DIAGNOSIS — E1159 Type 2 diabetes mellitus with other circulatory complications: Secondary | ICD-10-CM | POA: Diagnosis not present

## 2017-11-01 DIAGNOSIS — I359 Nonrheumatic aortic valve disorder, unspecified: Secondary | ICD-10-CM | POA: Diagnosis not present

## 2017-11-01 DIAGNOSIS — I5030 Unspecified diastolic (congestive) heart failure: Secondary | ICD-10-CM | POA: Diagnosis not present

## 2017-11-01 DIAGNOSIS — I519 Heart disease, unspecified: Secondary | ICD-10-CM | POA: Diagnosis not present

## 2017-11-04 DIAGNOSIS — I5030 Unspecified diastolic (congestive) heart failure: Secondary | ICD-10-CM | POA: Diagnosis not present

## 2017-11-04 DIAGNOSIS — I359 Nonrheumatic aortic valve disorder, unspecified: Secondary | ICD-10-CM | POA: Diagnosis not present

## 2017-11-04 DIAGNOSIS — N183 Chronic kidney disease, stage 3 (moderate): Secondary | ICD-10-CM | POA: Diagnosis not present

## 2017-11-04 DIAGNOSIS — I519 Heart disease, unspecified: Secondary | ICD-10-CM | POA: Diagnosis not present

## 2017-11-04 DIAGNOSIS — I4891 Unspecified atrial fibrillation: Secondary | ICD-10-CM | POA: Diagnosis not present

## 2017-11-04 DIAGNOSIS — E1159 Type 2 diabetes mellitus with other circulatory complications: Secondary | ICD-10-CM | POA: Diagnosis not present

## 2017-11-06 DIAGNOSIS — I359 Nonrheumatic aortic valve disorder, unspecified: Secondary | ICD-10-CM | POA: Diagnosis not present

## 2017-11-06 DIAGNOSIS — I4891 Unspecified atrial fibrillation: Secondary | ICD-10-CM | POA: Diagnosis not present

## 2017-11-06 DIAGNOSIS — E1159 Type 2 diabetes mellitus with other circulatory complications: Secondary | ICD-10-CM | POA: Diagnosis not present

## 2017-11-06 DIAGNOSIS — I519 Heart disease, unspecified: Secondary | ICD-10-CM | POA: Diagnosis not present

## 2017-11-06 DIAGNOSIS — I5030 Unspecified diastolic (congestive) heart failure: Secondary | ICD-10-CM | POA: Diagnosis not present

## 2017-11-06 DIAGNOSIS — N183 Chronic kidney disease, stage 3 (moderate): Secondary | ICD-10-CM | POA: Diagnosis not present

## 2017-11-07 DIAGNOSIS — N183 Chronic kidney disease, stage 3 (moderate): Secondary | ICD-10-CM | POA: Diagnosis not present

## 2017-11-07 DIAGNOSIS — I4891 Unspecified atrial fibrillation: Secondary | ICD-10-CM | POA: Diagnosis not present

## 2017-11-07 DIAGNOSIS — I359 Nonrheumatic aortic valve disorder, unspecified: Secondary | ICD-10-CM | POA: Diagnosis not present

## 2017-11-07 DIAGNOSIS — E1159 Type 2 diabetes mellitus with other circulatory complications: Secondary | ICD-10-CM | POA: Diagnosis not present

## 2017-11-07 DIAGNOSIS — I519 Heart disease, unspecified: Secondary | ICD-10-CM | POA: Diagnosis not present

## 2017-11-07 DIAGNOSIS — I5030 Unspecified diastolic (congestive) heart failure: Secondary | ICD-10-CM | POA: Diagnosis not present

## 2017-11-08 DIAGNOSIS — I359 Nonrheumatic aortic valve disorder, unspecified: Secondary | ICD-10-CM | POA: Diagnosis not present

## 2017-11-08 DIAGNOSIS — E1159 Type 2 diabetes mellitus with other circulatory complications: Secondary | ICD-10-CM | POA: Diagnosis not present

## 2017-11-08 DIAGNOSIS — I5030 Unspecified diastolic (congestive) heart failure: Secondary | ICD-10-CM | POA: Diagnosis not present

## 2017-11-08 DIAGNOSIS — N183 Chronic kidney disease, stage 3 (moderate): Secondary | ICD-10-CM | POA: Diagnosis not present

## 2017-11-08 DIAGNOSIS — I4891 Unspecified atrial fibrillation: Secondary | ICD-10-CM | POA: Diagnosis not present

## 2017-11-08 DIAGNOSIS — I519 Heart disease, unspecified: Secondary | ICD-10-CM | POA: Diagnosis not present

## 2017-11-09 DIAGNOSIS — E1159 Type 2 diabetes mellitus with other circulatory complications: Secondary | ICD-10-CM | POA: Diagnosis not present

## 2017-11-09 DIAGNOSIS — I359 Nonrheumatic aortic valve disorder, unspecified: Secondary | ICD-10-CM | POA: Diagnosis not present

## 2017-11-09 DIAGNOSIS — N183 Chronic kidney disease, stage 3 (moderate): Secondary | ICD-10-CM | POA: Diagnosis not present

## 2017-11-09 DIAGNOSIS — I4891 Unspecified atrial fibrillation: Secondary | ICD-10-CM | POA: Diagnosis not present

## 2017-11-09 DIAGNOSIS — I5030 Unspecified diastolic (congestive) heart failure: Secondary | ICD-10-CM | POA: Diagnosis not present

## 2017-11-09 DIAGNOSIS — I519 Heart disease, unspecified: Secondary | ICD-10-CM | POA: Diagnosis not present

## 2017-11-10 DIAGNOSIS — N183 Chronic kidney disease, stage 3 (moderate): Secondary | ICD-10-CM | POA: Diagnosis not present

## 2017-11-10 DIAGNOSIS — I519 Heart disease, unspecified: Secondary | ICD-10-CM | POA: Diagnosis not present

## 2017-11-10 DIAGNOSIS — E1159 Type 2 diabetes mellitus with other circulatory complications: Secondary | ICD-10-CM | POA: Diagnosis not present

## 2017-11-10 DIAGNOSIS — I5030 Unspecified diastolic (congestive) heart failure: Secondary | ICD-10-CM | POA: Diagnosis not present

## 2017-11-10 DIAGNOSIS — I4891 Unspecified atrial fibrillation: Secondary | ICD-10-CM | POA: Diagnosis not present

## 2017-11-10 DIAGNOSIS — I359 Nonrheumatic aortic valve disorder, unspecified: Secondary | ICD-10-CM | POA: Diagnosis not present

## 2017-11-11 DIAGNOSIS — N183 Chronic kidney disease, stage 3 (moderate): Secondary | ICD-10-CM | POA: Diagnosis not present

## 2017-11-11 DIAGNOSIS — I4891 Unspecified atrial fibrillation: Secondary | ICD-10-CM | POA: Diagnosis not present

## 2017-11-11 DIAGNOSIS — I5030 Unspecified diastolic (congestive) heart failure: Secondary | ICD-10-CM | POA: Diagnosis not present

## 2017-11-11 DIAGNOSIS — E1159 Type 2 diabetes mellitus with other circulatory complications: Secondary | ICD-10-CM | POA: Diagnosis not present

## 2017-11-11 DIAGNOSIS — I519 Heart disease, unspecified: Secondary | ICD-10-CM | POA: Diagnosis not present

## 2017-11-11 DIAGNOSIS — I359 Nonrheumatic aortic valve disorder, unspecified: Secondary | ICD-10-CM | POA: Diagnosis not present

## 2017-11-13 DIAGNOSIS — N183 Chronic kidney disease, stage 3 (moderate): Secondary | ICD-10-CM | POA: Diagnosis not present

## 2017-11-13 DIAGNOSIS — I5030 Unspecified diastolic (congestive) heart failure: Secondary | ICD-10-CM | POA: Diagnosis not present

## 2017-11-13 DIAGNOSIS — I519 Heart disease, unspecified: Secondary | ICD-10-CM | POA: Diagnosis not present

## 2017-11-13 DIAGNOSIS — E1159 Type 2 diabetes mellitus with other circulatory complications: Secondary | ICD-10-CM | POA: Diagnosis not present

## 2017-11-13 DIAGNOSIS — I359 Nonrheumatic aortic valve disorder, unspecified: Secondary | ICD-10-CM | POA: Diagnosis not present

## 2017-11-13 DIAGNOSIS — I4891 Unspecified atrial fibrillation: Secondary | ICD-10-CM | POA: Diagnosis not present

## 2017-11-14 DIAGNOSIS — E1159 Type 2 diabetes mellitus with other circulatory complications: Secondary | ICD-10-CM | POA: Diagnosis not present

## 2017-11-14 DIAGNOSIS — N183 Chronic kidney disease, stage 3 (moderate): Secondary | ICD-10-CM | POA: Diagnosis not present

## 2017-11-14 DIAGNOSIS — I5030 Unspecified diastolic (congestive) heart failure: Secondary | ICD-10-CM | POA: Diagnosis not present

## 2017-11-14 DIAGNOSIS — I4891 Unspecified atrial fibrillation: Secondary | ICD-10-CM | POA: Diagnosis not present

## 2017-11-14 DIAGNOSIS — I519 Heart disease, unspecified: Secondary | ICD-10-CM | POA: Diagnosis not present

## 2017-11-14 DIAGNOSIS — I359 Nonrheumatic aortic valve disorder, unspecified: Secondary | ICD-10-CM | POA: Diagnosis not present

## 2017-11-15 DIAGNOSIS — I359 Nonrheumatic aortic valve disorder, unspecified: Secondary | ICD-10-CM | POA: Diagnosis not present

## 2017-11-15 DIAGNOSIS — I5030 Unspecified diastolic (congestive) heart failure: Secondary | ICD-10-CM | POA: Diagnosis not present

## 2017-11-15 DIAGNOSIS — I519 Heart disease, unspecified: Secondary | ICD-10-CM | POA: Diagnosis not present

## 2017-11-15 DIAGNOSIS — E1159 Type 2 diabetes mellitus with other circulatory complications: Secondary | ICD-10-CM | POA: Diagnosis not present

## 2017-11-15 DIAGNOSIS — N183 Chronic kidney disease, stage 3 (moderate): Secondary | ICD-10-CM | POA: Diagnosis not present

## 2017-11-15 DIAGNOSIS — I4891 Unspecified atrial fibrillation: Secondary | ICD-10-CM | POA: Diagnosis not present

## 2017-11-18 DIAGNOSIS — N183 Chronic kidney disease, stage 3 (moderate): Secondary | ICD-10-CM | POA: Diagnosis not present

## 2017-11-18 DIAGNOSIS — E1159 Type 2 diabetes mellitus with other circulatory complications: Secondary | ICD-10-CM | POA: Diagnosis not present

## 2017-11-18 DIAGNOSIS — I4891 Unspecified atrial fibrillation: Secondary | ICD-10-CM | POA: Diagnosis not present

## 2017-11-18 DIAGNOSIS — I519 Heart disease, unspecified: Secondary | ICD-10-CM | POA: Diagnosis not present

## 2017-11-18 DIAGNOSIS — I359 Nonrheumatic aortic valve disorder, unspecified: Secondary | ICD-10-CM | POA: Diagnosis not present

## 2017-11-18 DIAGNOSIS — I5030 Unspecified diastolic (congestive) heart failure: Secondary | ICD-10-CM | POA: Diagnosis not present

## 2017-11-20 DIAGNOSIS — E1159 Type 2 diabetes mellitus with other circulatory complications: Secondary | ICD-10-CM | POA: Diagnosis not present

## 2017-11-20 DIAGNOSIS — I519 Heart disease, unspecified: Secondary | ICD-10-CM | POA: Diagnosis not present

## 2017-11-20 DIAGNOSIS — N183 Chronic kidney disease, stage 3 (moderate): Secondary | ICD-10-CM | POA: Diagnosis not present

## 2017-11-20 DIAGNOSIS — I4891 Unspecified atrial fibrillation: Secondary | ICD-10-CM | POA: Diagnosis not present

## 2017-11-20 DIAGNOSIS — I359 Nonrheumatic aortic valve disorder, unspecified: Secondary | ICD-10-CM | POA: Diagnosis not present

## 2017-11-20 DIAGNOSIS — I5030 Unspecified diastolic (congestive) heart failure: Secondary | ICD-10-CM | POA: Diagnosis not present

## 2017-11-21 DIAGNOSIS — I359 Nonrheumatic aortic valve disorder, unspecified: Secondary | ICD-10-CM | POA: Diagnosis not present

## 2017-11-21 DIAGNOSIS — N183 Chronic kidney disease, stage 3 (moderate): Secondary | ICD-10-CM | POA: Diagnosis not present

## 2017-11-21 DIAGNOSIS — I4891 Unspecified atrial fibrillation: Secondary | ICD-10-CM | POA: Diagnosis not present

## 2017-11-21 DIAGNOSIS — I519 Heart disease, unspecified: Secondary | ICD-10-CM | POA: Diagnosis not present

## 2017-11-21 DIAGNOSIS — I5030 Unspecified diastolic (congestive) heart failure: Secondary | ICD-10-CM | POA: Diagnosis not present

## 2017-11-21 DIAGNOSIS — E1159 Type 2 diabetes mellitus with other circulatory complications: Secondary | ICD-10-CM | POA: Diagnosis not present

## 2017-11-22 DIAGNOSIS — I4891 Unspecified atrial fibrillation: Secondary | ICD-10-CM | POA: Diagnosis not present

## 2017-11-22 DIAGNOSIS — I519 Heart disease, unspecified: Secondary | ICD-10-CM | POA: Diagnosis not present

## 2017-11-22 DIAGNOSIS — E1159 Type 2 diabetes mellitus with other circulatory complications: Secondary | ICD-10-CM | POA: Diagnosis not present

## 2017-11-22 DIAGNOSIS — N183 Chronic kidney disease, stage 3 (moderate): Secondary | ICD-10-CM | POA: Diagnosis not present

## 2017-11-22 DIAGNOSIS — I5030 Unspecified diastolic (congestive) heart failure: Secondary | ICD-10-CM | POA: Diagnosis not present

## 2017-11-22 DIAGNOSIS — I359 Nonrheumatic aortic valve disorder, unspecified: Secondary | ICD-10-CM | POA: Diagnosis not present

## 2017-11-26 DIAGNOSIS — I5030 Unspecified diastolic (congestive) heart failure: Secondary | ICD-10-CM | POA: Diagnosis not present

## 2017-11-26 DIAGNOSIS — I4891 Unspecified atrial fibrillation: Secondary | ICD-10-CM | POA: Diagnosis not present

## 2017-11-26 DIAGNOSIS — I519 Heart disease, unspecified: Secondary | ICD-10-CM | POA: Diagnosis not present

## 2017-11-26 DIAGNOSIS — E1159 Type 2 diabetes mellitus with other circulatory complications: Secondary | ICD-10-CM | POA: Diagnosis not present

## 2017-11-26 DIAGNOSIS — N183 Chronic kidney disease, stage 3 (moderate): Secondary | ICD-10-CM | POA: Diagnosis not present

## 2017-11-26 DIAGNOSIS — I359 Nonrheumatic aortic valve disorder, unspecified: Secondary | ICD-10-CM | POA: Diagnosis not present

## 2017-11-28 DIAGNOSIS — N183 Chronic kidney disease, stage 3 (moderate): Secondary | ICD-10-CM | POA: Diagnosis not present

## 2017-11-28 DIAGNOSIS — I4891 Unspecified atrial fibrillation: Secondary | ICD-10-CM | POA: Diagnosis not present

## 2017-11-28 DIAGNOSIS — E1159 Type 2 diabetes mellitus with other circulatory complications: Secondary | ICD-10-CM | POA: Diagnosis not present

## 2017-11-28 DIAGNOSIS — I519 Heart disease, unspecified: Secondary | ICD-10-CM | POA: Diagnosis not present

## 2017-11-28 DIAGNOSIS — I5030 Unspecified diastolic (congestive) heart failure: Secondary | ICD-10-CM | POA: Diagnosis not present

## 2017-11-28 DIAGNOSIS — I359 Nonrheumatic aortic valve disorder, unspecified: Secondary | ICD-10-CM | POA: Diagnosis not present

## 2017-11-29 DIAGNOSIS — I4891 Unspecified atrial fibrillation: Secondary | ICD-10-CM | POA: Diagnosis not present

## 2017-11-29 DIAGNOSIS — E1159 Type 2 diabetes mellitus with other circulatory complications: Secondary | ICD-10-CM | POA: Diagnosis not present

## 2017-11-29 DIAGNOSIS — I5030 Unspecified diastolic (congestive) heart failure: Secondary | ICD-10-CM | POA: Diagnosis not present

## 2017-11-29 DIAGNOSIS — I359 Nonrheumatic aortic valve disorder, unspecified: Secondary | ICD-10-CM | POA: Diagnosis not present

## 2017-11-29 DIAGNOSIS — R05 Cough: Secondary | ICD-10-CM | POA: Diagnosis not present

## 2017-11-29 DIAGNOSIS — N183 Chronic kidney disease, stage 3 (moderate): Secondary | ICD-10-CM | POA: Diagnosis not present

## 2017-11-29 DIAGNOSIS — I519 Heart disease, unspecified: Secondary | ICD-10-CM | POA: Diagnosis not present

## 2017-11-30 DIAGNOSIS — E785 Hyperlipidemia, unspecified: Secondary | ICD-10-CM | POA: Diagnosis not present

## 2017-11-30 DIAGNOSIS — N183 Chronic kidney disease, stage 3 (moderate): Secondary | ICD-10-CM | POA: Diagnosis not present

## 2017-11-30 DIAGNOSIS — R0902 Hypoxemia: Secondary | ICD-10-CM | POA: Diagnosis not present

## 2017-11-30 DIAGNOSIS — L209 Atopic dermatitis, unspecified: Secondary | ICD-10-CM | POA: Diagnosis not present

## 2017-11-30 DIAGNOSIS — E1159 Type 2 diabetes mellitus with other circulatory complications: Secondary | ICD-10-CM | POA: Diagnosis not present

## 2017-11-30 DIAGNOSIS — Z853 Personal history of malignant neoplasm of breast: Secondary | ICD-10-CM | POA: Diagnosis not present

## 2017-11-30 DIAGNOSIS — Z8551 Personal history of malignant neoplasm of bladder: Secondary | ICD-10-CM | POA: Diagnosis not present

## 2017-11-30 DIAGNOSIS — I10 Essential (primary) hypertension: Secondary | ICD-10-CM | POA: Diagnosis not present

## 2017-11-30 DIAGNOSIS — I519 Heart disease, unspecified: Secondary | ICD-10-CM | POA: Diagnosis not present

## 2017-11-30 DIAGNOSIS — I359 Nonrheumatic aortic valve disorder, unspecified: Secondary | ICD-10-CM | POA: Diagnosis not present

## 2017-11-30 DIAGNOSIS — E039 Hypothyroidism, unspecified: Secondary | ICD-10-CM | POA: Diagnosis not present

## 2017-11-30 DIAGNOSIS — I5030 Unspecified diastolic (congestive) heart failure: Secondary | ICD-10-CM | POA: Diagnosis not present

## 2017-11-30 DIAGNOSIS — I4891 Unspecified atrial fibrillation: Secondary | ICD-10-CM | POA: Diagnosis not present

## 2017-11-30 DIAGNOSIS — Z85038 Personal history of other malignant neoplasm of large intestine: Secondary | ICD-10-CM | POA: Diagnosis not present

## 2017-12-02 DIAGNOSIS — I4891 Unspecified atrial fibrillation: Secondary | ICD-10-CM | POA: Diagnosis not present

## 2017-12-02 DIAGNOSIS — I5030 Unspecified diastolic (congestive) heart failure: Secondary | ICD-10-CM | POA: Diagnosis not present

## 2017-12-02 DIAGNOSIS — N183 Chronic kidney disease, stage 3 (moderate): Secondary | ICD-10-CM | POA: Diagnosis not present

## 2017-12-02 DIAGNOSIS — I359 Nonrheumatic aortic valve disorder, unspecified: Secondary | ICD-10-CM | POA: Diagnosis not present

## 2017-12-02 DIAGNOSIS — E1159 Type 2 diabetes mellitus with other circulatory complications: Secondary | ICD-10-CM | POA: Diagnosis not present

## 2017-12-02 DIAGNOSIS — I519 Heart disease, unspecified: Secondary | ICD-10-CM | POA: Diagnosis not present

## 2017-12-03 DIAGNOSIS — I5030 Unspecified diastolic (congestive) heart failure: Secondary | ICD-10-CM | POA: Diagnosis not present

## 2017-12-03 DIAGNOSIS — I359 Nonrheumatic aortic valve disorder, unspecified: Secondary | ICD-10-CM | POA: Diagnosis not present

## 2017-12-03 DIAGNOSIS — I519 Heart disease, unspecified: Secondary | ICD-10-CM | POA: Diagnosis not present

## 2017-12-03 DIAGNOSIS — N183 Chronic kidney disease, stage 3 (moderate): Secondary | ICD-10-CM | POA: Diagnosis not present

## 2017-12-03 DIAGNOSIS — I4891 Unspecified atrial fibrillation: Secondary | ICD-10-CM | POA: Diagnosis not present

## 2017-12-03 DIAGNOSIS — E1159 Type 2 diabetes mellitus with other circulatory complications: Secondary | ICD-10-CM | POA: Diagnosis not present

## 2017-12-05 DIAGNOSIS — N183 Chronic kidney disease, stage 3 (moderate): Secondary | ICD-10-CM | POA: Diagnosis not present

## 2017-12-05 DIAGNOSIS — I5030 Unspecified diastolic (congestive) heart failure: Secondary | ICD-10-CM | POA: Diagnosis not present

## 2017-12-05 DIAGNOSIS — E1159 Type 2 diabetes mellitus with other circulatory complications: Secondary | ICD-10-CM | POA: Diagnosis not present

## 2017-12-05 DIAGNOSIS — I4891 Unspecified atrial fibrillation: Secondary | ICD-10-CM | POA: Diagnosis not present

## 2017-12-05 DIAGNOSIS — I359 Nonrheumatic aortic valve disorder, unspecified: Secondary | ICD-10-CM | POA: Diagnosis not present

## 2017-12-05 DIAGNOSIS — I519 Heart disease, unspecified: Secondary | ICD-10-CM | POA: Diagnosis not present

## 2017-12-06 DIAGNOSIS — E1159 Type 2 diabetes mellitus with other circulatory complications: Secondary | ICD-10-CM | POA: Diagnosis not present

## 2017-12-06 DIAGNOSIS — I4891 Unspecified atrial fibrillation: Secondary | ICD-10-CM | POA: Diagnosis not present

## 2017-12-06 DIAGNOSIS — I519 Heart disease, unspecified: Secondary | ICD-10-CM | POA: Diagnosis not present

## 2017-12-06 DIAGNOSIS — I5030 Unspecified diastolic (congestive) heart failure: Secondary | ICD-10-CM | POA: Diagnosis not present

## 2017-12-06 DIAGNOSIS — I359 Nonrheumatic aortic valve disorder, unspecified: Secondary | ICD-10-CM | POA: Diagnosis not present

## 2017-12-06 DIAGNOSIS — N183 Chronic kidney disease, stage 3 (moderate): Secondary | ICD-10-CM | POA: Diagnosis not present

## 2017-12-09 DIAGNOSIS — I4891 Unspecified atrial fibrillation: Secondary | ICD-10-CM | POA: Diagnosis not present

## 2017-12-09 DIAGNOSIS — I5030 Unspecified diastolic (congestive) heart failure: Secondary | ICD-10-CM | POA: Diagnosis not present

## 2017-12-09 DIAGNOSIS — I359 Nonrheumatic aortic valve disorder, unspecified: Secondary | ICD-10-CM | POA: Diagnosis not present

## 2017-12-09 DIAGNOSIS — E1159 Type 2 diabetes mellitus with other circulatory complications: Secondary | ICD-10-CM | POA: Diagnosis not present

## 2017-12-09 DIAGNOSIS — N183 Chronic kidney disease, stage 3 (moderate): Secondary | ICD-10-CM | POA: Diagnosis not present

## 2017-12-09 DIAGNOSIS — L603 Nail dystrophy: Secondary | ICD-10-CM | POA: Diagnosis not present

## 2017-12-09 DIAGNOSIS — Q845 Enlarged and hypertrophic nails: Secondary | ICD-10-CM | POA: Diagnosis not present

## 2017-12-09 DIAGNOSIS — I739 Peripheral vascular disease, unspecified: Secondary | ICD-10-CM | POA: Diagnosis not present

## 2017-12-09 DIAGNOSIS — I519 Heart disease, unspecified: Secondary | ICD-10-CM | POA: Diagnosis not present

## 2017-12-10 DIAGNOSIS — N183 Chronic kidney disease, stage 3 (moderate): Secondary | ICD-10-CM | POA: Diagnosis not present

## 2017-12-10 DIAGNOSIS — I519 Heart disease, unspecified: Secondary | ICD-10-CM | POA: Diagnosis not present

## 2017-12-10 DIAGNOSIS — I4891 Unspecified atrial fibrillation: Secondary | ICD-10-CM | POA: Diagnosis not present

## 2017-12-10 DIAGNOSIS — I359 Nonrheumatic aortic valve disorder, unspecified: Secondary | ICD-10-CM | POA: Diagnosis not present

## 2017-12-10 DIAGNOSIS — I5030 Unspecified diastolic (congestive) heart failure: Secondary | ICD-10-CM | POA: Diagnosis not present

## 2017-12-10 DIAGNOSIS — E1159 Type 2 diabetes mellitus with other circulatory complications: Secondary | ICD-10-CM | POA: Diagnosis not present

## 2017-12-13 DIAGNOSIS — E1159 Type 2 diabetes mellitus with other circulatory complications: Secondary | ICD-10-CM | POA: Diagnosis not present

## 2017-12-13 DIAGNOSIS — N183 Chronic kidney disease, stage 3 (moderate): Secondary | ICD-10-CM | POA: Diagnosis not present

## 2017-12-13 DIAGNOSIS — I359 Nonrheumatic aortic valve disorder, unspecified: Secondary | ICD-10-CM | POA: Diagnosis not present

## 2017-12-13 DIAGNOSIS — I5030 Unspecified diastolic (congestive) heart failure: Secondary | ICD-10-CM | POA: Diagnosis not present

## 2017-12-13 DIAGNOSIS — I519 Heart disease, unspecified: Secondary | ICD-10-CM | POA: Diagnosis not present

## 2017-12-13 DIAGNOSIS — I4891 Unspecified atrial fibrillation: Secondary | ICD-10-CM | POA: Diagnosis not present

## 2017-12-14 DIAGNOSIS — N183 Chronic kidney disease, stage 3 (moderate): Secondary | ICD-10-CM | POA: Diagnosis not present

## 2017-12-14 DIAGNOSIS — I4891 Unspecified atrial fibrillation: Secondary | ICD-10-CM | POA: Diagnosis not present

## 2017-12-14 DIAGNOSIS — E1159 Type 2 diabetes mellitus with other circulatory complications: Secondary | ICD-10-CM | POA: Diagnosis not present

## 2017-12-14 DIAGNOSIS — I5030 Unspecified diastolic (congestive) heart failure: Secondary | ICD-10-CM | POA: Diagnosis not present

## 2017-12-14 DIAGNOSIS — I359 Nonrheumatic aortic valve disorder, unspecified: Secondary | ICD-10-CM | POA: Diagnosis not present

## 2017-12-14 DIAGNOSIS — I519 Heart disease, unspecified: Secondary | ICD-10-CM | POA: Diagnosis not present

## 2017-12-16 DIAGNOSIS — I4891 Unspecified atrial fibrillation: Secondary | ICD-10-CM | POA: Diagnosis not present

## 2017-12-16 DIAGNOSIS — E1159 Type 2 diabetes mellitus with other circulatory complications: Secondary | ICD-10-CM | POA: Diagnosis not present

## 2017-12-16 DIAGNOSIS — I359 Nonrheumatic aortic valve disorder, unspecified: Secondary | ICD-10-CM | POA: Diagnosis not present

## 2017-12-16 DIAGNOSIS — N183 Chronic kidney disease, stage 3 (moderate): Secondary | ICD-10-CM | POA: Diagnosis not present

## 2017-12-16 DIAGNOSIS — I519 Heart disease, unspecified: Secondary | ICD-10-CM | POA: Diagnosis not present

## 2017-12-16 DIAGNOSIS — I5030 Unspecified diastolic (congestive) heart failure: Secondary | ICD-10-CM | POA: Diagnosis not present

## 2017-12-17 DIAGNOSIS — N183 Chronic kidney disease, stage 3 (moderate): Secondary | ICD-10-CM | POA: Diagnosis not present

## 2017-12-17 DIAGNOSIS — I5030 Unspecified diastolic (congestive) heart failure: Secondary | ICD-10-CM | POA: Diagnosis not present

## 2017-12-17 DIAGNOSIS — I519 Heart disease, unspecified: Secondary | ICD-10-CM | POA: Diagnosis not present

## 2017-12-17 DIAGNOSIS — I359 Nonrheumatic aortic valve disorder, unspecified: Secondary | ICD-10-CM | POA: Diagnosis not present

## 2017-12-17 DIAGNOSIS — E1159 Type 2 diabetes mellitus with other circulatory complications: Secondary | ICD-10-CM | POA: Diagnosis not present

## 2017-12-17 DIAGNOSIS — I4891 Unspecified atrial fibrillation: Secondary | ICD-10-CM | POA: Diagnosis not present

## 2017-12-19 DIAGNOSIS — N183 Chronic kidney disease, stage 3 (moderate): Secondary | ICD-10-CM | POA: Diagnosis not present

## 2017-12-19 DIAGNOSIS — I5030 Unspecified diastolic (congestive) heart failure: Secondary | ICD-10-CM | POA: Diagnosis not present

## 2017-12-19 DIAGNOSIS — I4891 Unspecified atrial fibrillation: Secondary | ICD-10-CM | POA: Diagnosis not present

## 2017-12-19 DIAGNOSIS — E1159 Type 2 diabetes mellitus with other circulatory complications: Secondary | ICD-10-CM | POA: Diagnosis not present

## 2017-12-19 DIAGNOSIS — I359 Nonrheumatic aortic valve disorder, unspecified: Secondary | ICD-10-CM | POA: Diagnosis not present

## 2017-12-19 DIAGNOSIS — I519 Heart disease, unspecified: Secondary | ICD-10-CM | POA: Diagnosis not present

## 2017-12-20 DIAGNOSIS — I5030 Unspecified diastolic (congestive) heart failure: Secondary | ICD-10-CM | POA: Diagnosis not present

## 2017-12-20 DIAGNOSIS — I359 Nonrheumatic aortic valve disorder, unspecified: Secondary | ICD-10-CM | POA: Diagnosis not present

## 2017-12-20 DIAGNOSIS — I4891 Unspecified atrial fibrillation: Secondary | ICD-10-CM | POA: Diagnosis not present

## 2017-12-20 DIAGNOSIS — I519 Heart disease, unspecified: Secondary | ICD-10-CM | POA: Diagnosis not present

## 2017-12-20 DIAGNOSIS — N183 Chronic kidney disease, stage 3 (moderate): Secondary | ICD-10-CM | POA: Diagnosis not present

## 2017-12-20 DIAGNOSIS — E1159 Type 2 diabetes mellitus with other circulatory complications: Secondary | ICD-10-CM | POA: Diagnosis not present

## 2017-12-22 DIAGNOSIS — E1159 Type 2 diabetes mellitus with other circulatory complications: Secondary | ICD-10-CM | POA: Diagnosis not present

## 2017-12-22 DIAGNOSIS — I4891 Unspecified atrial fibrillation: Secondary | ICD-10-CM | POA: Diagnosis not present

## 2017-12-22 DIAGNOSIS — I359 Nonrheumatic aortic valve disorder, unspecified: Secondary | ICD-10-CM | POA: Diagnosis not present

## 2017-12-22 DIAGNOSIS — I5030 Unspecified diastolic (congestive) heart failure: Secondary | ICD-10-CM | POA: Diagnosis not present

## 2017-12-22 DIAGNOSIS — N183 Chronic kidney disease, stage 3 (moderate): Secondary | ICD-10-CM | POA: Diagnosis not present

## 2017-12-22 DIAGNOSIS — I519 Heart disease, unspecified: Secondary | ICD-10-CM | POA: Diagnosis not present

## 2017-12-23 DIAGNOSIS — N183 Chronic kidney disease, stage 3 (moderate): Secondary | ICD-10-CM | POA: Diagnosis not present

## 2017-12-23 DIAGNOSIS — I5030 Unspecified diastolic (congestive) heart failure: Secondary | ICD-10-CM | POA: Diagnosis not present

## 2017-12-23 DIAGNOSIS — E1159 Type 2 diabetes mellitus with other circulatory complications: Secondary | ICD-10-CM | POA: Diagnosis not present

## 2017-12-23 DIAGNOSIS — I4891 Unspecified atrial fibrillation: Secondary | ICD-10-CM | POA: Diagnosis not present

## 2017-12-23 DIAGNOSIS — I359 Nonrheumatic aortic valve disorder, unspecified: Secondary | ICD-10-CM | POA: Diagnosis not present

## 2017-12-23 DIAGNOSIS — I519 Heart disease, unspecified: Secondary | ICD-10-CM | POA: Diagnosis not present

## 2017-12-24 DIAGNOSIS — E1159 Type 2 diabetes mellitus with other circulatory complications: Secondary | ICD-10-CM | POA: Diagnosis not present

## 2017-12-24 DIAGNOSIS — I5030 Unspecified diastolic (congestive) heart failure: Secondary | ICD-10-CM | POA: Diagnosis not present

## 2017-12-24 DIAGNOSIS — I4891 Unspecified atrial fibrillation: Secondary | ICD-10-CM | POA: Diagnosis not present

## 2017-12-24 DIAGNOSIS — I359 Nonrheumatic aortic valve disorder, unspecified: Secondary | ICD-10-CM | POA: Diagnosis not present

## 2017-12-24 DIAGNOSIS — I519 Heart disease, unspecified: Secondary | ICD-10-CM | POA: Diagnosis not present

## 2017-12-24 DIAGNOSIS — N183 Chronic kidney disease, stage 3 (moderate): Secondary | ICD-10-CM | POA: Diagnosis not present

## 2017-12-26 DIAGNOSIS — E1159 Type 2 diabetes mellitus with other circulatory complications: Secondary | ICD-10-CM | POA: Diagnosis not present

## 2017-12-26 DIAGNOSIS — I519 Heart disease, unspecified: Secondary | ICD-10-CM | POA: Diagnosis not present

## 2017-12-26 DIAGNOSIS — I4891 Unspecified atrial fibrillation: Secondary | ICD-10-CM | POA: Diagnosis not present

## 2017-12-26 DIAGNOSIS — I359 Nonrheumatic aortic valve disorder, unspecified: Secondary | ICD-10-CM | POA: Diagnosis not present

## 2017-12-26 DIAGNOSIS — I5030 Unspecified diastolic (congestive) heart failure: Secondary | ICD-10-CM | POA: Diagnosis not present

## 2017-12-26 DIAGNOSIS — N183 Chronic kidney disease, stage 3 (moderate): Secondary | ICD-10-CM | POA: Diagnosis not present

## 2017-12-27 DIAGNOSIS — N183 Chronic kidney disease, stage 3 (moderate): Secondary | ICD-10-CM | POA: Diagnosis not present

## 2017-12-27 DIAGNOSIS — I5030 Unspecified diastolic (congestive) heart failure: Secondary | ICD-10-CM | POA: Diagnosis not present

## 2017-12-27 DIAGNOSIS — I359 Nonrheumatic aortic valve disorder, unspecified: Secondary | ICD-10-CM | POA: Diagnosis not present

## 2017-12-27 DIAGNOSIS — I519 Heart disease, unspecified: Secondary | ICD-10-CM | POA: Diagnosis not present

## 2017-12-27 DIAGNOSIS — I4891 Unspecified atrial fibrillation: Secondary | ICD-10-CM | POA: Diagnosis not present

## 2017-12-27 DIAGNOSIS — E1159 Type 2 diabetes mellitus with other circulatory complications: Secondary | ICD-10-CM | POA: Diagnosis not present

## 2017-12-30 DIAGNOSIS — L209 Atopic dermatitis, unspecified: Secondary | ICD-10-CM | POA: Diagnosis not present

## 2017-12-30 DIAGNOSIS — I4891 Unspecified atrial fibrillation: Secondary | ICD-10-CM | POA: Diagnosis not present

## 2017-12-30 DIAGNOSIS — I519 Heart disease, unspecified: Secondary | ICD-10-CM | POA: Diagnosis not present

## 2017-12-30 DIAGNOSIS — E039 Hypothyroidism, unspecified: Secondary | ICD-10-CM | POA: Diagnosis not present

## 2017-12-30 DIAGNOSIS — E1159 Type 2 diabetes mellitus with other circulatory complications: Secondary | ICD-10-CM | POA: Diagnosis not present

## 2017-12-30 DIAGNOSIS — I10 Essential (primary) hypertension: Secondary | ICD-10-CM | POA: Diagnosis not present

## 2017-12-30 DIAGNOSIS — N183 Chronic kidney disease, stage 3 (moderate): Secondary | ICD-10-CM | POA: Diagnosis not present

## 2017-12-30 DIAGNOSIS — E785 Hyperlipidemia, unspecified: Secondary | ICD-10-CM | POA: Diagnosis not present

## 2017-12-30 DIAGNOSIS — R0902 Hypoxemia: Secondary | ICD-10-CM | POA: Diagnosis not present

## 2017-12-30 DIAGNOSIS — Z85038 Personal history of other malignant neoplasm of large intestine: Secondary | ICD-10-CM | POA: Diagnosis not present

## 2017-12-30 DIAGNOSIS — I5030 Unspecified diastolic (congestive) heart failure: Secondary | ICD-10-CM | POA: Diagnosis not present

## 2017-12-30 DIAGNOSIS — Z853 Personal history of malignant neoplasm of breast: Secondary | ICD-10-CM | POA: Diagnosis not present

## 2017-12-30 DIAGNOSIS — Z8551 Personal history of malignant neoplasm of bladder: Secondary | ICD-10-CM | POA: Diagnosis not present

## 2017-12-30 DIAGNOSIS — I359 Nonrheumatic aortic valve disorder, unspecified: Secondary | ICD-10-CM | POA: Diagnosis not present

## 2018-01-01 DIAGNOSIS — I359 Nonrheumatic aortic valve disorder, unspecified: Secondary | ICD-10-CM | POA: Diagnosis not present

## 2018-01-01 DIAGNOSIS — E1159 Type 2 diabetes mellitus with other circulatory complications: Secondary | ICD-10-CM | POA: Diagnosis not present

## 2018-01-01 DIAGNOSIS — I5030 Unspecified diastolic (congestive) heart failure: Secondary | ICD-10-CM | POA: Diagnosis not present

## 2018-01-01 DIAGNOSIS — N183 Chronic kidney disease, stage 3 (moderate): Secondary | ICD-10-CM | POA: Diagnosis not present

## 2018-01-01 DIAGNOSIS — I519 Heart disease, unspecified: Secondary | ICD-10-CM | POA: Diagnosis not present

## 2018-01-01 DIAGNOSIS — I4891 Unspecified atrial fibrillation: Secondary | ICD-10-CM | POA: Diagnosis not present

## 2018-01-03 DIAGNOSIS — I519 Heart disease, unspecified: Secondary | ICD-10-CM | POA: Diagnosis not present

## 2018-01-03 DIAGNOSIS — N183 Chronic kidney disease, stage 3 (moderate): Secondary | ICD-10-CM | POA: Diagnosis not present

## 2018-01-03 DIAGNOSIS — I4891 Unspecified atrial fibrillation: Secondary | ICD-10-CM | POA: Diagnosis not present

## 2018-01-03 DIAGNOSIS — I359 Nonrheumatic aortic valve disorder, unspecified: Secondary | ICD-10-CM | POA: Diagnosis not present

## 2018-01-03 DIAGNOSIS — E1159 Type 2 diabetes mellitus with other circulatory complications: Secondary | ICD-10-CM | POA: Diagnosis not present

## 2018-01-03 DIAGNOSIS — I5030 Unspecified diastolic (congestive) heart failure: Secondary | ICD-10-CM | POA: Diagnosis not present

## 2018-01-04 DIAGNOSIS — I359 Nonrheumatic aortic valve disorder, unspecified: Secondary | ICD-10-CM | POA: Diagnosis not present

## 2018-01-04 DIAGNOSIS — E1159 Type 2 diabetes mellitus with other circulatory complications: Secondary | ICD-10-CM | POA: Diagnosis not present

## 2018-01-04 DIAGNOSIS — I4891 Unspecified atrial fibrillation: Secondary | ICD-10-CM | POA: Diagnosis not present

## 2018-01-04 DIAGNOSIS — R05 Cough: Secondary | ICD-10-CM | POA: Diagnosis not present

## 2018-01-04 DIAGNOSIS — I519 Heart disease, unspecified: Secondary | ICD-10-CM | POA: Diagnosis not present

## 2018-01-04 DIAGNOSIS — N183 Chronic kidney disease, stage 3 (moderate): Secondary | ICD-10-CM | POA: Diagnosis not present

## 2018-01-04 DIAGNOSIS — I5030 Unspecified diastolic (congestive) heart failure: Secondary | ICD-10-CM | POA: Diagnosis not present

## 2018-01-06 DIAGNOSIS — I519 Heart disease, unspecified: Secondary | ICD-10-CM | POA: Diagnosis not present

## 2018-01-06 DIAGNOSIS — I4891 Unspecified atrial fibrillation: Secondary | ICD-10-CM | POA: Diagnosis not present

## 2018-01-06 DIAGNOSIS — I5030 Unspecified diastolic (congestive) heart failure: Secondary | ICD-10-CM | POA: Diagnosis not present

## 2018-01-06 DIAGNOSIS — N183 Chronic kidney disease, stage 3 (moderate): Secondary | ICD-10-CM | POA: Diagnosis not present

## 2018-01-06 DIAGNOSIS — I359 Nonrheumatic aortic valve disorder, unspecified: Secondary | ICD-10-CM | POA: Diagnosis not present

## 2018-01-06 DIAGNOSIS — E1159 Type 2 diabetes mellitus with other circulatory complications: Secondary | ICD-10-CM | POA: Diagnosis not present

## 2018-01-08 DIAGNOSIS — E1159 Type 2 diabetes mellitus with other circulatory complications: Secondary | ICD-10-CM | POA: Diagnosis not present

## 2018-01-08 DIAGNOSIS — I519 Heart disease, unspecified: Secondary | ICD-10-CM | POA: Diagnosis not present

## 2018-01-08 DIAGNOSIS — N183 Chronic kidney disease, stage 3 (moderate): Secondary | ICD-10-CM | POA: Diagnosis not present

## 2018-01-08 DIAGNOSIS — I4891 Unspecified atrial fibrillation: Secondary | ICD-10-CM | POA: Diagnosis not present

## 2018-01-08 DIAGNOSIS — I5030 Unspecified diastolic (congestive) heart failure: Secondary | ICD-10-CM | POA: Diagnosis not present

## 2018-01-08 DIAGNOSIS — I359 Nonrheumatic aortic valve disorder, unspecified: Secondary | ICD-10-CM | POA: Diagnosis not present

## 2018-01-09 DIAGNOSIS — I519 Heart disease, unspecified: Secondary | ICD-10-CM | POA: Diagnosis not present

## 2018-01-09 DIAGNOSIS — I5030 Unspecified diastolic (congestive) heart failure: Secondary | ICD-10-CM | POA: Diagnosis not present

## 2018-01-09 DIAGNOSIS — I359 Nonrheumatic aortic valve disorder, unspecified: Secondary | ICD-10-CM | POA: Diagnosis not present

## 2018-01-09 DIAGNOSIS — I4891 Unspecified atrial fibrillation: Secondary | ICD-10-CM | POA: Diagnosis not present

## 2018-01-09 DIAGNOSIS — E1159 Type 2 diabetes mellitus with other circulatory complications: Secondary | ICD-10-CM | POA: Diagnosis not present

## 2018-01-09 DIAGNOSIS — N183 Chronic kidney disease, stage 3 (moderate): Secondary | ICD-10-CM | POA: Diagnosis not present

## 2018-01-10 DIAGNOSIS — E1159 Type 2 diabetes mellitus with other circulatory complications: Secondary | ICD-10-CM | POA: Diagnosis not present

## 2018-01-10 DIAGNOSIS — N183 Chronic kidney disease, stage 3 (moderate): Secondary | ICD-10-CM | POA: Diagnosis not present

## 2018-01-10 DIAGNOSIS — I4891 Unspecified atrial fibrillation: Secondary | ICD-10-CM | POA: Diagnosis not present

## 2018-01-10 DIAGNOSIS — I5030 Unspecified diastolic (congestive) heart failure: Secondary | ICD-10-CM | POA: Diagnosis not present

## 2018-01-10 DIAGNOSIS — I359 Nonrheumatic aortic valve disorder, unspecified: Secondary | ICD-10-CM | POA: Diagnosis not present

## 2018-01-10 DIAGNOSIS — I519 Heart disease, unspecified: Secondary | ICD-10-CM | POA: Diagnosis not present

## 2018-01-11 DIAGNOSIS — I359 Nonrheumatic aortic valve disorder, unspecified: Secondary | ICD-10-CM | POA: Diagnosis not present

## 2018-01-11 DIAGNOSIS — I4891 Unspecified atrial fibrillation: Secondary | ICD-10-CM | POA: Diagnosis not present

## 2018-01-11 DIAGNOSIS — N183 Chronic kidney disease, stage 3 (moderate): Secondary | ICD-10-CM | POA: Diagnosis not present

## 2018-01-11 DIAGNOSIS — I5030 Unspecified diastolic (congestive) heart failure: Secondary | ICD-10-CM | POA: Diagnosis not present

## 2018-01-11 DIAGNOSIS — E1159 Type 2 diabetes mellitus with other circulatory complications: Secondary | ICD-10-CM | POA: Diagnosis not present

## 2018-01-11 DIAGNOSIS — I519 Heart disease, unspecified: Secondary | ICD-10-CM | POA: Diagnosis not present

## 2018-01-13 DIAGNOSIS — E1159 Type 2 diabetes mellitus with other circulatory complications: Secondary | ICD-10-CM | POA: Diagnosis not present

## 2018-01-13 DIAGNOSIS — N183 Chronic kidney disease, stage 3 (moderate): Secondary | ICD-10-CM | POA: Diagnosis not present

## 2018-01-13 DIAGNOSIS — I359 Nonrheumatic aortic valve disorder, unspecified: Secondary | ICD-10-CM | POA: Diagnosis not present

## 2018-01-13 DIAGNOSIS — I519 Heart disease, unspecified: Secondary | ICD-10-CM | POA: Diagnosis not present

## 2018-01-13 DIAGNOSIS — I5030 Unspecified diastolic (congestive) heart failure: Secondary | ICD-10-CM | POA: Diagnosis not present

## 2018-01-13 DIAGNOSIS — I4891 Unspecified atrial fibrillation: Secondary | ICD-10-CM | POA: Diagnosis not present

## 2018-01-15 DIAGNOSIS — E1159 Type 2 diabetes mellitus with other circulatory complications: Secondary | ICD-10-CM | POA: Diagnosis not present

## 2018-01-15 DIAGNOSIS — I519 Heart disease, unspecified: Secondary | ICD-10-CM | POA: Diagnosis not present

## 2018-01-15 DIAGNOSIS — I4891 Unspecified atrial fibrillation: Secondary | ICD-10-CM | POA: Diagnosis not present

## 2018-01-15 DIAGNOSIS — I359 Nonrheumatic aortic valve disorder, unspecified: Secondary | ICD-10-CM | POA: Diagnosis not present

## 2018-01-15 DIAGNOSIS — I5030 Unspecified diastolic (congestive) heart failure: Secondary | ICD-10-CM | POA: Diagnosis not present

## 2018-01-15 DIAGNOSIS — N183 Chronic kidney disease, stage 3 (moderate): Secondary | ICD-10-CM | POA: Diagnosis not present

## 2018-01-16 DIAGNOSIS — N183 Chronic kidney disease, stage 3 (moderate): Secondary | ICD-10-CM | POA: Diagnosis not present

## 2018-01-16 DIAGNOSIS — I4891 Unspecified atrial fibrillation: Secondary | ICD-10-CM | POA: Diagnosis not present

## 2018-01-16 DIAGNOSIS — I5030 Unspecified diastolic (congestive) heart failure: Secondary | ICD-10-CM | POA: Diagnosis not present

## 2018-01-16 DIAGNOSIS — I519 Heart disease, unspecified: Secondary | ICD-10-CM | POA: Diagnosis not present

## 2018-01-16 DIAGNOSIS — E1159 Type 2 diabetes mellitus with other circulatory complications: Secondary | ICD-10-CM | POA: Diagnosis not present

## 2018-01-16 DIAGNOSIS — I359 Nonrheumatic aortic valve disorder, unspecified: Secondary | ICD-10-CM | POA: Diagnosis not present

## 2018-01-17 DIAGNOSIS — N183 Chronic kidney disease, stage 3 (moderate): Secondary | ICD-10-CM | POA: Diagnosis not present

## 2018-01-17 DIAGNOSIS — I5030 Unspecified diastolic (congestive) heart failure: Secondary | ICD-10-CM | POA: Diagnosis not present

## 2018-01-17 DIAGNOSIS — I359 Nonrheumatic aortic valve disorder, unspecified: Secondary | ICD-10-CM | POA: Diagnosis not present

## 2018-01-17 DIAGNOSIS — E1159 Type 2 diabetes mellitus with other circulatory complications: Secondary | ICD-10-CM | POA: Diagnosis not present

## 2018-01-17 DIAGNOSIS — I519 Heart disease, unspecified: Secondary | ICD-10-CM | POA: Diagnosis not present

## 2018-01-17 DIAGNOSIS — I4891 Unspecified atrial fibrillation: Secondary | ICD-10-CM | POA: Diagnosis not present

## 2018-01-18 DIAGNOSIS — I4891 Unspecified atrial fibrillation: Secondary | ICD-10-CM | POA: Diagnosis not present

## 2018-01-18 DIAGNOSIS — I5030 Unspecified diastolic (congestive) heart failure: Secondary | ICD-10-CM | POA: Diagnosis not present

## 2018-01-18 DIAGNOSIS — E1159 Type 2 diabetes mellitus with other circulatory complications: Secondary | ICD-10-CM | POA: Diagnosis not present

## 2018-01-18 DIAGNOSIS — I359 Nonrheumatic aortic valve disorder, unspecified: Secondary | ICD-10-CM | POA: Diagnosis not present

## 2018-01-18 DIAGNOSIS — N183 Chronic kidney disease, stage 3 (moderate): Secondary | ICD-10-CM | POA: Diagnosis not present

## 2018-01-18 DIAGNOSIS — I519 Heart disease, unspecified: Secondary | ICD-10-CM | POA: Diagnosis not present

## 2018-01-20 DIAGNOSIS — I519 Heart disease, unspecified: Secondary | ICD-10-CM | POA: Diagnosis not present

## 2018-01-20 DIAGNOSIS — N183 Chronic kidney disease, stage 3 (moderate): Secondary | ICD-10-CM | POA: Diagnosis not present

## 2018-01-20 DIAGNOSIS — I4891 Unspecified atrial fibrillation: Secondary | ICD-10-CM | POA: Diagnosis not present

## 2018-01-20 DIAGNOSIS — I5030 Unspecified diastolic (congestive) heart failure: Secondary | ICD-10-CM | POA: Diagnosis not present

## 2018-01-20 DIAGNOSIS — I359 Nonrheumatic aortic valve disorder, unspecified: Secondary | ICD-10-CM | POA: Diagnosis not present

## 2018-01-20 DIAGNOSIS — E1159 Type 2 diabetes mellitus with other circulatory complications: Secondary | ICD-10-CM | POA: Diagnosis not present

## 2018-01-21 DIAGNOSIS — E1159 Type 2 diabetes mellitus with other circulatory complications: Secondary | ICD-10-CM | POA: Diagnosis not present

## 2018-01-21 DIAGNOSIS — I4891 Unspecified atrial fibrillation: Secondary | ICD-10-CM | POA: Diagnosis not present

## 2018-01-21 DIAGNOSIS — I359 Nonrheumatic aortic valve disorder, unspecified: Secondary | ICD-10-CM | POA: Diagnosis not present

## 2018-01-21 DIAGNOSIS — I519 Heart disease, unspecified: Secondary | ICD-10-CM | POA: Diagnosis not present

## 2018-01-21 DIAGNOSIS — N183 Chronic kidney disease, stage 3 (moderate): Secondary | ICD-10-CM | POA: Diagnosis not present

## 2018-01-21 DIAGNOSIS — I5030 Unspecified diastolic (congestive) heart failure: Secondary | ICD-10-CM | POA: Diagnosis not present

## 2018-01-22 DIAGNOSIS — I359 Nonrheumatic aortic valve disorder, unspecified: Secondary | ICD-10-CM | POA: Diagnosis not present

## 2018-01-22 DIAGNOSIS — I519 Heart disease, unspecified: Secondary | ICD-10-CM | POA: Diagnosis not present

## 2018-01-22 DIAGNOSIS — I5030 Unspecified diastolic (congestive) heart failure: Secondary | ICD-10-CM | POA: Diagnosis not present

## 2018-01-22 DIAGNOSIS — E1159 Type 2 diabetes mellitus with other circulatory complications: Secondary | ICD-10-CM | POA: Diagnosis not present

## 2018-01-22 DIAGNOSIS — N183 Chronic kidney disease, stage 3 (moderate): Secondary | ICD-10-CM | POA: Diagnosis not present

## 2018-01-22 DIAGNOSIS — I4891 Unspecified atrial fibrillation: Secondary | ICD-10-CM | POA: Diagnosis not present

## 2018-01-23 DIAGNOSIS — E1159 Type 2 diabetes mellitus with other circulatory complications: Secondary | ICD-10-CM | POA: Diagnosis not present

## 2018-01-23 DIAGNOSIS — I359 Nonrheumatic aortic valve disorder, unspecified: Secondary | ICD-10-CM | POA: Diagnosis not present

## 2018-01-23 DIAGNOSIS — N183 Chronic kidney disease, stage 3 (moderate): Secondary | ICD-10-CM | POA: Diagnosis not present

## 2018-01-23 DIAGNOSIS — I4891 Unspecified atrial fibrillation: Secondary | ICD-10-CM | POA: Diagnosis not present

## 2018-01-23 DIAGNOSIS — I5030 Unspecified diastolic (congestive) heart failure: Secondary | ICD-10-CM | POA: Diagnosis not present

## 2018-01-23 DIAGNOSIS — I519 Heart disease, unspecified: Secondary | ICD-10-CM | POA: Diagnosis not present

## 2018-01-24 DIAGNOSIS — N183 Chronic kidney disease, stage 3 (moderate): Secondary | ICD-10-CM | POA: Diagnosis not present

## 2018-01-24 DIAGNOSIS — E1159 Type 2 diabetes mellitus with other circulatory complications: Secondary | ICD-10-CM | POA: Diagnosis not present

## 2018-01-24 DIAGNOSIS — I5030 Unspecified diastolic (congestive) heart failure: Secondary | ICD-10-CM | POA: Diagnosis not present

## 2018-01-24 DIAGNOSIS — I519 Heart disease, unspecified: Secondary | ICD-10-CM | POA: Diagnosis not present

## 2018-01-24 DIAGNOSIS — I4891 Unspecified atrial fibrillation: Secondary | ICD-10-CM | POA: Diagnosis not present

## 2018-01-24 DIAGNOSIS — I359 Nonrheumatic aortic valve disorder, unspecified: Secondary | ICD-10-CM | POA: Diagnosis not present

## 2018-01-25 DIAGNOSIS — E1159 Type 2 diabetes mellitus with other circulatory complications: Secondary | ICD-10-CM | POA: Diagnosis not present

## 2018-01-25 DIAGNOSIS — I5030 Unspecified diastolic (congestive) heart failure: Secondary | ICD-10-CM | POA: Diagnosis not present

## 2018-01-25 DIAGNOSIS — I359 Nonrheumatic aortic valve disorder, unspecified: Secondary | ICD-10-CM | POA: Diagnosis not present

## 2018-01-25 DIAGNOSIS — I4891 Unspecified atrial fibrillation: Secondary | ICD-10-CM | POA: Diagnosis not present

## 2018-01-25 DIAGNOSIS — I519 Heart disease, unspecified: Secondary | ICD-10-CM | POA: Diagnosis not present

## 2018-01-25 DIAGNOSIS — N183 Chronic kidney disease, stage 3 (moderate): Secondary | ICD-10-CM | POA: Diagnosis not present

## 2018-01-27 DIAGNOSIS — I5030 Unspecified diastolic (congestive) heart failure: Secondary | ICD-10-CM | POA: Diagnosis not present

## 2018-01-27 DIAGNOSIS — I4891 Unspecified atrial fibrillation: Secondary | ICD-10-CM | POA: Diagnosis not present

## 2018-01-27 DIAGNOSIS — I359 Nonrheumatic aortic valve disorder, unspecified: Secondary | ICD-10-CM | POA: Diagnosis not present

## 2018-01-27 DIAGNOSIS — E1159 Type 2 diabetes mellitus with other circulatory complications: Secondary | ICD-10-CM | POA: Diagnosis not present

## 2018-01-27 DIAGNOSIS — I519 Heart disease, unspecified: Secondary | ICD-10-CM | POA: Diagnosis not present

## 2018-01-27 DIAGNOSIS — N183 Chronic kidney disease, stage 3 (moderate): Secondary | ICD-10-CM | POA: Diagnosis not present

## 2018-01-28 DIAGNOSIS — N183 Chronic kidney disease, stage 3 (moderate): Secondary | ICD-10-CM | POA: Diagnosis not present

## 2018-01-28 DIAGNOSIS — I359 Nonrheumatic aortic valve disorder, unspecified: Secondary | ICD-10-CM | POA: Diagnosis not present

## 2018-01-28 DIAGNOSIS — I5030 Unspecified diastolic (congestive) heart failure: Secondary | ICD-10-CM | POA: Diagnosis not present

## 2018-01-28 DIAGNOSIS — I519 Heart disease, unspecified: Secondary | ICD-10-CM | POA: Diagnosis not present

## 2018-01-28 DIAGNOSIS — I4891 Unspecified atrial fibrillation: Secondary | ICD-10-CM | POA: Diagnosis not present

## 2018-01-28 DIAGNOSIS — E1159 Type 2 diabetes mellitus with other circulatory complications: Secondary | ICD-10-CM | POA: Diagnosis not present

## 2018-01-29 DIAGNOSIS — E1159 Type 2 diabetes mellitus with other circulatory complications: Secondary | ICD-10-CM | POA: Diagnosis not present

## 2018-01-29 DIAGNOSIS — N183 Chronic kidney disease, stage 3 (moderate): Secondary | ICD-10-CM | POA: Diagnosis not present

## 2018-01-29 DIAGNOSIS — I359 Nonrheumatic aortic valve disorder, unspecified: Secondary | ICD-10-CM | POA: Diagnosis not present

## 2018-01-29 DIAGNOSIS — I5030 Unspecified diastolic (congestive) heart failure: Secondary | ICD-10-CM | POA: Diagnosis not present

## 2018-01-29 DIAGNOSIS — I4891 Unspecified atrial fibrillation: Secondary | ICD-10-CM | POA: Diagnosis not present

## 2018-01-29 DIAGNOSIS — I519 Heart disease, unspecified: Secondary | ICD-10-CM | POA: Diagnosis not present

## 2018-01-30 DEATH — deceased

## 2018-11-04 IMAGING — CR DG CHEST 1V PORT
1 series · 1 of 1 positions shown · non-contrast
Comparison: Chest x-ray of July 19, 2015 and chest CT scan of
the same date.

CLINICAL DATA: Cough and shortness of breath

EXAM:
PORTABLE CHEST 1 VIEW

[AP]
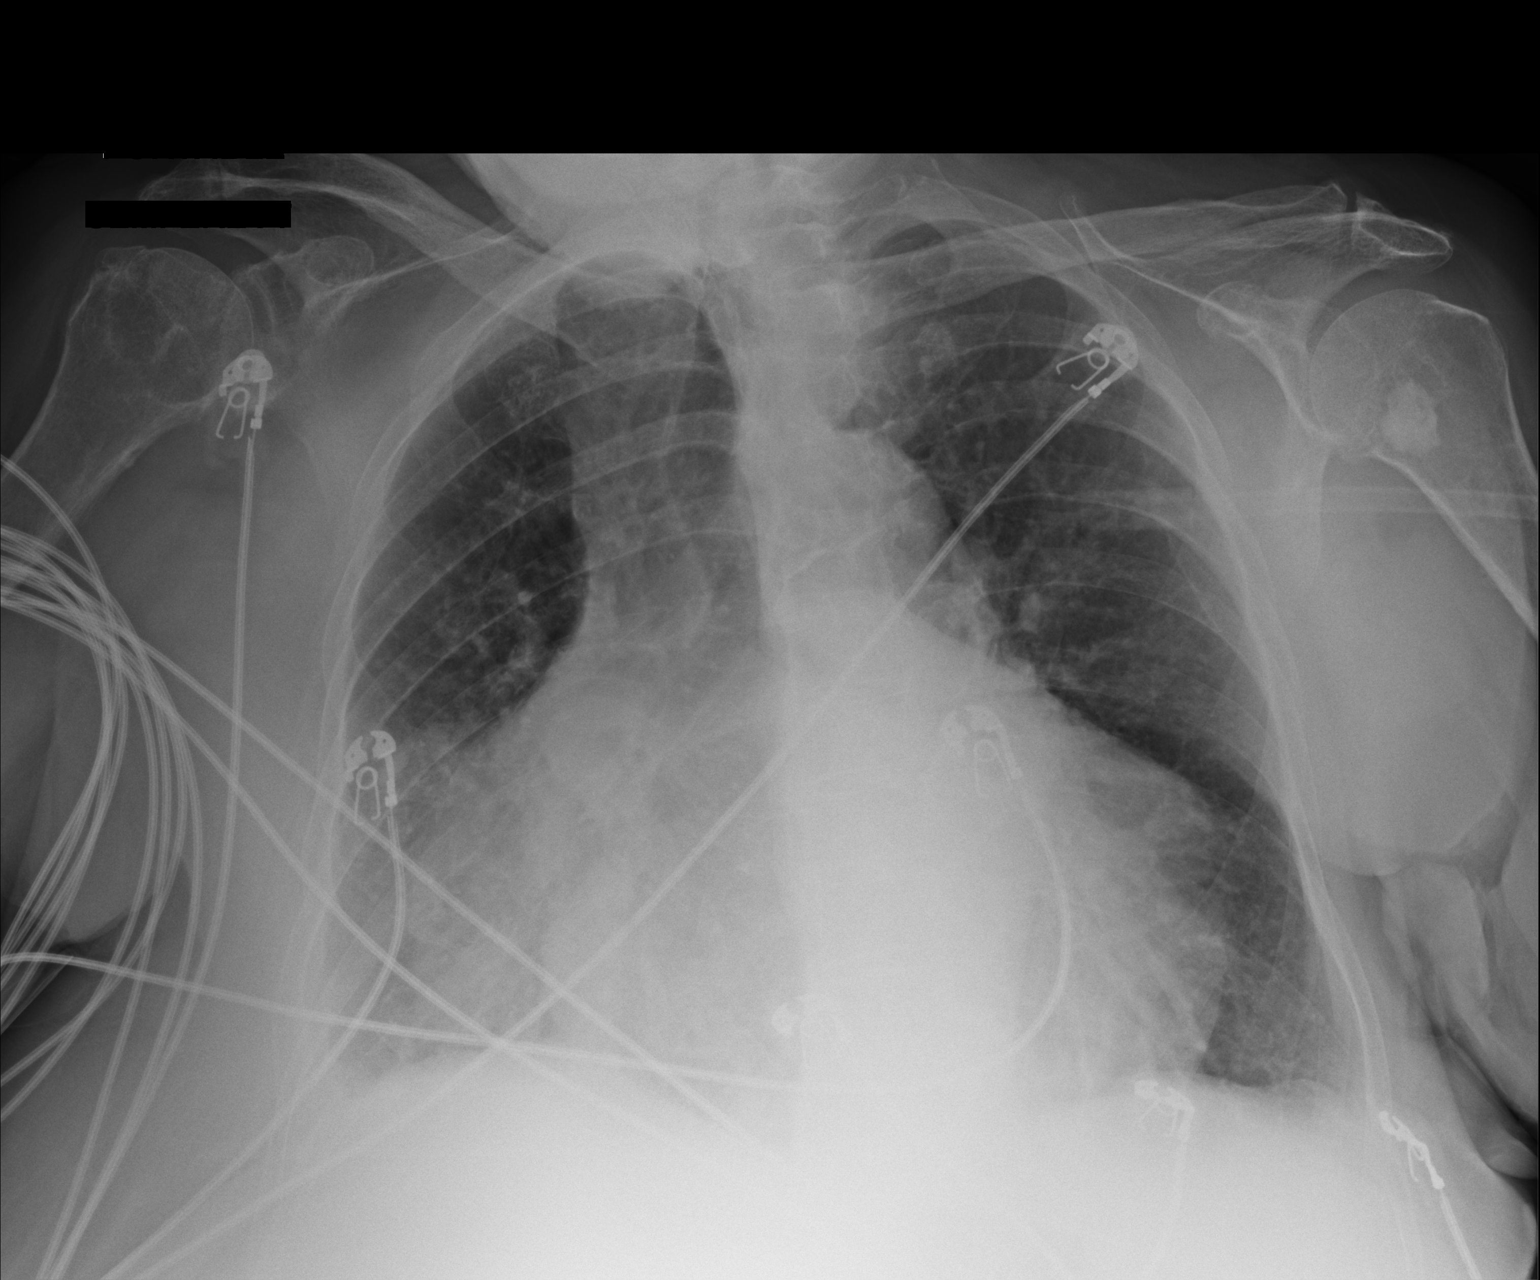

[1 of 1 positions shown; findings below may reference images not displayed]

FINDINGS: The lungs are well-expanded. There is increased density obscuring
the right heart border more conspicuous than on the previous study.
The interstitial markings elsewhere are coarse. There is no
significant pleural effusion. The cardiac silhouette remains
enlarged. The pulmonary vascularity is not engorged.
IMPRESSION: Abnormal density in the right lower hemithorax more conspicuous than
in the past. The right heart border is obscured. This may reflect
postobstructive atelectasis or pneumonia. Repeat chest CT scanning
is recommended in an effort to exclude malignancy.

## 2018-11-05 IMAGING — CT CT CHEST W/O CM
2 of 3 series · 15 of 36 positions shown, 18 images · non-contrast
Comparison: 07/19/2015; chest radiograph 07/30/2016

CLINICAL DATA: Cough, RIGHT lung atelectasis

EXAM:
CT CHEST WITHOUT CONTRAST
TECHNIQUE: Multidetector CT imaging of the chest was performed following the
standard protocol without IV contrast. Sagittal and coronal MPR
images reconstructed from axial data set.

[Series 2: chest w/o 2mm st · axial · non-contrast · 0.70mm/px · z∈[+1269,+1521]mm · 12 of 150 slices shown, 15 images]
[im 12/150  mediastinal]
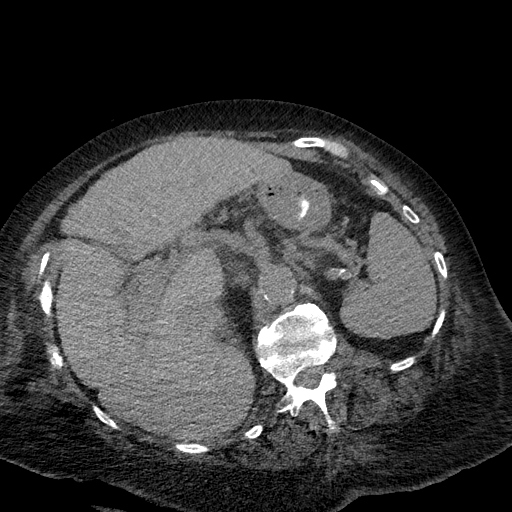
[im 12/150  lung]
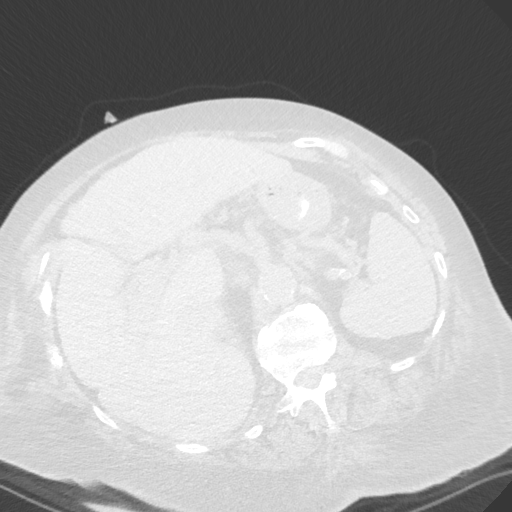
[im 23/150  lung]
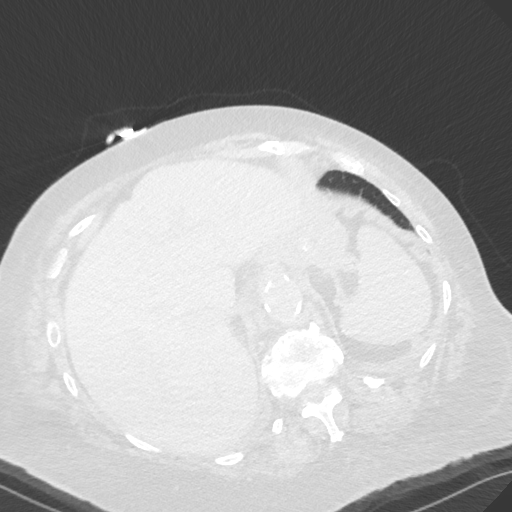
[im 34/150  lung]
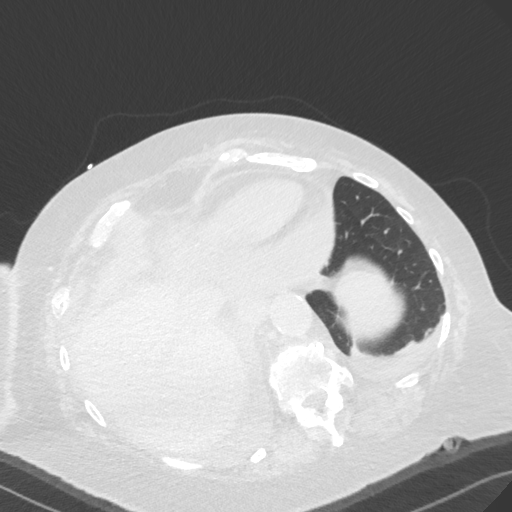
[im 45/150  lung]
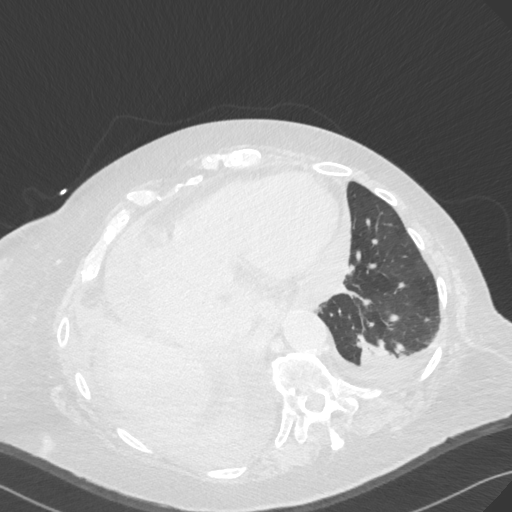
[im 56/150  mediastinal]
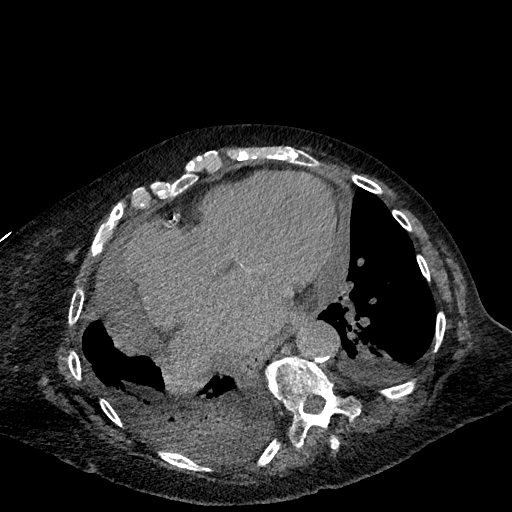
[im 56/150  lung]
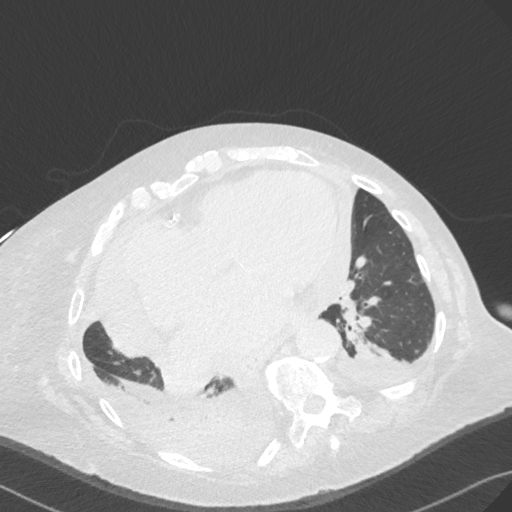
[im 67/150  lung]
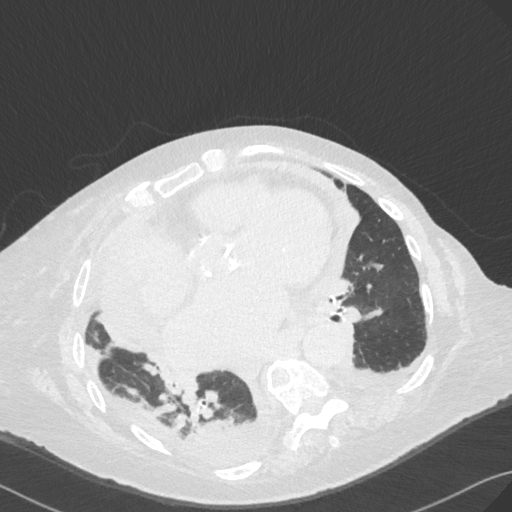
[im 83/150  lung]
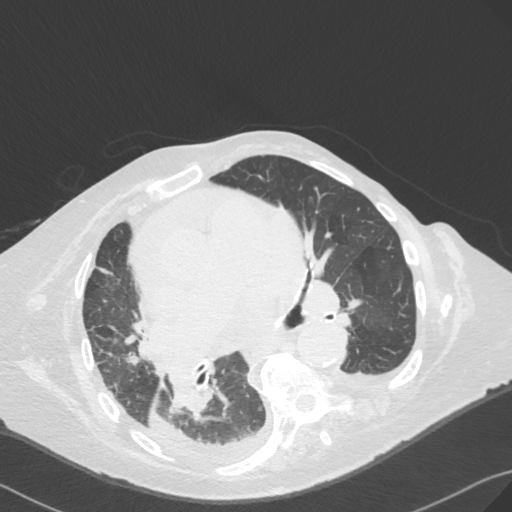
[im 94/150  lung]
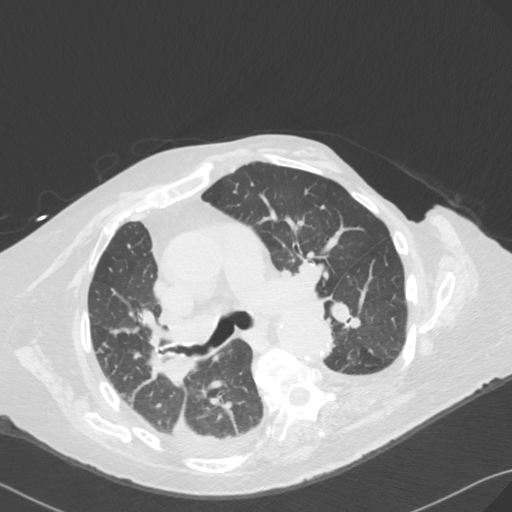
[im 105/150  mediastinal]
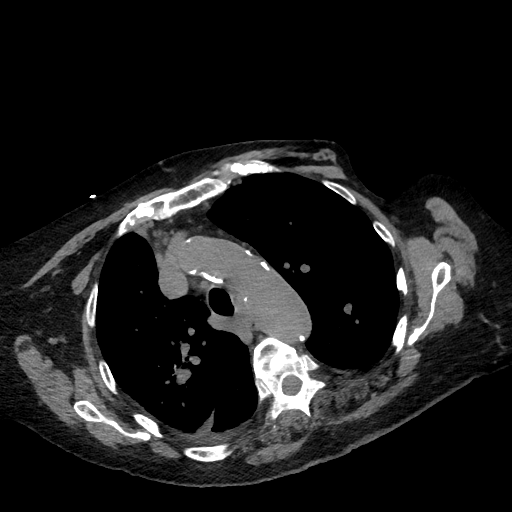
[im 105/150  lung]
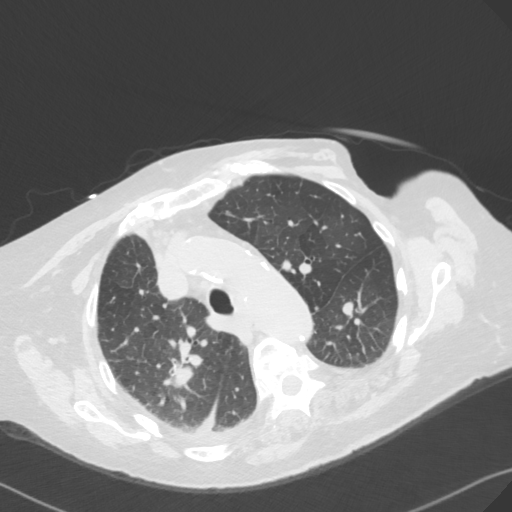
[im 116/150  lung]
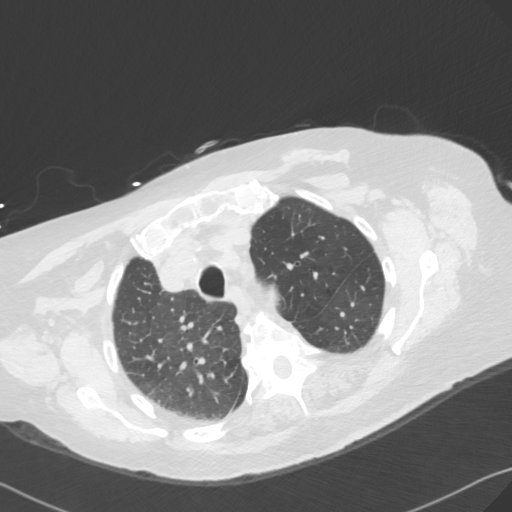
[im 127/150  lung]
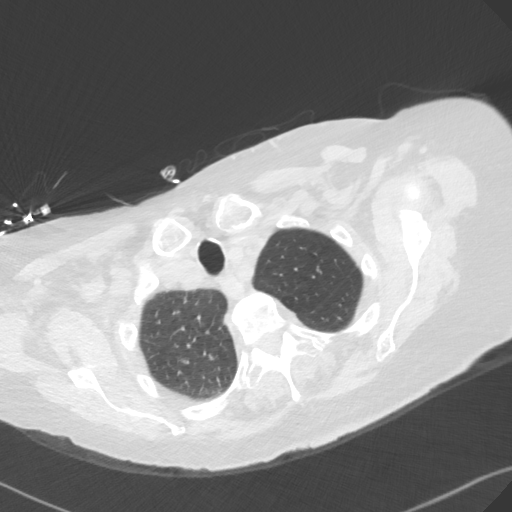
[im 138/150  lung]
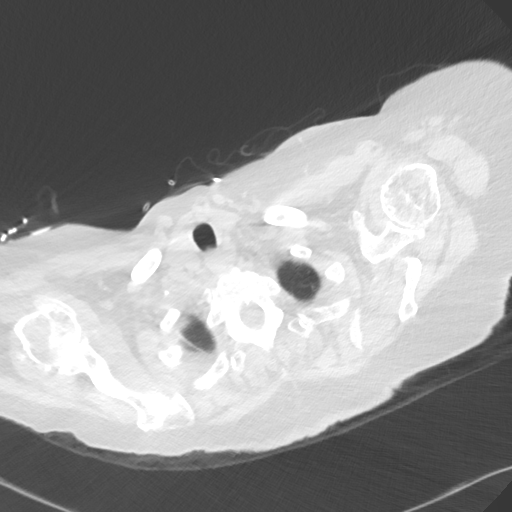

[Series 4: chest w/o 3mm st cor · coronal · non-contrast · 0.59mm/px · 3 of 88 slices shown]
[im 18/88  lung]
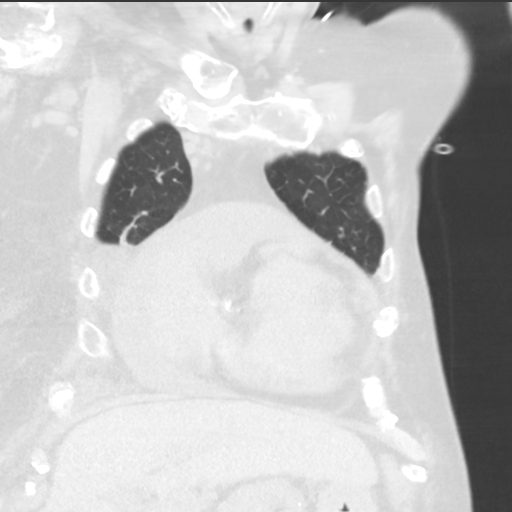
[im 35/88  lung]
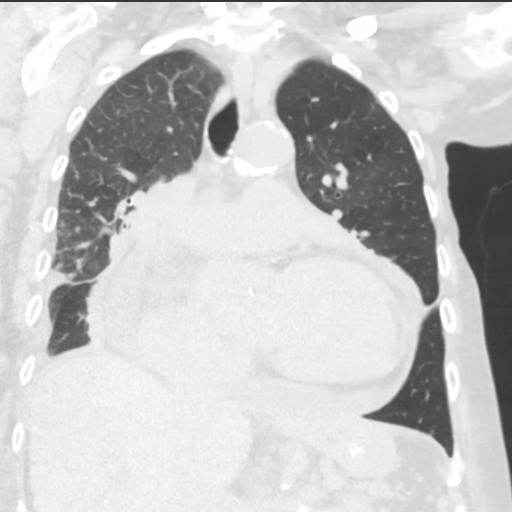
[im 53/88  lung]
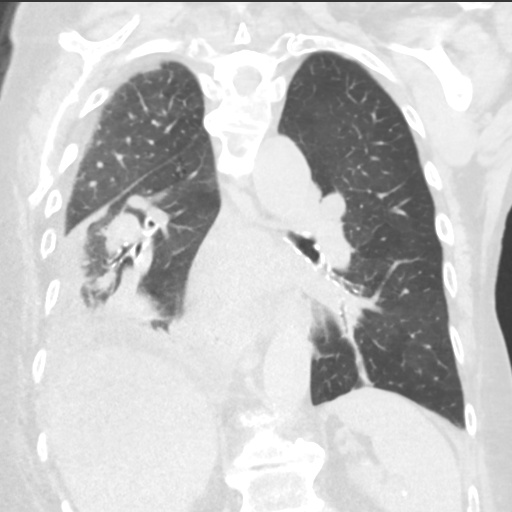

[15 of 36 positions shown; findings below may reference images not displayed]

FINDINGS: Cardiovascular: Atherosclerotic calcifications aorta, proximal great
vessels and coronary arteries. Moderate pericardial effusion.
Enlargement of cardiac chambers. Pulmonary vascular congestion and
dilatation of central pulmonary arteries. Normal caliber thoracic
aorta.

Mediastinum/Nodes: Unremarkable esophagus. No thoracic adenopathy,
with scattered normal sized mediastinal lymph nodes noted. Base of
cervical region unremarkable.

Lungs/Pleura: Small BILATERAL pleural effusions larger on RIGHT.
Peribronchial thickening. Scattered atelectasis in BILATERAL lower
lobes. No acute infiltrate or pneumothorax.

Upper Abdomen: Cirrhotic liver.  Otherwise unremarkable.

Musculoskeletal: Scattered degenerative changes thoracic spine.
Several bone islands lower thoracic spine stable. Diffuse osseous
demineralization.
IMPRESSION: Enlargement of cardiac chambers with moderate pericardial effusion.

Suspect pulmonary arterial hypertension.

Aortic atherosclerosis and coronary arterial calcifications.

BILATERAL pleural effusions with scattered atelectasis in the lower
lobes bilaterally.

Cirrhotic liver.
